# Patient Record
Sex: Female | Born: 1935 | Race: Black or African American | Hispanic: No | Smoking: Current some day smoker
Health system: Southern US, Community
[De-identification: ages and names within clinical notes are randomized; demographics above are authoritative.]

## PROBLEM LIST (undated history)

## (undated) DIAGNOSIS — J449 Chronic obstructive pulmonary disease, unspecified: Secondary | ICD-10-CM

## (undated) DIAGNOSIS — I2510 Atherosclerotic heart disease of native coronary artery without angina pectoris: Secondary | ICD-10-CM

## (undated) DIAGNOSIS — D649 Anemia, unspecified: Secondary | ICD-10-CM

## (undated) DIAGNOSIS — I10 Essential (primary) hypertension: Secondary | ICD-10-CM

## (undated) DIAGNOSIS — Z950 Presence of cardiac pacemaker: Secondary | ICD-10-CM

## (undated) DIAGNOSIS — C3490 Malignant neoplasm of unspecified part of unspecified bronchus or lung: Secondary | ICD-10-CM

## (undated) DIAGNOSIS — I5022 Chronic systolic (congestive) heart failure: Secondary | ICD-10-CM

## (undated) DIAGNOSIS — C801 Malignant (primary) neoplasm, unspecified: Secondary | ICD-10-CM

## (undated) DIAGNOSIS — I480 Paroxysmal atrial fibrillation: Secondary | ICD-10-CM

## (undated) DIAGNOSIS — E039 Hypothyroidism, unspecified: Secondary | ICD-10-CM

## (undated) DIAGNOSIS — E119 Type 2 diabetes mellitus without complications: Secondary | ICD-10-CM

## (undated) DIAGNOSIS — R9431 Abnormal electrocardiogram [ECG] [EKG]: Secondary | ICD-10-CM

## (undated) HISTORY — DX: Abnormal electrocardiogram (ECG) (EKG): R94.31

## (undated) HISTORY — PX: PACEMAKER GENERATOR CHANGE: SHX5998

## (undated) HISTORY — DX: Chronic systolic (congestive) heart failure: I50.22

## (undated) HISTORY — PX: HERNIA REPAIR: SHX51

---

## 1988-10-01 HISTORY — PX: CORONARY ANGIOPLASTY WITH STENT PLACEMENT: SHX49

## 1989-10-01 HISTORY — PX: PACEMAKER INSERTION: SHX728

## 1998-01-13 ENCOUNTER — Ambulatory Visit (HOSPITAL_COMMUNITY): Admission: RE | Admit: 1998-01-13 | Discharge: 1998-01-13 | Payer: Self-pay | Admitting: Internal Medicine

## 1998-03-07 ENCOUNTER — Inpatient Hospital Stay (HOSPITAL_COMMUNITY): Admission: EM | Admit: 1998-03-07 | Discharge: 1998-03-11 | Payer: Self-pay | Admitting: Emergency Medicine

## 1998-04-05 ENCOUNTER — Encounter: Admission: RE | Admit: 1998-04-05 | Discharge: 1998-04-05 | Payer: Self-pay | Admitting: Obstetrics & Gynecology

## 1998-04-05 ENCOUNTER — Other Ambulatory Visit: Admission: RE | Admit: 1998-04-05 | Discharge: 1998-04-05 | Payer: Self-pay | Admitting: Obstetrics

## 1998-04-19 ENCOUNTER — Other Ambulatory Visit: Admission: RE | Admit: 1998-04-19 | Discharge: 1998-04-19 | Payer: Self-pay | Admitting: Obstetrics

## 1998-04-19 ENCOUNTER — Encounter: Admission: RE | Admit: 1998-04-19 | Discharge: 1998-04-19 | Payer: Self-pay | Admitting: Obstetrics & Gynecology

## 1998-07-18 ENCOUNTER — Ambulatory Visit (HOSPITAL_COMMUNITY): Admission: RE | Admit: 1998-07-18 | Discharge: 1998-07-18 | Payer: Self-pay | Admitting: Internal Medicine

## 1998-11-14 ENCOUNTER — Other Ambulatory Visit: Admission: RE | Admit: 1998-11-14 | Discharge: 1998-11-14 | Payer: Self-pay

## 1998-11-21 ENCOUNTER — Ambulatory Visit (HOSPITAL_COMMUNITY): Admission: RE | Admit: 1998-11-21 | Discharge: 1998-11-21 | Payer: Self-pay | Admitting: *Deleted

## 1998-12-20 ENCOUNTER — Encounter: Payer: Self-pay | Admitting: Obstetrics and Gynecology

## 1998-12-22 ENCOUNTER — Ambulatory Visit (HOSPITAL_COMMUNITY): Admission: RE | Admit: 1998-12-22 | Discharge: 1998-12-22 | Payer: Self-pay | Admitting: Obstetrics and Gynecology

## 1999-08-02 ENCOUNTER — Encounter: Admission: RE | Admit: 1999-08-02 | Discharge: 1999-10-31 | Payer: Self-pay | Admitting: Internal Medicine

## 2000-04-18 ENCOUNTER — Emergency Department (HOSPITAL_COMMUNITY): Admission: EM | Admit: 2000-04-18 | Discharge: 2000-04-18 | Payer: Self-pay | Admitting: Emergency Medicine

## 2000-04-18 ENCOUNTER — Encounter: Payer: Self-pay | Admitting: Emergency Medicine

## 2001-04-14 ENCOUNTER — Other Ambulatory Visit: Admission: RE | Admit: 2001-04-14 | Discharge: 2001-04-14 | Payer: Self-pay | Admitting: Internal Medicine

## 2001-12-17 ENCOUNTER — Encounter: Payer: Self-pay | Admitting: Internal Medicine

## 2001-12-17 ENCOUNTER — Encounter: Admission: RE | Admit: 2001-12-17 | Discharge: 2001-12-17 | Payer: Self-pay | Admitting: Internal Medicine

## 2002-03-26 ENCOUNTER — Inpatient Hospital Stay (HOSPITAL_COMMUNITY): Admission: EM | Admit: 2002-03-26 | Discharge: 2002-04-01 | Payer: Self-pay | Admitting: Emergency Medicine

## 2002-03-26 ENCOUNTER — Encounter: Payer: Self-pay | Admitting: Emergency Medicine

## 2002-03-26 ENCOUNTER — Encounter (INDEPENDENT_AMBULATORY_CARE_PROVIDER_SITE_OTHER): Payer: Self-pay | Admitting: Specialist

## 2002-03-26 ENCOUNTER — Encounter: Payer: Self-pay | Admitting: Surgery

## 2002-03-28 ENCOUNTER — Encounter: Payer: Self-pay | Admitting: Surgery

## 2002-09-14 ENCOUNTER — Encounter: Payer: Self-pay | Admitting: Emergency Medicine

## 2002-09-14 ENCOUNTER — Inpatient Hospital Stay (HOSPITAL_COMMUNITY): Admission: EM | Admit: 2002-09-14 | Discharge: 2002-09-16 | Payer: Self-pay | Admitting: Emergency Medicine

## 2002-10-09 ENCOUNTER — Ambulatory Visit (HOSPITAL_COMMUNITY): Admission: RE | Admit: 2002-10-09 | Discharge: 2002-10-09 | Payer: Self-pay | Admitting: Cardiology

## 2002-11-24 ENCOUNTER — Ambulatory Visit (HOSPITAL_COMMUNITY): Admission: RE | Admit: 2002-11-24 | Discharge: 2002-11-24 | Payer: Self-pay | Admitting: General Surgery

## 2002-12-25 ENCOUNTER — Inpatient Hospital Stay (HOSPITAL_COMMUNITY): Admission: RE | Admit: 2002-12-25 | Discharge: 2002-12-27 | Payer: Self-pay | Admitting: General Surgery

## 2002-12-26 ENCOUNTER — Encounter: Payer: Self-pay | Admitting: General Surgery

## 2003-12-01 ENCOUNTER — Observation Stay (HOSPITAL_COMMUNITY): Admission: EM | Admit: 2003-12-01 | Discharge: 2003-12-02 | Payer: Self-pay | Admitting: Emergency Medicine

## 2005-07-28 ENCOUNTER — Emergency Department (HOSPITAL_COMMUNITY): Admission: EM | Admit: 2005-07-28 | Discharge: 2005-07-28 | Payer: Self-pay | Admitting: Family Medicine

## 2005-10-19 ENCOUNTER — Emergency Department (HOSPITAL_COMMUNITY): Admission: EM | Admit: 2005-10-19 | Discharge: 2005-10-19 | Payer: Self-pay | Admitting: Family Medicine

## 2006-04-30 ENCOUNTER — Encounter: Admission: RE | Admit: 2006-04-30 | Discharge: 2006-04-30 | Payer: Self-pay | Admitting: Internal Medicine

## 2006-08-30 ENCOUNTER — Emergency Department (HOSPITAL_COMMUNITY): Admission: EM | Admit: 2006-08-30 | Discharge: 2006-08-30 | Payer: Self-pay | Admitting: Family Medicine

## 2007-10-22 ENCOUNTER — Encounter: Admission: RE | Admit: 2007-10-22 | Discharge: 2007-10-22 | Payer: Self-pay | Admitting: Internal Medicine

## 2007-10-28 ENCOUNTER — Encounter: Admission: RE | Admit: 2007-10-28 | Discharge: 2007-10-28 | Payer: Self-pay | Admitting: Internal Medicine

## 2007-10-31 ENCOUNTER — Inpatient Hospital Stay (HOSPITAL_COMMUNITY): Admission: EM | Admit: 2007-10-31 | Discharge: 2007-11-04 | Payer: Self-pay | Admitting: Emergency Medicine

## 2008-12-09 ENCOUNTER — Encounter: Admission: RE | Admit: 2008-12-09 | Discharge: 2008-12-09 | Payer: Self-pay | Admitting: Internal Medicine

## 2009-01-22 ENCOUNTER — Emergency Department (HOSPITAL_COMMUNITY): Admission: EM | Admit: 2009-01-22 | Discharge: 2009-01-22 | Payer: Self-pay | Admitting: Family Medicine

## 2009-05-13 ENCOUNTER — Encounter: Admission: RE | Admit: 2009-05-13 | Discharge: 2009-05-13 | Payer: Self-pay | Admitting: Internal Medicine

## 2009-10-31 ENCOUNTER — Emergency Department (HOSPITAL_COMMUNITY): Admission: EM | Admit: 2009-10-31 | Discharge: 2009-10-31 | Payer: Self-pay | Admitting: Family Medicine

## 2010-03-11 ENCOUNTER — Emergency Department (HOSPITAL_COMMUNITY): Admission: EM | Admit: 2010-03-11 | Discharge: 2010-03-11 | Payer: Self-pay | Admitting: Emergency Medicine

## 2010-03-12 ENCOUNTER — Observation Stay (HOSPITAL_COMMUNITY): Admission: EM | Admit: 2010-03-12 | Discharge: 2010-03-14 | Payer: Self-pay | Admitting: Emergency Medicine

## 2010-03-23 ENCOUNTER — Ambulatory Visit: Payer: Self-pay | Admitting: Pulmonary Disease

## 2010-03-23 DIAGNOSIS — J984 Other disorders of lung: Secondary | ICD-10-CM | POA: Insufficient documentation

## 2010-03-23 DIAGNOSIS — E039 Hypothyroidism, unspecified: Secondary | ICD-10-CM | POA: Insufficient documentation

## 2010-03-23 DIAGNOSIS — I2510 Atherosclerotic heart disease of native coronary artery without angina pectoris: Secondary | ICD-10-CM | POA: Insufficient documentation

## 2010-03-23 DIAGNOSIS — I4821 Permanent atrial fibrillation: Secondary | ICD-10-CM

## 2010-03-23 DIAGNOSIS — I480 Paroxysmal atrial fibrillation: Secondary | ICD-10-CM | POA: Insufficient documentation

## 2010-03-23 DIAGNOSIS — K219 Gastro-esophageal reflux disease without esophagitis: Secondary | ICD-10-CM | POA: Insufficient documentation

## 2010-03-23 DIAGNOSIS — J449 Chronic obstructive pulmonary disease, unspecified: Secondary | ICD-10-CM | POA: Insufficient documentation

## 2010-03-23 HISTORY — DX: Hypothyroidism, unspecified: E03.9

## 2010-03-23 HISTORY — DX: Paroxysmal atrial fibrillation: I48.0

## 2010-03-23 HISTORY — DX: Permanent atrial fibrillation: I48.21

## 2010-06-26 ENCOUNTER — Emergency Department (HOSPITAL_COMMUNITY): Admission: EM | Admit: 2010-06-26 | Discharge: 2010-06-26 | Payer: Self-pay | Admitting: Emergency Medicine

## 2010-07-13 ENCOUNTER — Ambulatory Visit: Payer: Self-pay | Admitting: Internal Medicine

## 2010-10-01 HISTORY — PX: CHOLECYSTECTOMY: SHX55

## 2010-10-22 ENCOUNTER — Encounter: Payer: Self-pay | Admitting: Internal Medicine

## 2010-11-02 NOTE — Assessment & Plan Note (Signed)
Summary: [facility]   Visit Type:  Initial Consult Copy to:  [place], ED   History of Present Illness: 74/F, smoker for evaluation of pulmonary nodules. She presented to [facility] twice with left axillary chest pain, heavy , non radiating. She had angioplasty in '97 & reported pain ot be similar to then.On second occassion admitted ,cath neg, Ct angio showed moderate emphysema & 4mm nodule in the anterior RUL & 5 mm nodule in RML.  She denies fecvers, wt loss, loss of appetite. Per daughter, she is dyspneic on walking small distances.She reports a chest cold last year requirign nebuliser Rx & steroid shot. She ahs stsarted on chantix & cut down to 4 cigs /d ay int he last week. Meds reviewed  Preventive Screening-Counseling & Management  Alcohol-Tobacco     Smoking Status: current     Smoking Cessation Counseling: yes     Smoke Cessation Stage: contemplative     Packs/Day: 1.0  Problems Prior to Update: 1)  Hypothyroidism  (ICD-244.9) 2)  G E R D  (ICD-530.81) 3)  Diabetes, Type 2  (ICD-250.00) 4)  Coronary Heart Disease  (ICD-414.00) 5)  Hx of Atrial Fibrillation  (ICD-427.31)  Current Medications (verified): 1)  None  Allergies (verified): No Known Drug Allergies    Past History:  Past Medical History: HYPOTHYROIDISM (ICD-244.9) G E R D (ICD-530.81) DIABETES, TYPE 2 (ICD-250.00) CORONARY HEART DISEASE (ICD-414.00) Hx of ATRIAL FIBRILLATION (ICD-427.31)  Past Surgical History: Cholecystectomy Pacemaker:  hernia repair  Family History: Family History Coronary Heart Disease  Social History: Patient is a current smoker.  lives wih daughter  denies ETOH or drug useSmoking Status:  current Packs/Day:  1.0  Review of Systems       The patient complains of shortness of breath with activity, non-productive cough, and chest pain.  The patient denies shortness of breath at rest, productive cough, coughing up blood, irregular heartbeats, acid heartburn, indigestion,  loss of appetite, weight change, abdominal pain, difficulty swallowing, sore throat, tooth/dental problems, headaches, nasal congestion/difficulty breathing through nose, sneezing, itching, ear ache, anxiety, depression, hand/feet swelling, joint stiffness or pain, rash, change in color of mucus, and fever.    Vital Signs:  Patient profile:   76 year old female Height:      62 inches Weight:      147 pounds BMI:     26.98 O2 Sat:      96 % on Room air Pulse rate:   82 / minute Pulse rhythm:   regular Resp:     18 per minute BP sitting:   138 / 77  O2 Flow:  Room air  Physical Exam  Additional Exam:  Gen. Pleasant, well-nourished, in no distress, normal affect ENT - no lesions, no post nasal drip Neck: No JVD, no thyromegaly, no carotid bruits Lungs: no use of accessory muscles, no dullness to percussion, clear without rales or rhonchi  Cardiovascular: Rhythm regular, heart sounds  normal, no murmurs or gallops, no peripheral edema Abdomen: soft and non-tender, no hepatosplenomegaly, BS normal. Musculoskeletal: No deformities, no cyanosis or clubbing Neuro:  alert, non focal     Impression & Recommendations:  Problem # 1:  PULMONARY NODULE, RIGHT UPPER LOBE (ICD-518.89)  discussed need for serial FU x 2 years or until resolution. Next CT in 4 mnths  Orders: Radiology Referral (Radiology) Consultation Level IV (16109)  Problem # 2:  C O P D (ICD-496) PFTs   Problem # 3:  TOBACCO ABUSE (ICD-305.1)  clearly smoking cessation  paramount here - has started on chantix & cut down to 4 cigs/ day. encouraged her on the quit attempt  Orders: Consultation Level IV (16109)  Other Orders: Pulmonary Referral (Pulmonary)  Patient Instructions: 1)  Copy sent to:Dr Hank smith 2)  You have been asked to make an appointment for a Pulmonary Function Test (Breathing Test) prior to or at the time of your next visit. Use medications as usual unless otherwise instructed. 3)  A Chest CT  WITHOUT Contrast has been recommended. Your imaging study may require preauthorization.

## 2010-12-18 LAB — POCT I-STAT, CHEM 8
BUN: 20 mg/dL (ref 6–23)
Chloride: 107 mEq/L (ref 96–112)
Creatinine, Ser: 0.7 mg/dL (ref 0.4–1.2)
Creatinine, Ser: 0.7 mg/dL (ref 0.4–1.2)
Glucose, Bld: 157 mg/dL — ABNORMAL HIGH (ref 70–99)
Hemoglobin: 12.9 g/dL (ref 12.0–15.0)
Potassium: 3.9 mEq/L (ref 3.5–5.1)
Sodium: 141 mEq/L (ref 135–145)
Sodium: 142 mEq/L (ref 135–145)
TCO2: 26 mmol/L (ref 0–100)

## 2010-12-18 LAB — COMPREHENSIVE METABOLIC PANEL
ALT: 28 U/L (ref 0–35)
AST: 22 U/L (ref 0–37)
Alkaline Phosphatase: 89 U/L (ref 39–117)
BUN: 13 mg/dL (ref 6–23)
CO2: 27 mEq/L (ref 19–32)
Calcium: 9 mg/dL (ref 8.4–10.5)
Chloride: 108 mEq/L (ref 96–112)
GFR calc Af Amer: >60 mL/min (ref >60)
GFR calc non Af Amer: >60 mL/min (ref >60)
Glucose, Bld: 118 mg/dL — ABNORMAL HIGH (ref 70–99)
Glucose, Bld: 153 mg/dL — ABNORMAL HIGH (ref 70–99)
Potassium: 3.9 mEq/L (ref 3.5–5.1)
Sodium: 140 mEq/L (ref 135–145)
Total Bilirubin: 0.3 mg/dL (ref 0.3–1.2)
Total Protein: 6.4 g/dL (ref 6.0–8.3)

## 2010-12-18 LAB — BASIC METABOLIC PANEL
BUN: 13 mg/dL (ref 6–23)
BUN: 15 mg/dL (ref 6–23)
Calcium: 9.5 mg/dL (ref 8.4–10.5)
Calcium: 9.6 mg/dL (ref 8.4–10.5)
GFR calc non Af Amer: >60 mL/min (ref >60)
GFR calc non Af Amer: >60 mL/min (ref >60)
Glucose, Bld: 142 mg/dL — ABNORMAL HIGH (ref 70–99)
Glucose, Bld: 208 mg/dL — ABNORMAL HIGH (ref 70–99)
Potassium: 3.9 mEq/L (ref 3.5–5.1)
Potassium: 3.9 mEq/L (ref 3.5–5.1)
Sodium: 138 mEq/L (ref 135–145)
Sodium: 140 mEq/L (ref 135–145)

## 2010-12-18 LAB — POCT CARDIAC MARKERS
CKMB, poc: 3.3 ng/mL (ref 1.0–8.0)
CKMB, poc: 3.7 ng/mL (ref 1.0–8.0)
Myoglobin, poc: 210 ng/mL (ref 12–200)
Myoglobin, poc: 88.7 ng/mL (ref 12–200)
Myoglobin, poc: 98.1 ng/mL (ref 12–200)
Troponin i, poc: <0.05 ng/mL (ref 0.00–0.09)

## 2010-12-18 LAB — CBC
HCT: 36 % (ref 36.0–46.0)
Hemoglobin: 12.4 g/dL (ref 12.0–15.0)
MCHC: 34 g/dL (ref 30.0–36.0)
MCHC: 34 g/dL (ref 30.0–36.0)
MCV: 89.2 fL (ref 78.0–100.0)
Platelets: 175 10*3/uL (ref 150–400)
Platelets: 195 10*3/uL (ref 150–400)
RBC: 4.11 MIL/uL (ref 3.87–5.11)
RDW: 14.3 % (ref 11.5–15.5)
RDW: 14.4 % (ref 11.5–15.5)
WBC: 4.8 10*3/uL (ref 4.0–10.5)
WBC: 5.3 10*3/uL (ref 4.0–10.5)
WBC: 6.1 10*3/uL (ref 4.0–10.5)

## 2010-12-18 LAB — CK TOTAL AND CKMB (NOT AT ARMC)
CK, MB: 3.9 ng/mL (ref 0.3–4.0)
Total CK: 131 U/L (ref 7–177)

## 2010-12-18 LAB — LIPID PANEL
Cholesterol: 190 mg/dL (ref 0–200)
HDL: 45 mg/dL (ref >39)

## 2010-12-18 LAB — CARDIAC PANEL(CRET KIN+CKTOT+MB+TROPI)
CK, MB: 3.4 ng/mL (ref 0.3–4.0)
Relative Index: 3.1 — ABNORMAL HIGH (ref 0.0–2.5)
Relative Index: 3.2 — ABNORMAL HIGH (ref 0.0–2.5)
Total CK: 108 U/L (ref 7–177)

## 2010-12-18 LAB — GLUCOSE, CAPILLARY
Glucose-Capillary: 119 mg/dL — ABNORMAL HIGH (ref 70–99)
Glucose-Capillary: 126 mg/dL — ABNORMAL HIGH (ref 70–99)
Glucose-Capillary: 131 mg/dL — ABNORMAL HIGH (ref 70–99)
Glucose-Capillary: 131 mg/dL — ABNORMAL HIGH (ref 70–99)
Glucose-Capillary: 140 mg/dL — ABNORMAL HIGH (ref 70–99)
Glucose-Capillary: 265 mg/dL — ABNORMAL HIGH (ref 70–99)

## 2010-12-18 LAB — HEMOGLOBIN A1C
Hgb A1c MFr Bld: 8 % — ABNORMAL HIGH (ref <5.7)
Mean Plasma Glucose: 183 mg/dL — ABNORMAL HIGH (ref <117)

## 2010-12-18 LAB — DIFFERENTIAL
Basophils Absolute: 0 10*3/uL (ref 0.0–0.1)
Basophils Relative: 1 % (ref 0–1)
Eosinophils Absolute: 0.1 10*3/uL (ref 0.0–0.7)
Eosinophils Relative: 2 % (ref 0–5)
Lymphocytes Relative: 44 % (ref 12–46)
Lymphs Abs: 2.3 10*3/uL (ref 0.7–4.0)
Lymphs Abs: 2.7 10*3/uL (ref 0.7–4.0)
Neutro Abs: 2.6 10*3/uL (ref 1.7–7.7)

## 2010-12-18 LAB — PROTIME-INR: INR: 0.93 (ref 0.00–1.49)

## 2010-12-18 LAB — APTT: aPTT: 32 seconds (ref 24–37)

## 2011-02-13 NOTE — H&P (Signed)
NAMEFOYE, HAGGART NO.:  [id number]   MEDICAL RECORD NO.:  [id number]          PATIENT TYPE:  EMS   LOCATION:  MAJO                         FACILITY:  [facility]   PHYSICIAN:  Francisca December, M.D.  DATE OF BIRTH:  09-22-1935   DATE OF ADMISSION:  10/31/2007  DATE OF DISCHARGE:                              HISTORY & PHYSICAL   REASON FOR ADMISSION:  Chest heaviness.   HISTORY OF PRESENT ILLNESS:  Ms. Brott is a 76 year old African-  American woman with a history of CAD who developed spontaneous anterior  substernal chest heaviness today around 1400.  There was some associated  shortness of breath, and there has been some radiation into the left  neck but no diaphoresis or nausea.  It is reminiscent of previous  coronary ischemic pain.  She had a stent placed in the left circumflex  December 01, 2003,  associated with chest discomfort but no myocardial  infarction.  She came via EMS to the ER still experiencing the chest  discomfort at the time of my evaluation at 1645.  She was not given any  medications en route and did not take her nitroglycerin, although she  does have some at home.  A single nitroglycerin in the emergency room  did resolve her  chest discomfort.   PAST MEDICAL HISTORY:  1. Bronchitis with productive cough, treated with antibiotics x1,      probably Z-Pak, since November 2008.  2. History of CAD, status post Cypher stent placement March 2005.  3. PTCA in 1997 with NSTEMI at that time.  4. History of atrial fibrillation.  5. Status post permanent pacemaker insertion.  6. GERD.  7. Diabetes mellitus, oral agent.  8. Hypothyroidism.   CURRENT MEDICATIONS:  Avandia, glipizide, Lanoxin, metoprolol, Nexium,  Zetia, aspirin and Synthroid.  Doses not available.   DRUG ALLERGIES:  None known.   SOCIAL HISTORY:  She lives independently here in [place], a native of  [place].  Her son and grandson live with her.  She smokes about a  half pack  of cigarettes daily.  No ethanol.   FAMILY HISTORY:  Noncontributory.   REVIEW OF SYSTEMS:  All negative except for the productive cough as  mentioned above.   PHYSICAL EXAMINATION:  VITAL SIGNS:  Blood pressure is 130/70, the pulse  is 73 and regular, respiratory rate 20, temperature 97.8, O2 saturation  on 2 L 94%.  GENERAL:  This is a pleasant, alert, oriented 76 year old woman in no  distress, well-kept.  HEENT:  Unremarkable.  The head is atraumatic and normocephalic.  Pupils  are equal, round and reactive to light.  Sclerae are anicteric.  Oral  mucosa is pink and moist.  Teeth and gums are in poor repair.  NECK:  Supple without thyromegaly or masses.  The carotid upstrokes are  normal without bruit.  No JVD.  CHEST:  Clear on the left, decreased breath sounds mid to the base on  the right.  HEART:  The heart has a slightly irregular rhythm.  Normal S1 and S2 are  heard.  No  murmur, click or rub.  ABDOMEN:  Soft, nontender.  No midline pulsatile mass.  EXTREMITIES:  Lower extremities no edema.  Pedal pulses are not  palpable.  NEUROLOGIC:  Cranial nerves II-XII are intact.  Motor and sensory  grossly intact.  Gait not tested.  SKIN:  Warm, dry and clear.   Electrocardiogram on presentation shows a demand ventricular pacing and  what appears to be atrial fibrillation with 1.5 mm of ST-segment  depression, V2 to V4, and T-wave inversion, V1 to V5.  ECG subsequent  with resolution of discomfort shows atrial pacing,  ventricular sensing  and persistent T-wave inversion in V2, V3, now upright V4, 5 and 6 with  resolution of ST-segment depression.   Chest x-ray shows no infiltrates despite the physical findings above or  effusion and no significant cardiac enlargement.   IMPRESSION:  1. Unstable angina pectoris, rule out non-ST-segment elevation      myocardial infarction.  2. History of significant coronary disease dating to 1997 as described      above.  3. Diabetes  mellitus, oral agent.  4. Gastroesophageal reflux disease.  5. Hypothyroidism.  6. 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  No      records available on this.   PLAN:  1. We will admit the patient to telemetry for monitoring of heart rate      continuous.  2. Begin IV nitroglycerin at 3 mL an hour.  3. Subcutaneous Lovenox initial dose 80 mg.  4. Aspirin 325 mg now and daily.  5. Continue beta blocker.  6. CK-MB, troponin enzymes x3 q.8 h.  7. The patient will be NPO after midnight February 1 with plans for      cardiac catheterization, Dr. Lyn Records on November 03, 2007.      Francisca December, M.D.  Electronically Signed     JHE/MEDQ  D:  10/31/2007  T:  11/01/2007  Job:  [id number]   cc:   Lyn Records, M.D.

## 2011-02-13 NOTE — Cardiovascular Report (Signed)
NAMERUFINA, KIMERY NO.:  [id number]   MEDICAL RECORD NO.:  [id number]          PATIENT TYPE:  INP   LOCATION:  2007                         FACILITY:  [facility]   PHYSICIAN:  Lyn Records, M.D.   DATE OF BIRTH:  [birth year]   DATE OF PROCEDURE:  DATE OF DISCHARGE:                            CARDIAC CATHETERIZATION   REASON FOR PROCEDURE:  1. Unstable angina.  2. History of coronary artery disease.  3. History of prior left circumflex stent.   PROCEDURE PERFORMED:  1. Left heart catheterization.  2. Selective coronary angiography.  3. Left ventriculorrhaphy.   DESCRIPTION:  After informed consent, a 6-French sheath was placed in  the right femoral artery using modified Seldinger technique.  We then  used a 6-French  A2 multipurpose catheter for hemodynamic recordings,  left ventriculography by hand injection, selective coronary angiography.  We were unable to selectively engage the left coronary with this  catheter.  A 6-French #4 left Judkins catheter was used for that vessel.  Manual compression was used to achieve  hemostasis.   RESULTS:  1. Hemodynamic data:      a.     Aortic pressure 115/54.      b.     Left ventricular pressure 119/4.  2. Left ventriculography:  The left ventricle is normal in size and      exhibits normal contractility.  The ejection fraction is estimated      to be 60%.  3. Coronary angiography:      a.     Left main coronary:  Moderate calcification is noted.  No       significant obstruction is seen.      b.     Left anterior descending coronary:  LAD is a vessel that       contains moderate luminal irregularities throughout its proximal       and mid course with up to 50% narrowing in the proximal vessel       just after the origin of first septal perforator and the first       diagonal.  No high-grade obstruction is noted.  First diagonal       contains eccentric 50% stenoses and the ostium may be more       significantly  involved then by estimate, however, I was unable to       document high-grade obstruction in the diagonal.      c.     Circumflex artery:  The circumflex coronary artery gives       origin to obtuse marginal branches.  The first obtuse marginal       branch is large and bifurcated.  The second obtuse marginal is       relatively small, less than 2.25 mm in diameter.  The stent in the       proximal circumflex is wildly patent.  The second obtuse marginal       contains an eccentric 70% to 80% stenosis within very small       branch.      d.  Right coronary:  The right coronary artery contain luminal       irregularities proximal, mid and distal up to 50% narrowing.  The       PDA contains luminal irregularities up to 50% proximally.  No high-       grade obstruction is felt to be present in the right coronary.   CONCLUSION:  1. Moderate coronary disease with 70% second obtuse marginal, 50% to      70% first diagonal and possibly more severe at the ostium of first      diagonal which was never laid out very well.  There is mild to      moderate proximal mid RCA and LAD disease.  2. Normal LV function.  3. Widely patent stent in the proximal circumflex.   PLAN:  1. Medical therapy including addition of a statin and thienopyridine      therapy for at least a month to treat possible plaque rupture.  The      patient continues to have chest discomfort, perhaps myocardial      perfusion study or pressure wire will need to be done in the      diagonal territory.      Lyn Records, M.D.  Electronically Signed     HWS/MEDQ  D:  11/03/2007  T:  11/03/2007  Job:  [id number]   cc:   [facility]

## 2011-02-16 NOTE — H&P (Signed)
[name], [name] NO.:  [id number]   MEDICAL RECORD NO.:  [id number]                   PATIENT TYPE:  INP   LOCATION:  6527                                 FACILITY:  [facility]   PHYSICIAN:  Lyn Records, M.D.                DATE OF BIRTH:  Jan 24, 1935   DATE OF ADMISSION:  09/14/2002  DATE OF DISCHARGE:  09/16/2002                                HISTORY & PHYSICAL   IMPRESSION (AS DICTATED BY DR. [facility]):  1. Chest/shoulder/neck discomfort; anterior chest discomfort versus unstable     angina pectoris in a 76 year old diabetic obese female with known history     of myocardial infarction, prior percutaneous transluminal coronary     angioplasty posterior descending artery approximately 5 years earlier.     Her EKG is unhelpful as she is in an AV sequential pacemaker rhythm.     First set of cardiac enzymes are negative though her chest x-ray shows     mild congestive heart failure.  2. Diabetes mellitus.  3. Dyslipidemia on Zetia.  4. Mild hypokalemia with serum potassium of 3.6.  5. Ongoing tobacco abuse lifelong smoker.   PLAN (AS DICTATED BY DR. [facility]):  1. Admit to telemetry, rule out MI protocol, serial cardiac enzymes.  2. Topical nitroglycerin paste.  3. Supplement potassium 40 mEq.  Recheck in the morning.  4. The patient consult undergone with accepted plan for coronary angiography     with possible percutaneous intervention if indicated and able.  Risks,     potential complications, benefits and alternatives to procedure were     discussed in detail.  The patient indicates her questions and concerns     were addressed and is agreeable to proceed.   HISTORY OF PRESENT ILLNESS:  The patient is a pleasant 76 year old diabetic,  obese female with ongoing tobacco abuse, history of dyslipidemia who woke at  11 a.m. with right neck/shoulder pain, right arm numbness which was  reminiscent of her presentation for MI back 5 years  earlier.  She had no  associated diaphoresis nor nausea though she did feel mildly short of  breath.  Later on in the afternoon about 1:30 she developed anterior chest  heaviness.  She called 911.  EMS was summoned who administered an additional  two sublingual nitroglycerin and four baby aspirin with resolution of chest  discomfort.  She continues with the right neck/shoulder pain.   PREVIOUS MEDICAL HISTORY:  1. Coronary atherosclerotic heart disease:     a. History of inferior myocardial infarction (preceding pericarditis        question related to procainamide).     b. (July 1998) PTCA of PDA residual 30-40% proximal LAD before diagonal        2; 30-50% of a mid OM branch.  A 50% proximal RCA, 20-30% distal RCA.  EF of 55% without MR.     c. Adenosine-Cardiolite June 1999 which was negative for ischemia.  She        had another recent Adenosine-Cardiolite March 28, 2002 which was        negative for evidence of infarction or ischemia.  Normal wall motion        with EF of 61%.  2. History of tachy-brady syndrome with subsequent permanent transvenous     pacemaker.  The generator was changed out June 1999 by Dr. Corliss Marcus.     She is pacemaker dependent (revealed 99% AV paced).  The patient is     followed in our pacemaker clinic.  Her generator is approaching end-of-     life (3 months of battery life left).  3. Diabetes mellitus onset June 1999.  4. Hypothyroidism; supplemented.  5. Question history of hypertension.  6. Dyslipidemia.  7. History of CHF.  8. History of syncope secondary to pacemaker malformation June 1999.  9. History of GERD on Nexium.  10.      History of Graves disease treated with radioactive iodine in 1992     with subsequent hypothyroidism.  11.      Biliary pancreatitis (June 2003) with subsequent laparoscopic     cholecystectomy (June 2003) Dr. Zachery Dakins.   PREVIOUS SURGICAL HISTORY:  1. Cholecystectomy, ventral hernia repair June 2003 at  [facility]     by Dr. Zachery Dakins.  2. Pacemaker implantation with subsequent generator change-out June 1999.   ALLERGIES:  No known drug allergies.  Okay with seafood, shellfish, and  ______ products.   MEDICATIONS:  1. Avandia 4 mg p.o. q.d.  2. Lanoxin 0.25 mg p.o. q.d.  3. Glucotrol 10 mg p.o. q.d.  4. Zetia 10 mg p.o. q.d.  5. Synthroid 0.112 mg p.o. q.d.  6. Lopressor 100 mg one half tab p.o. b.i.d.  7. Nexium 40 mg p.o. q.d.  8. Nitroglycerin 0.4 sublingual p.r.n. chest pain.  9. Enteric-coated aspirin 325 mg p.o. q.d.   SOCIAL AND HABITS:  1. The patient is widowed since March 2003.  She has eight children; one     died of crib death, one died at age 29 of carcinoma.  She has always     worked as a Futures trader.  She currently lives with her daughter and son-in-     Social worker.  She is the primary care Akshaj Besancon as well for her elderly father who     also lives in their home.  2. Tobacco: Lifelong use one pack per day for 50 years.  3. ETOH: Very rare social.   FAMILY HISTORY:  Father is age 17 and has a pacemaker.  Mother died of an MI  at age 60.  Brother died at age 53 of liver cirrhosis setting of ETOH abuse.   REVIEW OF SYSTEMS:  The patient has been complaining of a productive cough,  yellow sputum; she has just completed Z-Pak approximately 5 days earlier.  She is complaining of cold symptoms since mid November.  She wears  corrective lenses for reading, has upper/lower dentures, otherwise review of  systems benign.  This includes GI/GU/arthritic review.   PHYSICAL EXAMINATION (AS PERFORMED BY DR. HENRY SMITH):  VITAL SIGNS:  Blood  pressure 140/70, heart rate 70.  She is afebrile.  HEAD/EYES/EARS/NOSE/THROAT:  Brisk bilateral carotid upstroke without bruit.  Neck is without JVD; no thyromegaly.  CHEST:  Lung sounds clear with no CPA tenderness.  CARDIAC:  Negative for  murmur, rub, or gallop.  Normal S1 and S2. ABDOMEN:  Soft, nondistended, normoactive bowel  sounds.  Negative abdominal  aortic, renal and femoral bruit.  Nontender to applied pressure; no masses  no organomegaly appreciated.  EXTREMITIES:  Distal pulse intact; negative pedal edema.  NEUROLOGIC:  Grossly nonfocal.   LABORATORY TESTS AND DATA:  Sodium 142, potassium 3.6, chloride 110, CO2 25,  BUN 11, creatinine 0.8, glucose 151.  Hemoglobin 12.5, hematocrit of 36.8,  WBC 5.8, platelets 251, differential within normal range.  Prothrombin time  of 13, INR of 1, PTT of 36.  Chest x-ray revealed vascular congestion, mild  pulmonary edema.  EKG revealed AV sequential pacemaker at 60 beats per  minute.  First CK is 168, MB fraction 3.9, troponin I 0.03.     Salomon Fick, N.P.                       Lyn Records, M.D.    MES/MEDQ  D:  09/16/2002  T:  09/16/2002  Job:  [id number]   cc:   Candyce Churn. Allyne Gee, M.D.  [phone] [place]  [place] [address]  Fax: [phone]

## 2011-02-16 NOTE — Discharge Summary (Signed)
[facility]  Patient:    [name], [name] Visit Number: [id number] MRN: [id number]          Service Type: MED Location: 3W [phone] Attending Physician:  Bonnetta Barry Dictated by:   Anselm Pancoast. Zachery Dakins, M.D. Admit Date:  03/26/2002 Discharge Date: 04/01/2002   CC:         Velna Hatchet, M.D.  Garnette Scheuermann, M.D.   Discharge Summary  DISCHARGE DIAGNOSES: 1. Chronic cholecystitis with passage of common duct stone and pancreatitis. 2. Coronary artery disease, status post angioplasty with pacemaker. 3. Non-insulin-dependent diabetes mellitus.  CONSULTATIONS:  Cardiology, Dr. Garnette Scheuermann.  PRIMARY CARE PHYSICIAN:  Dr. Velna Hatchet.  HISTORY OF PRESENT ILLNESS:  [name] is a 76 year old female admitted to the emergency room with a 24 hour history of abdominal pain primarily in the epigastric area.  She denied nausea, vomiting, fever, or chills, and on examination she was mildly tender.  On laboratory studies her amylase and lipase were elevated and mildly elevated SGOT and SGPT.  White blood cell count was normal.  An ultrasound was performed which showed gallstones. General surgery was called.  Dr. Gerrit Friends was on call and he saw the patient, and on question felt that it would be best to admit her, have her regular physician her.  The weekend was coming up, and he asked that I manage her.  On the following day, her abdominal pain was definitely less.  She was seen by cardiologist and medicine and they placed her on telemetry, and thought that she was doing satisfactorily from their standpoint.  I was able to get her on the OR schedule for the following day, and felt that it would be best to do a laparoscopic cholecystectomy and cholangiogram if possible, and she has a ventral hernia in a mid portion of a midline incision where she had some type of vascular procedure years earlier.  HOSPITAL COURSE:  She was taken to surgery on Saturday.  Her  amylase had returned to normal, and she underwent a laparoscopic cholecystectomy.  There were some adhesions, but we could work around these, and she did have a subacute inflamed gallbladder, obviously had clinically a passage of a common duct stone.  Fortunately, the cholangiogram showed no evidence of any stones in the common bile duct.  We could see the little portion of the pancreatic duct.  At the completion of this, I made a midline incision through the area of weakness and just reclosed the fascia.  No mesh were placed, and then the other incisions were closed.  I looked back into the peritoneal cavity to make sure there was no actual injury to the omentum which was predominately caught up in that, not really transverse colon.  Postoperatively, the patient had a little low-grade temperature, and did not want to cough because of the more pain than usual than typical laparoscopic cholecystectomy, and she has been followed on telemetry.  She has done nicely.  Her white blood cell count has returned persistently continually normal, and her glucose has not been a problem.  She is now on the third postoperative morning, doing nicely, and can be discharged in a satisfactory postoperative condition.  I have removed the skin staples, steri-striped the incision, and will see her back in followup in one week.  ACTIVITY:  She is to do no heavy lifting for approximately three weeks.  DISCHARGE MEDICATIONS: 1. Continue on all of her chronic medications. 2. Vicodin for pain. Dictated by:  Anselm Pancoast. Zachery Dakins, M.D. Attending Physician:  Bonnetta Barry DD:  04/01/02 TD:  04/03/02 Job: [id number]

## 2011-02-16 NOTE — H&P (Signed)
NAME:  MARQUITA, LIAS NO.:  [id number]   MEDICAL RECORD NO.:  [id number]                   PATIENT TYPE:  EMS   LOCATION:  MINO                                 FACILITY:  [facility]   PHYSICIAN:  Lyn Records, M.D.                DATE OF BIRTH:  09/20/1935   DATE OF ADMISSION:  12/01/2003  DATE OF DISCHARGE:                                HISTORY & PHYSICAL   HISTORY OF PRESENT ILLNESS:  [name] is a 76 year old black woman who is  admitted to [facility] for further evaluation of chest pain.   The patient, who has a past history of cardiac disease described below,  presented to the emergency department with a 1-week history of chest pain.  She has experienced 5 episodes of chest pain during this time, the last one  being today.  Episodes have all occurred with activity, particularly with  walking.  The chest discomfort is described as a substernal pressure.  It  does not radiate.  It is associated with dyspnea but no diaphoresis or  nausea.  The discomfort resolves in approximately 5 to 10 minutes with rest.  She has not experienced any episodes which have begun at rest.  The taking  of 1 nitroglycerin tablet may accelerate resolution of the chest discomfort.  There are no other exacerbating or ameliorating factors.  It appears not to  be related to position, meals, or respirations.  She believes that this  chest pain is the same as that which heralded her myocardial infarction.  The patient is free of chest pain at this time.   The patient reports that she has previously suffered a myocardial infarction  and subsequently underwent angioplasty.  The prior records are not yet  available.  There is also a history of pericarditis.   The patient continues to smoke 1 pack of cigarettes per day.  There is no  history of hyperlipidemia or hypertension.  She is being treated with oral  medications for diabetes mellitus.   ALLERGIES:  The patient  is not allergic to any medications.   CURRENT MEDICATIONS:  Her current medications include glipizide, metoprolol,  Avandia, Synthroid, and aspirin.   PAST MEDICAL HISTORY:  1. There is a history of hypothyroidism.  2. The patient also has a permanent pacemaker.   FAMILY HISTORY:  Family history is noncontributory.   PREVIOUS OPERATIONS:  Previous operations include:  1. A cholecystectomy.  2. A hernia repair.   SIGNIFICANT INJURIES:  None.   SOCIAL HISTORY:  The patient is employed as a Solicitor.  She does not drink.  She lives with her daughter.   REVIEW OF SYSTEMS:  Review of systems demonstrated no new problems related  to her head, eyes, ears, nose, mouth, throat, lungs, gastrointestinal  system, genitourinary system, or extremities.  There is no history of  neurologic or psychiatric disorder.  There  is no history of fever, chills,  or weight loss.   PHYSICAL EXAMINATION:  VITAL SIGNS:  Blood pressure 130/60.  Pulse 77 and  regular.  Respirations 18.  Temperature 97.0.  GENERAL:  This patient was a middle-aged black woman in no discomfort.  She  was alert, oriented, appropriate, and responsive.  HEENT:  Head, eyes, nose, and mouth were normal.  NECK:  The neck was without thyromegaly or adenopathy.  Carotid pulses were  palpable bilaterally and without bruits.  CARDIAC:  Examination revealed a normal S1 and S2.  There was no S3, S4,  murmur, rub, or click.  Cardiac rhythm was regular.  No chest wall  tenderness was noted.  LUNGS:  The lungs were clear.  ABDOMEN:  The abdomen was soft and nontender.  There was no mass,  hepatosplenomegaly, bruit, distention, rebound, guarding, or rigidity.  Bowel sounds were normal.  BREASTS, PELVIC AND RECTAL:  Examinations were not performed as they were  not pertinent to the reason for acute care hospitalization.  EXTREMITIES:  The extremities were without edema, deviation, or deformity.  Radial and dorsalis pedal pulses were palpable  bilaterally.  NEUROLOGIC:  Brief screening neurologic survey was unremarkable.   LABORATORY AND ACCESSORY CLINICAL DATA:  The electrocardiogram revealed A-V  sequential pacing; all beats were paced.   The chest radiograph was pending at the time of this dictation.   Laboratory studies were pending at the time of this dictation.   IMPRESSION:  1. Chest pain, rule out unstable angina.  The patient has experienced 5     episodes of exertional chest tightness in the last week, relieved with     nitroglycerin at rest.  The chest pain is similar to her prior cardiac     chest pain.  2. Coronary artery disease, status post percutaneous transluminal coronary     angioplasty.  3. Permanent pacemaker.  4. History of pericarditis.  5. Diabetes mellitus.  6. Hypothyroidism.   PLAN:  1. Telemetry.  2. Serial cardiac enzymes.  3. Aspirin.  4. Heparin IV.  5. Nitroglycerin IV.  6. Continue metoprolol.  7. Further measures per Dr. Lyn Records.   COMMENT:  The patient's granddaughter was present throughout the patient  encounter.      [name]. Waldon Reining, MD                   Lyn Records, M.D.    MSC/MEDQ  D:  12/01/2003  T:  12/01/2003  Job:  [id number]   cc:   Lyn Records III, M.D.  [address]  [address]  [place]  [place] [address]  Fax: [phone]

## 2011-02-16 NOTE — Op Note (Signed)
NAMEBRAYLYN, EYE NO.:  [id number]   MEDICAL RECORD NO.:  [id number]                   PATIENT TYPE:  OIB   LOCATION:  2853                                 FACILITY:  [facility]   PHYSICIAN:  Francisca December, M.D.               DATE OF BIRTH:  October 04, 1934   DATE OF PROCEDURE:  10/09/2002  DATE OF DISCHARGE:                                 OPERATIVE REPORT   PROCEDURE PERFORMED:  1. Explantation old pacing generator.  2. Insertion new dual chamber pacing generator.  3. Temporary pacing via right groin.   INDICATIONS FOR PROCEDURE:  The patient is a 76 year old woman who is 4-1/2  years/T upgrade of a single chamber VVI pacer to a dual chamber device  because of myopotential sensing.  She has done well; however, she is now at  end of life on the battery.  She is brought now to the catheterization  laboratory for replacement of her pacing generator.   DESCRIPTION OF PROCEDURE:  The patient was brought to the cardiac  catheterization laboratory where the right groin was prepped and draped in  the usual sterile fashion.  Local anesthesia was obtained and airway  infiltration of 1% Lidocaine.  A 6 French catheter sheath was inserted  percutaneously into the right femoral vein utilizing an anterior approach  over a guiding J wire.  A 5 French balloon flow directed pacing wire was  then advanced into the right ventricular apex under fluoroscopic  observation.  Adequate pacing parameters were obtained with capture at 2  milliamps and the rate was set at 50 for demand and output of 4 milliamps.  Attention was then directed to the right prepectoral region which had been  prepped and draped in the usual sterile fashion as well.  Local anesthesia  was obtained with the infiltration of 1% Lidocaine with epinephrine  throughout.  A 6 cm incision was made over the previously existing surgical  wound.  This was carried down by sharp dissection to the pacemaker  fibrous  capsule.  This was incised with the knife and the pacemaker subsequently  delivered without difficulty.  The leads were disconnected from the pacing  generator and each was tested for adequate pacing parameters as had been  noted below.  The wound was then irrigated using 1% Kanamycin solution.  The  leads were reattached to the new pacing generator, carefully identifying  each by its serial number and carefully tightening each screw into place.  The pacing generator was then placed in the pocket.  The pocket was  inspected for bleeding and none was found.  The wound was then closed using  2-0 Dexon in a running fashion for the subcutaneous layer.  The skin was  approximated using 5-0 Dexon in a running subcuticular fashion.  Steri-  Strips and a sterile dressing were applied.  The temporary pacemaker wire  was  removed.  The patient was then transported to the recovery area in  stable condition in an A pace V sense mode.  Of note, she did have any  underlying rhythm of marked sinus bradycardia about 44 beats per minute.  The AV delay after a paced beat was found to be about 280 msec and in order  to preserve better life the AV delay after a paced beat was set at 300 msec.   EQUIPMENT DATA:  The old pacing device was a Chartered loss adjuster [id number],  serial [id number].  The atrial lead is an Engineer, agricultural.  Serial  #[id number].  The ventricular is a Medtronic model 430-10.  Serial [id number].   The new pacing generator is a [employer] model [id number], serial  [id number].    PACING DATA:  The atrial lead detected at 1.4 mV P wave.  The pacing  threshold was 0.6 volts at 0.5 msec.  The resistence was 300 ohms resulting  in a current of 3.6 milliamps.  The ventricular lead detected at a 10 mV  paced R wave.  The pacing threshold was 0.6 volts at 0.5 msec pulse width.  The resistence was 388 ohms and the resultant current was 1.8 milliamps.                                                Francisca December, M.D.    JHE/MEDQ  D:  10/09/2002  T:  10/09/2002  Job:  [id number]   cc:   Lesleigh Noe, M.D.  [address]  [address]  [place]  [place] [address]  Fax: [phone]   Cardiac Catheterization Laboratory

## 2011-02-16 NOTE — Discharge Summary (Signed)
[name], CADE NO.:  [id number]   MEDICAL RECORD NO.:  [id number]                   PATIENT TYPE:  INP   LOCATION:  6527                                 FACILITY:  [facility]   PHYSICIAN:  [name], M.D.                DATE OF BIRTH:  [birth year]   DATE OF ADMISSION:  09/14/2002  DATE OF DISCHARGE:  09/16/2002                                 DISCHARGE SUMMARY   PRIMARY CARE [name]:  Dr. Candyce Churn. [name].   IMPRESSION:  1. Chest pain; uncertain etiology, not apparently cardiac.     a. Ruled out myocardial infarction, negative serial cardiac enzymes.     b. ([name] 16, 2003) Cardiac catheterization revealing inferior        hypokinesis with ejection fraction 50%.  Left main:  Normal.  Left        anterior descending:  Sixty percent mid after diagonal #1.  Diffuse        luminal irregularities.  Circumflex:  Thirty-to-50% proximal.  Luminal        irregularities, obtuse marginal #1 and obtuse marginal #2.  Right        coronary artery:  Diffuse luminal irregularities up to 50%.  Sixty        percent posterior descending artery.  Intravascular ultrasound of        circumflex was okay.  2. Dyslipidemia:  Patient was enrolled in the CEPT Trial.  She will be     provided with her Lipitor free of charge.  She also is on Zetia 10 mg     p.o. every day.  3. History of hypothyroidism, on supplements.  4. History of tachybrady syndrome; she is pacemaker dependent and her     pacemaker is approaching battery end-of-life.  We will have [name] from     [name] ([phone]) check her pacemaker before she leaves.   PLAN:  1. The patient was discharged home in stable condition.  2. Activity:  No heavy lifting or pushing the day of discharge, then     activities as before.  3. Diet:  Low-fat, low-cholesterol, no-added-salt, diabetic diet.  4. Wound care:  May shower.  5. Special instructions:  Call our office if she develops a large amount of  swelling or bruising in the groin area.  6. Have followup office visit with [name], Wednesday, February     11th, at 10 a.m., phone number [phone].  7. Will follow up with [name], anticipate battery     change-out.   DISCHARGE MEDICATIONS:  (No change from prior to admission.)  A.  Avandia 4 mg p.o. every day.  B.  Lanoxin 0.25 mg p.o. every day.  C.  Glucotrol 10 mg p.o. every day.  D.  Zetia 10 mg p.o. every day.  E.  Synthroid 0.122 mg p.o. every day.  F.  Lopressor 100 mg one-half tab p.o. b.i.d.  G.  Nexium 40 mg p.o. every day.  H.  Enteric-coated aspirin 325 mg p.o. every day.  A. Nitroglycerin tablet 0.4 mg sublingual p.r.n. chest pain.   HISTORY OF PRESENT ILLNESS:  The patient is a pleasant 76 year old obese,  diabetic female with continued lifelong tobacco abuse, who presented with  symptoms of right chest/neck/shoulder discomfort early morning of admission  which was reminiscent of discomfort felt at time of heart attack back in  1998.  Later, day of admission, she developed anterior chest heaviness.  She  took a baby aspirin and summoned EMS.  En route, she received two sublingual  nitrates and four baby aspirin with resolution of chest discomfort.  She  continued with right neck and shoulder pain.   The patient ruled out for myocardial infarction by negative CK-MB and  troponin I.  Her EKG was unhelpful as she was in an A-V sequential pacemaker  rhythm.  Her chest x-ray revealed some mild CHF.  On [name] 16, 2003, she  was taken for coronary angiography by Dr. Lyn [name], with details as  above.  In all, she had diffuse three-vessel moderate disease with no high-  grade disease.  LV dysfunction, EF approximately 50%.  Her circumflex artery  was IVUS'ed as part of her participation in the CEPT Study.   As mentioned, enrolled in the CEPT Trial, so she will receive her Lipitor  free of charge.   Her pacemaker will be checked prior  to her discharge, with followup with  [name] and probable eventual replacement generator by  [name].   PREVIOUS MEDICAL HISTORY:  1. Coronary arteriosclerotic heart disease.     a. History of inferior myocardial infarction (preceding pericarditis,        questionably related to procainamide).     b. July 1998, PTCA of PDA.  EF was 55% without regional wall motion        abnormality.     c. Adenosine Cardiolite, March 26, 2002, revealing no evidence of        infarction or ischemia, with normal wall motion, EF 61%.  2. History of tachybrady syndrome with subsequent permanent transvenous     pacemaker placed in 1992 and subsequent generator change-out by Dr. Corliss [name] in June 1999.  She is pacemaker dependent.  The latest pacemaker     was a [name].  3. Diabetes mellitus, type 2, onset June 1999.  4. History of Graves disease, treated with radioactive iodine in 1992 with     subsequent hypothyroidism, on supplements.  5. Hypertension.  6. Dyslipidemia.  7. History of CHF.  8. History of syncope, June 1999, secondary to pacemaker malformation.   PREVIOUS SURGICAL HISTORY:  1. Laparoscopic cholecystectomy, June 2003, Dr. Anselm [name]. [place]; also,     repair of ventral hernia at that time.  2. History of some type of abdominal vascular procedure years earlier,     uncertain what it was.  3. Pacemaker placement in 1992 with generator change-out in 1998.   LABORATORY TESTS AND DATA:  WBC 5.8, hemoglobin 12.5, hematocrit of 36.8,  platelets of 251,000; differential all within normal range.  Pro time of  13.3, INR of 1.0, PTT of 36.  Sodium 142, potassium 3.6; supplemented; 3.5  on [name] 16th.  Chloride of 110, CO2 26, glucose 151 to 173, BUN of 11,  creatinine 0.7.  LFTs all within normal  range, though slightly decreased  protein at 5.7 and albumin at 2.9.  Calcium of 8.9.  First CK 168 with MB fraction 3.9 and troponin I of 0.03.  Second  CK of 155, MB fraction of 3.3,  troponin I of 0.02.  Third CK of 133, MB fraction of 2.7, troponin I of  0.01.   Admission chest x-ray revealed pulmonary venous hypertension and mild  pulmonary edema.  Mild cardiac enlargement.  Dual-chamber pacemaker  overlying the right hemithorax in stable position.   COMMENT:  Total time preparing this discharge was greater than 40 minutes,  including dictating this discharge summary, filling out discharge  instructions and setting up followup office visit.   ADDENDUM:  Pacemaker generator evaluated by [name] representative;  generator end-of-life anticipated in approximately one and a half months.  Will follow closely.     Salomon Fick, N.P.                       [name], M.D.    MES/MEDQ  D:  09/16/2002  T:  09/17/2002  Job:  [id number]   cc:   Robin Churn. Allyne Gee, M.D.  [phone] [place]  [place] [address]  Fax: [phone]

## 2011-02-16 NOTE — Discharge Summary (Signed)
NAMEMOZEL, BURDETT NO.:  [id number]   MEDICAL RECORD NO.:  [id number]          PATIENT TYPE:  INP   LOCATION:  2007                         FACILITY:  [facility]   PHYSICIAN:  Lyn Records, M.D.   DATE OF BIRTH:  Apr 03, 1935   DATE OF ADMISSION:  10/31/2007  DATE OF DISCHARGE:  11/04/2007                               DISCHARGE SUMMARY   DISCHARGE DIAGNOSIS:  1. Unstable angina resolved.  2. Nonobstructive coronary artery disease.  3. Atrial fibrillation.  4. Gastroesophageal reflux disease.  5. Diabetes mellitus.  6. Hypothyroidism, on replacement therapy.  7. Known coronary artery disease.  8. Permanent pacemaker.   Ms. [name] is a 76 year old female who was admitted on October 31, 2007 for chest pain/heaviness.  Laboratory studies during her stay  showed a hemoglobin of 11.1, hematocrit 32.8, platelets 164, total CK of  284, MB fraction of 5.4, troponins were all normal.  Hemoglobin A1c was  7.1, total cholesterol 193, triglycerides 207, HDL 38, LDL 114.  Dig  level less than 0.2.  Chest x-ray showed cardiomegaly, stable chronic  bronchitic changes, and bilateral basilar scarring.  No acute  infiltrate.  Pacemaker in place.  EKG showed T-wave inversion in the  anterior leads, otherwise no acute ST abnormalities.   Ultimately, the patient underwent cardiac catheterization and was found  to have the following:  Left main normal, LAD with luminal  irregularities, circumflex with a patent stent from her previous  implantation, OM was small with a 70% stenosis.  Right coronary artery  had a 50-70% proximal and mid stenosis, and PDA stenosis.  The patient  has moderate coronary artery disease with a patent circumflex stent and  normal LV.  At this point, Dr. Katrinka Blazing felt the patient should be treated  medically including the addition of Plavix and a statin.   DISCHARGE INSTRUCTIONS:  Increase activity slowly.  Follow up with Dr.  Katrinka Blazing on November 18, 2007  and 2:45 p.m.   The patient is to stop WelChol and Zetia.   DISCHARGE MEDICATIONS:  1. Zocor 40 mg nightly.  2. Plavix 75 mg a day.  3. Glipizide 10 mg a day.  4. Nexium 40 mg daily.  5. Lanoxin 125 mcg a day  6. Metoprolol 50 mg twice a day.  7. Colace 50 mg a day.  8. Avandia 4 mg a day.  9. Multivitamin daily.  10.Synthroid 125 mcg a day.  11.Aspirin 325 mg a day.   The patient is to remain on low-sodium heart-healthy diet.  Increase  activity slowly.  Call for any further questions or concerns.      Guy Franco, P.A.      Lyn Records, M.D.  Electronically Signed    LB/MEDQ  D:  01/20/2008  T:  01/21/2008  Job:  [id number]   cc:   Candyce Churn. Allyne Gee, M.D.

## 2011-02-16 NOTE — Discharge Summary (Signed)
[name], [name] NO.:  [id number]   MEDICAL RECORD NO.:  [id number]                   PATIENT TYPE:  OBV   LOCATION:  6531                                 FACILITY:  [facility]   PHYSICIAN:  Lyn Records, M.D.                DATE OF BIRTH:  March 02, 1935   DATE OF ADMISSION:  11/30/2003  DATE OF DISCHARGE:                                 DISCHARGE SUMMARY   ADMISSION DIAGNOSES:  1. Chest pain, rule out myocardial infarction.  2. Coronary artery disease status post myocardial infarction with     percutaneous transluminal coronary angioplasty to the distal right     coronary artery 1997.  3. Noninsulin-dependent diabetes.  4. Hypothyroid.   DISCHARGE DIAGNOSES:  1. Chest pain, negative for myocardial infarction.  2. Coronary artery disease.     A. Status post percutaneous coronary intervention and Cypher stent        placement to the proximal circumflex December 01, 2003.     B. Status post non-Q-wave myocardial infarction with percutaneous        transluminal coronary angioplasty to the distal right coronary artery        in 1997.  3. Hypothyroidism, stable on Synthroid.  4. Noninsulin-dependent diabetes, stable on oral agents.   PROCEDURE:  Left heart catheterization December 01, 2003.   COMPLICATIONS:  None.   DISCHARGE STATUS:  Stable, improved.   HISTORY OF PRESENT ILLNESS:  Please see complete H&P for details, but in  short, this is a 76 year old with known CAD as described above. She  presented to the emergency room after approximately a one week history of  chest discomfort. All episodes have been exertional in nature. She described  it as a pressure sensation. No radiation but did have dyspnea. Denied nausea  or diaphoresis. All episodes would resolve within 5 to 10 minutes with rest.   PHYSICAL EXAMINATION:  Please see complete H&P, but in short, vital signs  were within normal limits. She was afebrile. Physical exam was essentially  normal  without any abnormalities.   EKG revealed an AV paced rhythm. Normal heart rate.   Admission chest x-ray showed some mild atelectasis in the left lung base and  stable cardiomegaly.   Admission labs showed a normal CBC and CMP with the exception of a very mild  hyperglycemia at 112. PT and PTT were all normal. Cardiac markers in the  emergency room were negative x2 sets. First set of cardiac enzymes revealed  elevated CK-MB but normal troponin at 0.01. She had 293 total CK with 9.2  MB. Magnesium was also normal at 1.9.   HOSPITAL COURSE:  The patient was admitted to rule out MI with serial  cardiac enzymes. She was started on aspirin, IV heparin, and IV  nitroglycerin. Her outpatient metoprolol was continued.   Later the day of admission, the patient was completely pain free.  Only  complaint was a headache secondary to IV nitroglycerin. Vital signs were  stable. Dr. Katrinka Blazing had discussion with the patient as far as to further  evaluation of her symptoms. They agreed to proceed on with cardiac  catheterization.   She was taken to the cardiac catheterization lab late on the afternoon of  December 01, 2003. Results showed normal left main. LAD had luminal  irregularities in the mid section after the first diagonal up to  approximately 60%. The first diagonal itself had an area of 50%. The  circumflex had a proximal 99% lesion. The RCA had diffuse disease with  proximal and mid lesions of approximately 60%. She had low normal LV  function with an EF of 50%.   At this point, he proceeded with a PCI to the circumflex. Cypher stent was  placed without any difficulties. The patient tolerated the procedure well.  She was kept on IV Integrilin and IV nitroglycerin for an additional 12  hours.   Smoking cessation consult was done as well on December 01, 2003. She did express  a desire to quite smoking. Will see.   Her groin sheath was pulled at 7:10 p.m. after cardiac catheterization. Her  ACT  was 152. The patient did complain of some nausea. Her blood pressure  dropped to 71/35. IV nitroglycerin was discontinued at this point. She was  given IV fluids and one dose of IV atropine with good response. There was no  hematoma or ecchymosis noted at the groin site. Dressing was applied. No  problems the following morning noted at her right groin catheterization  site. Potassium was normal at 3.5. Creatinine 0.7. Vital signs remained  stable. She continued to remain pain free. She was ready for discharge.   DISCHARGE MEDICATIONS:  1. Synthroid 12 mcg daily.  2. Glipizide 10 mg daily.  3. Avandia dosing as previously taken at home.  4. Aspirin 81 mg daily.  5. Plavix 75 mg daily for the next six months.  6. Metoprolol 25 mg daily.   DISCHARGE INSTRUCTIONS:  She has been instructed not to undertake any  strenuous activity for the next two days. She is not lift anything heavier  than 5 pounds, do a lot of bending, stooping, or straining. She is not to  drive today. She is not to return to work until Sunday, December 05, 2003.   She is instructed to maintain a low fat diet as well as maintain her  diabetic diet restrictions.   She may shower and gently wash her right groin catheterization site with  warm soap and water. I have instructed her not to soak in a bathtub for the  next several days.   If she experiences any pain, swelling, or bruising or has any other problems  or concerns, she is to contact Dr. Michaelle Copas office.   She has an appointment to see Dr. Katrinka Blazing for followup on Friday December 17, 2003 at 12 p.m.      Adrian Saran, N.P.                        Lyn Records, M.D.    HB/MEDQ  D:  12/02/2003  T:  12/03/2003  Job:  [id number]

## 2011-02-16 NOTE — Op Note (Signed)
[name], [name] NO.:  [id number]   MEDICAL RECORD NO.:  [id number]                   PATIENT TYPE:  OBV   LOCATION:  0479                                 FACILITY:  [facility]   PHYSICIAN:  Anselm Pancoast. Zachery Dakins, M.D.          DATE OF BIRTH:  01-27-1935   DATE OF PROCEDURE:  12/24/2002  DATE OF DISCHARGE:                                 OPERATIVE REPORT   PREOPERATIVE DIAGNOSES:  Incisional hernia.   POSTOPERATIVE DIAGNOSES:  Incisional hernia.   OPERATION:  Repair of incisional hernia with mesh.   ANESTHESIA:  General.   SURGEON:  Anselm Pancoast. Zachery Dakins, M.D.   ASSISTANT:  Nurse.   HISTORY:  [name] is a 76 year old Caucasian female who I first met  approximately six months ago when she was having symptomatic gallstones.  Years earlier, probably about 41, she had had some type of vascular  procedure through a big general midline incision and had a hernia around the  umbilicus.  At the time, she presented with pancreatitis and we removed the  gallbladder with a laparoscope after her pancreatitis had resolved and I  tried to put some stitches in the hernia that was epigastric kind of going  to the left of the umbilicus but I did not want to put any mesh in and we  only basically placed stitches through a small incision. She did reasonably  well from the pancreatitis but recently she started noticing the bulge was  definitely back and I saw her back in the office. On exam, she has got a  fascial defect probably about 2 inches. When she strains, the actual bulge  is bigger but she is having minimal GI type symptoms and I think it would be  best just to go ahead and repair this and use a piece of Prolene mesh within  the preperitoneal cavity space. The patient preoperatively was given a gram  of Kefzol and taken to the operative suite. The area I had marked the skin  where she had had the fascia defect. Dr. Okey Dupre wanted to place a Foley since  she does have a pacemaker and is a diabetic and this was done. Next, the  abdomen was prepped with Betadine solution and draped in a sterile manner. I  made an incision maybe 1/4 of the original big midline incision that curves  way to the left around the umbilicus and dissected down identifying the  hernia sac which was dissected free in all areas. Then using Kochers on the  actual fascia developed basically a preperitoneal space well on the left and  this did nicely. I took it down below the actual umbilical port defect that  we had done the laparoscopic gallbladder on. Superiorly there was a little  fascial defect up about an inch and went past that and then on the right  side there was really not truly a preperitoneal space  but I took the  peritoneum and kind of a falciform and it is under no tension and I sutured  it to the abdominal wall over to the right so that there will be definitely  omentum and falciform under the Prolene mesh. Then a piece of 3 x 6 Prolene  mesh was used suturing under lapping the fascia about an inch and a half in  each direction with interrupted #0 Prolene sutures through the muscle layer.  The mesh I had worked completely around first working from the left and then  switching back to the right and then I closed the actual fascia with  interrupted sutures of #0 Prolene encompassing a few fibers of the Prolene  mesh in the midline. The mesh is lined without excessive tension and the  fascia closed easily. No local anesthesia was placed and then the  subcutaneous wounds were closed with 3-0 Vicryl and then the skin closed  with staples. We will keep her n.p.o. today in case she would be a little  nauseated but hopefully can resume a diet tomorrow and then probably let her  go home either on Saturday or Sunday according to how she is doing on her  usual cardiac and diabetic medications.                                               Anselm Pancoast. Zachery Dakins,  M.D.    WJW/MEDQ  D:  12/24/2002  T:  12/24/2002  Job:  [id number]

## 2011-02-16 NOTE — H&P (Signed)
[facility]  Patient:    [name], [name] Visit Number: [id number] MRN: [id number]          Service Type: MED Location: 3W [phone] Attending Physician:  Bonnetta Barry Dictated by:   Velora Heckler, M.D. Admit Date:  03/26/2002                           History and Physical  REASON FOR ADMISSION:  Biliary pancreatitis.  REFERRING PHYSICIAN:  Dr. Mechele Collin L. Wentz.  PRIMARY PHYSICIAN:  Dr. Velna Hatchet.  CARDIOLOGIST:  Dr. Garnette Scheuermann.  HISTORY OF PRESENT ILLNESS:  The patient is a 76 year old black female who presents to the emergency department with a 24-hour history of abdominal pain primarily in the epigastrium.  She denies nausea or vomiting.  She denies fevers or chills.  She denies diarrhea or constipation.  The patient presented to the emergency department where laboratory studies showed an elevated amylase and lipase and slightly elevated hepatic transaminases.  White blood cell count was normal.  Abdominal ultrasound shows numerous gallstones. General surgery was called for evaluation and management.  The patient denies any prior history of hepatobiliary or pancreatic disease. Her only prior abdominal surgery was for a vascular procedure of unknown type due to poor circulation in the lower extremities.  This was performed in [place].  The patient is uncertain of the nature of this operation.  The patient does have a significant cardiac history with coronary artery disease, history of angioplasty, and history of pacemaker placement.  The patient is now admitted on the general surgical service for management of biliary pancreatitis and need for cholecystectomy during this hospitalization.  PAST MEDICAL HISTORY: 1. History of coronary artery disease, status post angioplasty. 2. History of AV block with pacemaker placement in 1992, and a second    pacemaker placed in 1998. 3. History of non-insulin-dependent diabetes mellitus. 4.  History of gastroesophageal reflux disease. 5. History of Graves disease, treated with radioactive iodine in 1992, with    subsequent hypothyroidism. 6. Status post vascular procedure of unknown type on the abdomen for    peripheral vascular disease.  MEDICATIONS:  Nexium, Lanoxin, Glucophage, Lopressor, Synthroid.  ALLERGIES:  None known.  SOCIAL HISTORY:  The patient lives in [place].  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  She smokes a pack of cigarettes a day.  She drinks alcohol in moderation.  She denies illicit drug use.  REVIEW OF SYSTEMS:  A 15-system review discussed at length with the patient without significant other positives except as noted above.  FAMILY HISTORY:  Noncontributory.  PHYSICAL EXAMINATION:  GENERAL:  The patient is a 76 year old moderately obese black female on a stretcher in the emergency department in mild discomfort.  VITAL SIGNS:  Temperature not recorded.  Heart rate 60, respiratory rate 18, blood pressure 140/52.  HEENT:  Normocephalic.  Sclerae are clear.  Dentition is fair.  Voice is normal.  NECK:  Supple, without mass.  Thyroid is normal, without nodularity.  LUNGS:  Clear to auscultation bilaterally.  CARDIAC:  Regular rate and rhythm.  ABDOMEN:  Mildly to moderately distended.  There is a well-healed midline incision.  There are a few scattered bowel sounds on auscultation.  There is no sign of herniation.  There is mild tenderness to deep palpation in the epigastrium.  There is no guarding.  There is no rebound.  There are no palpable masses.  There is no sign  of hepatosplenomegaly.  EXTREMITIES:  Nontender.  Without edema.  Feet are warm.  NEUROLOGIC:  The patient is alert and oriented to person, place, and time. Without focal neurologic deficit.  LABORATORY DATA:  Laboratories dated March 26, 2002, from the emergency department:  CBC shows white count 5.2, hemoglobin 12.1, hematocrit 35.2%, platelet count  206,000.  Chemistry profile shows the following abnormalities: Glucose 127, SGOT 71, SGPT 44.  Remainder of liver function tests, electrolytes, and renal function tests are all within normal limits.  Lipase is elevated at 248, and amylase is elevated at 299.  Radiographic studies:  Chest x-ray shows cardiomegaly with pacemaker.  No heart failure.  Chronic lung changes, with no active disease.  Ultrasound of the abdomen shows cholelithiasis.  There is no wall thickening.  No pericholecystic fluid.  There is probable fatty infiltration of the liver.  IMPRESSION: 1. Biliary pancreatitis. 2. Cholelithiasis. 3. Coronary artery disease. 4. Atrioventricular block. 5. Non-insulin-dependent diabetes. 6. Hypertension. 7. Gastroesophageal reflux disease. 8. Peripheral vascular occlusive disease.  PLAN:  I discussed the case by telephone with Dr. Velna Hatchet.  We will admit the patient on to the general surgical service.  Dr. Allyne Gee will see her in consultation and address her medical issues.  We will proceed with CT scan of the abdomen and pelvis tonight, both to assess her for severity of pancreatitis and to evaluate the abdomen for an unknown vascular procedure. We will repeat laboratory studies on Friday, March 27, 2002.  If the patients biliary pancreatitis is resolving, she will likely go to the operating room for cholecystectomy on Saturday, March 28, 2002.  Options for laparoscopic surgery versus open surgery were discussed with the patient.  Given her prior abdominal surgery, she may require open laparotomy.  The patient understands and wishes to proceed with this plan. Dictated by:   Velora Heckler, M.D. Attending Physician:  Bonnetta Barry DD:  03/26/02 TD:  03/28/02 Job: [id number]

## 2011-02-16 NOTE — Cardiovascular Report (Signed)
[name], [name] NO.:  [id number]   MEDICAL RECORD NO.:  [id number]                   PATIENT TYPE:  OBV   LOCATION:  2025                                 FACILITY:  [facility]   PHYSICIAN:  Lesleigh Noe, M.D.            DATE OF BIRTH:  Feb 07, 1935   DATE OF PROCEDURE:  DATE OF DISCHARGE:                              CARDIAC CATHETERIZATION   INDICATIONS FOR PROCEDURE:  Unstable angina.   PROCEDURES PERFORMED:  1. Left heart catheterization.  2. Selective coronary angiogram.  3. Left ventriculography.  4. Stent of the proximal circumflex with drug-eluting CYPHER.   DESCRIPTION OF PROCEDURE:  After informed consent, a 6-French sheath was  placed in the right femoral artery using the modified Seldinger technique.  A 6-French AT multipurpose catheter was used for hemodynamic recordings,  left ventriculography by hand injection, and selective left and right  coronary angiography.  The patient tolerated the diagnostic procedure  without complications.  She had a high-grade lesion in the circumflex.  This  was felt to be the culprit lesion, and we discussed the procedure and  treatment options with the patient and decided to perform angioplasty and  stent of the circumflex.  I spoke with her daughter, Luetta Nutting.   We used a double bolus followed by an infusion of Integrilin.  The patient's  heparin infusion had just been discontinued when she came to the  catheterization laboratory.  The initial ACT was 204 seconds.  We gave an  additional 1000 units of heparin that increased the ACT to 240+ seconds.  We  then performed angioplasty and stenting using a 6-French JL4 guide catheter,  an Asahi Prowater guidewire, a 3.5 x 15 Maverick balloon, and a 3.5 x 18  CYPHER stent.  The CYPHER stent was deployed to 15 atm.  We then postdilated  with a 4 x 12-mm Quantum Maverick in the proximal two-thirds of the stent to  a peak pressure of 14 atm x30 seconds.  The  patient had significant chest  pain with each balloon inflation.  Post stent there was a nice angiographic  result with 0% stenosis and TIMI grade-3 flow noted.  Postprocedure ACT was  208 seconds.   RESULTS:   I. HEMODYNAMIC DATA:  A.  Aortic pressure 154/65.  B. Left ventricular pressure 154/70.   II. LEFT VENTRICULOGRAPHY:  Left ventricular function is normal.  EF 65%.  No regional wall motion abnormality.   III. CORONARY ANGIOGRAPHY:  A.  Left main coronary:  Widely patent.  B.  Left anterior descending coronary:  Widely patent.  There is 50-60%  stenosis after the first diagonal.  There are multiple luminal  irregularities throughout the LAD.  The first diagonal arises just proximal  to the 60% lesion and contains 50-60% narrowing in the proximal segment.  C.  Circumflex artery:  This is a large artery that contains a very  eccentric  99% stenosis.  Circumflex then bifurcates into 2 significant  branches, the more superior branch being the most important of the two.  D.  Right coronary:  The right coronary is dominant.  It is origin to the  PDA and left ventricular branch.  There is 50-60% narrowing in the proximal  vessel, 50-60% distal narrowing in the PDA which was the site of prior  angioplasty.  It is patent, with 50% proximal narrowing.  .   IV. PERCUTANEOUS CORONARY INTERVENTION:  The calcified proximal circumflex  high-grade stenosis was reduced from 99% to 0% after stenting with the  CYPHER stent and postdilating with a 4-mm balloon.   CONCLUSIONS:  1. Unstable angina due to high-grade obstruction in the proximal circumflex.  2. Successful stenting of the circumflex with CYPHER drug-eluding stent.  3. Moderate mid left anterior descending and diagonal disease.  There is     mild to moderate right coronary disease as well noted in the proximal mid     distal right.  4. Normal left ventricular function.   PLAN:  Aspirin, Plavix at least 6 months.  Integrilin 12  hours.  Hopeful  discharge December 02, 2003.                                               Lesleigh Noe, M.D.    HWS/MEDQ  D:  12/01/2003  T:  12/02/2003  Job:  [id number]   cc:   [facility]   Internal Medicine [facility], [facility]

## 2011-02-16 NOTE — H&P (Signed)
NAMENICOL, HERBIG NO.:  [id number]   MEDICAL RECORD NO.:  [id number]                   PATIENT TYPE:  OBV   LOCATION:  0479                                 FACILITY:  [facility]   PHYSICIAN:  Anselm Pancoast. Zachery Dakins, M.D.          DATE OF BIRTH:  [birth year]   DATE OF ADMISSION:  12/24/2002  DATE OF DISCHARGE:                                HISTORY & PHYSICAL   CHIEF COMPLAINT:  Incisional hernia.   HISTORY:  [name] is a 76 year old female, who I had met last year when  she had pancreatitis secondary to gallstones, and we did a laparoscopic  cholecystectomy and cholangiogram after the pancreatitis resolved.  Years  earlier, I think it was about '86, she had an abdominal vascular procedure  through her midline incision and had a hernia above the umbilicus that we  attempted to put a few stitches in at the time of laparoscopic surgery, but  this broke down approximately 4-5 months later.  She is a mild diabetic on  Lanoxin, has a chronic pacemaker, but is stable medically and is now  readmitted for repair of this incisional hernia with mesh.  When the patient  strains, there is probably an orange-sized defect, kind of above and  slightly to the left of midline above the umbilicus that prolapses out but  when she lies flat, it reduces spontaneously.  We have not done any  evaluation of her gastrointestinal tract at this time, but she has not had  guaiac-positive stools, and her bowels are working satisfactorily.   CHRONIC MEDICATIONS:  1. Glucotrol 1 daily.  2. Lanoxin 0.25 mg 1 daily.  3. Synthroid 1 daily.  4. Nexium 40 mg daily.  5. Avandia, do not know the dosage.  6. Zetia 1 daily.  7. Baby aspirin that has been discontinued.  8. Metoprolol 1 daily.   CARDIOLOGIST:  Lyn Records, M.D.   REGULAR PHYSICIAN:  Robyn N. Allyne Gee, M.D.   PAST MEDICAL HISTORY:  Please see on chart.   PHYSICAL EXAMINATION:  GENERAL:  She is an  elderly-appearing black female.  No acute distress.  VITAL SIGNS:  Blood pressure 134/78, respirations 18, pulse 64 regular,  weight 157, 5 feet 2 inches in height.  EYES/EARS/NOSE/THROAT:  Appears adequately hydrated.  No oral lesions.  No  cervical, supraclavicular lymphadenopathy.  CHEST:  Good breath sounds bilaterally.  She does have a pacemaker; of  course she has regular rhythm from the pacing.  CARDIAC:  Not really a significantly enlarged heart.  ABDOMEN:  She has a midline incision that goes from the xiphoid to well  below the umbilicus but to the left and right above the umbilicus is an  orange-sized defect that protrudes with straining.  The superior top and  inferior aspect of this incision does not appear to be broken down.  No  tenderness in the upper abdomen.  RECTAL:  Unremarkable.  EXTREMITIES:  There is no pedal edema.  No skin breakdown or significant  varicosities.  CNS:  Physiologic.   ADMISSION IMPRESSION:  1. Incisional hernia from a vascular procedure years earlier.  2. Adult onset diabetes, controlled.  3. Cardiac disease with a functioning pacemaker.  No history of angina.   PLAN:  The patient will have repair of this incisional hernia with mesh as  an open procedure and will probably be hospitalized about two days  postoperatively.                                               Anselm Pancoast. Zachery Dakins, M.D.    WJW/MEDQ  D:  12/25/2002  T:  12/25/2002  Job:  [id number]

## 2011-02-16 NOTE — Op Note (Signed)
[facility]  Patient:    JONIA, OAKEY Visit Number: [id number] MRN: [id number]          Service Type: MED Location: 3W [phone] Attending Physician:  Bonnetta Barry Dictated by:   Anselm Pancoast. Zachery Dakins, M.D. Proc. Date: 03/28/02 Admit Date:  03/26/2002                             Operative Report  PREOPERATIVE DIAGNOSES: 1. Chronic cholecystitis, recent gallstone pancreatitis secondary to probable    passage of common duct stone. 2. Incisional hernia.  OPERATION:  Laparoscopic cholecystectomy with cholangiogram and repair of a midline incisional hernia.  ANESTHESIA:  General.  SURGEON:  Anselm Pancoast. Zachery Dakins, M.D.  ASSISTANT:  Sandria Bales. Ezzard Standing, M.D.  HISTORY:  [name] is a 76 year old female, who was admitted through the ER on March 26, 2002, by Dr. Gerrit Friends for abdominal pain.  The patient has had a previous abdominal aneurysm repair and on examination in the ER, she was definitely tender.  CT was obtained along with labs, and this showed mildly elevated liver tests; amylase initially was about 300, and her white count was not elevated.  She has a small bulge in the mid portion of her old abdominal surgery incision, and the patient clinically was improved the following day with an amylase back to normal and her liver tests mildly improved also. Dr. Gerrit Friends asked me to see her since I was on call this weekend, and the patient desired to proceed on with an elective cholecystectomy and repair of this incisional hernia.  Hopefully we can do the cholecystectomy with a laparoscope, and I plan on opening the mid portion of the incision and re-closing the fascia without using mesh.  DESCRIPTION OF PROCEDURE:  The patient was taken to the operative suite, induction of general anesthesia, positioned on the OR table, and then the abdomen prepped with Betadine surgical scrub and solution and draped in a sterile manner.  Her midline incision had  originally curved to the left of the umbilicus, and I made a small incision at the midline subumbilical area, identified the fascia.  This was a fairly thin layer.  I made a small incision, picked up this between two Kochers and then sort of finger dissected to the right lateral, and I wanted to get into the free peritoneal cavity. Hasson cannula was then placed after traction sutures were placed and fortunately, she has no adhesions in her right upper quadrant.  The gallbladder was thickened, not acutely inflamed, some old chronic adhesions around it, the upper 10 mm trocar was placed under direct vision, and the two lateral 5 mm trocars were placed also.  We grasped the gallbladder, retracted upward and outward, kind of stripped the peritoneum over the proximal portion of the gallbladder, and there was significant inflammation, and the cystic duct was identified, the cystic artery identified.  I clipped the junction of the cystic duct gallbladder and then used a Cook cholangiocatheter proximal to do the x-ray.  This showed a fairly long cystic duct overlying the common bile duct and questionably we could see possibly some little stones in the distal common bile duct, and I repositioned her and did a second injection on this. It appeared that there were not stones in the distal common bile duct but it was where the cystic duct was kind of overlying the common bile duct, and you could see the visualization over the pancreatic duct.  The radiologist reviewed the fluoroscopy films and thought that there was no evidence of any common duct stones.  The cystic duct catheter was removed.  The cystic duct was triply clipped slight proximal to where we inserted the catheter, the cystic artery doubly clipped proximally and singly distally and divided, and then the gallbladder was freed from its bed using the hook electrocautery and fashioned with a spatula.  I placed the gallbladder within an  EndoCatch bag and then brought it out through the umbilicus and reinserted the umbilical port.  Next, I made an incision, dissected down, identified the fascia and sort of freed up this little preperitoneal space and hernia sac and vaginated that and then closed the fascia with interrupted sutures of 0 Prolene.  This comes together nicely.  The defect was probably about 4 cm in length.  The hernia sac was bigger, but the fascial defect was not as large, and I then reinserted the camera.  There was no evidence of any bleeding.  The irrigating fluid that had been placed was aspirated and removed the two 5 mm lateral ports and then withdrew the umbilical port.  I then sutured the fascia at the umbilicus also with a couple of 0 Prolene sutures in addition to the Vicryl and then removed the upper 10 mm trocar.  The subcutaneous wounds were closed with 3-0 Vicryl, and then I closed all of the skin incisions with staples. The patient tolerated the procedure satisfactorily and was sent to recovery room in a stable postop condition.  Hopefully she will be able to eat later today.  Whether she will be ready for discharge tomorrow or Monday will determine the degree of her pain. Dictated by:   Anselm Pancoast. Zachery Dakins, M.D. Attending Physician:  Bonnetta Barry DD:  03/28/02 TD:  03/30/02 Job: [id number]

## 2011-02-16 NOTE — Discharge Summary (Signed)
NAMESANJA, [name] NO.:  [id number]   MEDICAL RECORD NO.:  [id number]                   PATIENT TYPE:  INP   LOCATION:  0479                                 FACILITY:  [facility]   PHYSICIAN:  Anselm Pancoast. Zachery Dakins, M.D.          DATE OF BIRTH:  24-Oct-1934   DATE OF ADMISSION:  12/24/2002  DATE OF DISCHARGE:  12/27/2002                                 DISCHARGE SUMMARY   DISCHARGE DIAGNOSES:  1. Incisional hernia epigastric area.  2. History of diabetes controlled on Lanoxin.  3. History of pacemaker.   OPERATION:  Repair of incisional hernia with mesh under general anesthesia.   HISTORY:  The patient is a 76 year old black female who I met last year when  she was recovering from a pancreatitis secondary to gallstones and we  proceeded with a laparoscopic cholecystectomy and cholangiogram.  She had an  epigastric hernia from a vascular procedure done in 1986 through a large  midline incision and at the time of the laparoscopic repair we attempted to  see if we could put some fascial stitches in this to control it and it  appeared to be working initially but then after 4 or 5 months the bulge  reoccurred.  She has a chronic pacemaker and is a mild diabetic, also on  Lanoxin, and I recommended that this be repaired with a piece of mesh within  the preperitoneal space and she is admitted at this time for that planned  procedure.   MEDICATIONS:  Her chronic medications are Glucotrol one daily, Lanoxin 0.25  one daily, Synthroid, Nexium, Avandia, baby aspirin, metoprolol.   PHYSICIANS:  Dr. Garnette Scheuermann is her cardiologist.  Dr. Allyne Gee is her regular  medical physician.   HOSPITAL COURSE:  She was taken to surgery under general anesthesia through  a small midline incision I opened up the hernia, placed a piece of 3 x 6  inch Prolene mesh within the preperitoneal space and closed the fascia over  it.  Postoperatively she was having a moderate amount of  pain but we did not  actually place an NG tube, we started her on a liquid diet the following day  which she appeared to be tolerating and using PCA morphine for pain control,  her glucose was not a problem, cardiac was stable and then on the second  postoperative day she was tolerating a liquid diet, had a little low-grade  fever probably secondary to atelectasis but this cleared and she was ready  for discharge in improved condition on 12/27/2002.  I will see her in the  office to remove her sutures.  There were no drains placed  at the time of surgery and she is discharged continuing all of her chronic  medications plus she has Tylox for incisional pain.  We will see her in the  office for removal of the stitches or staples in approximately 5 days  and  she was wearing a narrow abdominal binder which assists in the pain relief  postoperatively.                                               Anselm Pancoast. Zachery Dakins, M.D.    WJW/MEDQ  D:  01/07/2003  T:  01/08/2003  Job:  [id number]   cc:   Candyce Churn. Allyne Gee, M.D.  [address]  [address]  [place]  [place] [address]  Fax: [phone]

## 2011-02-16 NOTE — Cardiovascular Report (Signed)
NAMEJOURI, THREAT NO.:  [id number]   MEDICAL RECORD NO.:  [id number]                   PATIENT TYPE:  INP   LOCATION:  6527                                 FACILITY:  [facility]   PHYSICIAN:  Lesleigh Noe, M.D.            DATE OF BIRTH:  Jul 16, 1935   DATE OF PROCEDURE:  09/15/2002  DATE OF DISCHARGE:                              CARDIAC CATHETERIZATION   INDICATIONS FOR PROCEDURE:  Prolonged chest discomfort, continued cigarette  use. Positive history of hypercholesterolemia and prior right coronary  percutaneous intervention in 1997 with PTCA of the distal right coronary.  This study is being done to rule out progression of coronary disease.   PROCEDURE PERFORMED:  1. Left heart catheterization.  2. Selective coronary angiogram.  3. Left ventriculography.  4. Intravascular ultrasound, circumflex coronary artery, CEPT study [facility]     [facility]).   DESCRIPTION OF PROCEDURE:  After informed consent, a 6 French sheath was  placed in the right femoral artery using a modified Seldinger technique.  A  6 French A2 multipurpose catheter was used for hemodynamic recordings, left  ventriculography, selective left and right coronary angiography.  No high-  grade obstructions were noted.  There were multiple luminal irregularities  with up to 50% narrowing noted in all three coronary vascular beds.  There  was no significant re-stenosis in the distal right coronary.   We used a 6 [place] #4 left Judkins guide catheter to perform intravascular  ultrasound on the circumflex artery.  We used the Atlantis pro intravascular  ultrasound device and a BMW wire.  Then 3500 units of heparin was  administered. The ACT was 300 seconds.  Intravascular ultrasound was  performed without complications.  The case was terminated and the patient  taken to the holding area where the sheaths will be removed once the ACT is  less than 175  seconds.   RESULTS:  I:  HEMODYNAMIC DATA:  a.  Aortic pressure 152/55.  b.  Left ventricular pressure 151/11.   II:  LEFT VENTRICULOGRAPHY:  The left ventricle is somewhat dilated. There  is mild to moderate inferior hypokinesis.  There is some evidence of apical  hypokinesis.  EF, however, is 55%.   III:  CORONARY ANGIOGRAPHY:  Both the right and left coronary systems are  calcified.  a.  Left main:  Widely patent.  b.  Left anterior descending coronary:  Luminal irregularities are noted  throughout the length of the vessel.  The mid vessel beyond the second  diagonal contains an eccentric 50-60% narrowing.  The second diagonal  contains irregularities with up to 40-50% stenosis in the proximal and mid  segment.  c.  Circumflex artery:  The circumflex is large.  It gives origin to two  obtuse marginal branches.  The first obtuse marginal was large and  bifurcates.  The second obtuse marginal is relatively small.  The proximal  vessel contains eccentric stenosis of up to 50% with some lucency noted.  Irregularities are also noted in the continuation of the circumflex before  the origin of the smaller second obtuse marginal.  These narrowings are up  to 50%.  d.  Right coronary:  The right coronary is diffusely diseased with  irregularities in the proximal mid and distal segment.  The PDA also  contains irregularities.  Within each region there is up to 50% narrowing.  No high-grade obstruction is noted in the right coronary.   IV:  INTRAVASCULAR ULTRASOUND:  IVUS of the proximal one-half of the  circumflex demonstrates normal lumen diameter, a concentric proximal lesion  but adequate intravascular lumen throughout the entire imaged segment.   CONCLUSIONS:  1. Moderate diffuse plaquing in the left anterior descending, circumflex,     and right coronary.  No focal high-grade obstruction is noted.  2. Overall normal ejection fraction with inferobasal and anteroapical      hypokinesis.  3. Successful intravascular ultrasound for the CEPT study.  No complications     occurred.   PLAN:  Remove sheath when ACT less than 175.  Discharge September 16, 2002.                                                  Lesleigh Noe, M.D.    HWS/MEDQ  D:  09/15/2002  T:  09/15/2002  Job:  [id number]

## 2011-06-21 LAB — CK TOTAL AND CKMB (NOT AT ARMC)
CK, MB: 3.4
CK, MB: 5.4 — ABNORMAL HIGH
Relative Index: 1.9
Relative Index: 1.9
Total CK: 220 — ABNORMAL HIGH
Total CK: 284 — ABNORMAL HIGH

## 2011-06-21 LAB — I-STAT 8, (EC8 V) (CONVERTED LAB)
Acid-Base Excess: 1
Bicarbonate: 26.9 — ABNORMAL HIGH
HCT: 43
Operator id: 161631
TCO2: 28
pCO2, Ven: 44.7 — ABNORMAL LOW
pH, Ven: 7.387 — ABNORMAL HIGH

## 2011-06-21 LAB — CBC
HCT: 35.8 — ABNORMAL LOW
HCT: 39.5
Hemoglobin: 13.3
Platelets: 200
Platelets: 207
RBC: 4
RDW: 15.1
WBC: 6.3
WBC: 6.4

## 2011-06-21 LAB — COMPREHENSIVE METABOLIC PANEL
AST: 20
Albumin: 3.5
CO2: 28
Calcium: 9.1
Creatinine, Ser: 0.6
GFR calc Af Amer: >60
GFR calc non Af Amer: >60
Sodium: 142
Total Protein: 7

## 2011-06-21 LAB — PROTIME-INR
INR: 1
Prothrombin Time: 13.1

## 2011-06-21 LAB — POCT I-STAT CREATININE
Creatinine, Ser: 0.7
Operator id: 161631

## 2011-06-21 LAB — POCT CARDIAC MARKERS: Troponin i, poc: <0.05

## 2011-06-21 LAB — HEMOGLOBIN A1C: Mean Plasma Glucose: 175

## 2011-06-21 LAB — LIPID PANEL
Cholesterol: 193
Total CHOL/HDL Ratio: 5.1

## 2011-06-22 LAB — BASIC METABOLIC PANEL WITH GFR
BUN: 12
BUN: 15
CO2: 30
CO2: 30
Calcium: 9.3
Calcium: 9.4
Chloride: 104
Chloride: 106
Creatinine, Ser: 0.6
Creatinine, Ser: 0.62
GFR calc non Af Amer: >60
GFR calc non Af Amer: >60
Glucose, Bld: 104 — ABNORMAL HIGH
Glucose, Bld: 152 — ABNORMAL HIGH
Potassium: 3.7
Potassium: 5
Sodium: 138
Sodium: 143

## 2011-06-22 LAB — CBC
HCT: 32.8 — ABNORMAL LOW
HCT: 33.7 — ABNORMAL LOW
HCT: 34.2 — ABNORMAL LOW
MCHC: 33.9
MCV: 88.9
MCV: 89.6
Platelets: 164
Platelets: 177
RBC: 3.69 — ABNORMAL LOW
RBC: 3.77 — ABNORMAL LOW
WBC: 5.9
WBC: 6.1

## 2011-06-22 LAB — BASIC METABOLIC PANEL
BUN: 10
CO2: 29
Chloride: 107
Creatinine, Ser: 0.63

## 2015-05-02 HISTORY — PX: CARDIOVERSION: SHX1299

## 2015-12-18 ENCOUNTER — Emergency Department (INDEPENDENT_AMBULATORY_CARE_PROVIDER_SITE_OTHER)
Admission: EM | Admit: 2015-12-18 | Discharge: 2015-12-18 | Disposition: A | Discharge status: Home / Self Care | Payer: Medicare Other | Source: Home / Self Care | Attending: Emergency Medicine | Admitting: Emergency Medicine

## 2015-12-18 ENCOUNTER — Emergency Department (INDEPENDENT_AMBULATORY_CARE_PROVIDER_SITE_OTHER): Payer: Medicare Other

## 2015-12-18 ENCOUNTER — Encounter (HOSPITAL_COMMUNITY): Payer: Self-pay | Admitting: *Deleted

## 2015-12-18 ENCOUNTER — Emergency Department (HOSPITAL_COMMUNITY): Payer: Medicare Other

## 2015-12-18 DIAGNOSIS — S6992XA Unspecified injury of left wrist, hand and finger(s), initial encounter: Secondary | ICD-10-CM | POA: Diagnosis not present

## 2015-12-18 HISTORY — DX: Type 2 diabetes mellitus without complications: E11.9

## 2015-12-18 HISTORY — DX: Presence of cardiac pacemaker: Z95.0

## 2015-12-18 HISTORY — DX: Essential (primary) hypertension: I10

## 2015-12-18 HISTORY — DX: Chronic obstructive pulmonary disease, unspecified: J44.9

## 2015-12-18 NOTE — Discharge Instructions (Signed)
You have bruised your finger  There's no broken bones  Continue to treat symptoms  Wear splint  You may use tylenol and cold compresses for pain  See doctor for follow up if not steadily getting better.

## 2015-12-18 NOTE — ED Provider Notes (Addendum)
CSN: [id number]     Arrival date & time 12/18/15  48 History   First MD Initiated Contact with Patient 12/18/15 1656     Chief Complaint  Patient presents with  . Hand Injury   (Consider location/radiation/quality/duration/timing/severity/associated sxs/prior Treatment) HPI History obtained from patient:   LOCATION:left index finger  SEVERITY:3 DURATION:friday night CONTEXT:struck finger on marble counter top QUALITY: MODIFYING FACTORS: cold packs without relief ASSOCIATED SYMPTOMS:none TIMING:constant OCCUPATION:  Past Medical History  Diagnosis Date  . Diabetes mellitus without complication ([place])   . Hypertension   . COPD (chronic obstructive pulmonary disease) ([place])   . Pacemaker    Past Surgical History  Procedure Laterality Date  . Pacemaker insertion    . Gallstones    . Hernia repair     No family history on file. Social History  Substance Use Topics  . Smoking status: Current Every Day Smoker  . Smokeless tobacco: None  . Alcohol Use: None   OB History    No data available     Review of Systems Right finger injury Allergies  Review of patient's allergies indicates no known allergies.  Home Medications   Prior to Admission medications   Medication Sig Start Date End Date Taking? Authorizing Provider  aspirin 81 MG tablet Take 81 mg by mouth daily.   Yes Historical Provider, MD  atorvastatin (LIPITOR) 10 MG tablet Take 10 mg by mouth daily.   Yes Historical Provider, MD  carvedilol (COREG) 12.5 MG tablet Take 12.5 mg by mouth 2 (two) times daily with a meal.   Yes Historical Provider, MD  Choline Fenofibrate (FENOFIBRIC ACID) 45 MG CPDR Take 45 mg by mouth daily.   Yes Historical Provider, MD  dronedarone (MULTAQ) 400 MG tablet Take 400 mg by mouth 2 (two) times daily with a meal.   Yes Historical Provider, MD  esomeprazole (NEXIUM) 40 MG capsule Take 40 mg by mouth daily at 12 noon.   Yes Historical Provider, MD  glipiZIDE (GLUCOTROL) 10 MG tablet  Take 10 mg by mouth daily before breakfast.   Yes Historical Provider, MD  levothyroxine (SYNTHROID, LEVOTHROID) 125 MCG tablet Take 125 mcg by mouth daily before breakfast.   Yes Historical Provider, MD  linagliptin (TRADJENTA) 5 MG TABS tablet Take 5 mg by mouth daily.   Yes Historical Provider, MD  losartan (COZAAR) 100 MG tablet Take 100 mg by mouth daily.   Yes Historical Provider, MD  metoprolol (LOPRESSOR) 50 MG tablet Take 50 mg by mouth 2 (two) times daily.   Yes Historical Provider, MD  rivaroxaban (XARELTO) 20 MG TABS tablet Take 20 mg by mouth daily with supper.   Yes Historical Provider, MD   Meds Ordered and Administered this Visit  Medications - No data to display  BP 132/63 mmHg  Pulse 75  Temp(Src) 98 F (36.7 C) (Oral)  Resp 18  SpO2 97% No data found.   Physical Exam  Constitutional: She is oriented to person, place, and time. She appears well-developed and well-nourished. No distress.  HENT:  Head: Normocephalic and atraumatic.  Pulmonary/Chest: Effort normal.  Musculoskeletal: Normal range of motion. She exhibits tenderness.       Hands: Neurological: She is alert and oriented to person, place, and time.  Skin: Skin is warm and dry.  Psychiatric: She has a normal mood and affect. Her behavior is normal.  Nursing note and vitals reviewed.   ED Course  Procedures (including critical care time)  Labs Review Labs Reviewed - No data  to display  Imaging Review Dg Finger Index Left  12/18/2015  CLINICAL DATA:  PT HIT HER LT HAND ON A MARBLE TABLE AND HURT HER LT INDEX FINGER, PAIN IS AT THE BASE OF THE FINGER EXAM: LEFT INDEX FINGER 2+V COMPARISON:  None. FINDINGS: On the frontal and oblique views there is abnormal bone along the radial aspect of the metacarpal phalangeal joint of the index finger. This appears to arise off of the radial base of the proximal second phalanx. The lateral view is limited by overlapping osseous anatomy. IMPRESSION: Donor site  difficult to clearly visualize, with findings suggesting a fracture at the base of the second proximal phalanx. Anatomy may require CT scanning for optimal definition. Electronically Signed   By: Skipper Cliche M.D.   On: 12/18/2015 18:18     Visual Acuity Review  Right Eye Distance:   Left Eye Distance:   Bilateral Distance:    Right Eye Near:   Left Eye Near:    Bilateral Near:       Review of XR of Left index finger. Possible fracture  MDM   1. Finger injury, left, initial encounter     Patient is reassured that there are no issues that require transfer to higher level of care at this time or additional tests. Patient is advised to continue home symptomatic treatment. Patient is advised that if there are new or worsening symptoms to attend the emergency department, contact primary care provider, or return to UC. Instructions of care provided discharged home in stable condition. Return to work/school note provided.   THIS NOTE WAS GENERATED USING A VOICE RECOGNITION SOFTWARE PROGRAM. ALL REASONABLE EFFORTS  WERE MADE TO PROOFREAD THIS DOCUMENT FOR ACCURACY.  I have verbally reviewed the discharge instructions with the patient. A printed AVS was given to the patient.  All questions were answered prior to discharge.      Konrad Felix, [place] 12/18/15 [place], [place] 12/18/15 [phone]

## 2015-12-18 NOTE — ED Notes (Signed)
C/O accidentally hitting back of left hand on a marble table 2 days ago.  Pain to left posterior hand increasing, and now waking pt at night; area very tender to light palpation.  CMS intact.

## 2015-12-24 ENCOUNTER — Observation Stay (HOSPITAL_COMMUNITY)
Admission: EM | Admit: 2015-12-24 | Discharge: 2015-12-26 | Disposition: A | Discharge status: Home / Self Care | Payer: Medicare Other | Attending: Internal Medicine | Admitting: Internal Medicine

## 2015-12-24 ENCOUNTER — Encounter (HOSPITAL_COMMUNITY): Payer: Self-pay

## 2015-12-24 ENCOUNTER — Emergency Department (HOSPITAL_COMMUNITY): Payer: Medicare Other

## 2015-12-24 DIAGNOSIS — F17200 Nicotine dependence, unspecified, uncomplicated: Secondary | ICD-10-CM

## 2015-12-24 DIAGNOSIS — I2510 Atherosclerotic heart disease of native coronary artery without angina pectoris: Secondary | ICD-10-CM | POA: Insufficient documentation

## 2015-12-24 DIAGNOSIS — Z6824 Body mass index (BMI) 24.0-24.9, adult: Secondary | ICD-10-CM | POA: Insufficient documentation

## 2015-12-24 DIAGNOSIS — Z950 Presence of cardiac pacemaker: Secondary | ICD-10-CM | POA: Diagnosis not present

## 2015-12-24 DIAGNOSIS — F17210 Nicotine dependence, cigarettes, uncomplicated: Secondary | ICD-10-CM | POA: Diagnosis not present

## 2015-12-24 DIAGNOSIS — E669 Obesity, unspecified: Secondary | ICD-10-CM | POA: Insufficient documentation

## 2015-12-24 DIAGNOSIS — E039 Hypothyroidism, unspecified: Secondary | ICD-10-CM | POA: Diagnosis not present

## 2015-12-24 DIAGNOSIS — E785 Hyperlipidemia, unspecified: Secondary | ICD-10-CM | POA: Insufficient documentation

## 2015-12-24 DIAGNOSIS — J449 Chronic obstructive pulmonary disease, unspecified: Secondary | ICD-10-CM | POA: Diagnosis not present

## 2015-12-24 DIAGNOSIS — Z79899 Other long term (current) drug therapy: Secondary | ICD-10-CM | POA: Insufficient documentation

## 2015-12-24 DIAGNOSIS — N182 Chronic kidney disease, stage 2 (mild): Secondary | ICD-10-CM | POA: Diagnosis not present

## 2015-12-24 DIAGNOSIS — I495 Sick sinus syndrome: Secondary | ICD-10-CM | POA: Insufficient documentation

## 2015-12-24 DIAGNOSIS — Z7984 Long term (current) use of oral hypoglycemic drugs: Secondary | ICD-10-CM | POA: Insufficient documentation

## 2015-12-24 DIAGNOSIS — I129 Hypertensive chronic kidney disease with stage 1 through stage 4 chronic kidney disease, or unspecified chronic kidney disease: Secondary | ICD-10-CM | POA: Diagnosis not present

## 2015-12-24 DIAGNOSIS — E1122 Type 2 diabetes mellitus with diabetic chronic kidney disease: Secondary | ICD-10-CM | POA: Diagnosis not present

## 2015-12-24 DIAGNOSIS — Z955 Presence of coronary angioplasty implant and graft: Secondary | ICD-10-CM | POA: Diagnosis not present

## 2015-12-24 DIAGNOSIS — Z8639 Personal history of other endocrine, nutritional and metabolic disease: Secondary | ICD-10-CM

## 2015-12-24 DIAGNOSIS — Z7901 Long term (current) use of anticoagulants: Secondary | ICD-10-CM | POA: Diagnosis not present

## 2015-12-24 DIAGNOSIS — R079 Chest pain, unspecified: Principal | ICD-10-CM | POA: Insufficient documentation

## 2015-12-24 DIAGNOSIS — Z7982 Long term (current) use of aspirin: Secondary | ICD-10-CM | POA: Diagnosis not present

## 2015-12-24 DIAGNOSIS — I480 Paroxysmal atrial fibrillation: Secondary | ICD-10-CM | POA: Insufficient documentation

## 2015-12-24 DIAGNOSIS — I25119 Atherosclerotic heart disease of native coronary artery with unspecified angina pectoris: Secondary | ICD-10-CM | POA: Diagnosis not present

## 2015-12-24 DIAGNOSIS — E1169 Type 2 diabetes mellitus with other specified complication: Secondary | ICD-10-CM

## 2015-12-24 DIAGNOSIS — R0789 Other chest pain: Secondary | ICD-10-CM

## 2015-12-24 DIAGNOSIS — I200 Unstable angina: Secondary | ICD-10-CM | POA: Diagnosis present

## 2015-12-24 HISTORY — DX: Presence of cardiac pacemaker: Z95.0

## 2015-12-24 HISTORY — DX: Hypothyroidism, unspecified: E03.9

## 2015-12-24 HISTORY — DX: Paroxysmal atrial fibrillation: I48.0

## 2015-12-24 HISTORY — DX: Atherosclerotic heart disease of native coronary artery without angina pectoris: I25.10

## 2015-12-24 LAB — GLUCOSE, CAPILLARY
GLUCOSE-CAPILLARY: 156 mg/dL — AB (ref 65–99)
Glucose-Capillary: 107 mg/dL — ABNORMAL HIGH (ref 65–99)
Glucose-Capillary: 90 mg/dL (ref 65–99)

## 2015-12-24 LAB — HEPATIC FUNCTION PANEL
ALT: 14 U/L (ref 14–54)
AST: 15 U/L (ref 15–41)
Albumin: 3.2 g/dL — ABNORMAL LOW (ref 3.5–5.0)
Alkaline Phosphatase: 49 U/L (ref 38–126)
Bilirubin, Direct: <0.1 mg/dL — ABNORMAL LOW (ref 0.1–0.5)
TOTAL PROTEIN: 5.9 g/dL — AB (ref 6.5–8.1)
Total Bilirubin: 0.3 mg/dL (ref 0.3–1.2)

## 2015-12-24 LAB — CBC WITH DIFFERENTIAL/PLATELET
BASOS ABS: 0 10*3/uL (ref 0.0–0.1)
Basophils Relative: 0 %
EOS PCT: 2 %
Eosinophils Absolute: 0.1 10*3/uL (ref 0.0–0.7)
HCT: 33.5 % — ABNORMAL LOW (ref 36.0–46.0)
Hemoglobin: 10.5 g/dL — ABNORMAL LOW (ref 12.0–15.0)
LYMPHS PCT: 50 %
Lymphs Abs: 3.2 10*3/uL (ref 0.7–4.0)
MCH: 28.4 pg (ref 26.0–34.0)
MCHC: 31.3 g/dL (ref 30.0–36.0)
MCV: 90.5 fL (ref 78.0–100.0)
Monocytes Absolute: 0.4 10*3/uL (ref 0.1–1.0)
Monocytes Relative: 5 %
NEUTROS ABS: 2.8 10*3/uL (ref 1.7–7.7)
Neutrophils Relative %: 43 %
PLATELETS: 188 10*3/uL (ref 150–400)
RBC: 3.7 MIL/uL — AB (ref 3.87–5.11)
RDW: 13.5 % (ref 11.5–15.5)
WBC: 6.4 10*3/uL (ref 4.0–10.5)

## 2015-12-24 LAB — BASIC METABOLIC PANEL
ANION GAP: 7 (ref 5–15)
BUN: 21 mg/dL — ABNORMAL HIGH (ref 6–20)
CO2: 26 mmol/L (ref 22–32)
Calcium: 9 mg/dL (ref 8.9–10.3)
Chloride: 110 mmol/L (ref 101–111)
Creatinine, Ser: 1.13 mg/dL — ABNORMAL HIGH (ref 0.44–1.00)
GFR calc Af Amer: 53 mL/min — ABNORMAL LOW (ref >60)
GFR, EST NON AFRICAN AMERICAN: 45 mL/min — AB (ref >60)
GLUCOSE: 141 mg/dL — AB (ref 65–99)
POTASSIUM: 3.9 mmol/L (ref 3.5–5.1)
Sodium: 143 mmol/L (ref 135–145)

## 2015-12-24 LAB — I-STAT TROPONIN, ED: TROPONIN I, POC: 0.01 ng/mL (ref 0.00–0.08)

## 2015-12-24 LAB — TROPONIN I
Troponin I: <0.03 ng/mL (ref <0.031)
Troponin I: <0.03 ng/mL (ref <0.031)

## 2015-12-24 LAB — BRAIN NATRIURETIC PEPTIDE: B NATRIURETIC PEPTIDE 5: 57.2 pg/mL (ref 0.0–100.0)

## 2015-12-24 LAB — LIPASE, BLOOD: LIPASE: 24 U/L (ref 11–51)

## 2015-12-24 LAB — HEPARIN LEVEL (UNFRACTIONATED): Heparin Unfractionated: >2.2 IU/mL — ABNORMAL HIGH (ref 0.30–0.70)

## 2015-12-24 LAB — APTT: aPTT: 47 seconds — ABNORMAL HIGH (ref 24–37)

## 2015-12-24 MED ORDER — PANTOPRAZOLE SODIUM 40 MG PO TBEC
40.0000 mg | DELAYED_RELEASE_TABLET | Freq: Every day | ORAL | Status: DC
  Administered 2015-12-24 – 2015-12-26 (×3): 40 mg via ORAL
  Filled 2015-12-24 (×3): qty 1

## 2015-12-24 MED ORDER — INSULIN ASPART 100 UNIT/ML ~~LOC~~ SOLN
0.0000 [IU] | Freq: Three times a day (TID) | SUBCUTANEOUS | Status: DC
  Administered 2015-12-24 – 2015-12-25 (×2): 3 [IU] via SUBCUTANEOUS
  Administered 2015-12-26: 2 [IU] via SUBCUTANEOUS

## 2015-12-24 MED ORDER — LOSARTAN POTASSIUM 50 MG PO TABS
100.0000 mg | ORAL_TABLET | Freq: Every day | ORAL | Status: DC
  Administered 2015-12-24 – 2015-12-25 (×2): 100 mg via ORAL
  Filled 2015-12-24 (×2): qty 2

## 2015-12-24 MED ORDER — CARVEDILOL 12.5 MG PO TABS
12.5000 mg | ORAL_TABLET | Freq: Two times a day (BID) | ORAL | Status: DC
  Administered 2015-12-24 – 2015-12-26 (×5): 12.5 mg via ORAL
  Filled 2015-12-24 (×5): qty 1

## 2015-12-24 MED ORDER — RIVAROXABAN 20 MG PO TABS: 20.0000 mg | ORAL_TABLET | Freq: Every day | ORAL | Status: DC

## 2015-12-24 MED ORDER — LEVOTHYROXINE SODIUM 25 MCG PO TABS
125.0000 ug | ORAL_TABLET | Freq: Every day | ORAL | Status: DC
  Administered 2015-12-25 – 2015-12-26 (×2): 125 ug via ORAL
  Filled 2015-12-24 (×2): qty 1

## 2015-12-24 MED ORDER — ATORVASTATIN CALCIUM 10 MG PO TABS
10.0000 mg | ORAL_TABLET | Freq: Every day | ORAL | Status: DC
  Administered 2015-12-24 – 2015-12-26 (×3): 10 mg via ORAL
  Filled 2015-12-24 (×3): qty 1

## 2015-12-24 MED ORDER — ONDANSETRON HCL 4 MG/2ML IJ SOLN
4.0000 mg | Freq: Once | INTRAMUSCULAR | Status: AC
  Administered 2015-12-24: 4 mg via INTRAVENOUS
  Filled 2015-12-24: qty 2

## 2015-12-24 MED ORDER — FENTANYL CITRATE (PF) 100 MCG/2ML IJ SOLN
50.0000 ug | Freq: Once | INTRAMUSCULAR | Status: AC
  Administered 2015-12-24: 50 ug via INTRAVENOUS
  Filled 2015-12-24: qty 2

## 2015-12-24 MED ORDER — DRONEDARONE HCL 400 MG PO TABS
400.0000 mg | ORAL_TABLET | Freq: Two times a day (BID) | ORAL | Status: DC
  Administered 2015-12-24 – 2015-12-26 (×5): 400 mg via ORAL
  Filled 2015-12-24 (×6): qty 1

## 2015-12-24 MED ORDER — HEPARIN (PORCINE) IN NACL 100-0.45 UNIT/ML-% IJ SOLN
750.0000 [IU]/h | INTRAMUSCULAR | Status: DC
  Administered 2015-12-24 – 2015-12-25 (×2): 750 [IU]/h via INTRAVENOUS
  Filled 2015-12-24 (×2): qty 250

## 2015-12-24 MED ORDER — ACETAMINOPHEN 325 MG PO TABS: 650.0000 mg | ORAL_TABLET | ORAL | Status: DC | PRN

## 2015-12-24 MED ORDER — ASPIRIN EC 81 MG PO TBEC
81.0000 mg | DELAYED_RELEASE_TABLET | Freq: Every day | ORAL | Status: DC
  Administered 2015-12-25: 81 mg via ORAL
  Filled 2015-12-24 (×2): qty 1

## 2015-12-24 MED ORDER — ONDANSETRON HCL 4 MG/2ML IJ SOLN: 4.0000 mg | Freq: Four times a day (QID) | INTRAMUSCULAR | Status: DC | PRN

## 2015-12-24 NOTE — ED Notes (Signed)
Admitting MD at the bedside.  

## 2015-12-24 NOTE — ED Notes (Signed)
Pt transported upstairs by EMT Debbie at this time

## 2015-12-24 NOTE — ED Provider Notes (Signed)
TIME SEEN: 6:10 AM  CHIEF COMPLAINT: Chest pain  HPI: Pt is a 81 y.o. female with history of hypertension, diabetes, atrial fibrillation on Xarelto, COPD, status post pacemaker who presents to the emergency department with complaints of chest heaviness that woke her from sleep around 4 AM and lasted approximately 30 minutes. Pain radiated into her right neck and right shoulder. She did have associated diaphoresis. No shortness of breath, nausea vomiting, dizziness. States she has had history of cardiac disease and has stents in the diaphoresis made her very concerned. Cardiologist is Dr. Tamala Julian. Her last cardiac catheterization was several years ago. She did receive aspirin with EMS. She is not having chest pain currently. Denies fevers or cough. No lower extremity swelling or pain.  Patient did recently have an injury to the left second digit on March 19 and was seen in urgent care. Was found to have a fracture of the face of the second phalanx. Is currently an aluminum splint. She is complaining of pain to this area. Denies new injury.   PCP is [facility] is Dr. Tamala Julian  ROS: See HPI Constitutional: no fever  Eyes: no drainage  ENT: no runny nose   Cardiovascular:   chest pain  Resp: no SOB  GI: no vomiting GU: no dysuria Integumentary: no rash  Allergy: no hives  Musculoskeletal: no leg swelling  Neurological: no slurred speech ROS otherwise negative  PAST MEDICAL HISTORY/PAST SURGICAL HISTORY:  Past Medical History  Diagnosis Date  . Diabetes mellitus without complication ([place])   . Hypertension   . COPD (chronic obstructive pulmonary disease) ([place])   . Pacemaker     MEDICATIONS:  Prior to Admission medications   Medication Sig Start Date End Date Taking? Authorizing Provider  aspirin 81 MG tablet Take 81 mg by mouth daily.    Historical Provider, MD  atorvastatin (LIPITOR) 10 MG tablet Take 10 mg by mouth daily.    Historical Provider, MD  carvedilol  (COREG) 12.5 MG tablet Take 12.5 mg by mouth 2 (two) times daily with a meal.    Historical Provider, MD  Choline Fenofibrate (FENOFIBRIC ACID) 45 MG CPDR Take 45 mg by mouth daily.    Historical Provider, MD  dronedarone (MULTAQ) 400 MG tablet Take 400 mg by mouth 2 (two) times daily with a meal.    Historical Provider, MD  esomeprazole (NEXIUM) 40 MG capsule Take 40 mg by mouth daily at 12 noon.    Historical Provider, MD  glipiZIDE (GLUCOTROL) 10 MG tablet Take 10 mg by mouth daily before breakfast.    Historical Provider, MD  levothyroxine (SYNTHROID, LEVOTHROID) 125 MCG tablet Take 125 mcg by mouth daily before breakfast.    Historical Provider, MD  linagliptin (TRADJENTA) 5 MG TABS tablet Take 5 mg by mouth daily.    Historical Provider, MD  losartan (COZAAR) 100 MG tablet Take 100 mg by mouth daily.    Historical Provider, MD  metoprolol (LOPRESSOR) 50 MG tablet Take 50 mg by mouth 2 (two) times daily.    Historical Provider, MD  rivaroxaban (XARELTO) 20 MG TABS tablet Take 20 mg by mouth daily with supper.    Historical Provider, MD    ALLERGIES:  No Known Allergies  SOCIAL HISTORY:  Social History  Substance Use Topics  . Smoking status: Current Every Day Smoker -- 0.50 packs/day  . Smokeless tobacco: Not on file  . Alcohol Use: No    FAMILY HISTORY: No family history on file.  EXAM: BP 141/121  mmHg  Pulse 69  Temp(Src) 97.6 F (36.4 C)  Resp 16  SpO2 100% CONSTITUTIONAL: Alert and oriented and responds appropriately to questions. Elderly, appears uncomfortable HEAD: Normocephalic EYES: Conjunctivae clear, PERRL ENT: normal nose; no rhinorrhea; moist mucous membranes NECK: Supple, no meningismus, no LAD  CARD: RRR; S1 and S2 appreciated; no murmurs, no clicks, no rubs, no gallops RESP: Normal chest excursion without splinting or tachypnea; breath sounds clear and equal bilaterally; no wheezes, no rhonchi, no rales, no hypoxia or respiratory distress, speaking full  sentences ABD/GI: Normal bowel sounds; non-distended; soft, non-tender, no rebound, no guarding, no peritoneal signs BACK:  The back appears normal and is non-tender to palpation, there is no CVA tenderness EXT: Patient's second left digit is an aluminum splint and she is complaining to pain at the base of her finger. Normal ROM in all joints; otherwise extremities are non-tender to palpation; no edema; normal capillary refill; no cyanosis, no calf tenderness or swelling    SKIN: Normal color for age and race; warm; no rash NEURO: Moves all extremities equally, sensation to light touch intact diffusely, cranial nerves II through XII intact PSYCH: The patient's mood and manner are appropriate. Grooming and personal hygiene are appropriate.  MEDICAL DECISION MAKING: Patient here with a story of chest pain and diaphoresis that have resolved. Has a history of CAD with stents. EKG shows no new ischemic changes. She received aspirin with EMS. She is mildly hypertensive. We'll continue to monitor this closely. She is complaining of pain to her left second digit recent fracture 6 days ago. We'll give pain medication and then reassess her blood pressure. Will obtain cardiac labs, chest x-ray. I feel she will need admission for chest pain rule out given her significant history.  ED PROGRESS: 7:20 AM  Patient's labs are unremarkable. Chest x-ray clear. Still pain-free. Troponin is negative. BNP is normal. We'll discuss with hospitalist for admission.  7:30 AM  D/w Lily Kocher with hospitalist service who agrees on admission. Will admit to telemetry, observation bed. Care transferred to hospitalist service. Patient and her daughter at bedside have been updated with plan. Her blood pressure has improved with pain medication. She is now resting comfortably. Chest pain-free.    EKG Interpretation  Date/Time:  Saturday December 24 2015 05:38:24 EDT Ventricular Rate:  70 PR Interval:    QRS Duration: 174 QT  Interval:  527 QTC Calculation: 569 R Axis:   -32 Text Interpretation:  AV dual-paced rhythm No significant change since last tracing in 2011 Confirmed by Shawnette Augello,  DO, [place] [phone]) on 12/24/2015 5:41:54 AM        Delice Bison Kiron [place], DO 12/24/15 [id number]

## 2015-12-24 NOTE — Consult Note (Signed)
CARDIOLOGY CONSULT NOTE   Patient ID: [name] MRN: [id number] DOB/AGE: 79-Feb-1936 79 y.o.  Admit date: 12/24/2015  Requesting Physician: Dr. Eulas Post Primary Physician   No PCP Per Patient Primary Cardiologist   Dr. Tamala Julian Reason for Consultation  Chest pain  HPI: [name] is a 81 y.o. female with a history of CAD (PTCA to the RCA in 1997, DES to LCx 2005, moderate multivessel disease in 2009 and 2011), HTN, DM, atrial fibrillation (history of cardioversion, PPM, anticoagulated with Xarelto), and continued tobacco abuse who presents to the ED via EMS for evaluation of chest pain.   Her last heart cath was by Dr. Tamala Julian in 2011, which showed non obstructive CAD, no ISR in LCx BMS and normal LV function with inferior HK. She moved to [place] to be with her fianc who recently passed away. She now moved back to [place] to be closer to her daughter. Apparently she was followed by a cardiologist and electrophysiologist in [place]. Per patient report she had a lead revision done on her pacemaker in August 2016 as well as a cardioversion and was placed on Multaq. She was also told that her heart was weak. She has an appointment to see Dr. Tamala Julian in early April to reestablish care. Per patient, she has not had anymore caths or issues with CAD since 2011.   She was doing well until a couple days ago when she started noting right shoulder pain. She thought that this was due to sleeping funny. She also injured her right finger which has been causing her pain. Last night she was woken from sleep with right neck, shoulder pain and chest pressure as well as finger pain. The chest pressure was associated with profuse diaphoresis, light-headedness, and shortness of breath. No nausea or vomiting. No palpitations or syncope.The chest, neck and shoulder pain were reminiscent of previous cardiac angina. She called 911, and received full strength aspirin from EMS personnel. She did not require  NTG. Pain subsided after approximately 30 minutes.  Symptoms are somewhat atypical but are similar to symptoms that she had that she says prior to having her pacemaker replaced.  According to old records when she had her circumflex stent placed she had similar type symptoms.  She has had multiple catheterizations since then the last being in 2011.  Currently she is chest pain free but has some neck and shoulder discomfort. She denies recent LE edema, orthopnea or PND.   Past Medical History  Diagnosis Date  . Diabetes mellitus without complication ([place] [address])   . Hypertension   . COPD (chronic obstructive pulmonary disease) ([place])   . Pacemaker   . CAD (coronary artery disease), native coronary artery     PTCA of distal right coronary artery 1997 [place] stents to circumflex 2005 Cardiac cath in 2011 with patent stent to circumflex and moderate disease elsewhere treated medically   . Hypothyroidism 03/23/2010  . Paroxysmal atrial fibrillation (HCC) 03/23/2010    CHA2DS2VASC score 5   . Cardiac pacemaker in situ 12/24/2015    Original implant reportedly in 1991 for tachybradycardia syndrome, generator change in 1999, 2004 and evidently again in 2016 in [place]      Past Surgical History  Procedure Laterality Date  . Pacemaker insertion  1991  . Hernia repair    . Cholecystectomy    . Pacemaker generator change  [id number], 2016    No Known Allergies  Prior to Admission medications   Medication Sig Start Date  End Date Taking? Authorizing Provider  aspirin EC 81 MG tablet Take 81 mg by mouth daily.   Yes Historical Provider, MD  atorvastatin (LIPITOR) 10 MG tablet Take 10 mg by mouth daily.   Yes Historical Provider, MD  carvedilol (COREG) 12.5 MG tablet Take 12.5 mg by mouth 2 (two) times daily with a meal.   Yes Historical Provider, MD  Choline Fenofibrate (FENOFIBRIC ACID) 45 MG CPDR Take 45 mg by mouth daily.   Yes Historical Provider, MD  dronedarone (MULTAQ) 400 MG tablet Take 400 mg  by mouth 2 (two) times daily with a meal.   Yes Historical Provider, MD  esomeprazole (NEXIUM) 40 MG capsule Take 40 mg by mouth daily at 12 noon.   Yes Historical Provider, MD  glipiZIDE (GLUCOTROL) 10 MG tablet Take 10 mg by mouth daily before breakfast.   Yes Historical Provider, MD  levothyroxine (SYNTHROID, LEVOTHROID) 125 MCG tablet Take 125 mcg by mouth daily before breakfast.   Yes Historical Provider, MD  linagliptin (TRADJENTA) 5 MG TABS tablet Take 5 mg by mouth daily.   Yes Historical Provider, MD  losartan (COZAAR) 100 MG tablet Take 100 mg by mouth daily.   Yes Historical Provider, MD  metoprolol (LOPRESSOR) 50 MG tablet Take 50 mg by mouth 2 (two) times daily.   Yes Historical Provider, MD  rivaroxaban (XARELTO) 20 MG TABS tablet Take 20 mg by mouth daily with supper.   Yes Historical Provider, MD    Social History   Social History  . Marital Status: Widowed    Spouse Name: N/A  . Number of Children: N/A  . Years of Education: N/A   Occupational History  . Not on file.   Social History Main Topics  . Smoking status: Current Every Day Smoker -- 0.50 packs/day  . Smokeless tobacco: Never Used  . Alcohol Use: No  . Drug Use: No  . Sexual Activity: Not on file   Other Topics Concern  . Not on file   Social History Narrative    Family Status  Relation Status Death Age  . Father Deceased Late 75s    Old age  . Mother Deceased 22    MI  . Brother Deceased 80    Cirrhosis   Family History  Problem Relation Age of Onset  . Heart disease Mother   . Heart disease Father   . Cirrhosis Brother      ROS:  Full 14 point review of systems complete and found to be negative unless listed above.  Physical Exam: Blood pressure 110/51, pulse 70, temperature 98.5 F (36.9 C), temperature source Oral, resp. rate 15, SpO2 100 %.  General: Pleasant black female currently in no acute distress  Head: Eyes PERRLA, No xanthomas.   Normocephalic and atraumatic, oropharynx  without edema or exudate.  Lungs: CTAB Heart: HRRR S1 S2, no rub/gallop, Heart regular rate and rhythm with S1, S2  murmur. pulses are 2+ extrem.   Neck: No carotid bruits. No lymphadenopathy. No JVD. Abdomen: Bowel sounds present, abdomen soft and non-tender without masses or hernias noted. Msk:  No spine or cva tenderness. No weakness, no joint deformities or effusions. Extremities: No clubbing or cyanosis. NO LE  edema.  Neuro: Alert and oriented X 3. No focal deficits noted. Psych:  Good affect, responds appropriately Skin: No rashes or lesions noted.  Labs:   Lab Results  Component Value Date   WBC 6.4 12/24/2015   HGB 10.5* 12/24/2015   HCT 33.5* 12/24/2015  MCV 90.5 12/24/2015   PLT 188 12/24/2015    Recent Labs Lab 12/24/15 0611  NA 143  K 3.9  CL 110  CO2 26  BUN 21*  CREATININE 1.13*  CALCIUM 9.0  PROT 5.9*  BILITOT 0.3  ALKPHOS 49  ALT 14  AST 15  GLUCOSE 141*  ALBUMIN 3.2*    Recent Labs  12/24/15 0551  TROPIPOC 0.01   LIPASE  Date/Time Value Ref Range Status  12/24/2015 06:11 AM 24 11 - 51 U/L Final   Echo: pending  ECG:  AV paced.  Radiology:  Dg Chest 2 View  12/24/2015  CLINICAL DATA:  RIGHT-sided chest pain radiating to the back, now improved. History of COPD, hypertension, diabetes. EXAM: CHEST  2 VIEW COMPARISON:  CT chest July 13, 2010 FINDINGS: Cardiac silhouette is mildly enlarged, mediastinal silhouette is nonsuspicious. Increased lung volumes with flattened hemidiaphragm. Mild chronic interstitial changes and increased lung volumes compatible with known COPD. Bibasilar scarring. No pleural effusion or focal consolidation. No pneumothorax. RIGHT cardiac pacemaker in situ. Osteopenia. IMPRESSION: Mild cardiomegaly.  COPD and bibasilar scarring. Electronically Signed   By: Elon Alas M.D.   On: 12/24/2015 06:49    ASSESSMENT AND PLAN:    Principal Problem:   Atypical chest pain Active Problems:   CAD (coronary artery  disease), native coronary artery   Paroxysmal atrial fibrillation (HCC)   Cardiac pacemaker in situ   Diabetes mellitus without complication ([facility])   History of Graves' disease   Current use of long term anticoagulation  [name] is a 81 y.o. female with a history of CAD (PTCA to the RCA in 1997, DES to LCx 2005, moderate multivessel disease in 2009), HTN, DM, atrial fibrillation (history of cardioversion, PPM, anticoagulated with Xarelto), and active tobacco use who presents to the ED via EMS for evaluation of chest pain.   Chest pain consistent with unstable angina: atypical but reminiscent of previous cardiac chest pain prior to previous stenting. Troponin neg x1. ECG AV paced. Currently chest pain free. Will plan for LHC on Monday.  -- Continue ASA, statin and BB. Will start heparin for afib and stop Xarelto for heart cath.  PAF: s/p [employer] PPM. AV paced. She will need to be set up with our EP group. Continue Coreg and Multaq. CHADSVASC score at least 5 (HTN 1, Age 19, CAD 1, F sex1). Will hold Xarelto now with plans for cath on Monday. Will start IV heparin  Cardiomyopathy?: patient reports being told she had a weak heart in [place]. Will update 2D ECHO  HTN: Bp currently well controlled   HLD: continue atorvastatin '80mg'$ . If she does have progression of her CAD on cath, will plan to increase to high dose atorva '80mg'$   Recommendations:  Patient seen and examined with the PA and history updated as above.  Her Xarelto will be held and we will plan catheterization on Monday to evaluate the recurrent symptoms.   Signed: Ezzard Standing, MD 12/24/2015 11:41 AM

## 2015-12-24 NOTE — ED Notes (Signed)
Patient transported to X-ray 

## 2015-12-24 NOTE — ED Notes (Signed)
Spoke with PA. Pt to be NPO. Estill Bamberg, RN on 2 [place] called and verbalized understanding

## 2015-12-24 NOTE — H&P (Signed)
[facility] History and Physical  Nirvi Boehler MEQ:[id number] DOB: 1934/12/01 DOA: 12/24/2015  Specialists: Dr. Daneen Schick, Cardiology  Chief Complaint: Chest pain  HPI: [name] is a 81 y.o. woman with a history of CAD (PTCA to the RCA in 1997, DES to Left Circumflex 2005, moderate multivessel disease in 2009), HTN, DM, atrial fibrillation (history of cardioversion, PPM, anticoagulated with Xarelto), and active tobacco use who presents to the ED via EMS for evaluation of chest pain.  The patient was actually awake this morning because she had left finger pain related to recent injury.  She subsequently developed right sided chest pain, described as a pressure, that radiated to her neck and into her back, associated with profuse diaphoresis, light-headedness, and shortness of breath.  No nausea or vomiting.  No palpitations or syncope.  She called 911, and received full strength aspirin from EMS personnel.  She did not require NTG.  Pain subsided after approximately 30 minutes.  She admits that she has been having intermittent right shoulder pain, which she attributed to musculoskeletal origin, for several days.  EKG in the ED shows an AV paced rhythm, similar to prior tracings.  First troponin is negative.  She had transient hypotension after receiving IV narcotics for her finger.  She remains chest pain free.  Daughter [name] at bedside.  Review of Systems: 12 systems reviewed and negative except as stated in HPI.  Past Medical History  Diagnosis Date  . Diabetes mellitus without complication ([place])   . Hypertension   . COPD (chronic obstructive pulmonary disease) ([place])   . Pacemaker   . Atrial fibrillation [facility])    Past Surgical History  Procedure Laterality Date  . Pacemaker insertion    . Gallstones    . Hernia repair    . Cholecystectomy     Social History:  Social History   Social History Narrative  Active tobacco use.  No EtOH or illicit drug use.  Lived in [place]  for several years; came back to [place] after her husband passed away in October 28, 2022.  She has four adult children.  She lives with her daughter [name].  No Known Allergies  Family History  Problem Relation Age of Onset  . Heart disease Mother   . Heart disease Father   . Cirrhosis Brother    Prior to Admission medications   Medication Sig Start Date End Date Taking? Authorizing Provider  aspirin EC 81 MG tablet Take 81 mg by mouth daily.   Yes Historical Provider, MD  atorvastatin (LIPITOR) 10 MG tablet Take 10 mg by mouth daily.   Yes Historical Provider, MD  carvedilol (COREG) 12.5 MG tablet Take 12.5 mg by mouth 2 (two) times daily with a meal.   Yes Historical Provider, MD  Choline Fenofibrate (FENOFIBRIC ACID) 45 MG CPDR Take 45 mg by mouth daily.   Yes Historical Provider, MD  dronedarone (MULTAQ) 400 MG tablet Take 400 mg by mouth 2 (two) times daily with a meal.   Yes Historical Provider, MD  esomeprazole (NEXIUM) 40 MG capsule Take 40 mg by mouth daily at 12 noon.   Yes Historical Provider, MD  glipiZIDE (GLUCOTROL) 10 MG tablet Take 10 mg by mouth daily before breakfast.   Yes Historical Provider, MD  levothyroxine (SYNTHROID, LEVOTHROID) 125 MCG tablet Take 125 mcg by mouth daily before breakfast.   Yes Historical Provider, MD  linagliptin (TRADJENTA) 5 MG TABS tablet Take 5 mg by mouth daily.   Yes Historical Provider, MD  losartan (  COZAAR) 100 MG tablet Take 100 mg by mouth daily.   Yes Historical Provider, MD  metoprolol (LOPRESSOR) 50 MG tablet Take 50 mg by mouth 2 (two) times daily.   Yes Historical Provider, MD  rivaroxaban (XARELTO) 20 MG TABS tablet Take 20 mg by mouth daily with supper.   Yes Historical Provider, MD   Physical Exam: Filed Vitals:   12/24/15 0700 12/24/15 0732 12/24/15 0800 12/24/15 0837  BP: 98/51 103/51 112/83 110/51  Pulse: 76 70 70 70  Temp:    98.5 F (36.9 C)  TempSrc:    Oral  Resp: '15 16 15 15  '$ SpO2: 100% 99% 100% 100%     General:   Awake and alert.  Oriented to person, place, time and situation.  NAD.  Eyes: PERRL bilaterally, conjunctiva are pink.  EOMI.  ENT: Mucous membranes are dry.  Neck: Supple.   Cardiovascular: NR/RR.  No LE edema.  Respiratory: Bibasilar crackles/  Abdomen: Soft/NT/ND.  Bowel sounds are present.  No guarding.  Skin: Warm and dry.  Musculoskeletal: Moves all four extremities spontaneously.  Psychiatric: Normal affect.  Neurologic: No focal deficits.   Labs on Admission:  Basic Metabolic Panel:  Recent Labs Lab 12/24/15 0611  NA 143  K 3.9  CL 110  CO2 26  GLUCOSE 141*  BUN 21*  CREATININE 1.13*  CALCIUM 9.0   Liver Function Tests:  Recent Labs Lab 12/24/15 0611  AST 15  ALT 14  ALKPHOS 49  BILITOT 0.3  PROT 5.9*  ALBUMIN 3.2*    Recent Labs Lab 12/24/15 0611  LIPASE 24   CBC:  Recent Labs Lab 12/24/15 0611  WBC 6.4  NEUTROABS 2.8  HGB 10.5*  HCT 33.5*  MCV 90.5  PLT 188   BNP (last 3 results)  Recent Labs  12/24/15 0611  BNP 57.2   Radiological Exams on Admission: Dg Chest 2 View  12/24/2015  CLINICAL DATA:  RIGHT-sided chest pain radiating to the back, now improved. History of COPD, hypertension, diabetes. EXAM: CHEST  2 VIEW COMPARISON:  CT chest July 13, 2010 FINDINGS: Cardiac silhouette is mildly enlarged, mediastinal silhouette is nonsuspicious. Increased lung volumes with flattened hemidiaphragm. Mild chronic interstitial changes and increased lung volumes compatible with known COPD. Bibasilar scarring. No pleural effusion or focal consolidation. No pneumothorax. RIGHT cardiac pacemaker in situ. Osteopenia. IMPRESSION: Mild cardiomegaly.  COPD and bibasilar scarring. Electronically Signed   By: Elon Alas M.D.   On: 12/24/2015 06:49    EKG: Independently reviewed. AV paced.  Assessment/Plan Principal Problem:   Atypical chest pain Active Problems:   TOBACCO ABUSE   Coronary atherosclerosis   Atrial fibrillation  (HCC)  Admit to telemetry  Heart score of at least five for moderately suspicious story, age, risk factors  Atypical chest pain concerning for unstable angina --Continue baby aspirin daily --Continue home doses of Coreg and Multaq (I held metoprolol for now; not sure why she is on two beta blockers) --Continue Xarelto for history of atrial fibrillation --Serial troponin --Complete echo (cardiomegaly noted on chest xray) --Dr. Jenkins Rouge accepted cardiology consult on behalf of [place].  Anticipate she will need LHC.  Active tobacco use --Smoking cessation counseling prior to discharge  DM --A1c --Hold home meds --SSI coverage while in the hospital  Code Status: FULL Family Communication: Daughter [name] at bedside Disposition Plan: Observation status for now, but will likely be here until Monday if she needs LHC.  Time spent: 70 minutes  [facility]  12/24/2015, 8:43 AM

## 2015-12-24 NOTE — Progress Notes (Signed)
ANTICOAGULATION CONSULT NOTE - Initial Consult  Pharmacy Consult for heparin Indication: chest pain/ACS and atrial fibrillation  No Known Allergies  Patient Measurements: Wt= 66.7kg Ht ~ 5' 2'' IBW= 50.1kg   Vital Signs: Temp: 98.5 F (36.9 C) (03/25 0837) Temp Source: Oral (03/25 0837) BP: 110/51 mmHg (03/25 0837) Pulse Rate: 70 (03/25 0837)  Labs:  Recent Labs  12/24/15 0611  HGB 10.5*  HCT 33.5*  PLT 188  CREATININE 1.13*    CrCl cannot be calculated (Unknown ideal weight.).   Medical History: Past Medical History  Diagnosis Date  . Diabetes mellitus without complication ([place])   . Hypertension   . COPD (chronic obstructive pulmonary disease) ([place])   . Pacemaker     Medications:  Prescriptions prior to admission  Medication Sig Dispense Refill Last Dose  . aspirin EC 81 MG tablet Take 81 mg by mouth daily.   12/23/2015 at Unknown time  . atorvastatin (LIPITOR) 10 MG tablet Take 10 mg by mouth daily.   12/23/2015 at Unknown time  . carvedilol (COREG) 12.5 MG tablet Take 12.5 mg by mouth 2 (two) times daily with a meal.   12/23/2015 at 1900  . Choline Fenofibrate (FENOFIBRIC ACID) 45 MG CPDR Take 45 mg by mouth daily.   12/23/2015 at Unknown time  . dronedarone (MULTAQ) 400 MG tablet Take 400 mg by mouth 2 (two) times daily with a meal.   12/23/2015 at Unknown time  . esomeprazole (NEXIUM) 40 MG capsule Take 40 mg by mouth daily at 12 noon.   12/23/2015 at Unknown time  . glipiZIDE (GLUCOTROL) 10 MG tablet Take 10 mg by mouth daily before breakfast.   12/23/2015 at Unknown time  . levothyroxine (SYNTHROID, LEVOTHROID) 125 MCG tablet Take 125 mcg by mouth daily before breakfast.   12/23/2015 at Unknown time  . linagliptin (TRADJENTA) 5 MG TABS tablet Take 5 mg by mouth daily.   12/23/2015 at Unknown time  . losartan (COZAAR) 100 MG tablet Take 100 mg by mouth daily.   12/23/2015 at Unknown time  . metoprolol (LOPRESSOR) 50 MG tablet Take 50 mg by mouth 2 (two) times  daily.   12/23/2015 at 1900  . rivaroxaban (XARELTO) 20 MG TABS tablet Take 20 mg by mouth daily with supper.   12/23/2015 at 1900   Scheduled:  . [START ON 12/25/2015] aspirin EC  81 mg Oral Daily  . atorvastatin  10 mg Oral Daily  . carvedilol  12.5 mg Oral BID WC  . dronedarone  400 mg Oral BID WC  . insulin aspart  0-15 Units Subcutaneous TID WC  . [START ON 12/25/2015] levothyroxine  125 mcg Oral QAC breakfast  . losartan  100 mg Oral Daily  . pantoprazole  40 mg Oral Daily    Assessment: 81 yo female with CP to begin heparin for r/o ACS. She is noted on Xarelto '20mg'$ /day PTA with last dose on 3/24 at about 7pm)  Goal of Therapy:  Heparin level 0.3-0.7 units/ml aPTT 66-102 seconds Monitor platelets by anticoagulation protocol: Yes   Plan:  -No heparin bolus -Begin heparin at 750 units/hr at 7pm -baseline aPTT and HL -aPTT in 6 hours daily with CBC and heparin level daily  Hildred Laser, Pharm D 12/24/2015 11:44 AM

## 2015-12-24 NOTE — ED Notes (Signed)
Per EMS pt called due to waking up with chest pain on right side with radiation to back; Pt was clammy on EMS arrival; Pt has pacemaker/defib at paced rhythm; Pt started complaints of upper abdominal pain and chest pain had resolve prior to ED arrival; Pt started complaining of left hand pain on arrival to ED but denies chest pain; pt has cracked bone in left hand and presents with splint on left index finger; Pt is A&O x 4 on arrival.

## 2015-12-24 NOTE — ED Notes (Signed)
MD at bedside. 

## 2015-12-25 ENCOUNTER — Observation Stay (HOSPITAL_BASED_OUTPATIENT_CLINIC_OR_DEPARTMENT_OTHER): Payer: Medicare Other

## 2015-12-25 DIAGNOSIS — I200 Unstable angina: Secondary | ICD-10-CM | POA: Diagnosis not present

## 2015-12-25 DIAGNOSIS — R079 Chest pain, unspecified: Secondary | ICD-10-CM | POA: Diagnosis not present

## 2015-12-25 LAB — ECHOCARDIOGRAM COMPLETE
HEIGHTINCHES: 62 in
Weight: 2155.2 oz

## 2015-12-25 LAB — PROTIME-INR
INR: 1.14 (ref 0.00–1.49)
Prothrombin Time: 14.8 seconds (ref 11.6–15.2)

## 2015-12-25 LAB — CBC
HEMATOCRIT: 33.5 % — AB (ref 36.0–46.0)
Hemoglobin: 10.4 g/dL — ABNORMAL LOW (ref 12.0–15.0)
MCH: 28.2 pg (ref 26.0–34.0)
MCHC: 31 g/dL (ref 30.0–36.0)
MCV: 90.8 fL (ref 78.0–100.0)
Platelets: 192 10*3/uL (ref 150–400)
RBC: 3.69 MIL/uL — ABNORMAL LOW (ref 3.87–5.11)
RDW: 13.8 % (ref 11.5–15.5)
WBC: 5.8 10*3/uL (ref 4.0–10.5)

## 2015-12-25 LAB — APTT
APTT: 74 s — AB (ref 24–37)
APTT: 93 s — AB (ref 24–37)

## 2015-12-25 LAB — GLUCOSE, CAPILLARY
GLUCOSE-CAPILLARY: 97 mg/dL (ref 65–99)
GLUCOSE-CAPILLARY: 146 mg/dL — AB (ref 65–99)
Glucose-Capillary: 163 mg/dL — ABNORMAL HIGH (ref 65–99)
Glucose-Capillary: 146 mg/dL — ABNORMAL HIGH (ref 65–99)

## 2015-12-25 LAB — TROPONIN I: Troponin I: <0.03 ng/mL (ref <0.031)

## 2015-12-25 MED ORDER — SODIUM CHLORIDE 0.9% FLUSH: 3.0000 mL | INTRAVENOUS | Status: DC | PRN

## 2015-12-25 MED ORDER — ASPIRIN 81 MG PO CHEW
81.0000 mg | CHEWABLE_TABLET | ORAL | Status: AC
  Administered 2015-12-26: 81 mg via ORAL

## 2015-12-25 MED ORDER — DOCUSATE SODIUM 100 MG PO CAPS
200.0000 mg | ORAL_CAPSULE | Freq: Two times a day (BID) | ORAL | Status: DC
  Administered 2015-12-25 – 2015-12-26 (×3): 200 mg via ORAL
  Filled 2015-12-25 (×3): qty 2

## 2015-12-25 MED ORDER — SODIUM CHLORIDE 0.9 % WEIGHT BASED INFUSION: 1.0000 mL/kg/h | INTRAVENOUS | Status: DC

## 2015-12-25 MED ORDER — SODIUM CHLORIDE 0.9% FLUSH
3.0000 mL | Freq: Two times a day (BID) | INTRAVENOUS | Status: DC
Start: 2015-12-25 — End: 2015-12-26

## 2015-12-25 MED ORDER — SODIUM CHLORIDE 0.9 % WEIGHT BASED INFUSION
3.0000 mL/kg/h | INTRAVENOUS | Status: DC
  Administered 2015-12-26: 3 mL/kg/h via INTRAVENOUS

## 2015-12-25 MED ORDER — SODIUM CHLORIDE 0.9 % IV SOLN: 250.0000 mL | INTRAVENOUS | Status: DC | PRN

## 2015-12-25 MED ORDER — SODIUM CHLORIDE 0.9% FLUSH: 3.0000 mL | Freq: Two times a day (BID) | INTRAVENOUS | Status: DC

## 2015-12-25 MED ORDER — LOSARTAN POTASSIUM 50 MG PO TABS: 50.0000 mg | ORAL_TABLET | Freq: Every day | ORAL | Status: DC

## 2015-12-25 MED ORDER — POLYETHYLENE GLYCOL 3350 17 G PO PACK
17.0000 g | PACK | Freq: Two times a day (BID) | ORAL | Status: DC
  Administered 2015-12-25 – 2015-12-26 (×3): 17 g via ORAL
  Filled 2015-12-25 (×3): qty 1

## 2015-12-25 MED ORDER — SODIUM CHLORIDE 0.9 % WEIGHT BASED INFUSION: 3.0000 mL/kg/h | INTRAVENOUS | Status: DC

## 2015-12-25 MED ORDER — ASPIRIN 81 MG PO CHEW
81.0000 mg | CHEWABLE_TABLET | ORAL | Status: AC
  Filled 2015-12-25: qty 1

## 2015-12-25 NOTE — Progress Notes (Signed)
ANTICOAGULATION CONSULT NOTE - Follow Up Consult  Pharmacy Consult for heparin Indication: chest pain/ACS and atrial fibrillation  Labs:  Recent Labs  12/24/15 0611 12/24/15 1342 12/24/15 1610 12/24/15 2151 12/25/15 0339  HGB 10.5*  --   --   --  10.4*  HCT 33.5*  --   --   --  33.5*  PLT 188  --   --   --  192  APTT  --  47*  --   --  74*  LABPROT  --   --   --   --  14.8  INR  --   --   --   --  1.14  HEPARINUNFRC  --  >2.20*  --   --   --   CREATININE 1.13*  --   --   --   --   TROPONINI  --   --  <0.03 <0.03 <0.03     Assessment/Plan:  81yo female therapeutic on heparin with initial dosing while Xarelto on hold. Will continue gtt at current rate and confirm stable with additional PTT.   Wynona Neat, PharmD, BCPS  12/25/2015,5:55 AM

## 2015-12-25 NOTE — Progress Notes (Signed)
Received call from [place] regarding pt's strip looking unusual.  She is pacing and currently having her echocardiogram.  She is comfortable in room and no complaints of distress or discomfort at this time. Will continue to monitor.

## 2015-12-25 NOTE — Progress Notes (Signed)
Subjective:  Still with some vague right neck and shoulder symptoms this morning.  No tenderness noted on exam.  Objective:  Vital Signs in the last 24 hours: BP 104/50 mmHg  Pulse 78  Temp(Src) 98 F (36.7 C) (Oral)  Resp 18  Ht '5\' 2"'$  (1.575 m)  Wt 61.1 kg (134 lb 11.2 oz)  BMI 24.63 kg/m2  SpO2 99%  Physical Exam: Elderly black female mildly obese in no acute distress Lungs:  Clear Cardiac:  Regular rhythm, normal S1 and S2, no S3 Extremities:  No edema present  Intake/Output from previous day: 03/25 0701 - 03/26 0700 In: 690 [P.O.:600; I.V.:90] Out: -   Weight Filed Weights   12/25/15 0600  Weight: 61.1 kg (134 lb 11.2 oz)    Lab Results: Basic Metabolic Panel:  Recent Labs  12/24/15 0611  NA 143  K 3.9  CL 110  CO2 26  GLUCOSE 141*  BUN 21*  CREATININE 1.13*   CBC:  Recent Labs  12/24/15 0611 12/25/15 0339  WBC 6.4 5.8  NEUTROABS 2.8  --   HGB 10.5* 10.4*  HCT 33.5* 33.5*  MCV 90.5 90.8  PLT 188 192   Cardiac Enzymes: Troponin (Point of Care Test)  Recent Labs  12/24/15 0551  TROPIPOC 0.01   Cardiac Panel (last 3 results)  Recent Labs  12/24/15 1610 12/24/15 2151 12/25/15 0339  TROPONINI <0.03 <0.03 <0.03    Telemetry: Currently  paced rhythm  Assessment/Plan:  1.  Right neck and shoulder pain similar to previous ischemic symptoms but persistent today-troponins are currently negative 2.  Coronary artery disease with previous stent to the circumflex and PCI to the right coronary artery 3.  Functioning permanent pacemaker.  Recommendations:  In light of paced rhythm and ongoing symptoms plan catheterization tomorrow. Cardiac catheterization was discussed with the patient fully including risks of myocardial infarction, death, stroke, bleeding, arrhythmia, dye allergy, renal insufficiency or bleeding.  The patient understands and is willing to proceed.  She is currently on heparin and her Xarelto has been stopped.  Pacemaker will  need to be interrogated.  It is possible that the neck symptoms may be due to cervical disc disease since there are ongoing today in the setting of normal troponins.      Kerry Hough  MD [facility]  12/25/2015, 8:55 AM

## 2015-12-25 NOTE — Progress Notes (Signed)
TRH Progress Note                                            Patient Demographics:    [name], is a 81 y.o. female, DOB - 07-10-1935, OLM:[id number]  Admit date - 12/24/2015   Admitting Physician Lily Kocher, MD  Outpatient Primary MD for the patient is No PCP Per Patient  LOS -   Outpatient Specialists: Dr. Daneen Schick  Chief Complaint  Patient presents with  . Chest Pain        Subjective:    [name] today has, No headache, No chest pain, No abdominal pain - No Nausea, No new weakness tingling or numbness, No Cough - SOB. Does have some left-sided shoulder and neck pain.   Assessment  & Plan :    1.Chest pain in a patient with history of CAD and stent placement in the past. Ruled out for MI, seen by cardiology, currently chest pain-free but does have some left shoulder and neck pain which is her angina equivalent. Currently on combination of aspirin, beta blocker, statin and ARB for secondary prevention, currently on IV heparin drip per cardiology, due for left heart catheterization on 12/26/2015.  2. Paroxysmal atrial fibrillation. [place] vasc 2 score of 5 or above. Goal will be rate controlled, on beta blocker, Multaq and xaralto, xaralto currently on hold for left heart catheterization. On IV heparin for now.  3. Essential hypertension. Blood pressure on the soft side, currently on Coreg, cutdown ARB dose in half and monitor.  4. Dyslipidemia. On statin continue.  5. History of ongoing smoking. Counseled to quit.   6. CK D stage II. Monitor creatinine. On ARB dose cut in half.   7. DM type II. Check A1c. Hold oral hypoglycemic  agent, currently on sliding scale.  Lab Results  Component Value Date   HGBA1C * 03/12/2010    8.0 (NOTE)                                                                       According to the ADA Clinical Practice Recommendations for 2011, when HbA1c is used as a screening test:   >=6.5%   Diagnostic of Diabetes Mellitus           (if abnormal result  is confirmed)  5.7-6.4%   Increased risk of developing Diabetes Mellitus  References:Diagnosis and Classification of Diabetes Mellitus,Diabetes EFEO,7121,97(JOITG 1):S62-S69 and Standards of Medical Care  in         Diabetes - 2011,Diabetes Care,2011,34  (Suppl 1):S11-S61.   CBG (last 3)   Recent Labs  12/24/15 1608 12/24/15 2112 12/25/15 0638  GLUCAP 156* 90 97     Code Status : Full  Family Communication  : None  Disposition Plan  : Home in 1-2 days  Barriers For Discharge : Left heart catheterization  Consults  :  Cardiology  Procedures  :   Left heart catheterization due on 12/26/2015  DVT Prophylaxis  : Heparin    Lab Results  Component Value Date   PLT 192 12/25/2015    Antibiotics  :   Anti-infectives    None        Objective:   Filed Vitals:   12/24/15 1445 12/24/15 1945 12/25/15 0300 12/25/15 0600  BP: 102/45 108/57 104/50   Pulse: 70 70 78   Temp: 98.2 F (36.8 C) 98.3 F (36.8 C) 98 F (36.7 C)   TempSrc: Oral Oral Oral   Resp: '18 18 18   '$ Height:    '5\' 2"'$  (1.575 m)  Weight:    61.1 kg (134 lb 11.2 oz)  SpO2: 99% 97% 99%     Wt Readings from Last 3 Encounters:  12/25/15 61.1 kg (134 lb 11.2 oz)  03/23/10 66.679 kg (147 lb)     Intake/Output Summary (Last 24 hours) at 12/25/15 0931 Last data filed at 12/25/15 0800  Gross per 24 hour  Intake    690 ml  Output      0 ml  Net    690 ml     Physical Exam  Awake Alert, Oriented X 3, No new F.N deficits, Normal affect [place].AT,PERRAL Supple Neck,No JVD, No cervical lymphadenopathy appriciated.  Symmetrical Chest wall movement, Good  air movement bilaterally, CTAB RRR,No Gallops,Rubs or new Murmurs, No Parasternal Heave +ve B.Sounds, Abd Soft, No tenderness, No organomegaly appriciated, No rebound - guarding or rigidity. No Cyanosis, Clubbing or edema, No new Rash or bruise      Data Review:    CBC  Recent Labs Lab 12/24/15 0611 12/25/15 0339  WBC 6.4 5.8  HGB 10.5* 10.4*  HCT 33.5* 33.5*  PLT 188 192  MCV 90.5 90.8  MCH 28.4 28.2  MCHC 31.3 31.0  RDW 13.5 13.8  LYMPHSABS 3.2  --   MONOABS 0.4  --   EOSABS 0.1  --   BASOSABS 0.0  --     Chemistries   Recent Labs Lab 12/24/15 0611  NA 143  K 3.9  CL 110  CO2 26  GLUCOSE 141*  BUN 21*  CREATININE 1.13*  CALCIUM 9.0  AST 15  ALT 14  ALKPHOS 49  BILITOT 0.3   ------------------------------------------------------------------------------------------------------------------ No results for input(s): CHOL, HDL, LDLCALC, TRIG, CHOLHDL, LDLDIRECT in the last 72 hours.  Lab Results  Component Value Date   HGBA1C * 03/12/2010    8.0 (NOTE)                                                                       According to the ADA Clinical Practice Recommendations for 2011, when HbA1c is used as a screening test:   >=6.5%   Diagnostic of Diabetes Mellitus           (  if abnormal result  is confirmed)  5.7-6.4%   Increased risk of developing Diabetes Mellitus  References:Diagnosis and Classification of Diabetes Mellitus,Diabetes NATF,5732,20(URKYH 1):S62-S69 and Standards of Medical Care in         Diabetes - 2011,Diabetes CWCB,7628,31  (Suppl 1):S11-S61.   CBG (last 3)   Recent Labs  12/24/15 1608 12/24/15 2112 12/25/15 0638  GLUCAP 156* 90 97     ------------------------------------------------------------------------------------------------------------------ No results for input(s): TSH, T4TOTAL, T3FREE, THYROIDAB in the last 72 hours.  Invalid input(s):  FREET3 ------------------------------------------------------------------------------------------------------------------ No results for input(s): VITAMINB12, FOLATE, FERRITIN, TIBC, IRON, RETICCTPCT in the last 72 hours.  Coagulation profile  Recent Labs Lab 12/25/15 0339  INR 1.14    No results for input(s): DDIMER in the last 72 hours.  Cardiac Enzymes  Recent Labs Lab 12/24/15 1610 12/24/15 2151 12/25/15 0339  TROPONINI <0.03 <0.03 <0.03   ------------------------------------------------------------------------------------------------------------------    Component Value Date/Time   BNP 57.2 12/24/2015 0611    Inpatient Medications  Scheduled Meds: . [START ON 12/26/2015] aspirin  81 mg Oral Pre-Cath  . aspirin EC  81 mg Oral Daily  . atorvastatin  10 mg Oral Daily  . carvedilol  12.5 mg Oral BID WC  . dronedarone  400 mg Oral BID WC  . insulin aspart  0-15 Units Subcutaneous TID WC  . levothyroxine  125 mcg Oral QAC breakfast  . [START ON 12/28/2015] losartan  50 mg Oral Daily  . pantoprazole  40 mg Oral Daily  . sodium chloride flush  3 mL Intravenous Q12H   Continuous Infusions: . [START ON 12/26/2015] sodium chloride     Followed by  . [START ON 12/26/2015] sodium chloride    . heparin 750 Units/hr (12/24/15 1658)   PRN Meds:.sodium chloride, acetaminophen, ondansetron (ZOFRAN) IV, sodium chloride flush  Micro Results No results found for this or any previous visit (from the past 240 hour(s)).  Radiology Reports Dg Chest 2 View  12/24/2015  CLINICAL DATA:  RIGHT-sided chest pain radiating to the back, now improved. History of COPD, hypertension, diabetes. EXAM: CHEST  2 VIEW COMPARISON:  CT chest July 13, 2010 FINDINGS: Cardiac silhouette is mildly enlarged, mediastinal silhouette is nonsuspicious. Increased lung volumes with flattened hemidiaphragm. Mild chronic interstitial changes and increased lung volumes compatible with known COPD. Bibasilar  scarring. No pleural effusion or focal consolidation. No pneumothorax. RIGHT cardiac pacemaker in situ. Osteopenia. IMPRESSION: Mild cardiomegaly.  COPD and bibasilar scarring. Electronically Signed   By: Elon Alas M.D.   On: 12/24/2015 06:49   Dg Finger Index Left  12/18/2015  CLINICAL DATA:  PT HIT HER LT HAND ON A MARBLE TABLE AND HURT HER LT INDEX FINGER, PAIN IS AT THE BASE OF THE FINGER EXAM: LEFT INDEX FINGER 2+V COMPARISON:  None. FINDINGS: On the frontal and oblique views there is abnormal bone along the radial aspect of the metacarpal phalangeal joint of the index finger. This appears to arise off of the radial base of the proximal second phalanx. The lateral view is limited by overlapping osseous anatomy. IMPRESSION: Donor site difficult to clearly visualize, with findings suggesting a fracture at the base of the second proximal phalanx. Anatomy may require CT scanning for optimal definition. Electronically Signed   By: Skipper Cliche M.D.   On: 12/18/2015 18:18    Time Spent in minutes  35   [name] K M.D on 12/25/2015 at 9:31 AM  Between 7am to 7pm - Pager - [phone]  After 7pm go to [email or web address] - password  [facility] -  Office  [phone]

## 2015-12-25 NOTE — Progress Notes (Signed)
ANTICOAGULATION CONSULT NOTE - Initial Consult  Pharmacy Consult for heparin Indication: chest pain/ACS and atrial fibrillation  No Known Allergies  Patient Measurements: Wt= 66.7kg Ht ~ 5' 2'' IBW= 50.1kg   Vital Signs: Temp: 98 F (36.7 C) (03/26 0300) Temp Source: Oral (03/26 0300) BP: 104/50 mmHg (03/26 0300) Pulse Rate: 78 (03/26 0300)  Labs:  Recent Labs  12/24/15 0611 12/24/15 1342 12/24/15 1610 12/24/15 2151 12/25/15 0339 12/25/15 0800  HGB 10.5*  --   --   --  10.4*  --   HCT 33.5*  --   --   --  33.5*  --   PLT 188  --   --   --  192  --   APTT  --  47*  --   --  74* 93*  LABPROT  --   --   --   --  14.8  --   INR  --   --   --   --  1.14  --   HEPARINUNFRC  --  >2.20*  --   --   --   --   CREATININE 1.13*  --   --   --   --   --   TROPONINI  --   --  <0.03 <0.03 <0.03  --     Estimated Creatinine Clearance: 35.3 mL/min (by C-G formula based on Cr of 1.13).   Assessment: 81 yo female with CP to begin heparin for r/o ACS. She is noted on Xarelto '20mg'$ /day PTA with last dose on 3/24 at about 7pm). Dosing for now based on aPTTs  APTT 93 (therapeutic x2), Hgb 10.4, plt wnl  Goal of Therapy:  Heparin level 0.3-0.7 units/ml aPTT 66-102 seconds Monitor platelets by anticoagulation protocol: Yes   Plan:  -Continue heparin at 750 units/hr  -Daily aPTT/ HL and CBC   Darl Pikes, PharmD Clinical Pharmacist- Resident Pager: [phone]  12/25/2015 9:24 AM

## 2015-12-26 ENCOUNTER — Encounter (HOSPITAL_COMMUNITY)
Admission: EM | Disposition: A | Discharge status: Home / Self Care | Payer: Self-pay | Source: Home / Self Care | Attending: Emergency Medicine

## 2015-12-26 DIAGNOSIS — I25119 Atherosclerotic heart disease of native coronary artery with unspecified angina pectoris: Secondary | ICD-10-CM

## 2015-12-26 DIAGNOSIS — R0789 Other chest pain: Secondary | ICD-10-CM | POA: Diagnosis not present

## 2015-12-26 DIAGNOSIS — E119 Type 2 diabetes mellitus without complications: Secondary | ICD-10-CM | POA: Diagnosis not present

## 2015-12-26 DIAGNOSIS — Z950 Presence of cardiac pacemaker: Secondary | ICD-10-CM

## 2015-12-26 DIAGNOSIS — I200 Unstable angina: Secondary | ICD-10-CM | POA: Diagnosis not present

## 2015-12-26 DIAGNOSIS — I2510 Atherosclerotic heart disease of native coronary artery without angina pectoris: Secondary | ICD-10-CM | POA: Diagnosis not present

## 2015-12-26 HISTORY — PX: CARDIAC CATHETERIZATION: SHX172

## 2015-12-26 LAB — CBC
HCT: 33.1 % — ABNORMAL LOW (ref 36.0–46.0)
Hemoglobin: 10.7 g/dL — ABNORMAL LOW (ref 12.0–15.0)
MCH: 29.3 pg (ref 26.0–34.0)
MCHC: 32.3 g/dL (ref 30.0–36.0)
MCV: 90.7 fL (ref 78.0–100.0)
PLATELETS: 172 10*3/uL (ref 150–400)
RBC: 3.65 MIL/uL — ABNORMAL LOW (ref 3.87–5.11)
RDW: 13.7 % (ref 11.5–15.5)
WBC: 5.3 10*3/uL (ref 4.0–10.5)

## 2015-12-26 LAB — HEMOGLOBIN A1C
HEMOGLOBIN A1C: 7.6 % — AB (ref 4.8–5.6)
HEMOGLOBIN A1C: 7.3 % — AB (ref 4.8–5.6)
MEAN PLASMA GLUCOSE: 163 mg/dL
Mean Plasma Glucose: 171 mg/dL

## 2015-12-26 LAB — GLUCOSE, CAPILLARY
Glucose-Capillary: 149 mg/dL — ABNORMAL HIGH (ref 65–99)
Glucose-Capillary: 97 mg/dL (ref 65–99)
Glucose-Capillary: 158 mg/dL — ABNORMAL HIGH (ref 65–99)

## 2015-12-26 LAB — BASIC METABOLIC PANEL
ANION GAP: 8 (ref 5–15)
BUN: 21 mg/dL — AB (ref 6–20)
CALCIUM: 9.6 mg/dL (ref 8.9–10.3)
CO2: 26 mmol/L (ref 22–32)
Chloride: 108 mmol/L (ref 101–111)
Creatinine, Ser: 1.02 mg/dL — ABNORMAL HIGH (ref 0.44–1.00)
GFR calc Af Amer: 59 mL/min — ABNORMAL LOW (ref >60)
GFR, EST NON AFRICAN AMERICAN: 51 mL/min — AB (ref >60)
GLUCOSE: 174 mg/dL — AB (ref 65–99)
Potassium: 4.2 mmol/L (ref 3.5–5.1)
Sodium: 142 mmol/L (ref 135–145)

## 2015-12-26 LAB — HEPARIN LEVEL (UNFRACTIONATED): HEPARIN UNFRACTIONATED: 0.37 [IU]/mL (ref 0.30–0.70)

## 2015-12-26 LAB — APTT: aPTT: 81 seconds — ABNORMAL HIGH (ref 24–37)

## 2015-12-26 SURGERY — LEFT HEART CATH AND CORONARY ANGIOGRAPHY
Anesthesia: LOCAL

## 2015-12-26 MED ORDER — SODIUM CHLORIDE 0.9 % WEIGHT BASED INFUSION: 3.0000 mL/kg/h | INTRAVENOUS | Status: AC

## 2015-12-26 MED ORDER — MIDAZOLAM HCL 2 MG/2ML IJ SOLN
INTRAMUSCULAR | Status: AC
  Filled 2015-12-26: qty 2

## 2015-12-26 MED ORDER — HEPARIN (PORCINE) IN NACL 2-0.9 UNIT/ML-% IJ SOLN
INTRAMUSCULAR | Status: DC | PRN
  Administered 2015-12-26: 09:00:00

## 2015-12-26 MED ORDER — HEPARIN (PORCINE) IN NACL 2-0.9 UNIT/ML-% IJ SOLN
INTRAMUSCULAR | Status: AC
Start: 2015-12-26 — End: 2015-12-26
  Filled 2015-12-26: qty 500

## 2015-12-26 MED ORDER — LIDOCAINE HCL (PF) 1 % IJ SOLN
INTRAMUSCULAR | Status: AC
  Filled 2015-12-26: qty 30

## 2015-12-26 MED ORDER — HEPARIN SODIUM (PORCINE) 1000 UNIT/ML IJ SOLN
INTRAMUSCULAR | Status: DC | PRN
  Administered 2015-12-26: 3500 [IU] via INTRAVENOUS

## 2015-12-26 MED ORDER — FENTANYL CITRATE (PF) 100 MCG/2ML IJ SOLN
INTRAMUSCULAR | Status: DC | PRN
  Administered 2015-12-26: 25 ug via INTRAVENOUS

## 2015-12-26 MED ORDER — HEPARIN (PORCINE) IN NACL 2-0.9 UNIT/ML-% IJ SOLN
INTRAMUSCULAR | Status: DC | PRN
  Administered 2015-12-26: 10 mL via INTRA_ARTERIAL

## 2015-12-26 MED ORDER — SODIUM CHLORIDE 0.9% FLUSH: 3.0000 mL | INTRAVENOUS | Status: DC | PRN

## 2015-12-26 MED ORDER — IOPAMIDOL (ISOVUE-370) INJECTION 76%
INTRAVENOUS | Status: AC
  Filled 2015-12-26: qty 100

## 2015-12-26 MED ORDER — MIDAZOLAM HCL 2 MG/2ML IJ SOLN
INTRAMUSCULAR | Status: DC | PRN
  Administered 2015-12-26: 1 mg via INTRAVENOUS

## 2015-12-26 MED ORDER — SODIUM CHLORIDE 0.9% FLUSH
3.0000 mL | Freq: Two times a day (BID) | INTRAVENOUS | Status: DC
  Administered 2015-12-26: 3 mL via INTRAVENOUS

## 2015-12-26 MED ORDER — HEPARIN SODIUM (PORCINE) 1000 UNIT/ML IJ SOLN
INTRAMUSCULAR | Status: AC
  Filled 2015-12-26: qty 1

## 2015-12-26 MED ORDER — RIVAROXABAN 20 MG PO TABS: 20.0000 mg | ORAL_TABLET | Freq: Every day | ORAL | Status: DC

## 2015-12-26 MED ORDER — SODIUM CHLORIDE 0.9 % IV SOLN: 250.0000 mL | INTRAVENOUS | Status: DC | PRN

## 2015-12-26 MED ORDER — VERAPAMIL HCL 2.5 MG/ML IV SOLN
INTRAVENOUS | Status: AC
  Filled 2015-12-26: qty 2

## 2015-12-26 MED ORDER — LIDOCAINE HCL (PF) 1 % IJ SOLN
INTRAMUSCULAR | Status: DC | PRN
  Administered 2015-12-26: 3 mL

## 2015-12-26 MED ORDER — IOPAMIDOL (ISOVUE-370) INJECTION 76%
INTRAVENOUS | Status: DC | PRN
  Administered 2015-12-26: 60 mL

## 2015-12-26 MED ORDER — HEPARIN (PORCINE) IN NACL 2-0.9 UNIT/ML-% IJ SOLN
INTRAMUSCULAR | Status: AC
  Filled 2015-12-26: qty 500

## 2015-12-26 MED ORDER — FENTANYL CITRATE (PF) 100 MCG/2ML IJ SOLN
INTRAMUSCULAR | Status: AC
  Filled 2015-12-26: qty 2

## 2015-12-26 SURGICAL SUPPLY — 11 items

## 2015-12-26 NOTE — H&P (View-Only) (Signed)
Subjective:  Still with some vague right neck and shoulder symptoms this morning.  No tenderness noted on exam.  Objective:  Vital Signs in the last 24 hours: BP 104/50 mmHg  Pulse 78  Temp(Src) 98 F (36.7 C) (Oral)  Resp 18  Ht '5\' 2"'$  (1.575 m)  Wt 61.1 kg (134 lb 11.2 oz)  BMI 24.63 kg/m2  SpO2 99%  Physical Exam: Elderly black female mildly obese in no acute distress Lungs:  Clear Cardiac:  Regular rhythm, normal S1 and S2, no S3 Extremities:  No edema present  Intake/Output from previous day: 03/25 0701 - 03/26 0700 In: 690 [P.O.:600; I.V.:90] Out: -   Weight Filed Weights   12/25/15 0600  Weight: 61.1 kg (134 lb 11.2 oz)    Lab Results: Basic Metabolic Panel:  Recent Labs  12/24/15 0611  NA 143  K 3.9  CL 110  CO2 26  GLUCOSE 141*  BUN 21*  CREATININE 1.13*   CBC:  Recent Labs  12/24/15 0611 12/25/15 0339  WBC 6.4 5.8  NEUTROABS 2.8  --   HGB 10.5* 10.4*  HCT 33.5* 33.5*  MCV 90.5 90.8  PLT 188 192   Cardiac Enzymes: Troponin (Point of Care Test)  Recent Labs  12/24/15 0551  TROPIPOC 0.01   Cardiac Panel (last 3 results)  Recent Labs  12/24/15 1610 12/24/15 2151 12/25/15 0339  TROPONINI <0.03 <0.03 <0.03    Telemetry: Currently  paced rhythm  Assessment/Plan:  1.  Right neck and shoulder pain similar to previous ischemic symptoms but persistent today-troponins are currently negative 2.  Coronary artery disease with previous stent to the circumflex and PCI to the right coronary artery 3.  Functioning permanent pacemaker.  Recommendations:  In light of paced rhythm and ongoing symptoms plan catheterization tomorrow. Cardiac catheterization was discussed with the patient fully including risks of myocardial infarction, death, stroke, bleeding, arrhythmia, dye allergy, renal insufficiency or bleeding.  The patient understands and is willing to proceed.  She is currently on heparin and her Xarelto has been stopped.  Pacemaker will  need to be interrogated.  It is possible that the neck symptoms may be due to cervical disc disease since there are ongoing today in the setting of normal troponins.      Kerry Hough  MD [facility]  12/25/2015, 8:55 AM

## 2015-12-26 NOTE — Discharge Instructions (Signed)
Follow with Primary MD in 7 days   Get CBC, CMP, 2 view Chest X ray checked  by Primary MD next visit.    Activity: As tolerated with Full fall precautions use walker/cane & assistance as needed   Disposition Home     Diet:   Heart Healthy Low Carb.  Accuchecks 4 times/day, Once in AM empty stomach and then before each meal. Log in all results and show them to your Prim.MD in 3 days. If any glucose reading is under 80 or above 300 call your Prim MD immidiately. Follow Low glucose instructions for glucose under 80 as instructed.   For Heart failure patients - Check your Weight same time everyday, if you gain over 2 pounds, or you develop in leg swelling, experience more shortness of breath or chest pain, call your Primary MD immediately. Follow Cardiac Low Salt Diet and 1.5 lit/day fluid restriction.   On your next visit with your primary care physician please Get Medicines reviewed and adjusted.   Please request your Prim.MD to go over all Hospital Tests and Procedure/Radiological results at the follow up, please get all Hospital records sent to your Prim MD by signing hospital release before you go home.   If you experience worsening of your admission symptoms, develop shortness of breath, life threatening emergency, suicidal or homicidal thoughts you must seek medical attention immediately by calling 911 or calling your MD immediately  if symptoms less severe.  You Must read complete instructions/literature along with all the possible adverse reactions/side effects for all the Medicines you take and that have been prescribed to you. Take any new Medicines after you have completely understood and accpet all the possible adverse reactions/side effects.   Do not drive, operating heavy machinery, perform activities at heights, swimming or participation in water activities or provide baby sitting services if your were admitted for syncope or siezures until you have seen by Primary MD or  a Neurologist and advised to do so again.  Do not drive when taking Pain medications.    Do not take more than prescribed Pain, Sleep and Anxiety Medications  Special Instructions: If you have smoked or chewed Tobacco  in the last 2 yrs please stop smoking, stop any regular Alcohol  and or any Recreational drug use.  Wear Seat belts while driving.   Please note  You were cared for by a hospitalist during your hospital stay. If you have any questions about your discharge medications or the care you received while you were in the hospital after you are discharged, you can call the unit and asked to speak with the hospitalist on call if the hospitalist that took care of you is not available. Once you are discharged, your primary care physician will handle any further medical issues. Please note that NO REFILLS for any discharge medications will be authorized once you are discharged, as it is imperative that you return to your primary care physician (or establish a relationship with a primary care physician if you do not have one) for your aftercare needs so that they can reassess your need for medications and monitor your lab values.

## 2015-12-26 NOTE — Discharge Summary (Signed)
[name], is a 81 y.o. female  DOB [birth year]  MRN [id number].  Admission date:  12/24/2015  Admitting Physician  Lily Kocher, MD  Discharge Date:  12/26/2015   Primary MD  No PCP Per Patient  Recommendations for primary care physician for things to follow:   Monitor secondary risk factors for CAD   Admission Diagnosis  Chest pain, unspecified chest pain type [R07.9]   Discharge Diagnosis  Chest pain, unspecified chest pain type [R07.9]     Principal Problem:   Unstable angina [facility]) Active Problems:   CAD (coronary artery disease), native coronary artery   Paroxysmal atrial fibrillation ([place])   Cardiac pacemaker in situ   Diabetes mellitus without complication ([place])   History of Graves' disease   Current use of long term anticoagulation      Past Medical History  Diagnosis Date  . Diabetes mellitus without complication ([place])   . Hypertension   . COPD (chronic obstructive pulmonary disease) ([place])   . Pacemaker   . CAD (coronary artery disease), native coronary artery     PTCA of distal right coronary artery 1997 Cypher stents to circumflex 2005 Cardiac cath in 2011 with patent stent to circumflex and moderate disease elsewhere treated medically   . Hypothyroidism 03/23/2010  . Paroxysmal atrial fibrillation ([facility]) 03/23/2010    CHA2DS2VASC score 5   . Cardiac pacemaker in situ 12/24/2015    Original implant reportedly in 1991 for tachybradycardia syndrome, generator change in 1999, 2004 and evidently again in 2016 in [place]     Past Surgical History  Procedure Laterality Date  . Pacemaker insertion  1991  . Hernia repair    . Cholecystectomy    . Pacemaker generator change  [id number], 2016       HPI  from the history and physical done on the day of admission:   [name] is a 82 y.o.  woman with a history of CAD (PTCA to the RCA in 1997, DES to Left Circumflex 2005, moderate multivessel disease in 2009), HTN, DM, atrial fibrillation (history of cardioversion, PPM, anticoagulated with Xarelto), and active tobacco use who presents to the ED via EMS for evaluation of chest pain. The patient was actually awake this morning because she had left finger pain related to recent injury. She subsequently developed right sided chest pain, described as a pressure, that radiated to her neck and into her back, associated with profuse diaphoresis, light-headedness, and shortness of breath. No nausea or vomiting. No palpitations or syncope. She called 911, and received full strength aspirin from EMS personnel. She did not require NTG. Pain subsided after approximately 30 minutes. She admits that she has been having intermittent right shoulder pain, which she attributed to musculoskeletal origin, for several days.  EKG in the ED shows an AV paced rhythm, similar to prior tracings. First troponin is negative. She had transient hypotension after receiving IV narcotics for her finger. She remains chest pain free. Daughter [name] at bedside.      Hospital Course:  1.Chest pain in a patient with history of CAD and stent placement in the past. Ruled out for MI, seen by cardiology, currently chest pain-free but did have some left shoulder and neck pain which could have been her angina equivalent. She underwent echogram and left heart catheterization which were both nonacute, her EF was 60% without any wall motion abnormalities. Left heart catheterization showed nonocclusive CAD. Recommendation is to continue full medical treatment which includes her previous home medications which are combination of aspirin, beta blocker, statin and ARB for secondary prevention, resume home dose xaralto.  2. Paroxysmal atrial fibrillation. [place] vasc 2 score of 5 or above. Goal will be rate control, on beta  blocker, Multaq and xaralto.  3. Essential hypertension. On Coreg and ARB. PCP to monitor and adjust.  4. Dyslipidemia. On statin continue.  5. History of ongoing smoking. Counseled to quit.   6. CK D stage II. At baseline.  7. DM type II. A1c was 7.3, continue home regimen PCP to monitor glycemic control.     Discharge Condition: Stable  Follow UP  Follow-up Information    Follow up with PCP in 7 days.   Why:  cbc, bmp check      Follow up with [name], MD. Schedule an appointment as soon as possible for a visit in 1 week.   Specialty:  Cardiology   Contact information:   [address] [address] [phone]        [place] and Activity recommendation: See Discharge Instructions below  Discharge Instructions           Discharge Instructions    Discharge instructions    Complete by:  As directed   Follow with Primary MD in 7 days   Get CBC, CMP, 2 view Chest X ray checked  by Primary MD next visit.    Activity: As tolerated with Full fall precautions use walker/cane & assistance as needed   Disposition Home     Diet:   Heart Healthy Low Carb.  Accuchecks 4 times/day, Once in AM empty stomach and then before each meal. Log in all results and show them to your Prim.MD in 3 days. If any glucose reading is under 80 or above 300 call your Prim MD immidiately. Follow Low glucose instructions for glucose under 80 as instructed.   For Heart failure patients - Check your Weight same time everyday, if you gain over 2 pounds, or you develop in leg swelling, experience more shortness of breath or chest pain, call your Primary MD immediately. Follow Cardiac Low Salt Diet and 1.5 lit/day fluid restriction.   On your next visit with your primary care physician please Get Medicines reviewed and adjusted.   Please request your Prim.MD to go over all Hospital Tests and Procedure/Radiological results at  the follow up, please get all Hospital records sent to your Prim MD by signing hospital release before you go home.   If you experience worsening of your admission symptoms, develop shortness of breath, life threatening emergency, suicidal or homicidal thoughts you must seek medical attention immediately by calling 911 or calling your MD immediately  if symptoms less severe.  You Must read complete instructions/literature along with all the possible adverse reactions/side effects for all the Medicines you take and that have been prescribed to you. Take any new Medicines after you have completely understood and accpet all the possible adverse reactions/side effects.   Do not drive,  operating heavy machinery, perform activities at heights, swimming or participation in water activities or provide baby sitting services if your were admitted for syncope or siezures until you have seen by Primary MD or a Neurologist and advised to do so again.  Do not drive when taking Pain medications.    Do not take more than prescribed Pain, Sleep and Anxiety Medications  Special Instructions: If you have smoked or chewed Tobacco  in the last 2 yrs please stop smoking, stop any regular Alcohol  and or any Recreational drug use.  Wear Seat belts while driving.   Please note  You were cared for by a hospitalist during your hospital stay. If you have any questions about your discharge medications or the care you received while you were in the hospital after you are discharged, you can call the unit and asked to speak with the hospitalist on call if the hospitalist that took care of you is not available. Once you are discharged, your primary care physician will handle any further medical issues. Please note that NO REFILLS for any discharge medications will be authorized once you are discharged, as it is imperative that you return to your primary care physician (or establish a relationship with a primary care physician  if you do not have one) for your aftercare needs so that they can reassess your need for medications and monitor your lab values.     Increase activity slowly    Complete by:  As directed              Discharge Medications       Medication List    STOP taking these medications        metoprolol 50 MG tablet  Commonly known as:  LOPRESSOR      TAKE these medications        aspirin EC 81 MG tablet  Take 81 mg by mouth daily.     atorvastatin 10 MG tablet  Commonly known as:  LIPITOR  Take 10 mg by mouth daily.     carvedilol 12.5 MG tablet  Commonly known as:  COREG  Take 12.5 mg by mouth 2 (two) times daily with a meal.     dronedarone 400 MG tablet  Commonly known as:  MULTAQ  Take 400 mg by mouth 2 (two) times daily with a meal.     esomeprazole 40 MG capsule  Commonly known as:  NEXIUM  Take 40 mg by mouth daily at 12 noon.     Fenofibric Acid 45 MG Cpdr  Take 45 mg by mouth daily.     glipiZIDE 10 MG tablet  Commonly known as:  GLUCOTROL  Take 10 mg by mouth daily before breakfast.     levothyroxine 125 MCG tablet  Commonly known as:  SYNTHROID, LEVOTHROID  Take 125 mcg by mouth daily before breakfast.     losartan 100 MG tablet  Commonly known as:  COZAAR  Take 100 mg by mouth daily.     rivaroxaban 20 MG Tabs tablet  Commonly known as:  XARELTO  Take 20 mg by mouth daily with supper.     TRADJENTA 5 MG Tabs tablet  Generic drug:  linagliptin  Take 5 mg by mouth daily.        Major procedures and Radiology Reports - PLEASE review detailed and final reports for all details, in brief -   L.Heart Cath   Prox RCA to Dist RCA lesion, 30% stenosed.  Prox LAD  lesion, 40% stenosed.  Mid LAD to Dist LAD lesion, 45% stenosed.  Ost Cx to Prox Cx lesion, 10% stenosed. The lesion was previously treated with a drug-eluting stent greater than two years ago.  The left ventricular systolic function is normal.  1. Nonobstructive coronary artery  disease- diffuse. 2. Good LV function  Plan: continue medical management.   TTE  Left ventricle: The cavity size was normal. Systolic function was normal. The estimated ejection fraction was in the range of 55% to 60%. Wall motion was normal; there were no regional wall motion abnormalities. The study is not technically sufficient to allow evaluation of LV diastolic function. - Aortic valve: Transvalvular velocity was within the normal range.There was no stenosis. There was mild to moderate regurgitation. - Mitral valve: There was no regurgitation. - Left atrium: The atrium was severely dilated. - Right ventricle: The cavity size was normal. Wall thickness was  normal. Systolic function was normal. - Right atrium: The atrium was severely dilated. - Tricuspid valve: There was mild regurgitation. - Pulmonary arteries: Systolic pressure was within the normal  range. PA peak pressure: 23 mm Hg (S).     Dg Chest 2 View  12/24/2015  CLINICAL DATA:  RIGHT-sided chest pain radiating to the back, now improved. History of COPD, hypertension, diabetes. EXAM: CHEST  2 VIEW COMPARISON:  CT chest July 13, 2010 FINDINGS: Cardiac silhouette is mildly enlarged, mediastinal silhouette is nonsuspicious. Increased lung volumes with flattened hemidiaphragm. Mild chronic interstitial changes and increased lung volumes compatible with known COPD. Bibasilar scarring. No pleural effusion or focal consolidation. No pneumothorax. RIGHT cardiac pacemaker in situ. Osteopenia. IMPRESSION: Mild cardiomegaly.  COPD and bibasilar scarring. Electronically Signed   By: Elon Alas M.D.   On: 12/24/2015 06:49   Dg Finger Index Left  12/18/2015  CLINICAL DATA:  PT HIT HER LT HAND ON A MARBLE TABLE AND HURT HER LT INDEX FINGER, PAIN IS AT THE BASE OF THE FINGER EXAM: LEFT INDEX FINGER 2+V COMPARISON:  None. FINDINGS: On the frontal and oblique views there is abnormal bone along the radial aspect of the metacarpal  phalangeal joint of the index finger. This appears to arise off of the radial base of the proximal second phalanx. The lateral view is limited by overlapping osseous anatomy. IMPRESSION: Donor site difficult to clearly visualize, with findings suggesting a fracture at the base of the second proximal phalanx. Anatomy may require CT scanning for optimal definition. Electronically Signed   By: Skipper Cliche M.D.   On: 12/18/2015 18:18    Micro Results      No results found for this or any previous visit (from the past 240 hour(s)).     Today   Subjective    [name] today has no headache,no chest abdominal pain,no new weakness tingling or numbness, feels much better wants to go home today.     Objective   Blood pressure 133/62, pulse 66, temperature 98 F (36.7 C), temperature source Oral, resp. rate 16, height '5\' 2"'$  (1.575 m), weight 61.1 kg (134 lb 11.2 oz), SpO2 96 %.   Intake/Output Summary (Last 24 hours) at 12/26/15 0947 Last data filed at 12/26/15 0700  Gross per 24 hour  Intake    360 ml  Output      0 ml  Net    360 ml    Exam Awake Alert, Oriented x 3, No new F.N deficits, Normal affect [place].AT,PERRAL Supple Neck,No JVD, No cervical lymphadenopathy appriciated.  Symmetrical Chest wall  movement, Good air movement bilaterally, CTAB RRR,No Gallops,Rubs or new Murmurs, No Parasternal Heave +ve B.Sounds, Abd Soft, Non tender, No organomegaly appriciated, No rebound -guarding or rigidity. No Cyanosis, Clubbing or edema, No new Rash or bruise   Data Review   CBC w Diff:  Lab Results  Component Value Date   WBC 5.3 12/26/2015   HGB 10.7* 12/26/2015   HCT 33.1* 12/26/2015   PLT 172 12/26/2015   LYMPHOPCT 50 12/24/2015   MONOPCT 5 12/24/2015   EOSPCT 2 12/24/2015   BASOPCT 0 12/24/2015    CMP:  Lab Results  Component Value Date   NA 142 12/26/2015   K 4.2 12/26/2015   CL 108 12/26/2015   CO2 26 12/26/2015   BUN 21* 12/26/2015   CREATININE 1.02*  12/26/2015   PROT 5.9* 12/24/2015   ALBUMIN 3.2* 12/24/2015   BILITOT 0.3 12/24/2015   ALKPHOS 49 12/24/2015   AST 15 12/24/2015   ALT 14 12/24/2015  . Lab Results  Component Value Date   HGBA1C 7.3* 12/25/2015   CBG (last 3)   Recent Labs  12/25/15 1614 12/25/15 2029 12/26/15 0555  GLUCAP 163* 146* 149*      Total Time in preparing paper work, data evaluation and todays exam - 35 minutes  Thurnell Lose M.D on 12/26/2015 at 9:47 AM  [facility]   Office  [phone]

## 2015-12-26 NOTE — Interval H&P Note (Signed)
History and Physical Interval Note:  12/26/2015 8:36 AM  [name]  has presented today for surgery, with the diagnosis of unstable angina  The various methods of treatment have been discussed with the patient and family. After consideration of risks, benefits and other options for treatment, the patient has consented to  Procedure(s): Left Heart Cath and Coronary Angiography (N/A) as a surgical intervention .  The patient's history has been reviewed, patient examined, no change in status, stable for surgery.  I have reviewed the patient's chart and labs.  Questions were answered to the patient's satisfaction.   Cath Lab Visit (complete for each Cath Lab visit)  Clinical Evaluation Leading to the Procedure:   ACS: Yes.    Non-ACS:    Anginal Classification: CCS III  Anti-ischemic medical therapy: Minimal Therapy (1 class of medications)  Non-Invasive Test Results: No non-invasive testing performed  Prior CABG: No previous CABG        Peter [name] MD,FACC 12/26/2015 8:36 AM

## 2015-12-26 NOTE — Care Management Obs Status (Signed)
[place] NOTIFICATION   Patient Details  Name: [name] MRN: [id number] Date of Birth: [birth year]   Medicare Observation Status Notification Given:  Yes    Dawayne Patricia, RN 12/26/2015, 11:11 AM

## 2015-12-26 NOTE — Progress Notes (Signed)
       Patient Name: [name] Date of Encounter: 12/26/2015    SUBJECTIVE: Data reviewed while the patient was on cath table. Reviewed coronary angioma with Dr. [name]. Coronaries are patent without obvious evidence of lesion that would produce ischemia/pain. Certainly there is no disease that would produce pain at rest.  TELEMETRY:  Paced rhythm Filed Vitals:   12/26/15 1015 12/26/15 1030 12/26/15 1045 12/26/15 1100  BP: 119/63 131/54 129/69 133/63  Pulse: 70 70 70 70  Temp:      TempSrc:      Resp:      Height:      Weight:      SpO2:        Intake/Output Summary (Last 24 hours) at 12/26/15 1111 Last data filed at 12/26/15 0700  Gross per 24 hour  Intake    360 ml  Output      0 ml  Net    360 ml   LABS: Basic Metabolic Panel:  Recent Labs  12/24/15 0611 12/26/15 0203  NA 143 142  K 3.9 4.2  CL 110 108  CO2 26 26  GLUCOSE 141* 174*  BUN 21* 21*  CREATININE 1.13* 1.02*  CALCIUM 9.0 9.6   CBC:  Recent Labs  12/24/15 0611 12/25/15 0339 12/26/15 0203  WBC 6.4 5.8 5.3  NEUTROABS 2.8  --   --   HGB 10.5* 10.4* 10.7*  HCT 33.5* 33.5* 33.1*  MCV 90.5 90.8 90.7  PLT 188 192 172   Cardiac Enzymes:  Recent Labs  12/24/15 1610 12/24/15 2151 12/25/15 0339  TROPONINI <0.03 <0.03 <0.03   BNP: Invalid input(s): POCBNP Hemoglobin A1C:  Recent Labs  12/25/15 0950  HGBA1C 7.3*     Radiology/Studies:  No acute abnormality noted  Physical Exam: Blood pressure 133/63, pulse 70, temperature 98 F (36.7 C), temperature source Oral, resp. rate 16, height '5\' 2"'$  (1.575 m), weight 134 lb 11.2 oz (61.1 kg), SpO2 96 %. Weight change:   Wt Readings from Last 3 Encounters:  12/25/15 134 lb 11.2 oz (61.1 kg)  03/23/10 147 lb (66.679 kg)    Clear anterior and posterior No pericardial friction rub is heard Neck veins are flat  ASSESSMENT:  1. Chest pain not felt to represent myocardial ischemia, and likely musculoskeletal. 2. Coronary angio with  nonobstructive coronary disease documented 3. Tachybradycardia syndrome with normally functioning pacemaker  Plan:   We need pacemaker interrogation prior to discharge.  No further cardiac evaluation for source of pain at this time. Consider cervical disc or musculoskeletal etiology of pain.  Demetrios Isaacs 12/26/2015, 11:11 AM

## 2015-12-27 ENCOUNTER — Encounter (HOSPITAL_COMMUNITY): Payer: Self-pay | Admitting: Cardiology

## 2016-01-06 ENCOUNTER — Ambulatory Visit (INDEPENDENT_AMBULATORY_CARE_PROVIDER_SITE_OTHER): Payer: Medicare Other | Admitting: Interventional Cardiology

## 2016-01-06 ENCOUNTER — Ambulatory Visit (INDEPENDENT_AMBULATORY_CARE_PROVIDER_SITE_OTHER): Payer: Medicare Other | Admitting: *Deleted

## 2016-01-06 ENCOUNTER — Encounter: Payer: Self-pay | Admitting: Interventional Cardiology

## 2016-01-06 ENCOUNTER — Encounter: Payer: Self-pay | Admitting: Cardiology

## 2016-01-06 VITALS — BP 136/66 | HR 70 | Ht 62.0 in | Wt 139.0 lb

## 2016-01-06 DIAGNOSIS — Z950 Presence of cardiac pacemaker: Secondary | ICD-10-CM | POA: Diagnosis not present

## 2016-01-06 DIAGNOSIS — I25119 Atherosclerotic heart disease of native coronary artery with unspecified angina pectoris: Secondary | ICD-10-CM

## 2016-01-06 DIAGNOSIS — I480 Paroxysmal atrial fibrillation: Secondary | ICD-10-CM

## 2016-01-06 DIAGNOSIS — I495 Sick sinus syndrome: Secondary | ICD-10-CM

## 2016-01-06 DIAGNOSIS — R079 Chest pain, unspecified: Secondary | ICD-10-CM | POA: Diagnosis not present

## 2016-01-06 DIAGNOSIS — J42 Unspecified chronic bronchitis: Secondary | ICD-10-CM

## 2016-01-06 NOTE — Patient Instructions (Addendum)
Medication Instructions:  Stop aspirin.  Labwork: None   Testing/Procedures: None    Follow-Up:  Your physician recommends that you schedule a follow-up appointment in: 3 months with Dr Curt Bears.   Your physician wants you to follow-up in: 6 months with Dr Tamala Julian. (October 2017).  You will receive a reminder letter in the mail two months in advance. If you don't receive a letter, please call our office to schedule the follow-up appointment.        If you need a refill on your cardiac medications before your next appointment, please call your pharmacy.

## 2016-01-06 NOTE — Progress Notes (Signed)
Cardiology Office Note   Date:  01/06/2016   ID:  [name], DOB 04/03/1935, MRN [id number]  PCP:  Merrilee Seashore, MD  Cardiologist:  [name], MD   Chief Complaint  Patient presents with  . Coronary Artery Disease      History of Present Illness: [name] is a 81 y.o. female who presents for CAD, previous coronary stenting including circumflex and right coronary arteries, hypertension, diabetes, COPD, permanent pacemaker for tachybradycardia syndrome, and chronic anticoagulation therapy.  Recently hospitalized with atypical chest pain. Coronary angiography demonstrated a moderate coronary disease but no high-grade obstruction to explain atypical discomfort.  She continues to smoke cigarettes.  Recently relocated here from [place]. She has DDD pacemaker and history of tachybradycardia syndrome. She is taking Xarelto and aspirin. Her Chads Vasc is 5. No bleeding complications.  Past Medical History  Diagnosis Date  . Diabetes mellitus without complication ([place])   . Hypertension   . COPD (chronic obstructive pulmonary disease) ([place])   . Pacemaker   . CAD (coronary artery disease), native coronary artery     PTCA of distal right coronary artery 1997 Cypher stents to circumflex 2005 Cardiac cath in 2011 with patent stent to circumflex and moderate disease elsewhere treated medically   . Hypothyroidism 03/23/2010  . Paroxysmal atrial fibrillation (HCC) 03/23/2010    CHA2DS2VASC score 5   . Cardiac pacemaker in situ 12/24/2015    Original implant reportedly in 1991 for tachybradycardia syndrome, generator change in 1999, 2004 and evidently again in 2016 in [place]     Past Surgical History  Procedure Laterality Date  . Pacemaker insertion  1991  . Hernia repair    . Cholecystectomy    . Pacemaker generator change  [id number], 2016  . Cardiac catheterization N/A 12/26/2015    Procedure: Left Heart Cath and Coronary Angiography;  Surgeon: Peter M [name],  MD;  Location: [facility];  Service: Cardiovascular;  Laterality: N/A;     Current Outpatient Prescriptions  Medication Sig Dispense Refill  . aspirin EC 81 MG tablet Take 81 mg by mouth daily.    Marland Kitchen atorvastatin (LIPITOR) 10 MG tablet Take 10 mg by mouth daily.    . carvedilol (COREG) 12.5 MG tablet Take 12.5 mg by mouth 2 (two) times daily with a meal.    . Choline Fenofibrate (FENOFIBRIC ACID) 45 MG CPDR Take 45 mg by mouth daily.    Marland Kitchen dronedarone (MULTAQ) 400 MG tablet Take 400 mg by mouth 2 (two) times daily with a meal.    . esomeprazole (NEXIUM) 40 MG capsule Take 40 mg by mouth daily at 12 noon.    Marland Kitchen glipiZIDE (GLUCOTROL) 10 MG tablet Take 10 mg by mouth daily before breakfast.    . Lancets (ONETOUCH ULTRASOFT) lancets Use as directed to test blood glucose daily.  3  . levothyroxine (SYNTHROID, LEVOTHROID) 125 MCG tablet Take 125 mcg by mouth daily before breakfast.    . linagliptin (TRADJENTA) 5 MG TABS tablet Take 5 mg by mouth daily.    Marland Kitchen losartan (COZAAR) 100 MG tablet Take 100 mg by mouth daily.    . metFORMIN (GLUCOPHAGE) 500 MG tablet Take 500 mg by mouth 2 (two) times daily with a meal.    . ONETOUCH VERIO test strip Use as directed to test blood glucose daily.  3  . rivaroxaban (XARELTO) 20 MG TABS tablet Take 20 mg by mouth daily with supper.     No current facility-administered medications for  this visit.    Allergies:   Review of patient's allergies indicates no known allergies.    Social History:  The patient  reports that she has been smoking.  She has never used smokeless tobacco. She reports that she does not drink alcohol or use illicit drugs.   Family History:  The patient's family history includes Cirrhosis in her brother; Heart disease in her father and mother.    ROS:  Please see the history of present illness.   Otherwise, review of systems are positive for Cough, skin beats, facial swelling, snoring, wheezing, nausea vomiting, difficulty with  bowels, headache, dizziness, diarrhea, abdominal pain, vision disturbance, and abdominal pain..   All other systems are reviewed and negative.    PHYSICAL EXAM: VS:  BP 136/66 mmHg  Pulse 70  Ht '5\' 2"'$  (1.575 m)  Wt 139 lb (63.05 kg)  BMI 25.42 kg/m2 , BMI Body mass index is 25.42 kg/(m^2). GEN: Well nourished, well developed, in no acute distress HEENT: normal Neck: no JVD, carotid bruits, or masses Cardiac: RRR.  There is no murmur, rub, or gallop. There is no edema. Respiratory:  clear to auscultation bilaterally, normal work of breathing. GI: soft, nontender, nondistended, + BS MS: no deformity or atrophy Skin: warm and dry, no rash Neuro:  Strength and sensation are intact Psych: euthymic mood, full affect   EKG:  EKG is not ordered today.    Recent Labs: 12/24/2015: ALT 14; B Natriuretic Peptide 57.2 12/26/2015: BUN 21*; Creatinine, Ser 1.02*; Hemoglobin 10.7*; Platelets 172; Potassium 4.2; Sodium 142    Lipid Panel    Component Value Date/Time   CHOL  03/13/2010 0505    190        ATP III CLASSIFICATION:  <200     mg/dL   Desirable  200-239  mg/dL   Borderline High  >=240    mg/dL   High          TRIG 160* 03/13/2010 0505   HDL 45 03/13/2010 0505   CHOLHDL 4.2 03/13/2010 0505   VLDL 32 03/13/2010 0505   LDLCALC * 03/13/2010 0505    113        Total Cholesterol/HDL:CHD Risk Coronary Heart Disease Risk Table                     Men   Women  1/2 Average Risk   3.4   3.3  Average Risk       5.0   4.4  2 X Average Risk   9.6   7.1  3 X Average Risk  23.4   11.0        Use the calculated Patient Ratio above and the CHD Risk Table to determine the patient's CHD Risk.        ATP III CLASSIFICATION (LDL):  <100     mg/dL   Optimal  100-129  mg/dL   Near or Above                    Optimal  130-159  mg/dL   Borderline  160-189  mg/dL   High  >190     mg/dL   Very High      Wt Readings from Last 3 Encounters:  01/06/16 139 lb (63.05 kg)  12/25/15 134 lb  11.2 oz (61.1 kg)  03/23/10 147 lb (66.679 kg)      Other studies Reviewed: Additional studies/ records that were reviewed today include: Digital images from the  recent catheterization are reviewed.. The findings include no new data other than as described below..  Diagnostic Diagram         LV function was normal with EF 60% and LVEDP 11 mmHg.  ASSESSMENT AND PLAN:  1. Chest pain, unspecified chest pain type Noncardiac with widely patent stents noted by recent cath  2. Coronary artery disease involving native coronary artery of native heart with angina pectoris ([place]) As above  3. Paroxysmal atrial fibrillation (HCC) History of PAF. On Xarelto therapy.  4. Cardiac pacemaker in situ Treatment for tachybradycardia syndrome.  5. Chronic bronchitis, unspecified chronic bronchitis type ([place]) Can continues to smoke cigarettes.    Current medicines are reviewed at length with the patient today.  The patient has the following concerns regarding medicines: She is on both aspirin and Xarelto. I discussed the increased bleeding risk..  The following changes/actions have been instituted:    Discontinue aspirin especially given the relatively mild nature of coronary obstructive disease.  Direct harsh recommendation to discontinue cigarette smoking.  Establish in the device clinic and bring her telemetry unit from home on that date.  Labs/ tests ordered today include:  No orders of the defined types were placed in this encounter.     Disposition:   FU with HS in 6 months  Signed, [name], MD  01/06/2016 9:46 AM    [facility] [address], [place], [place]  [address] Phone: [phone]; Fax: [phone]

## 2016-01-09 DIAGNOSIS — M79645 Pain in left finger(s): Secondary | ICD-10-CM | POA: Diagnosis not present

## 2016-01-09 LAB — CUP PACEART INCLINIC DEVICE CHECK
Battery Remaining Longevity: 72 mo
Date Time Interrogation Session: 20170410090453
Implantable Lead Implant Date: 20160713
Implantable Lead Location: 753859
Implantable Lead Model: 4136
Implantable Lead Serial Number: 29813363
Lead Channel Pacing Threshold Amplitude: 0.8 V
Lead Channel Pacing Threshold Pulse Width: 0.4 ms
Lead Channel Pacing Threshold Pulse Width: 0.5 ms
Lead Channel Setting Pacing Amplitude: 2 V
Lead Channel Setting Pacing Amplitude: 2.5 V
Lead Channel Setting Pacing Pulse Width: 0.5 ms
Lead Channel Setting Sensing Sensitivity: 2 mV
MDC IDC LEAD IMPLANT DT: 19990610
MDC IDC LEAD LOCATION: 753860
MDC IDC MSMT LEADCHNL RA IMPEDANCE VALUE: 362 Ohm
MDC IDC MSMT LEADCHNL RA PACING THRESHOLD AMPLITUDE: 1 V
MDC IDC MSMT LEADCHNL RV IMPEDANCE VALUE: 700 Ohm
MDC IDC MSMT LEADCHNL RV SENSING INTR AMPL: 9.9 mV
MDC IDC PG SERIAL: 393477
MDC IDC STAT BRADY RA PERCENT PACED: 100 %
MDC IDC STAT BRADY RV PERCENT PACED: 12 %

## 2016-01-09 NOTE — Progress Notes (Signed)
Pacemaker check in clinic w/Dr.Smith. Normal device function. Thresholds, sensing, impedances consistent with previous measurements. Device programmed to maximize longevity. No mode switch or high ventricular rates noted. Patient does have a h/o PAF + Xarelto. Device programmed at appropriate safety margins. RA output decreased from 3V to 2V. Histogram distribution appropriate for patient activity level. Device programmed to optimize intrinsic conduction. Estimated longevity 6 years. Patient will follow up with WC in 3 months.

## 2016-01-11 DIAGNOSIS — J069 Acute upper respiratory infection, unspecified: Secondary | ICD-10-CM | POA: Diagnosis not present

## 2016-02-14 DIAGNOSIS — I480 Paroxysmal atrial fibrillation: Secondary | ICD-10-CM | POA: Diagnosis not present

## 2016-02-29 DIAGNOSIS — E782 Mixed hyperlipidemia: Secondary | ICD-10-CM | POA: Diagnosis not present

## 2016-02-29 DIAGNOSIS — I10 Essential (primary) hypertension: Secondary | ICD-10-CM | POA: Diagnosis not present

## 2016-02-29 DIAGNOSIS — E1165 Type 2 diabetes mellitus with hyperglycemia: Secondary | ICD-10-CM | POA: Diagnosis not present

## 2016-04-17 ENCOUNTER — Encounter: Payer: Self-pay | Admitting: *Deleted

## 2016-04-17 DIAGNOSIS — E782 Mixed hyperlipidemia: Secondary | ICD-10-CM | POA: Diagnosis not present

## 2016-04-17 DIAGNOSIS — E1165 Type 2 diabetes mellitus with hyperglycemia: Secondary | ICD-10-CM | POA: Diagnosis not present

## 2016-04-24 DIAGNOSIS — E1165 Type 2 diabetes mellitus with hyperglycemia: Secondary | ICD-10-CM | POA: Diagnosis not present

## 2016-04-24 DIAGNOSIS — I10 Essential (primary) hypertension: Secondary | ICD-10-CM | POA: Diagnosis not present

## 2016-04-24 DIAGNOSIS — E782 Mixed hyperlipidemia: Secondary | ICD-10-CM | POA: Diagnosis not present

## 2016-04-27 ENCOUNTER — Emergency Department (HOSPITAL_COMMUNITY)
Admission: EM | Admit: 2016-04-27 | Discharge: 2016-04-27 | Disposition: A | Discharge status: Home / Self Care | Payer: Medicare Other | Attending: Emergency Medicine | Admitting: Emergency Medicine

## 2016-04-27 ENCOUNTER — Encounter (HOSPITAL_COMMUNITY): Payer: Self-pay | Admitting: Emergency Medicine

## 2016-04-27 ENCOUNTER — Telehealth: Payer: Self-pay | Admitting: Interventional Cardiology

## 2016-04-27 DIAGNOSIS — I2510 Atherosclerotic heart disease of native coronary artery without angina pectoris: Secondary | ICD-10-CM | POA: Diagnosis not present

## 2016-04-27 DIAGNOSIS — F17200 Nicotine dependence, unspecified, uncomplicated: Secondary | ICD-10-CM | POA: Diagnosis not present

## 2016-04-27 DIAGNOSIS — Z950 Presence of cardiac pacemaker: Secondary | ICD-10-CM | POA: Diagnosis not present

## 2016-04-27 DIAGNOSIS — R002 Palpitations: Secondary | ICD-10-CM | POA: Diagnosis not present

## 2016-04-27 DIAGNOSIS — I10 Essential (primary) hypertension: Secondary | ICD-10-CM | POA: Diagnosis not present

## 2016-04-27 DIAGNOSIS — E119 Type 2 diabetes mellitus without complications: Secondary | ICD-10-CM | POA: Diagnosis not present

## 2016-04-27 DIAGNOSIS — E039 Hypothyroidism, unspecified: Secondary | ICD-10-CM | POA: Insufficient documentation

## 2016-04-27 DIAGNOSIS — J449 Chronic obstructive pulmonary disease, unspecified: Secondary | ICD-10-CM | POA: Insufficient documentation

## 2016-04-27 LAB — BASIC METABOLIC PANEL
ANION GAP: 6 (ref 5–15)
BUN: 14 mg/dL (ref 6–20)
CHLORIDE: 107 mmol/L (ref 101–111)
CO2: 27 mmol/L (ref 22–32)
Calcium: 9.8 mg/dL (ref 8.9–10.3)
Creatinine, Ser: 0.87 mg/dL (ref 0.44–1.00)
Glucose, Bld: 62 mg/dL — ABNORMAL LOW (ref 65–99)
POTASSIUM: 3.9 mmol/L (ref 3.5–5.1)
SODIUM: 140 mmol/L (ref 135–145)

## 2016-04-27 LAB — MAGNESIUM: MAGNESIUM: 2 mg/dL (ref 1.7–2.4)

## 2016-04-27 LAB — I-STAT TROPONIN, ED: TROPONIN I, POC: 0 ng/mL (ref 0.00–0.08)

## 2016-04-27 LAB — TSH: TSH: 0.467 u[IU]/mL (ref 0.350–4.500)

## 2016-04-27 NOTE — Telephone Encounter (Signed)
New Message  Patient c/o Palpitations:  High priority if patient c/o lightheadedness and shortness of breath.  1. How long have you been having palpitations? Pt stated yesterday, today is worse   2. Are you currently experiencing lightheadedness and shortness of breath? no  3. Have you checked your BP and heart rate? (document readings) no   4. Are you experiencing any other symptoms? Dizziness

## 2016-04-27 NOTE — Discharge Instructions (Signed)
You were seen in the ED today with heart palpitations. We looked at your pacemaker and checked labs which were normal. Follow up with your PCP and Cardiologist in the coming week. Return to the emergency department with any chest pain, lightheadedness, almost fainting, or worsening palpitations.

## 2016-04-27 NOTE — ED Notes (Signed)
Pt comfortable with d/c and f/u instructions. Rx x0

## 2016-04-27 NOTE — ED Triage Notes (Addendum)
Pt from home with c/o her heart skipping a beat starting yesterday.  Reports she has a pacemaker.  No other symptoms reports.  Denies CP/ SOB/ N/V.  NAD, A&O.

## 2016-04-27 NOTE — ED Notes (Signed)
Per Joey at [employer], no abnormalities on interrogation of Pacemaker. Dr. Laverta Baltimore made aware.

## 2016-04-27 NOTE — Evaluation (Signed)
     Phone consultation with Dr. Laverta Baltimore regarding Ms. [name]. She presented to the ER for evaluation of brief palpitations as well as brief feeling of lightheadedness. No syncope or chest pain reported. She has a history of PAF and tachycardiac-bradycardia syndrome with [employer] pacemaker in place, CAD with previous history of RCA PTCA and DES to the circumflex. She follows with Dr. Tamala Julian and was seen in the office back in April at which time she was clinically stable. She is on Xarelto for stroke prophylaxis. Patient called the office today reporting her sense of palpitations. ECG in the ER showed an atrial paced rhythm, patient hemodynamically stable. Troponin I 0.0. Pacemaker interrogated in the ER with no reported arrhythmias and normal function by report. Dr. Laverta Baltimore plans for patient to be discharged home and asked that she be followed up in the office. Will forward this information to get a visit scheduled.  Satira Sark, M.D., F.A.C.C.

## 2016-04-27 NOTE — ED Provider Notes (Signed)
Emergency Department Provider Note   I have reviewed the triage vital signs and the nursing notes.   HISTORY  Chief Complaint Palpitations   HPI [name] is a 81 y.o. female with PMH of CAD, tachycardia with pacemaker [employer] investment [employer]), COPD, DM, HTN, hypothyroidism, and paroxysmal a-fib resents to the emergency department for evaluation of heart palpitations with episodic lightheadedness. Palpitation symptoms occur every 5-10 minutes which is unusual for the patient. She is currently on Carvedilol and Dronedarone in addition to her pacemaker. She denies changes in his medications recently. No fever, chills, vomiting, diarrhea. She's had no chest pain. No near syncope. She does feel lightheaded during some of the events but not all. She has been compliant with her thyroid medication.   Past Medical History:  Diagnosis Date  . CAD (coronary artery disease), native coronary artery    PTCA of distal right coronary artery 1997 Cypher stents to circumflex 2005 Cardiac cath in 2011 with patent stent to circumflex and moderate disease elsewhere treated medically   . Cardiac pacemaker in situ 12/24/2015   Original implant reportedly in 1991 for tachybradycardia syndrome, generator change in 1999, 2004 and evidently again in 2016 in [place]   . COPD (chronic obstructive pulmonary disease) ([place])   . Diabetes mellitus without complication ([place])   . Hypertension   . Hypothyroidism 03/23/2010  . Pacemaker   . Paroxysmal atrial fibrillation ([place]) 03/23/2010   CHA2DS2VASC score 5     Patient Active Problem List   Diagnosis Date Noted  . Unstable angina ([place]) 12/24/2015  . Cardiac pacemaker in situ 12/24/2015  . History of Graves' disease 12/24/2015  . Current use of Saim Almanza term anticoagulation 12/24/2015  . Diabetes mellitus without complication ([place])   . Hypothyroidism 03/23/2010  . Paroxysmal atrial fibrillation ([place]) 03/23/2010  . COPD (chronic obstructive pulmonary disease) ([place])  03/23/2010  . CAD (coronary artery disease), native coronary artery   . GERD     Past Surgical History:  Procedure Laterality Date  . CARDIAC CATHETERIZATION N/A 12/26/2015   Procedure: Left Heart Cath and Coronary Angiography;  Surgeon: Peter M [name], MD;  Location: [facility];  Service: Cardiovascular;  Laterality: N/A;  . CHOLECYSTECTOMY    . HERNIA REPAIR    . PACEMAKER GENERATOR CHANGE  [id number], 2016  . PACEMAKER INSERTION  1991    Current Outpatient Rx  . Order #: [id number] Class: Historical Med  . Order #: [id number] Class: Historical Med  . Order #: [id number] Class: Historical Med  . Order #: [id number] Class: Historical Med  . Order #: [id number] Class: Historical Med  . Order #: [id number] Class: Historical Med  . Order #: [id number] Class: Historical Med  . Order #: [id number] Class: Historical Med  . Order #: [id number] Class: Historical Med  . Order #: [id number] Class: Historical Med  . Order #: [id number] Class: Historical Med  . Order #: [id number] Class: Historical Med  . Order #: [id number] Class: Historical Med    Allergies Review of patient's allergies indicates no known allergies.  Family History  Problem Relation Age of Onset  . Heart disease Father   . Heart disease Mother   . Cirrhosis Brother     Social History Social History  Substance Use Topics  . Smoking status: Current Every Day Smoker    Packs/day: 0.50  . Smokeless tobacco: Never Used  . Alcohol use No    Review of Systems  Constitutional: No fever/chills Eyes: No visual changes. ENT: No sore throat. Cardiovascular: Denies chest pain. Positive palpitations and episodic lightheadedness  Respiratory:  Denies shortness of breath. Gastrointestinal: No abdominal pain.  No nausea, no vomiting.  No diarrhea.  No constipation. Genitourinary: Negative for dysuria. Musculoskeletal: Negative for back pain. Skin: Negative for rash. Neurological: Negative for headaches, focal weakness or numbness.  10-point  ROS otherwise negative.  ____________________________________________   PHYSICAL EXAM:  VITAL SIGNS: ED Triage Vitals  Enc Vitals Group     BP 04/27/16 1329 132/60     Pulse Rate 04/27/16 1329 70     Resp 04/27/16 1329 16     Temp 04/27/16 1329 98.4 F (36.9 C)     SpO2 04/27/16 1329 100 %     Pain Score 04/27/16 1323 5    Constitutional: Alert and oriented. Well appearing and in no acute distress. Eyes: Conjunctivae are normal. PERRL. EOMI. Head: Atraumatic. Nose: No congestion/rhinnorhea. Mouth/Throat: Mucous membranes are moist.  Oropharynx non-erythematous. Neck: No stridor.  Cardiovascular: Normal rate, regular rhythm. Good peripheral circulation. Grossly normal heart sounds.   Respiratory: Normal respiratory effort.  No retractions. Lungs CTAB. Gastrointestinal: Soft and nontender. No distention.  Musculoskeletal: No lower extremity tenderness nor edema. No gross deformities of extremities. Neurologic:  Normal speech and language. No gross focal neurologic deficits are appreciated.  Skin:  Skin is warm, dry and intact. No rash noted. Psychiatric: Mood and affect are normal. Speech and behavior are normal.  ____________________________________________   LABS (all labs ordered are listed, but only abnormal results are displayed)  Labs Reviewed  BASIC METABOLIC PANEL  MAGNESIUM  TSH  I-STAT TROPOININ, ED   ____________________________________________  EKG  Reviewed in MUSE.  ____________________________________________  RADIOLOGY  No results found.  ____________________________________________   PROCEDURES  Procedure(s) performed:   Procedures  None ____________________________________________   INITIAL IMPRESSION / ASSESSMENT AND PLAN / ED COURSE  Pertinent labs & imaging results that were available during my care of the patient were reviewed by me and considered in my medical decision making (see chart for details).  Patient resents to the  emergency department for evaluation of intermittent heart palpitations and occasional lightheadedness. Palpitation episodes are very brief current every 5-10 minutes. She is on multiple medications to control her arrhythmia along with a pacemaker. She does have hypothyroidism and takes Synthroid. Plan for evaluation of the patient's electrolytes, TSH, and interrogation the patient's pacemaker. Will discuss the case with cardiology.    06:50 PM Interrogation of the patient's pacemaker shows no sudden accelerations or other arrhythmia to suggest breakthrough A. fib or other dangerous rhythm. Labs are unremarkable. TSH pending but will follow up with PCP for review of this lab result. Discussed the case with cardiology who recommends follow-up with Dr. Tamala Julian in the next week. Discussed with patient and family at bedside who understand the plan and are pleased at discharge.   At this time, I do not feel there is any life-threatening condition present. I have reviewed and discussed all results (EKG, imaging, lab, urine as appropriate), exam findings with patient. I have reviewed nursing notes and appropriate previous records.  I feel the patient is safe to be discharged home without further emergent workup. Discussed usual and customary return precautions. Patient and family (if present) verbalize understanding and are comfortable with this plan.  Patient will follow-up with their primary care provider. If they do not have a primary care provider, information for follow-up has been provided to them. All questions have been answered.   ____________________________________________  FINAL CLINICAL IMPRESSION(S) / ED DIAGNOSES  Final diagnoses:  Palpitations     MEDICATIONS GIVEN  DURING THIS VISIT:  None  NEW OUTPATIENT MEDICATIONS STARTED DURING THIS VISIT:  None   Note:  This document was prepared using Dragon voice recognition software and may include unintentional dictation errors.  Nanda Quinton, MD Emergency Medicine   Margette Fast, MD 04/27/16 [phone]

## 2016-04-27 NOTE — Telephone Encounter (Signed)
Patient is currently being seen at [facility] ED.

## 2016-05-02 DIAGNOSIS — R002 Palpitations: Secondary | ICD-10-CM | POA: Diagnosis not present

## 2016-05-02 DIAGNOSIS — E782 Mixed hyperlipidemia: Secondary | ICD-10-CM | POA: Diagnosis not present

## 2016-05-02 DIAGNOSIS — E1165 Type 2 diabetes mellitus with hyperglycemia: Secondary | ICD-10-CM | POA: Diagnosis not present

## 2016-05-02 DIAGNOSIS — R1084 Generalized abdominal pain: Secondary | ICD-10-CM | POA: Diagnosis not present

## 2016-05-02 DIAGNOSIS — R109 Unspecified abdominal pain: Secondary | ICD-10-CM | POA: Diagnosis not present

## 2016-05-06 NOTE — Progress Notes (Signed)
Electrophysiology Office Note   Date:  05/07/2016   ID:  [name], DOB Apr 20, 1935, MRN [id number]  PCP:  Merrilee Seashore, MD  Cardiologist:  Tamala Julian Primary Electrophysiologist:  Will Meredith Leeds, MD    Chief Complaint  Patient presents with  . Pacemaker Check     History of Present Illness: [name] is a 81 y.o. female who presents today for electrophysiology evaluation.   Hx CAD, previous coronary stenting including circumflex and right coronary arteries, hypertension, diabetes, COPD, permanent pacemaker for tachybradycardia syndrome, and chronic anticoagulation therapy.   Today, she denies symptoms of palpitations, chest pain, shortness of breath, orthopnea, PND, lower extremity edema, claudication, dizziness, presyncope, syncope, bleeding, or neurologic sequela. The patient is tolerating medications without difficulties and is otherwise without complaint today. She was in emergency room with palpitations, but no acute cause was found. Since that time, she is felt well without any major complaints.   Past Medical History:  Diagnosis Date  . CAD (coronary artery disease), native coronary artery    PTCA of distal right coronary artery 1997 Cypher stents to circumflex 2005 Cardiac cath in 2011 with patent stent to circumflex and moderate disease elsewhere treated medically   . Cardiac pacemaker in situ 12/24/2015   Original implant reportedly in 1991 for tachybradycardia syndrome, generator change in 1999, 2004 and evidently again in 2016 in [place]   . COPD (chronic obstructive pulmonary disease) ([place])   . Diabetes mellitus without complication ([place])   . Hypertension   . Hypothyroidism 03/23/2010  . Pacemaker   . Paroxysmal atrial fibrillation ([place]) 03/23/2010   CHA2DS2VASC score 5    Past Surgical History:  Procedure Laterality Date  . CARDIAC CATHETERIZATION N/A 12/26/2015   Procedure: Left Heart Cath and Coronary Angiography;  Surgeon: Peter M [name], MD;   Location: [facility];  Service: Cardiovascular;  Laterality: N/A;  . CHOLECYSTECTOMY    . HERNIA REPAIR    . PACEMAKER GENERATOR CHANGE  [id number], 2016  . PACEMAKER INSERTION  1991     Current Outpatient Prescriptions  Medication Sig Dispense Refill  . atorvastatin (LIPITOR) 10 MG tablet Take 1 tablet (10 mg total) by mouth daily. 90 tablet 2  . carvedilol (COREG) 12.5 MG tablet Take 12.5 mg by mouth 2 (two) times daily with a meal.    . Choline Fenofibrate (FENOFIBRIC ACID) 45 MG CPDR Take 45 mg by mouth daily.    Marland Kitchen dronedarone (MULTAQ) 400 MG tablet Take 400 mg by mouth 2 (two) times daily with a meal.    . esomeprazole (NEXIUM) 40 MG capsule Take 40 mg by mouth daily at 12 noon.    Marland Kitchen glipiZIDE (GLUCOTROL) 10 MG tablet Take 10 mg by mouth daily before breakfast.    . Lancets (ONETOUCH ULTRASOFT) lancets Use as directed to test blood glucose daily.  3  . levothyroxine (SYNTHROID, LEVOTHROID) 125 MCG tablet Take 125 mcg by mouth daily before breakfast.    . linagliptin (TRADJENTA) 5 MG TABS tablet Take 5 mg by mouth daily.    Marland Kitchen losartan (COZAAR) 100 MG tablet Take 100 mg by mouth daily.    . metFORMIN (GLUCOPHAGE) 500 MG tablet Take 500 mg by mouth 2 (two) times daily with a meal.    . ONETOUCH VERIO test strip Use as directed to test blood glucose daily.  3  . rivaroxaban (XARELTO) 20 MG TABS tablet Take 20 mg by mouth daily with supper.     No current facility-administered medications for this visit.  Allergies:   Review of patient's allergies indicates no known allergies.   Social History:  The patient  reports that she has been smoking.  She has been smoking about 0.50 packs per day. She has never used smokeless tobacco. She reports that she does not drink alcohol or use drugs.   Family History:  The patient's family history includes Cirrhosis in her brother; Heart disease in her father and mother.    ROS:  Please see the history of present illness.   Otherwise,  review of systems is positive for Palpitations, abdominal pain, diarrhea, nausea vomiting, headaches.   All other systems are reviewed and negative.    PHYSICAL EXAM: VS:  BP 130/62   Pulse 70   Ht '5\' 2"'$  (1.575 m)   Wt 138 lb 3.2 oz (62.7 kg)   BMI 25.28 kg/m  , BMI Body mass index is 25.28 kg/m. GEN: Well nourished, well developed, in no acute distress  HEENT: normal  Neck: no JVD, carotid bruits, or masses Cardiac: RRR; no murmurs, rubs, or gallops,no edema  Respiratory:  clear to auscultation bilaterally, normal work of breathing GI: soft, nontender, nondistended, + BS MS: no deformity or atrophy  Skin: warm and dry,  device pocket is well healed Neuro:  Strength and sensation are intact Psych: euthymic mood, full affect  EKG:  EKG is not ordered today. Personal review of the ekg ordered 7/29shows A paced, rate 70, T wave flattening   Device interrogation is reviewed today in detail.  See PaceArt for details.   Recent Labs: 12/24/2015: ALT 14; B Natriuretic Peptide 57.2 12/26/2015: Hemoglobin 10.7; Platelets 172 04/27/2016: BUN 14; Creatinine, Ser 0.87; Magnesium 2.0; Potassium 3.9; Sodium 140; TSH 0.467    Lipid Panel     Component Value Date/Time   CHOL  03/13/2010 0505    190        ATP III CLASSIFICATION:  <200     mg/dL   Desirable  200-239  mg/dL   Borderline High  >=240    mg/dL   High          TRIG 160 (H) 03/13/2010 0505   HDL 45 03/13/2010 0505   CHOLHDL 4.2 03/13/2010 0505   VLDL 32 03/13/2010 0505   LDLCALC (H) 03/13/2010 0505    113        Total Cholesterol/HDL:CHD Risk Coronary Heart Disease Risk Table                     Men   Women  1/2 Average Risk   3.4   3.3  Average Risk       5.0   4.4  2 X Average Risk   9.6   7.1  3 X Average Risk  23.4   11.0        Use the calculated Patient Ratio above and the CHD Risk Table to determine the patient's CHD Risk.        ATP III CLASSIFICATION (LDL):  <100     mg/dL   Optimal  100-129  mg/dL    Near or Above                    Optimal  130-159  mg/dL   Borderline  160-189  mg/dL   High  >190     mg/dL   Very High     Wt Readings from Last 3 Encounters:  05/07/16 138 lb 3.2 oz (62.7 kg)  01/06/16 139 lb (63  kg)  12/25/15 134 lb 11.2 oz (61.1 kg)      Other studies Reviewed: Additional studies/ records that were reviewed today include:  TTE 12/25/15 - Left ventricle: The cavity size was normal. Systolic function was   normal. The estimated ejection fraction was in the range of 55%   to 60%. Wall motion was normal; there were no regional wall   motion abnormalities. The study is not technically sufficient to   allow evaluation of LV diastolic function. - Aortic valve: Transvalvular velocity was within the normal range.   There was no stenosis. There was mild to moderate regurgitation. - Mitral valve: There was no regurgitation. - Left atrium: The atrium was severely dilated. - Right ventricle: The cavity size was normal. Wall thickness was   normal. Systolic function was normal. - Right atrium: The atrium was severely dilated. - Tricuspid valve: There was mild regurgitation. - Pulmonary arteries: Systolic pressure was within the normal   range. PA peak pressure: 23 mm Hg (S).   Cardiac cath 12/26/15   Prox RCA to Dist RCA lesion, 30% stenosed.  Prox LAD lesion, 40% stenosed.  Mid LAD to Dist LAD lesion, 45% stenosed.  Ost Cx to Prox Cx lesion, 10% stenosed. The lesion was previously treated with a drug-eluting stent greater than two years ago. The left ventricular systolic function is normal.   ASSESSMENT AND PLAN:  1.  Tachy-Brady syndrome: s/p pacemkaer. She was having PMT on her pacemaker in her PVARP burst increased to prevent these episodes. It is possible that this was the cause of her palpitations. She is atrial dependent.   2. Paroxysmal atrial fibrillation: on Xarelto for anticoagulation   This patients CHA2DS2-VASc Score and unadjusted Ischemic  Stroke Rate (% per year) is equal to 9.7 % stroke rate/year from a score of 6  Above score calculated as 1 point each if present [CHF, HTN, DM, Vascular=MI/PAD/Aortic Plaque, Age if 65-74, or Female] Above score calculated as 2 points each if present [Age > 75, or Stroke/TIA/TE]  3. Hypertension: Well-controlled continue current management  4. Coronary artery disease: Currently no chest pain  Current medicines are reviewed at length with the patient today.   The patient does not have concerns regarding her medicines.  The following changes were made today:  none  Labs/ tests ordered today include:  No orders of the defined types were placed in this encounter.    Disposition:   FU with Will Camnitz 1 year  Signed, Will Meredith Leeds, MD  05/07/2016 11:40 AM     [facility] [address] [place] [address] [phone] (office) [phone] (fax) That helps she in with burning

## 2016-05-07 ENCOUNTER — Encounter: Payer: Self-pay | Admitting: Cardiology

## 2016-05-07 ENCOUNTER — Encounter (INDEPENDENT_AMBULATORY_CARE_PROVIDER_SITE_OTHER): Payer: Self-pay

## 2016-05-07 ENCOUNTER — Ambulatory Visit (INDEPENDENT_AMBULATORY_CARE_PROVIDER_SITE_OTHER): Payer: Medicare Other | Admitting: Cardiology

## 2016-05-07 VITALS — BP 130/62 | HR 70 | Ht 62.0 in | Wt 138.2 lb

## 2016-05-07 DIAGNOSIS — I480 Paroxysmal atrial fibrillation: Secondary | ICD-10-CM

## 2016-05-07 LAB — CUP PACEART INCLINIC DEVICE CHECK
Date Time Interrogation Session: 20170807040000
Implantable Lead Implant Date: 20160713
Implantable Lead Location: 753859
Implantable Lead Serial Number: 29813363
Lead Channel Pacing Threshold Amplitude: 0.6 V
Lead Channel Sensing Intrinsic Amplitude: 10.5 mV
Lead Channel Setting Pacing Amplitude: 1.3 V
Lead Channel Setting Pacing Pulse Width: 0.4 ms
Lead Channel Setting Sensing Sensitivity: 2 mV
MDC IDC LEAD IMPLANT DT: 19990610
MDC IDC LEAD LOCATION: 753860
MDC IDC MSMT LEADCHNL RA IMPEDANCE VALUE: 367 Ohm
MDC IDC MSMT LEADCHNL RA PACING THRESHOLD AMPLITUDE: 1 V
MDC IDC MSMT LEADCHNL RA PACING THRESHOLD PULSEWIDTH: 0.5 ms
MDC IDC MSMT LEADCHNL RV IMPEDANCE VALUE: 685 Ohm
MDC IDC MSMT LEADCHNL RV PACING THRESHOLD PULSEWIDTH: 0.5 ms
MDC IDC PG SERIAL: 393477
MDC IDC SET LEADCHNL RA PACING AMPLITUDE: 2 V

## 2016-05-07 MED ORDER — ATORVASTATIN CALCIUM 10 MG PO TABS: 10.0000 mg | ORAL_TABLET | Freq: Every day | ORAL | 2 refills | Status: DC

## 2016-05-07 NOTE — Patient Instructions (Addendum)
Medication Instructions:  Your physician recommends that you continue on your current medications as directed. Please refer to the Current Medication list given to you today.   Labwork: None Ordered   Testing/Procedures: None Ordered   Follow-Up: Remote monitoring is used to monitor your Pacemaker of ICD from home. This monitoring reduces the number of office visits required to check your device to one time per year. It allows [place] to keep an eye on the functioning of your device to ensure it is working properly. You are scheduled for a device check from home on Nov. 6, 2017. You may send your transmission at any time that day. If you have a wireless device, the transmission will be sent automatically. After your physician reviews your transmission, you will receive a postcard with your next transmission date.    Your physician wants you to follow-up in: 1 year with Dr. Curt Bears.  You will receive a reminder letter in the mail two months in advance. If you don't receive a letter, please call our office to schedule the follow-up appointment.   If you need a refill on your cardiac medications before your next appointment, please call your pharmacy.   Thank you for choosing [facility]! Christen Bame, RN [phone]

## 2016-06-06 DIAGNOSIS — K219 Gastro-esophageal reflux disease without esophagitis: Secondary | ICD-10-CM | POA: Diagnosis not present

## 2016-06-06 DIAGNOSIS — R05 Cough: Secondary | ICD-10-CM | POA: Diagnosis not present

## 2016-08-06 ENCOUNTER — Ambulatory Visit (INDEPENDENT_AMBULATORY_CARE_PROVIDER_SITE_OTHER): Payer: Medicare Other | Admitting: *Deleted

## 2016-08-06 DIAGNOSIS — I495 Sick sinus syndrome: Secondary | ICD-10-CM

## 2016-08-06 NOTE — Progress Notes (Signed)
Remote pacemaker transmission.   

## 2016-08-08 ENCOUNTER — Encounter: Payer: Self-pay | Admitting: Cardiology

## 2016-08-20 LAB — CUP PACEART REMOTE DEVICE CHECK
Battery Remaining Longevity: 66 mo
Date Time Interrogation Session: 20171106071100
Implantable Lead Location: 753860
Implantable Pulse Generator Implant Date: 20150130
Lead Channel Pacing Threshold Amplitude: 0.8 V
Lead Channel Pacing Threshold Amplitude: 1 V
Lead Channel Pacing Threshold Pulse Width: 0.5 ms
Lead Channel Setting Pacing Amplitude: 2 V
MDC IDC LEAD IMPLANT DT: 19990610
MDC IDC LEAD IMPLANT DT: 20160713
MDC IDC LEAD LOCATION: 753859
MDC IDC LEAD SERIAL: 29813363
MDC IDC MSMT BATTERY REMAINING PERCENTAGE: 100 %
MDC IDC MSMT LEADCHNL RA IMPEDANCE VALUE: 361 Ohm
MDC IDC MSMT LEADCHNL RV IMPEDANCE VALUE: 647 Ohm
MDC IDC MSMT LEADCHNL RV PACING THRESHOLD PULSEWIDTH: 0.4 ms
MDC IDC SET LEADCHNL RV PACING AMPLITUDE: 1.2 V
MDC IDC SET LEADCHNL RV PACING PULSEWIDTH: 0.4 ms
MDC IDC SET LEADCHNL RV SENSING SENSITIVITY: 2 mV
MDC IDC STAT BRADY RA PERCENT PACED: 100 %
MDC IDC STAT BRADY RV PERCENT PACED: 3 %
Pulse Gen Serial Number: 393477

## 2016-09-04 DIAGNOSIS — Z23 Encounter for immunization: Secondary | ICD-10-CM | POA: Diagnosis not present

## 2016-09-04 DIAGNOSIS — I10 Essential (primary) hypertension: Secondary | ICD-10-CM | POA: Diagnosis not present

## 2016-09-04 DIAGNOSIS — E1165 Type 2 diabetes mellitus with hyperglycemia: Secondary | ICD-10-CM | POA: Diagnosis not present

## 2016-09-04 DIAGNOSIS — E782 Mixed hyperlipidemia: Secondary | ICD-10-CM | POA: Diagnosis not present

## 2016-10-26 DIAGNOSIS — R413 Other amnesia: Secondary | ICD-10-CM | POA: Diagnosis not present

## 2016-10-26 DIAGNOSIS — R197 Diarrhea, unspecified: Secondary | ICD-10-CM | POA: Diagnosis not present

## 2016-10-29 DIAGNOSIS — R197 Diarrhea, unspecified: Secondary | ICD-10-CM | POA: Diagnosis not present

## 2016-11-05 ENCOUNTER — Ambulatory Visit (INDEPENDENT_AMBULATORY_CARE_PROVIDER_SITE_OTHER): Payer: Medicare Other | Admitting: *Deleted

## 2016-11-05 ENCOUNTER — Encounter (INDEPENDENT_AMBULATORY_CARE_PROVIDER_SITE_OTHER): Payer: Self-pay

## 2016-11-05 DIAGNOSIS — I495 Sick sinus syndrome: Secondary | ICD-10-CM | POA: Diagnosis not present

## 2016-11-05 NOTE — Progress Notes (Signed)
Remote pacemaker transmission.   

## 2016-11-06 LAB — CUP PACEART REMOTE DEVICE CHECK
Battery Remaining Longevity: 60 mo
Battery Remaining Percentage: 96 %
Implantable Lead Location: 753859
Implantable Lead Location: 753860
Implantable Lead Serial Number: 29813363
Implantable Pulse Generator Implant Date: 20150130
Lead Channel Pacing Threshold Amplitude: 0.8 V
Lead Channel Pacing Threshold Pulse Width: 0.4 ms
Lead Channel Pacing Threshold Pulse Width: 0.5 ms
Lead Channel Setting Pacing Amplitude: 2 V
Lead Channel Setting Pacing Pulse Width: 0.4 ms
Lead Channel Setting Sensing Sensitivity: 2 mV
MDC IDC LEAD IMPLANT DT: 19990610
MDC IDC LEAD IMPLANT DT: 20160713
MDC IDC MSMT LEADCHNL RA IMPEDANCE VALUE: 342 Ohm
MDC IDC MSMT LEADCHNL RA PACING THRESHOLD AMPLITUDE: 1 V
MDC IDC MSMT LEADCHNL RV IMPEDANCE VALUE: 612 Ohm
MDC IDC SESS DTM: 20180205071200
MDC IDC SET LEADCHNL RV PACING AMPLITUDE: 1.3 V
MDC IDC STAT BRADY RA PERCENT PACED: 100 %
MDC IDC STAT BRADY RV PERCENT PACED: 4 %
Pulse Gen Serial Number: 393477

## 2016-11-07 ENCOUNTER — Encounter: Payer: Self-pay | Admitting: Cardiology

## 2016-11-09 DIAGNOSIS — L729 Follicular cyst of the skin and subcutaneous tissue, unspecified: Secondary | ICD-10-CM | POA: Diagnosis not present

## 2016-11-09 DIAGNOSIS — R197 Diarrhea, unspecified: Secondary | ICD-10-CM | POA: Diagnosis not present

## 2016-11-09 DIAGNOSIS — E1165 Type 2 diabetes mellitus with hyperglycemia: Secondary | ICD-10-CM | POA: Diagnosis not present

## 2016-11-09 DIAGNOSIS — E782 Mixed hyperlipidemia: Secondary | ICD-10-CM | POA: Diagnosis not present

## 2016-11-12 ENCOUNTER — Other Ambulatory Visit: Payer: Self-pay | Admitting: Dermatology

## 2016-11-12 DIAGNOSIS — L089 Local infection of the skin and subcutaneous tissue, unspecified: Secondary | ICD-10-CM | POA: Diagnosis not present

## 2016-11-12 DIAGNOSIS — L0291 Cutaneous abscess, unspecified: Secondary | ICD-10-CM | POA: Diagnosis not present

## 2016-11-12 DIAGNOSIS — L720 Epidermal cyst: Secondary | ICD-10-CM | POA: Diagnosis not present

## 2016-11-13 ENCOUNTER — Encounter (HOSPITAL_COMMUNITY): Payer: Self-pay | Admitting: Emergency Medicine

## 2016-11-13 ENCOUNTER — Emergency Department (HOSPITAL_COMMUNITY)
Admission: EM | Admit: 2016-11-13 | Discharge: 2016-11-13 | Disposition: A | Discharge status: Home / Self Care | Payer: Medicare Other | Attending: Emergency Medicine | Admitting: Emergency Medicine

## 2016-11-13 DIAGNOSIS — Z7901 Long term (current) use of anticoagulants: Secondary | ICD-10-CM | POA: Diagnosis not present

## 2016-11-13 DIAGNOSIS — R609 Edema, unspecified: Secondary | ICD-10-CM

## 2016-11-13 DIAGNOSIS — I2510 Atherosclerotic heart disease of native coronary artery without angina pectoris: Secondary | ICD-10-CM | POA: Insufficient documentation

## 2016-11-13 DIAGNOSIS — Z7984 Long term (current) use of oral hypoglycemic drugs: Secondary | ICD-10-CM | POA: Insufficient documentation

## 2016-11-13 DIAGNOSIS — E039 Hypothyroidism, unspecified: Secondary | ICD-10-CM | POA: Insufficient documentation

## 2016-11-13 DIAGNOSIS — I10 Essential (primary) hypertension: Secondary | ICD-10-CM | POA: Diagnosis not present

## 2016-11-13 DIAGNOSIS — Z950 Presence of cardiac pacemaker: Secondary | ICD-10-CM | POA: Diagnosis not present

## 2016-11-13 DIAGNOSIS — R220 Localized swelling, mass and lump, head: Secondary | ICD-10-CM | POA: Insufficient documentation

## 2016-11-13 DIAGNOSIS — F17200 Nicotine dependence, unspecified, uncomplicated: Secondary | ICD-10-CM | POA: Insufficient documentation

## 2016-11-13 DIAGNOSIS — J449 Chronic obstructive pulmonary disease, unspecified: Secondary | ICD-10-CM | POA: Insufficient documentation

## 2016-11-13 NOTE — ED Notes (Addendum)
Pt had a cyst removed yesterday from her head by dermatologist. Per family pt was swollen from the site down the forehead and to the eyebrows earlier. Called primary care physician who stated she needed to be evaluated.  Swelling has mainly resolved at this time. Family states it has continued to go down throughout the day.

## 2016-11-13 NOTE — Discharge Instructions (Signed)
As discussed, your evaluation today has been largely reassuring.  But, it is important that you monitor your condition carefully, and do not hesitate to return to the ED if you develop new, or concerning changes in your condition. ? ?Otherwise, please follow-up with your physician for appropriate ongoing care. ? ?

## 2016-11-13 NOTE — ED Provider Notes (Signed)
[facility] DEPT Provider Note   CSN: [id number] Arrival date & time: 11/13/16  1810     History   Chief Complaint Chief Complaint  Patient presents with  . Cyst    HPI [name] is a 82 y.o. female.  HPI  Patient presents with concern of swelling around a recent surgical site. Patient is here with her daughter who assists with the history of present illness. Yesterday the patient had elective removal of a cyst on the left lateral scalp. Initially the patient was well, but overnight she developed substantial swelling, discomfort about the area. After discussion with her physician she was sent here for evaluation. She denies other new changes including fever, nausea, vomiting, confusion, vision changes. No additional medication for relief. Patient states that she takes all of her medication as directed including antibiotic, twice daily since the procedure.   Past Medical History:  Diagnosis Date  . CAD (coronary artery disease), native coronary artery    PTCA of distal right coronary artery 1997 Cypher stents to circumflex 2005 Cardiac cath in 2011 with patent stent to circumflex and moderate disease elsewhere treated medically   . Cardiac pacemaker in situ 12/24/2015   Original implant reportedly in 1991 for tachybradycardia syndrome, generator change in 1999, 2004 and evidently again in 2016 in [place]   . COPD (chronic obstructive pulmonary disease) ([place])   . Diabetes mellitus without complication ([place])   . Hypertension   . Hypothyroidism 03/23/2010  . Pacemaker   . Paroxysmal atrial fibrillation ([place]) 03/23/2010   CHA2DS2VASC score 5     Patient Active Problem List   Diagnosis Date Noted  . Unstable angina ([place]) 12/24/2015  . Cardiac pacemaker in situ 12/24/2015  . History of Graves' disease 12/24/2015  . Current use of long term anticoagulation 12/24/2015  . Diabetes mellitus without complication ([place])   . Hypothyroidism 03/23/2010  . Paroxysmal atrial  fibrillation ([place]) 03/23/2010  . COPD (chronic obstructive pulmonary disease) ([place]) 03/23/2010  . CAD (coronary artery disease), native coronary artery   . GERD     Past Surgical History:  Procedure Laterality Date  . CARDIAC CATHETERIZATION N/A 12/26/2015   Procedure: Left Heart Cath and Coronary Angiography;  Surgeon: Peter M [name], MD;  Location: [facility];  Service: Cardiovascular;  Laterality: N/A;  . CHOLECYSTECTOMY    . HERNIA REPAIR    . PACEMAKER GENERATOR CHANGE  [id number], 2016  . PACEMAKER INSERTION  1991    OB History    No data available       Home Medications    Prior to Admission medications   Medication Sig Start Date End Date Taking? Authorizing Provider  atorvastatin (LIPITOR) 10 MG tablet Take 1 tablet (10 mg total) by mouth daily. 05/07/16   Will Meredith Leeds, MD  carvedilol (COREG) 12.5 MG tablet Take 12.5 mg by mouth 2 (two) times daily with a meal.    Historical Provider, MD  Choline Fenofibrate (FENOFIBRIC ACID) 45 MG CPDR Take 45 mg by mouth daily.    Historical Provider, MD  dronedarone (MULTAQ) 400 MG tablet Take 400 mg by mouth 2 (two) times daily with a meal.    Historical Provider, MD  esomeprazole (NEXIUM) 40 MG capsule Take 40 mg by mouth daily at 12 noon.    Historical Provider, MD  glipiZIDE (GLUCOTROL) 10 MG tablet Take 10 mg by mouth daily before breakfast.    Historical Provider, MD  Lancets (ONETOUCH ULTRASOFT) lancets Use as directed to test blood glucose  daily. 12/27/15   Historical Provider, MD  levothyroxine (SYNTHROID, LEVOTHROID) 125 MCG tablet Take 125 mcg by mouth daily before breakfast.    Historical Provider, MD  linagliptin (TRADJENTA) 5 MG TABS tablet Take 5 mg by mouth daily.    Historical Provider, MD  losartan (COZAAR) 100 MG tablet Take 100 mg by mouth daily.    Historical Provider, MD  metFORMIN (GLUCOPHAGE) 500 MG tablet Take 500 mg by mouth 2 (two) times daily with a meal.    Historical Provider, MD  [facility] VERIO  test strip Use as directed to test blood glucose daily. 12/27/15   Historical Provider, MD  rivaroxaban (XARELTO) 20 MG TABS tablet Take 20 mg by mouth daily with supper.    Historical Provider, MD    Family History Family History  Problem Relation Age of Onset  . Heart disease Father   . Heart disease Mother   . Cirrhosis Brother     Social History Social History  Substance Use Topics  . Smoking status: Current Every Day Smoker    Packs/day: 0.50  . Smokeless tobacco: Never Used  . Alcohol use No     Allergies   Patient has no known allergies.   Review of Systems Review of Systems  Constitutional:       Per HPI, otherwise negative  HENT:       Per HPI, otherwise negative  Respiratory:       Per HPI, otherwise negative  Cardiovascular:       Per HPI, otherwise negative  Gastrointestinal: Negative for vomiting.  Endocrine:       Negative aside from HPI  Genitourinary:       Neg aside from HPI   Musculoskeletal:       Per HPI, otherwise negative  Skin: Positive for wound.  Allergic/Immunologic: Negative for immunocompromised state.  Neurological: Negative for syncope.     Physical Exam Updated Vital Signs BP 151/59   Pulse 72   Temp 98.5 F (36.9 C) (Oral)   Resp 18   SpO2 99%   Physical Exam  Constitutional: She is oriented to person, place, and time. She appears well-developed and well-nourished. No distress.  HENT:  Head:    Slightly left of midline there is a 1 cm surgical area minimal surrounding swelling, no purulent discharge, no bleeding, minimal tenderness to palpation in the area  Eyes: Conjunctivae and EOM are normal.  Cardiovascular: Normal rate and regular rhythm.   Pulmonary/Chest: Effort normal and breath sounds normal. No stridor. No respiratory distress.  Abdominal: She exhibits no distension.  Musculoskeletal: She exhibits no edema.  Neurological: She is alert and oriented to person, place, and time. She displays no atrophy and no  tremor. No cranial nerve deficit. She exhibits normal muscle tone. Gait normal.  Skin: Skin is warm and dry.  Psychiatric: She has a normal mood and affect.  Nursing note and vitals reviewed.  ED Treatments / Results   Procedures Procedures (including critical care time)  Initial Impression / Assessment and Plan / ED Course  I have reviewed the triage vital signs and the nursing notes.  Pertinent labs & imaging results that were available during my care of the patient were reviewed by me and considered in my medical decision making (see chart for details).  Generally well-appearing elderly female presents one day after elective surgical procedure, now with concern for swelling. No evidence for local infection, bacteremia, sepsis given her reassuring vitals, absence of distress, reassuring physical exam. Patient encouraged  to continue taking antibiotics, use warm compresses, topical antibiotic, follow-up with primary care.  Final Clinical Impressions(s) / ED Diagnoses   Final diagnoses:  Swelling     Carmin Muskrat, MD 11/13/16 2209

## 2016-11-13 NOTE — ED Triage Notes (Signed)
Pt sts had cyst removed from head yesterday and then had some swelling today; no distress noted

## 2016-11-22 DIAGNOSIS — R1084 Generalized abdominal pain: Secondary | ICD-10-CM | POA: Diagnosis not present

## 2016-11-22 DIAGNOSIS — R197 Diarrhea, unspecified: Secondary | ICD-10-CM | POA: Diagnosis not present

## 2016-11-27 ENCOUNTER — Other Ambulatory Visit: Payer: Self-pay | Admitting: Internal Medicine

## 2016-11-27 DIAGNOSIS — R1084 Generalized abdominal pain: Secondary | ICD-10-CM

## 2016-11-27 DIAGNOSIS — R197 Diarrhea, unspecified: Secondary | ICD-10-CM

## 2016-11-28 ENCOUNTER — Encounter: Payer: Self-pay | Admitting: Nurse Practitioner

## 2016-11-28 ENCOUNTER — Ambulatory Visit
Admission: RE | Admit: 2016-11-28 | Discharge: 2016-11-28 | Disposition: A | Discharge status: Home / Self Care | Payer: Medicare Other | Source: Ambulatory Visit | Attending: Internal Medicine | Admitting: Internal Medicine

## 2016-11-28 DIAGNOSIS — R197 Diarrhea, unspecified: Secondary | ICD-10-CM

## 2016-11-28 DIAGNOSIS — R1084 Generalized abdominal pain: Secondary | ICD-10-CM

## 2016-11-28 MED ORDER — IOPAMIDOL (ISOVUE-300) INJECTION 61%
100.0000 mL | Freq: Once | INTRAVENOUS | Status: AC | PRN
  Administered 2016-11-28: 100 mL via INTRAVENOUS

## 2016-11-30 ENCOUNTER — Encounter: Payer: Self-pay | Admitting: Gastroenterology

## 2016-12-03 ENCOUNTER — Encounter: Payer: Self-pay | Admitting: Nurse Practitioner

## 2016-12-03 ENCOUNTER — Ambulatory Visit (INDEPENDENT_AMBULATORY_CARE_PROVIDER_SITE_OTHER): Payer: Medicare Other | Admitting: Nurse Practitioner

## 2016-12-03 VITALS — BP 124/50 | HR 72 | Ht 61.0 in | Wt 139.0 lb

## 2016-12-03 DIAGNOSIS — R197 Diarrhea, unspecified: Secondary | ICD-10-CM

## 2016-12-03 DIAGNOSIS — R110 Nausea: Secondary | ICD-10-CM | POA: Diagnosis not present

## 2016-12-03 DIAGNOSIS — R1084 Generalized abdominal pain: Secondary | ICD-10-CM | POA: Diagnosis not present

## 2016-12-03 NOTE — Patient Instructions (Signed)
   Glad your better.    Call us back if you need [place].     I appreciate the opportunity to care for you.

## 2016-12-03 NOTE — Progress Notes (Signed)
HPI: Patient is a 82 year old female, referred by PCP Dr. Merrilee Seashore for diarrhea and abdominal pain. She has a history of coronary artery disease, status post stenting, diabetes, COPD, permanent pacemaker. She is on chronic anticoagulation therapy. Patient is accompanied by her daughter who helps with the history. In November patient developed nausea and loose stool, mostly postprandial and up to 4 times a day. No nocturnal urges to defecate but she was frequently incontinent during the night. Sometime in January she began getting mid abdominal pain prior to defecation . She's had no fevers. No blood in stool. She took Pepto-Bismol for couple of weeks with some benefit. Patient had not made any dietary nor medication changes. She drinks lactose free milk.  She saw PCP, metformin which she been on for 3-4 years was decreased to once a day which apparently did not improve the diarrhea. Stool pathogen panel, which included C. difficile toxin A/B was negative. I do not have patient's lab work but according to the daughter everything was normal. Per daughter patient told after an abd xray that there "was a blockage".  CT scan of the abdomen and pelvis late Feb was unrevealing. Reviewed records from PCP, plain films in Aug 2017 were unrevealing as well.   For unclear reasons patient has not had any diarrhea for the last 3 days. Her bowel movements are now solid.  No further abdominal pain or nausea. Patient cannot recall anything that she has done differently over the last few days. Not taking any anti-diarrheals. Never had a colonoscopy. No [place] of colon cancer.   Past Medical History:  Diagnosis Date  . CAD (coronary artery disease), native coronary artery    PTCA of distal right coronary artery 1997 Cypher stents to circumflex 2005 Cardiac cath in 2011 with patent stent to circumflex and moderate disease elsewhere treated medically   . Cardiac pacemaker in situ 12/24/2015   Original implant  reportedly in 1991 for tachybradycardia syndrome, generator change in 1999, 2004 and evidently again in 2016 in [place]   . COPD (chronic obstructive pulmonary disease) ([place])   . Diabetes mellitus without complication ([place])   . Hypertension   . Hypothyroidism 03/23/2010  . Pacemaker   . Paroxysmal atrial fibrillation ([place]) 03/23/2010   CHA2DS2VASC score 5     Past Surgical History:  Procedure Laterality Date  . CARDIAC CATHETERIZATION N/A 12/26/2015   Procedure: Left Heart Cath and Coronary Angiography;  Surgeon: Peter M [name], MD;  Location: [facility];  Service: Cardiovascular;  Laterality: N/A;  . CARDIOVERSION  05/2015  . CHOLECYSTECTOMY  2012  . CORONARY ANGIOPLASTY WITH STENT PLACEMENT  1990  . HERNIA REPAIR    . PACEMAKER GENERATOR CHANGE  [id number], 2016  . PACEMAKER INSERTION  1991   Family History  Problem Relation Age of Onset  . Heart disease Father   . Heart disease Mother   . Cirrhosis Brother   . Colon cancer Neg Hx   . Stomach cancer Neg Hx    Social History  Substance Use Topics  . Smoking status: Current Every Day Smoker    Packs/day: 0.50  . Smokeless tobacco: Never Used  . Alcohol use No   Current Outpatient Prescriptions  Medication Sig Dispense Refill  . atorvastatin (LIPITOR) 10 MG tablet Take 1 tablet (10 mg total) by mouth daily. 90 tablet 2  . carvedilol (COREG) 12.5 MG tablet Take 12.5 mg by mouth 2 (two) times daily with a meal.    .  Choline Fenofibrate (FENOFIBRIC ACID) 45 MG CPDR Take 45 mg by mouth daily.    Marland Kitchen dronedarone (MULTAQ) 400 MG tablet Take 400 mg by mouth 2 (two) times daily with a meal.    . esomeprazole (NEXIUM) 40 MG capsule Take 40 mg by mouth daily at 12 noon.    Marland Kitchen glipiZIDE (GLUCOTROL) 10 MG tablet Take 10 mg by mouth daily before breakfast.    . Lancets (ONETOUCH ULTRASOFT) lancets Use as directed to test blood glucose daily.  3  . levothyroxine (SYNTHROID, LEVOTHROID) 125 MCG tablet Take 125 mcg by mouth daily  before breakfast.    . linagliptin (TRADJENTA) 5 MG TABS tablet Take 5 mg by mouth daily.    Marland Kitchen losartan (COZAAR) 100 MG tablet Take 100 mg by mouth daily.    . metFORMIN (GLUCOPHAGE) 500 MG tablet Take 500 mg by mouth 2 (two) times daily with a meal.    . ONETOUCH VERIO test strip Use as directed to test blood glucose daily.  3  . rivaroxaban (XARELTO) 20 MG TABS tablet Take 20 mg by mouth daily with supper.     No current facility-administered medications for this visit.    No Known Allergies   Review of Systems: Positive for allergy, sinus trouble, vision changes, cough and urine leakage. Positive for allergy, sinus problem, vision changes, cough and urine leakage. All other systems reviewed and negative except where noted in HPI.    Physical Exam: BP (!) 124/50   Pulse 72   Ht '5\' 1"'$  (1.549 m)   Wt 139 lb (63 kg)   BMI 26.26 kg/m  Constitutional:  Well-developed, black female in no acute distress. Psychiatric: pleasant, normal mood and affect. Behavior is normal. EENT: pupils round, conjunctivae are normal. No scleral icterus. Neck supple.  Cardiovascular: Normal rate, regular rhythm.  Pulmonary/chest: Effort normal and breath sounds normal. No wheezing, rales or rhonchi. Abdominal: Soft, nondistended, nontender. Bowel sounds active throughout. There are no masses palpable. No hepatomegaly. Extremities: no edema Lymphadenopathy: No cervical adenopathy noted. Neurological: Alert and oriented to person place and time. Skin: Skin is warm and dry. No rashes noted.   ASSESSMENT AND PLAN:  Very pleasant, relatively healthy appearing 34-year female with nausea, crampy diarrhea since Nov / Dec which has affected quality of life. Stool pathogen panel including C. difficile toxin A/B is negative. Abd/pelvic CT scan unrevealing. For unclear reasons stools have normalized over last few days. She looks and feels well. Abdominal exam is unremarkable. She will certainly call us back for  recurrent or new symptoms such as weight loss or blood in stool. Patient understands that colon cancer screenings aren't generally started this late in life but a diagnostic colonoscopy could become necessary depending on clinical course. Of course that would need to be weighed against risks of holding Xarelto for the procedure.    Tye Savoy, NP  12/03/2016, 2:31 PM  Cc:  Merrilee Seashore, MD

## 2016-12-04 NOTE — Progress Notes (Signed)
Reviewed and agree with initial management plan.  Azarias Chiou T. Sharee Sturdy, MD FACG 

## 2017-02-04 ENCOUNTER — Ambulatory Visit (INDEPENDENT_AMBULATORY_CARE_PROVIDER_SITE_OTHER): Payer: Medicare Other | Admitting: *Deleted

## 2017-02-04 DIAGNOSIS — I495 Sick sinus syndrome: Secondary | ICD-10-CM | POA: Diagnosis not present

## 2017-02-04 NOTE — Progress Notes (Signed)
Remote pacemaker transmission.   

## 2017-02-05 LAB — CUP PACEART REMOTE DEVICE CHECK
Battery Remaining Percentage: 92 %
Brady Statistic RV Percent Paced: 4 %
Date Time Interrogation Session: 20180507061100
Implantable Lead Implant Date: 19990610
Implantable Lead Location: 753859
Implantable Lead Location: 753860
Implantable Pulse Generator Implant Date: 20150130
Lead Channel Impedance Value: 343 Ohm
Lead Channel Pacing Threshold Amplitude: 0.8 V
Lead Channel Pacing Threshold Amplitude: 1 V
Lead Channel Pacing Threshold Pulse Width: 0.5 ms
Lead Channel Setting Pacing Pulse Width: 0.4 ms
MDC IDC LEAD IMPLANT DT: 20160713
MDC IDC LEAD SERIAL: 29813363
MDC IDC MSMT BATTERY REMAINING LONGEVITY: 60 mo
MDC IDC MSMT LEADCHNL RV IMPEDANCE VALUE: 621 Ohm
MDC IDC MSMT LEADCHNL RV PACING THRESHOLD PULSEWIDTH: 0.4 ms
MDC IDC SET LEADCHNL RA PACING AMPLITUDE: 2 V
MDC IDC SET LEADCHNL RV PACING AMPLITUDE: 1.2 V
MDC IDC SET LEADCHNL RV SENSING SENSITIVITY: 2 mV
MDC IDC STAT BRADY RA PERCENT PACED: 100 %
Pulse Gen Serial Number: 393477

## 2017-02-06 DIAGNOSIS — E1165 Type 2 diabetes mellitus with hyperglycemia: Secondary | ICD-10-CM | POA: Diagnosis not present

## 2017-02-06 DIAGNOSIS — E782 Mixed hyperlipidemia: Secondary | ICD-10-CM | POA: Diagnosis not present

## 2017-02-06 DIAGNOSIS — Z0000 Encounter for general adult medical examination without abnormal findings: Secondary | ICD-10-CM | POA: Diagnosis not present

## 2017-02-06 DIAGNOSIS — N390 Urinary tract infection, site not specified: Secondary | ICD-10-CM | POA: Diagnosis not present

## 2017-02-06 DIAGNOSIS — Z23 Encounter for immunization: Secondary | ICD-10-CM | POA: Diagnosis not present

## 2017-02-06 DIAGNOSIS — I10 Essential (primary) hypertension: Secondary | ICD-10-CM | POA: Diagnosis not present

## 2017-02-08 ENCOUNTER — Encounter: Payer: Self-pay | Admitting: Cardiology

## 2017-02-08 NOTE — Progress Notes (Signed)
Letter  

## 2017-03-09 ENCOUNTER — Observation Stay (HOSPITAL_COMMUNITY)
Admission: EM | Admit: 2017-03-09 | Discharge: 2017-03-11 | Disposition: A | Discharge status: Home / Self Care | Payer: Medicare Other | Attending: Internal Medicine | Admitting: Internal Medicine

## 2017-03-09 ENCOUNTER — Encounter (HOSPITAL_COMMUNITY): Payer: Self-pay

## 2017-03-09 ENCOUNTER — Emergency Department (HOSPITAL_COMMUNITY): Payer: Medicare Other

## 2017-03-09 DIAGNOSIS — E039 Hypothyroidism, unspecified: Secondary | ICD-10-CM | POA: Insufficient documentation

## 2017-03-09 DIAGNOSIS — R7989 Other specified abnormal findings of blood chemistry: Secondary | ICD-10-CM | POA: Diagnosis not present

## 2017-03-09 DIAGNOSIS — Z79899 Other long term (current) drug therapy: Secondary | ICD-10-CM | POA: Insufficient documentation

## 2017-03-09 DIAGNOSIS — J449 Chronic obstructive pulmonary disease, unspecified: Secondary | ICD-10-CM | POA: Diagnosis not present

## 2017-03-09 DIAGNOSIS — I088 Other rheumatic multiple valve diseases: Secondary | ICD-10-CM | POA: Diagnosis not present

## 2017-03-09 DIAGNOSIS — D649 Anemia, unspecified: Secondary | ICD-10-CM | POA: Diagnosis not present

## 2017-03-09 DIAGNOSIS — I700 Atherosclerosis of aorta: Secondary | ICD-10-CM | POA: Diagnosis not present

## 2017-03-09 DIAGNOSIS — K219 Gastro-esophageal reflux disease without esophagitis: Secondary | ICD-10-CM | POA: Insufficient documentation

## 2017-03-09 DIAGNOSIS — I2510 Atherosclerotic heart disease of native coronary artery without angina pectoris: Secondary | ICD-10-CM | POA: Insufficient documentation

## 2017-03-09 DIAGNOSIS — R61 Generalized hyperhidrosis: Secondary | ICD-10-CM | POA: Diagnosis not present

## 2017-03-09 DIAGNOSIS — R079 Chest pain, unspecified: Secondary | ICD-10-CM | POA: Diagnosis not present

## 2017-03-09 DIAGNOSIS — F17210 Nicotine dependence, cigarettes, uncomplicated: Secondary | ICD-10-CM | POA: Insufficient documentation

## 2017-03-09 DIAGNOSIS — E119 Type 2 diabetes mellitus without complications: Secondary | ICD-10-CM | POA: Diagnosis not present

## 2017-03-09 DIAGNOSIS — E785 Hyperlipidemia, unspecified: Secondary | ICD-10-CM | POA: Diagnosis not present

## 2017-03-09 DIAGNOSIS — I480 Paroxysmal atrial fibrillation: Secondary | ICD-10-CM | POA: Diagnosis present

## 2017-03-09 DIAGNOSIS — Z7984 Long term (current) use of oral hypoglycemic drugs: Secondary | ICD-10-CM | POA: Insufficient documentation

## 2017-03-09 DIAGNOSIS — Z950 Presence of cardiac pacemaker: Secondary | ICD-10-CM | POA: Diagnosis not present

## 2017-03-09 DIAGNOSIS — Z8249 Family history of ischemic heart disease and other diseases of the circulatory system: Secondary | ICD-10-CM | POA: Diagnosis not present

## 2017-03-09 DIAGNOSIS — Z7901 Long term (current) use of anticoagulants: Secondary | ICD-10-CM | POA: Insufficient documentation

## 2017-03-09 DIAGNOSIS — M546 Pain in thoracic spine: Secondary | ICD-10-CM | POA: Diagnosis not present

## 2017-03-09 DIAGNOSIS — Z955 Presence of coronary angioplasty implant and graft: Secondary | ICD-10-CM | POA: Insufficient documentation

## 2017-03-09 DIAGNOSIS — E1169 Type 2 diabetes mellitus with other specified complication: Secondary | ICD-10-CM

## 2017-03-09 DIAGNOSIS — I10 Essential (primary) hypertension: Secondary | ICD-10-CM | POA: Diagnosis not present

## 2017-03-09 LAB — CBC WITH DIFFERENTIAL/PLATELET
BASOS ABS: 0 10*3/uL (ref 0.0–0.1)
Basophils Relative: 1 %
EOS PCT: 1 %
Eosinophils Absolute: 0.1 10*3/uL (ref 0.0–0.7)
HCT: 37.1 % (ref 36.0–46.0)
Hemoglobin: 11.9 g/dL — ABNORMAL LOW (ref 12.0–15.0)
LYMPHS PCT: 31 %
Lymphs Abs: 1.8 10*3/uL (ref 0.7–4.0)
MCH: 27.7 pg (ref 26.0–34.0)
MCHC: 32.1 g/dL (ref 30.0–36.0)
MCV: 86.5 fL (ref 78.0–100.0)
MONO ABS: 0.7 10*3/uL (ref 0.1–1.0)
MONOS PCT: 12 %
Neutro Abs: 3.4 10*3/uL (ref 1.7–7.7)
Neutrophils Relative %: 55 %
Platelets: 158 10*3/uL (ref 150–400)
RBC: 4.29 MIL/uL (ref 3.87–5.11)
RDW: 14.5 % (ref 11.5–15.5)
WBC: 6 10*3/uL (ref 4.0–10.5)

## 2017-03-09 NOTE — ED Provider Notes (Signed)
[facility] DEPT Provider Note   CSN: [id number] Arrival date & time: 03/09/17  2213  By signing my name below, I, Levester Fresh, attest that this documentation has been prepared under the direction and in the presence of Sherwood Gambler, MD. Electronically Signed: Levester Fresh, Scribe. 03/10/2017. 2:36 AM.   History   Chief Complaint Chief Complaint  Patient presents with  . Back Pain   HPI Comments [name] is a 82 y.o. female with a PMHx significant for CAD, tachycardia with pacemaker Corporate investment banker), COPD, DM, HTN, hypothyroidism, and paroxysmal a-fib, who presents to the Emergency Department with complaints of an episode of sudden onset nausea, diaphoresis and back pain, described as pressure,  x2 hours ago.  This resolved after about 15 minute with no intervention.   Pt was walking while cooking dinner at onset.  Per EMS, pt was diaphoretic en route.  They administered ASA PTA.  She denies experiencing any chest pain, shortness of breath, abdominal pain or lightheadedness.  Pt with cough at baseline 2/2 COPD.  The history is provided by the patient, the EMS personnel, a relative and medical records.   Past Medical History:  Diagnosis Date  . CAD (coronary artery disease), native coronary artery    PTCA of distal right coronary artery 1997 Cypher stents to circumflex 2005 Cardiac cath in 2011 with patent stent to circumflex and moderate disease elsewhere treated medically   . Cardiac pacemaker in situ 12/24/2015   Original implant reportedly in 1991 for tachybradycardia syndrome, generator change in 1999, 2004 and evidently again in 2016 in [place]   . COPD (chronic obstructive pulmonary disease) ([place])   . Diabetes mellitus without complication ([place])   . Hypertension   . Hypothyroidism 03/23/2010  . Pacemaker   . Paroxysmal atrial fibrillation ([place]) 03/23/2010   CHA2DS2VASC score 5     Patient Active Problem List   Diagnosis Date Noted  . Chest pain 03/10/2017   . Unstable angina ([place]) 12/24/2015  . Cardiac pacemaker in situ 12/24/2015  . History of Graves' disease 12/24/2015  . Current use of long term anticoagulation 12/24/2015  . Diabetes mellitus without complication ([place])   . Hypothyroidism 03/23/2010  . Paroxysmal atrial fibrillation ([place]) 03/23/2010  . COPD (chronic obstructive pulmonary disease) ([place]) 03/23/2010  . CAD (coronary artery disease), native coronary artery   . GERD     Past Surgical History:  Procedure Laterality Date  . CARDIAC CATHETERIZATION N/A 12/26/2015   Procedure: Left Heart Cath and Coronary Angiography;  Surgeon: Peter M [name], MD;  Location: [facility];  Service: Cardiovascular;  Laterality: N/A;  . CARDIOVERSION  05/2015  . CHOLECYSTECTOMY  2012  . CORONARY ANGIOPLASTY WITH STENT PLACEMENT  1990  . HERNIA REPAIR    . PACEMAKER GENERATOR CHANGE  [id number], 2016  . PACEMAKER INSERTION  1991    OB History    No data available       Home Medications    Prior to Admission medications   Medication Sig Start Date End Date Taking? Authorizing Provider  atorvastatin (LIPITOR) 10 MG tablet Take 1 tablet (10 mg total) by mouth daily. 05/07/16  Yes Camnitz, Will Hassell Done, MD  carvedilol (COREG) 12.5 MG tablet Take 12.5 mg by mouth 2 (two) times daily with a meal.   Yes [provider]  Choline Fenofibrate (FENOFIBRIC ACID) 45 MG CPDR Take 45 mg by mouth daily.   Yes [provider]  dronedarone (MULTAQ) 400 MG tablet Take 400 mg by  mouth 2 (two) times daily with a meal.   Yes [provider]  esomeprazole (NEXIUM) 40 MG capsule Take 40 mg by mouth daily at 12 noon.   Yes [provider]  glipiZIDE (GLUCOTROL) 10 MG tablet Take 10 mg by mouth daily before breakfast.   Yes [provider]  levothyroxine (SYNTHROID, LEVOTHROID) 125 MCG tablet Take 125 mcg by mouth daily before breakfast.   Yes [provider]  linagliptin (TRADJENTA) 5 MG TABS tablet Take 5  mg by mouth daily.   Yes [provider]  losartan (COZAAR) 100 MG tablet Take 100 mg by mouth daily.   Yes [provider]  metFORMIN (GLUCOPHAGE) 500 MG tablet Take 500 mg by mouth every evening.    Yes [provider]  rivaroxaban (XARELTO) 20 MG TABS tablet Take 20 mg by mouth daily with supper.   Yes [provider]    Family History Family History  Problem Relation Age of Onset  . Heart disease Father   . Heart disease Mother   . Cirrhosis Brother   . Colon cancer Neg Hx   . Stomach cancer Neg Hx     Social History Social History  Substance Use Topics  . Smoking status: Current Every Day Smoker    Packs/day: 0.50  . Smokeless tobacco: Never Used  . Alcohol use No     Allergies   Patient has no known allergies.  Review of Systems Review of Systems  Constitutional: Positive for diaphoresis.  Respiratory: Positive for cough. Negative for shortness of breath.   Cardiovascular: Negative for chest pain.  Gastrointestinal: Negative for abdominal pain. Nausea: Resolved.  Musculoskeletal: Positive for back pain.  Skin: Negative for wound.  Neurological: Negative for light-headedness.  Hematological: Bruises/bleeds easily: Baseline.  All other systems reviewed and are negative.  Physical Exam Updated Vital Signs BP (!) 148/49 (BP Location: Left Arm)   Pulse 70   Temp 98.4 F (36.9 C) (Oral)   Resp 18   Ht 5\' 2"  (1.575 m)   Wt 62.1 kg (137 lb)   LMP  (LMP Unknown)   SpO2 97%   BMI 25.06 kg/m   Physical Exam  Constitutional: She is oriented to person, place, and time. She appears well-developed and well-nourished.  HENT:  Head: Normocephalic and atraumatic.  Right Ear: External ear normal.  Left Ear: External ear normal.  Nose: Nose normal.  Eyes: Right eye exhibits no discharge. Left eye exhibits no discharge.  Cardiovascular: Normal rate, regular rhythm and normal heart sounds.   Pulmonary/Chest: Effort normal and breath  sounds normal.  Abdominal: Soft. There is no tenderness.  Musculoskeletal:  No thoracic or lumbar tenderness.  2+ DP pulses bilaterally.  Neurological: She is alert and oriented to person, place, and time.  Skin: Skin is warm and dry.  Nursing note and vitals reviewed.  ED Treatments / Results  DIAGNOSTIC STUDIES Oxygen Saturation is 100% on room air, normal by my interpretation.    COORDINATION OF CARE: 11:06 PM Discussed treatment plan with pt and family at bedside and they agreed to plan.  12:48 AM Pt resting comfortably in bed.  Updated her on labs that have resulted thus far.  Labs (all labs ordered are listed, but only abnormal results are displayed) Labs Reviewed  BASIC METABOLIC PANEL - Abnormal; Notable for the following:       Result Value   CO2 19 (*)    GFR calc non Af Amer 54 (*)    All  other components within normal limits  TROPONIN I - Abnormal; Notable for the following:    Troponin I 0.03 (*)    All other components within normal limits  CBC WITH DIFFERENTIAL/PLATELET - Abnormal; Notable for the following:    Hemoglobin 11.9 (*)    All other components within normal limits  TSH - Abnormal; Notable for the following:    TSH 0.053 (*)    All other components within normal limits  CBC WITH DIFFERENTIAL/PLATELET - Abnormal; Notable for the following:    Hemoglobin 10.8 (*)    HCT 34.1 (*)    All other components within normal limits  BASIC METABOLIC PANEL - Abnormal; Notable for the following:    Glucose, Bld 239 (*)    GFR calc non Af Amer 55 (*)    All other components within normal limits  GLUCOSE, CAPILLARY - Abnormal; Notable for the following:    Glucose-Capillary 175 (*)    All other components within normal limits  TROPONIN I  TROPONIN I  LIPID PANEL  TROPONIN I    EKG  EKG Interpretation  Date/Time:  Saturday March 09 2017 22:18:22 EDT Ventricular Rate:  70 PR Interval:    QRS Duration: 97 QT Interval:  418 QTC Calculation: 451 R  Axis:   76 Text Interpretation:  Accelerated junctional rhythm Nonspecific T abnormalities, anterior leads no significant change since July 2017 Confirmed by Sherwood Gambler [phone]) on 03/09/2017 11:13:13 PM       Radiology Dg Chest 2 View  Result Date: 03/09/2017 CLINICAL DATA:  Transient back pain and diaphoresis. EXAM: CHEST  2 VIEW COMPARISON:  Radiograph 06/06/2016 FINDINGS: Multi lead right-sided pacemaker in place, leads unchanged in position. Probable external monitoring device projecting over the thoracic inlet centrally. Unchanged heart size and mediastinal contours with atherosclerosis of the aortic arch. Stable hyperinflation and chronic interstitial coarsening. Lingular scarring is unchanged. No consolidation, pleural fluid or pneumothorax. No acute osseous abnormality is seen. IMPRESSION: 1. No evidence of acute abnormality. 2. Stable in chronic lung disease hyperinflation and interstitial coarsening consistent with COPD. 3. Right-sided pacemaker in place.  Thoracic aortic atherosclerosis. Electronically Signed   By: Jeb Levering M.D.   On: 03/09/2017 23:47    Procedures Procedures (including critical care time)  Medications Ordered in ED Medications  pantoprazole (PROTONIX) EC tablet 40 mg (not administered)  losartan (COZAAR) tablet 100 mg (not administered)  rivaroxaban (XARELTO) tablet 20 mg (not administered)  fenofibrate tablet 54 mg (not administered)  atorvastatin (LIPITOR) tablet 10 mg (not administered)  acetaminophen (TYLENOL) tablet 650 mg (not administered)  ondansetron (ZOFRAN) injection 4 mg (not administered)  morphine 4 MG/ML injection 2 mg (not administered)  gi cocktail (Maalox,Lidocaine,Donnatal) (not administered)  carvedilol (COREG) tablet 12.5 mg (12.5 mg Oral Given 03/10/17 0417)  dronedarone (MULTAQ) tablet 400 mg (400 mg Oral Given 03/10/17 0417)  nicotine (NICODERM CQ - dosed in mg/24 hours) patch 21 mg (21 mg Transdermal Patch Applied 03/10/17 0416)   levothyroxine (SYNTHROID, LEVOTHROID) tablet 125 mcg (125 mcg Oral Given 03/10/17 0653)  insulin aspart (novoLOG) injection 0-9 Units (2 Units Subcutaneous Given 03/10/17 0754)  aspirin chewable tablet 324 mg (324 mg Oral Given 03/10/17 0056)     Initial Impression / Assessment and Plan / ED Course  I have reviewed the triage vital signs and the nursing notes.  Pertinent labs & imaging results that were available during my care of the patient were reviewed by me and considered in my medical decision making (see chart for  details).     Patient's troponin is borderline at 0.03. Previous cath with some disease, though at the time non-obstructive and with a stent. This is how her prior ACS was diagnosed with back pain. Thus, will observe for ACS r/o.  Final Clinical Impressions(s) / ED Diagnoses   Final diagnoses:  Acute thoracic back pain, unspecified back pain laterality   I personally performed the services described in this documentation, which was scribed in my presence. The recorded information has been reviewed and is accurate.   New Prescriptions Current Discharge Medication List       Sherwood Gambler, MD 03/10/17 [phone]

## 2017-03-09 NOTE — ED Triage Notes (Signed)
Pt here for mid back pain described as pressure, rating 2/10, diaphoretic on ems arrival, pain resolved 15 mins pta by ems, pt reports 1 week of nausea with no vomiting, paced rhythm, given asa by ems.

## 2017-03-10 ENCOUNTER — Encounter (HOSPITAL_COMMUNITY): Payer: Self-pay | Admitting: *Deleted

## 2017-03-10 ENCOUNTER — Observation Stay (HOSPITAL_BASED_OUTPATIENT_CLINIC_OR_DEPARTMENT_OTHER): Payer: Medicare Other

## 2017-03-10 DIAGNOSIS — Z950 Presence of cardiac pacemaker: Secondary | ICD-10-CM | POA: Diagnosis not present

## 2017-03-10 DIAGNOSIS — I480 Paroxysmal atrial fibrillation: Secondary | ICD-10-CM

## 2017-03-10 DIAGNOSIS — E1169 Type 2 diabetes mellitus with other specified complication: Secondary | ICD-10-CM | POA: Diagnosis not present

## 2017-03-10 DIAGNOSIS — E785 Hyperlipidemia, unspecified: Secondary | ICD-10-CM

## 2017-03-10 DIAGNOSIS — R079 Chest pain, unspecified: Secondary | ICD-10-CM

## 2017-03-10 DIAGNOSIS — I10 Essential (primary) hypertension: Secondary | ICD-10-CM

## 2017-03-10 DIAGNOSIS — E038 Other specified hypothyroidism: Secondary | ICD-10-CM | POA: Diagnosis not present

## 2017-03-10 DIAGNOSIS — R7989 Other specified abnormal findings of blood chemistry: Secondary | ICD-10-CM

## 2017-03-10 DIAGNOSIS — R748 Abnormal levels of other serum enzymes: Secondary | ICD-10-CM | POA: Diagnosis not present

## 2017-03-10 DIAGNOSIS — E162 Hypoglycemia, unspecified: Secondary | ICD-10-CM | POA: Diagnosis not present

## 2017-03-10 DIAGNOSIS — E11649 Type 2 diabetes mellitus with hypoglycemia without coma: Secondary | ICD-10-CM | POA: Diagnosis not present

## 2017-03-10 DIAGNOSIS — E039 Hypothyroidism, unspecified: Secondary | ICD-10-CM | POA: Diagnosis not present

## 2017-03-10 DIAGNOSIS — M546 Pain in thoracic spine: Secondary | ICD-10-CM

## 2017-03-10 DIAGNOSIS — Z7901 Long term (current) use of anticoagulants: Secondary | ICD-10-CM | POA: Diagnosis not present

## 2017-03-10 LAB — BASIC METABOLIC PANEL
Anion gap: 10 (ref 5–15)
Anion gap: 8 (ref 5–15)
BUN: 19 mg/dL (ref 6–20)
BUN: 18 mg/dL (ref 6–20)
CALCIUM: 9.3 mg/dL (ref 8.9–10.3)
CHLORIDE: 106 mmol/L (ref 101–111)
CHLORIDE: 110 mmol/L (ref 101–111)
CO2: 19 mmol/L — ABNORMAL LOW (ref 22–32)
CO2: 24 mmol/L (ref 22–32)
CREATININE: 0.95 mg/dL (ref 0.44–1.00)
CREATININE: 0.97 mg/dL (ref 0.44–1.00)
Calcium: 9.4 mg/dL (ref 8.9–10.3)
GFR calc Af Amer: >60 mL/min (ref >60)
GFR calc non Af Amer: 54 mL/min — ABNORMAL LOW (ref >60)
GFR, EST NON AFRICAN AMERICAN: 55 mL/min — AB (ref >60)
Glucose, Bld: 239 mg/dL — ABNORMAL HIGH (ref 65–99)
Glucose, Bld: 65 mg/dL (ref 65–99)
Potassium: 3.8 mmol/L (ref 3.5–5.1)
Potassium: 4.7 mmol/L (ref 3.5–5.1)
SODIUM: 138 mmol/L (ref 135–145)
SODIUM: 139 mmol/L (ref 135–145)

## 2017-03-10 LAB — ECHOCARDIOGRAM COMPLETE
CHL CUP RV SYS PRESS: 24 mmHg
CHL CUP TV REG PEAK VELOCITY: 229 cm/s
E/e' ratio: 6.79
EWDT: 243 ms
FS: 32 % (ref 28–44)
HEIGHTINCHES: 62 in
IVS/LV PW RATIO, ED: 1.04
LA diam end sys: 43 mm
LA diam index: 2.59 cm/m2
LA vol A4C: 92.7 ml
LASIZE: 43 mm
LAVOL: 88.8 mL
LAVOLIN: 53.4 mL/m2
LDCA: 2.54 cm2
LV E/e' medial: 6.79
LV E/e'average: 6.79
LV TDI E'MEDIAL: 4.88
LV e' LATERAL: 8.4 cm/s
LV sys vol: 28 mL (ref 14–42)
LVDIAVOL: 69 mL (ref 46–106)
LVDIAVOLIN: 41 mL/m2
LVOT SV: 57 mL
LVOT VTI: 22.5 cm
LVOT diameter: 18 mm
LVOTPV: 105 cm/s
LVSYSVOLIN: 17 mL/m2
Lateral S' vel: 7.28 cm/s
MV Dec: 243
MV pk A vel: 54.6 m/s
MV pk E vel: 57 m/s
P 1/2 time: 370 ms
PW: 11.7 mm — AB (ref 0.6–1.1)
RV TAPSE: 15 mm
Simpson's disk: 59
Stroke v: 41 ml
TDI e' lateral: 8.4
TR max vel: 229 cm/s
WEIGHTICAEL: 2192 [oz_av]

## 2017-03-10 LAB — CBC WITH DIFFERENTIAL/PLATELET
BASOS PCT: 0 %
Basophils Absolute: 0 10*3/uL (ref 0.0–0.1)
EOS ABS: 0.1 10*3/uL (ref 0.0–0.7)
Eosinophils Relative: 2 %
HCT: 34.1 % — ABNORMAL LOW (ref 36.0–46.0)
HEMOGLOBIN: 10.8 g/dL — AB (ref 12.0–15.0)
Lymphocytes Relative: 42 %
Lymphs Abs: 2.5 10*3/uL (ref 0.7–4.0)
MCH: 27.6 pg (ref 26.0–34.0)
MCHC: 31.7 g/dL (ref 30.0–36.0)
MCV: 87 fL (ref 78.0–100.0)
MONOS PCT: 8 %
Monocytes Absolute: 0.5 10*3/uL (ref 0.1–1.0)
NEUTROS PCT: 48 %
Neutro Abs: 2.9 10*3/uL (ref 1.7–7.7)
Platelets: 175 10*3/uL (ref 150–400)
RBC: 3.92 MIL/uL (ref 3.87–5.11)
RDW: 14.4 % (ref 11.5–15.5)
WBC: 6 10*3/uL (ref 4.0–10.5)

## 2017-03-10 LAB — TROPONIN I
TROPONIN I: 0.03 ng/mL — AB (ref <0.03)
Troponin I: <0.03 ng/mL (ref <0.03)
Troponin I: <0.03 ng/mL (ref <0.03)

## 2017-03-10 LAB — GLUCOSE, CAPILLARY
GLUCOSE-CAPILLARY: 175 mg/dL — AB (ref 65–99)
GLUCOSE-CAPILLARY: 68 mg/dL (ref 65–99)
Glucose-Capillary: 127 mg/dL — ABNORMAL HIGH (ref 65–99)
Glucose-Capillary: 126 mg/dL — ABNORMAL HIGH (ref 65–99)

## 2017-03-10 LAB — LIPID PANEL
CHOL/HDL RATIO: 2.4 ratio
CHOLESTEROL: 132 mg/dL (ref 0–200)
HDL: 56 mg/dL (ref >40)
LDL Cholesterol: 64 mg/dL (ref 0–99)
Triglycerides: 61 mg/dL (ref <150)
VLDL: 12 mg/dL (ref 0–40)

## 2017-03-10 LAB — TSH: TSH: 0.053 u[IU]/mL — ABNORMAL LOW (ref 0.350–4.500)

## 2017-03-10 MED ORDER — ASPIRIN 81 MG PO CHEW
324.0000 mg | CHEWABLE_TABLET | Freq: Once | ORAL | Status: AC
  Administered 2017-03-10: 324 mg via ORAL
  Filled 2017-03-10: qty 4

## 2017-03-10 MED ORDER — FENOFIBRATE 54 MG PO TABS
54.0000 mg | ORAL_TABLET | Freq: Every day | ORAL | Status: DC
  Administered 2017-03-10 – 2017-03-11 (×2): 54 mg via ORAL
  Filled 2017-03-10 (×2): qty 1

## 2017-03-10 MED ORDER — DRONEDARONE HCL 400 MG PO TABS
400.0000 mg | ORAL_TABLET | Freq: Two times a day (BID) | ORAL | Status: DC
  Administered 2017-03-10 – 2017-03-11 (×3): 400 mg via ORAL
  Filled 2017-03-10 (×5): qty 1

## 2017-03-10 MED ORDER — LEVOTHYROXINE SODIUM 25 MCG PO TABS
125.0000 ug | ORAL_TABLET | Freq: Every day | ORAL | Status: DC
  Administered 2017-03-10 – 2017-03-11 (×2): 125 ug via ORAL
  Filled 2017-03-10 (×2): qty 1

## 2017-03-10 MED ORDER — ONDANSETRON HCL 4 MG/2ML IJ SOLN: 4.0000 mg | Freq: Four times a day (QID) | INTRAMUSCULAR | Status: DC | PRN

## 2017-03-10 MED ORDER — GI COCKTAIL ~~LOC~~: 30.0000 mL | Freq: Four times a day (QID) | ORAL | Status: DC | PRN

## 2017-03-10 MED ORDER — LOSARTAN POTASSIUM 50 MG PO TABS
100.0000 mg | ORAL_TABLET | Freq: Every day | ORAL | Status: DC
  Administered 2017-03-10 – 2017-03-11 (×2): 100 mg via ORAL
  Filled 2017-03-10 (×2): qty 2

## 2017-03-10 MED ORDER — INSULIN ASPART 100 UNIT/ML ~~LOC~~ SOLN
0.0000 [IU] | Freq: Three times a day (TID) | SUBCUTANEOUS | Status: DC
  Administered 2017-03-10: 1 [IU] via SUBCUTANEOUS
  Administered 2017-03-10 – 2017-03-11 (×2): 2 [IU] via SUBCUTANEOUS

## 2017-03-10 MED ORDER — NICOTINE 21 MG/24HR TD PT24
21.0000 mg | MEDICATED_PATCH | Freq: Every day | TRANSDERMAL | Status: DC
Start: 2017-03-10 — End: 2017-03-11
  Administered 2017-03-10 – 2017-03-11 (×2): 21 mg via TRANSDERMAL
  Filled 2017-03-10 (×3): qty 1

## 2017-03-10 MED ORDER — LEVOTHYROXINE SODIUM 125 MCG PO TABS: 125.0000 ug | ORAL_TABLET | Freq: Every day | ORAL | Status: DC

## 2017-03-10 MED ORDER — RIVAROXABAN 20 MG PO TABS
20.0000 mg | ORAL_TABLET | Freq: Every day | ORAL | Status: DC
  Administered 2017-03-10: 20 mg via ORAL
  Filled 2017-03-10: qty 1

## 2017-03-10 MED ORDER — MORPHINE SULFATE (PF) 4 MG/ML IV SOLN: 2.0000 mg | INTRAVENOUS | Status: DC | PRN

## 2017-03-10 MED ORDER — CARVEDILOL 12.5 MG PO TABS
12.5000 mg | ORAL_TABLET | Freq: Two times a day (BID) | ORAL | Status: DC
  Administered 2017-03-10 – 2017-03-11 (×3): 12.5 mg via ORAL
  Filled 2017-03-10 (×3): qty 1

## 2017-03-10 MED ORDER — CARVEDILOL 12.5 MG PO TABS: 12.5000 mg | ORAL_TABLET | Freq: Two times a day (BID) | ORAL | Status: DC

## 2017-03-10 MED ORDER — PANTOPRAZOLE SODIUM 40 MG PO TBEC
40.0000 mg | DELAYED_RELEASE_TABLET | Freq: Every day | ORAL | Status: DC
Start: 2017-03-10 — End: 2017-03-11
  Administered 2017-03-10 – 2017-03-11 (×2): 40 mg via ORAL
  Filled 2017-03-10 (×2): qty 1

## 2017-03-10 MED ORDER — ATORVASTATIN CALCIUM 10 MG PO TABS
10.0000 mg | ORAL_TABLET | Freq: Every day | ORAL | Status: DC
  Administered 2017-03-10 – 2017-03-11 (×2): 10 mg via ORAL
  Filled 2017-03-10 (×2): qty 1

## 2017-03-10 MED ORDER — ACETAMINOPHEN 325 MG PO TABS: 650.0000 mg | ORAL_TABLET | ORAL | Status: DC | PRN

## 2017-03-10 NOTE — Care Management Obs Status (Signed)
[facility] NOTIFICATION   Patient Details  Name: Shon Mansouri MRN: [id number] Date of Birth: [birth year]   Medicare Observation Status Notification Given:  Yes    Carles Collet, RN 03/10/2017, 3:25 PM

## 2017-03-10 NOTE — Progress Notes (Signed)
No any active chest pain noted during AM shift, vitals stable, Echo has been done, blood sugar was back up after she ate which was 67 once while on NPO for possible stress test, will continue to monitor the patient

## 2017-03-10 NOTE — Consult Note (Addendum)
Primary Physician: Primary Cardiologist:  Starlyn Skeans   Asked to see by Dr Hartford Poli for CP   HPI:  Pt is a 82 yo; with history of CAD (s/p PTCA/stent), HTN, DM, COPD, PPM for tachy brady syndrom  PAF  On chronic anticoagulation   Last cath in in March 2017   Showed moderate obstruction  With 40% prox LAD, 40% mid LAD  30% RCA  10% LCx (instent) Pt says her angina prior to the stent was neck discomfort and R shoulder pain  The patient says this past week she has had on and off lower abdominal pains  SIgnificant at times  Associated with nausea Yesterday evening she had just finishing with some work in [employer] a bite of a Nurse, children's to get her meds   The got profoundly sweaty  Developed some mid back discomfort (mid/lower back) Discomfort was mild She said if flet like gas  Spell lasted 10 to 15 min  Went away on own  EMS called  Brought to hospital  No discomfort or sweating since   No change in her acitvities  No Pain in chest or neck        Past Medical History:  Diagnosis Date  . CAD (coronary artery disease), native coronary artery    PTCA of distal right coronary artery 1997 Cypher stents to circumflex 2005 Cardiac cath in 2011 with patent stent to circumflex and moderate disease elsewhere treated medically   . Cardiac pacemaker in situ 12/24/2015   Original implant reportedly in 1991 for tachybradycardia syndrome, generator change in 1999, 2004 and evidently again in 2016 in [place]   . COPD (chronic obstructive pulmonary disease) ([place])   . Diabetes mellitus without complication ([place])   . Hypertension   . Hypothyroidism 03/23/2010  . Pacemaker   . Paroxysmal atrial fibrillation ([place]) 03/23/2010   CHA2DS2VASC score 5     Medications Prior to Admission  Medication Sig Dispense Refill  . atorvastatin (LIPITOR) 10 MG tablet Take 1 tablet (10 mg total) by mouth daily. 90 tablet 2  . carvedilol (COREG) 12.5 MG tablet Take 12.5 mg by mouth 2 (two) times daily  with a meal.    . Choline Fenofibrate (FENOFIBRIC ACID) 45 MG CPDR Take 45 mg by mouth daily.    Marland Kitchen dronedarone (MULTAQ) 400 MG tablet Take 400 mg by mouth 2 (two) times daily with a meal.    . esomeprazole (NEXIUM) 40 MG capsule Take 40 mg by mouth daily at 12 noon.    Marland Kitchen glipiZIDE (GLUCOTROL) 10 MG tablet Take 10 mg by mouth daily before breakfast.    . levothyroxine (SYNTHROID, LEVOTHROID) 125 MCG tablet Take 125 mcg by mouth daily before breakfast.    . linagliptin (TRADJENTA) 5 MG TABS tablet Take 5 mg by mouth daily.    Marland Kitchen losartan (COZAAR) 100 MG tablet Take 100 mg by mouth daily.    . metFORMIN (GLUCOPHAGE) 500 MG tablet Take 500 mg by mouth every evening.     . rivaroxaban (XARELTO) 20 MG TABS tablet Take 20 mg by mouth daily with supper.       Marland Kitchen atorvastatin  10 mg Oral Daily  . carvedilol  12.5 mg Oral BID WC  . dronedarone  400 mg Oral BID WC  . fenofibrate  54 mg Oral Daily  . insulin aspart  0-9 Units Subcutaneous TID WC  . levothyroxine  125 mcg Oral QAC breakfast  . losartan  100 mg Oral Daily  . nicotine  21 mg Transdermal Daily  . pantoprazole  40 mg Oral Daily  . rivaroxaban  20 mg Oral Q supper    Infusions:   No Known Allergies  Social History   Social History  . Marital status: Widowed    Spouse name: N/A  . Number of children: N/A  . Years of education: N/A   Occupational History  . Not on file.   Social History Main Topics  . Smoking status: Current Every Day Smoker    Packs/day: 0.50  . Smokeless tobacco: Never Used  . Alcohol use No  . Drug use: No  . Sexual activity: Not on file   Other Topics Concern  . Not on file   Social History Narrative  . No narrative on file    Family History  Problem Relation Age of Onset  . Heart disease Father   . Heart disease Mother   . Cirrhosis Brother   . Colon cancer Neg Hx   . Stomach cancer Neg Hx     REVIEW OF SYSTEMS:  All systems reviewed  Negative to the above problem except as noted  above.    PHYSICAL EXAM: Vitals:   03/10/17 0215 03/10/17 0325  BP: (!) 142/53 (!) 148/49  Pulse: 70 70  Resp: 17 18  Temp:  98.4 F (36.9 C)     Intake/Output Summary (Last 24 hours) at 03/10/17 1008 Last data filed at 03/10/17 0600  Gross per 24 hour  Intake                0 ml  Output              300 ml  Net             -300 ml    General:  Well appearing. No respiratory difficulty HEENT: normal Neck: supple. no JVD. Carotids 2+ bilat; no bruits. No lymphadenopathy or thryomegaly appreciated. Cor: PMI nondisplaced. Regular rate & rhythm. No rubs, gallops or murmurs. Lungs: clear Abdomen: soft, nontender, nondistended. No hepatosplenomegaly. No bruits or masses. Good bowel sounds. Extremities: no cyanosis, clubbing, rash, edema Neuro: alert & oriented x 3, cranial nerves grossly intact. moves all 4 extremities w/o difficulty. Affect pleasant.  ECG:  Atrial paced 70 bpm  T wave inversion V1 to v3  (old)    Results for orders placed or performed during the hospital encounter of 03/09/17 (from the past 24 hour(s))  Basic metabolic panel     Status: Abnormal   Collection Time: 03/09/17 11:47 PM  Result Value Ref Range   Sodium 139 135 - 145 mmol/L   Potassium 4.7 3.5 - 5.1 mmol/L   Chloride 110 101 - 111 mmol/L   CO2 19 (L) 22 - 32 mmol/L   Glucose, Bld 65 65 - 99 mg/dL   BUN 18 6 - 20 mg/dL   Creatinine, Ser 0.97 0.44 - 1.00 mg/dL   Calcium 9.4 8.9 - 10.3 mg/dL   GFR calc non Af Amer 54 (L) >60 mL/min   GFR calc Af Amer >60 >60 mL/min   Anion gap 10 5 - 15  Troponin I     Status: Abnormal   Collection Time: 03/09/17 11:47 PM  Result Value Ref Range   Troponin I 0.03 (HH) <0.03 ng/mL  CBC with Differential     Status: Abnormal   Collection Time: 03/09/17 11:47 PM  Result Value Ref Range   WBC 6.0 4.0 - 10.5 K/uL  RBC 4.29 3.87 - 5.11 MIL/uL   Hemoglobin 11.9 (L) 12.0 - 15.0 g/dL   HCT 37.1 36.0 - 46.0 %   MCV 86.5 78.0 - 100.0 fL   MCH 27.7 26.0 - 34.0 pg    MCHC 32.1 30.0 - 36.0 g/dL   RDW 14.5 11.5 - 15.5 %   Platelets 158 150 - 400 K/uL   Neutrophils Relative % 55 %   Neutro Abs 3.4 1.7 - 7.7 K/uL   Lymphocytes Relative 31 %   Lymphs Abs 1.8 0.7 - 4.0 K/uL   Monocytes Relative 12 %   Monocytes Absolute 0.7 0.1 - 1.0 K/uL   Eosinophils Relative 1 %   Eosinophils Absolute 0.1 0.0 - 0.7 K/uL   Basophils Relative 1 %   Basophils Absolute 0.0 0.0 - 0.1 K/uL  Troponin I-serum (0, 3, 6 hours)     Status: None   Collection Time: 03/10/17  2:50 AM  Result Value Ref Range   Troponin I <0.03 <0.03 ng/mL  Troponin I-serum (0, 3, 6 hours)     Status: None   Collection Time: 03/10/17  4:57 AM  Result Value Ref Range   Troponin I <0.03 <0.03 ng/mL  TSH     Status: Abnormal   Collection Time: 03/10/17  4:57 AM  Result Value Ref Range   TSH 0.053 (L) 0.350 - 4.500 uIU/mL  Lipid panel     Status: None   Collection Time: 03/10/17  4:57 AM  Result Value Ref Range   Cholesterol 132 0 - 200 mg/dL   Triglycerides 61 <150 mg/dL   HDL 56 >40 mg/dL   Total CHOL/HDL Ratio 2.4 RATIO   VLDL 12 0 - 40 mg/dL   LDL Cholesterol 64 0 - 99 mg/dL  CBC with Differential/Platelet     Status: Abnormal   Collection Time: 03/10/17  4:57 AM  Result Value Ref Range   WBC 6.0 4.0 - 10.5 K/uL   RBC 3.92 3.87 - 5.11 MIL/uL   Hemoglobin 10.8 (L) 12.0 - 15.0 g/dL   HCT 34.1 (L) 36.0 - 46.0 %   MCV 87.0 78.0 - 100.0 fL   MCH 27.6 26.0 - 34.0 pg   MCHC 31.7 30.0 - 36.0 g/dL   RDW 14.4 11.5 - 15.5 %   Platelets 175 150 - 400 K/uL   Neutrophils Relative % 48 %   Neutro Abs 2.9 1.7 - 7.7 K/uL   Lymphocytes Relative 42 %   Lymphs Abs 2.5 0.7 - 4.0 K/uL   Monocytes Relative 8 %   Monocytes Absolute 0.5 0.1 - 1.0 K/uL   Eosinophils Relative 2 %   Eosinophils Absolute 0.1 0.0 - 0.7 K/uL   Basophils Relative 0 %   Basophils Absolute 0.0 0.0 - 0.1 K/uL  Basic metabolic panel     Status: Abnormal   Collection Time: 03/10/17  4:57 AM  Result Value Ref Range   Sodium  138 135 - 145 mmol/L   Potassium 3.8 3.5 - 5.1 mmol/L   Chloride 106 101 - 111 mmol/L   CO2 24 22 - 32 mmol/L   Glucose, Bld 239 (H) 65 - 99 mg/dL   BUN 19 6 - 20 mg/dL   Creatinine, Ser 0.95 0.44 - 1.00 mg/dL   Calcium 9.3 8.9 - 10.3 mg/dL   GFR calc non Af Amer 55 (L) >60 mL/min   GFR calc Af Amer >60 >60 mL/min   Anion gap 8 5 - 15  Glucose, capillary  Status: Abnormal   Collection Time: 03/10/17  7:36 AM  Result Value Ref Range   Glucose-Capillary 175 (H) 65 - 99 mg/dL   Comment 1 Notify RN    Comment 2 Document in Chart    Dg Chest 2 View  Result Date: 03/09/2017 CLINICAL DATA:  Transient back pain and diaphoresis. EXAM: CHEST  2 VIEW COMPARISON:  Radiograph 06/06/2016 FINDINGS: Multi lead right-sided pacemaker in place, leads unchanged in position. Probable external monitoring device projecting over the thoracic inlet centrally. Unchanged heart size and mediastinal contours with atherosclerosis of the aortic arch. Stable hyperinflation and chronic interstitial coarsening. Lingular scarring is unchanged. No consolidation, pleural fluid or pneumothorax. No acute osseous abnormality is seen. IMPRESSION: 1. No evidence of acute abnormality. 2. Stable in chronic lung disease hyperinflation and interstitial coarsening consistent with COPD. 3. Right-sided pacemaker in place.  Thoracic aortic atherosclerosis. Electronically Signed   By: Jeb Levering M.D.   On: 03/09/2017 23:47     ASSESSMENT:  Pt is a 82 yo with known CAD  Presented yesterday with diaphoresis and back pain  Mild  Discomfort  Episode was very different from her typical angina  Note that she did have some lower GI symptoms earlier in week   Exam is unremarkable  Labs Trop peak at 0.03 (upper normal)  EKG without acute changes   I am not sure what lead to spell  I do nt think cardiac in origin  ? GI   CT scan in Feb of abd showed some atherosclerosis of aorta but no aneurysm.     I would have pt ambulate  Would keep  on same meds   I would not pursue cardiac testing based on current symptoms  Continue outpt meds   She has not had any med changes to sugg a side effect    Will be available if symptoms change  2  CAD  Continue current meds  Stop ASA     3  HL  Contiue lipitor and fenofibrate    4  PAF  Continue on multaq and Xarelto    5  Anemia  H/H rel stable    6  Thyroid  Needs to have supplement decreased  Ques if this could explain symptoms    IM to cover   Will make sure pt hs f/u    Dorris Carnes

## 2017-03-10 NOTE — ED Notes (Signed)
Admitting Provider at bedside. 

## 2017-03-10 NOTE — H&P (Addendum)
History and Physical    [name] INO:[id number] DOB: 17-Nov-1934 DOA: 03/09/2017  Referring MD/NP/PA: Dr. Regenia Skeeter PCP: Merrilee Seashore, MD  Patient coming from: Home via EMS  Chief Complaint: "Broke out in a cold sweat"  HPI: [name] is a 82 y.o. female with medical history significant of HTN, HLD, CAD s/p PTCA to the RCA in 1997, DES to Left Circumflex 2005, moderate multivessel disease in 2009),  DM type II, PAF on Xarelto, SSS s/p PM, COPD, tobacco abuse, and hypothyroidism; who presents with complaints of breaking out in a cold sweat. Patient reports that she was sitting at home about get herself something to eat and take her nightly meds when symptoms started. Patient reports profusely sweating all over and reports discomfort in her back below her shoulder blades like a gas pocket. She states that she remains sitting and told family to call EMS. Symptoms lasted for approximately 10-15 minutes and then self resolved. Patient denies having any significant palpitations, lightheadedness, nausea, vomiting, recent trips, leg swelling, change in weight, calf pain, or sick contacts. She did not try anything to relieve symptoms. At baseline patient has a chronic cough and still continues to smoke on a daily basis. Upon EMS arrival they noted that the patient was diaphoretic and she was given aspirin prior to arrival.   Patient sees Dr. Daneen Schick of cardiology as an outpatient. She also recently had her pacemaker checked on 02/04/2017.  ED Course: Upon admission to the emergency department patient was noted to be afebrile, pulse 69-73, respirations 14-23, and all other vitals within normal limits. Labs revealed hemoglobin 11.9, glucose 65, troponin 0.03   Review of Systems: As per HPI otherwise 10 point review of systems negative.   Past Medical History:  Diagnosis Date  . CAD (coronary artery disease), native coronary artery    PTCA of distal right coronary artery 1997 Cypher stents to  circumflex 2005 Cardiac cath in 2011 with patent stent to circumflex and moderate disease elsewhere treated medically   . Cardiac pacemaker in situ 12/24/2015   Original implant reportedly in 1991 for tachybradycardia syndrome, generator change in 1999, 2004 and evidently again in 2016 in [place]   . COPD (chronic obstructive pulmonary disease) ([place])   . Diabetes mellitus without complication ([place])   . Hypertension   . Hypothyroidism 03/23/2010  . Pacemaker   . Paroxysmal atrial fibrillation ([place]) 03/23/2010   CHA2DS2VASC score 5     Past Surgical History:  Procedure Laterality Date  . CARDIAC CATHETERIZATION N/A 12/26/2015   Procedure: Left Heart Cath and Coronary Angiography;  Surgeon: Peter M [name], MD;  Location: [facility];  Service: Cardiovascular;  Laterality: N/A;  . CARDIOVERSION  05/2015  . CHOLECYSTECTOMY  2012  . CORONARY ANGIOPLASTY WITH STENT PLACEMENT  1990  . HERNIA REPAIR    . PACEMAKER GENERATOR CHANGE  [id number], 2016  . PACEMAKER INSERTION  1991     reports that she has been smoking.  She has been smoking about 0.50 packs per day. She has never used smokeless tobacco. She reports that she does not drink alcohol or use drugs.  No Known Allergies  Family History  Problem Relation Age of Onset  . Heart disease Father   . Heart disease Mother   . Cirrhosis Brother   . Colon cancer Neg Hx   . Stomach cancer Neg Hx     Prior to Admission medications   Medication Sig Start Date End Date Taking? Authorizing Provider  atorvastatin (  LIPITOR) 10 MG tablet Take 1 tablet (10 mg total) by mouth daily. 05/07/16  Yes Camnitz, Will Hassell Done, MD  carvedilol (COREG) 12.5 MG tablet Take 12.5 mg by mouth 2 (two) times daily with a meal.   Yes [provider]  Choline Fenofibrate (FENOFIBRIC ACID) 45 MG CPDR Take 45 mg by mouth daily.   Yes [provider]  dronedarone (MULTAQ) 400 MG tablet Take 400 mg by mouth 2 (two) times daily with a meal.   Yes  [provider]  esomeprazole (NEXIUM) 40 MG capsule Take 40 mg by mouth daily at 12 noon.   Yes [provider]  glipiZIDE (GLUCOTROL) 10 MG tablet Take 10 mg by mouth daily before breakfast.   Yes [provider]  levothyroxine (SYNTHROID, LEVOTHROID) 125 MCG tablet Take 125 mcg by mouth daily before breakfast.   Yes [provider]  linagliptin (TRADJENTA) 5 MG TABS tablet Take 5 mg by mouth daily.   Yes [provider]  losartan (COZAAR) 100 MG tablet Take 100 mg by mouth daily.   Yes [provider]  metFORMIN (GLUCOPHAGE) 500 MG tablet Take 500 mg by mouth every evening.    Yes [provider]  rivaroxaban (XARELTO) 20 MG TABS tablet Take 20 mg by mouth daily with supper.   Yes [provider]    Physical Exam:  Constitutional:Elderly female in NAD, calm, comfortable Vitals:   03/10/17 0000 03/10/17 0015 03/10/17 0100 03/10/17 0115  BP: 136/64 (!) 108/95 (!) 156/75 (!) 151/60  Pulse: 70 69 70 70  Resp: 20 (!) 21 (!) 23 16  Temp:      TempSrc:      SpO2: 98% 96% 96% 97%   Eyes: PERRL, lids and conjunctivae normal ENMT: Mucous membranes are moist. Posterior pharynx clear of any exudate or lesions. Neck: normal, supple, no masses, no thyromegaly Respiratory: Decreased overall air movement with increased AP diameter of chest wall. no wheezing, no crackles. Normal respiratory effort. No accessory muscle use.  Cardiovascular: Regular rate and rhythm, no murmurs / rubs / gallops. No extremity edema. 2+ pedal pulses. No carotid bruits.  Abdomen: no tenderness, no masses palpated. No hepatosplenomegaly. Bowel sounds positive.  Musculoskeletal: no clubbing / cyanosis. No joint deformity upper and lower extremities. Good ROM, no contractures. Normal muscle tone.  Skin: no rashes, lesions, ulcers. No induration Neurologic: CN 2-12 grossly intact. Sensation intact, DTR normal. Strength 5/5 in all 4.  Psychiatric: Normal  judgment and insight. Alert and oriented x 3. Normal mood.     Labs on Admission: I have personally reviewed following labs and imaging studies  CBC:  Recent Labs Lab 03/09/17 2347  WBC 6.0  NEUTROABS 3.4  HGB 11.9*  HCT 37.1  MCV 86.5  PLT 829   Basic Metabolic Panel:  Recent Labs Lab 03/09/17 2347  NA 139  K 4.7  CL 110  CO2 19*  GLUCOSE 65  BUN 18  CREATININE 0.97  CALCIUM 9.4   GFR: CrCl cannot be calculated (Unknown ideal weight.). Liver Function Tests: No results for input(s): AST, ALT, ALKPHOS, BILITOT, PROT, ALBUMIN in the last 168 hours. No results for input(s): LIPASE, AMYLASE in the last 168 hours. No results for input(s): AMMONIA in the last 168 hours. Coagulation Profile: No results for input(s): INR, PROTIME in the last 168 hours. Cardiac Enzymes:  Recent Labs Lab 03/09/17 2347  TROPONINI 0.03*   BNP (last 3 results) No results for input(s): PROBNP in the last 8760 hours. HbA1C:  No results for input(s): HGBA1C in the last 72 hours. CBG: No results for input(s): GLUCAP in the last 168 hours. Lipid Profile: No results for input(s): CHOL, HDL, LDLCALC, TRIG, CHOLHDL, LDLDIRECT in the last 72 hours. Thyroid Function Tests: No results for input(s): TSH, T4TOTAL, FREET4, T3FREE, THYROIDAB in the last 72 hours. Anemia Panel: No results for input(s): VITAMINB12, FOLATE, FERRITIN, TIBC, IRON, RETICCTPCT in the last 72 hours. Urine analysis: No results found for: COLORURINE, APPEARANCEUR, LABSPEC, PHURINE, GLUCOSEU, HGBUR, BILIRUBINUR, KETONESUR, PROTEINUR, UROBILINOGEN, NITRITE, LEUKOCYTESUR Sepsis Labs: No results found for this or any previous visit (from the past 240 hour(s)).   Radiological Exams on Admission: Dg Chest 2 View  Result Date: 03/09/2017 CLINICAL DATA:  Transient back pain and diaphoresis. EXAM: CHEST  2 VIEW COMPARISON:  Radiograph 06/06/2016 FINDINGS: Multi lead right-sided pacemaker in place, leads unchanged in position.  Probable external monitoring device projecting over the thoracic inlet centrally. Unchanged heart size and mediastinal contours with atherosclerosis of the aortic arch. Stable hyperinflation and chronic interstitial coarsening. Lingular scarring is unchanged. No consolidation, pleural fluid or pneumothorax. No acute osseous abnormality is seen. IMPRESSION: 1. No evidence of acute abnormality. 2. Stable in chronic lung disease hyperinflation and interstitial coarsening consistent with COPD. 3. Right-sided pacemaker in place.  Thoracic aortic atherosclerosis. Electronically Signed   By: Jeb Levering M.D.   On: 03/09/2017 23:47    EKG: Independently reviewed. Accelerated junctional rhythm, but similar to previous EKGs.  Assessment/Plan Chest pain: Patient presents with diaphoresis and discomfort to the back. Given Heart score 7 question possibility of cardiac cause of symptoms. Last echocardiogram done on 11/2015 showed EF of 55-60% with severely dilated left atrium. She also had a cardiac cath at that time and was noted to have nonobstructive coronary artery disease diffuse with good LV function. - Admit to telemetry bed - Trend cardiac troponins 3 - Check lipid panel - Check echocardiogram - Will consult to cardiology in a.m. for further testing  Elevated troponin: Patient's initial troponin just mildly elevated at 0.3 - As seen above  CAD s/p PTCA to the RCA in 1997, DES to Left Circumflex 2005, moderate multivessel disease in 2009. Last cardiac cath was in 11/2015 showing diffuse nonobstructive coronary artery disease. -  As seen above  Diabetes mellitus type 2 with hypoglycemia: Acute. Patient initial blood glucose was noted to be low at 65. - Hypoglycemic protocols  - Hold glipizide, Metformin, or Tradjenta - CBGs every before meals and at bedtime with sensitive SSI  Essential hypertension - Continue Coreg and losartan  Paroxysmal atrial fibrillation on chronic anticoagulation:  Chadsvasc = 5 - Continue Xarelto, Coreg, and Multaq  Sick sinus syndrome status post pacemaker: Pacemaker was last interrogated on 5/8 per review of records. - Continue to monitor  Hyperlipidemia - Continue atorvastatin and Fenodibric acid  Hypothyroidism - Check TSH - Continue levothyroxine  H/O COPD: stable  Tobacco abuse  - Counseled the patient on the cessation of tobacco - Nicotine patch   DVT prophylaxis: Xarelto  Code Status: Full Family Communication: Discussed plan of care with patient and family present at bedside Disposition Plan: Possible discharge home if workup negative  Consults called: None  Admission status: Observation  Norval Morton MD [facility] Pager [phone]  If 7PM-7AM, please contact night-coverage [email or web address] Password [email or web address]  03/10/2017, 1:52 AM

## 2017-03-10 NOTE — Progress Notes (Signed)
PROGRESS NOTE  [name]  WUJ:[id number] DOB: 1935-01-29 DOA: 03/09/2017 PCP: Merrilee Seashore, MD Outpatient Specialists:  Subjective: Denies any chest pain this morning  Brief Narrative:  [name] is [name] 82 y.o. female with medical history significant of HTN, HLD, CAD s/p PTCA to the RCA in 1997, DES to Left Circumflex 2005, moderate multivessel disease in 2009),  DM type II, PAF on Xarelto, SSS s/p PM, COPD, tobacco abuse, and hypothyroidism; who presents with complaints of breaking out in [name] cold sweat. Patient reports that she was sitting at home about get herself something to eat and take her nightly meds when symptoms started. Patient reports profusely sweating all over and reports discomfort in her back below her shoulder blades like [name] gas pocket. She states that she remains sitting and told family to call EMS. Symptoms lasted for approximately 10-15 minutes and then self resolved. Patient denies having any significant palpitations, lightheadedness, nausea, vomiting, recent trips, leg swelling, change in weight, calf pain, or sick contacts. She did not try anything to relieve symptoms. At baseline patient has [name] chronic cough and still continues to smoke on [name] daily basis. Upon EMS arrival they noted that the patient was diaphoretic and she was given aspirin prior to arrival.   Assessment & Plan:   Principal Problem:   Chest pain Active Problems:   Hypothyroidism   Paroxysmal atrial fibrillation (HCC)   Cardiac pacemaker in situ   Diabetes mellitus without complication ([place])   Current use of long term anticoagulation   This is [name] no charge note, patient seen earlier today by my colleague Dr. Tamala Julian. Records and data base reviewed, came in with pressure-like back pain, treated as chest pain. Cardiology consulted for further evaluation.  Chest pain: Patient presents with diaphoresis and discomfort to the back. Given Heart score 7 question possibility of cardiac cause of symptoms.  Last echocardiogram done on 11/2015 showed EF of 55-60% with severely dilated left atrium. She also had [name] cardiac cath at that time and was noted to have nonobstructive coronary artery disease diffuse with good LV function. - Admit to telemetry bed - Trend cardiac troponins 3 - Check lipid panel - Check echocardiogram - Will consult to cardiology in [name].m. for further testing  Elevated troponin: Patient's initial troponin just mildly elevated at 0.3 - As seen above  CAD s/p PTCA to the RCA in 1997, DES to Left Circumflex 2005, moderate multivessel disease in 2009. Last cardiac cath was in 11/2015 showing diffuse nonobstructive coronary artery disease. -  As seen above  Diabetes mellitus type 2 with hypoglycemia: Acute. Patient initial blood glucose was noted to be low at 65. - Hypoglycemic protocols  - Hold glipizide, Metformin, or Tradjenta - CBGs every before meals and at bedtime with sensitive SSI  Essential hypertension - Continue Coreg and losartan  Paroxysmal atrial fibrillation on chronic anticoagulation: Chadsvasc = 5 - Continue Xarelto, Coreg, and Multaq  Sick sinus syndrome status post pacemaker: Pacemaker was last interrogated on 5/8 per review of records. - Continue to monitor  Hyperlipidemia - Continue atorvastatin and Fenodibric acid  Hypothyroidism - Check TSH - Continue levothyroxine  H/O COPD: stable  Tobacco abuse  - Counseled the patient on the cessation of tobacco - Nicotine patch    DVT prophylaxis:  Code Status: Full Code Family Communication:  Disposition Plan:  Diet: Diet NPO time specified  Consultants:   Cards  Procedures:   None  Antimicrobials:   None   Objective: Vitals:   03/10/17 0145  03/10/17 0200 03/10/17 0215 03/10/17 0325  BP: (!) 149/57 (!) 145/76 (!) 142/53 (!) 148/49  Pulse: 73 69 70 70  Resp: 20 18 17 18   Temp:    98.4 F (36.9 C)  TempSrc:    Oral  SpO2: 98% 97% 99% 97%  Weight:    62.1 kg (137 lb)    Height:    5\' 2"  (1.575 m)    Intake/Output Summary (Last 24 hours) at 03/10/17 1001 Last data filed at 03/10/17 0600  Gross per 24 hour  Intake                0 ml  Output              300 ml  Net             -300 ml   Filed Weights   03/10/17 0325  Weight: 62.1 kg (137 lb)    Examination: General exam: Appears calm and comfortable  Respiratory system: Clear to auscultation. Respiratory effort normal. Cardiovascular system: S1 & S2 heard, RRR. No JVD, murmurs, rubs, gallops or clicks. No pedal edema. Gastrointestinal system: Abdomen is nondistended, soft and nontender. No organomegaly or masses felt. Normal bowel sounds heard. Central nervous system: Alert and oriented. No focal neurological deficits. Extremities: Symmetric 5 x 5 power. Skin: No rashes, lesions or ulcers Psychiatry: Judgement and insight appear normal. Mood & affect appropriate.   Data Reviewed: I have personally reviewed following labs and imaging studies  CBC:  Recent Labs Lab 03/09/17 2347 03/10/17 0457  WBC 6.0 6.0  NEUTROABS 3.4 2.9  HGB 11.9* 10.8*  HCT 37.1 34.1*  MCV 86.5 87.0  PLT 158 382   Basic Metabolic Panel:  Recent Labs Lab 03/09/17 2347 03/10/17 0457  NA 139 138  K 4.7 3.8  CL 110 106  CO2 19* 24  GLUCOSE 65 239*  BUN 18 19  CREATININE 0.97 0.95  CALCIUM 9.4 9.3   GFR: Estimated Creatinine Clearance: 41.6 mL/min (by C-G formula based on SCr of 0.95 mg/dL). Liver Function Tests: No results for input(s): AST, ALT, ALKPHOS, BILITOT, PROT, ALBUMIN in the last 168 hours. No results for input(s): LIPASE, AMYLASE in the last 168 hours. No results for input(s): AMMONIA in the last 168 hours. Coagulation Profile: No results for input(s): INR, PROTIME in the last 168 hours. Cardiac Enzymes:  Recent Labs Lab 03/09/17 2347 03/10/17 0250 03/10/17 0457  TROPONINI 0.03* <0.03 <0.03   BNP (last 3 results) No results for input(s): PROBNP in the last 8760 hours. HbA1C: No  results for input(s): HGBA1C in the last 72 hours. CBG:  Recent Labs Lab 03/10/17 0736  GLUCAP 175*   Lipid Profile:  Recent Labs  03/10/17 0457  CHOL 132  HDL 56  LDLCALC 64  TRIG 61  CHOLHDL 2.4   Thyroid Function Tests:  Recent Labs  03/10/17 0457  TSH 0.053*   Anemia Panel: No results for input(s): VITAMINB12, FOLATE, FERRITIN, TIBC, IRON, RETICCTPCT in the last 72 hours. Urine analysis: No results found for: COLORURINE, APPEARANCEUR, LABSPEC, PHURINE, GLUCOSEU, HGBUR, BILIRUBINUR, KETONESUR, PROTEINUR, UROBILINOGEN, NITRITE, LEUKOCYTESUR Sepsis Labs: @LABRCNTIP (procalcitonin:4,lacticidven:4)  )No results found for this or any previous visit (from the past 240 hour(s)).   Invalid input(s): PROCALCITONIN, LACTICACIDVEN   Radiology Studies: Dg Chest 2 View  Result Date: 03/09/2017 CLINICAL DATA:  Transient back pain and diaphoresis. EXAM: CHEST  2 VIEW COMPARISON:  Radiograph 06/06/2016 FINDINGS: Multi lead right-sided pacemaker in place, leads unchanged in position. Probable external  monitoring device projecting over the thoracic inlet centrally. Unchanged heart size and mediastinal contours with atherosclerosis of the aortic arch. Stable hyperinflation and chronic interstitial coarsening. Lingular scarring is unchanged. No consolidation, pleural fluid or pneumothorax. No acute osseous abnormality is seen. IMPRESSION: 1. No evidence of acute abnormality. 2. Stable in chronic lung disease hyperinflation and interstitial coarsening consistent with COPD. 3. Right-sided pacemaker in place.  Thoracic aortic atherosclerosis. Electronically Signed   By: Jeb Levering M.D.   On: 03/09/2017 23:47        Scheduled Meds: . atorvastatin  10 mg Oral Daily  . carvedilol  12.5 mg Oral BID WC  . dronedarone  400 mg Oral BID WC  . fenofibrate  54 mg Oral Daily  . insulin aspart  0-9 Units Subcutaneous TID WC  . levothyroxine  125 mcg Oral QAC breakfast  . losartan  100 mg  Oral Daily  . nicotine  21 mg Transdermal Daily  . pantoprazole  40 mg Oral Daily  . rivaroxaban  20 mg Oral Q supper   Continuous Infusions:   LOS: 0 days    Time spent: 35 minutes    Robin Lewellyn A, MD [facility] Pager [phone]  If 7PM-7AM, please contact night-coverage [email or web address] Password [email or web address] 03/10/2017, 10:01 AM

## 2017-03-10 NOTE — Progress Notes (Signed)
*  PRELIMINARY RESULTS* Echocardiogram 2D Echocardiogram has been performed.  [name] 03/10/2017, 4:52 PM

## 2017-03-10 NOTE — ED Notes (Signed)
Date and time results received: 03/10/17 0051  Test: Troponin Critical Value: 0.03  Name of Provider Notified: Dr. Regenia Skeeter  Orders Received? Or Actions Taken?:

## 2017-03-11 DIAGNOSIS — M546 Pain in thoracic spine: Secondary | ICD-10-CM | POA: Diagnosis not present

## 2017-03-11 DIAGNOSIS — E119 Type 2 diabetes mellitus without complications: Secondary | ICD-10-CM

## 2017-03-11 DIAGNOSIS — Z7901 Long term (current) use of anticoagulants: Secondary | ICD-10-CM | POA: Diagnosis not present

## 2017-03-11 DIAGNOSIS — K219 Gastro-esophageal reflux disease without esophagitis: Secondary | ICD-10-CM | POA: Diagnosis not present

## 2017-03-11 DIAGNOSIS — R079 Chest pain, unspecified: Secondary | ICD-10-CM | POA: Diagnosis not present

## 2017-03-11 DIAGNOSIS — Z950 Presence of cardiac pacemaker: Secondary | ICD-10-CM | POA: Diagnosis not present

## 2017-03-11 DIAGNOSIS — I088 Other rheumatic multiple valve diseases: Secondary | ICD-10-CM | POA: Diagnosis not present

## 2017-03-11 LAB — GLUCOSE, CAPILLARY
GLUCOSE-CAPILLARY: 175 mg/dL — AB (ref 65–99)
Glucose-Capillary: 120 mg/dL — ABNORMAL HIGH (ref 65–99)
Glucose-Capillary: 106 mg/dL — ABNORMAL HIGH (ref 65–99)

## 2017-03-11 MED ORDER — LEVOTHYROXINE SODIUM 112 MCG PO TABS: 112.0000 ug | ORAL_TABLET | Freq: Every day | ORAL | 1 refills | Status: DC

## 2017-03-11 MED ORDER — POTASSIUM CHLORIDE CRYS ER 20 MEQ PO TBCR
40.0000 meq | EXTENDED_RELEASE_TABLET | Freq: Once | ORAL | Status: AC
  Administered 2017-03-11: 40 meq via ORAL
  Filled 2017-03-11: qty 2

## 2017-03-11 NOTE — Discharge Summary (Signed)
Physician Discharge Summary  [name] [id number]:[id number] DOB: [birth year] DOA: 03/09/2017  PCP: Merrilee Seashore, MD  Admit date: 03/09/2017 Discharge date: 03/11/2017  Admitted From: Home Disposition: Home  Recommendations for Outpatient Follow-up:  1. Follow up with PCP in 1-2 weeks 2. Please obtain BMP/CBC in one week, Follow-up TSH in 4-6 weeks.  Home Health: NA Equipment/Devices:NA  Discharge Condition: Stable CODE STATUS: Full Code Diet recommendation: Diet heart healthy/carb modified Room service appropriate? Yes; Fluid consistency: Thin Diet - low sodium heart healthy  Brief/Interim Summary: [name] a 82 y.o.femalewith medical history significant of HTN, HLD, CAD s/p PTCA to the RCA in 1997, DES to Left Circumflex 2005, moderate multivessel disease in 2009), DM type II, PAFon Xarelto, SSS s/p PM, COPD, tobacco abuse, andhypothyroidism; who presents with complaints of breaking out in a cold sweat. Patient reports that she was sitting at home about get herself something to eat and take her nightly meds when symptoms started. Patient reports profusely sweating all over and reports discomfort in her back below her shoulder blades like a gas pocket. She states that she remains sitting and told family to call EMS. Symptoms lasted for approximately 10-15 minutes and then self resolved. Patient denies having any significant palpitations, lightheadedness, nausea, vomiting, recent trips, leg swelling, change in weight, calf pain, or sick contacts. She did not try anything to relieve symptoms. At baseline patient has a chronic cough and still continues to smoke on a daily basis. Upon EMS arrival they noted that the patient was diaphoretic and she was given aspirin prior to arrival.    Discharge Diagnoses:  Principal Problem:   Chest pain Active Problems:   Hypothyroidism   Paroxysmal atrial fibrillation (HCC)   Cardiac pacemaker in situ   Diabetes mellitus without complication  ([place])   Current use of long term anticoagulation   Acute thoracic back pain   Chest pain: Patient presents with diaphoresis and discomfort tothe back. Given Heart score 7question possibility of cardiac cause of symptoms. Last echocardiogram done on 11/2015 showed EF of 55-60% with severely dilated left atrium. She also had a cardiac cath at that time and was noted to have nonobstructive coronary artery disease diffuse with good LV function. -Admitted to telemetry, cardiology consulted. -Patient pain characters not consistent with ACS. -3 sets of cardiac enzymes negative for ischemia, 12-lead EKG showed  Elevated troponin:Patient's initial troponin just mildly elevated at 0.3 -The following 3 sets all were negative.  CAD s/p PTCA to the RCA in 1997, DES to Left Circumflex 2005, moderate multivessel disease in 2009.Last cardiac cath was in 11/2015 showing diffuse nonobstructive coronary artery disease. - As seen above  Diabetes mellitus type 2 with hypoglycemia:Acute. Patient initial blood glucose was noted to be low at 65. -On  Metformin and Tradjenta both held during the hospital stay and restarted on discharge. -Treated with carb modified diet and SSI during hospital stay.  Essential hypertension - Continue Coreg and losartan  Paroxysmal atrial fibrillationon chronic anticoagulation: Chadsvasc = 5 - Continue Xarelto, Coreg, and Multaq  Sick sinus syndrome status post pacemaker: Pacemaker was last interrogated on 5/8per review of records. - Continue to monitor  Hyperlipidemia - Continue atorvastatin and Fenodibric acid  Hypothyroidism -TSH is suppressed at 0.053, she was on 125 g of Synthroid. -Synthroid switched to 112 g, prescription given. Follow TSH in 4-6 weeks..  H/O COPD:stable  Tobacco abuse  -Counseled extensively about tobacco cessation, she used nicotine patch in the hospital. -Reported she smokes about a third of pack  a day.   Discharge  Instructions  Discharge Instructions    Diet - low sodium heart healthy    Complete by:  As directed    Increase activity slowly    Complete by:  As directed      Allergies as of 03/11/2017   No Known Allergies     Medication List    TAKE these medications   atorvastatin 10 MG tablet Commonly known as:  LIPITOR Take 1 tablet (10 mg total) by mouth daily.   carvedilol 12.5 MG tablet Commonly known as:  COREG Take 12.5 mg by mouth 2 (two) times daily with a meal.   dronedarone 400 MG tablet Commonly known as:  MULTAQ Take 400 mg by mouth 2 (two) times daily with a meal.   esomeprazole 40 MG capsule Commonly known as:  NEXIUM Take 40 mg by mouth daily at 12 noon.   Fenofibric Acid 45 MG Cpdr Take 45 mg by mouth daily.   glipiZIDE 10 MG tablet Commonly known as:  GLUCOTROL Take 10 mg by mouth daily before breakfast.   levothyroxine 112 MCG tablet Commonly known as:  SYNTHROID, LEVOTHROID Take 1 tablet (112 mcg total) by mouth daily before breakfast. What changed:  medication strength  how much to take   losartan 100 MG tablet Commonly known as:  COZAAR Take 100 mg by mouth daily.   metFORMIN 500 MG tablet Commonly known as:  GLUCOPHAGE Take 500 mg by mouth every evening.   rivaroxaban 20 MG Tabs tablet Commonly known as:  XARELTO Take 20 mg by mouth daily with supper.   TRADJENTA 5 MG Tabs tablet Generic drug:  linagliptin Take 5 mg by mouth daily.      Follow-up Information    Merrilee Seashore, MD Follow up in 1 week(s).   Specialty:  Internal Medicine Contact information: [address] [place] [address] [phone]          No Known Allergies  Consultations:  Cardiology   Procedures (Echo, Carotid, EGD, Colonoscopy, ERCP)   Radiological studies: Dg Chest 2 View  Result Date: 03/09/2017 CLINICAL DATA:  Transient back pain and diaphoresis. EXAM: CHEST  2 VIEW COMPARISON:  Radiograph 06/06/2016 FINDINGS:  Multi lead right-sided pacemaker in place, leads unchanged in position. Probable external monitoring device projecting over the thoracic inlet centrally. Unchanged heart size and mediastinal contours with atherosclerosis of the aortic arch. Stable hyperinflation and chronic interstitial coarsening. Lingular scarring is unchanged. No consolidation, pleural fluid or pneumothorax. No acute osseous abnormality is seen. IMPRESSION: 1. No evidence of acute abnormality. 2. Stable in chronic lung disease hyperinflation and interstitial coarsening consistent with COPD. 3. Right-sided pacemaker in place.  Thoracic aortic atherosclerosis. Electronically Signed   By: Jeb Levering M.D.   On: 03/09/2017 23:47     Subjective:  Discharge Exam: Vitals:   03/10/17 1209 03/10/17 2101 03/11/17 0134 03/11/17 0605  BP: (!) 119/57 (!) 144/50 (!) 141/57 131/71  Pulse: 70 71 76 79  Resp: 20 18 18 18   Temp: 98.1 F (36.7 C) 98.2 F (36.8 C) 98 F (36.7 C) 97.8 F (36.6 C)  TempSrc: Oral Oral Oral Oral  SpO2: 97% 98% 99% 98%  Weight:    61.8 kg (136 lb 3.2 oz)  Height:       General: Pt is alert, awake, not in acute distress Cardiovascular: RRR, S1/S2 +, no rubs, no gallops Respiratory: CTA bilaterally, no wheezing, no rhonchi Abdominal: Soft, NT, ND, bowel sounds + Extremities: no edema,  no cyanosis   The results of significant diagnostics from this hospitalization (including imaging, microbiology, ancillary and laboratory) are listed below for reference.    Microbiology: No results found for this or any previous visit (from the past 240 hour(s)).   Labs: BNP (last 3 results) No results for input(s): BNP in the last 8760 hours. Basic Metabolic Panel:  Recent Labs Lab 03/09/17 2347 03/10/17 0457  NA 139 138  K 4.7 3.8  CL 110 106  CO2 19* 24  GLUCOSE 65 239*  BUN 18 19  CREATININE 0.97 0.95  CALCIUM 9.4 9.3   Liver Function Tests: No results for input(s): AST, ALT, ALKPHOS, BILITOT,  PROT, ALBUMIN in the last 168 hours. No results for input(s): LIPASE, AMYLASE in the last 168 hours. No results for input(s): AMMONIA in the last 168 hours. CBC:  Recent Labs Lab 03/09/17 2347 03/10/17 0457  WBC 6.0 6.0  NEUTROABS 3.4 2.9  HGB 11.9* 10.8*  HCT 37.1 34.1*  MCV 86.5 87.0  PLT 158 175   Cardiac Enzymes:  Recent Labs Lab 03/09/17 2347 03/10/17 0250 03/10/17 0457 03/10/17 1147  TROPONINI 0.03* <0.03 <0.03 <0.03   BNP: Invalid input(s): POCBNP CBG:  Recent Labs Lab 03/10/17 1144 03/10/17 1322 03/10/17 1606 03/10/17 2125 03/11/17 0803  GLUCAP 68 127* 126* 120* 106*   D-Dimer No results for input(s): DDIMER in the last 72 hours. Hgb A1c No results for input(s): HGBA1C in the last 72 hours. Lipid Profile  Recent Labs  03/10/17 0457  CHOL 132  HDL 56  LDLCALC 64  TRIG 61  CHOLHDL 2.4   Thyroid function studies  Recent Labs  03/10/17 0457  TSH 0.053*   Anemia work up No results for input(s): VITAMINB12, FOLATE, FERRITIN, TIBC, IRON, RETICCTPCT in the last 72 hours. Urinalysis No results found for: COLORURINE, APPEARANCEUR, LABSPEC, [place], GLUCOSEU, HGBUR, BILIRUBINUR, KETONESUR, PROTEINUR, UROBILINOGEN, NITRITE, LEUKOCYTESUR Sepsis Labs Invalid input(s): PROCALCITONIN,  WBC,  LACTICIDVEN Microbiology No results found for this or any previous visit (from the past 240 hour(s)).   Time coordinating discharge: Over 30 minutes  SIGNED:   Birdie Hopes, MD  [facility] 03/11/2017, 9:19 AM Pager   If 7PM-7AM, please contact night-coverage [email or web address] Password [email or web address]

## 2017-03-11 NOTE — Discharge Instructions (Signed)

## 2017-03-18 NOTE — Progress Notes (Signed)
Cardiology Office Note    Date:  03/19/2017   ID:  [name], DOB 1935-05-01, MRN [id number]  PCP:  Merrilee Seashore, MD  Cardiologist:  Dr. Tamala Julian  Electrophysiologist: Dr. Curt Bears  Chief Complaint: Hospital follow up for chest pain   History of Present Illness:   [name] is a 82 y.o. female CAD s/p previous coronary stenting including circumflex and right coronary arteries, hypertension, diabetes, COPD, permanent pacemaker for tachybradycardia syndrome, ongoing tobacco abuse and PAF on Xarelto and multaq presents for follow up.   Last cath in in March 2017 showed moderate obstruction with 40% prox LAD, 40% mid LAD,  30% RCA,  10% LCx (instent).   Admitted 03/10/17 for atypical chest pain. She ruled out and discharged. ? GI related. Stopped ASA. TSH was low, adjusted medications.   The patient is here for follow-up. She has not required any nitroglycerin in many years. He denies recurrent symptoms. She denies exertional chest pain or shortness of breath. No orthopnea, PND, syncope, dizziness, lower extremity edema, melena, palpitation or blood in her stool or urine. Compliant with medication.  Past Medical History:  Diagnosis Date  . CAD (coronary artery disease), native coronary artery    PTCA of distal right coronary artery 1997 Cypher stents to circumflex 2005 Cardiac cath in 2011 with patent stent to circumflex and moderate disease elsewhere treated medically   . Cardiac pacemaker in situ 12/24/2015   Original implant reportedly in 1991 for tachybradycardia syndrome, generator change in 1999, 2004 and evidently again in 2016 in [place]   . COPD (chronic obstructive pulmonary disease) ([place])   . Diabetes mellitus without complication ([place])   . Hypertension   . Hypothyroidism 03/23/2010  . Pacemaker   . Paroxysmal atrial fibrillation ([place]) 03/23/2010   CHA2DS2VASC score 5     Past Surgical History:  Procedure Laterality Date  . CARDIAC CATHETERIZATION N/A 12/26/2015     Procedure: Left Heart Cath and Coronary Angiography;  Surgeon: Peter M [name], MD;  Location: [facility];  Service: Cardiovascular;  Laterality: N/A;  . CARDIOVERSION  05/2015  . CHOLECYSTECTOMY  2012  . CORONARY ANGIOPLASTY WITH STENT PLACEMENT  1990  . HERNIA REPAIR    . PACEMAKER GENERATOR CHANGE  [id number], 2016  . PACEMAKER INSERTION  1991    Current Medications: Prior to Admission medications   Medication Sig Start Date End Date Taking? Authorizing Provider  atorvastatin (LIPITOR) 10 MG tablet Take 1 tablet (10 mg total) by mouth daily. 05/07/16   Camnitz, Ocie Doyne, MD  carvedilol (COREG) 12.5 MG tablet Take 12.5 mg by mouth 2 (two) times daily with a meal.    [provider]  Choline Fenofibrate (FENOFIBRIC ACID) 45 MG CPDR Take 45 mg by mouth daily.    [provider]  dronedarone (MULTAQ) 400 MG tablet Take 400 mg by mouth 2 (two) times daily with a meal.    [provider]  esomeprazole (NEXIUM) 40 MG capsule Take 40 mg by mouth daily at 12 noon.    [provider]  glipiZIDE (GLUCOTROL) 10 MG tablet Take 10 mg by mouth daily before breakfast.    [provider]  levothyroxine (SYNTHROID, LEVOTHROID) 112 MCG tablet Take 1 tablet (112 mcg total) by mouth daily before breakfast. 03/11/17   Verlee Monte, MD  linagliptin (TRADJENTA) 5 MG TABS tablet Take 5 mg by mouth daily.    [provider]  losartan (COZAAR) 100 MG tablet Take 100 mg by mouth daily.  [provider]  metFORMIN (GLUCOPHAGE) 500 MG tablet Take 500 mg by mouth every evening.     [provider]  rivaroxaban (XARELTO) 20 MG TABS tablet Take 20 mg by mouth daily with supper.    [provider]    Allergies:   Patient has no known allergies.   Social History   Social History  . Marital status: Widowed    Spouse name: N/A  . Number of children: N/A  . Years of education: N/A   Social History Main Topics  . Smoking  status: Current Every Day Smoker    Packs/day: 0.50  . Smokeless tobacco: Never Used  . Alcohol use No  . Drug use: No  . Sexual activity: Not Asked   Other Topics Concern  . None   Social History Narrative  . None     Family History:  The patient's family history includes Cirrhosis in her brother; Heart disease in her father and mother.   ROS:   Please see the history of present illness.    ROS All other systems reviewed and are negative.   PHYSICAL EXAM:   VS:  BP (!) 108/50   Pulse 70   Ht 5\' 2"  (1.575 m)   Wt 136 lb 12.8 oz (62.1 kg)   LMP  (LMP Unknown)   BMI 25.02 kg/m    GEN: Well nourished, well developed, in no acute distress  HEENT: normal  Neck: no JVD, carotid bruits, or masses Cardiac: RRR; no murmurs, rubs, or gallops,no edema  Respiratory:  clear to auscultation bilaterally, normal work of breathing GI: soft, nontender, nondistended, + BS MS: no deformity or atrophy  Skin: warm and dry, no rash Neuro:  Alert and Oriented x 3, Strength and sensation are intact Psych: euthymic mood, full affect  Wt Readings from Last 3 Encounters:  03/19/17 136 lb 12.8 oz (62.1 kg)  03/11/17 136 lb 3.2 oz (61.8 kg)  12/03/16 139 lb (63 kg)      Studies/Labs Reviewed:   EKG:  EKG is not  ordered today.    Recent Labs: 04/27/2016: Magnesium 2.0 03/10/2017: BUN 19; Creatinine, Ser 0.95; Hemoglobin 10.8; Platelets 175; Potassium 3.8; Sodium 138; TSH 0.053   Lipid Panel    Component Value Date/Time   CHOL 132 03/10/2017 0457   TRIG 61 03/10/2017 0457   HDL 56 03/10/2017 0457   CHOLHDL 2.4 03/10/2017 0457   VLDL 12 03/10/2017 0457   LDLCALC 64 03/10/2017 0457    Additional studies/ records that were reviewed today include:   As above   ASSESSMENT & PLAN:    1. CAD s/p previous coronary stenting including circumflex and right coronary arteries - No exertional dyspnea or chest pain. She did not require any nitroglycerin in many years. On aspirin due to  need of anticoagulation. Continue statin, Coreg and losartan.  2. HTN - Stable and well controlled on current regimen.  3. HLD - 03/10/2017: Cholesterol 132; HDL 56; LDL Cholesterol 64; Triglycerides 61; VLDL 12  - Continue Lipitor 10 mg daily.  4. Tachy Brady syndrome s/p PPM - follwed by EP  5. PAF - maintaining sinus rhythm on exam. Continue BB, maltaq and Xarelto for anticoagulation.   6. Tobacco abuse - Advised cessation.  7. Hypothyrodism - meds adjusted during admission. Per PCP  8. DM - Per PCP.    Medication Adjustments/Labs and Tests Ordered: Current medicines are reviewed at length with the patient today.  Concerns regarding medicines are outlined above.  Medication changes, Labs and Tests ordered today are listed in the Patient Instructions below. Patient Instructions  Medication Instructions:  Your physician recommends that you continue on your current medications as directed. Please refer to the Current Medication list given to you today.  Labwork: NONE  Testing/Procedures: NONE  Follow-Up: Your physician wants you to follow-up in: 3 to 4 months with Dr. Tamala Julian.  If you need a refill on your cardiac medications before your next appointment, please call your pharmacy.       Jarrett Soho, PA  03/19/2017 10:00 AM    [facility] [address], [place], [place]  [address] Phone: [phone]; Fax: [phone]

## 2017-03-19 ENCOUNTER — Encounter (INDEPENDENT_AMBULATORY_CARE_PROVIDER_SITE_OTHER): Payer: Self-pay

## 2017-03-19 ENCOUNTER — Ambulatory Visit (INDEPENDENT_AMBULATORY_CARE_PROVIDER_SITE_OTHER): Payer: Medicare Other | Admitting: Physician Assistant

## 2017-03-19 ENCOUNTER — Encounter: Payer: Self-pay | Admitting: Physician Assistant

## 2017-03-19 VITALS — BP 108/50 | HR 70 | Ht 62.0 in | Wt 136.8 lb

## 2017-03-19 DIAGNOSIS — E119 Type 2 diabetes mellitus without complications: Secondary | ICD-10-CM

## 2017-03-19 DIAGNOSIS — I25119 Atherosclerotic heart disease of native coronary artery with unspecified angina pectoris: Secondary | ICD-10-CM | POA: Diagnosis not present

## 2017-03-19 DIAGNOSIS — I480 Paroxysmal atrial fibrillation: Secondary | ICD-10-CM

## 2017-03-19 DIAGNOSIS — Z950 Presence of cardiac pacemaker: Secondary | ICD-10-CM | POA: Diagnosis not present

## 2017-03-19 DIAGNOSIS — E785 Hyperlipidemia, unspecified: Secondary | ICD-10-CM | POA: Diagnosis not present

## 2017-03-19 DIAGNOSIS — Z720 Tobacco use: Secondary | ICD-10-CM

## 2017-03-19 DIAGNOSIS — I10 Essential (primary) hypertension: Secondary | ICD-10-CM | POA: Diagnosis not present

## 2017-03-19 DIAGNOSIS — I495 Sick sinus syndrome: Secondary | ICD-10-CM

## 2017-03-19 MED ORDER — NITROGLYCERIN 0.4 MG SL SUBL: 0.4000 mg | SUBLINGUAL_TABLET | SUBLINGUAL | 3 refills | Status: DC | PRN

## 2017-03-19 NOTE — Patient Instructions (Addendum)
Medication Instructions:  Your physician has recommended you make the following change in your medication:   Take 1 Nitroglycerin (NTG), under your tongue, while sitting. If no relief of pain may repeat NTG, one tab every 5 minutes up to 3 tablets total over 15 minutes. If no relief CALL 911. If you have dizziness/lightheadness while taking NTG, stop taking and call 911.  Labwork: NONE  Testing/Procedures: NONE  Follow-Up: Your physician wants you to follow-up in: 3 to 4 months with Dr. Tamala Julian.  If you need a refill on your cardiac medications before your next appointment, please call your pharmacy.   Nitroglycerin sublingual tablets What is this medicine? NITROGLYCERIN (nye troe GLI ser in) is a type of vasodilator. It relaxes blood vessels, increasing the blood and oxygen supply to your heart. This medicine is used to relieve chest pain caused by angina. It is also used to prevent chest pain before activities like climbing stairs, going outdoors in cold weather, or sexual activity. This medicine may be used for other purposes; ask your health care provider or pharmacist if you have questions. COMMON BRAND NAME(S): Nitroquick, Nitrostat, Nitrotab What should I tell my health care provider before I take this medicine? They need to know if you have any of these conditions: -anemia -head injury, recent stroke, or bleeding in the brain -liver disease -previous heart attack -an unusual or allergic reaction to nitroglycerin, other medicines, foods, dyes, or preservatives -pregnant or trying to get pregnant -breast-feeding How should I use this medicine? Take this medicine by mouth as needed. At the first sign of an angina attack (chest pain or tightness) place one tablet under your tongue. You can also take this medicine 5 to 10 minutes before an event likely to produce chest pain. Follow the directions on the prescription label. Let the tablet dissolve under the tongue. Do not  swallow whole. Replace the dose if you accidentally swallow it. It will help if your mouth is not dry. Saliva around the tablet will help it to dissolve more quickly. Do not eat or drink, smoke or chew tobacco while a tablet is dissolving. If you are not better within 5 minutes after taking ONE dose of nitroglycerin, call 9-1-1 immediately to seek emergency medical care. Do not take more than 3 nitroglycerin tablets over 15 minutes. If you take this medicine often to relieve symptoms of angina, your doctor or health care professional may provide you with different instructions to manage your symptoms. If symptoms do not go away after following these instructions, it is important to call 9-1-1 immediately. Do not take more than 3 nitroglycerin tablets over 15 minutes. Talk to your pediatrician regarding the use of this medicine in children. Special care may be needed. Overdosage: If you think you have taken too much of this medicine contact a poison control center or emergency room at once. NOTE: This medicine is only for you. Do not share this medicine with others. What if I miss a dose? This does not apply. This medicine is only used as needed. What may interact with this medicine? Do not take this medicine with any of the following medications: -certain migraine medicines like ergotamine and dihydroergotamine (DHE) -medicines used to treat erectile dysfunction like sildenafil, tadalafil, and vardenafil -riociguat This medicine may also interact with the following medications: -alteplase -aspirin -heparin -medicines for high blood pressure -medicines for mental depression -other medicines used to treat angina -phenothiazines like chlorpromazine, mesoridazine, prochlorperazine, thioridazine This list may not describe all possible interactions. Give  your health care provider a list of all the medicines, herbs, non-prescription drugs, or dietary supplements you use. Also tell them if you smoke,  drink alcohol, or use illegal drugs. Some items may interact with your medicine. What should I watch for while using this medicine? Tell your doctor or health care professional if you feel your medicine is no longer working. Keep this medicine with you at all times. Sit or lie down when you take your medicine to prevent falling if you feel dizzy or faint after using it. Try to remain calm. This will help you to feel better faster. If you feel dizzy, take several deep breaths and lie down with your feet propped up, or bend forward with your head resting between your knees. You may get drowsy or dizzy. Do not drive, use machinery, or do anything that needs mental alertness until you know how this drug affects you. Do not stand or sit up quickly, especially if you are an older patient. This reduces the risk of dizzy or fainting spells. Alcohol can make you more drowsy and dizzy. Avoid alcoholic drinks. Do not treat yourself for coughs, colds, or pain while you are taking this medicine without asking your doctor or health care professional for advice. Some ingredients may increase your blood pressure. What side effects may I notice from receiving this medicine? Side effects that you should report to your doctor or health care professional as soon as possible: -blurred vision -dry mouth -skin rash -sweating -the feeling of extreme pressure in the head -unusually weak or tired Side effects that usually do not require medical attention (report to your doctor or health care professional if they continue or are bothersome): -flushing of the face or neck -headache -irregular heartbeat, palpitations -nausea, vomiting This list may not describe all possible side effects. Call your doctor for medical advice about side effects. You may report side effects to FDA at 1-800-FDA-1088. Where should I keep my medicine? Keep out of the reach of children. Store at room temperature between 20 and 25 degrees C (68 and  77 degrees F). Store in [facility] [employer] [facility]. Protect from light and moisture. Keep tightly closed. Throw away any unused medicine after the expiration date. NOTE: This sheet is a summary. It may not cover all possible information. If you have questions about this medicine, talk to your doctor, pharmacist, or health care provider.  2018 Elsevier/Gold Standard (2013-07-16 17:57:36)

## 2017-04-05 ENCOUNTER — Ambulatory Visit: Payer: Medicare Other | Admitting: Family Medicine

## 2017-04-05 DIAGNOSIS — Z0289 Encounter for other administrative examinations: Secondary | ICD-10-CM

## 2017-04-24 ENCOUNTER — Other Ambulatory Visit: Payer: Self-pay | Admitting: Cardiology

## 2017-05-01 ENCOUNTER — Encounter (HOSPITAL_COMMUNITY): Payer: Self-pay | Admitting: Emergency Medicine

## 2017-05-01 ENCOUNTER — Ambulatory Visit (HOSPITAL_COMMUNITY)
Admission: EM | Admit: 2017-05-01 | Discharge: 2017-05-01 | Disposition: A | Discharge status: Home / Self Care | Payer: Medicare Other | Attending: Family Medicine | Admitting: Family Medicine

## 2017-05-01 DIAGNOSIS — M79645 Pain in left finger(s): Secondary | ICD-10-CM | POA: Diagnosis not present

## 2017-05-01 DIAGNOSIS — L989 Disorder of the skin and subcutaneous tissue, unspecified: Secondary | ICD-10-CM

## 2017-05-01 DIAGNOSIS — E119 Type 2 diabetes mellitus without complications: Secondary | ICD-10-CM

## 2017-05-01 DIAGNOSIS — Z760 Encounter for issue of repeat prescription: Secondary | ICD-10-CM

## 2017-05-01 MED ORDER — METFORMIN HCL 500 MG PO TABS: 500.0000 mg | ORAL_TABLET | Freq: Every evening | ORAL | 0 refills | Status: DC

## 2017-05-01 MED ORDER — RIVAROXABAN 20 MG PO TABS: 20.0000 mg | ORAL_TABLET | Freq: Every day | ORAL | 0 refills | Status: DC

## 2017-05-01 NOTE — ED Triage Notes (Signed)
PT needs refill of Metformin ER 500mg  (last took Saturday.) She also needs a refill of Xarelto 20 mg tablet (last took this AM).   PT also has a knot on left fifth finger.

## 2017-05-01 NOTE — ED Provider Notes (Signed)
CSN: [id number]     Arrival date & time 05/01/17  1203 History   None    Chief Complaint  Patient presents with  . Medication Refill  . Hand Pain   (Consider location/radiation/quality/duration/timing/severity/associated sxs/prior Treatment) 82 year old female with history of CAD, COPD, diabetes, hypertension, hypothyroidism, paroxysmal A. fib, pacemaker, comes in for 1 day history of left fifth finger knot, as well as medication refill. States she noticed a knot on medial side of her fifth DIP joint yesterday, which is slightly tender to palpation. Denies increase in size today, denies injury, surrounding erythema, increased warmth. Denies fever, chills, night sweats. Denies decrease in range of motion due to the knot. Patient also requesting medication refill of metformin and Xarelto. She states she is recently changing PCP, and medication ran out before she could see her new primary care doctor. She has an appointment on August 10 to follow-up. She took her last dose of Xarelto today, and took her last dose of metformin 2 days ago. Her blood sugar this morning was 84. Denies chest pain, abdominal pain, nausea, vomiting, weakness, dizziness. She admits to shortness of breath and wheezing, which is baseline for her with COPD. Denies blood in stool, blood in urine.      Past Medical History:  Diagnosis Date  . CAD (coronary artery disease), native coronary artery    PTCA of distal right coronary artery 1997 Cypher stents to circumflex 2005 Cardiac cath in 2011 with patent stent to circumflex and moderate disease elsewhere treated medically   . Cardiac pacemaker in situ 12/24/2015   Original implant reportedly in 1991 for tachybradycardia syndrome, generator change in 1999, 2004 and evidently again in 2016 in [place]   . COPD (chronic obstructive pulmonary disease) ([place])   . Diabetes mellitus without complication ([place])   . Hypertension   . Hypothyroidism 03/23/2010  . Pacemaker   .  Paroxysmal atrial fibrillation ([place]) 03/23/2010   CHA2DS2VASC score 5    Past Surgical History:  Procedure Laterality Date  . CARDIAC CATHETERIZATION N/A 12/26/2015   Procedure: Left Heart Cath and Coronary Angiography;  Surgeon: Peter M [name], MD;  Location: [facility];  Service: Cardiovascular;  Laterality: N/A;  . CARDIOVERSION  05/2015  . CHOLECYSTECTOMY  2012  . CORONARY ANGIOPLASTY WITH STENT PLACEMENT  1990  . HERNIA REPAIR    . PACEMAKER GENERATOR CHANGE  [id number], 2016  . PACEMAKER INSERTION  1991   Family History  Problem Relation Age of Onset  . Heart disease Father   . Heart disease Mother   . Cirrhosis Brother   . Colon cancer Neg Hx   . Stomach cancer Neg Hx    Social History  Substance Use Topics  . Smoking status: Current Every Day Smoker    Packs/day: 0.50  . Smokeless tobacco: Never Used  . Alcohol use No   OB History    No data available     Review of Systems  Reason unable to perform ROS: See HPI as above.    Allergies  Patient has no known allergies.  Home Medications   Prior to Admission medications   Medication Sig Start Date End Date Taking? Authorizing Provider  atorvastatin (LIPITOR) 10 MG tablet TAKE 1 TABLET BY MOUTH DAILY 04/24/17   Camnitz, Ocie Doyne, MD  carvedilol (COREG) 12.5 MG tablet Take 12.5 mg by mouth 2 (two) times daily with a meal.    [provider]  Choline Fenofibrate (FENOFIBRIC ACID) 45 MG CPDR Take 45 mg  by mouth daily.    [provider]  dronedarone (MULTAQ) 400 MG tablet Take 400 mg by mouth 2 (two) times daily with a meal.    [provider]  esomeprazole (NEXIUM) 40 MG capsule Take 40 mg by mouth daily at 12 noon.    [provider]  glipiZIDE (GLUCOTROL) 10 MG tablet Take 10 mg by mouth daily before breakfast.    [provider]  levothyroxine (SYNTHROID, LEVOTHROID) 112 MCG tablet Take 1 tablet (112 mcg total) by mouth daily before breakfast. 03/11/17   Verlee Monte, MD  linagliptin (TRADJENTA) 5 MG TABS tablet Take 5 mg by mouth daily.    [provider]  losartan (COZAAR) 100 MG tablet Take 100 mg by mouth daily.    [provider]  metFORMIN (GLUCOPHAGE) 500 MG tablet Take 1 tablet (500 mg total) by mouth every evening. 05/01/17 05/31/17  Tasia Catchings, Amy V, PA-C  nitroGLYCERIN (NITROSTAT) 0.4 MG SL tablet Place 1 tablet (0.4 mg total) under the tongue every 5 (five) minutes as needed for chest pain. 03/19/17 06/17/17  Leanor Kail, PA  rivaroxaban (XARELTO) 20 MG TABS tablet Take 1 tablet (20 mg total) by mouth daily with supper. 05/01/17   Ok Edwards, PA-C   Meds Ordered and Administered this Visit  Medications - No data to display  BP 126/66   Pulse 70   Temp 98 F (36.7 C) (Oral)   Resp 16   Ht 5\' 1"  (1.549 m)   Wt 140 lb (63.5 kg)   LMP  (LMP Unknown)   SpO2 94%   BMI 26.45 kg/m  No data found.   Physical Exam  Constitutional: She is oriented to person, place, and time. She appears well-developed and well-nourished. No distress.  HENT:  Head: Normocephalic and atraumatic.  Eyes: Pupils are equal, round, and reactive to light. Conjunctivae are normal.  Cardiovascular: Normal rate, regular rhythm and normal heart sounds.  Exam reveals no gallop and no friction rub.   No murmur heard. Pulmonary/Chest: Effort normal. No respiratory distress. She has no decreased breath sounds. She has no wheezes. She has no rhonchi. She has no rales.  Musculoskeletal:  Small knot on left medial 5th DIP. Mild tenderness on palpation, no surrounding erythema, increased warmth. No palpable induration felt. Full ROM of fingers. Strength normal and equal bilaterally. Sensation intact and equal. Cap refill < 2s.   Neurological: She is alert and oriented to person, place, and time.  Skin: Skin is warm and dry.  Psychiatric: She has a normal mood and affect. Her behavior is normal. Judgment normal.    Urgent Care Course     Procedures  (including critical care time)  Labs Review Labs Reviewed - No data to display  Imaging Review No results found.      MDM   1. Medication refill   2. Finger lesion    1. Discussed with patient possible causes of finger lesion, given without signs of infection or abscess, patient to monitor for now. Reevaluate at PCP visit scheduled on 8/10. If experiencing worsening of symptoms, increase in size of lesion, erythema/increased warmth, fever, chills, to follow up for reevaluation. 2. Given patient with cohesive story, documentation of medication with correct dosage, will refill Metformin and Xarelto for 30 days. Patient to follow up as scheduled for PCP visit for further refills needed.     Ok Edwards, PA-C 05/01/17 1314

## 2017-05-01 NOTE — Discharge Instructions (Signed)
Monitor finger knot for worsening of symptoms, no signs of infection today. Follow up as scheduled with PCP in 1 week for reevaluation. Medication refill of metformin and xarelto, follow up with PCP for further refills.

## 2017-05-06 ENCOUNTER — Ambulatory Visit (INDEPENDENT_AMBULATORY_CARE_PROVIDER_SITE_OTHER): Payer: Medicare Other | Admitting: *Deleted

## 2017-05-06 DIAGNOSIS — I495 Sick sinus syndrome: Secondary | ICD-10-CM

## 2017-05-07 NOTE — Progress Notes (Signed)
Remote pacemaker transmission.   

## 2017-05-08 ENCOUNTER — Encounter: Payer: Self-pay | Admitting: Cardiology

## 2017-05-10 ENCOUNTER — Ambulatory Visit (INDEPENDENT_AMBULATORY_CARE_PROVIDER_SITE_OTHER): Payer: Medicare Other | Admitting: Family Medicine

## 2017-05-10 ENCOUNTER — Encounter: Payer: Self-pay | Admitting: Family Medicine

## 2017-05-10 VITALS — BP 130/68 | HR 78 | Resp 12 | Ht 61.0 in | Wt 138.2 lb

## 2017-05-10 DIAGNOSIS — E890 Postprocedural hypothyroidism: Secondary | ICD-10-CM

## 2017-05-10 DIAGNOSIS — F17200 Nicotine dependence, unspecified, uncomplicated: Secondary | ICD-10-CM | POA: Diagnosis not present

## 2017-05-10 DIAGNOSIS — I119 Hypertensive heart disease without heart failure: Secondary | ICD-10-CM | POA: Diagnosis not present

## 2017-05-10 DIAGNOSIS — J42 Unspecified chronic bronchitis: Secondary | ICD-10-CM | POA: Diagnosis not present

## 2017-05-10 DIAGNOSIS — I480 Paroxysmal atrial fibrillation: Secondary | ICD-10-CM | POA: Diagnosis not present

## 2017-05-10 DIAGNOSIS — M25542 Pain in joints of left hand: Secondary | ICD-10-CM | POA: Diagnosis not present

## 2017-05-10 DIAGNOSIS — E119 Type 2 diabetes mellitus without complications: Secondary | ICD-10-CM

## 2017-05-10 LAB — CBC
HEMATOCRIT: 36.3 % (ref 36.0–46.0)
Hemoglobin: 11.4 g/dL — ABNORMAL LOW (ref 12.0–15.0)
MCHC: 31.3 g/dL (ref 30.0–36.0)
MCV: 89.3 fl (ref 78.0–100.0)
Platelets: 196 10*3/uL (ref 150.0–400.0)
RBC: 4.07 Mil/uL (ref 3.87–5.11)
RDW: 15.9 % — AB (ref 11.5–15.5)
WBC: 5.6 10*3/uL (ref 4.0–10.5)

## 2017-05-10 LAB — BASIC METABOLIC PANEL
BUN: 13 mg/dL (ref 6–23)
CALCIUM: 9.4 mg/dL (ref 8.4–10.5)
CO2: 28 meq/L (ref 19–32)
Chloride: 108 mEq/L (ref 96–112)
Creatinine, Ser: 0.93 mg/dL (ref 0.40–1.20)
GFR: 74.62 mL/min (ref >60.00)
GLUCOSE: 85 mg/dL (ref 70–99)
POTASSIUM: 3.8 meq/L (ref 3.5–5.1)
SODIUM: 142 meq/L (ref 135–145)

## 2017-05-10 LAB — T4, FREE: FREE T4: 1.19 ng/dL (ref 0.60–1.60)

## 2017-05-10 LAB — TSH: TSH: 0.35 u[IU]/mL (ref 0.35–4.50)

## 2017-05-10 LAB — HEMOGLOBIN A1C: Hgb A1c MFr Bld: 6.2 % (ref 4.6–6.5)

## 2017-05-10 MED ORDER — ALBUTEROL SULFATE HFA 108 (90 BASE) MCG/ACT IN AERS: 2.0000 | INHALATION_SPRAY | Freq: Four times a day (QID) | RESPIRATORY_TRACT | 1 refills | Status: DC | PRN

## 2017-05-10 MED ORDER — NICOTINE 14 MG/24HR TD PT24: 14.0000 mg | MEDICATED_PATCH | Freq: Every day | TRANSDERMAL | 0 refills | Status: DC

## 2017-05-10 NOTE — Patient Instructions (Addendum)
A few things to remember from today's visit:   Paroxysmal atrial fibrillation ([name]) - Plan: CBC, Basic metabolic panel  Chronic bronchitis, unspecified chronic bronchitis type ([name]) - Plan: albuterol (PROVENTIL HFA;VENTOLIN HFA) 108 (90 Base) MCG/ACT inhaler  Postablative hypothyroidism - Plan: TSH, T4, free  Diabetes mellitus without complication ([name]) - Plan: Basic metabolic panel, Hemoglobin A1c, Fructosamine   Please be sure medication list is accurate. If a new problem present, please set up appointment sooner than planned today.

## 2017-05-10 NOTE — Progress Notes (Signed)
HPI:   [name] is a 82 y.o. female, who is here today to establish care.  Former PCP: Dr Aldean Baker Last preventive routine visit: She does not remember.  She moved from [place] in 11/2015.  Chronic medical problems: DM II,HTN,GERD,tobacco use disorder,hypothyroidism,HLD, and atrial fib s/p pacemaker placement.  HTN and DM II for "a while", 20+ years.  Diabetes Mellitus II:   Currently on Metformin 500 mg at night, Tradjenta 5 mg, and Glipizide 10 mg daily. Problem has been well controlled. Checking BS's : 80's-90's. Hypoglycemia: 60's a couple times.  She is tolerating medications well. She denies abdominal pain, nausea, vomiting, polydipsia, polyuria, or polyphagia. Denies numbness, tingling, or burning.   Lab Results  Component Value Date   CREATININE 0.95 03/10/2017   BUN 19 03/10/2017   NA 138 03/10/2017   K 3.8 03/10/2017   CL 106 03/10/2017   CO2 24 03/10/2017    Lab Results  Component Value Date   HGBA1C 5.9  02/06/2017   COPD: She is not on any inhaler. "Little" exertional dyspnea with some activities like prolonged walking, stable.  Today starting coughing and sneezing.She denies hemoptysis or increase sputum production.No sick contact or recent travel. She thinks symptoms may be related to allergies.   HTN:  She is on Cozaar 100 mg daily, Carvedilol 12.5 mg bid.  Follows with cardiology for atrial fib and CAD.  Denies headache, visual changes, chest pain, palpitation, claudication, focal weakness, or edema.  She lives with her daughter for now , she is trying to get her own place.  She was hospitalized recently, she had to cancel F/U appt.Hospitalized from 03/09/17 to 03/11/17. She also was in the ER on 05/01/17 c/o IP pain, 5th finger "knot" left DIP. Mild tenderness, no Hx of trauma and no changes in size since she noted it (04/30/17). + Stiffness.  No HH or PT needed after hospitalization and feels like she is at her baseline  now.  Hypothyroidism: S/P iodine radiation therapy for hyperthyroidism many years ago. Currently she is on Levothyroxine 112 mcg daily.  TSH 01/2017 0.6.  She has not noted tachycardia,tremor,abnormal wt loss,heat or cold intolerance.  Review of Systems  Constitutional: Negative for activity change, appetite change, fatigue and fever.  HENT: Positive for postnasal drip and sneezing. Negative for mouth sores, nosebleeds, sore throat and trouble swallowing.   Eyes: Negative for redness and visual disturbance.  Respiratory: Positive for cough and shortness of breath. Negative for wheezing.   Cardiovascular: Negative for chest pain, palpitations and leg swelling.  Gastrointestinal: Negative for abdominal pain, nausea and vomiting.       Negative for changes in bowel habits.  Endocrine: Negative for cold intolerance, heat intolerance, polydipsia, polyphagia and polyuria.  Genitourinary: Negative for decreased urine volume, dysuria and hematuria.  Musculoskeletal: Positive for arthralgias and back pain (Chronic, lower back,no radiated,exacerbated by prolonged walking.). Negative for gait problem and joint swelling.  Skin: Negative for rash.  Allergic/Immunologic: Positive for environmental allergies.  Neurological: Negative for syncope, weakness and headaches.  Hematological: Negative for adenopathy. Does not bruise/bleed easily.  Psychiatric/Behavioral: Negative for confusion. The patient is not nervous/anxious.       Current Outpatient Prescriptions on File Prior to Visit  Medication Sig Dispense Refill  . atorvastatin (LIPITOR) 10 MG tablet TAKE 1 TABLET BY MOUTH DAILY 90 tablet 2  . carvedilol (COREG) 12.5 MG tablet Take 12.5 mg by mouth 2 (two) times daily with a meal.    .  Choline Fenofibrate (FENOFIBRIC ACID) 45 MG CPDR Take 45 mg by mouth daily.    Marland Kitchen dronedarone (MULTAQ) 400 MG tablet Take 400 mg by mouth 2 (two) times daily with a meal.    . esomeprazole (NEXIUM) 40 MG capsule  Take 40 mg by mouth daily at 12 noon.    Marland Kitchen glipiZIDE (GLUCOTROL) 10 MG tablet Take 10 mg by mouth daily before breakfast.    . levothyroxine (SYNTHROID, LEVOTHROID) 112 MCG tablet Take 1 tablet (112 mcg total) by mouth daily before breakfast. 30 tablet 1  . linagliptin (TRADJENTA) 5 MG TABS tablet Take 5 mg by mouth daily.    Marland Kitchen losartan (COZAAR) 100 MG tablet Take 100 mg by mouth daily.    . metFORMIN (GLUCOPHAGE) 500 MG tablet Take 1 tablet (500 mg total) by mouth every evening. 30 tablet 0  . nitroGLYCERIN (NITROSTAT) 0.4 MG SL tablet Place 1 tablet (0.4 mg total) under the tongue every 5 (five) minutes as needed for chest pain. 25 tablet 3  . rivaroxaban (XARELTO) 20 MG TABS tablet Take 1 tablet (20 mg total) by mouth daily with supper. 30 tablet 0   No current facility-administered medications on file prior to visit.      Past Medical History:  Diagnosis Date  . CAD (coronary artery disease), native coronary artery    PTCA of distal right coronary artery 1997 Cypher stents to circumflex 2005 Cardiac cath in 2011 with patent stent to circumflex and moderate disease elsewhere treated medically   . Cardiac pacemaker in situ 12/24/2015   Original implant reportedly in 1991 for tachybradycardia syndrome, generator change in 1999, 2004 and evidently again in 2016 in [place]   . COPD (chronic obstructive pulmonary disease) ([place])   . Diabetes mellitus without complication ([place])   . Hypertension   . Hypothyroidism 03/23/2010  . Pacemaker   . Paroxysmal atrial fibrillation (HCC) 03/23/2010   CHA2DS2VASC score 5    No Known Allergies  Family History  Problem Relation Age of Onset  . Heart disease Father   . Heart disease Mother   . Cirrhosis Brother   . Colon cancer Neg Hx   . Stomach cancer Neg Hx     Social History   Social History  . Marital status: Widowed    Spouse name: N/A  . Number of children: N/A  . Years of education: N/A   Social History Main Topics  . Smoking  status: Current Every Day Smoker    Packs/day: 0.50  . Smokeless tobacco: Never Used  . Alcohol use No  . Drug use: No  . Sexual activity: Not Asked   Other Topics Concern  . None   Social History Narrative  . None    Vitals:   05/10/17 0940  BP: 130/68  Pulse: 78  Resp: 12  SpO2: 94%    Body mass index is 26.12 kg/m.  Physical Exam  Nursing note and vitals reviewed. Constitutional: She is oriented to person, place, and time. She appears well-developed and well-nourished. No distress.  HENT:  Head: Atraumatic.  Mouth/Throat: Oropharynx is clear and moist and mucous membranes are normal. She has dentures.  Eyes: Pupils are equal, round, and reactive to light. Conjunctivae and EOM are normal.  Neck: No tracheal deviation present. No thyroid mass and no thyromegaly present.  Cardiovascular: Normal rate and regular rhythm.   No murmur heard. DP pulses present bilateral.  Respiratory: Effort normal and breath sounds normal. No respiratory distress.  Pacemaker palpated right upper  chest.  GI: Soft. She exhibits no mass. There is no hepatomegaly. There is no tenderness.  Musculoskeletal: She exhibits no edema or tenderness.  Left DIP joint with not tender nodule,soft,non erythematous, mild limitation of full extension.  Lymphadenopathy:    She has no cervical adenopathy.  Neurological: She is alert and oriented to person, place, and time. She has normal strength. Coordination normal.  Skin: Skin is warm. No rash noted. No erythema.  Psychiatric: She has a normal mood and affect.  Well groomed, good eye contact.    ASSESSMENT AND PLAN:   [name] was seen today for establish care.  Diagnoses and all orders for this visit:  Lab Results  Component Value Date   HGBA1C 6.2 05/10/2017   Lab Results  Component Value Date   CREATININE 0.93 05/10/2017   BUN 13 05/10/2017   NA 142 05/10/2017   K 3.8 05/10/2017   CL 108 05/10/2017   CO2 28 05/10/2017   Lab Results    Component Value Date   WBC 5.6 05/10/2017   HGB 11.4 (L) 05/10/2017   HCT 36.3 05/10/2017   MCV 89.3 05/10/2017   PLT 196.0 05/10/2017    Lab Results  Component Value Date   TSH 0.35 05/10/2017    Diabetes mellitus without complication ([place])  Has been well controlled. No changes in current management, will consider stopping Glipizide of HgA1C <6.5. Regular exercise and healthy diet with avoidance of added sugar food intake is an important part of treatment and recommended. Annual eye exam, periodic dental and foot care recommended. F/U in 4 months  -     Basic metabolic panel -     Hemoglobin A1c -     Fructosamine  Paroxysmal atrial fibrillation (HCC)  Rhythm and rate controlled. Asymptomatic. Continue following with cardiologists.  -     CBC -     Basic metabolic panel  Chronic bronchitis, unspecified chronic bronchitis type (HCC)  Smoking cessation encouraged. Albuterol inh as needed,some side effects discussed. F/U in 4 months,before if needed.  -     albuterol (PROVENTIL HFA;VENTOLIN HFA) 108 (90 Base) MCG/ACT inhaler; Inhale 2 puffs into the lungs every 6 (six) hours as needed for wheezing or shortness of breath.  Postablative hypothyroidism  No changes in current management, will follow labs done today and will give further recommendations accordingly.  -     TSH -     T4, free  Arthralgia of left hand  Most likely OA, ? Heberden's node. We will continue monitoring.  Tobacco use disorder  Adverse effects of tobacco use discussed. She would like to quit smoking. Nicotin helped after hospitalization ,smoking  < 10 cig. Some side effects discussed.  -     nicotine (NICODERM CQ - DOSED IN MG/24 HOURS) 14 mg/24hr patch; Place 1 patch (14 mg total) onto the skin daily.  Hypertension with heart disease  Adequately controlled. No changes in current management. DASH-low salt diet recommended. Eye exam recommended annually. F/U in 4 months, before  if needed.  -     Basic metabolic panel      Devin Ganaway G. [name], MD  [facility]. [place] office.

## 2017-05-13 LAB — FRUCTOSAMINE: FRUCTOSAMINE: 213 umol/L (ref 190–270)

## 2017-05-17 ENCOUNTER — Telehealth: Payer: Self-pay | Admitting: Family Medicine

## 2017-05-17 NOTE — Telephone Encounter (Signed)
Pt would like to see if she should be stopping Rx glipizide Dr. [name] had made mention about her stopping the Rx just wanted to know before she gets it refilled.  If pt is not home you can leave the msg with pts daughter Nelson Chimes).

## 2017-05-17 NOTE — Telephone Encounter (Signed)
Patient can discontinue Glipizide per Dr. Doug Sou result note.

## 2017-05-24 LAB — CUP PACEART REMOTE DEVICE CHECK
Battery Remaining Longevity: 54 mo
Date Time Interrogation Session: 20180806061000
Implantable Lead Implant Date: 20160713
Implantable Lead Location: 753859
Implantable Lead Location: 753860
Implantable Lead Serial Number: 29813363
Implantable Pulse Generator Implant Date: 20150130
Lead Channel Pacing Threshold Amplitude: 0.7 V
Lead Channel Pacing Threshold Amplitude: 1 V
Lead Channel Pacing Threshold Pulse Width: 0.4 ms
Lead Channel Setting Pacing Amplitude: 2 V
Lead Channel Setting Sensing Sensitivity: 2 mV
MDC IDC LEAD IMPLANT DT: 19990610
MDC IDC MSMT BATTERY REMAINING PERCENTAGE: 87 %
MDC IDC MSMT LEADCHNL RA IMPEDANCE VALUE: 350 Ohm
MDC IDC MSMT LEADCHNL RA PACING THRESHOLD PULSEWIDTH: 0.5 ms
MDC IDC MSMT LEADCHNL RV IMPEDANCE VALUE: 620 Ohm
MDC IDC SET LEADCHNL RV PACING AMPLITUDE: 1.3 V
MDC IDC SET LEADCHNL RV PACING PULSEWIDTH: 0.4 ms
MDC IDC STAT BRADY RA PERCENT PACED: 100 %
MDC IDC STAT BRADY RV PERCENT PACED: 5 %
Pulse Gen Serial Number: 393477

## 2017-05-30 ENCOUNTER — Other Ambulatory Visit: Payer: Self-pay | Admitting: Family Medicine

## 2017-06-01 ENCOUNTER — Other Ambulatory Visit: Payer: Self-pay | Admitting: Family Medicine

## 2017-06-05 NOTE — Telephone Encounter (Signed)
This medication is filled by her cardiologists, so please ask pharmacists to send refill request to her cardiologists' office. If she needs it now, we can send a 30 days supply while pharmacists send request.  Thanks, BJ

## 2017-06-08 ENCOUNTER — Other Ambulatory Visit: Payer: Self-pay | Admitting: Family Medicine

## 2017-06-18 ENCOUNTER — Other Ambulatory Visit: Payer: Self-pay | Admitting: Family Medicine

## 2017-06-18 NOTE — Progress Notes (Signed)
Cardiology Office Note    Date:  06/19/2017   ID:  [name], DOB [birth year], MRN [id number]  PCP:  [name], Robin G, MD  Cardiologist: Robin Grooms, MD   Chief Complaint  Patient presents with  . Coronary Artery Disease    History of Present Illness:  [name] is a 82 y.o. female with tachycardia-bradycardia syndrome, permanent pacemaker, coronary artery disease with prior right coronary drug-eluting stent as well as circumflex cipher stent 2005. History of COPD, continue smoking, diabetes, hypertension, and paroxysmal atrial fibrillation. Currently on anticoagulation and Multaq related to atrial fib  Overall, [name] complains of abdominal discomfort after eating. She has frequent diarrhea after eating. There is also associated bloating. She denies chest discomfort. She continues to smoke cigarettes. No orthopnea, PND, lower extremity swelling. No recurrent episodes of atrial fibrillation.  Because of recurrent atrial fibrillation she has been seen by electrophysiology, Dr. Curt [name], who is treating with Multaq. She remains on Xarelto. No bleeding on the medication. States that she did not have abdominal discomfort until her antiarrhythmic was started although her response is somewhat vague.  Past Medical History:  Diagnosis Date  . CAD (coronary artery disease), native coronary artery    PTCA of distal right coronary artery 1997 Cypher stents to circumflex 2005 Cardiac cath in 2011 with patent stent to circumflex and moderate disease elsewhere treated medically   . Cardiac pacemaker in situ 12/24/2015   Original implant reportedly in 1991 for tachybradycardia syndrome, generator change in 1999, 2004 and evidently again in 2016 in [place]   . COPD (chronic obstructive pulmonary disease) ([place])   . Diabetes mellitus without complication ([place])   . Hypertension   . Hypothyroidism 03/23/2010  . Pacemaker   . Paroxysmal atrial fibrillation ([place]) 03/23/2010   CHA2DS2VASC score 5       Past Surgical History:  Procedure Laterality Date  . CARDIAC CATHETERIZATION N/A 12/26/2015   Procedure: Left Heart Cath and Coronary Angiography;  Surgeon: Robin M [name], MD;  Location: [facility];  Service: Cardiovascular;  Laterality: N/A;  . CARDIOVERSION  05/2015  . CHOLECYSTECTOMY  2012  . CORONARY ANGIOPLASTY WITH STENT PLACEMENT  1990  . HERNIA REPAIR    . PACEMAKER GENERATOR CHANGE  [id number], 2016  . PACEMAKER INSERTION  1991    Current Medications: Outpatient Medications Prior to Visit  Medication Sig Dispense Refill  . albuterol (PROVENTIL HFA;VENTOLIN HFA) 108 (90 Base) MCG/ACT inhaler Inhale 2 puffs into the lungs every 6 (six) hours as needed for wheezing or shortness of breath. 1 Inhaler 1  . atorvastatin (LIPITOR) 10 MG tablet TAKE 1 TABLET BY MOUTH DAILY 90 tablet 2  . carvedilol (COREG) 12.5 MG tablet Take 12.5 mg by mouth 2 (two) times daily with a meal.    . Choline Fenofibrate (FENOFIBRIC ACID) 45 MG CPDR Take 45 mg by mouth daily.    Marland Kitchen dronedarone (MULTAQ) 400 MG tablet Take 400 mg by mouth 2 (two) times daily with a meal.    . esomeprazole (NEXIUM) 40 MG capsule TAKE ONE CAPSULE EVERY DAY 90 capsule 1  . levothyroxine (SYNTHROID, LEVOTHROID) 112 MCG tablet TAKE 1 TABLET (112 MCG TOTAL) BY MOUTH DAILY BEFORE BREAKFAST. 30 tablet 3  . linagliptin (TRADJENTA) 5 MG TABS tablet Take 5 mg by mouth daily.    Marland Kitchen losartan (COZAAR) 100 MG tablet Take 100 mg by mouth daily.    . metFORMIN (GLUCOPHAGE) 500 MG tablet TAKE 1 TABLET (500 MG TOTAL) BY MOUTH  EVERY EVENING. 90 tablet 1  . nicotine (NICODERM CQ - DOSED IN MG/24 HOURS) 14 mg/24hr patch Place 1 patch (14 mg total) onto the skin daily. 60 patch 0  . XARELTO 20 MG TABS tablet TAKE 1 TABLET (20 MG TOTAL) BY MOUTH DAILY WITH SUPPER. 30 tablet 0  . glipiZIDE (GLUCOTROL) 10 MG tablet Take 10 mg by mouth daily before breakfast.    . nitroGLYCERIN (NITROSTAT) 0.4 MG SL tablet Place 1 tablet (0.4 mg total) under  the tongue every 5 (five) minutes as needed for chest pain. 25 tablet 3   No facility-administered medications prior to visit.      Allergies:   Patient has no known allergies.   Social History   Social History  . Marital status: Widowed    Spouse name: N/A  . Number of children: N/A  . Years of education: N/A   Social History Main Topics  . Smoking status: Current Every Day Smoker    Packs/day: 0.50  . Smokeless tobacco: Never Used  . Alcohol use No  . Drug use: No  . Sexual activity: Not Asked   Other Topics Concern  . None   Social History Narrative  . None     Family History:  The patient's family history includes Cirrhosis in her brother; Heart disease in her father and mother.   ROS:   Please see the history of present illness.    Abdominal pain, diarrhea, rash, wheezing, constipation, nausea and vomiting, shortness of breath, decreased appetite but no weight loss.  All other systems reviewed and are negative.   PHYSICAL EXAM:   VS:  BP 140/66 (BP Location: Left Arm)   Pulse 71   Ht 5\' 2"  (1.575 m)   Wt 137 lb (62.1 kg)   LMP  (LMP Unknown)   BMI 25.06 kg/m    GEN: Appears older than stated age, well developed, in no acute distress. HEENT: normal  Neck: no JVD, carotid bruits, or masses Cardiac: RRR; no murmurs, rubs, or gallops,no edema  Respiratory:  clear to auscultation bilaterally, normal work of breathing GI: soft, nontender, nondistended, + BS MS: no deformity or atrophy  Skin: warm and dry, no rash Neuro:  Alert and Oriented x 3, Strength and sensation are intact Psych: euthymic mood, full affect  Wt Readings from Last 3 Encounters:  06/19/17 137 lb (62.1 kg)  05/10/17 138 lb 4 oz (62.7 kg)  05/01/17 140 lb (63.5 kg)      Studies/Labs Reviewed:   EKG:  EKG  Not performed. Last tracing demonstrated atrial pacing in June 2018.  Recent Labs: 05/10/2017: BUN 13; Creatinine, Ser 0.93; Hemoglobin 11.4; Platelets 196.0; Potassium 3.8; Sodium  142; TSH 0.35   Lipid Panel    Component Value Date/Time   CHOL 132 03/10/2017 0457   TRIG 61 03/10/2017 0457   HDL 56 03/10/2017 0457   CHOLHDL 2.4 03/10/2017 0457   VLDL 12 03/10/2017 0457   LDLCALC 64 03/10/2017 0457    Additional studies/ records that were reviewed today include:  2017 cardiac catheterization: Coronary Diagrams   Diagnostic Diagram        Abdominal CT scan with contrast February 2018: IMPRESSION: No acute finding. No cause of the presenting symptoms is identified. No sign of bowel pathology.   Aortic atherosclerosis without aneurysm. Though this examination was not done in the form of a CT angiogram, I do not suspect mesenteric vascular insufficiency.     ASSESSMENT:    1. Coronary artery disease  involving native coronary artery of native heart with angina pectoris (HCC)   2. Paroxysmal atrial fibrillation ([place])   3. Hypertension with heart disease   4. Chronic bronchitis, unspecified chronic bronchitis type ([place])   5. Cardiac pacemaker in situ   6. Diabetes mellitus without complication ([place])      PLAN:  In order of problems listed above:  1. Stable without angina. 2. Rhythm control with current medical therapy. 3. Well controlled. 4. Encouraged to stop smoking. 5. We'll be seen in device clinic and by Dr. Curt [name] today. Question whether her antiarrhythmic could be contributing to GI complaints. 6. Not addressed  Clinical follow-up in 9-12 months. No change in the current medical regimen. To see EP today. I reviewed the CT scan results in February and did not see evidence that intestinal ischemia could be causing her GI complaints.    Medication Adjustments/Labs and Tests Ordered: Current medicines are reviewed at length with the patient today.  Concerns regarding medicines are outlined above.  Medication changes, Labs and Tests ordered today are listed in the Patient Instructions below. Patient Instructions  Medication Instructions:   None  Labwork: None  Testing/Procedures: None  Follow-Up: Your physician wants you to follow-up in: 6 months with Dr. Tamala Julian.  You will receive a reminder letter in the mail two months in advance. If you don't receive a letter, please call our office to schedule the follow-up appointment.   Any Other Special Instructions Will Be Listed Below (If Applicable).     If you need a refill on your cardiac medications before your next appointment, please call your pharmacy.     Signed, Robin Grooms, MD  06/19/2017 10:34 AM    [facility] [place], [address], [place]  [address] Phone: [phone]; Fax: [phone]

## 2017-06-19 ENCOUNTER — Ambulatory Visit (INDEPENDENT_AMBULATORY_CARE_PROVIDER_SITE_OTHER): Payer: Medicare Other | Admitting: Cardiology

## 2017-06-19 ENCOUNTER — Encounter: Payer: Self-pay | Admitting: Cardiology

## 2017-06-19 ENCOUNTER — Ambulatory Visit (INDEPENDENT_AMBULATORY_CARE_PROVIDER_SITE_OTHER): Payer: Medicare Other | Admitting: Interventional Cardiology

## 2017-06-19 ENCOUNTER — Encounter: Payer: Self-pay | Admitting: Interventional Cardiology

## 2017-06-19 VITALS — BP 138/62 | HR 71 | Ht 62.0 in | Wt 137.0 lb

## 2017-06-19 VITALS — BP 140/66 | HR 71 | Ht 62.0 in | Wt 137.0 lb

## 2017-06-19 DIAGNOSIS — J42 Unspecified chronic bronchitis: Secondary | ICD-10-CM

## 2017-06-19 DIAGNOSIS — I480 Paroxysmal atrial fibrillation: Secondary | ICD-10-CM

## 2017-06-19 DIAGNOSIS — Z950 Presence of cardiac pacemaker: Secondary | ICD-10-CM | POA: Diagnosis not present

## 2017-06-19 DIAGNOSIS — E119 Type 2 diabetes mellitus without complications: Secondary | ICD-10-CM

## 2017-06-19 DIAGNOSIS — I10 Essential (primary) hypertension: Secondary | ICD-10-CM | POA: Diagnosis not present

## 2017-06-19 DIAGNOSIS — I2510 Atherosclerotic heart disease of native coronary artery without angina pectoris: Secondary | ICD-10-CM | POA: Diagnosis not present

## 2017-06-19 DIAGNOSIS — Z45018 Encounter for adjustment and management of other part of cardiac pacemaker: Secondary | ICD-10-CM

## 2017-06-19 DIAGNOSIS — I25119 Atherosclerotic heart disease of native coronary artery with unspecified angina pectoris: Secondary | ICD-10-CM | POA: Diagnosis not present

## 2017-06-19 DIAGNOSIS — I495 Sick sinus syndrome: Secondary | ICD-10-CM

## 2017-06-19 DIAGNOSIS — I119 Hypertensive heart disease without heart failure: Secondary | ICD-10-CM | POA: Diagnosis not present

## 2017-06-19 LAB — CUP PACEART INCLINIC DEVICE CHECK
Implantable Lead Implant Date: 19990610
Implantable Lead Implant Date: 20160713
Implantable Lead Model: 4136
Implantable Pulse Generator Implant Date: 20150130
Lead Channel Pacing Threshold Amplitude: 0.6 V
Lead Channel Pacing Threshold Amplitude: 1.3 V
Lead Channel Pacing Threshold Pulse Width: 0.5 ms
Lead Channel Sensing Intrinsic Amplitude: 11.7 mV
Lead Channel Setting Pacing Pulse Width: 0.4 ms
Lead Channel Setting Sensing Sensitivity: 2 mV
MDC IDC LEAD LOCATION: 753859
MDC IDC LEAD LOCATION: 753860
MDC IDC LEAD SERIAL: 29813363
MDC IDC MSMT LEADCHNL RA IMPEDANCE VALUE: 385 Ohm
MDC IDC MSMT LEADCHNL RV IMPEDANCE VALUE: 681 Ohm
MDC IDC MSMT LEADCHNL RV PACING THRESHOLD PULSEWIDTH: 0.5 ms
MDC IDC PG SERIAL: 393477
MDC IDC SESS DTM: 20180919040000
MDC IDC SET LEADCHNL RA PACING AMPLITUDE: 2 V
MDC IDC SET LEADCHNL RV PACING AMPLITUDE: 1.2 V

## 2017-06-19 NOTE — Patient Instructions (Addendum)
Medication Instructions:  Your physician has recommended you make the following change in your medication: 1. HOLD your Multaq  for one week. Please call the office to let us know if GI symptoms improved after stopping this medication.    --- If you need a refill on your cardiac medications before your next appointment, please call your pharmacy. ---  Labwork: None ordered  Testing/Procedures: None ordered  Follow-Up: Remote monitoring is used to monitor your Pacemaker of ICD from home. This monitoring reduces the number of office visits required to check your device to one time per year. It allows [place] to keep an eye on the functioning of your device to ensure it is working properly. You are scheduled for a device check from home on 08/05/2017. You may send your transmission at any time that day. If you have a wireless device, the transmission will be sent automatically. After your physician reviews your transmission, you will receive a postcard with your next transmission date.  Your physician wants you to follow-up in: 1 year with Dr. Curt Bears.  You will receive a reminder letter in the mail two months in advance. If you don't receive a letter, please call our office to schedule the follow-up appointment.  Thank you for choosing [facility]!!   Trinidad Curet, RN [phone]

## 2017-06-19 NOTE — Patient Instructions (Signed)
Medication Instructions:  None  Labwork: None  Testing/Procedures: None  Follow-Up: Your physician wants you to follow-up in: 6 months with Dr. Smith.  You will receive a reminder letter in the mail two months in advance. If you don't receive a letter, please call our office to schedule the follow-up appointment.   Any Other Special Instructions Will Be Listed Below (If Applicable).     If you need a refill on your cardiac medications before your next appointment, please call your pharmacy.   

## 2017-06-19 NOTE — Progress Notes (Signed)
Electrophysiology Office Note   Date:  06/19/2017   ID:  Johana Hopkinson, DOB Jan 12, 1935, MRN [id number]  PCP:  [name], [name], MD  Cardiologist:  Tamala Julian Primary Electrophysiologist:  Juliocesar Blasius Meredith Leeds, MD    Chief Complaint  Patient presents with  . Pacemaker Check    SIck sinus syndrome     History of Present Illness: [name] is a 82 y.o. female who presents today for electrophysiology evaluation.   Hx CAD, previous coronary stenting including circumflex and right coronary arteries, hypertension, diabetes, COPD, permanent [employer] pacemaker for tachybradycardia syndrome, and chronic anticoagulation therapy. She presents today for follow-up of her tachybradycardia syndrome, pacemaker, and atrial fibrillation.   Today, denies symptoms of palpitations, chest pain, shortness of breath, orthopnea, PND, lower extremity edema, claudication, dizziness, presyncope, syncope, bleeding, or neurologic sequela. The patient is tolerating medications without difficulties. She has been having GI complaints. She says that her bowels have been off and she has abdominal pain. She feels that this may be due to her Multaq. Otherwise she feels well. She is tired quite a bit of the time, though she does say that she is not sleeping at night, as she is distracted by her tablet.    Past Medical History:  Diagnosis Date  . CAD (coronary artery disease), native coronary artery    PTCA of distal right coronary artery 1997 Cypher stents to circumflex 2005 Cardiac cath in 2011 with patent stent to circumflex and moderate disease elsewhere treated medically   . Cardiac pacemaker in situ 12/24/2015   Original implant reportedly in 1991 for tachybradycardia syndrome, generator change in 1999, 2004 and evidently again in 2016 in [place]   . COPD (chronic obstructive pulmonary disease) ([place])   . Diabetes mellitus without complication ([place])   . Hypertension   . Hypothyroidism 03/23/2010  . Pacemaker     . Paroxysmal atrial fibrillation ([place]) 03/23/2010   CHA2DS2VASC score 5    Past Surgical History:  Procedure Laterality Date  . CARDIAC CATHETERIZATION N/A 12/26/2015   Procedure: Left Heart Cath and Coronary Angiography;  Surgeon: Peter M [name], MD;  Location: [facility];  Service: Cardiovascular;  Laterality: N/A;  . CARDIOVERSION  05/2015  . CHOLECYSTECTOMY  2012  . CORONARY ANGIOPLASTY WITH STENT PLACEMENT  1990  . HERNIA REPAIR    . PACEMAKER GENERATOR CHANGE  [id number], 2016  . PACEMAKER INSERTION  1991     Current Outpatient Prescriptions  Medication Sig Dispense Refill  . albuterol (PROVENTIL HFA;VENTOLIN HFA) 108 (90 Base) MCG/ACT inhaler Inhale 2 puffs into the lungs every 6 (six) hours as needed for wheezing or shortness of breath. 1 Inhaler 1  . atorvastatin (LIPITOR) 10 MG tablet TAKE 1 TABLET BY MOUTH DAILY 90 tablet 2  . carvedilol (COREG) 12.5 MG tablet Take 12.5 mg by mouth 2 (two) times daily with a meal.    . Choline Fenofibrate (FENOFIBRIC ACID) 45 MG CPDR Take 45 mg by mouth daily.    Marland Kitchen dronedarone (MULTAQ) 400 MG tablet Take 400 mg by mouth 2 (two) times daily with a meal.    . esomeprazole (NEXIUM) 40 MG capsule TAKE ONE CAPSULE EVERY DAY 90 capsule 1  . levothyroxine (SYNTHROID, LEVOTHROID) 112 MCG tablet TAKE 1 TABLET (112 MCG TOTAL) BY MOUTH DAILY BEFORE BREAKFAST. 30 tablet 3  . linagliptin (TRADJENTA) 5 MG TABS tablet Take 5 mg by mouth daily.    Marland Kitchen losartan (COZAAR) 100 MG tablet Take 100 mg by mouth daily.    Marland Kitchen  metFORMIN (GLUCOPHAGE) 500 MG tablet TAKE 1 TABLET (500 MG TOTAL) BY MOUTH EVERY EVENING. 90 tablet 1  . nicotine (NICODERM CQ - DOSED IN MG/24 HOURS) 14 mg/24hr patch Place 1 patch (14 mg total) onto the skin daily. 60 patch 0  . nitroGLYCERIN (NITROSTAT) 0.4 MG SL tablet Place 0.4 mg under the tongue every 5 (five) minutes x 3 doses as needed for chest pain (Max 3 doses within 15 min. Call 911).    Alveda Reasons 20 MG TABS tablet TAKE 1 TABLET  (20 MG TOTAL) BY MOUTH DAILY WITH SUPPER. 30 tablet 0   No current facility-administered medications for this visit.     Allergies:   Patient has no known allergies.   Social History:  The patient  reports that she has been smoking.  She has been smoking about 0.50 packs per day. She has never used smokeless tobacco. She reports that she does not drink alcohol or use drugs.   Family History:  The patient's family history includes Cirrhosis in her brother; Heart disease in her father and mother.    ROS:  Please see the history of present illness.   Otherwise, review of systems is positive for none.   All other systems are reviewed and negative.   PHYSICAL EXAM: VS:  BP 138/62   Pulse 71   Ht 5\' 2"  (1.575 m)   Wt 137 lb (62.1 kg)   LMP  (LMP Unknown)   SpO2 96%   BMI 25.06 kg/m  , BMI Body mass index is 25.06 kg/m. GEN: Well nourished, well developed, in no acute distress  HEENT: normal  Neck: no JVD, carotid bruits, or masses Cardiac: RRR; no murmurs, rubs, or gallops,no edema  Respiratory:  clear to auscultation bilaterally, normal work of breathing GI: soft, nontender, nondistended, + BS MS: no deformity or atrophy  Skin: warm and dry, device site well healed Neuro:  Strength and sensation are intact Psych: euthymic mood, full affect  EKG:  EKG is not ordered today. Personal review of the ekg ordered 03/11/17 shows A pacing, anterior T wave changes  Personal review of the device interrogation today. Results in [place]: 05/10/2017: BUN 13; Creatinine, Ser 0.93; Hemoglobin 11.4; Platelets 196.0; Potassium 3.8; Sodium 142; TSH 0.35    Lipid Panel     Component Value Date/Time   CHOL 132 03/10/2017 0457   TRIG 61 03/10/2017 0457   HDL 56 03/10/2017 0457   CHOLHDL 2.4 03/10/2017 0457   VLDL 12 03/10/2017 0457   LDLCALC 64 03/10/2017 0457     Wt Readings from Last 3 Encounters:  06/19/17 137 lb (62.1 kg)  06/19/17 137 lb (62.1 kg)  05/10/17 138 lb 4  oz (62.7 kg)      Other studies Reviewed: Additional studies/ records that were reviewed today include:  TTE 12/25/15 - Left ventricle: The cavity size was normal. Systolic function was   normal. The estimated ejection fraction was in the range of 55%   to 60%. Wall motion was normal; there were no regional wall   motion abnormalities. The study is not technically sufficient to   allow evaluation of LV diastolic function. - Aortic valve: Transvalvular velocity was within the normal range.   There was no stenosis. There was mild to moderate regurgitation. - Mitral valve: There was no regurgitation. - Left atrium: The atrium was severely dilated. - Right ventricle: The cavity size was normal. Wall thickness was   normal. Systolic function was normal. -  Right atrium: The atrium was severely dilated. - Tricuspid valve: There was mild regurgitation. - Pulmonary arteries: Systolic pressure was within the normal   range. PA peak pressure: 23 mm Hg (S).   Cardiac cath 12/26/15   Prox RCA to Dist RCA lesion, 30% stenosed.  Prox LAD lesion, 40% stenosed.  Mid LAD to Dist LAD lesion, 45% stenosed.  Ost Cx to Prox Cx lesion, 10% stenosed. The lesion was previously treated with a drug-eluting stent greater than two years ago. The left ventricular systolic function is normal.   ASSESSMENT AND PLAN:  1.  Tachy-Brady syndrome: Status post Catering manager. Pacemaker functioning appropriately. No changes at this time.  2. Paroxysmal atrial fibrillation: On Xarelto for anticoagulation. She also takes Multaq. She has been having GI issues. She feels like this could be due to the Multaq. We'll plan to stop for one week and reassess at that time.  This patients CHA2DS2-VASc Score and unadjusted Ischemic Stroke Rate (% per year) is equal to 9.7 % stroke rate/year from a score of 6  Above score calculated as 1 point each if present [CHF, HTN, DM, Vascular=MI/PAD/Aortic  Plaque, Age if 65-74, or Female] Above score calculated as 2 points each if present [Age > 75, or Stroke/TIA/TE]  3. Hypertension: Well-controlled. Continue current management.  4. Coronary artery disease: No current chest pain.  Current medicines are reviewed at length with the patient today.   The patient does not have concerns regarding her medicines.  The following changes were made today:  Hold Multaq  Labs/ tests ordered today include:  No orders of the defined types were placed in this encounter.    Disposition:   FU with Breahna Boylen 1 year  Signed, Allee Busk Meredith Leeds, MD  06/19/2017 11:14 AM     [facility] [address] [place] [address] [phone] (office) [phone] (fax) That helps she in with burning

## 2017-06-24 ENCOUNTER — Telehealth: Payer: Self-pay | Admitting: Family Medicine

## 2017-06-24 MED ORDER — LEVOTHYROXINE SODIUM 112 MCG PO TABS: 112.0000 ug | ORAL_TABLET | Freq: Every day | ORAL | 3 refills | Status: DC

## 2017-06-24 NOTE — Telephone Encounter (Signed)
Rx sent 

## 2017-06-24 NOTE — Telephone Encounter (Signed)
° ° ° °  Pt said the below is on back order at [facility]. She contacted [facility] on [address] and they have it. She ask if a RX could be sent there for the below med   levothyroxine (SYNTHROID, LEVOTHROID) 112 MCG tablet

## 2017-06-26 ENCOUNTER — Encounter: Payer: Self-pay | Admitting: *Deleted

## 2017-06-26 ENCOUNTER — Telehealth: Payer: Self-pay | Admitting: Cardiology

## 2017-06-26 NOTE — Telephone Encounter (Signed)
New message    Pt c/o medication issue:  1. Name of Medication: multaq  2. How are you currently taking this medication (dosage and times per day)? 400 mg  3. Are you having a reaction (difficulty breathing--STAT)? no  4. What is your medication issue? Pt wants to know if she needs to start this again or continue not taking it.

## 2017-06-26 NOTE — Telephone Encounter (Signed)
Stopped Multaq on Wed 9/19 Saturday had "afib" and took one multaq Went back to old symptoms after taking it -diarrhea/stomach pain. Pt understands I will send to Dr. Curt Bears to review and advise on and call her by the end of the week. She is agreeable to plan.

## 2017-06-27 ENCOUNTER — Encounter: Payer: Self-pay | Admitting: *Deleted

## 2017-06-28 NOTE — Telephone Encounter (Signed)
Advised pt she has 2 options for medications (per Dr. Curt Bears):  Amiodarone or Tikosyn  Pt is going to investigate these medications over the weekend and we talk next week on her decision.

## 2017-07-01 ENCOUNTER — Other Ambulatory Visit: Payer: Self-pay | Admitting: Family Medicine

## 2017-07-03 ENCOUNTER — Other Ambulatory Visit: Payer: Self-pay | Admitting: Family Medicine

## 2017-07-05 ENCOUNTER — Other Ambulatory Visit: Payer: Self-pay | Admitting: Family Medicine

## 2017-07-07 ENCOUNTER — Other Ambulatory Visit: Payer: Self-pay | Admitting: Family Medicine

## 2017-07-08 NOTE — Telephone Encounter (Signed)
Pt not comfortable taking Amiodarone/Tikosyn. She would like to take Multaq QOD.  Explained that we would scheduled OV to discuss options in more detail with physician. Appt made for Wednesday, 10/10. Patient verbalized understanding and agreeable to plan.

## 2017-07-09 ENCOUNTER — Other Ambulatory Visit: Payer: Self-pay | Admitting: *Deleted

## 2017-07-09 NOTE — Telephone Encounter (Addendum)
Request for Xarelto 20mg  received, pt is 82 yrs old, wt-62.1kg, Crea-0.93 on 05/10/17, last seen by Dr. Tamala Julian & Camnitz on 06/19/17 and CrCl-47.33ml/min. Per dosing criteria pt is incorrect dose of Xarelto & needs to be on 15mg . Message sent to Dr. Tamala Julian regarding dose change.

## 2017-07-10 ENCOUNTER — Encounter: Payer: Self-pay | Admitting: Cardiology

## 2017-07-10 ENCOUNTER — Ambulatory Visit (INDEPENDENT_AMBULATORY_CARE_PROVIDER_SITE_OTHER): Payer: Medicare Other | Admitting: Cardiology

## 2017-07-10 ENCOUNTER — Telehealth: Payer: Self-pay | Admitting: *Deleted

## 2017-07-10 VITALS — BP 132/62 | HR 70 | Ht 62.0 in | Wt 137.0 lb

## 2017-07-10 DIAGNOSIS — I10 Essential (primary) hypertension: Secondary | ICD-10-CM | POA: Diagnosis not present

## 2017-07-10 DIAGNOSIS — I480 Paroxysmal atrial fibrillation: Secondary | ICD-10-CM | POA: Diagnosis not present

## 2017-07-10 DIAGNOSIS — I2510 Atherosclerotic heart disease of native coronary artery without angina pectoris: Secondary | ICD-10-CM

## 2017-07-10 DIAGNOSIS — I495 Sick sinus syndrome: Secondary | ICD-10-CM | POA: Diagnosis not present

## 2017-07-10 MED ORDER — DRONEDARONE HCL 400 MG PO TABS: 400.0000 mg | ORAL_TABLET | Freq: Two times a day (BID) | ORAL | 6 refills | Status: DC

## 2017-07-10 MED ORDER — RIVAROXABAN 15 MG PO TABS: 15.0000 mg | ORAL_TABLET | Freq: Every day | ORAL | 5 refills | Status: DC

## 2017-07-10 NOTE — Patient Instructions (Signed)
Medication Instructions:  Your physician has recommended you make the following change in your medication:  1. DECREASE XARELTO to 15 mg once daily 2. START MULTAQ 400 mg once daily -- if this does not work well with your stomach, you may take it every other day.  Labwork: None ordered  Testing/Procedures: None ordered  Follow-Up: Your physician wants you to follow-up in: 6 months with Dr. Curt Bears.  You will receive a reminder letter in the mail two months in advance. If you don't receive a letter, please call our office to schedule the follow-up appointment.  -- If you need a refill on your cardiac medications before your next appointment, please call your pharmacy. --  Thank you for choosing [facility]!!   Trinidad Curet, RN [phone]  Any Other Special Instructions Will Be Listed Below (If Applicable).

## 2017-07-10 NOTE — Telephone Encounter (Signed)
-----   Message from [name], MD sent at 07/09/2017  8:58 PM EDT ----- Florentina Jenny change ----- Message ----- From: Marcos Eke, RN Sent: 07/09/2017   4:34 PM To: [name], MD  Good afternoon,  Dr. Tamala Julian the pt requested a refill on her Xarelto 20mg . Per dosing criteria pt should be on Xarelto 15mg  as pt is 82 yrs old, wt-62.1kg, Crea-0.93 on 05/10/17, and CrCl-47.74ml/min. Please advise if you approve changing pt's Xarelto dose to 15mg  from 20mg  & I will send in the refill.  Thank You Derrel Nip, RN

## 2017-07-10 NOTE — Progress Notes (Signed)
Electrophysiology Office Note   Date:  07/10/2017   ID:  [name], DOB 24-Jul-1935, MRN [id number]  PCP:  [name], [name], MD  Cardiologist:  Tamala Julian Primary Electrophysiologist:  Lear Carstens Meredith Leeds, MD    Chief Complaint  Patient presents with  . Follow-up    PAF/Sick sinus syndrome     History of Present Illness: [name] is a 82 y.o. female who presents today for electrophysiology evaluation.   Hx CAD, previous coronary stenting including circumflex and right coronary arteries, hypertension, diabetes, COPD, permanent [employer] pacemaker for tachybradycardia syndrome, and chronic anticoagulation therapy. She presents today for follow-up of her tachybradycardia syndrome, pacemaker, and atrial fibrillation. She has tried Multaq in the past but has not tolerated due to GI issues.  Today, denies symptoms of palpitations, chest pain, shortness of breath, orthopnea, PND, lower extremity edema, claudication, dizziness, presyncope, syncope, bleeding, or neurologic sequela. The patient is tolerating medications without difficulties. She has been taking Multaq once every other day. She is tolerating this well without her GI symptoms of nausea and abdominal pain. She has not noted any episodes of atrial fibrillation.   Past Medical History:  Diagnosis Date  . CAD (coronary artery disease), native coronary artery    PTCA of distal right coronary artery 1997 Cypher stents to circumflex 2005 Cardiac cath in 2011 with patent stent to circumflex and moderate disease elsewhere treated medically   . Cardiac pacemaker in situ 12/24/2015   Original implant reportedly in 1991 for tachybradycardia syndrome, generator change in 1999, 2004 and evidently again in 2016 in [place]   . COPD (chronic obstructive pulmonary disease) ([place])   . Diabetes mellitus without complication ([place])   . Hypertension   . Hypothyroidism 03/23/2010  . Pacemaker   . Paroxysmal atrial fibrillation ([place]) 03/23/2010     CHA2DS2VASC score 5    Past Surgical History:  Procedure Laterality Date  . CARDIAC CATHETERIZATION N/A 12/26/2015   Procedure: Left Heart Cath and Coronary Angiography;  Surgeon: Peter M [name], MD;  Location: [facility];  Service: Cardiovascular;  Laterality: N/A;  . CARDIOVERSION  05/2015  . CHOLECYSTECTOMY  2012  . CORONARY ANGIOPLASTY WITH STENT PLACEMENT  1990  . HERNIA REPAIR    . PACEMAKER GENERATOR CHANGE  [id number], 2016  . PACEMAKER INSERTION  1991     Current Outpatient Prescriptions  Medication Sig Dispense Refill  . albuterol (PROVENTIL HFA;VENTOLIN HFA) 108 (90 Base) MCG/ACT inhaler Inhale 2 puffs into the lungs every 6 (six) hours as needed for wheezing or shortness of breath. 1 Inhaler 1  . atorvastatin (LIPITOR) 10 MG tablet TAKE 1 TABLET BY MOUTH DAILY 90 tablet 2  . carvedilol (COREG) 12.5 MG tablet Take 12.5 mg by mouth 2 (two) times daily with a meal.    . Choline Fenofibrate (FENOFIBRIC ACID) 45 MG CPDR Take 45 mg by mouth daily.    Marland Kitchen esomeprazole (NEXIUM) 40 MG capsule TAKE ONE CAPSULE EVERY DAY 90 capsule 1  . levothyroxine (SYNTHROID, LEVOTHROID) 112 MCG tablet Take 1 tablet (112 mcg total) by mouth daily before breakfast. 30 tablet 3  . linagliptin (TRADJENTA) 5 MG TABS tablet Take 5 mg by mouth daily.    Marland Kitchen losartan (COZAAR) 100 MG tablet Take 100 mg by mouth daily.    . metFORMIN (GLUCOPHAGE) 500 MG tablet TAKE 1 TABLET (500 MG TOTAL) BY MOUTH EVERY EVENING. 90 tablet 1  . nicotine (NICODERM CQ - DOSED IN MG/24 HOURS) 14 mg/24hr patch Place 1 patch (  14 mg total) onto the skin daily. 60 patch 0  . nitroGLYCERIN (NITROSTAT) 0.4 MG SL tablet Place 0.4 mg under the tongue every 5 (five) minutes x 3 doses as needed for chest pain (Max 3 doses within 15 min. Call 911).    . Rivaroxaban (XARELTO) 15 MG TABS tablet Take 1 tablet (15 mg total) by mouth daily with supper. 30 tablet 5  . dronedarone (MULTAQ) 400 MG tablet Take 1 tablet (400 mg total) by mouth 2  (two) times daily with a meal. 30 tablet 6   No current facility-administered medications for this visit.     Allergies:   Patient has no known allergies.   Social History:  The patient  reports that she has been smoking.  She has been smoking about 0.50 packs per day. She has never used smokeless tobacco. She reports that she does not drink alcohol or use drugs.   Family History:  The patient's family history includes Cirrhosis in her brother; Heart disease in her father and mother.    ROS:  Please see the history of present illness.   Otherwise, review of systems is positive for palpitations, abdominal pain, nausea, headaches.   All other systems are reviewed and negative.   PHYSICAL EXAM: VS:  BP 132/62   Pulse 70   Ht 5\' 2"  (1.575 m)   Wt 137 lb (62.1 kg)   LMP  (LMP Unknown)   BMI 25.06 kg/m  , BMI Body mass index is 25.06 kg/m. GEN: Well nourished, well developed, in no acute distress  HEENT: normal  Neck: no JVD, carotid bruits, or masses Cardiac: RRR; no murmurs, rubs, or gallops,no edema  Respiratory:  clear to auscultation bilaterally, normal work of breathing GI: soft, nontender, nondistended, + BS MS: no deformity or atrophy  Skin: warm and dry Neuro:  Strength and sensation are intact Psych: euthymic mood, full affect  EKG:  EKG is ordered today. Personal review of the ekg ordered shows A paced, rate 70   Recent Labs: 05/10/2017: BUN 13; Creatinine, Ser 0.93; Hemoglobin 11.4; Platelets 196.0; Potassium 3.8; Sodium 142; TSH 0.35    Lipid Panel     Component Value Date/Time   CHOL 132 03/10/2017 0457   TRIG 61 03/10/2017 0457   HDL 56 03/10/2017 0457   CHOLHDL 2.4 03/10/2017 0457   VLDL 12 03/10/2017 0457   LDLCALC 64 03/10/2017 0457     Wt Readings from Last 3 Encounters:  07/10/17 137 lb (62.1 kg)  06/19/17 137 lb (62.1 kg)  06/19/17 137 lb (62.1 kg)      Other studies Reviewed: Additional studies/ records that were reviewed today include:    TTE 12/25/15 - Left ventricle: The cavity size was normal. Systolic function was   normal. The estimated ejection fraction was in the range of 55%   to 60%. Wall motion was normal; there were no regional wall   motion abnormalities. The study is not technically sufficient to   allow evaluation of LV diastolic function. - Aortic valve: Transvalvular velocity was within the normal range.   There was no stenosis. There was mild to moderate regurgitation. - Mitral valve: There was no regurgitation. - Left atrium: The atrium was severely dilated. - Right ventricle: The cavity size was normal. Wall thickness was   normal. Systolic function was normal. - Right atrium: The atrium was severely dilated. - Tricuspid valve: There was mild regurgitation. - Pulmonary arteries: Systolic pressure was within the normal   range. PA peak  pressure: 23 mm Hg (S).   Cardiac cath 12/26/15   Prox RCA to Dist RCA lesion, 30% stenosed.  Prox LAD lesion, 40% stenosed.  Mid LAD to Dist LAD lesion, 45% stenosed.  Ost Cx to Prox Cx lesion, 10% stenosed. The lesion was previously treated with a drug-eluting stent greater than two years ago. The left ventricular systolic function is normal.   ASSESSMENT AND PLAN:  1.  Tachy-Brady syndrome: Status post [employer] pacemaker. No changes at this time.  2. Paroxysmal atrial fibrillation: Currently on Xarelto. Has been taking Multaq every other day and not having issues with atrial fibrillation or nausea and abdominal pain. I have asked her to try Multaq every day, but if this causes her symptoms she Avielle Imbert back off to every other day. If she has further episodes of atrial fibrillation, she would likely benefit from admission for Tikosyn load.  This patients CHA2DS2-VASc Score and unadjusted Ischemic Stroke Rate (% per year) is equal to 7.2 % stroke rate/year from a score of 5  Above score calculated as 1 point each if present [CHF, HTN, DM,  Vascular=MI/PAD/Aortic Plaque, Age if 65-74, or Female] Above score calculated as 2 points each if present [Age > 75, or Stroke/TIA/TE]  3. Hypertension: Well-controlled today. Continue current management.  4. Coronary artery disease: No current chest pain  Current medicines are reviewed at length with the patient today.   The patient does not have concerns regarding her medicines.  The following changes were made today:  Hold Multaq  Labs/ tests ordered today include:  Orders Placed This Encounter  Procedures  . EKG 12-Lead     Disposition:   FU with Rhylee Pucillo 6 months  Signed, Jessy Calixte Meredith Leeds, MD  07/10/2017 10:43 AM     [facility] [address] [place] [address] [phone] (office) [phone] (fax) That helps she in with burning

## 2017-07-10 NOTE — Telephone Encounter (Signed)
07/10/17: Called pt & person answering phone stated she was on her way here for an appt. Looked on scheduled & saw she was due to come to Dr. Curt Bears at [phone] today. Called Dr. Vinson Moselle & spoke with Sherri RN & explained that Xarelto dose must be changed to 15mg  & asked if she could notify pt on the visit today & she stated she would & I sent in refill for Xarelto 15mg  daily.

## 2017-08-05 ENCOUNTER — Ambulatory Visit (INDEPENDENT_AMBULATORY_CARE_PROVIDER_SITE_OTHER): Payer: Medicare Other | Admitting: *Deleted

## 2017-08-05 ENCOUNTER — Telehealth: Payer: Self-pay | Admitting: Family Medicine

## 2017-08-05 DIAGNOSIS — I480 Paroxysmal atrial fibrillation: Secondary | ICD-10-CM | POA: Diagnosis not present

## 2017-08-05 MED ORDER — LINAGLIPTIN 5 MG PO TABS: 5.0000 mg | ORAL_TABLET | Freq: Every day | ORAL | 0 refills | Status: DC

## 2017-08-05 NOTE — Telephone Encounter (Signed)
Pt needs new rx tradjenta 5 mg sent to [address]. This med was prescribe by old md. Pt has an appt on 09/09/17

## 2017-08-05 NOTE — Progress Notes (Signed)
Remote pacemaker transmission.   

## 2017-08-05 NOTE — Telephone Encounter (Signed)
Rx for tradjenta 5 mg can be sent to her pharmacy to continue 1 tab daily. #90/0.  Thanks, BJ

## 2017-08-05 NOTE — Telephone Encounter (Signed)
Rx sent to pharmacy   

## 2017-08-07 ENCOUNTER — Encounter: Payer: Self-pay | Admitting: Cardiology

## 2017-08-16 LAB — CUP PACEART REMOTE DEVICE CHECK
Battery Remaining Percentage: 78 %
Date Time Interrogation Session: 20181105071100
Implantable Lead Implant Date: 19990610
Implantable Lead Location: 753860
Implantable Lead Serial Number: 29813363
Implantable Pulse Generator Implant Date: 20150130
Lead Channel Impedance Value: 635 Ohm
Lead Channel Pacing Threshold Amplitude: 1.3 V
Lead Channel Pacing Threshold Pulse Width: 0.4 ms
Lead Channel Pacing Threshold Pulse Width: 0.5 ms
Lead Channel Setting Pacing Pulse Width: 0.4 ms
Lead Channel Setting Sensing Sensitivity: 2 mV
MDC IDC LEAD IMPLANT DT: 20160713
MDC IDC LEAD LOCATION: 753859
MDC IDC MSMT BATTERY REMAINING LONGEVITY: 48 mo
MDC IDC MSMT LEADCHNL RA IMPEDANCE VALUE: 364 Ohm
MDC IDC MSMT LEADCHNL RV PACING THRESHOLD AMPLITUDE: 0.8 V
MDC IDC SET LEADCHNL RA PACING AMPLITUDE: 2 V
MDC IDC SET LEADCHNL RV PACING AMPLITUDE: 1.3 V
MDC IDC STAT BRADY RA PERCENT PACED: 100 %
MDC IDC STAT BRADY RV PERCENT PACED: 5 %
Pulse Gen Serial Number: 393477

## 2017-08-30 ENCOUNTER — Other Ambulatory Visit: Payer: Self-pay | Admitting: Family Medicine

## 2017-09-09 ENCOUNTER — Ambulatory Visit: Payer: Medicare Other | Admitting: Family Medicine

## 2017-10-08 NOTE — Progress Notes (Signed)
HPI:   [name] is a 83 y.o. female, who is here today with her caregiver for 5 months follow up.   She was last seen on 05/10/2017.  Since her last OV she has Dr Camnitz,cardiologist.   DM II: On Metformin 500 mg at night,Tradgenta 5 mg.  Glipizide was discontinued last visit, she is still taking it. Denies abdominal pain, nausea,vomiting, polydipsia,polyuria, or polyphagia.  BS's: FG Low 100's Hypoglycemia: Denies. Last eye exam: 2018.  Lab Results  Component Value Date   HGBA1C 6.2 05/10/2017     HTN: She is on Cozaar 100 mg and Carvedilol 12.5 mg bid.  Denies headache, visual changes, chest pain, dyspnea,claudication, focal weakness, or edema.  Lab Results  Component Value Date   CREATININE 0.93 05/10/2017   BUN 13 05/10/2017   NA 142 05/10/2017   K 3.8 05/10/2017   CL 108 05/10/2017   CO2 28 05/10/2017   HLD and atrial fib, following with cardiologist.   Hypothyroidism:  Currently she is on Levothyroxine 112 mcg daily.  Tolerating medication well, no side effects reported. She has not noted dysphagia, palpitations, abdominal pain, changes in bowel habits, tremor, cold/heat intolerance, or abnormal weight loss.  Lab Results  Component Value Date   TSH 0.35 05/10/2017   Lab Results  Component Value Date   CHOL 132 03/10/2017   HDL 56 03/10/2017   LDLCALC 64 03/10/2017   TRIG 61 03/10/2017   CHOLHDL 2.4 03/10/2017     Since she had her flu shot she has been sneezing and having clear rhinorrhea. She has not used OTC medication. She had Flonase in the past.  History of COPD, she is having intermittent wheezing.  She denies worsening cough or exertional dyspnea. She has not had fever, chills, or body aches. She has not used her Albuterol inhaler.   Review of Systems  Constitutional: Negative for activity change, appetite change, fatigue, fever and unexpected weight change.  HENT: Positive for congestion, postnasal drip, rhinorrhea  and sneezing. Negative for mouth sores, nosebleeds, sinus pressure, sore throat and trouble swallowing.   Eyes: Negative for redness and visual disturbance.  Respiratory: Positive for wheezing. Negative for cough and shortness of breath.   Cardiovascular: Negative for chest pain, palpitations and leg swelling.  Gastrointestinal: Negative for abdominal pain, nausea and vomiting.       Negative for changes in bowel habits.  Endocrine: Negative for cold intolerance, heat intolerance, polydipsia, polyphagia and polyuria.  Genitourinary: Negative for decreased urine volume, dysuria and hematuria.  Skin: Negative for rash.  Allergic/Immunologic: Positive for environmental allergies.  Neurological: Negative for syncope, weakness, numbness and headaches.     Current Outpatient Medications on File Prior to Visit  Medication Sig Dispense Refill  . albuterol (PROVENTIL HFA;VENTOLIN HFA) 108 (90 Base) MCG/ACT inhaler Inhale 2 puffs into the lungs every 6 (six) hours as needed for wheezing or shortness of breath. 1 Inhaler 1  . atorvastatin (LIPITOR) 10 MG tablet TAKE 1 TABLET BY MOUTH DAILY 90 tablet 2  . carvedilol (COREG) 12.5 MG tablet Take 12.5 mg by mouth 2 (two) times daily with a meal.    . Choline Fenofibrate (FENOFIBRIC ACID) 45 MG CPDR Take 45 mg by mouth daily.    Marland Kitchen dronedarone (MULTAQ) 400 MG tablet Take 1 tablet (400 mg total) by mouth 2 (two) times daily with a meal. 30 tablet 6  . esomeprazole (NEXIUM) 40 MG capsule TAKE ONE CAPSULE EVERY DAY 90 capsule 1  . losartan (  COZAAR) 100 MG tablet Take 100 mg by mouth daily.    . nicotine (NICODERM CQ - DOSED IN MG/24 HOURS) 14 mg/24hr patch Place 1 patch (14 mg total) onto the skin daily. 60 patch 0  . nitroGLYCERIN (NITROSTAT) 0.4 MG SL tablet Place 0.4 mg under the tongue every 5 (five) minutes x 3 doses as needed for chest pain (Max 3 doses within 15 min. Call 911).    . Rivaroxaban (XARELTO) 15 MG TABS tablet Take 1 tablet (15 mg total) by  mouth daily with supper. 30 tablet 5   No current facility-administered medications on file prior to visit.      Past Medical History:  Diagnosis Date  . CAD (coronary artery disease), native coronary artery    PTCA of distal right coronary artery 1997 Cypher stents to circumflex 2005 Cardiac cath in 2011 with patent stent to circumflex and moderate disease elsewhere treated medically   . Cardiac pacemaker in situ 12/24/2015   Original implant reportedly in 1991 for tachybradycardia syndrome, generator change in 1999, 2004 and evidently again in 2016 in [place]   . COPD (chronic obstructive pulmonary disease) ([place])   . Diabetes mellitus without complication ([place])   . Hypertension   . Hypothyroidism 03/23/2010  . Pacemaker   . Paroxysmal atrial fibrillation ([place]) 03/23/2010   CHA2DS2VASC score 5    No Known Allergies  Social History   Socioeconomic History  . Marital status: Widowed    Spouse name: None  . Number of children: None  . Years of education: None  . Highest education level: None  Social Needs  . Financial resource strain: None  . Food insecurity - worry: None  . Food insecurity - inability: None  . Transportation needs - medical: None  . Transportation needs - non-medical: None  Occupational History  . None  Tobacco Use  . Smoking status: Current Every Day Smoker    Packs/day: 0.50  . Smokeless tobacco: Never Used  Substance and Sexual Activity  . Alcohol use: No    Alcohol/week: 0.0 oz  . Drug use: No  . Sexual activity: None  Other Topics Concern  . None  Social History Narrative  . None    Vitals:   10/09/17 0911  BP: 136/70  Pulse: 74  Resp: 16  Temp: 98.5 F (36.9 C)  SpO2: 96%   Body mass index is 24.9 kg/m.   Physical Exam  Nursing note and vitals reviewed. Constitutional: She is oriented to person, place, and time. She appears well-developed and well-nourished. No distress.  HENT:  Head: Normocephalic and atraumatic.    Mouth/Throat: Oropharynx is clear and moist and mucous membranes are normal.  Eyes: Conjunctivae are normal. Pupils are equal, round, and reactive to light.  Neck: No tracheal deviation present. No thyroid mass present.  Cardiovascular: Normal rate and regular rhythm.  No murmur heard. Pulses:      Dorsalis pedis pulses are 2+ on the right side, and 2+ on the left side.  Respiratory: Effort normal and breath sounds normal. No respiratory distress.  GI: Soft. She exhibits no mass. There is no hepatomegaly. There is no tenderness.  Musculoskeletal: She exhibits no edema or tenderness.  Lymphadenopathy:    She has no cervical adenopathy.  Neurological: She is alert and oriented to person, place, and time. She has normal strength. Coordination normal.  Skin: Skin is warm. No erythema.  Psychiatric: She has a normal mood and affect.  Well groomed, good eye contact.  Diabetic Foot Exam - Simple   Simple Foot Form Diabetic Foot exam was performed with the following findings:  Yes 10/09/2017  9:38 AM  Visual Inspection See comments:  Yes Sensation Testing Intact to touch and monofilament testing bilaterally:  Yes Pulse Check Posterior Tibialis and Dorsalis pulse intact bilaterally:  Yes Comments Mild bunion bilateral and toes deformities.      ASSESSMENT AND PLAN:   Ms. Shivali Quackenbush was seen today for 5 months follow-up.  Diagnoses and all orders for this visit:  Lab Results  Component Value Date   HGBA1C 6.9 (H) 10/09/2017   Lab Results  Component Value Date   CREATININE 0.87 10/09/2017   BUN 21 10/09/2017   NA 141 10/09/2017   K 4.1 10/09/2017   CL 105 10/09/2017   CO2 29 10/09/2017   Lab Results  Component Value Date   MICROALBUR 1.3 10/09/2017   Lab Results  Component Value Date   TSH 0.42 10/09/2017     Diabetes mellitus without complication ([place])  AJG8T was at goal,today pending results. No changes in current management. Regular exercise and healthy  diet with avoidance of added sugar food intake is an important part of treatment and recommended. Annual eye exam, periodic dental and foot care recommended. F/U in 5 months  -     Basic metabolic panel -     Microalbumin / creatinine urine ratio -     Hemoglobin A1c -     Fructosamine -     glipiZIDE (GLUCOTROL) 5 MG tablet; Take 1 tablet (5 mg total) by mouth daily before breakfast. 20-30 minutes before meal. -     linagliptin (TRADJENTA) 5 MG TABS tablet; TAKE 1 TABLET (5 MG TOTAL) DAILY BY MOUTH. -     metFORMIN (GLUCOPHAGE) 500 MG tablet; Take 1 tablet (500 mg total) by mouth every evening.  Hypertension with heart disease  Adequately controlled. No changes in current management. DASH-low salt diet to continue. Eye exam recommended annually. F/U in 6 months, before if needed.  -     Basic metabolic panel  Postablative hypothyroidism  No changes in current management, will follow labs done today and will give further recommendations accordingly.  -     TSH -     levothyroxine (SYNTHROID, LEVOTHROID) 112 MCG tablet; Take 1 tablet (112 mcg total) by mouth daily before breakfast.  Allergic rhinitis, unspecified seasonality, unspecified trigger  Nasal saline irrigations as needed. Flonase nasal spray daily as needed. Follow-up as needed.  -     fluticasone (FLONASE) 50 MCG/ACT nasal spray; Place 1 spray into both nostrils 2 (two) times daily.  Chronic bronchitis, unspecified chronic bronchitis type ([place])  I do not think antibiotic treatment is needed at this time nor oral Prednisone. Albuterol inh 2 puff every 6 hours for a week then as needed for wheezing or shortness of breath.   Monitor for signs of complications. Instructed about warning signs. Follow-up as needed.     -Ms. [name] was advised to return sooner than planned today if new concerns arise.       [name]. [name], MD  [facility]. [place] office.

## 2017-10-09 ENCOUNTER — Ambulatory Visit (INDEPENDENT_AMBULATORY_CARE_PROVIDER_SITE_OTHER): Payer: Medicare Other | Admitting: Family Medicine

## 2017-10-09 ENCOUNTER — Encounter: Payer: Self-pay | Admitting: Family Medicine

## 2017-10-09 VITALS — BP 136/70 | HR 74 | Temp 98.5°F | Resp 16 | Ht 62.0 in | Wt 136.1 lb

## 2017-10-09 DIAGNOSIS — I119 Hypertensive heart disease without heart failure: Secondary | ICD-10-CM

## 2017-10-09 DIAGNOSIS — J42 Unspecified chronic bronchitis: Secondary | ICD-10-CM

## 2017-10-09 DIAGNOSIS — E890 Postprocedural hypothyroidism: Secondary | ICD-10-CM

## 2017-10-09 DIAGNOSIS — J309 Allergic rhinitis, unspecified: Secondary | ICD-10-CM

## 2017-10-09 DIAGNOSIS — E119 Type 2 diabetes mellitus without complications: Secondary | ICD-10-CM

## 2017-10-09 LAB — BASIC METABOLIC PANEL
BUN: 21 mg/dL (ref 6–23)
CHLORIDE: 105 meq/L (ref 96–112)
CO2: 29 meq/L (ref 19–32)
CREATININE: 0.87 mg/dL (ref 0.40–1.20)
Calcium: 9.6 mg/dL (ref 8.4–10.5)
GFR: 80.51 mL/min (ref >60.00)
Glucose, Bld: 96 mg/dL (ref 70–99)
POTASSIUM: 4.1 meq/L (ref 3.5–5.1)
Sodium: 141 mEq/L (ref 135–145)

## 2017-10-09 LAB — HEMOGLOBIN A1C: HEMOGLOBIN A1C: 6.9 % — AB (ref 4.6–6.5)

## 2017-10-09 LAB — MICROALBUMIN / CREATININE URINE RATIO
Creatinine,U: 37.2 mg/dL
MICROALB UR: 1.3 mg/dL (ref 0.0–1.9)
Microalb Creat Ratio: 3.5 mg/g (ref 0.0–30.0)

## 2017-10-09 LAB — TSH: TSH: 0.42 u[IU]/mL (ref 0.35–4.50)

## 2017-10-09 MED ORDER — FLUTICASONE PROPIONATE 50 MCG/ACT NA SUSP: 1.0000 | Freq: Two times a day (BID) | NASAL | 3 refills | Status: DC

## 2017-10-09 NOTE — Patient Instructions (Addendum)
A few things to remember from today's visit:   Diabetes mellitus without complication ([place]) - Plan: Basic metabolic panel, Microalbumin / creatinine urine ratio, Hemoglobin A1c, Fructosamine  Hypertension with heart disease - Plan: Basic metabolic panel  Postablative hypothyroidism - Plan: TSH  Allergic rhinitis, unspecified seasonality, unspecified trigger - Plan: fluticasone (FLONASE) 50 MCG/ACT nasal spray  Chronic bronchitis, unspecified chronic bronchitis type ([place])  No changes today. I will make recommendations after lab results.  Albuterol inh 2 puff every 6 hours for a week then as needed for wheezing or shortness of breath.   Please be sure medication list is accurate. If a new problem present, please set up appointment sooner than planned today.

## 2017-10-11 ENCOUNTER — Telehealth: Payer: Self-pay | Admitting: Family Medicine

## 2017-10-11 ENCOUNTER — Other Ambulatory Visit: Payer: Self-pay | Admitting: *Deleted

## 2017-10-11 LAB — FRUCTOSAMINE: Fructosamine: 248 umol/L (ref 190–270)

## 2017-10-11 MED ORDER — LEVOTHYROXINE SODIUM 112 MCG PO TABS: 112.0000 ug | ORAL_TABLET | Freq: Every day | ORAL | 2 refills | Status: DC

## 2017-10-11 MED ORDER — GLIPIZIDE 5 MG PO TABS: 5.0000 mg | ORAL_TABLET | Freq: Every day | ORAL | 1 refills | Status: DC

## 2017-10-11 MED ORDER — LINAGLIPTIN 5 MG PO TABS: ORAL_TABLET | ORAL | 2 refills | Status: DC

## 2017-10-11 MED ORDER — METFORMIN HCL 500 MG PO TABS: 500.0000 mg | ORAL_TABLET | Freq: Every evening | ORAL | 1 refills | Status: DC

## 2017-10-11 MED ORDER — GLUCOSE BLOOD VI STRP: ORAL_STRIP | 12 refills | Status: DC

## 2017-10-11 NOTE — Telephone Encounter (Signed)
Reviewed lab results and physician's note with patient. She stated she would reduce glipizide to 5mg .

## 2017-10-11 NOTE — Telephone Encounter (Signed)
Message routed to Dr. [name] for review

## 2017-10-11 NOTE — Telephone Encounter (Signed)
Copied from [place] #[id number]. Topic: Quick Communication - See Telephone Encounter >> Oct 11, 2017  1:13 PM Boyd Kerbs wrote: CRM for notification. See Telephone encounter for:   Patient switched doctors to Dr. [name] and is needing new prescription for One Touch Verio Test Strips.    Patient only has 2 strips left  [id number] [address], [place] [address] Phone: [phone] Fax: [phone]    10/11/17.

## 2017-10-11 NOTE — Telephone Encounter (Signed)
It is Ok to send diabetes supplies. Medications were sent. Thanks, BJ

## 2017-10-21 ENCOUNTER — Telehealth: Payer: Self-pay | Admitting: Family Medicine

## 2017-10-21 NOTE — Telephone Encounter (Signed)
Copied from [place] [phone] [phone]. Topic: Quick Communication - Rx Refill/Question >> Oct 21, 2017  1:34 PM Synthia Innocent wrote: Medication:  carvedilol (COREG) 12.5 MG tablet    Has the patient contacted their pharmacy? Yes.     (Agent: If no, request that the patient contact the pharmacy for the refill.)   Preferred Pharmacy (with phone number or street name): [facility]: Please be advised that RX refills may take up to 3 business days. We ask that you follow-up with your pharmacy.

## 2017-10-21 NOTE — Telephone Encounter (Signed)
Medication refill- don't see where Dr. [name] has refilled this. Thanks.

## 2017-10-22 ENCOUNTER — Other Ambulatory Visit: Payer: Self-pay | Admitting: Family Medicine

## 2017-10-22 MED ORDER — CARVEDILOL 12.5 MG PO TABS: 12.5000 mg | ORAL_TABLET | Freq: Two times a day (BID) | ORAL | 0 refills | Status: DC

## 2017-10-22 NOTE — Telephone Encounter (Signed)
Rx was sent to her pharmacy.  Thanks, BJ 

## 2017-11-04 ENCOUNTER — Ambulatory Visit (INDEPENDENT_AMBULATORY_CARE_PROVIDER_SITE_OTHER): Payer: Medicare Other | Admitting: *Deleted

## 2017-11-04 DIAGNOSIS — I495 Sick sinus syndrome: Secondary | ICD-10-CM | POA: Diagnosis not present

## 2017-11-04 NOTE — Progress Notes (Signed)
Remote pacemaker transmission.   

## 2017-11-05 ENCOUNTER — Encounter: Payer: Self-pay | Admitting: Cardiology

## 2017-11-07 ENCOUNTER — Telehealth: Payer: Self-pay | Admitting: Family Medicine

## 2017-11-08 ENCOUNTER — Other Ambulatory Visit: Payer: Self-pay | Admitting: *Deleted

## 2017-11-08 MED ORDER — LOSARTAN POTASSIUM 100 MG PO TABS: 100.0000 mg | ORAL_TABLET | Freq: Every day | ORAL | 1 refills | Status: DC

## 2017-11-08 NOTE — Telephone Encounter (Signed)
Pt calling to request a 90 day of the losartan (COZAAR) 100 MG tablet  Due to insurance.  Pt needs asap, pt states it has been 3 days without.   [id number] [address], [place] [phone] (Phone) [phone] (Fax)   A 30 day sent in this morning, can you change this please?

## 2017-11-08 NOTE — Telephone Encounter (Signed)
90 day supply sent to pharmacy

## 2017-11-29 LAB — CUP PACEART REMOTE DEVICE CHECK
Battery Remaining Longevity: 48 mo
Battery Remaining Percentage: 74 %
Brady Statistic RA Percent Paced: 100 %
Brady Statistic RV Percent Paced: 5 %
Date Time Interrogation Session: 20190204071100
Implantable Lead Implant Date: 20160713
Implantable Lead Location: 753859
Implantable Lead Model: 4136
Lead Channel Impedance Value: 339 Ohm
Lead Channel Pacing Threshold Pulse Width: 0.4 ms
Lead Channel Setting Pacing Amplitude: 1.3 V
Lead Channel Setting Pacing Amplitude: 2 V
Lead Channel Setting Pacing Pulse Width: 0.4 ms
MDC IDC LEAD IMPLANT DT: 19990610
MDC IDC LEAD LOCATION: 753860
MDC IDC LEAD SERIAL: 29813363
MDC IDC MSMT LEADCHNL RA PACING THRESHOLD AMPLITUDE: 1.3 V
MDC IDC MSMT LEADCHNL RA PACING THRESHOLD PULSEWIDTH: 0.5 ms
MDC IDC MSMT LEADCHNL RV IMPEDANCE VALUE: 637 Ohm
MDC IDC MSMT LEADCHNL RV PACING THRESHOLD AMPLITUDE: 0.7 V
MDC IDC PG IMPLANT DT: 20150130
MDC IDC PG SERIAL: 393477
MDC IDC SET LEADCHNL RV SENSING SENSITIVITY: 2 mV

## 2018-01-05 NOTE — Progress Notes (Signed)
Electrophysiology Office Note   Date:  01/06/2018   ID:  [name], DOB 1934-10-15, MRN [id number]  PCP:  [name], [name], [name]  Cardiologist:  [name] Primary Electrophysiologist:  [name] [name], [name]    Chief Complaint  Patient presents with  . Pacemaker Check    Tachycardia-bradycardia syndrome/PAF     History of Present Illness: [name] is a 83 y.o. female who presents today for electrophysiology evaluation.   Hx CAD, previous coronary stenting including circumflex and right coronary arteries, hypertension, diabetes, COPD, permanent [employer] pacemaker for tachybradycardia syndrome, and chronic anticoagulation therapy. She presents today for follow-up of her tachybradycardia syndrome, pacemaker, and atrial fibrillation. She has tried Multaq in the past but has not tolerated due to GI issues.  Today, denies symptoms of palpitations, chest pain, shortness of breath, orthopnea, PND, lower extremity edema, claudication, dizziness, presyncope, syncope, bleeding, or neurologic sequela. The patient is tolerating medications without difficulties.  Overall she is feeling well.  She does have occasional palpitations.  She is started taking her Multitak every day which is greatly improved her symptoms.  Past Medical History:  Diagnosis Date  . CAD (coronary artery disease), native coronary artery    PTCA of distal right coronary artery 1997 Cypher stents to circumflex 2005 Cardiac cath in 2011 with patent stent to circumflex and moderate disease elsewhere treated medically   . Cardiac pacemaker in situ 12/24/2015   Original implant reportedly in 1991 for tachybradycardia syndrome, generator change in 1999, 2004 and evidently again in 2016 in [place]   . COPD (chronic obstructive pulmonary disease) ([place])   . Diabetes mellitus without complication ([place])   . Hypertension   . Hypothyroidism 03/23/2010  . Pacemaker   . Paroxysmal atrial fibrillation ([place]) 03/23/2010   CHA2DS2VASC  score 5    Past Surgical History:  Procedure Laterality Date  . CARDIAC CATHETERIZATION N/A 12/26/2015   Procedure: Left Heart Cath and Coronary Angiography;  Surgeon: [name], [name];  Location: [facility];  Service: Cardiovascular;  Laterality: N/A;  . CARDIOVERSION  05/2015  . CHOLECYSTECTOMY  2012  . CORONARY ANGIOPLASTY WITH STENT PLACEMENT  1990  . HERNIA REPAIR    . PACEMAKER GENERATOR CHANGE  [id number], 2016  . PACEMAKER INSERTION  1991     Current Outpatient Medications  Medication Sig Dispense Refill  . albuterol (PROVENTIL HFA;VENTOLIN HFA) 108 (90 Base) MCG/ACT inhaler Inhale 2 puffs into the lungs every 6 (six) hours as needed for wheezing or shortness of breath. 1 Inhaler 1  . atorvastatin (LIPITOR) 10 MG tablet TAKE 1 TABLET BY MOUTH DAILY 90 tablet 2  . carvedilol (COREG) 12.5 MG tablet Take 1 tablet (12.5 mg total) by mouth 2 (two) times daily with a meal. 180 tablet 0  . dronedarone (MULTAQ) 400 MG tablet Take 1 tablet (400 mg total) by mouth 2 (two) times daily with a meal. 30 tablet 6  . esomeprazole (NEXIUM) 40 MG capsule TAKE ONE CAPSULE EVERY DAY 90 capsule 1  . fenofibrate (TRICOR) 48 MG tablet TAKE 1 TABLET EVERY DAY 90 tablet 3  . fluticasone (FLONASE) 50 MCG/ACT nasal spray Place 1 spray into both nostrils 2 (two) times daily. 16 [name] 3  . glipiZIDE (GLUCOTROL) 5 MG tablet Take 1 tablet (5 mg total) by mouth daily before breakfast. 20-30 minutes before meal. 90 tablet 1  . glucose blood test strip Use as instructed 100 each 12  . levothyroxine (SYNTHROID, LEVOTHROID) 112 MCG tablet Take 1 tablet (  112 mcg total) by mouth daily before breakfast. 90 tablet 2  . linagliptin (TRADJENTA) 5 MG TABS tablet TAKE 1 TABLET (5 MG TOTAL) DAILY BY MOUTH. 90 tablet 2  . losartan (COZAAR) 100 MG tablet Take 1 tablet (100 mg total) by mouth daily. 90 tablet 1  . metFORMIN (GLUCOPHAGE) 500 MG tablet Take 1 tablet (500 mg total) by mouth every evening. 90 tablet 1  .  nicotine (NICODERM CQ - DOSED IN MG/24 HOURS) 14 mg/24hr patch Place 1 patch (14 mg total) onto the skin daily. 60 patch 0  . nitroGLYCERIN (NITROSTAT) 0.4 MG SL tablet Place 0.4 mg under the tongue every 5 (five) minutes x 3 doses as needed for chest pain (Max 3 doses within 15 min. Call 911).    . Rivaroxaban (XARELTO) 15 MG TABS tablet Take 1 tablet (15 mg total) by mouth daily with supper. 30 tablet 5   No current facility-administered medications for this visit.     Allergies:   Patient has no known allergies.   Social History:  The patient  reports that she has been smoking.  She has been smoking about 0.50 packs per day. She has never used smokeless tobacco. She reports that she does not drink alcohol or use drugs.   Family History:  The patient's family history includes Cirrhosis in her brother; Heart disease in her father and mother.   ROS:  Please see the history of present illness.   Otherwise, review of systems is positive for oppositions, wheezing, nausea, balance problems.   All other systems are reviewed and negative.   PHYSICAL EXAM: VS:  BP 140/64   Pulse 73   Ht 5\' 2"  (1.575 m)   Wt 136 lb (61.7 kg)   LMP  (LMP Unknown)   SpO2 97%   BMI 24.87 kg/m  , BMI Body mass index is 24.87 kg/m. GEN: Well nourished, well developed, in no acute distress  HEENT: normal  Neck: no JVD, carotid bruits, or masses Cardiac: RRR; no murmurs, rubs, or gallops,no edema  Respiratory:  clear to auscultation bilaterally, normal work of breathing GI: soft, nontender, nondistended, + BS MS: no deformity or atrophy  Skin: warm and dry, device site well healed Neuro:  Strength and sensation are intact Psych: euthymic mood, full affect  EKG:  EKG is ordered today. Personal review of the ekg ordered shows A paced  Personal review of the device interrogation today. Results in [place]: 05/10/2017: Hemoglobin 11.4; Platelets 196.0 10/09/2017: BUN 21; Creatinine, Ser 0.87;  Potassium 4.1; Sodium 141; TSH 0.42    Lipid Panel     Component Value Date/Time   CHOL 132 03/10/2017 0457   TRIG 61 03/10/2017 0457   HDL 56 03/10/2017 0457   CHOLHDL 2.4 03/10/2017 0457   VLDL 12 03/10/2017 0457   LDLCALC 64 03/10/2017 0457     Wt Readings from Last 3 Encounters:  01/06/18 136 lb (61.7 kg)  10/09/17 136 lb 2 oz (61.7 kg)  07/10/17 137 lb (62.1 kg)      Other studies Reviewed: Additional studies/ records that were reviewed today include:  TTE 12/25/15 - Left ventricle: The cavity size was normal. Systolic function was   normal. The estimated ejection fraction was in the range of 55%   to 60%. Wall motion was normal; there were no regional wall   motion abnormalities. The study is not technically sufficient to   allow evaluation of LV diastolic function. - Aortic valve: Transvalvular velocity was  within the normal range.   There was no stenosis. There was mild to moderate regurgitation. - Mitral valve: There was no regurgitation. - Left atrium: The atrium was severely dilated. - Right ventricle: The cavity size was normal. Wall thickness was   normal. Systolic function was normal. - Right atrium: The atrium was severely dilated. - Tricuspid valve: There was mild regurgitation. - Pulmonary arteries: Systolic pressure was within the normal   range. PA peak pressure: 23 mm Hg (S).   Cardiac cath 12/26/15   Prox RCA to Dist RCA lesion, 30% stenosed.  Prox LAD lesion, 40% stenosed.  Mid LAD to Dist LAD lesion, 45% stenosed.  Ost Cx to Prox Cx lesion, 10% stenosed. The lesion was previously treated with a drug-eluting stent greater than two years ago. The left ventricular systolic function is normal.   ASSESSMENT AND PLAN:  1.  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  She did have blunted histograms and thus rate response was made more aggressive.  No other changes.  2. Paroxysmal atrial fibrillation: Currently on  Xarelto and Multitak.  She has been taking her Multitak every day and has no longer had issues with diarrhea.  She is feeling well and has had minimal palpitations.  No changes.  This patients CHA2DS2-VASc Score and unadjusted Ischemic Stroke Rate (% per year) is equal to 7.2 % stroke rate/year from a score of 5  Above score calculated as 1 point each if present [CHF, HTN, DM, Vascular=MI/PAD/Aortic Plaque, Age if 65-74, or Female] Above score calculated as 2 points each if present [Age > 75, or Stroke/TIA/TE]  3. Hypertension: Well-controlled today.  No changes.  4. Coronary artery disease: No current chest pain.  Current medicines are reviewed at length with the patient today.   The patient does not have concerns regarding her medicines.  The following changes were made today: None  Labs/ tests ordered today include:  Orders Placed This Encounter  Procedures  . EKG 12-Lead     Disposition:   FU with Alva Kuenzel 6 months  Signed, Evoleht Hovatter [name], [name]  01/06/2018 10:35 AM     [facility] [address] [place] [address] [phone] (office) [phone] (fax) That helps she in with burning

## 2018-01-06 ENCOUNTER — Encounter (INDEPENDENT_AMBULATORY_CARE_PROVIDER_SITE_OTHER): Payer: Self-pay

## 2018-01-06 ENCOUNTER — Encounter: Payer: Self-pay | Admitting: Cardiology

## 2018-01-06 ENCOUNTER — Ambulatory Visit (INDEPENDENT_AMBULATORY_CARE_PROVIDER_SITE_OTHER): Payer: Medicare Other | Admitting: Cardiology

## 2018-01-06 VITALS — BP 140/64 | HR 73 | Ht 62.0 in | Wt 136.0 lb

## 2018-01-06 DIAGNOSIS — I2510 Atherosclerotic heart disease of native coronary artery without angina pectoris: Secondary | ICD-10-CM

## 2018-01-06 DIAGNOSIS — I480 Paroxysmal atrial fibrillation: Secondary | ICD-10-CM | POA: Diagnosis not present

## 2018-01-06 DIAGNOSIS — I495 Sick sinus syndrome: Secondary | ICD-10-CM

## 2018-01-06 DIAGNOSIS — I10 Essential (primary) hypertension: Secondary | ICD-10-CM

## 2018-01-06 NOTE — Patient Instructions (Addendum)
Medication Instructions:  Your physician recommends that you continue on your current medications as directed. Please refer to the Current Medication list given to you today.  Labwork: None ordered  Testing/Procedures: None ordered  Follow-Up: Remote monitoring is used to monitor your Pacemaker of ICD from home. This monitoring reduces the number of office visits required to check your device to one time per year. It allows [place] to keep an eye on the functioning of your device to ensure it is working properly. You are scheduled for a device check from home on 02/03/2018. You may send your transmission at any time that day. If you have a wireless device, the transmission will be sent automatically. After your physician reviews your transmission, you will receive a postcard with your next transmission date.  Your physician wants you to follow-up in: 6 months with Dr. Curt Bears.  You will receive a reminder letter in the mail two months in advance. If you don't receive a letter, please call our office to schedule the follow-up appointment.  * If you need a refill on your cardiac medications before your next appointment, please call your pharmacy.   *Please note that any paperwork needing to be filled out by the provider will need to be addressed at the front desk prior to seeing the provider. Please note that any FMLA, disability or other documents regarding health condition is subject to a $25.00 charge that must be received prior to completion of paperwork in the form of a money order or check.  Thank you for choosing [facility]!!   Trinidad Curet, RN [phone]

## 2018-01-07 LAB — CUP PACEART INCLINIC DEVICE CHECK
Date Time Interrogation Session: 20190409133257
Implantable Lead Implant Date: 19990610
Implantable Lead Implant Date: 20160713
Implantable Lead Location: 753860
Implantable Lead Serial Number: 29813363
Implantable Pulse Generator Implant Date: 20150130
MDC IDC LEAD LOCATION: 753859
Pulse Gen Serial Number: 393477

## 2018-01-11 ENCOUNTER — Other Ambulatory Visit: Payer: Self-pay | Admitting: Interventional Cardiology

## 2018-01-13 NOTE — Telephone Encounter (Signed)
Xarelto 15mg  refill request received; pt is 83 yrs old, wt-61.7kg, Crea-0.87 on 10/09/17, last seen by Dr. Curt Bears on 01/06/18, CrCl-50.25ml/min; will send in refill to requested pharmacy at this time.

## 2018-01-17 ENCOUNTER — Other Ambulatory Visit: Payer: Self-pay | Admitting: Family Medicine

## 2018-01-22 ENCOUNTER — Other Ambulatory Visit: Payer: Self-pay | Admitting: Family Medicine

## 2018-02-02 ENCOUNTER — Other Ambulatory Visit: Payer: Self-pay | Admitting: Cardiology

## 2018-02-03 ENCOUNTER — Ambulatory Visit (INDEPENDENT_AMBULATORY_CARE_PROVIDER_SITE_OTHER): Payer: Medicare Other | Admitting: *Deleted

## 2018-02-03 DIAGNOSIS — I495 Sick sinus syndrome: Secondary | ICD-10-CM

## 2018-02-03 NOTE — Progress Notes (Signed)
Remote pacemaker transmission.   

## 2018-02-05 ENCOUNTER — Encounter: Payer: Self-pay | Admitting: Cardiology

## 2018-02-05 NOTE — Progress Notes (Signed)
Letter  

## 2018-02-17 LAB — CUP PACEART REMOTE DEVICE CHECK
Implantable Lead Implant Date: 20160713
Implantable Lead Model: 4136
Implantable Pulse Generator Implant Date: 20150130
Lead Channel Impedance Value: 679 Ohm
Lead Channel Pacing Threshold Amplitude: 0.7 V
Lead Channel Pacing Threshold Pulse Width: 0.4 ms
Lead Channel Setting Pacing Amplitude: 1.2 V
Lead Channel Setting Sensing Sensitivity: 2 mV
MDC IDC LEAD IMPLANT DT: 19990610
MDC IDC LEAD LOCATION: 753859
MDC IDC LEAD LOCATION: 753860
MDC IDC LEAD SERIAL: 29813363
MDC IDC MSMT BATTERY REMAINING LONGEVITY: 42 mo
MDC IDC MSMT BATTERY REMAINING PERCENTAGE: 70 %
MDC IDC MSMT LEADCHNL RA IMPEDANCE VALUE: 327 Ohm
MDC IDC MSMT LEADCHNL RA PACING THRESHOLD AMPLITUDE: 1.3 V
MDC IDC MSMT LEADCHNL RA PACING THRESHOLD PULSEWIDTH: 0.5 ms
MDC IDC PG SERIAL: 393477
MDC IDC SESS DTM: 20190506061100
MDC IDC SET LEADCHNL RA PACING AMPLITUDE: 2 V
MDC IDC SET LEADCHNL RV PACING PULSEWIDTH: 0.4 ms
MDC IDC STAT BRADY RA PERCENT PACED: 97 %
MDC IDC STAT BRADY RV PERCENT PACED: 10 %

## 2018-02-27 ENCOUNTER — Ambulatory Visit: Payer: Medicare Other | Admitting: Family Medicine

## 2018-02-28 ENCOUNTER — Encounter: Payer: Self-pay | Admitting: Family Medicine

## 2018-02-28 ENCOUNTER — Ambulatory Visit (INDEPENDENT_AMBULATORY_CARE_PROVIDER_SITE_OTHER): Payer: Medicare Other | Admitting: Family Medicine

## 2018-02-28 VITALS — BP 140/83 | HR 74 | Temp 98.6°F | Resp 16 | Ht 62.0 in | Wt 134.1 lb

## 2018-02-28 DIAGNOSIS — I480 Paroxysmal atrial fibrillation: Secondary | ICD-10-CM

## 2018-02-28 DIAGNOSIS — Z7901 Long term (current) use of anticoagulants: Secondary | ICD-10-CM

## 2018-02-28 DIAGNOSIS — J310 Chronic rhinitis: Secondary | ICD-10-CM

## 2018-02-28 DIAGNOSIS — J42 Unspecified chronic bronchitis: Secondary | ICD-10-CM

## 2018-02-28 DIAGNOSIS — R040 Epistaxis: Secondary | ICD-10-CM | POA: Diagnosis not present

## 2018-02-28 LAB — CBC
HEMATOCRIT: 35.2 % — AB (ref 36.0–46.0)
Hemoglobin: 11.7 g/dL — ABNORMAL LOW (ref 12.0–15.0)
MCHC: 33.1 g/dL (ref 30.0–36.0)
MCV: 86.7 fl (ref 78.0–100.0)
PLATELETS: 190 10*3/uL (ref 150.0–400.0)
RBC: 4.06 Mil/uL (ref 3.87–5.11)
RDW: 14.8 % (ref 11.5–15.5)
WBC: 6.5 10*3/uL (ref 4.0–10.5)

## 2018-02-28 MED ORDER — IPRATROPIUM BROMIDE 0.03 % NA SOLN: 2.0000 | Freq: Two times a day (BID) | NASAL | 2 refills | Status: DC

## 2018-02-28 NOTE — Patient Instructions (Addendum)
A few things to remember from today's visit:   Epistaxis, recurrent - Plan: CBC  Current use of long term anticoagulation  Paroxysmal atrial fibrillation (HCC)  Chronic bronchitis, unspecified chronic bronchitis type ([place])  Chronic rhinitis  For now recommend continue Xarelto. Avoid blowing her nose. Do not use Flonase. Nasal irrigation with saline may help. If problem continues we can consider sending her to ENT.  Nosebleed, Adult A nosebleed is when blood comes out of the nose. Nosebleeds are common. Usually, they are not a sign of a serious condition. Nosebleeds can happen if a small blood vessel in your nose starts to bleed or if the lining of your nose (mucous membrane) cracks. They are commonly caused by:  Allergies.  Colds.  Picking your nose.  Blowing your nose too hard.  An injury from sticking an object into your nose or getting hit in the nose.  Dry or cold air.  Less common causes of nosebleeds include:  Toxic fumes.  Something abnormal in the nose or in the air-filled spaces in the bones of the face (sinuses).  Growths in the nose, such as polyps.  Medicines or conditions that cause blood to clot slowly.  Certain illnesses or procedures that irritate or dry out the nasal passages.  Follow these instructions at home: When you have a nosebleed:  Sit down and tilt your head slightly forward.  Use a clean towel or tissue to pinch your nostrils under the bony part of your nose. After 10 minutes, let go of your nose and see if bleeding starts again. Do not release pressure before that time. If there is still bleeding, repeat the pinching and holding for 10 minutes until the bleeding stops.  Do not place tissues or gauze in the nose to stop bleeding.  Avoid lying down and avoid tilting your head backward. That may make blood collect in the throat and cause gagging or coughing.  Use a nasal spray decongestant to help with a nosebleed as told by your  health care provider.  Do not use petroleum jelly or mineral oil in your nose. It can drip into your lungs. After a nosebleed:  Avoid blowing your nose or sniffing for a number of hours.  Avoid straining, lifting, or bending at the waist for several days. You may resume other normal activities as you are able.  Use saline spray or a humidifier as told by your health care provider.  Aspirinand blood thinners make bleeding more likely. If you are prescribed these medicines and you suffer from nosebleeds: ? Ask your health care provider if you should stop taking the medicines or if you should adjust the dose. ? Do not stop taking medicines that your health care provider has recommended unless told by your health care provider.  If your nosebleed was caused by dry mucous membranes, use over-the-counter saline nasal spray or gel. This will keep the mucous membranes moist and allow them to heal. If you must use a lubricant: ? Choose one that is water-soluble. ? Use only as much as you need and use it only as often as needed. ? Do not lie down until several hours after you use it. Contact a health care provider if:  You have a fever.  You get nosebleeds often or more often than usual.  You bruise very easily.  You have a nosebleed from having something stuck in your nose.  You have bleeding in your mouth.  You vomit or cough up brown material.  You  have a nosebleed after you start a new medicine. Get help right away if:  You have a nosebleed after a fall or a head injury.  Your nosebleed does not go away after 20 minutes.  You feel dizzy or weak.  You have unusual bleeding from other parts of your body.  You have unusual bruising on other parts of your body.  You become sweaty.  You vomit blood. This information is not intended to replace advice given to you by your health care provider. Make sure you discuss any questions you have with your health care provider. Document  Released: 06/27/2005 Document Revised: 05/17/2016 Document Reviewed: 04/03/2016 Elsevier Interactive Patient Education  [employer].  Please be sure medication list is accurate. If a new problem present, please set up appointment sooner than planned today.

## 2018-02-28 NOTE — Progress Notes (Signed)
ACUTE VISIT   HPI:  Chief Complaint  Patient presents with  . Epistaxis    started one 1 week ago, had one 3 times since last week    [name] is a 83 y.o. female, who is here today with her daughter complaining of nose bleed for the past week. She has had 3 episodes of spontaneous nose bleed,firts time she woke up and noted blood on pillow.  Bleeding last a bout 5-15 min,she pinches nose for a few minutes.  She has not noted gum bleed,easy bruising,gross hematuria,or blood in stool.  Denies severe headache,reports "slight" parietal headache a couple times. No associated nausea,vomitong,or visual changes.   History of paroxysmal atrial fibrillation and CAD, she follows with cardiologist. She is currently on Xarelto 15 mg daily,she did not take it yesterday and asking if she can discontinue it.  She had nose bleed frequently when she was on Coumadin.   Rhinitis. She has not used Flonase nasal spray lately. + Rhinorrhea and post nasal drainage. + Sneezing.   Lab Results  Component Value Date   WBC 5.6 05/10/2017   HGB 11.4 (L) 05/10/2017   HCT 36.3 05/10/2017   MCV 89.3 05/10/2017   PLT 196.0 05/10/2017   + Smoker. Noted wheezing on examination.  Hx of COPD. She has not used her Albuterol inh. Negative for cough or dyspnea.    Review of Systems  Constitutional: Negative for chills and fever.  HENT: Positive for nosebleeds, postnasal drip and rhinorrhea. Negative for ear pain, facial swelling and sore throat.   Respiratory: Positive for wheezing. Negative for cough and shortness of breath.   Cardiovascular: Negative for leg swelling.  Gastrointestinal: Negative for abdominal pain, blood in stool, nausea and vomiting.  Genitourinary: Negative for decreased urine volume and hematuria.  Musculoskeletal: Negative for joint swelling and myalgias.  Skin: Negative for rash and wound.  Allergic/Immunologic: Positive for environmental allergies.    Neurological: Positive for headaches. Negative for syncope and weakness.  Hematological: Negative for adenopathy. Does not bruise/bleed easily.      Current Outpatient Medications on File Prior to Visit  Medication Sig Dispense Refill  . albuterol (PROVENTIL HFA;VENTOLIN HFA) 108 (90 Base) MCG/ACT inhaler Inhale 2 puffs into the lungs every 6 (six) hours as needed for wheezing or shortness of breath. 1 Inhaler 1  . atorvastatin (LIPITOR) 10 MG tablet TAKE 1 TABLET BY MOUTH DAILY 90 tablet 3  . carvedilol (COREG) 12.5 MG tablet TAKE 1 TABLET (12.5 MG TOTAL) BY MOUTH 2 (TWO) TIMES DAILY WITH A MEAL. 180 tablet 2  . dronedarone (MULTAQ) 400 MG tablet Take 1 tablet (400 mg total) by mouth 2 (two) times daily with a meal. 30 tablet 6  . esomeprazole (NEXIUM) 40 MG capsule TAKE ONE CAPSULE EVERY DAY 90 capsule 1  . fenofibrate (TRICOR) 48 MG tablet TAKE 1 TABLET EVERY DAY 90 tablet 3  . fluticasone (FLONASE) 50 MCG/ACT nasal spray Place 1 spray into both nostrils 2 (two) times daily. 16 g 3  . glipiZIDE (GLUCOTROL) 5 MG tablet Take 1 tablet (5 mg total) by mouth daily before breakfast. 20-30 minutes before meal. 90 tablet 1  . glucose blood test strip Use as instructed 100 each 12  . levothyroxine (SYNTHROID, LEVOTHROID) 112 MCG tablet Take 1 tablet (112 mcg total) by mouth daily before breakfast. 90 tablet 2  . linagliptin (TRADJENTA) 5 MG TABS tablet TAKE 1 TABLET (5 MG TOTAL) DAILY BY MOUTH. 90 tablet 2  .  losartan (COZAAR) 100 MG tablet Take 1 tablet (100 mg total) by mouth daily. 90 tablet 1  . metFORMIN (GLUCOPHAGE) 500 MG tablet Take 1 tablet (500 mg total) by mouth every evening. 90 tablet 1  . nicotine (NICODERM CQ - DOSED IN MG/24 HOURS) 14 mg/24hr patch Place 1 patch (14 mg total) onto the skin daily. 60 patch 0  . nitroGLYCERIN (NITROSTAT) 0.4 MG SL tablet Place 0.4 mg under the tongue every 5 (five) minutes x 3 doses as needed for chest pain (Max 3 doses within 15 min. Call 911).    Alveda Reasons 15 MG TABS tablet TAKE 1 TABLET (15 MG TOTAL) BY MOUTH DAILY WITH SUPPER. 30 tablet 5   No current facility-administered medications on file prior to visit.      Past Medical History:  Diagnosis Date  . CAD (coronary artery disease), native coronary artery    PTCA of distal right coronary artery 1997 Cypher stents to circumflex 2005 Cardiac cath in 2011 with patent stent to circumflex and moderate disease elsewhere treated medically   . Cardiac pacemaker in situ 12/24/2015   Original implant reportedly in 1991 for tachybradycardia syndrome, generator change in 1999, 2004 and evidently again in 2016 in [place]   . COPD (chronic obstructive pulmonary disease) ([place])   . Diabetes mellitus without complication ([place])   . Hypertension   . Hypothyroidism 03/23/2010  . Pacemaker   . Paroxysmal atrial fibrillation ([place]) 03/23/2010   CHA2DS2VASC score 5    No Known Allergies  Social History   Socioeconomic History  . Marital status: Widowed    Spouse name: Not on file  . Number of children: Not on file  . Years of education: Not on file  . Highest education level: Not on file  Occupational History  . Not on file  Social Needs  . Financial resource strain: Not on file  . Food insecurity:    Worry: Not on file    Inability: Not on file  . Transportation needs:    Medical: Not on file    Non-medical: Not on file  Tobacco Use  . Smoking status: Current Every Day Smoker    Packs/day: 0.50  . Smokeless tobacco: Never Used  Substance and Sexual Activity  . Alcohol use: No    Alcohol/week: 0.0 oz  . Drug use: No  . Sexual activity: Not on file  Lifestyle  . Physical activity:    Days per week: Not on file    Minutes per session: Not on file  . Stress: Not on file  Relationships  . Social connections:    Talks on phone: Not on file    Gets together: Not on file    Attends religious service: Not on file    Active member of club or organization: Not on file    Attends  meetings of clubs or organizations: Not on file    Relationship status: Not on file  Other Topics Concern  . Not on file  Social History Narrative  . Not on file    Vitals:   02/28/18 0709  BP: 140/83  Pulse: 74  Resp: 16  Temp: 98.6 F (37 C)  SpO2: 96%   Body mass index is 24.53 kg/m.   Physical Exam  Nursing note and vitals reviewed. Constitutional: She is oriented to person, place, and time. She appears well-developed and well-nourished. No distress.  HENT:  Head: Normocephalic and atraumatic.  Nose: Rhinorrhea present. No mucosal edema or sinus tenderness.  No epistaxis.  No foreign bodies. Right sinus exhibits no maxillary sinus tenderness and no frontal sinus tenderness. Left sinus exhibits no maxillary sinus tenderness and no frontal sinus tenderness.  Mouth/Throat: Oropharynx is clear and moist and mucous membranes are normal.  Eyes: Pupils are equal, round, and reactive to light. Conjunctivae are normal.  Cardiovascular: Normal rate and regular rhythm.  No murmur heard. Pulses:      Dorsalis pedis pulses are 2+ on the right side, and 2+ on the left side.  Respiratory: Effort normal. No respiratory distress. She has wheezes.  GI: Soft.  Musculoskeletal: She exhibits no edema.  Lymphadenopathy:    She has no cervical adenopathy.  Neurological: She is alert and oriented to person, place, and time. She has normal strength.  Skin: Skin is warm. No erythema.  Psychiatric: She has a normal mood and affect.  Well groomed, good eye contact.    ASSESSMENT AND PLAN:  [name] was seen today for epistaxis.  Diagnoses and all orders for this visit:  Lab Results  Component Value Date   WBC 6.5 02/28/2018   HGB 11.7 (L) 02/28/2018   HCT 35.2 (L) 02/28/2018   MCV 86.7 02/28/2018   PLT 190.0 02/28/2018    Epistaxis, recurrent  No signs of active or recent bleeding appreciated. Possible causes discussed,including rhinitis and medications. Avoid blowing nose. If  persistent ENT evaluation could be considered.  -     CBC  Current use of long term anticoagulation  Side effects of Xarelto discussed. No changes for now.  Paroxysmal atrial fibrillation (HCC)  SR today. She would like to discontinue Xarelto. Educated about benefits of anticoagulation as well as risk,at this time I recommend continuing Xarelto. Continue following with cardiologist.  Chronic bronchitis, unspecified chronic bronchitis type ([place])  Albuterol inh 2 puff every 6 hours for a week then as needed for wheezing or shortness of breath.  Encouraged smoking cessation. Instructed about warning signs.  Chronic rhinitis -     ipratropium (ATROVENT) 0.03 % nasal spray; Place 2 sprays into both nostrils every 12 (twelve) hours.    Return in about 2 months (around 04/30/2018) for COPD,nose bleed.      Shanikka Wonders G. [name], MD  [facility]. [place] office.

## 2018-03-04 ENCOUNTER — Other Ambulatory Visit: Payer: Self-pay | Admitting: Family Medicine

## 2018-03-04 DIAGNOSIS — R040 Epistaxis: Secondary | ICD-10-CM

## 2018-03-10 ENCOUNTER — Other Ambulatory Visit: Payer: Self-pay | Admitting: Cardiology

## 2018-03-20 DIAGNOSIS — R040 Epistaxis: Secondary | ICD-10-CM | POA: Diagnosis not present

## 2018-04-01 ENCOUNTER — Other Ambulatory Visit: Payer: Self-pay | Admitting: Family Medicine

## 2018-04-01 DIAGNOSIS — E119 Type 2 diabetes mellitus without complications: Secondary | ICD-10-CM

## 2018-04-07 ENCOUNTER — Encounter: Payer: Self-pay | Admitting: Interventional Cardiology

## 2018-04-08 ENCOUNTER — Ambulatory Visit: Payer: Medicare Other | Admitting: Family Medicine

## 2018-04-08 DIAGNOSIS — Z0289 Encounter for other administrative examinations: Secondary | ICD-10-CM

## 2018-04-08 NOTE — Progress Notes (Deleted)
HPI:   [name] is a 83 y.o. female, who is here today for *** months follow up.   *** was last seen on ***  Since *** last OV she has ***  Diabetes Mellitus II:    Currently on Tradjenta 5 mg daily, metformin 500 mg daily, And glipizide 5 mg daily..  Checking BS's : *** Hypoglycemia:*** Last eye exam 2018 ***  *** is tolerating medications well. *** denies abdominal pain, nausea, vomiting, polydipsia, polyuria, or polyphagia. No numbness, tingling, or burning.    Lab Results  Component Value Date   HGBA1C 6.9 (H) 10/09/2017   Lab Results  Component Value Date   MICROALBUR 1.3 10/09/2017    Hyperlipidemia:  Currently on atorvastatin 10 mg daily. Following a low fat diet: ***.  *** has not noted side effects with medication.  Lab Results  Component Value Date   CHOL 132 03/10/2017   HDL 56 03/10/2017   LDLCALC 64 03/10/2017   TRIG 61 03/10/2017   CHOLHDL 2.4 03/10/2017     Hypertension:   Currently on carvedilol 12.5 mg twice daily and Cozaar 100 mg daily. Atrial fibrillation, she follows with cardiologist.  Currently she is on Xarelto 50 mg daily and Multaq 400 mg daily.   ***taking medications as instructed, no side effects reported.  ***has not noted unusual headache, visual changes, exertional chest pain, dyspnea,  focal weakness, or edema.   Lab Results  Component Value Date   CREATININE 0.87 10/09/2017   BUN 21 10/09/2017   NA 141 10/09/2017   K 4.1 10/09/2017   CL 105 10/09/2017   CO2 29 10/09/2017     COPD:  {4+ HPI elements (or status of 3 or more chronic diseases)} ***      Review of Systems  [review of 2 to 9 systems] ***  Current Outpatient Medications on File Prior to Visit  Medication Sig Dispense Refill  . albuterol (PROVENTIL HFA;VENTOLIN HFA) 108 (90 Base) MCG/ACT inhaler Inhale 2 puffs into the lungs every 6 (six) hours as needed for wheezing or shortness of breath. 1 Inhaler 1  . atorvastatin  (LIPITOR) 10 MG tablet TAKE 1 TABLET BY MOUTH DAILY 90 tablet 3  . carvedilol (COREG) 12.5 MG tablet TAKE 1 TABLET (12.5 MG TOTAL) BY MOUTH 2 (TWO) TIMES DAILY WITH A MEAL. 180 tablet 2  . esomeprazole (NEXIUM) 40 MG capsule TAKE ONE CAPSULE EVERY DAY 90 capsule 1  . fenofibrate (TRICOR) 48 MG tablet TAKE 1 TABLET EVERY DAY 90 tablet 3  . fluticasone (FLONASE) 50 MCG/ACT nasal spray Place 1 spray into both nostrils 2 (two) times daily. 16 g 3  . glipiZIDE (GLUCOTROL) 5 MG tablet TAKE 1 TABLET (5 MG TOTAL) BY MOUTH DAILY BEFORE BREAKFAST. 20-30 MINUTES BEFORE MEAL. 90 tablet 3  . glucose blood test strip Use as instructed 100 each 12  . ipratropium (ATROVENT) 0.03 % nasal spray Place 2 sprays into both nostrils every 12 (twelve) hours. 30 mL 2  . levothyroxine (SYNTHROID, LEVOTHROID) 112 MCG tablet Take 1 tablet (112 mcg total) by mouth daily before breakfast. 90 tablet 2  . linagliptin (TRADJENTA) 5 MG TABS tablet TAKE 1 TABLET (5 MG TOTAL) DAILY BY MOUTH. 90 tablet 2  . losartan (COZAAR) 100 MG tablet Take 1 tablet (100 mg total) by mouth daily. 90 tablet 1  . metFORMIN (GLUCOPHAGE) 500 MG tablet Take 1 tablet (500 mg total) by mouth every evening. 90 tablet 1  . MULTAQ 400 MG  tablet TAKE 1 TABLET (400 MG TOTAL) BY MOUTH 2 (TWO) TIMES DAILY WITH A MEAL. 180 tablet 3  . nicotine (NICODERM CQ - DOSED IN MG/24 HOURS) 14 mg/24hr patch Place 1 patch (14 mg total) onto the skin daily. 60 patch 0  . nitroGLYCERIN (NITROSTAT) 0.4 MG SL tablet Place 0.4 mg under the tongue every 5 (five) minutes x 3 doses as needed for chest pain (Max 3 doses within 15 min. Call 911).    Alveda Reasons 15 MG TABS tablet TAKE 1 TABLET (15 MG TOTAL) BY MOUTH DAILY WITH SUPPER. 30 tablet 5   No current facility-administered medications on file prior to visit.      Past Medical History:  Diagnosis Date  . CAD (coronary artery disease), native coronary artery    PTCA of distal right coronary artery 1997 Cypher stents to  circumflex 2005 Cardiac cath in 2011 with patent stent to circumflex and moderate disease elsewhere treated medically   . Cardiac pacemaker in situ 12/24/2015   Original implant reportedly in 1991 for tachybradycardia syndrome, generator change in 1999, 2004 and evidently again in 2016 in [place]   . COPD (chronic obstructive pulmonary disease) ([place])   . Diabetes mellitus without complication ([place])   . Hypertension   . Hypothyroidism 03/23/2010  . Pacemaker   . Paroxysmal atrial fibrillation ([place]) 03/23/2010   CHA2DS2VASC score 5    No Known Allergies  Social History   Socioeconomic History  . Marital status: Widowed    Spouse name: Not on file  . Number of children: Not on file  . Years of education: Not on file  . Highest education level: Not on file  Occupational History  . Not on file  Social Needs  . Financial resource strain: Not on file  . Food insecurity:    Worry: Not on file    Inability: Not on file  . Transportation needs:    Medical: Not on file    Non-medical: Not on file  Tobacco Use  . Smoking status: Current Every Day Smoker    Packs/day: 0.50  . Smokeless tobacco: Never Used  Substance and Sexual Activity  . Alcohol use: No    Alcohol/week: 0.0 oz  . Drug use: No  . Sexual activity: Not on file  Lifestyle  . Physical activity:    Days per week: Not on file    Minutes per session: Not on file  . Stress: Not on file  Relationships  . Social connections:    Talks on phone: Not on file    Gets together: Not on file    Attends religious service: Not on file    Active member of club or organization: Not on file    Attends meetings of clubs or organizations: Not on file    Relationship status: Not on file  Other Topics Concern  . Not on file  Social History Narrative  . Not on file    There were no vitals filed for this visit. There is no height or weight on file to calculate BMI.      Physical Exam  {[12+ exam elements]}  ***  ASSESSMENT AND PLAN:   Ms. [name] was seen today for *** months follow-up.  No orders of the defined types were placed in this encounter.   No problem-specific Assessment & Plan notes found for this encounter.           -Ms. [name] was advised to return sooner than planned today  if new concerns arise.       [name]. [name], MD  [facility]. [place] office.

## 2018-04-20 NOTE — Progress Notes (Signed)
Cardiology Office Note:    Date:  04/22/2018   ID:  [name], DOB 1934-10-14, MRN [id number]  PCP:  [name], [name], MD  Cardiologist:  No primary care provider on file.   Referring MD: [name], [name], MD   Chief Complaint  Patient presents with  . Atrial Fibrillation  . Coronary Artery Disease  . COPD    History of Present Illness:    [name] is a 83 y.o. female with a hx of tachycardia-bradycardia syndrome, permanent pacemaker, coronary artery disease with prior right coronary drug-eluting stent as well as circumflex cipher stent 2005. History of COPD, continue smoking, diabetes, hypertension, and paroxysmal atrial fibrillation. Currently on anticoagulation and Multaq related to atrial fib.  She is doing well.  She has not had prolonged palpitations.  She denies angina or nitroglycerin use.  She continues to smoke cigarettes on a daily basis up to between 6 to 9/day.  She denies hemoptysis.  She has no orthopnea or lower extremity swelling.   Past Medical History:  Diagnosis Date  . CAD (coronary artery disease), native coronary artery    PTCA of distal right coronary artery 1997 Cypher stents to circumflex 2005 Cardiac cath in 2011 with patent stent to circumflex and moderate disease elsewhere treated medically   . Cardiac pacemaker in situ 12/24/2015   Original implant reportedly in 1991 for tachybradycardia syndrome, generator change in 1999, 2004 and evidently again in 2016 in [place]   . COPD (chronic obstructive pulmonary disease) ([place])   . Diabetes mellitus without complication ([place])   . Hypertension   . Hypothyroidism 03/23/2010  . Pacemaker   . Paroxysmal atrial fibrillation ([place]) 03/23/2010   CHA2DS2VASC score 5     Past Surgical History:  Procedure Laterality Date  . CARDIAC CATHETERIZATION N/A 12/26/2015   Procedure: Left Heart Cath and Coronary Angiography;  Surgeon: Peter M [name], MD;  Location: [facility];  Service: Cardiovascular;   Laterality: N/A;  . CARDIOVERSION  05/2015  . CHOLECYSTECTOMY  2012  . CORONARY ANGIOPLASTY WITH STENT PLACEMENT  1990  . HERNIA REPAIR    . PACEMAKER GENERATOR CHANGE  [id number], 2016  . PACEMAKER INSERTION  1991    Current Medications: Current Meds  Medication Sig  . albuterol (PROVENTIL HFA;VENTOLIN HFA) 108 (90 Base) MCG/ACT inhaler Inhale 2 puffs into the lungs every 6 (six) hours as needed for wheezing or shortness of breath.  Marland Kitchen atorvastatin (LIPITOR) 10 MG tablet TAKE 1 TABLET BY MOUTH DAILY  . carvedilol (COREG) 12.5 MG tablet TAKE 1 TABLET (12.5 MG TOTAL) BY MOUTH 2 (TWO) TIMES DAILY WITH A MEAL.  Marland Kitchen dronedarone (MULTAQ) 400 MG tablet Take 400 mg by mouth daily.  Marland Kitchen esomeprazole (NEXIUM) 40 MG capsule Take 40 mg by mouth daily at 12 noon.  . fenofibrate (TRICOR) 48 MG tablet Take 48 mg by mouth daily.  Marland Kitchen glipiZIDE (GLUCOTROL) 5 MG tablet TAKE 1 TABLET (5 MG TOTAL) BY MOUTH DAILY BEFORE BREAKFAST. 20-30 MINUTES BEFORE MEAL.  Marland Kitchen glucose blood test strip Use as instructed  . levothyroxine (SYNTHROID, LEVOTHROID) 112 MCG tablet Take 1 tablet (112 mcg total) by mouth daily before breakfast.  . linagliptin (TRADJENTA) 5 MG TABS tablet TAKE 1 TABLET (5 MG TOTAL) DAILY BY MOUTH.  Marland Kitchen losartan (COZAAR) 100 MG tablet Take 1 tablet (100 mg total) by mouth daily.  . metFORMIN (GLUCOPHAGE) 500 MG tablet Take 1 tablet (500 mg total) by mouth every evening.  . nitroGLYCERIN (NITROSTAT) 0.4 MG SL tablet  Place 0.4 mg under the tongue every 5 (five) minutes x 3 doses as needed for chest pain (Max 3 doses within 15 min. Call 911).  Alveda Reasons 15 MG TABS tablet TAKE 1 TABLET (15 MG TOTAL) BY MOUTH DAILY WITH SUPPER.     Allergies:   Patient has no known allergies.   Social History   Socioeconomic History  . Marital status: Widowed    Spouse name: Not on file  . Number of children: Not on file  . Years of education: Not on file  . Highest education level: Not on file  Occupational History  . Not  on file  Social Needs  . Financial resource strain: Not on file  . Food insecurity:    Worry: Not on file    Inability: Not on file  . Transportation needs:    Medical: Not on file    Non-medical: Not on file  Tobacco Use  . Smoking status: Current Every Day Smoker    Packs/day: 0.50  . Smokeless tobacco: Never Used  Substance and Sexual Activity  . Alcohol use: No    Alcohol/week: 0.0 oz  . Drug use: No  . Sexual activity: Not on file  Lifestyle  . Physical activity:    Days per week: Not on file    Minutes per session: Not on file  . Stress: Not on file  Relationships  . Social connections:    Talks on phone: Not on file    Gets together: Not on file    Attends religious service: Not on file    Active member of club or organization: Not on file    Attends meetings of clubs or organizations: Not on file    Relationship status: Not on file  Other Topics Concern  . Not on file  Social History Narrative  . Not on file     Family History: The patient's family history includes Cirrhosis in her brother; Heart disease in her father and mother. There is no history of Colon cancer or Stomach cancer.  ROS:   Please see the history of present illness.    None other than cough. All other systems reviewed and are negative.  EKGs/Labs/Other Studies Reviewed:    The following studies were reviewed today: No cardiac studies.  EKG:  EKG is not ordered today.    Recent Labs: 10/09/2017: BUN 21; Creatinine, Ser 0.87; Potassium 4.1; Sodium 141; TSH 0.42 02/28/2018: Hemoglobin 11.7; Platelets 190.0  Recent Lipid Panel    Component Value Date/Time   CHOL 132 03/10/2017 0457   TRIG 61 03/10/2017 0457   HDL 56 03/10/2017 0457   CHOLHDL 2.4 03/10/2017 0457   VLDL 12 03/10/2017 0457   LDLCALC 64 03/10/2017 0457    Physical Exam:    VS:  BP (!) 136/56   Pulse 70   Ht 5' 1.5" (1.562 m)   Wt 131 lb 12.8 oz (59.8 kg)   LMP  (LMP Unknown)   BMI 24.50 kg/m     Wt Readings  from Last 3 Encounters:  04/22/18 131 lb 12.8 oz (59.8 kg)  02/28/18 134 lb 2 oz (60.8 kg)  01/06/18 136 lb (61.7 kg)     GEN: Appears older than stated but well nourished, well developed in no acute distress HEENT: Normal NECK: No JVD. LYMPHATICS: No lymphadenopathy CARDIAC: RRR, no murmur, no gallop, no edema. VASCULAR: Diminished pedal pulses.  No carotid or femoral bruits. RESPIRATORY:  Clear to auscultation without rales, wheezing or rhonchi  ABDOMEN: Soft, non-tender, non-distended, No pulsatile mass, MUSCULOSKELETAL: No deformity  SKIN: Warm and dry NEUROLOGIC:  Alert and oriented x 3 PSYCHIATRIC:  Normal affect   ASSESSMENT:    1. Paroxysmal atrial fibrillation (HCC)   2. Hypertension with heart disease   3. Tobacco use disorder   4. Chronic bronchitis, unspecified chronic bronchitis type ([place])   5. Current use of long term anticoagulation   6. Coronary artery disease involving native coronary artery of native heart with angina pectoris ([place])   7. Cardiac pacemaker in situ   8. Hyperlipidemia LDL goal <70    PLAN:    In order of problems listed above:  1. Currently in sinus rhythm based upon exam.  She is on a suboptimal dose of Multitak taking only 400 mg once per day.  Twice daily dosing caused unacceptable diarrhea. 2. Excellent blood pressure control.  Target 140/90 mmHg or less. 3. Encouraged smoking cessation.  We have discussed this for years.  She is not likely to stop.  She does not bother to discuss a stop date. 4. Not discussed other than to acknowledge worsen as she continues to smoke. 5. Continue lower dose Xarelto 15 mg/day. 6. Stable without angina.  Again we discussed risk factor modification with smoking being a major point of discussion. 45. Being seen in device clinic with normal function. 8. LDL target is less than 70.  Most recently in June 2018 LDL was 64.  This is followed by Dr. [name] her primary physician.  General re-discussion of  risk factor modification and the impact it could have on her other underlying conditions.  Encouraged aerobic activity.  Encouraged smoking cessation.   Medication Adjustments/Labs and Tests Ordered: Current medicines are reviewed at length with the patient today.  Concerns regarding medicines are outlined above.  No orders of the defined types were placed in this encounter.  No orders of the defined types were placed in this encounter.   Patient Instructions  Medication Instructions:  Your physician recommends that you continue on your current medications as directed. Please refer to the Current Medication list given to you today.  Labwork: None  Testing/Procedures: None  Follow-Up: Your physician wants you to follow-up in: 6-8 Months with a NP or PA. You will receive a reminder letter in the mail two months in advance. If you don't receive a letter, please call our office to schedule the follow-up appointment.  If you need a refill on your cardiac medications before your next appointment, please call your pharmacy.      Signed, [name], MD  04/22/2018 10:48 AM    [place]

## 2018-04-22 ENCOUNTER — Ambulatory Visit (INDEPENDENT_AMBULATORY_CARE_PROVIDER_SITE_OTHER): Payer: Medicare Other | Admitting: Interventional Cardiology

## 2018-04-22 ENCOUNTER — Encounter: Payer: Self-pay | Admitting: Interventional Cardiology

## 2018-04-22 VITALS — BP 136/56 | HR 70 | Ht 61.5 in | Wt 131.8 lb

## 2018-04-22 DIAGNOSIS — Z7901 Long term (current) use of anticoagulants: Secondary | ICD-10-CM

## 2018-04-22 DIAGNOSIS — J42 Unspecified chronic bronchitis: Secondary | ICD-10-CM | POA: Diagnosis not present

## 2018-04-22 DIAGNOSIS — E785 Hyperlipidemia, unspecified: Secondary | ICD-10-CM

## 2018-04-22 DIAGNOSIS — F17200 Nicotine dependence, unspecified, uncomplicated: Secondary | ICD-10-CM | POA: Diagnosis not present

## 2018-04-22 DIAGNOSIS — I119 Hypertensive heart disease without heart failure: Secondary | ICD-10-CM

## 2018-04-22 DIAGNOSIS — I480 Paroxysmal atrial fibrillation: Secondary | ICD-10-CM

## 2018-04-22 DIAGNOSIS — I25119 Atherosclerotic heart disease of native coronary artery with unspecified angina pectoris: Secondary | ICD-10-CM

## 2018-04-22 DIAGNOSIS — Z950 Presence of cardiac pacemaker: Secondary | ICD-10-CM

## 2018-04-22 NOTE — Patient Instructions (Signed)
Medication Instructions:  Your physician recommends that you continue on your current medications as directed. Please refer to the Current Medication list given to you today.  Labwork: None  Testing/Procedures: None  Follow-Up: Your physician wants you to follow-up in: 6-8 Months with a NP or PA. You will receive a reminder letter in the mail two months in advance. If you don't receive a letter, please call our office to schedule the follow-up appointment.  If you need a refill on your cardiac medications before your next appointment, please call your pharmacy.

## 2018-04-30 ENCOUNTER — Other Ambulatory Visit: Payer: Self-pay | Admitting: Family Medicine

## 2018-05-05 ENCOUNTER — Ambulatory Visit (INDEPENDENT_AMBULATORY_CARE_PROVIDER_SITE_OTHER): Payer: Medicare Other | Admitting: *Deleted

## 2018-05-05 DIAGNOSIS — I495 Sick sinus syndrome: Secondary | ICD-10-CM

## 2018-05-05 NOTE — Progress Notes (Signed)
Remote pacemaker transmission.   

## 2018-05-28 LAB — CUP PACEART REMOTE DEVICE CHECK
Battery Remaining Longevity: 42 mo
Brady Statistic RV Percent Paced: 10 %
Date Time Interrogation Session: 20190805062400
Implantable Lead Implant Date: 19990610
Implantable Lead Location: 753860
Implantable Pulse Generator Implant Date: 20150130
Lead Channel Impedance Value: 318 Ohm
Lead Channel Pacing Threshold Amplitude: 0.7 V
Lead Channel Pacing Threshold Amplitude: 1.3 V
Lead Channel Pacing Threshold Pulse Width: 0.5 ms
Lead Channel Setting Pacing Amplitude: 2 V
MDC IDC LEAD IMPLANT DT: 20160713
MDC IDC LEAD LOCATION: 753859
MDC IDC LEAD SERIAL: 29813363
MDC IDC MSMT BATTERY REMAINING PERCENTAGE: 65 %
MDC IDC MSMT LEADCHNL RV IMPEDANCE VALUE: 626 Ohm
MDC IDC MSMT LEADCHNL RV PACING THRESHOLD PULSEWIDTH: 0.4 ms
MDC IDC SET LEADCHNL RV PACING AMPLITUDE: 1.2 V
MDC IDC SET LEADCHNL RV PACING PULSEWIDTH: 0.4 ms
MDC IDC SET LEADCHNL RV SENSING SENSITIVITY: 2 mV
MDC IDC STAT BRADY RA PERCENT PACED: 98 %
Pulse Gen Serial Number: 393477

## 2018-06-05 ENCOUNTER — Other Ambulatory Visit: Payer: Self-pay | Admitting: Family Medicine

## 2018-06-06 ENCOUNTER — Other Ambulatory Visit: Payer: Self-pay | Admitting: Family Medicine

## 2018-06-06 DIAGNOSIS — E119 Type 2 diabetes mellitus without complications: Secondary | ICD-10-CM

## 2018-07-20 ENCOUNTER — Other Ambulatory Visit: Payer: Self-pay | Admitting: Cardiology

## 2018-07-21 NOTE — Telephone Encounter (Signed)
Xarelto 15mg  refill request received; pt is 83 yrs old, wt-59.8kg, Crea-0.87 on 10/09/17, last seen by Dr. Tamala Julian on 04/22/18, [place].41ml/min; will send in refill to requested pharmacy.

## 2018-08-04 ENCOUNTER — Ambulatory Visit (INDEPENDENT_AMBULATORY_CARE_PROVIDER_SITE_OTHER): Payer: Medicare Other | Admitting: *Deleted

## 2018-08-04 DIAGNOSIS — I495 Sick sinus syndrome: Secondary | ICD-10-CM

## 2018-08-04 NOTE — Progress Notes (Signed)
Remote pacemaker transmission.   

## 2018-08-05 ENCOUNTER — Other Ambulatory Visit: Payer: Self-pay | Admitting: Family Medicine

## 2018-08-05 DIAGNOSIS — J42 Unspecified chronic bronchitis: Secondary | ICD-10-CM

## 2018-08-07 ENCOUNTER — Encounter: Payer: Self-pay | Admitting: Cardiology

## 2018-08-25 ENCOUNTER — Encounter: Payer: Self-pay | Admitting: *Deleted

## 2018-08-26 ENCOUNTER — Encounter

## 2018-08-26 ENCOUNTER — Ambulatory Visit (INDEPENDENT_AMBULATORY_CARE_PROVIDER_SITE_OTHER): Payer: Medicare Other | Admitting: Cardiology

## 2018-08-26 ENCOUNTER — Encounter: Payer: Self-pay | Admitting: Cardiology

## 2018-08-26 VITALS — BP 144/64 | HR 87 | Ht 61.5 in | Wt 127.0 lb

## 2018-08-26 DIAGNOSIS — I10 Essential (primary) hypertension: Secondary | ICD-10-CM

## 2018-08-26 DIAGNOSIS — I495 Sick sinus syndrome: Secondary | ICD-10-CM

## 2018-08-26 DIAGNOSIS — I2510 Atherosclerotic heart disease of native coronary artery without angina pectoris: Secondary | ICD-10-CM | POA: Diagnosis not present

## 2018-08-26 DIAGNOSIS — I480 Paroxysmal atrial fibrillation: Secondary | ICD-10-CM

## 2018-08-26 NOTE — Progress Notes (Signed)
Electrophysiology Office Note   Date:  08/26/2018   ID:  Verley Pariseau, DOB 1935/03/28, MRN [id number]  PCP:  [name], [name], MD  Cardiologist:  Tamala Julian Primary Electrophysiologist:  Constance Haw, MD    CC: Follow up for atrial fibrillation, tachybradycardia syndrome, and pacemaker.   History of Present Illness: [name] is a 83 y.o. female who presents today for electrophysiology follow up.   Hx CAD, previous coronary stenting including circumflex and right coronary arteries, hypertension, diabetes, COPD, permanent [employer] pacemaker for tachybradycardia syndrome, and chronic anticoagulation therapy. She reports that she has done well since her last visit. She denies any heart racing or palpitations. No Afib on device interrogation. She is tolerating Multaq once daily dose. She denies any bleeding issues with anticoagulation.  Today, denies symptoms of palpitations, chest pain, orthopnea, PND, lower extremity edema, claudication, dizziness, presyncope, syncope, bleeding, or neurologic sequela. The patient is tolerating medications without difficulties.    Past Medical History:  Diagnosis Date  . CAD (coronary artery disease), native coronary artery    PTCA of distal right coronary artery 1997 Cypher stents to circumflex 2005 Cardiac cath in 2011 with patent stent to circumflex and moderate disease elsewhere treated medically   . Cardiac pacemaker in situ 12/24/2015   Original implant reportedly in 1991 for tachybradycardia syndrome, generator change in 1999, 2004 and evidently again in 2016 in [place]   . COPD (chronic obstructive pulmonary disease) ([place])   . Diabetes mellitus without complication ([place])   . Hypertension   . Hypothyroidism 03/23/2010  . Pacemaker   . Paroxysmal atrial fibrillation ([place]) 03/23/2010   CHA2DS2VASC score 5    Past Surgical History:  Procedure Laterality Date  . CARDIAC CATHETERIZATION N/A 12/26/2015   Procedure: Left Heart Cath and  Coronary Angiography;  Surgeon: Peter M [name], MD;  Location: [facility];  Service: Cardiovascular;  Laterality: N/A;  . CARDIOVERSION  05/2015  . CHOLECYSTECTOMY  2012  . CORONARY ANGIOPLASTY WITH STENT PLACEMENT  1990  . HERNIA REPAIR    . PACEMAKER GENERATOR CHANGE  [id number], 2016  . PACEMAKER INSERTION  1991     Current Outpatient Medications  Medication Sig Dispense Refill  . atorvastatin (LIPITOR) 10 MG tablet TAKE 1 TABLET BY MOUTH DAILY 90 tablet 3  . carvedilol (COREG) 12.5 MG tablet TAKE 1 TABLET (12.5 MG TOTAL) BY MOUTH 2 (TWO) TIMES DAILY WITH A MEAL. 180 tablet 2  . dronedarone (MULTAQ) 400 MG tablet Take 400 mg by mouth daily.    Marland Kitchen esomeprazole (NEXIUM) 40 MG capsule Take 40 mg by mouth daily at 12 noon.    . fenofibrate (TRICOR) 48 MG tablet Take 48 mg by mouth daily.    Marland Kitchen glipiZIDE (GLUCOTROL) 5 MG tablet TAKE 1 TABLET (5 MG TOTAL) BY MOUTH DAILY BEFORE BREAKFAST. 20-30 MINUTES BEFORE MEAL. 90 tablet 3  . glucose blood test strip Use as instructed 100 each 12  . levothyroxine (SYNTHROID, LEVOTHROID) 112 MCG tablet Take 1 tablet (112 mcg total) by mouth daily before breakfast. 90 tablet 2  . linagliptin (TRADJENTA) 5 MG TABS tablet TAKE 1 TABLET (5 MG TOTAL) DAILY BY MOUTH. 90 tablet 2  . losartan (COZAAR) 100 MG tablet TAKE 1 TABLET BY MOUTH EVERY DAY 90 tablet 1  . metFORMIN (GLUCOPHAGE) 500 MG tablet TAKE 1 TABLET (500 MG TOTAL) BY MOUTH EVERY EVENING. 90 tablet 1  . nitroGLYCERIN (NITROSTAT) 0.4 MG SL tablet Place 0.4 mg under the tongue every 5 (five)  minutes x 3 doses as needed for chest pain (Max 3 doses within 15 min. Call 911).    Marland Kitchen PROAIR HFA 108 (90 Base) MCG/ACT inhaler TAKE 2 PUFFS BY MOUTH EVERY 6 HOURS AS NEEDED FOR WHEEZE OR SHORTNESS OF BREATH*NOT COVERED* 8.5 Inhaler 1  . XARELTO 15 MG TABS tablet TAKE 1 TABLET (15 MG TOTAL) BY MOUTH DAILY WITH SUPPER. 30 tablet 5   No current facility-administered medications for this visit.     Allergies:    Patient has no known allergies.   Social History:  The patient  reports that she has been smoking. She has been smoking about 0.50 packs per day. She has never used smokeless tobacco. She reports that she does not drink alcohol or use drugs.   Family History:  The patient's family history includes Cirrhosis in her brother; Heart attack in her mother; Heart disease in her father and mother.   ROS:  Please see the history of present illness.   Otherwise, review of systems is positive for SOB.   All other systems are reviewed and negative.   PHYSICAL EXAM: VS:  BP (!) 144/64   Pulse 87   Ht 5' 1.5" (1.562 m)   Wt 127 lb (57.6 kg)   LMP  (LMP Unknown)   SpO2 97%   BMI 23.61 kg/m  , BMI Body mass index is 23.61 kg/m. GEN: Well nourished, well developed, in no acute distress  HEENT: normal  Neck: no JVD, carotid bruits, or masses Cardiac: RRR; no murmurs, rubs, or gallops,no edema  Respiratory:  clear to auscultation bilaterally, decreased breath sounds. MS: no deformity or atrophy  Skin: warm and dry, device site well healed Neuro:  Strength and sensation are intact Psych: euthymic mood, full affect  EKG:  EKG is ordered today. Personal review of the ekg ordered shows atrail paced HR 70, PR 228  Personal review of the device interrogation today. Results in [facility]: 10/09/2017: BUN 21; Creatinine, Ser 0.87; Potassium 4.1; Sodium 141; TSH 0.42 02/28/2018: Hemoglobin 11.7; Platelets 190.0    Lipid Panel     Component Value Date/Time   CHOL 132 03/10/2017 0457   TRIG 61 03/10/2017 0457   HDL 56 03/10/2017 0457   CHOLHDL 2.4 03/10/2017 0457   VLDL 12 03/10/2017 0457   LDLCALC 64 03/10/2017 0457     Wt Readings from Last 3 Encounters:  08/26/18 127 lb (57.6 kg)  04/22/18 131 lb 12.8 oz (59.8 kg)  02/28/18 134 lb 2 oz (60.8 kg)      Other studies Reviewed: Additional studies/ records that were reviewed today include:  TTE 12/25/15 - Left ventricle: The cavity  size was normal. Systolic function was   normal. The estimated ejection fraction was in the range of 55%   to 60%. Wall motion was normal; there were no regional wall   motion abnormalities. The study is not technically sufficient to   allow evaluation of LV diastolic function. - Aortic valve: Transvalvular velocity was within the normal range.   There was no stenosis. There was mild to moderate regurgitation. - Mitral valve: There was no regurgitation. - Left atrium: The atrium was severely dilated. - Right ventricle: The cavity size was normal. Wall thickness was   normal. Systolic function was normal. - Right atrium: The atrium was severely dilated. - Tricuspid valve: There was mild regurgitation. - Pulmonary arteries: Systolic pressure was within the normal   range. PA peak pressure: 23 mm Hg (S).  Cardiac cath 12/26/15   Prox RCA to Dist RCA lesion, 30% stenosed.  Prox LAD lesion, 40% stenosed.  Mid LAD to Dist LAD lesion, 45% stenosed.  Ost Cx to Prox Cx lesion, 10% stenosed. The lesion was previously treated with a drug-eluting stent greater than two years ago. The left ventricular systolic function is normal.   ASSESSMENT AND PLAN:  1.  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  Device functioning appropriately.  No changes at this time.  2. Paroxysmal atrial fibrillation: Currently on Xarelto and Multaq.  She has been taking her Multaq daily and has no longer had issues with diarrhea.  She has had no palpitations. No changes today.  This patients CHA2DS2-VASc Score and unadjusted Ischemic Stroke Rate (% per year) is equal to 7.2 % stroke rate/year from a score of 5  Above score calculated as 1 point each if present [CHF, HTN, DM, Vascular=MI/PAD/Aortic Plaque, Age if 65-74, or Female] Above score calculated as 2 points each if present [Age > 75, or Stroke/TIA/TE]  3. Hypertension: Borderline on present therapy. Fredrick Geoghegan reassess at next  visit.  No changes today.  4. Coronary artery disease: Stable, no anginal symptoms.  Current medicines are reviewed at length with the patient today.   The patient does not have concerns regarding her medicines.  The following changes were made today: None  Labs/ tests ordered today include:  Orders Placed This Encounter  Procedures  . EKG 12-Lead     Disposition:   FU with Afib clinic in 6 months. Follow up with Dr. Curt Bears in 1 year.  Signed, Daman Steffenhagen Meredith Leeds, MD  08/26/2018 12:25 PM     [facility] [address] [place] [address] [phone] (office) [phone] (fax)  I have seen and examined this patient with Adline Peals.  Agree with above, note added to reflect my findings.  On exam, RRR, no murmurs, lungs clear.  Currently feeling well without major complaint.  She is able to do all of her daily activities.  She has noted no further episodes of atrial fibrillation.  We Chyan Carnero make no changes to her medications at this time.  Lyam Provencio M. Early Steel MD 08/26/2018 12:25 PM

## 2018-08-26 NOTE — Patient Instructions (Addendum)
Medication Instructions:  Your physician recommends that you continue on your current medications as directed. Please refer to the Current Medication list given to you today.  *If you need a refill on your cardiac medications before your next appointment, please call your pharmacy*  Labwork: None ordered  Testing/Procedures: None ordered  Follow-Up: Remote monitoring is used to monitor your Pacemaker or ICD from home. This monitoring reduces the number of office visits required to check your device to one time per year. It allows [place] to keep an eye on the functioning of your device to ensure it is working properly. You are scheduled for a device check from home on 11/03/2018. You may send your transmission at any time that day. If you have a wireless device, the transmission will be sent automatically. After your physician reviews your transmission, you will receive a postcard with your next transmission date.   Your physician wants you to follow-up in: 6 months with Clint Fenton Audry Pili), PA in the AFib clinic.  You will receive a reminder letter in the mail two months in advance. If you don't receive a letter, please call our office to schedule the follow-up appointment.  Thank you for choosing [facility]!!   Trinidad Curet, RN [phone]

## 2018-09-01 ENCOUNTER — Other Ambulatory Visit: Payer: Self-pay | Admitting: Family Medicine

## 2018-09-01 DIAGNOSIS — E890 Postprocedural hypothyroidism: Secondary | ICD-10-CM

## 2018-09-01 DIAGNOSIS — J310 Chronic rhinitis: Secondary | ICD-10-CM

## 2018-09-04 ENCOUNTER — Other Ambulatory Visit: Payer: Self-pay | Admitting: Family Medicine

## 2018-09-04 DIAGNOSIS — E119 Type 2 diabetes mellitus without complications: Secondary | ICD-10-CM

## 2018-09-04 MED ORDER — LINAGLIPTIN 5 MG PO TABS: ORAL_TABLET | ORAL | 0 refills | Status: DC

## 2018-09-04 NOTE — Telephone Encounter (Signed)
Courtesy refill  Pt has appt 09/05/18

## 2018-09-04 NOTE — Telephone Encounter (Signed)
Copied from [place] [id number]. Topic: Quick Communication - Rx Refill/Question >> Sep 04, 2018 12:10 PM Leward Quan A wrote: Medication: linagliptin (TRADJENTA) 5 MG TABS tablet [  Has the patient contacted their pharmacy? Yes.   (Agent: If no, request that the patient contact the pharmacy for the refill.) (Agent: If yes, when and what did the pharmacy advise?)  Preferred Pharmacy (with phone number or street name): [id number] [address], [place] [phone] (Phone) [phone] (Fax)     Agent: Please be advised that RX refills may take up to 3 business days. We ask that you follow-up with your pharmacy.

## 2018-09-05 ENCOUNTER — Ambulatory Visit: Payer: Medicare Other | Admitting: Family Medicine

## 2018-09-08 ENCOUNTER — Ambulatory Visit (INDEPENDENT_AMBULATORY_CARE_PROVIDER_SITE_OTHER): Payer: Medicare Other | Admitting: Family Medicine

## 2018-09-08 ENCOUNTER — Encounter: Payer: Self-pay | Admitting: Family Medicine

## 2018-09-08 VITALS — BP 112/58 | HR 70 | Temp 97.8°F | Wt 137.0 lb

## 2018-09-08 DIAGNOSIS — F17210 Nicotine dependence, cigarettes, uncomplicated: Secondary | ICD-10-CM

## 2018-09-08 DIAGNOSIS — J441 Chronic obstructive pulmonary disease with (acute) exacerbation: Secondary | ICD-10-CM | POA: Diagnosis not present

## 2018-09-08 MED ORDER — AMOXICILLIN-POT CLAVULANATE 875-125 MG PO TABS: 1.0000 | ORAL_TABLET | Freq: Two times a day (BID) | ORAL | 0 refills | Status: AC

## 2018-09-08 NOTE — Progress Notes (Signed)
Subjective:    Patient ID: [name], female    DOB: [birth year], 83 y.o.   MRN: [id number]  No chief complaint on file. Pt accompanied by her daughter.  HPI Patient was seen today for acute concern.  Pt endorses cough, increased sputum production, chest congestion, HAs, chills, and rhinorrhea since Thanksgiving.  Pt took some left over cough syrup for her symptoms and Albuterol inhaler.  Pt just started using the inhaler a few days ago.  Pt smoking 1 pk q 3 days. Has no desire to quit.   Past Medical History:  Diagnosis Date  . CAD (coronary artery disease), native coronary artery    PTCA of distal right coronary artery 1997 Cypher stents to circumflex 2005 Cardiac cath in 2011 with patent stent to circumflex and moderate disease elsewhere treated medically   . Cardiac pacemaker in situ 12/24/2015   Original implant reportedly in 1991 for tachybradycardia syndrome, generator change in 1999, 2004 and evidently again in 2016 in [place]   . COPD (chronic obstructive pulmonary disease) ([place])   . Diabetes mellitus without complication ([place])   . Hypertension   . Hypothyroidism 03/23/2010  . Pacemaker   . Paroxysmal atrial fibrillation (HCC) 03/23/2010   CHA2DS2VASC score 5     No Known Allergies  ROS General: Denies fever, chills, night sweats, changes in weight, changes in appetite  +chills HEENT: Denies ear pain, changes in vision, sore throat  +HAs, rhinorrhea CV: Denies CP, palpitations, orthopnea  +SOB Pulm: + SOB, cough, wheezing GI: Denies abdominal pain, nausea, vomiting, diarrhea, constipation GU: Denies dysuria, hematuria, frequency, vaginal discharge Msk: Denies muscle cramps, joint pains Neuro: Denies weakness, numbness, tingling Skin: Denies rashes, bruising Psych: Denies depression, anxiety, hallucinations    Objective:    Blood pressure (!) 112/58, pulse 70, temperature 97.8 F (36.6 C), temperature source Oral, weight 137 lb (62.1 kg), SpO2 96 %.   Gen.  Pleasant, well-nourished, in no distress, normal affect   HEENT: [facility]/AT, face symmetric, no scleral icterus, PERRLA, nares patent without drainage, pharynx without erythema or exudate.  TMs full b/l Lungs: productive cough, no accessory muscle use, CTAB, faint wheezes in bases Cardiovascular: pacer in place, RRR, no m/r/g, no peripheral edema Neuro:  A&Ox3, CN II-XII intact, normal gait  Wt Readings from Last 3 Encounters:  09/08/18 137 lb (62.1 kg)  08/26/18 127 lb (57.6 kg)  04/22/18 131 lb 12.8 oz (59.8 kg)    Lab Results  Component Value Date   WBC 6.5 02/28/2018   HGB 11.7 (L) 02/28/2018   HCT 35.2 (L) 02/28/2018   PLT 190.0 02/28/2018   GLUCOSE 96 10/09/2017   CHOL 132 03/10/2017   TRIG 61 03/10/2017   HDL 56 03/10/2017   LDLCALC 64 03/10/2017   ALT 14 12/24/2015   AST 15 12/24/2015   NA 141 10/09/2017   K 4.1 10/09/2017   CL 105 10/09/2017   CREATININE 0.87 10/09/2017   BUN 21 10/09/2017   CO2 29 10/09/2017   TSH 0.42 10/09/2017   INR 1.14 12/25/2015   HGBA1C 6.9 (H) 10/09/2017   MICROALBUR 1.3 10/09/2017    Assessment/Plan:  COPD exacerbation ([place]) -continue albuterol inhaler -offered prednisone, pt declined -advised to quit smoking  - Plan: amoxicillin-clavulanate (AUGMENTIN) 875-125 MG tablet  Cigarette nicotine dependence without complication  -Smoking cessation counseling greater than 3 minutes, less than 10 minutes -Patient advised to quit smoking however declines at this time -Smoking 1 pack every 3 days. -We will readdress at each visit  F/u prn  Grier Mitts, MD

## 2018-09-08 NOTE — Patient Instructions (Signed)
Chronic Obstructive Pulmonary Disease Exacerbation Chronic obstructive pulmonary disease (COPD) is a long-term (chronic) condition that affects the lungs. COPD is a general term that can be used to describe many different lung problems that cause lung swelling (inflammation) and limit airflow, including chronic bronchitis and emphysema. COPD exacerbations are episodes when breathing symptoms become much worse and require extra treatment. COPD exacerbations are usually caused by infections. Without treatment, COPD exacerbations can be severe and even life threatening. Frequent COPD exacerbations can cause further damage to the lungs. What are the causes? This condition may be caused by:  Respiratory infections, including viral and bacterial infections.  Exposure to smoke.  Exposure to air pollution, chemical fumes, or dust.  Things that give you an allergic reaction (allergens).  Not taking your usual COPD medicines as directed.  Underlying medical problems, such as congestive heart failure or infections not involving the lungs.  In many cases, the cause (trigger) of this condition is not known. What increases the risk? The following factors may make you more likely to develop this condition:  Smoking cigarettes.  Old age.  Frequent prior COPD exacerbations.  What are the signs or symptoms? Symptoms of this condition include:  Increased coughing.  Increased production of mucus from your lungs (sputum).  Increased wheezing.  Increased shortness of breath.  Rapid or labored breathing.  Chest tightness.  Less energy than usual.  Sleep disruption from symptoms.  Confusion or increased sleepiness.  Often these symptoms happen or get worse even with the use of medicines. How is this diagnosed? This condition is diagnosed based on:  Your medical history.  A physical exam.  You may also have tests, including:  A chest X-ray.  Blood tests.  Lung (pulmonary)  function tests.  How is this treated? Treatment for this condition depends on the severity and cause of the symptoms. You may need to be admitted to a hospital for treatment. Some of the treatments commonly used to treat COPD exacerbations are:  Antibiotic medicines. These may be used for severe exacerbations caused by a lung infection, such as pneumonia.  Bronchodilators. These are inhaled medicines that expand the air passages and allow increased airflow.  Steroid medicines. These act to reduce inflammation in the airways. They may be given with an inhaler, taken by mouth, or given through an IV tube inserted into one of your veins.  Supplemental oxygen therapy.  Airway clearing techniques, such as noninvasive ventilation (NIV) and positive expiratory pressure (PEP). These provide respiratory support through a mask or other noninvasive device. An example of this would be using a continuous positive airway pressure (CPAP) machine to improve delivery of oxygen into your lungs.  Follow these instructions at home: Medicines  Take over-the-counter and prescription medicines only as told by your health care provider. It is important to use correct technique with inhaled medicines.  If you were prescribed an antibiotic medicine or oral steroid, take it as told by your health care provider. Do not stop taking the medicine even if you start to feel better. Lifestyle  Eat a healthy diet.  Exercise regularly.  Get plenty of sleep.  Avoid exposure to all substances that irritate the airway, especially to tobacco smoke.  Wash your hands often with soap and water to reduce the risk of infection. If soap and water are not available, use hand sanitizer.  During flu season, avoid enclosed spaces that are crowded with people. General instructions  Drink enough fluid to keep your urine clear or pale yellow (  unless you have a medical condition that requires fluid restriction).  Use a cool mist  vaporizer. This humidifies the air and makes it easier for you to clear your chest when you cough.  If you have a home nebulizer and oxygen, continue to use them as told by your health care provider.  Keep all follow-up visits as told by your health care provider. This is important. How is this prevented?  Stay up-to-date on pneumococcal and influenza (flu) vaccines. A flu shot is recommended every year to help prevent exacerbations.  Do not use any products that contain nicotine or tobacco, such as cigarettes and e-cigarettes. Quitting smoking is very important in preventing COPD from getting worse and in preventing exacerbations from happening as often. If you need help quitting, ask your health care provider.  Follow all instructions for pulmonary rehabilitation after a recent exacerbation. This can help prevent future exacerbations.  Work with your health care provider to develop and follow an action plan. This tells you what steps to take when you experience certain symptoms. Contact a health care provider if:  You have a worsening of your regular COPD symptoms. Get help right away if:  You have worsening shortness of breath, even when resting.  You have trouble talking.  You have severe chest pain.  You cough up blood.  You have a fever.  You have weakness, vomit repeatedly, or faint.  You feel confused.  You are not able to sleep because of your symptoms.  You have trouble doing daily activities. Summary  COPD exacerbations are episodes when breathing symptoms become much worse and require extra treatment above your normal treatment.  Exacerbations can be severe and even life threatening. Frequent COPD exacerbations can cause further damage to your lungs.  COPD exacerbations are usually triggered by infections such as the flu, colds, and even pneumonia.  Treatment for this condition depends on the severity and cause of the symptoms. You may need to be admitted to a  hospital for treatment.  Quitting smoking is very important to prevent COPD from getting worse and to prevent exacerbations from happening as often. This information is not intended to replace advice given to you by your health care provider. Make sure you discuss any questions you have with your health care provider. Document Released: 07/15/2007 Document Revised: 10/22/2016 Document Reviewed: 10/22/2016 Elsevier Interactive Patient Education  [employer].

## 2018-09-20 LAB — CUP PACEART INCLINIC DEVICE CHECK
Date Time Interrogation Session: 20191221190705
Implantable Lead Implant Date: 19990610
Implantable Lead Implant Date: 20160713
Implantable Lead Location: 753859
Implantable Lead Location: 753860
Implantable Lead Model: 4136
Implantable Lead Serial Number: 29813363
Implantable Pulse Generator Implant Date: 20150130
Lead Channel Setting Pacing Amplitude: 1.2 V
Lead Channel Setting Pacing Amplitude: 2 V
Lead Channel Setting Pacing Pulse Width: 0.4 ms
Lead Channel Setting Sensing Sensitivity: 2 mV
Pulse Gen Serial Number: 393477

## 2018-09-21 ENCOUNTER — Other Ambulatory Visit: Payer: Self-pay | Admitting: Family Medicine

## 2018-09-21 DIAGNOSIS — J42 Unspecified chronic bronchitis: Secondary | ICD-10-CM

## 2018-10-03 LAB — CUP PACEART REMOTE DEVICE CHECK
Date Time Interrogation Session: 20200103185405
Implantable Lead Implant Date: 19990610
Implantable Lead Implant Date: 20160713
Implantable Lead Location: 753859
Implantable Lead Location: 753860
Implantable Lead Model: 4136
Implantable Lead Serial Number: 29813363
Implantable Pulse Generator Implant Date: 20150130
Pulse Gen Serial Number: 393477

## 2018-10-31 ENCOUNTER — Other Ambulatory Visit: Payer: Self-pay | Admitting: Family Medicine

## 2018-11-03 ENCOUNTER — Ambulatory Visit (INDEPENDENT_AMBULATORY_CARE_PROVIDER_SITE_OTHER): Payer: Medicare Other

## 2018-11-03 DIAGNOSIS — I495 Sick sinus syndrome: Secondary | ICD-10-CM | POA: Diagnosis not present

## 2018-11-05 LAB — CUP PACEART REMOTE DEVICE CHECK
Battery Remaining Longevity: 36 mo
Battery Remaining Percentage: 57 %
Brady Statistic RA Percent Paced: 99 %
Brady Statistic RV Percent Paced: 8 %
Date Time Interrogation Session: 20200203071100
Implantable Lead Implant Date: 19990610
Implantable Lead Location: 753859
Implantable Lead Location: 753860
Implantable Lead Model: 4136
Implantable Lead Serial Number: 29813363
Implantable Pulse Generator Implant Date: 20150130
Lead Channel Impedance Value: 309 Ohm
Lead Channel Impedance Value: 593 Ohm
Lead Channel Pacing Threshold Amplitude: 0.8 V
Lead Channel Pacing Threshold Pulse Width: 0.4 ms
Lead Channel Pacing Threshold Pulse Width: 0.5 ms
Lead Channel Setting Pacing Amplitude: 2 V
Lead Channel Setting Pacing Amplitude: 3.5 V
Lead Channel Setting Pacing Pulse Width: 0.4 ms
Lead Channel Setting Sensing Sensitivity: 2 mV
MDC IDC LEAD IMPLANT DT: 20160713
MDC IDC MSMT LEADCHNL RA PACING THRESHOLD AMPLITUDE: 1.1 V
MDC IDC PG SERIAL: 393477

## 2018-11-07 ENCOUNTER — Other Ambulatory Visit: Payer: Self-pay

## 2018-11-07 ENCOUNTER — Emergency Department (HOSPITAL_COMMUNITY)
Admission: EM | Admit: 2018-11-07 | Discharge: 2018-11-07 | Disposition: A | Discharge status: Home / Self Care | Payer: Medicare Other | Attending: Emergency Medicine | Admitting: Emergency Medicine

## 2018-11-07 ENCOUNTER — Encounter (HOSPITAL_COMMUNITY): Payer: Self-pay

## 2018-11-07 ENCOUNTER — Emergency Department (HOSPITAL_COMMUNITY): Payer: Medicare Other

## 2018-11-07 DIAGNOSIS — M25512 Pain in left shoulder: Secondary | ICD-10-CM | POA: Diagnosis not present

## 2018-11-07 DIAGNOSIS — F17210 Nicotine dependence, cigarettes, uncomplicated: Secondary | ICD-10-CM | POA: Diagnosis not present

## 2018-11-07 DIAGNOSIS — I10 Essential (primary) hypertension: Secondary | ICD-10-CM | POA: Diagnosis not present

## 2018-11-07 DIAGNOSIS — Z955 Presence of coronary angioplasty implant and graft: Secondary | ICD-10-CM | POA: Diagnosis not present

## 2018-11-07 DIAGNOSIS — M25511 Pain in right shoulder: Secondary | ICD-10-CM | POA: Diagnosis not present

## 2018-11-07 DIAGNOSIS — E119 Type 2 diabetes mellitus without complications: Secondary | ICD-10-CM | POA: Diagnosis not present

## 2018-11-07 DIAGNOSIS — Z79899 Other long term (current) drug therapy: Secondary | ICD-10-CM | POA: Insufficient documentation

## 2018-11-07 DIAGNOSIS — J449 Chronic obstructive pulmonary disease, unspecified: Secondary | ICD-10-CM | POA: Insufficient documentation

## 2018-11-07 DIAGNOSIS — E039 Hypothyroidism, unspecified: Secondary | ICD-10-CM | POA: Insufficient documentation

## 2018-11-07 DIAGNOSIS — M542 Cervicalgia: Secondary | ICD-10-CM | POA: Diagnosis not present

## 2018-11-07 DIAGNOSIS — R0689 Other abnormalities of breathing: Secondary | ICD-10-CM | POA: Diagnosis not present

## 2018-11-07 DIAGNOSIS — R079 Chest pain, unspecified: Secondary | ICD-10-CM | POA: Diagnosis not present

## 2018-11-07 DIAGNOSIS — Z950 Presence of cardiac pacemaker: Secondary | ICD-10-CM | POA: Diagnosis not present

## 2018-11-07 DIAGNOSIS — Z7984 Long term (current) use of oral hypoglycemic drugs: Secondary | ICD-10-CM | POA: Insufficient documentation

## 2018-11-07 LAB — BASIC METABOLIC PANEL
ANION GAP: 9 (ref 5–15)
BUN: 15 mg/dL (ref 8–23)
CO2: 25 mmol/L (ref 22–32)
Calcium: 9.3 mg/dL (ref 8.9–10.3)
Chloride: 107 mmol/L (ref 98–111)
Creatinine, Ser: 0.87 mg/dL (ref 0.44–1.00)
GFR calc Af Amer: >60 mL/min (ref >60)
GFR calc non Af Amer: >60 mL/min (ref >60)
Glucose, Bld: 106 mg/dL — ABNORMAL HIGH (ref 70–99)
POTASSIUM: 3.8 mmol/L (ref 3.5–5.1)
Sodium: 141 mmol/L (ref 135–145)

## 2018-11-07 LAB — CBC WITH DIFFERENTIAL/PLATELET
Abs Immature Granulocytes: 0.02 10*3/uL (ref 0.00–0.07)
BASOS PCT: 1 %
Basophils Absolute: 0 10*3/uL (ref 0.0–0.1)
Eosinophils Absolute: 0.1 10*3/uL (ref 0.0–0.5)
Eosinophils Relative: 2 %
HCT: 35.5 % — ABNORMAL LOW (ref 36.0–46.0)
Hemoglobin: 10.7 g/dL — ABNORMAL LOW (ref 12.0–15.0)
Immature Granulocytes: 0 %
Lymphocytes Relative: 47 %
Lymphs Abs: 2.6 10*3/uL (ref 0.7–4.0)
MCH: 27.4 pg (ref 26.0–34.0)
MCHC: 30.1 g/dL (ref 30.0–36.0)
MCV: 91 fL (ref 80.0–100.0)
Monocytes Absolute: 0.6 10*3/uL (ref 0.1–1.0)
Monocytes Relative: 10 %
NEUTROS ABS: 2.2 10*3/uL (ref 1.7–7.7)
Neutrophils Relative %: 40 %
Platelets: 181 10*3/uL (ref 150–400)
RBC: 3.9 MIL/uL (ref 3.87–5.11)
RDW: 14.4 % (ref 11.5–15.5)
WBC: 5.4 10*3/uL (ref 4.0–10.5)
nRBC: 0 % (ref 0.0–0.2)

## 2018-11-07 LAB — TROPONIN I: Troponin I: <0.03 ng/mL (ref <0.03)

## 2018-11-07 MED ORDER — DICLOFENAC EPOLAMINE 1.3 % TD PTCH: 1.0000 | MEDICATED_PATCH | Freq: Two times a day (BID) | TRANSDERMAL | 1 refills | Status: DC

## 2018-11-07 MED ORDER — ALBUTEROL SULFATE (2.5 MG/3ML) 0.083% IN NEBU
5.0000 mg | INHALATION_SOLUTION | Freq: Once | RESPIRATORY_TRACT | Status: AC
  Administered 2018-11-07: 5 mg via RESPIRATORY_TRACT
  Filled 2018-11-07: qty 6

## 2018-11-07 MED ORDER — DICLOFENAC EPOLAMINE 1.3 % TD PTCH
1.0000 | MEDICATED_PATCH | Freq: Two times a day (BID) | TRANSDERMAL | Status: DC
  Administered 2018-11-07: 1 via TRANSDERMAL
  Filled 2018-11-07: qty 1

## 2018-11-07 NOTE — ED Triage Notes (Signed)
Pt from home; c/o R sided neck pain that began this am, took tylenol w/ no relief, radiating to L shoulder; denies cp, sob, dizziness; denies injury

## 2018-11-07 NOTE — ED Provider Notes (Signed)
[facility]PART[facility] Provider Note   CSN: [id number] Arrival date & time: 11/07/18  1703     History   Chief Complaint No chief complaint on file.   HPI [name] is a 84 y.o. female.  HPI Patient presents with her daughter who assists with the HPI. Patient presents due to neck pain. Pain is focally in the right lateral posterior neck, began earlier today without clear precipitant. Over the course the day she has had pain radiating from this area throughout the neck, and into both posterior shoulders. Pain is sore, not improved with Tylenol. Patient has some generalized discomfort, and upper thoracic discomfort, but no other sustained pain, has baseline dyspnea, has mild cough, is a smoker, has COPD Patient also has a history of CAD, with pacemaker placement, initially performed decades ago, after an episode of pain similar to the one that brings her here today. She notes that since that time she has had multiple revisions of the pacemaker, including a few years ago. She denies any current lightheadedness, any syncope, any nausea, fever.  Past Medical History:  Diagnosis Date  . CAD (coronary artery disease), native coronary artery    PTCA of distal right coronary artery 1997 Cypher stents to circumflex 2005 Cardiac cath in 2011 with patent stent to circumflex and moderate disease elsewhere treated medically   . Cardiac pacemaker in situ 12/24/2015   Original implant reportedly in 1991 for tachybradycardia syndrome, generator change in 1999, 2004 and evidently again in 2016 in [place]   . COPD (chronic obstructive pulmonary disease) ([place])   . Diabetes mellitus without complication ([place])   . Hypertension   . Hypothyroidism 03/23/2010  . Pacemaker   . Paroxysmal atrial fibrillation ([place]) 03/23/2010   CHA2DS2VASC score 5     Patient Active Problem List   Diagnosis Date Noted  . Chronic rhinitis 02/28/2018  . Tobacco use disorder 05/10/2017  .  Hypertension with heart disease 05/10/2017  . Chest pain 03/10/2017  . Acute thoracic back pain   . Cardiac pacemaker in situ 12/24/2015  . History of Graves' disease 12/24/2015  . Current use of long term anticoagulation 12/24/2015  . Diabetes mellitus without complication ([place])   . Hypothyroidism 03/23/2010  . Paroxysmal atrial fibrillation ([place]) 03/23/2010  . COPD (chronic obstructive pulmonary disease) ([place]) 03/23/2010  . CAD (coronary artery disease), native coronary artery   . GERD     Past Surgical History:  Procedure Laterality Date  . CARDIAC CATHETERIZATION N/A 12/26/2015   Procedure: Left Heart Cath and Coronary Angiography;  Surgeon: Peter M [name], MD;  Location: [facility];  Service: Cardiovascular;  Laterality: N/A;  . CARDIOVERSION  05/2015  . CHOLECYSTECTOMY  2012  . CORONARY ANGIOPLASTY WITH STENT PLACEMENT  1990  . HERNIA REPAIR    . PACEMAKER GENERATOR CHANGE  [id number], 2016  . PACEMAKER INSERTION  1991     OB History   No obstetric history on file.      Home Medications    Prior to Admission medications   Medication Sig Start Date End Date Taking? Authorizing Provider  atorvastatin (LIPITOR) 10 MG tablet TAKE 1 TABLET BY MOUTH DAILY Patient taking differently: Take 10 mg by mouth daily.  02/03/18  Yes Camnitz, Will Hassell Done, MD  carvedilol (COREG) 12.5 MG tablet TAKE 1 TABLET (12.5 MG TOTAL) BY MOUTH 2 (TWO) TIMES DAILY WITH A MEAL. 01/22/18  Yes [name], [name], MD  dronedarone (MULTAQ) 400 MG tablet Take 400 mg  by mouth daily.   Yes [provider]  esomeprazole (NEXIUM) 40 MG capsule TAKE ONE CAPSULE EVERY DAY Patient taking differently: Take 40 mg by mouth daily.  11/04/18  Yes [name], [name], MD  fenofibrate (TRICOR) 48 MG tablet TAKE 1 TABLET EVERY DAY Patient taking differently: Take 48 mg by mouth daily.  11/04/18  Yes [name], [name], MD  glipiZIDE (GLUCOTROL) 5 MG tablet TAKE 1 TABLET (5 MG TOTAL) BY MOUTH DAILY BEFORE BREAKFAST.  20-30 [place]. 04/01/18  Yes [name], [name], MD  levothyroxine (SYNTHROID, LEVOTHROID) 112 MCG tablet TAKE 1 TABLET (112 MCG TOTAL) BY MOUTH DAILY BEFORE BREAKFAST. 09/01/18  Yes [name], [name], MD  losartan (COZAAR) 100 MG tablet TAKE 1 TABLET BY MOUTH EVERY DAY Patient taking differently: Take 100 mg by mouth daily.  06/05/18  Yes [name], [name], MD  metFORMIN (GLUCOPHAGE) 500 MG tablet TAKE 1 TABLET (500 MG TOTAL) BY MOUTH EVERY EVENING. 06/06/18  Yes [name], [name], MD  nitroGLYCERIN (NITROSTAT) 0.4 MG SL tablet Place 0.4 mg under the tongue every 5 (five) minutes x 3 doses as needed for chest pain (Max 3 doses within 15 min. Call 911).   Yes [provider]  PROAIR HFA 108 (90 Base) MCG/ACT inhaler TAKE 2 PUFFS BY MOUTH EVERY 6 HOURS AS NEEDED FOR WHEEZE OR SHORTNESS OF BREATH*NOT COVERED* Patient taking differently: Inhale 2 puffs into the lungs every 6 (six) hours as needed for shortness of breath.  08/05/18  Yes [name], [name], MD  TRADJENTA 5 MG TABS tablet TAKE 1 TABLET (5 MG TOTAL) DAILY BY MOUTH. Patient taking differently: Take 5 mg by mouth daily.  09/04/18  Yes [name], [name], MD  XARELTO 15 MG TABS tablet TAKE 1 TABLET (15 MG TOTAL) BY MOUTH DAILY WITH SUPPER. Patient taking differently: Take 15 mg by mouth daily with supper.  07/21/18  Yes Camnitz, Will Hassell Done, MD  glucose blood test strip Use as instructed 10/11/17   [name], [name], MD  ipratropium (ATROVENT) 0.03 % nasal spray PLACE 2 SPRAYS INTO BOTH NOSTRILS EVERY 12 (TWELVE) HOURS. Patient not taking: Reported on 09/08/2018 09/01/18   [name], [name], MD  linagliptin (TRADJENTA) 5 MG TABS tablet TAKE 1 TABLET (5 MG TOTAL) DAILY BY MOUTH. Patient not taking: Reported on 11/07/2018 09/04/18   [name], [name], MD    Family History Family History  Problem Relation Age of Onset  . Heart disease Father   . Heart disease Mother   . Heart attack Mother   . Cirrhosis Brother   . Colon cancer Neg Hx   . Stomach  cancer Neg Hx     Social History Social History   Tobacco Use  . Smoking status: Current Every Day Smoker    Packs/day: 0.30  . Smokeless tobacco: Never Used  Substance Use Topics  . Alcohol use: No    Alcohol/week: 0.0 standard drinks  . Drug use: No     Allergies   Patient has no known allergies.   Review of Systems Review of Systems  Constitutional:       Per HPI, otherwise negative  HENT:       Per HPI, otherwise negative  Respiratory:       Per HPI, otherwise negative  Cardiovascular:       Per HPI, otherwise negative  Gastrointestinal: Negative for vomiting.  Endocrine:       Negative aside from HPI  Genitourinary:  Neg aside from HPI   Musculoskeletal:       Per HPI, otherwise negative  Skin: Negative.   Neurological: Negative for syncope.     Physical Exam Updated Vital Signs BP (!) 165/87   Pulse 70   Resp 15   Ht 5\' 2"  (1.575 m)   Wt 62.6 kg   LMP  (LMP Unknown)   SpO2 97%   BMI 25.24 kg/m   Physical Exam Vitals signs and nursing note reviewed.  Constitutional:      General: She is not in acute distress.    Appearance: She is well-developed.  HENT:     Head: Normocephalic and atraumatic.  Eyes:     Conjunctiva/sclera: Conjunctivae normal.  Cardiovascular:     Rate and Rhythm: Normal rate and regular rhythm.  Pulmonary:     Effort: Pulmonary effort is normal.     Breath sounds: Decreased air movement present. No stridor.  Abdominal:     General: There is no distension.  Skin:    General: Skin is warm and dry.  Neurological:     Mental Status: She is alert and oriented to person, place, and time.     Cranial Nerves: No cranial nerve deficit.      ED Treatments / Results  Labs (all labs ordered are listed, but only abnormal results are displayed) Labs Reviewed  BASIC METABOLIC PANEL - Abnormal; Notable for the following components:      Result Value   Glucose, Bld 106 (*)    All other components within normal limits    CBC WITH DIFFERENTIAL/PLATELET - Abnormal; Notable for the following components:   Hemoglobin 10.7 (*)    HCT 35.5 (*)    All other components within normal limits  TROPONIN I    EKG EKG Interpretation  Date/Time:  Friday November 07 2018 17:17:55 EST Ventricular Rate:  70 PR Interval:    QRS Duration: 97 QT Interval:  418 QTC Calculation: 451 R Axis:   78 Text Interpretation:  Sinus rhythm Prolonged PR interval Borderline T abnormalities, anterior leads Abnormal ekg Confirmed by Carmin Muskrat [phone]) on 11/07/2018 5:24:15 PM   Radiology Dg Chest 2 View  Result Date: 11/07/2018 CLINICAL DATA:  Upper chest pain since this morning. EXAM: CHEST - 2 VIEW COMPARISON:  Chest radiograph March 09, 2018 FINDINGS: Cardiac silhouette is mildly enlarged and unchanged. Mildly calcified aortic arch. Mild chronic interstitial changes and increased lung volumes with LEFT lung base scarring. No pleural effusion or focal consolidation. No pneumothorax. Three lead RIGHT cardiac pacemaker in situ. Surgical clips in the included right abdomen compatible with cholecystectomy. Mild chronic midthoracic compression fracture. Osteopenia. IMPRESSION: 1. Stable cardiomegaly.  No acute pulmonary process. 2. Chronic interstitial changes/COPD. 3.  Aortic Atherosclerosis (ICD10-I70.0). Electronically Signed   By: Elon Alas M.D.   On: 11/07/2018 19:35   Ct Cervical Spine Wo Contrast  Result Date: 11/07/2018 CLINICAL DATA:  84 y/o  F; midline neck pain, initial exam. EXAM: CT CERVICAL SPINE WITHOUT CONTRAST TECHNIQUE: Multidetector CT imaging of the cervical spine was performed without intravenous contrast. Multiplanar CT image reconstructions were also generated. COMPARISON:  03/11/2010 CT chest FINDINGS: Alignment: Normal. Skull base and vertebrae: No acute fracture. No primary bone lesion or focal pathologic process. Soft tissues and spinal canal: No prevertebral fluid or swelling. No visible canal hematoma. Disc  levels: Mild discogenic degenerative changes with loss of intervertebral disc space height at the C6-7 level and multilevel small endplate marginal osteophytes. No significant bony foraminal  or spinal canal stenosis. Upper chest: Extensive emphysema within the lung apices. Stable 3 mm nodule in right lung apex (5 image 83). Other: None. IMPRESSION: 1. No acute fracture or static subluxation of the cervical spine. 2. Mild discogenic degenerative changes at C6-7 level. 3. Extensive emphysema within the lung apices. Electronically Signed   By: Kristine Garbe M.D.   On: 11/07/2018 19:50    Procedures Procedures (including critical care time)  Medications Ordered in ED Medications  diclofenac (FLECTOR) 1.3 % 1 patch (has no administration in time range)  albuterol (PROVENTIL) (2.5 MG/3ML) 0.083% nebulizer solution 5 mg (5 mg Nebulization Given 11/07/18 2007)     Initial Impression / Assessment and Plan / ED Course  I have reviewed the triage vital signs and the nursing notes.  Pertinent labs & imaging results that were available during my care of the patient were reviewed by me and considered in my medical decision making (see chart for details).     9:13 PM Patient sleeping, awakens easily. She has complaints of similar discomfort across her neck, upper shoulders. Patient's evaluation here reassuring, no evidence for ACS, notes for pneumonia. Some suspicion for musculoskeletal etiology given description of pain across the neck, upper shoulders, absence of other alarming findings, absence of distress. Patient will receive topical anti-inflammatory regimen here, will be discharged with similar, to follow-up with primary care.  Final Clinical Impressions(s) / ED Diagnoses  Neck pain   Carmin Muskrat, MD 11/07/18 2113

## 2018-11-07 NOTE — Discharge Instructions (Signed)
As discussed, your evaluation today has been largely reassuring.  But, it is important that you monitor your condition carefully, and do not hesitate to return to the ED if you develop new, or concerning changes in your condition.  Please use the remainder of the provided pain medication patches every 12 hours for pain relief. If you are unable to obtain the new prescription, please consider using over-the-counter patches including the ingredients methyl salicylate and lidocaine as an alternative.  Otherwise, please follow-up with your physician for appropriate ongoing care.

## 2018-11-11 NOTE — Progress Notes (Signed)
Remote pacemaker transmission.   

## 2018-11-12 ENCOUNTER — Telehealth: Payer: Self-pay | Admitting: Family Medicine

## 2018-11-12 ENCOUNTER — Encounter: Payer: Self-pay | Admitting: Family Medicine

## 2018-11-12 ENCOUNTER — Ambulatory Visit (INDEPENDENT_AMBULATORY_CARE_PROVIDER_SITE_OTHER): Payer: Medicare Other | Admitting: Family Medicine

## 2018-11-12 VITALS — BP 120/68 | HR 74 | Temp 98.4°F | Resp 16 | Ht 62.0 in | Wt 134.5 lb

## 2018-11-12 DIAGNOSIS — J42 Unspecified chronic bronchitis: Secondary | ICD-10-CM | POA: Diagnosis not present

## 2018-11-12 DIAGNOSIS — E1169 Type 2 diabetes mellitus with other specified complication: Secondary | ICD-10-CM

## 2018-11-12 DIAGNOSIS — F17200 Nicotine dependence, unspecified, uncomplicated: Secondary | ICD-10-CM

## 2018-11-12 DIAGNOSIS — M542 Cervicalgia: Secondary | ICD-10-CM

## 2018-11-12 LAB — MICROALBUMIN / CREATININE URINE RATIO
Creatinine,U: 113.6 mg/dL
MICROALB/CREAT RATIO: 3.7 mg/g (ref 0.0–30.0)
Microalb, Ur: 4.2 mg/dL — ABNORMAL HIGH (ref 0.0–1.9)

## 2018-11-12 LAB — POCT GLYCOSYLATED HEMOGLOBIN (HGB A1C): Hemoglobin A1C: 5.3 % (ref 4.0–5.6)

## 2018-11-12 MED ORDER — BUDESONIDE-FORMOTEROL FUMARATE 80-4.5 MCG/ACT IN AERO: 2.0000 | INHALATION_SPRAY | Freq: Two times a day (BID) | RESPIRATORY_TRACT | 3 refills | Status: DC

## 2018-11-12 MED ORDER — TIZANIDINE HCL 2 MG PO CAPS: 2.0000 mg | ORAL_CAPSULE | Freq: Every day | ORAL | 0 refills | Status: AC

## 2018-11-12 MED ORDER — DICLOFENAC SODIUM 1 % TD GEL: 4.0000 g | Freq: Four times a day (QID) | TRANSDERMAL | 3 refills | Status: DC

## 2018-11-12 MED ORDER — NICOTINE 14 MG/24HR TD PT24: 14.0000 mg | MEDICATED_PATCH | Freq: Every day | TRANSDERMAL | 0 refills | Status: DC

## 2018-11-12 NOTE — Telephone Encounter (Signed)
Copied from [place] [id number]. Topic: Quick Communication - Rx Refill/Question >> Nov 12, 2018 10:30 AM Windy Kalata wrote: Medication: tizanidine (ZANAFLEX) 2 MG capsule   Has the patient contacted their pharmacy? Yes.   (Agent: If no, request that the patient contact the pharmacy for the refill.) (Agent: If yes, when and what did the pharmacy advise?) Pharmacy calling stating that there is a drug interaction she is on multaq 400mg , please advise  Preferred Pharmacy (with phone number or street name): [id number] [address], [place] [phone] (Phone) [phone] (Fax)    Agent: Please be advised that RX refills may take up to 3 business days. We ask that you follow-up with your pharmacy.

## 2018-11-12 NOTE — Progress Notes (Signed)
HPI:  Chief Complaint  Patient presents with  . Right shoulder pain    radiates to the neck, went to ER on 11/07/2018    RobinRobin [name] is a 84 y.o. female, who is here today with her daughter to follow on recent ER visit.  She presented to the ER on 11/07/2018 complaining of neck pain radiated to left shoulder and proximal aspect of left arm. She denies associated dyspnea, palpitations, or diaphoresis.  No history of trauma. She woke up with right-sided neck pain, thought she slept "wrong."  Pain got worse and moved to the left side of neck and left shoulder, 8/10. Pain is exacerbated by neck movement and alleviated by rest and local heat. + Limitation of neck ROM.  Pain has improved but has not resolved. It is intermittent but has pain "most of the time." Denies tingling,numbness,or burning sensation.  She took Tylenol,whihc helps some.  Cervical CT on 11/07/18: 1. No acute fracture or static subluxation of the cervical spine. 2. Mild discogenic degenerative changes at C6-7 level. 3. Extensive emphysema within the lung apices.  COPD:  Her daughter is reporting daily wheezing, apparently [name] does not use albuterol inhaler every time she wheezes. She received a breathing treatment during her recent ER visit. She states that albuterol inhaler causes mild headache. Still smoking, a pack of cigarettes last 3 days.  She denies worsening cough or exertional dyspnea but her daughter has noted some difficulty breathing with exertion.  Diabetes Mellitus II:  Last follow-up appointment in 10/2017. Currently on glipizide 5 mg and Tradjenta 5 mg.  Checking BS's : 80's most of the time. Hypoglycemia:Denies  She is tolerating medications well. She denies abdominal pain, nausea, vomiting, polydipsia, polyuria, or polyphagia. No numbness, tingling, or burning.   Lab Results  Component Value Date   CREATININE 0.87 11/07/2018   BUN 15 11/07/2018   NA 141 11/07/2018   K  3.8 11/07/2018   CL 107 11/07/2018   CO2 25 11/07/2018    Lab Results  Component Value Date   HGBA1C 6.9 (H) 10/09/2017   Lab Results  Component Value Date   MICROALBUR 1.3 10/09/2017     Review of Systems  Constitutional: Negative for activity change, appetite change, fatigue and fever.  HENT: Negative for mouth sores, nosebleeds and trouble swallowing.   Eyes: Negative for redness and visual disturbance.  Respiratory: Positive for cough, shortness of breath and wheezing.   Cardiovascular: Negative for chest pain, palpitations and leg swelling.  Gastrointestinal: Negative for abdominal pain, nausea and vomiting.       Negative for changes in bowel habits.  Genitourinary: Negative for decreased urine volume and hematuria.  Musculoskeletal: Positive for neck pain.  Skin: Negative for rash and wound.  Neurological: Negative for weakness, numbness and headaches.    Current Outpatient Medications on File Prior to Visit  Medication Sig Dispense Refill  . atorvastatin (LIPITOR) 10 MG tablet TAKE 1 TABLET BY MOUTH DAILY (Patient taking differently: Take 10 mg by mouth daily. ) 90 tablet 3  . carvedilol (COREG) 12.5 MG tablet TAKE 1 TABLET (12.5 MG TOTAL) BY MOUTH 2 (TWO) TIMES DAILY WITH A MEAL. 180 tablet 2  . diclofenac (FLECTOR) 1.3 % PTCH Place 1 patch onto the skin 2 (two) times daily. 5 patch 1  . dronedarone (MULTAQ) 400 MG tablet Take 400 mg by mouth daily.    Marland Kitchen esomeprazole (NEXIUM) 40 MG capsule TAKE ONE CAPSULE EVERY DAY (Patient taking differently: Take  40 mg by mouth daily. ) 90 capsule 1  . fenofibrate (TRICOR) 48 MG tablet TAKE 1 TABLET EVERY DAY (Patient taking differently: Take 48 mg by mouth daily. ) 90 tablet 3  . glucose blood test strip Use as instructed 100 each 12  . ipratropium (ATROVENT) 0.03 % nasal spray PLACE 2 SPRAYS INTO BOTH NOSTRILS EVERY 12 (TWELVE) HOURS. 30 mL 2  . levothyroxine (SYNTHROID, LEVOTHROID) 112 MCG tablet TAKE 1 TABLET (112 MCG TOTAL) BY  MOUTH DAILY BEFORE BREAKFAST. 90 tablet 2  . linagliptin (TRADJENTA) 5 MG TABS tablet TAKE 1 TABLET (5 MG TOTAL) DAILY BY MOUTH. 90 tablet 0  . losartan (COZAAR) 100 MG tablet TAKE 1 TABLET BY MOUTH EVERY DAY (Patient taking differently: Take 100 mg by mouth daily. ) 90 tablet 1  . metFORMIN (GLUCOPHAGE) 500 MG tablet TAKE 1 TABLET (500 MG TOTAL) BY MOUTH EVERY EVENING. 90 tablet 1  . nitroGLYCERIN (NITROSTAT) 0.4 MG SL tablet Place 0.4 mg under the tongue every 5 (five) minutes x 3 doses as needed for chest pain (Max 3 doses within 15 min. Call 911).    Marland Kitchen PROAIR HFA 108 (90 Base) MCG/ACT inhaler TAKE 2 PUFFS BY MOUTH EVERY 6 HOURS AS NEEDED FOR WHEEZE OR SHORTNESS OF BREATH*NOT COVERED* (Patient taking differently: Inhale 2 puffs into the lungs every 6 (six) hours as needed for shortness of breath. ) 8.5 Inhaler 1  . TRADJENTA 5 MG TABS tablet TAKE 1 TABLET (5 MG TOTAL) DAILY BY MOUTH. (Patient taking differently: Take 5 mg by mouth daily. ) 30 tablet 0  . XARELTO 15 MG TABS tablet TAKE 1 TABLET (15 MG TOTAL) BY MOUTH DAILY WITH SUPPER. (Patient taking differently: Take 15 mg by mouth daily with supper. ) 30 tablet 5   No current facility-administered medications on file prior to visit.      Past Medical History:  Diagnosis Date  . CAD (coronary artery disease), native coronary artery    PTCA of distal right coronary artery 1997 Cypher stents to circumflex 2005 Cardiac cath in 2011 with patent stent to circumflex and moderate disease elsewhere treated medically   . Cardiac pacemaker in situ 12/24/2015   Original implant reportedly in 1991 for tachybradycardia syndrome, generator change in 1999, 2004 and evidently again in 2016 in [place]   . COPD (chronic obstructive pulmonary disease) ([place])   . Diabetes mellitus without complication ([place])   . Hypertension   . Hypothyroidism 03/23/2010  . Pacemaker   . Paroxysmal atrial fibrillation ([place]) 03/23/2010   CHA2DS2VASC score 5    No Known  Allergies  Social History   Socioeconomic History  . Marital status: Widowed    Spouse name: Not on file  . Number of children: Not on file  . Years of education: Not on file  . Highest education level: Not on file  Occupational History  . Not on file  Social Needs  . Financial resource strain: Not on file  . Food insecurity:    Worry: Not on file    Inability: Not on file  . Transportation needs:    Medical: Not on file    Non-medical: Not on file  Tobacco Use  . Smoking status: Current Every Day Smoker    Packs/day: 0.30  . Smokeless tobacco: Never Used  Substance and Sexual Activity  . Alcohol use: No    Alcohol/week: 0.0 standard drinks  . Drug use: No  . Sexual activity: Not on file  Lifestyle  . Physical  activity:    Days per week: Not on file    Minutes per session: Not on file  . Stress: Not on file  Relationships  . Social connections:    Talks on phone: Not on file    Gets together: Not on file    Attends religious service: Not on file    Active member of club or organization: Not on file    Attends meetings of clubs or organizations: Not on file    Relationship status: Not on file  Other Topics Concern  . Not on file  Social History Narrative  . Not on file    Vitals:   11/12/18 0859  BP: 120/68  Pulse: 74  Resp: 16  Temp: 98.4 F (36.9 C)  SpO2: 98%   Body mass index is 24.6 kg/m.   Physical Exam  Nursing note and vitals reviewed. Constitutional: She is oriented to person, place, and time. She appears well-developed and well-nourished. No distress.  HENT:  Head: Normocephalic and atraumatic.  Mouth/Throat: Oropharynx is clear and moist and mucous membranes are normal. She has dentures.  Eyes: Pupils are equal, round, and reactive to light. Conjunctivae are normal.  Cardiovascular: Normal rate and regular rhythm.  No murmur heard. Pulses:      Dorsalis pedis pulses are 2+ on the right side and 2+ on the left side.  Respiratory: Effort  normal and breath sounds normal. No respiratory distress.  GI: Soft. She exhibits no mass. There is no hepatomegaly. There is no abdominal tenderness.  Musculoskeletal:        General: No edema.     Cervical back: She exhibits decreased range of motion and tenderness.       Back:     Comments: Cervical movement elicits pain.  Lymphadenopathy:    She has no cervical adenopathy.  Neurological: She is alert and oriented to person, place, and time. She has normal strength.  Otherwise stable gait with no assistance.  Skin: Skin is warm. No rash noted. No erythema.  Psychiatric: She has a normal mood and affect.  Well groomed, good eye contact.   Diabetic Foot Exam - Simple   Simple Foot Form Diabetic Foot exam was performed with the following findings:  Yes 11/12/2018  9:43 AM  Visual Inspection No deformities, no ulcerations, no other skin breakdown bilaterally:  Yes Sensation Testing Intact to touch and monofilament testing bilaterally:  Yes Pulse Check Posterior Tibialis and Dorsalis pulse intact bilaterally:  Yes Comments     ASSESSMENT AND PLAN:   Ms. Triston was seen today for right shoulder pain.  Orders Placed This Encounter  Procedures  . Microalbumin / creatinine urine ratio  . Ambulatory referral to Physical Therapy  . POCT glycosylated hemoglobin (Hb A1C)    Lab Results  Component Value Date   HGBA1C 5.3 11/12/2018   Lab Results  Component Value Date   MICROALBUR 4.2 (H) 11/12/2018    Neck pain We discussed possible etiologies. Cervical CT showed degenerative changes. Continue Local heat prn. Topical Voltaren qid prn. Side effects of muscle relaxants discussed. PT will be arranged.  -     tizanidine (ZANAFLEX) 2 MG capsule; Take 1 capsule (2 mg total) by mouth at bedtime for 15 days. -     Ambulatory referral to Physical Therapy  COPD (chronic obstructive pulmonary disease) ([place]) Problem is not well controlled. We discussed some side effects of  albuterol as well as some options for acute treatment. I recommend starting Symbicort 80-4.5 twice  daily. She does not want to continue albuterol and she does not want to try another inhaler prn. Smoking cessation strongly recommended. Follow-up in 3 to 4 months.  Type 2 diabetes mellitus with other specified complication (HCC) BTD9R at goal,5.3 Recommend stopping glipizide. She will continue Tradjenta 5 mg daily. Regular exercise as tolerated and healthy diet with avoidance of added sugar food intake is an important part of treatment and recommended. Annual eye exam, periodic dental and foot care recommended. F/U in 4 months   Tobacco use disorder Educated about adverse effects of tobacco use and benefits of smoking cessation. She agrees with trying Nicotine patches,side effects discussed. Instructed not to use it if she is smoking.  -     nicotine (NICODERM CQ - DOSED IN MG/24 HOURS) 14 mg/24hr patch; Place 1 patch (14 mg total) onto the skin daily.   Other orders -     diclofenac sodium (VOLTAREN) 1 % GEL; Apply 4 g topically 4 (four) times daily.    Return in about 4 months (around 03/13/2019) for COPD,DM.    Jong Rickman G. [name], MD  [facility]. [place] office.

## 2018-11-12 NOTE — Patient Instructions (Addendum)
A few things to remember from today's visit:   Chronic bronchitis, unspecified chronic bronchitis type ([place]) - Plan: budesonide-formoterol (SYMBICORT) 80-4.5 MCG/ACT inhaler  Tobacco use disorder - Plan: nicotine (NICODERM CQ - DOSED IN MG/24 HOURS) 14 mg/24hr patch  Neck pain - Plan: tizanidine (ZANAFLEX) 2 MG capsule, Ambulatory referral to Physical Therapy  Type 2 diabetes mellitus with other specified complication, without long-term current use of insulin ([name]) - Plan: POCT glycosylated hemoglobin (Hb A1C), Microalbumin / creatinine urine ratio  Encouraged smoking cessation. PT will be arranged. Zanaflex can cause sleepiness.  Do not use nicotine if smoking. Stop Glipizide.   Please be sure medication list is accurate. If a new problem present, please set up appointment sooner than planned today.

## 2018-11-12 NOTE — Assessment & Plan Note (Signed)
HgA1C at goal,5.3 Recommend stopping glipizide. She will continue Tradjenta 5 mg daily. Regular exercise as tolerated and healthy diet with avoidance of added sugar food intake is an important part of treatment and recommended. Annual eye exam, periodic dental and foot care recommended. F/U in 4 months

## 2018-11-12 NOTE — Assessment & Plan Note (Signed)
Problem is not well controlled. We discussed some side effects of albuterol as well as some options for acute treatment. I recommend starting Symbicort 80-4.5 twice daily. She does not want to continue albuterol and she does not want to try another inhaler prn. Smoking cessation strongly recommended. Follow-up in 3 to 4 months.

## 2018-11-12 NOTE — Telephone Encounter (Signed)
Noted pt. Was in the office today and Zanaflex was ordered.  Attempted to contact pt.  Left message with son-in-law, that the medication she requested has been ordered by the PCP.

## 2018-11-20 ENCOUNTER — Other Ambulatory Visit: Payer: Self-pay

## 2018-11-20 ENCOUNTER — Encounter: Payer: Self-pay | Admitting: Physical Therapy

## 2018-11-20 ENCOUNTER — Ambulatory Visit: Payer: Medicare Other | Attending: Family Medicine | Admitting: Physical Therapy

## 2018-11-20 DIAGNOSIS — M25611 Stiffness of right shoulder, not elsewhere classified: Secondary | ICD-10-CM | POA: Diagnosis not present

## 2018-11-20 DIAGNOSIS — M542 Cervicalgia: Secondary | ICD-10-CM | POA: Insufficient documentation

## 2018-11-20 DIAGNOSIS — M6281 Muscle weakness (generalized): Secondary | ICD-10-CM | POA: Diagnosis not present

## 2018-11-20 NOTE — Therapy (Signed)
[facility] [address] [place], [place], [address] Phone: [phone]   Fax:  [phone]  Physical Therapy Evaluation  Patient Details  Name: [name] MRN: [id number] Date of Birth: Mar 10, 1935 Referring Provider (PT): Dr. [name]    Encounter Date: 11/20/2018  PT End of Session - 11/20/18 2051    Visit Number  1    Date for PT Re-Evaluation  01/15/19    Authorization Type  [employer] Medicare/Medicaid    PT Start Time  1033   pt late   PT Stop Time  1115    PT Time Calculation (min)  42 min    Activity Tolerance  Patient tolerated treatment well       Past Medical History:  Diagnosis Date  . CAD (coronary artery disease), native coronary artery    PTCA of distal right coronary artery 1997 Cypher stents to circumflex 2005 Cardiac cath in 2011 with patent stent to circumflex and moderate disease elsewhere treated medically   . Cardiac pacemaker in situ 12/24/2015   Original implant reportedly in 1991 for tachybradycardia syndrome, generator change in 1999, 2004 and evidently again in 2016 in [place]   . COPD (chronic obstructive pulmonary disease) ([place])   . Diabetes mellitus without complication ([place])   . Hypertension   . Hypothyroidism 03/23/2010  . Pacemaker   . Paroxysmal atrial fibrillation ([place]) 03/23/2010   CHA2DS2VASC score 5     Past Surgical History:  Procedure Laterality Date  . CARDIAC CATHETERIZATION N/A 12/26/2015   Procedure: Left Heart Cath and Coronary Angiography;  Surgeon: Peter M [name], MD;  Location: [facility];  Service: Cardiovascular;  Laterality: N/A;  . CARDIOVERSION  05/2015  . CHOLECYSTECTOMY  2012  . CORONARY ANGIOPLASTY WITH STENT PLACEMENT  1990  . HERNIA REPAIR    . PACEMAKER GENERATOR CHANGE  [id number], 2016  . PACEMAKER INSERTION  1991    There were no vitals filed for this visit.   Subjective Assessment - 11/20/18 1037    Subjective  Having right and left neck  and upper trap region on 2/7 for no apparent reason.  Felt like I slept wrong.  I wanted to make sure it wasn't chest pain.   The doctor ruled out cardiac.  It's some better with Voltaren and heat.      Pertinent History  Pacemaker, COPD, DM, HTN;  fiance passed away 2 years ago in [place] and I moved back and I still don't have all my things back ; just started Nicotine patch today to stop smoking; osteoporosis with right femur fx from falling out of bed 3-4 years ago     Limitations  Sitting    How long can you sit comfortably?  several hours playing on tablet    Diagnostic tests  at the hospital had x-ray "arthritis"     Patient Stated Goals  get feeling better    Currently in Pain?  Yes    Pain Score  0-No pain    Pain Location  Neck    Pain Orientation  Left;Right   starts on right but can radiate to the left   Pain Type  Acute pain    Aggravating Factors   lying on bed to play with tablet; turning to the right;  sleep on right side bothers the left ; lift arm overhead     Pain Relieving Factors  heat, pain gel          [facility] PT Assessment -  11/20/18 0001      Assessment   Medical Diagnosis  neck pain     Referring Provider (PT)  Dr. [name]     Onset Date/Surgical Date  11/07/18    Hand Dominance  Right    Next MD Visit  4 weeks    Prior Therapy  shoulder PT before (it as painful)       Precautions   Precautions  ICD/Pacemaker      Restrictions   Weight Bearing Restrictions  No      Balance Screen   Has the patient fallen in the past 6 months  No      [place] residence    Living Arrangements  Children    Available Help at Discharge  Family    Type of [place] Access  Level entry    [place]  One level      Prior Function   Level of Independence  Independent with basic ADLs    Leisure  play on tablet, crochet, crosswords       Observation/Other Assessments   Focus on Therapeutic Outcomes (FOTO)   48% limitation        Posture/Postural Control   Postural Limitations  Rounded Shoulders;Forward head;Increased thoracic kyphosis    Posture Comments  barrel chest typical for COPD;  elevated shoulders      AROM   Right Shoulder Flexion  125 Degrees    Right Shoulder ABduction  160 Degrees   with compenstation   Right Shoulder Internal Rotation  --   T10 painful   Right Shoulder External Rotation  54 Degrees    Left Shoulder Flexion  155 Degrees    Left Shoulder ABduction  180 Degrees    Left Shoulder Internal Rotation  --   T 6   Left Shoulder External Rotation  40 Degrees    Cervical Flexion  35    Cervical Extension  50    Cervical - Right Side Bend  24    Cervical - Left Side Bend  25    Cervical - Right Rotation  48    Cervical - Left Rotation  40      Strength   Overall Strength Comments  right shoulder pain limited with resisted movements     Right Shoulder Flexion  3+/5    Right Shoulder ABduction  3+/5    Right Shoulder Internal Rotation  3+/5    Right Shoulder External Rotation  3+/5    Left Shoulder Flexion  4/5    Left Shoulder ABduction  4/5    Left Shoulder Internal Rotation  4/5    Left Shoulder External Rotation  4/5      Flexibility   Soft Tissue Assessment /Muscle Length  yes      Palpation   Palpation comment  decrease muscle lengths bil upper traps, suboccipitals       Distraction Test   Findngs  Negative    Comment  no change                Objective measurements completed on examination: See above findings.              PT Education - 11/20/18 1124    Education Details   Access Code: [email or web address]  supine shoulder clasped hand elevation, shoulder blade squeezes, seated upper trap stretch     Person(s) Educated  Patient    Methods  Explanation;Demonstration;Handout  Comprehension  Verbalized understanding;Returned demonstration       PT Short Term Goals - 11/20/18 2103      PT SHORT TERM GOAL #1   Title  The patient will demonstrate  understanding of basic ROM and self care strategies    Time  4    Period  Weeks    Status  New    Target Date  12/18/18      PT SHORT TERM GOAL #2   Title  The patient will report a 30% improvement in neck/right shoulder pain with sleeping, using her tablet and using right arm with ADLs    Time  4    Period  Weeks    Status  New      PT SHORT TERM GOAL #3   Title  The patient will have improved shoulder flexion to 140 degrees and internal rotation to T8 needed for usual ADLs    Time  4    Period  Weeks    Status  New        PT Long Term Goals - 11/20/18 2105      PT LONG TERM GOAL #1   Title  The patient will be independent in safe self progression of HEP     Time  8    Period  Weeks    Status  New    Target Date  01/15/19      PT LONG TERM GOAL #2   Title  The patient will report a 60% improvement in neck /right shoulder pain with usual ADLs    Time  8    Period  Weeks    Status  New      PT LONG TERM GOAL #3   Title  The patient will have improved cervical flexion to 40 degrees, sidebending to 30 degrees and rotation to 50 degrees for ADLs    Time  8    Period  Weeks    Status  New      PT LONG TERM GOAL #4   Title  The patient will have improved shoulder elevation to 150 degrees needed for reaching overhead    Time  8    Status  New      PT LONG TERM GOAL #5   Title  FOTO functional outcome score improved from 48% limitation to 38% indicating improved function with less pain     Time  8    Period  Weeks    Status  New             Plan - 11/20/18 1125    Clinical Impression Statement  The patient reports the onset of right neck pain radiating to the left side of her neck for no apparent reason on 11/07/18.  She reports she was concerned it was heart related but states this was ruled out and determined to be related to the arthritis in her neck.  She reports her pain is aggravated with sitting to long using her computer tablet, turning to the right,  sleeping on the right side and reaching overhead.  She has very limited right shoulder ROM:  flexion 125, abduction, 160, external rotation 54, internal rotation to T10.  Her cervical ROM is limted as well in all planes.  Glenohumeral strength on the right is grossly 3+/5 and pain limited and 3+/5 scapular and cervical muscle strength.  Moderate postural changes include increased thoracic kyphosis, elevated shoulders and barrel chest typical of COPD.  Tender points and  decreased muscle length bil upper traps, cervical paraspinals and suboccipitals.  She would benefit from PT to address these deficits.      History and Personal Factors relevant to plan of care:  numerous co-morbidities including COPD, osteoporosis, pacemaker, diabetes and hypertension;   lack of transportation;  psychosocial issues=death of fiance,personal  belongings still in [place]; sedentary lifestyle     Clinical Presentation  Evolving    Clinical Decision Making  Moderate    Rehab Potential  Good    Clinical Impairments Affecting Rehab Potential  PACEMAKER NO ES; COPD; osteoporosis    PT Frequency  1x / week   pt preference; transportation issues   PT Duration  8 weeks    PT Treatment/Interventions  Taping;Dry needling;ADLs/Self Care Home Management    PT Next Visit Plan  manual therapy; shoulder and cervical ROM; postural ex's; heat;  NO ES    PT Home Exercise Plan   Access Code: [email or web address]     Consulted and Agree with Plan of Care  Patient       Patient will benefit from skilled therapeutic intervention in order to improve the following deficits and impairments:  Pain, Increased muscle spasms, Postural dysfunction, Decreased range of motion, Decreased strength, Impaired UE functional use  Visit Diagnosis: Cervicalgia - Plan: PT plan of care cert/re-cert  Stiffness of right shoulder, not elsewhere classified - Plan: PT plan of care cert/re-cert  Muscle weakness (generalized) - Plan: PT plan of care cert/re-cert     Problem  List Patient Active Problem List   Diagnosis Date Noted  . Chronic rhinitis 02/28/2018  . Tobacco use disorder 05/10/2017  . Hypertension with heart disease 05/10/2017  . Chest pain 03/10/2017  . Acute thoracic back pain   . Cardiac pacemaker in situ 12/24/2015  . History of Graves' disease 12/24/2015  . Current use of long term anticoagulation 12/24/2015  . Type 2 diabetes mellitus with other specified complication ([place])   . Hypothyroidism 03/23/2010  . Paroxysmal atrial fibrillation ([place]) 03/23/2010  . COPD (chronic obstructive pulmonary disease) ([place]) 03/23/2010  . CAD (coronary artery disease), native coronary artery   . GERD    Ruben Im, PT 11/20/18 9:12 PM Phone: [phone] Fax: [phone] Alvera Singh 11/20/2018, 9:12 PM  [facility] [address] [place], [place], [address] Phone: [phone]   Fax:  [phone]  Name: [name] MRN: [id number] Date of Birth: 1935-06-06

## 2018-11-20 NOTE — Patient Instructions (Signed)
Access Code: [email or web address]  URL: [email or web address]  Date: 11/20/2018  Prepared by: Ruben Im   Exercises  Supine Shoulder Flexion AAROM - 10 reps - 1 sets - 1x daily - 7x weekly  Seated Scapular Retraction - 10 reps - 1 sets - 1x daily - 7x weekly  Seated Cervical Sidebending Stretch - 3 reps - 1 sets - 30 hold - 1x daily - 7x weekly

## 2018-11-25 ENCOUNTER — Ambulatory Visit: Payer: Medicare Other | Admitting: Physical Therapy

## 2018-11-25 ENCOUNTER — Telehealth: Payer: Self-pay | Admitting: Physical Therapy

## 2018-11-25 NOTE — Telephone Encounter (Signed)
No show. PT called and spoke with pt. She forgot her appointment was today but confirmed her upcoming appointment.   12:00 PM,11/25/18 [place], [place] at [place]

## 2018-11-28 ENCOUNTER — Other Ambulatory Visit: Payer: Self-pay | Admitting: Family Medicine

## 2018-11-28 DIAGNOSIS — E119 Type 2 diabetes mellitus without complications: Secondary | ICD-10-CM

## 2018-12-02 ENCOUNTER — Other Ambulatory Visit: Payer: Self-pay | Admitting: *Deleted

## 2018-12-02 ENCOUNTER — Encounter: Payer: Medicare Other | Admitting: Physical Therapy

## 2018-12-02 MED ORDER — TIZANIDINE HCL 2 MG PO TABS: 2.0000 mg | ORAL_TABLET | Freq: Every day | ORAL | 0 refills | Status: AC

## 2018-12-09 ENCOUNTER — Ambulatory Visit: Payer: Medicare Other | Attending: Family Medicine | Admitting: Physical Therapy

## 2018-12-09 ENCOUNTER — Encounter: Payer: Self-pay | Admitting: Physical Therapy

## 2018-12-09 DIAGNOSIS — M6281 Muscle weakness (generalized): Secondary | ICD-10-CM | POA: Diagnosis not present

## 2018-12-09 DIAGNOSIS — M542 Cervicalgia: Secondary | ICD-10-CM | POA: Insufficient documentation

## 2018-12-09 DIAGNOSIS — M25611 Stiffness of right shoulder, not elsewhere classified: Secondary | ICD-10-CM

## 2018-12-09 NOTE — Therapy (Addendum)
[facility] [address], [place], [place], [address] Phone: [phone]   Fax:  [phone]  Physical Therapy Treatment/Discharge  Patient Details  Name: [name] MRN: [id number] Date of Birth: 08/16/1935 Referring Provider (PT): Dr. [name]    Encounter Date: 12/09/2018  PT End of Session - 12/09/18 1141    Visit Number  2    Date for PT Re-Evaluation  01/15/19    Authorization Type  [employer]HC Medicare/Medicaid    PT Start Time  1100    PT Stop Time  1140    PT Time Calculation (min)  40 min    Activity Tolerance  Patient tolerated treatment well;No increased pain       Past Medical History:  Diagnosis Date  . CAD (coronary artery disease), native coronary artery    PTCA of distal right coronary artery 1997 Cypher stents to circumflex 2005 Cardiac cath in 2011 with patent stent to circumflex and moderate disease elsewhere treated medically   . Cardiac pacemaker in situ 12/24/2015   Original implant reportedly in 1991 for tachybradycardia syndrome, generator change in 1999, 2004 and evidently again in 2016 in [place]   . COPD (chronic obstructive pulmonary disease) ([place])   . Diabetes mellitus without complication ([place])   . Hypertension   . Hypothyroidism 03/23/2010  . Pacemaker   . Paroxysmal atrial fibrillation ([place]) 03/23/2010   CHA2DS2VASC score 5     Past Surgical History:  Procedure Laterality Date  . CARDIAC CATHETERIZATION N/A 12/26/2015   Procedure: Left Heart Cath and Coronary Angiography;  Surgeon: Peter M [name], MD;  Location: [facility];  Service: Cardiovascular;  Laterality: N/A;  . CARDIOVERSION  05/2015  . CHOLECYSTECTOMY  2012  . CORONARY ANGIOPLASTY WITH STENT PLACEMENT  1990  . HERNIA REPAIR    . PACEMAKER GENERATOR CHANGE  [id number], 2016  . PACEMAKER INSERTION  1991    There were no vitals filed for this visit.  Subjective Assessment - 12/09/18 1102    Subjective  Pt states  that things are going well. She was doing some of her exercises. She has no pain currently.     Pertinent History  Pacemaker, COPD, DM, HTN;  fiance passed away 2 years ago in [place] and I moved back and I still don't have all my things back ; just started Nicotine patch today to stop smoking; osteoporosis with right femur fx from falling out of bed 3-4 years ago     Limitations  Sitting    How long can you sit comfortably?  several hours playing on tablet    Diagnostic tests  at the hospital had x-ray "arthritis"     Patient Stated Goals  get feeling better    Currently in Pain?  No/denies         [facility] PT Assessment - 12/09/18 0001      AROM   Cervical - Right Rotation  55   pain Rt upper trap   Cervical - Left Rotation  60                   [facility] Adult PT Treatment/Exercise - 12/09/18 0001      Exercises   Exercises  Neck      Neck Exercises: Seated   Cervical Isometrics  Left lateral flexion;Right lateral flexion;5 reps;5 secs    Cervical Rotation  Both;10 reps    Cervical Rotation Limitations  into pain free ranges    Shoulder Rolls  20 reps;Backwards    Other Seated Exercise  shoulder external rotation active assist x10 reps     Other Seated Exercise  shoulder pulleys x2 min flexion, abduction      Neck Exercises: Supine   Other Supine Exercise  scapular retraction x15 reps (heavy cuing needed to decrease shoulder shrug)     Other Supine Exercise  horizontal abduction with pec stretch end range x15 reps       Manual Therapy   Manual Therapy  Myofascial release    Myofascial Release  trigger point release Rt upper trap, Rt levator scap              PT Education - 12/09/18 1143    Education Details  technique with therex; posture correction at home     Person(s) Educated  Patient    Methods  Explanation    Comprehension  Verbalized understanding       PT Short Term Goals - 12/09/18 1142      PT SHORT TERM GOAL #1   Title  The patient will  demonstrate understanding of basic ROM and self care strategies    Time  4    Period  Weeks    Status  On-going    Target Date  12/18/18      PT SHORT TERM GOAL #2   Title  The patient will report a 30% improvement in neck/right shoulder pain with sleeping, using her tablet and using right arm with ADLs    Time  4    Period  Weeks    Status  New      PT SHORT TERM GOAL #3   Title  The patient will have improved shoulder flexion to 140 degrees and internal rotation to T8 needed for usual ADLs    Time  4    Period  Weeks    Status  New        PT Long Term Goals - 12/09/18 1142      PT LONG TERM GOAL #1   Title  The patient will be independent in safe self progression of HEP     Time  8    Period  Weeks    Status  New      PT LONG TERM GOAL #2   Title  The patient will report a 60% improvement in neck /right shoulder pain with usual ADLs    Time  8    Period  Weeks    Status  New      PT LONG TERM GOAL #3   Title  The patient will have improved cervical flexion to 40 degrees, sidebending to 30 degrees and rotation to 50 degrees for ADLs    Time  8    Period  Weeks    Status  New      PT LONG TERM GOAL #4   Title  The patient will have improved shoulder elevation to 150 degrees needed for reaching overhead    Time  8    Status  New      PT LONG TERM GOAL #5   Title  FOTO functional outcome score improved from 48% limitation to 38% indicating improved function with less pain     Time  8    Period  Weeks    Status  New            Plan - 12/09/18 1141    Clinical Impression Statement  Pt arrived today without reports of cervical  pain at rest. She demonstrates forward head and rounded shoulders, requiring intermittent cues from the therapist to improve her posture awareness. Pt's cervical rotation active ROM has improved since her evaluation, increased to greater than 50 deg bilaterally. She does have pain rotating to the Rt despite improvements in her range.  Session focused on therex to improve scapular strength and cervical ROM. Ended with manual treatment and trigger point release to the Rt upper trap in order to improve tissue mobility.     Rehab Potential  Good    Clinical Impairments Affecting Rehab Potential  PACEMAKER NO ES; COPD; osteoporosis    PT Frequency  1x / week   pt preference; transportation issues   PT Duration  8 weeks    PT Treatment/Interventions  Taping;Dry needling;ADLs/Self Care Home Management    PT Next Visit Plan  possible d/n if pt agreeable; shoulder and cervical ROM; postural ex's; heat;  NO ES    PT Home Exercise Plan   Access Code: [email or web address]     Consulted and Agree with Plan of Care  Patient       Patient will benefit from skilled therapeutic intervention in order to improve the following deficits and impairments:  Pain, Increased muscle spasms, Postural dysfunction, Decreased range of motion, Decreased strength, Impaired UE functional use  Visit Diagnosis: Cervicalgia  Stiffness of right shoulder, not elsewhere classified  Muscle weakness (generalized)     Problem List Patient Active Problem List   Diagnosis Date Noted  . Chronic rhinitis 02/28/2018  . Tobacco use disorder 05/10/2017  . Hypertension with heart disease 05/10/2017  . Chest pain 03/10/2017  . Acute thoracic back pain   . Cardiac pacemaker in situ 12/24/2015  . History of Graves' disease 12/24/2015  . Current use of long term anticoagulation 12/24/2015  . Type 2 diabetes mellitus with other specified complication ([place])   . Hypothyroidism 03/23/2010  . Paroxysmal atrial fibrillation ([place]) 03/23/2010  . COPD (chronic obstructive pulmonary disease) ([place]) 03/23/2010  . CAD (coronary artery disease), native coronary artery   . GERD     11:44 AM,12/09/18 Sherol Dade PT, DPT [facility] [address] [place], [place], [address] Phone: [phone]   Fax:  [phone]  Name: [name] MRN: [id number] Date of Birth: September 07, 1935  *addendum to resolve episode of care and d/c pt from [facility]  Visits from Start of Care: 2  Current functional level related to goals / functional outcomes: See above for more details    Remaining deficits: See above for more details    Education / Equipment: See above for more details   Plan: Patient agrees to discharge.  Patient goals were not met. Patient is being discharged due to the patient's request.  ?????           9:01 AM,02/10/19 Sherol Dade PT, [facility]

## 2018-12-09 NOTE — Patient Instructions (Signed)

## 2018-12-13 ENCOUNTER — Other Ambulatory Visit: Payer: Self-pay | Admitting: Family Medicine

## 2018-12-14 ENCOUNTER — Other Ambulatory Visit: Payer: Self-pay | Admitting: Family Medicine

## 2018-12-14 DIAGNOSIS — E119 Type 2 diabetes mellitus without complications: Secondary | ICD-10-CM

## 2018-12-14 DIAGNOSIS — J42 Unspecified chronic bronchitis: Secondary | ICD-10-CM

## 2018-12-17 ENCOUNTER — Other Ambulatory Visit: Payer: Self-pay | Admitting: *Deleted

## 2018-12-17 MED ORDER — LOSARTAN POTASSIUM 50 MG PO TABS: ORAL_TABLET | ORAL | 2 refills | Status: DC

## 2018-12-22 ENCOUNTER — Other Ambulatory Visit: Payer: Self-pay | Admitting: Family Medicine

## 2018-12-22 DIAGNOSIS — F17200 Nicotine dependence, unspecified, uncomplicated: Secondary | ICD-10-CM

## 2018-12-23 ENCOUNTER — Encounter: Payer: Medicare Other | Admitting: Physical Therapy

## 2018-12-23 ENCOUNTER — Ambulatory Visit: Payer: Medicare Other | Admitting: Physical Therapy

## 2018-12-26 ENCOUNTER — Encounter

## 2018-12-26 NOTE — Telephone Encounter (Signed)
Spoke with patient and she stated that she does not need refill on Zanaflex.

## 2018-12-26 NOTE — Telephone Encounter (Signed)
Can you please arrange a webex visit. Thanks, BJ

## 2018-12-30 ENCOUNTER — Telehealth: Payer: Self-pay

## 2018-12-30 NOTE — Telephone Encounter (Signed)
Author phoned pt. to assess interest in scheduling virtual awv. Author explained nature of awv, and pt. stated she could not do virtually, and would wait to schedule an OV with Dr. [name] in June for June. Pt. Not interested in awv at this time.

## 2019-01-02 ENCOUNTER — Encounter: Payer: Medicare Other | Admitting: Physical Therapy

## 2019-01-08 ENCOUNTER — Encounter: Payer: Medicare Other | Admitting: Physical Therapy

## 2019-01-11 ENCOUNTER — Other Ambulatory Visit: Payer: Self-pay | Admitting: Family Medicine

## 2019-01-15 ENCOUNTER — Ambulatory Visit: Payer: Medicare Other | Admitting: Physical Therapy

## 2019-01-16 ENCOUNTER — Other Ambulatory Visit: Payer: Self-pay | Admitting: Pharmacist

## 2019-01-16 MED ORDER — RIVAROXABAN 15 MG PO TABS: 15.0000 mg | ORAL_TABLET | Freq: Every day | ORAL | 4 refills | Status: DC

## 2019-01-16 NOTE — Telephone Encounter (Signed)
Last OV 08/26/2018 61kg Female 83yo Scr=0.87 (11/07/2018) estCrCl = 27ml/min

## 2019-01-21 ENCOUNTER — Telehealth: Payer: Self-pay | Admitting: Cardiology

## 2019-01-21 NOTE — Telephone Encounter (Signed)
New message      Phone visit scheduled for 01-28-19.  Pt stated that she did not have a cell phone or email address.  Pt gave her consent for a phone visit.  YOUR CARDIOLOGY TEAM HAS ARRANGED FOR AN E-VISIT FOR YOUR APPOINTMENT - PLEASE REVIEW IMPORTANT INFORMATION BELOW SEVERAL DAYS PRIOR TO YOUR APPOINTMENT  Due to the recent COVID-19 pandemic, we are transitioning in-person office visits to tele-medicine visits in an effort to decrease unnecessary exposure to our patients, their families, and staff. These visits are billed to your insurance just like a normal visit is. We also encourage you to sign up for MyChart if you have not already done so. You will need a smartphone if possible. For patients that do not have this, we can still complete the visit using a regular telephone but do prefer a smartphone to enable video when possible. You may have a family member that lives with you that can help. If possible, we also ask that you have a blood pressure cuff and scale at home to measure your blood pressure, heart rate and weight prior to your scheduled appointment. Patients with clinical needs that need an in-person evaluation and testing will still be able to come to the office if absolutely necessary. If you have any questions, feel free to call our office.     YOUR PROVIDER WILL BE USING THE FOLLOWING PLATFORM TO COMPLETE YOUR VISIT: Phone   IF USING MYCHART - How to Download the MyChart App to Your SmartPhone   - If Apple, go to [employer] and type in MyChart in the search bar and download the app. If Android, ask patient to go to [employer] and type in [place] in the search bar and download the app. The app is free but as with any other app downloads, your phone may require you to verify saved payment information or Apple/Android password.  - You will need to then log into the app with your MyChart username and password, and select [facility] as your healthcare provider to link the  account.  - When it is time for your visit, go to the MyChart app, find appointments, and click Begin Video Visit. Be sure to Select Allow for your device to access the Microphone and Camera for your visit. You will then be connected, and your provider will be with you shortly.  **If you have any issues connecting or need assistance, please contact MyChart service desk [phone])**  **If using a computer, in order to ensure the best quality for your visit, you will need to use either of the following Internet Browsers: Insurance underwriter or Microsoft Edge**   IF USING DOXIMITY or [email or web address].ME - The staff will give you instructions on receiving your link to join the meeting the day of your visit.      2-3 DAYS BEFORE YOUR APPOINTMENT  You will receive a telephone call from one of our [facility] team members - your caller ID may say "Unknown caller." If this is a video visit, we will walk you through how to get the video launched on your phone. We will remind you check your blood pressure, heart rate and weight prior to your scheduled appointment. If you have an Apple Watch or Kardia, please upload any pertinent ECG strips the day before or morning of your appointment to [facility]. Our staff will also make sure you have reviewed the consent and agree to move forward with your scheduled tele-health visit.  THE DAY OF YOUR APPOINTMENT  Approximately 15 minutes prior to your scheduled appointment, you will receive a telephone call from one of [facility] team - your caller ID may say "Unknown caller."  Our staff will confirm medications, vital signs for the day and any symptoms you may be experiencing. Please have this information available prior to the time of visit start. It may also be helpful for you to have a pad of paper and pen handy for any instructions given during your visit. They will also walk you through joining the smartphone meeting if this is a video visit.    CONSENT FOR  TELE-HEALTH VISIT - PLEASE REVIEW  I hereby voluntarily request, consent and authorize [facility] and its employed or contracted physicians, physician assistants, nurse practitioners or other licensed health care professionals (the Practitioner), to provide me with telemedicine health care services (the Services") as deemed necessary by the treating Practitioner. I acknowledge and consent to receive the Services by the Practitioner via telemedicine. I understand that the telemedicine visit will involve communicating with the Practitioner through live audiovisual communication technology and the disclosure of certain medical information by electronic transmission. I acknowledge that I have been given the opportunity to request an in-person assessment or other available alternative prior to the telemedicine visit and am voluntarily participating in the telemedicine visit.  I understand that I have the right to withhold or withdraw my consent to the use of telemedicine in the course of my care at any time, without affecting my right to future care or treatment, and that the Practitioner or I may terminate the telemedicine visit at any time. I understand that I have the right to inspect all information obtained and/or recorded in the course of the telemedicine visit and may receive copies of available information for a reasonable fee.  I understand that some of the potential risks of receiving the Services via telemedicine include:   Delay or interruption in medical evaluation due to technological equipment failure or disruption;  Information transmitted may not be sufficient (e.g. poor resolution of images) to allow for appropriate medical decision making by the Practitioner; and/or   In rare instances, security protocols could fail, causing a breach of personal health information.  Furthermore, I acknowledge that it is my responsibility to provide information about my medical history, conditions and  care that is complete and accurate to the best of my ability. I acknowledge that Practitioner's advice, recommendations, and/or decision may be based on factors not within their control, such as incomplete or inaccurate data provided by me or distortions of diagnostic images or specimens that may result from electronic transmissions. I understand that the practice of medicine is not an exact science and that Practitioner makes no warranties or guarantees regarding treatment outcomes. I acknowledge that I will receive a copy of this consent concurrently upon execution via email to the email address I last provided but may also request a printed copy by calling the office of [facility].    I understand that my insurance will be billed for this visit.   I have read or had this consent read to me.  I understand the contents of this consent, which adequately explains the benefits and risks of the Services being provided via telemedicine.   I have been provided ample opportunity to ask questions regarding this consent and the Services and have had my questions answered to my satisfaction.  I give my informed consent for the services to be provided through the  use of telemedicine in my medical care  By participating in this telemedicine visit I agree to the above.

## 2019-01-28 ENCOUNTER — Telehealth (INDEPENDENT_AMBULATORY_CARE_PROVIDER_SITE_OTHER): Payer: Medicare Other | Admitting: Cardiology

## 2019-01-28 ENCOUNTER — Other Ambulatory Visit: Payer: Self-pay

## 2019-01-28 ENCOUNTER — Encounter: Payer: Self-pay | Admitting: Cardiology

## 2019-01-28 VITALS — Ht 62.0 in | Wt 134.0 lb

## 2019-01-28 DIAGNOSIS — I2510 Atherosclerotic heart disease of native coronary artery without angina pectoris: Secondary | ICD-10-CM | POA: Diagnosis not present

## 2019-01-28 DIAGNOSIS — Z720 Tobacco use: Secondary | ICD-10-CM

## 2019-01-28 DIAGNOSIS — I480 Paroxysmal atrial fibrillation: Secondary | ICD-10-CM

## 2019-01-28 DIAGNOSIS — E119 Type 2 diabetes mellitus without complications: Secondary | ICD-10-CM

## 2019-01-28 DIAGNOSIS — I119 Hypertensive heart disease without heart failure: Secondary | ICD-10-CM

## 2019-01-28 DIAGNOSIS — Z950 Presence of cardiac pacemaker: Secondary | ICD-10-CM

## 2019-01-28 DIAGNOSIS — E785 Hyperlipidemia, unspecified: Secondary | ICD-10-CM

## 2019-01-28 NOTE — Patient Instructions (Addendum)
Medication Instructions:  Your physician recommends that you continue on your current medications as directed. Please refer to the Current Medication list given to you today.  If you need a refill on your cardiac medications before your next appointment, please call your pharmacy.   Lab work: None ordered  If you have labs (blood work) drawn today and your tests are completely normal, you will receive your results only by: Marland Kitchen MyChart Message (if you have MyChart) OR . A paper copy in the mail If you have any lab test that is abnormal or we need to change your treatment, we will call you to review the results.  Testing/Procedures: None ordered  Follow-Up: At [facility], you and your health needs are our priority.  As part of our continuing mission to provide you with exceptional heart care, we have created designated Provider Care Teams.  These Care Teams include your primary Cardiologist (physician) and Advanced Practice Providers (APPs -  Physician Assistants and Nurse Practitioners) who all work together to provide you with the care you need, when you need it. You will need a follow up appointment in 6 months.  Please call our office 2 months in advance to schedule this appointment.  You may see [name], MD or one of the following Advanced Practice Providers on your designated Care Team:   Truitt Merle, NP Cecilie Kicks, NP . Kathyrn Drown, NP  Any Other Special Instructions Will Be Listed Below (If Applicable). Please go to a drug store and have your blood pressure checked on Friday and call us with the results at [phone] so we can make a decision on your medications.

## 2019-01-28 NOTE — Progress Notes (Signed)
Virtual Visit via Telephone Note   This visit type was conducted due to national recommendations for restrictions regarding the COVID-19 Pandemic (e.g. social distancing) in an effort to limit this patient's exposure and mitigate transmission in our community.  Due to her co-morbid illnesses, this patient is at least at moderate risk for complications without adequate follow up.  This format is felt to be most appropriate for this patient at this time.  The patient did not have access to video technology/had technical difficulties with video requiring transitioning to audio format only (telephone).  All issues noted in this document were discussed and addressed.  No physical exam could be performed with this format.  Please refer to the patient's chart for her  consent to telehealth for [facility].   Pt could not do virtual visit.  Evaluation Performed:  Follow-up visit  Date:  01/28/2019   ID:  Faythe Heitzenrater, DOB 1934-11-10, MRN [id number]  Patient Location: Home Provider Location: Office  PCP:  [name], [name], MD  Cardiologist:  [name], MD  Electrophysiologist:  Constance Haw, MD   Chief Complaint:  PAF, CAD, pacer.   History of Present Illness:    Jowana Thumma is a 84 y.o. female with a hx of tachycardia-bradycardia syndrome, permanent pacemaker, coronary artery disease with prior right coronary drug-eluting stent as well as circumflex cypher stent 2005. History of COPD, continue smoking, diabetes, hypertension, and paroxysmal atrial fibrillation. Currently on anticoagulation and Multaqrelated to atrial fib.  Does not tolerate BID dosing of Multaq due to diarrhea at higher dose.  target BP is 140/90  + tobacco use.  Anticoagulation on Xarelto LDL followed by her PCP.   Seen in ER 11/20/18 for Rt neck pain troponin neg  hgb was 10.7 and Cr 0.87 CXR with stable cardiomegaly.  Chronic interstitial changes. And CT of neck with degenerative changes at C6-7.    Today her  neck pain is doing well, has resolved.  She has no chest pain and unaware of any rapid HR.  Stable SOB with her COPD.  She has been having frontal headaches.  Also runny nose, cough and no fever.   We discussed she could try Claritin plan to see if it would help. Also not to sleep under the window.  She is taking her xarelto and multag without problems.  She is also on nicotine patch though she still smokes.  She is aware not to wear patch and smoke in a 24 hour period.  Her family makes her stick to this.  Her glucose is stying in low 100s.    She in not on losartan and with last BP at Dr. Doug Sou office she was on the med.  She could not take her BP today but I expressed concern that her BP could be up and causing headaches.  [facility]   She does wear masks when going around others.  She is mostly staying at home.  The patient does not have symptoms concerning for COVID-19 infection (fever, chills, cough, or new shortness of breath).    Past Medical History:  Diagnosis Date  . CAD (coronary artery disease), native coronary artery    PTCA of distal right coronary artery 1997 Cypher stents to circumflex 2005 Cardiac cath in 2011 with patent stent to circumflex and moderate disease elsewhere treated medically   . Cardiac pacemaker in situ 12/24/2015   Original implant reportedly in 1991 for tachybradycardia syndrome, generator  change in 1999, 2004 and evidently again in 2016 in [place]   . COPD (chronic obstructive pulmonary disease) ([place])   . Diabetes mellitus without complication ([place])   . Hypertension   . Hypothyroidism 03/23/2010  . Pacemaker   . Paroxysmal atrial fibrillation ([place]) 03/23/2010   CHA2DS2VASC score 5    Past Surgical History:  Procedure Laterality Date  . CARDIAC CATHETERIZATION N/A 12/26/2015   Procedure: Left Heart Cath and Coronary Angiography;  Surgeon: Peter M [name], MD;  Location: [place] [facility];  Service: Cardiovascular;   Laterality: N/A;  . CARDIOVERSION  05/2015  . CHOLECYSTECTOMY  2012  . CORONARY ANGIOPLASTY WITH STENT PLACEMENT  1990  . HERNIA REPAIR    . PACEMAKER GENERATOR CHANGE  [id number], 2016  . PACEMAKER INSERTION  1991     Current Meds  Medication Sig  . atorvastatin (LIPITOR) 10 MG tablet TAKE 1 TABLET BY MOUTH DAILY  . carvedilol (COREG) 12.5 MG tablet TAKE 1 TABLET (12.5 MG TOTAL) BY MOUTH 2 (TWO) TIMES DAILY WITH A MEAL.  . [facility] NICOTINE TRANSDERMAL SYS 14 MG/24HR patch PLACE 1 PATCH (14 MG TOTAL) ONTO THE SKIN DAILY.  Marland Kitchen diclofenac sodium (VOLTAREN) 1 % GEL Apply 4 g topically 4 (four) times daily.  Marland Kitchen dronedarone (MULTAQ) 400 MG tablet Take 400 mg by mouth daily.  Marland Kitchen esomeprazole (NEXIUM) 40 MG capsule TAKE ONE CAPSULE EVERY DAY  . fenofibrate (TRICOR) 48 MG tablet TAKE 1 TABLET EVERY DAY  . glucose blood test strip Use as instructed  . ipratropium (ATROVENT) 0.03 % nasal spray PLACE 2 SPRAYS INTO BOTH NOSTRILS EVERY 12 (TWELVE) HOURS.  Marland Kitchen levothyroxine (SYNTHROID, LEVOTHROID) 112 MCG tablet TAKE 1 TABLET (112 MCG TOTAL) BY MOUTH DAILY BEFORE BREAKFAST.  Marland Kitchen losartan (COZAAR) 100 MG tablet Take 1 tablet (100 mg total) by mouth daily.  Marland Kitchen losartan (COZAAR) 50 MG tablet Take 2 tablets ( 100 mg) by mouth daily  . metFORMIN (GLUCOPHAGE) 500 MG tablet TAKE 1 TABLET (500 MG TOTAL) BY MOUTH EVERY EVENING.  . nitroGLYCERIN (NITROSTAT) 0.4 MG SL tablet Place 0.4 mg under the tongue every 5 (five) minutes x 3 doses as needed for chest pain (Max 3 doses within 15 min. Call 911).  Marland Kitchen PROAIR HFA 108 (90 Base) MCG/ACT inhaler TAKE 2 PUFFS BY MOUTH EVERY 6 HOURS AS NEEDED FOR WHEEZE OR SHORTNESS OF BREATH*NOT COVERED*  . Rivaroxaban (XARELTO) 15 MG TABS tablet Take 1 tablet (15 mg total) by mouth daily with supper.  . TRADJENTA 5 MG TABS tablet TAKE 1 TABLET (5 MG TOTAL) DAILY BY MOUTH.     Allergies:   Patient has no known allergies.   Social History   Tobacco Use  . Smoking status: Current Every Day  Smoker    Packs/day: 0.30  . Smokeless tobacco: Never Used  Substance Use Topics  . Alcohol use: No    Alcohol/week: 0.0 standard drinks  . Drug use: No     Family Hx: The patient's family history includes Cirrhosis in her brother; Heart attack in her mother; Heart disease in her father and mother. There is no history of Colon cancer or Stomach cancer.  ROS:   Please see the history of present illness.    General:no colds, + allergy symptoms no fevers, no weight changes Skin:no rashes or ulcers HEENT:no blurred vision, no congestion CV:see HPI PUL:see HPI GI:no diarrhea constipation or melena, no indigestion GU:no hematuria, no dysuria MS:no joint pain, no claudication Neuro:no syncope, no lightheadedness Endo:+ diabetes, +  thyroid disease  All other systems reviewed and are negative.   Prior CV studies:   The following studies were reviewed today:  Echo 03/10/17 Study Conclusions  - Left ventricle: The cavity size was mildly dilated. Wall   thickness was increased in a pattern of mild LVH. The estimated   ejection fraction was 55%. Left ventricular diastolic function   parameters were normal. - Aortic valve: There was mild regurgitation. - Left atrium: The atrium was moderately dilated. - Atrial septum: No defect or patent foramen ovale was identified.  Cardiac cath 12/26/15  Prox RCA to Dist RCA lesion, 30% stenosed.  Prox LAD lesion, 40% stenosed.  Mid LAD to Dist LAD lesion, 45% stenosed.  Ost Cx to Prox Cx lesion, 10% stenosed. The lesion was previously treated with a drug-eluting stent greater than two years ago.  The left ventricular systolic function is normal.   1. Nonobstructive coronary artery disease- diffuse. 2. Good LV function  Plan: continue medical management.  Labs/Other Tests and Data Reviewed:    EKG:  An ECG dated 11/07/18 was personally reviewed today and demonstrated:  SR at 70 and PR 0.24 a pacing  Recent Labs: 11/07/2018: BUN 15;  Creatinine, Ser 0.87; Hemoglobin 10.7; Platelets 181; Potassium 3.8; Sodium 141   Recent Lipid Panel Lab Results  Component Value Date/Time   CHOL 132 03/10/2017 04:57 AM   TRIG 61 03/10/2017 04:57 AM   HDL 56 03/10/2017 04:57 AM   CHOLHDL 2.4 03/10/2017 04:57 AM   LDLCALC 64 03/10/2017 04:57 AM    Wt Readings from Last 3 Encounters:  01/28/19 134 lb (60.8 kg)  11/12/18 134 lb 8 oz (61 kg)  11/07/18 138 lb (62.6 kg)     Objective:    Vital Signs:  Ht 5\' 2"  (1.575 m)   Wt 134 lb (60.8 kg)   LMP  (LMP Unknown)   BMI 24.51 kg/m    VITAL SIGNS:  reviewed  Neuro A&O X 3 answers questions approp. Lungs: has to pause at times before completing sentence to take a breath, + productive sounding cough Psych pleasant affect.  ASSESSMENT & PLAN:    1. CAD with prior stent, no angina stable 2. PAF, no awareness of irregular HR.  Continue on Xarelto for anticoagulation and Multaq.  No changes 3. HTN, now off losartan.  She could not take BP at home so will take at drug store if elevated would resume losartan she will call Friday with results 4. Tobacco use, continues to smoke but is trying to stop wears nicotine patch at times.   5. Tachy/brady syndrome with PPM followed by Dr. Curt Bears and is stable. 6. hld followed by Dr. [name].  Continue lipitor and Tricor 7. DM-2 per Dr. [name] and controlled 8. Headache may be allergies but may be HTN  She will check , if she cannot would bring her in to see MD.    COVID-19 Education: The signs and symptoms of COVID-19 were discussed with the patient and how to seek care for testing (follow up with PCP or arrange E-visit).  The importance of social distancing was discussed today.  Time:   Today, I have spent 11 minutes with the patient with telehealth technology discussing the above problems.     Medication Adjustments/Labs and Tests Ordered: Current medicines are reviewed at length with the patient today.  Concerns regarding medicines are  outlined above.   Tests Ordered: No orders of the defined types were placed in this encounter.   Medication  Changes: No orders of the defined types were placed in this encounter.   Disposition:  Follow up in 6 month(s)  Signed, Cecilie Kicks, NP  01/28/2019 2:47 PM    [place]

## 2019-01-29 ENCOUNTER — Other Ambulatory Visit: Payer: Self-pay | Admitting: Cardiology

## 2019-01-29 MED ORDER — ATORVASTATIN CALCIUM 10 MG PO TABS: 10.0000 mg | ORAL_TABLET | Freq: Every day | ORAL | 3 refills | Status: DC

## 2019-01-30 ENCOUNTER — Telehealth: Payer: Self-pay | Admitting: Cardiology

## 2019-01-30 NOTE — Telephone Encounter (Signed)
Pt has been made aware that her bp is controlled and that if she continues to have headaches, to contact her pcp, that we didn't think her bp was contributing.

## 2019-01-30 NOTE — Telephone Encounter (Signed)
New Message         Patient was calling to speak with Cecille Rubin to discuss her b/p reading

## 2019-01-30 NOTE — Telephone Encounter (Signed)
OK great, BP is under control and not cause of her headaches.  If headaches continue she should see her PCP or call them.  Thanks.

## 2019-01-30 NOTE — Telephone Encounter (Signed)
Pt called with her bp readings: 8:00 this morning, before meds, 153/76 12:15 today: 138/53 2:00 today:  115/57

## 2019-01-31 ENCOUNTER — Other Ambulatory Visit: Payer: Self-pay | Admitting: Family Medicine

## 2019-01-31 DIAGNOSIS — J42 Unspecified chronic bronchitis: Secondary | ICD-10-CM

## 2019-02-01 ENCOUNTER — Other Ambulatory Visit: Payer: Self-pay | Admitting: Family Medicine

## 2019-02-09 ENCOUNTER — Other Ambulatory Visit: Payer: Self-pay

## 2019-02-09 ENCOUNTER — Ambulatory Visit (INDEPENDENT_AMBULATORY_CARE_PROVIDER_SITE_OTHER): Payer: Medicare Other | Admitting: *Deleted

## 2019-02-09 DIAGNOSIS — I495 Sick sinus syndrome: Secondary | ICD-10-CM

## 2019-02-09 DIAGNOSIS — I480 Paroxysmal atrial fibrillation: Secondary | ICD-10-CM

## 2019-02-10 LAB — CUP PACEART REMOTE DEVICE CHECK
Battery Remaining Longevity: 36 mo
Battery Remaining Percentage: 54 %
Brady Statistic RA Percent Paced: 98 %
Brady Statistic RV Percent Paced: 8 %
Date Time Interrogation Session: 20200511061000
Implantable Lead Implant Date: 19990610
Implantable Lead Implant Date: 20160713
Implantable Lead Location: 753859
Implantable Lead Location: 753860
Implantable Lead Model: 4136
Implantable Lead Serial Number: 29813363
Implantable Pulse Generator Implant Date: 20150130
Lead Channel Impedance Value: 307 Ohm
Lead Channel Impedance Value: 635 Ohm
Lead Channel Pacing Threshold Amplitude: 0.6 V
Lead Channel Pacing Threshold Amplitude: 1.1 V
Lead Channel Pacing Threshold Pulse Width: 0.4 ms
Lead Channel Pacing Threshold Pulse Width: 0.5 ms
Lead Channel Setting Pacing Amplitude: 1.1 V
Lead Channel Setting Pacing Amplitude: 2 V
Lead Channel Setting Pacing Pulse Width: 0.4 ms
Lead Channel Setting Sensing Sensitivity: 2 mV
Pulse Gen Serial Number: 393477

## 2019-02-16 NOTE — Progress Notes (Signed)
Remote pacemaker transmission.   

## 2019-03-09 ENCOUNTER — Other Ambulatory Visit: Payer: Self-pay | Admitting: Family Medicine

## 2019-03-09 DIAGNOSIS — E119 Type 2 diabetes mellitus without complications: Secondary | ICD-10-CM

## 2019-03-13 ENCOUNTER — Other Ambulatory Visit: Payer: Self-pay | Admitting: Interventional Cardiology

## 2019-03-13 MED ORDER — DRONEDARONE HCL 400 MG PO TABS: 400.0000 mg | ORAL_TABLET | Freq: Every day | ORAL | 3 refills | Status: DC

## 2019-03-28 ENCOUNTER — Other Ambulatory Visit: Payer: Self-pay | Admitting: Family Medicine

## 2019-03-28 DIAGNOSIS — J42 Unspecified chronic bronchitis: Secondary | ICD-10-CM

## 2019-05-12 ENCOUNTER — Ambulatory Visit (INDEPENDENT_AMBULATORY_CARE_PROVIDER_SITE_OTHER): Payer: Medicare Other | Admitting: *Deleted

## 2019-05-12 DIAGNOSIS — I480 Paroxysmal atrial fibrillation: Secondary | ICD-10-CM | POA: Diagnosis not present

## 2019-05-12 LAB — CUP PACEART REMOTE DEVICE CHECK
Battery Remaining Longevity: 36 mo
Battery Remaining Percentage: 54 %
Brady Statistic RA Percent Paced: 99 %
Brady Statistic RV Percent Paced: 8 %
Date Time Interrogation Session: 20200810061100
Implantable Lead Implant Date: 19990610
Implantable Lead Implant Date: 20160713
Implantable Lead Location: 753859
Implantable Lead Location: 753860
Implantable Lead Model: 4136
Implantable Lead Serial Number: 29813363
Implantable Pulse Generator Implant Date: 20150130
Lead Channel Impedance Value: 331 Ohm
Lead Channel Impedance Value: 611 Ohm
Lead Channel Pacing Threshold Amplitude: 0.7 V
Lead Channel Pacing Threshold Amplitude: 1.1 V
Lead Channel Pacing Threshold Pulse Width: 0.4 ms
Lead Channel Pacing Threshold Pulse Width: 0.5 ms
Lead Channel Setting Pacing Amplitude: 1.2 V
Lead Channel Setting Pacing Amplitude: 2 V
Lead Channel Setting Pacing Pulse Width: 0.4 ms
Lead Channel Setting Sensing Sensitivity: 2 mV
Pulse Gen Serial Number: 393477

## 2019-05-21 NOTE — Progress Notes (Signed)
Remote pacemaker transmission.   

## 2019-05-31 ENCOUNTER — Telehealth: Payer: Self-pay | Admitting: Family Medicine

## 2019-05-31 DIAGNOSIS — E119 Type 2 diabetes mellitus without complications: Secondary | ICD-10-CM

## 2019-05-31 DIAGNOSIS — E890 Postprocedural hypothyroidism: Secondary | ICD-10-CM

## 2019-06-04 ENCOUNTER — Telehealth: Payer: Self-pay | Admitting: Family Medicine

## 2019-06-04 NOTE — Telephone Encounter (Signed)
ERROR

## 2019-06-04 NOTE — Telephone Encounter (Signed)
Patient has made an appt for a Med refill on Weds 06/10/2019 but she will be out of her metFORMIN (GLUCOPHAGE) 500 MG tablet medication on Saturday 06/06/2019.  She would like medication to carry her until her appt with the provider and have [facility] sent to [facility] [address].

## 2019-06-09 ENCOUNTER — Telehealth: Payer: Medicare Other | Admitting: Family Medicine

## 2019-06-09 NOTE — Telephone Encounter (Signed)
Pts appt is tomorrow

## 2019-06-10 ENCOUNTER — Ambulatory Visit (INDEPENDENT_AMBULATORY_CARE_PROVIDER_SITE_OTHER): Payer: Medicare Other | Admitting: Family Medicine

## 2019-06-10 ENCOUNTER — Other Ambulatory Visit: Payer: Self-pay

## 2019-06-10 ENCOUNTER — Encounter: Payer: Self-pay | Admitting: Family Medicine

## 2019-06-10 VITALS — BP 108/50 | HR 73 | Temp 98.2°F | Resp 16 | Ht 62.0 in | Wt 125.8 lb

## 2019-06-10 DIAGNOSIS — E890 Postprocedural hypothyroidism: Secondary | ICD-10-CM | POA: Diagnosis not present

## 2019-06-10 DIAGNOSIS — J42 Unspecified chronic bronchitis: Secondary | ICD-10-CM

## 2019-06-10 DIAGNOSIS — E1169 Type 2 diabetes mellitus with other specified complication: Secondary | ICD-10-CM

## 2019-06-10 DIAGNOSIS — I119 Hypertensive heart disease without heart failure: Secondary | ICD-10-CM

## 2019-06-10 DIAGNOSIS — E785 Hyperlipidemia, unspecified: Secondary | ICD-10-CM

## 2019-06-10 LAB — LIPID PANEL
Cholesterol: 156 mg/dL (ref 0–200)
HDL: 64.3 mg/dL (ref >39.00)
LDL Cholesterol: 76 mg/dL (ref 0–99)
NonHDL: 92.13
Total CHOL/HDL Ratio: 2
Triglycerides: 79 mg/dL (ref 0.0–149.0)
VLDL: 15.8 mg/dL (ref 0.0–40.0)

## 2019-06-10 LAB — COMPREHENSIVE METABOLIC PANEL
ALT: 8 U/L (ref 0–35)
AST: 10 U/L (ref 0–37)
Albumin: 4 g/dL (ref 3.5–5.2)
Alkaline Phosphatase: 48 U/L (ref 39–117)
BUN: 13 mg/dL (ref 6–23)
CO2: 28 mEq/L (ref 19–32)
Calcium: 9.6 mg/dL (ref 8.4–10.5)
Chloride: 107 mEq/L (ref 96–112)
Creatinine, Ser: 0.85 mg/dL (ref 0.40–1.20)
GFR: 77.48 mL/min (ref >60.00)
Glucose, Bld: 99 mg/dL (ref 70–99)
Potassium: 4 mEq/L (ref 3.5–5.1)
Sodium: 141 mEq/L (ref 135–145)
Total Bilirubin: 0.4 mg/dL (ref 0.2–1.2)
Total Protein: 7.1 g/dL (ref 6.0–8.3)

## 2019-06-10 LAB — MICROALBUMIN / CREATININE URINE RATIO
Creatinine,U: 209.6 mg/dL
Microalb Creat Ratio: 2.9 mg/g (ref 0.0–30.0)
Microalb, Ur: 6 mg/dL — ABNORMAL HIGH (ref 0.0–1.9)

## 2019-06-10 LAB — TSH: TSH: 0.64 u[IU]/mL (ref 0.35–4.50)

## 2019-06-10 LAB — HEMOGLOBIN A1C: Hgb A1c MFr Bld: 6.2 % (ref 4.6–6.5)

## 2019-06-10 MED ORDER — FLUTICASONE-SALMETEROL 250-50 MCG/DOSE IN AEPB: 1.0000 | INHALATION_SPRAY | Freq: Two times a day (BID) | RESPIRATORY_TRACT | 3 refills | Status: DC

## 2019-06-10 NOTE — Patient Instructions (Addendum)
Health Maintenance Due  Topic Date Due  . OPHTHALMOLOGY EXAM  06/22/1945  . DEXA SCAN  06/21/2002  . INFLUENZA VACCINE  05/02/2019  . HEMOGLOBIN A1C  05/13/2019    Depression screen PHQ 2/9 10/09/2017  Decreased Interest 0  Down, Depressed, Hopeless 0  PHQ - 2 Score 0   A few things to remember from today's visit:   Hypertension with heart disease - Plan: Comprehensive metabolic panel  Type 2 diabetes mellitus with other specified complication, without long-term current use of insulin (HCC) - Plan: Hemoglobin A1c, Fructosamine, Microalbumin / creatinine urine ratio, Comprehensive metabolic panel  Postablative hypothyroidism  Hyperlipidemia associated with type 2 diabetes mellitus ([place]) - Plan: Lipid panel, Comprehensive metabolic panel  Chronic bronchitis, unspecified chronic bronchitis type ([place]) - Plan: Fluticasone-Salmeterol (ADVAIR DISKUS) 250-50 MCG/DOSE AEPB  You need to arrange your eye exam. Today we added another inhaler to use daily in the morning and night to prevent wheezing.  You can continue albuterol 2 puffs every 4-6 hours as needed. No changes in your blood pressure medications or diabetic medications. Your blood pressure was a little low, please check blood pressure at home. If your hemoglobin A1c is good, we might be able to stop 1 of the diabetes medications.  Please be sure medication list is accurate. If a new problem present, please set up appointment sooner than planned today.

## 2019-06-10 NOTE — Progress Notes (Signed)
HPI:   [name] is a 84 y.o. female, who is here today for chronic disease management. Last follow-up on 11/12/2018. Since her last visit she has followed with cardiologist, CAD and sick sinus syndrome.  DM II: Currently she is on Trajenta 5 mg daily and Metformin 500 mg pm. Glipizide discontinued. Denies abdominal pain,vomiting, polydipsia,polyuria, or polyphagia.  Lab Results  Component Value Date   HGBA1C 5.3 11/12/2018   Lab Results  Component Value Date   MICROALBUR 4.2 (H) 11/12/2018   HTN: She is on Cozaar 50 mg daily and Coreg 12.5 mg bid. Atrial fib and CAD s/p stent placement in 2005. She is on Multaq and Xarelto 15 mg daily.  BP mildly low today. She does not check BP regularly. Denies severe/frequent headache, visual changes, chest pain, worsening dyspnea, focal weakness, or edema.  Lab Results  Component Value Date   CREATININE 0.87 11/07/2018   BUN 15 11/07/2018   NA 141 11/07/2018   K 3.8 11/07/2018   CL 107 11/07/2018   CO2 25 11/07/2018   HLD: She is on Lipitor 10 mg daily.  Lab Results  Component Value Date   CHOL 132 03/10/2017   HDL 56 03/10/2017   LDLCALC 64 03/10/2017   TRIG 61 03/10/2017   CHOLHDL 2.4 03/10/2017   Hypothyroidism: Currently she is on levothyroxine 112 mcg daily. She has not noted abnormal weight loss, palpitation, cold/heat intolerance, or tremor.  Lab Results  Component Value Date   TSH 0.42 10/09/2017    COPD,she is using Albuterol inh as needed, > 2 times per week. Productive cough with clear sputum, worse in the morning. Negative for fever, chills, changes in appetite, or fatigue. Albuterol inhaler helps.   Review of Systems  Constitutional: Negative for activity change and chills.  HENT: Negative for mouth sores, nosebleeds and trouble swallowing.   Eyes: Negative for pain and redness.  Respiratory: Negative for cough, shortness of breath and wheezing.   Cardiovascular: Negative for chest  pain, palpitations and leg swelling.  Gastrointestinal: Positive for nausea (Occasionally with certain foods. ).       Episodes of diarrhea with fecal incontinence. Imodium helps.   Genitourinary: Negative for decreased urine volume, dysuria and hematuria.  Musculoskeletal: Positive for arthralgias. Negative for gait problem.  Skin: Negative for rash and wound.  Neurological: Negative for syncope and facial asymmetry.  Rest see pertinent positives and negatives per HPI.   Current Outpatient Medications on File Prior to Visit  Medication Sig Dispense Refill  . albuterol (VENTOLIN HFA) 108 (90 Base) MCG/ACT inhaler TAKE 2 PUFFS BY MOUTH EVERY 6 HOURS AS NEEDED FOR WHEEZE OR SHORTNESS OF BREATH*NOT COVERED* 8.5 g 1  . atorvastatin (LIPITOR) 10 MG tablet Take 1 tablet (10 mg total) by mouth daily. 90 tablet 3  . carvedilol (COREG) 12.5 MG tablet TAKE 1 TABLET (12.5 MG TOTAL) BY MOUTH 2 (TWO) TIMES DAILY WITH A MEAL. 180 tablet 2  . [facility] NICOTINE TRANSDERMAL SYS 14 MG/24HR patch PLACE 1 PATCH (14 MG TOTAL) ONTO THE SKIN DAILY. 28 patch 0  . diclofenac sodium (VOLTAREN) 1 % GEL Apply 4 g topically 4 (four) times daily. 4 Tube 3  . dronedarone (MULTAQ) 400 MG tablet Take 1 tablet (400 mg total) by mouth daily. 90 tablet 3  . esomeprazole (NEXIUM) 40 MG capsule TAKE 1 CAPSULE BY MOUTH EVERY DAY 90 capsule 1  . fenofibrate (TRICOR) 48 MG tablet TAKE 1 TABLET EVERY DAY 90 tablet 3  .  glucose blood test strip Use as instructed 100 each 12  . ipratropium (ATROVENT) 0.03 % nasal spray PLACE 2 SPRAYS INTO BOTH NOSTRILS EVERY 12 (TWELVE) HOURS. 30 mL 2  . levothyroxine (SYNTHROID, LEVOTHROID) 112 MCG tablet TAKE 1 TABLET (112 MCG TOTAL) BY MOUTH DAILY BEFORE BREAKFAST. 90 tablet 2  . losartan (COZAAR) 50 MG tablet Take 2 tablets ( 100 mg) by mouth daily 90 tablet 2  . metFORMIN (GLUCOPHAGE) 500 MG tablet TAKE 1 TABLET (500 MG TOTAL) BY MOUTH EVERY EVENING. 90 tablet 1  . nitroGLYCERIN (NITROSTAT) 0.4 MG  SL tablet Place 0.4 mg under the tongue every 5 (five) minutes x 3 doses as needed for chest pain (Max 3 doses within 15 min. Call 911).    . Rivaroxaban (XARELTO) 15 MG TABS tablet Take 1 tablet (15 mg total) by mouth daily with supper. 30 tablet 4  . TRADJENTA 5 MG TABS tablet TAKE 1 TABLET (5 MG TOTAL) DAILY BY MOUTH.**NEED APPOINTMENT FOR REFILLS** 90 tablet 0   No current facility-administered medications on file prior to visit.      Past Medical History:  Diagnosis Date  . CAD (coronary artery disease), native coronary artery    PTCA of distal right coronary artery 1997 Cypher stents to circumflex 2005 Cardiac cath in 2011 with patent stent to circumflex and moderate disease elsewhere treated medically   . Cardiac pacemaker in situ 12/24/2015   Original implant reportedly in 1991 for tachybradycardia syndrome, generator change in 1999, 2004 and evidently again in 2016 in [place]   . COPD (chronic obstructive pulmonary disease) ([place])   . Diabetes mellitus without complication ([place])   . Hypertension   . Hypothyroidism 03/23/2010  . Pacemaker   . Paroxysmal atrial fibrillation ([place]) 03/23/2010   CHA2DS2VASC score 5    No Known Allergies  Social History   Socioeconomic History  . Marital status: Widowed    Spouse name: Not on file  . Number of children: Not on file  . Years of education: Not on file  . Highest education level: Not on file  Occupational History  . Not on file  Social Needs  . Financial resource strain: Not on file  . Food insecurity    Worry: Not on file    Inability: Not on file  . Transportation needs    Medical: Not on file    Non-medical: Not on file  Tobacco Use  . Smoking status: Current Every Day Smoker    Packs/day: 0.30  . Smokeless tobacco: Never Used  Substance and Sexual Activity  . Alcohol use: No    Alcohol/week: 0.0 standard drinks  . Drug use: No  . Sexual activity: Not on file  Lifestyle  . Physical activity    Days per week: Not  on file    Minutes per session: Not on file  . Stress: Not on file  Relationships  . Social Herbalist on phone: Not on file    Gets together: Not on file    Attends religious service: Not on file    Active member of club or organization: Not on file    Attends meetings of clubs or organizations: Not on file    Relationship status: Not on file  Other Topics Concern  . Not on file  Social History Narrative  . Not on file    Vitals:   06/10/19 0854  BP: (!) 108/50  Pulse: 73  Resp: 16  Temp: 98.2 F (36.8 C)  SpO2: 98%   Body mass index is 23.01 kg/m.  Physical Exam  Nursing note and vitals reviewed. Constitutional: She is oriented to person, place, and time. She appears well-developed and well-nourished. No distress.  HENT:  Head: Normocephalic and atraumatic.  Mouth/Throat: Oropharynx is clear and moist and mucous membranes are normal. She has dentures.  Eyes: Conjunctivae are normal.  Cardiovascular: Normal rate and regular rhythm.  No murmur heard. DP pulses present.  Respiratory: Effort normal. No respiratory distress. She has wheezes (mild upon force expiration.).  GI: Soft. She exhibits no mass. There is no hepatomegaly. There is no abdominal tenderness.  Musculoskeletal:        General: No edema.  Lymphadenopathy:    She has no cervical adenopathy.  Neurological: She is alert and oriented to person, place, and time. She has normal strength. No cranial nerve deficit. Gait normal.  Skin: Skin is warm. No rash noted. No erythema.  Psychiatric: She has a normal mood and affect.  Well groomed, good eye contact.    ASSESSMENT AND PLAN:  Ms Hemminger was seen today for chronic disease management.  Diagnoses and all orders for this visit:  Lab Results  Component Value Date   HGBA1C 6.2 06/10/2019   Lab Results  Component Value Date   CREATININE 0.85 06/10/2019   BUN 13 06/10/2019   NA 141 06/10/2019   K 4.0 06/10/2019   CL 107 06/10/2019   CO2  28 06/10/2019   Lab Results  Component Value Date   MICROALBUR 6.0 (H) 06/10/2019   Lab Results  Component Value Date   ALT 8 06/10/2019   AST 10 06/10/2019   ALKPHOS 48 06/10/2019   BILITOT 0.4 06/10/2019    Type 2 diabetes mellitus with other specified complication, without long-term current use of insulin ([place]) HgA1C has been at goal. No changes in current management but will consider stopping Metformin if HgA1C is < 6.5. Regular exercise and healthy diet with avoidance of added sugar food intake is an important part of treatment and recommended. Annual eye exam (overdue) and foot care recommended. F/U in 5-6 months  -     Hemoglobin A1c -     Fructosamine -     Microalbumin / creatinine urine ratio -     Comprehensive metabolic panel  Hypertension with heart disease BP mildly low today. No changes in current management. Recommend monitoring BP's and if frequently 100/60 or less,meds need to be adjusted, Continue low salt diet.  -     Comprehensive metabolic panel  Postablative hypothyroidism No changes in current management, will follow labs done today and will give further recommendations accordingly.  -     TSH  Hyperlipidemia associated with type 2 diabetes mellitus (HCC) Continue tricor 48 mg and Atorvastatin 10 mg daily. Further recommendations will be given according to lab results.  -     Lipid panel -     Comprehensive metabolic panel  Chronic bronchitis, unspecified chronic bronchitis type (HCC) Mild wheezing today. Recommend Advair 250-50 mcg bid, swish mouth after use. Continue Albuterol inh q 4-6 hours as needed.  -     Fluticasone-Salmeterol (ADVAIR DISKUS) 250-50 MCG/DOSE AEPB; Inhale 1 puff into the lungs 2 (two) times daily.  In regard to fecal incontinence,recommend Metamucil daily,explained that it could be a sign of constipation. Adequate hydration. Imodium seems to help temporarily.  If persistent GI evaluation may be needed. Side  effects of Imodium discussed.   Return in about 4 months (around 10/10/2019) for  DM II,COPD,HTN.  -Ms. Revonda Menter was advised to return sooner than planned today if new concerns arise.        G. [name], MD  [facility]. [place] office.

## 2019-06-11 MED ORDER — LOSARTAN POTASSIUM 50 MG PO TABS: ORAL_TABLET | ORAL | 2 refills | Status: DC

## 2019-06-13 LAB — FRUCTOSAMINE: Fructosamine: 229 umol/L (ref 205–285)

## 2019-06-16 ENCOUNTER — Other Ambulatory Visit: Payer: Self-pay | Admitting: Family Medicine

## 2019-06-16 DIAGNOSIS — E119 Type 2 diabetes mellitus without complications: Secondary | ICD-10-CM

## 2019-06-16 MED ORDER — LINAGLIPTIN 5 MG PO TABS: ORAL_TABLET | ORAL | 0 refills | Status: DC

## 2019-06-18 ENCOUNTER — Other Ambulatory Visit: Payer: Self-pay | Admitting: Cardiology

## 2019-06-18 NOTE — Telephone Encounter (Signed)
Prescription refill request for Xarelto received.   Last office visit: [place] (08-26-2018) Weight: 57.1 kg  Age: 84 y.o. Scr: 0.85 (06-10-2019) CrCl: 47 ml/min  Prescription refill sent.

## 2019-06-30 ENCOUNTER — Other Ambulatory Visit: Payer: Self-pay | Admitting: Family Medicine

## 2019-06-30 DIAGNOSIS — E890 Postprocedural hypothyroidism: Secondary | ICD-10-CM

## 2019-06-30 NOTE — Telephone Encounter (Signed)
levothyroxine (SYNTHROID, LEVOTHROID) 112 MCG tablet  [id number] [address], [place] [phone] (Phone) [phone] (Fax)   Pt just had an appt with Dr Lenna Sciara on 06/10/2019, pls refill

## 2019-06-30 NOTE — Telephone Encounter (Signed)
Requested medication (s) are due for refill today: yes Requested medication (s) are on the active medication list: yes  Last refill:  09/01/2018  Future visit scheduled: no  Notes to clinic:  Hypothyroid agents failed  Requested Prescriptions  Pending Prescriptions Disp Refills   levothyroxine (SYNTHROID) 112 MCG tablet 90 tablet 2    Sig: Take 1 tablet (112 mcg total) by mouth daily before breakfast.     Endocrinology:  Hypothyroid Agents Failed - 06/30/2019  6:25 PM      Failed - TSH needs to be rechecked within 3 months after an abnormal result. Refill until TSH is due.      Passed - TSH in normal range and within 360 days    TSH  Date Value Ref Range Status  06/10/2019 0.64 0.35 - 4.50 uIU/mL Final         Passed - Valid encounter within last 12 months    Recent Outpatient Visits          2 weeks ago Type 2 diabetes mellitus with other specified complication, without long-term current use of insulin ([place])   [place] [name], Malka So, MD   7 months ago Neck pain   [name], Malka So, MD   9 months ago COPD exacerbation [facility])   [place] [facility], Langley Adie, MD   1 year ago Epistaxis, recurrent   [place] [name], Malka So, MD   1 year ago Diabetes mellitus without complication [facility])   [place] [name], Malka So, MD

## 2019-08-03 ENCOUNTER — Other Ambulatory Visit: Payer: Self-pay | Admitting: Family Medicine

## 2019-08-11 NOTE — Progress Notes (Signed)
Cardiology Office Note   Date:  08/12/2019   ID:  [name], DOB 1935/03/31, MRN [id number]  PCP:  [name], [name], MD  Cardiologist:  Dr. Tamala Julian    Chief Complaint  Patient presents with  . Coronary Artery Disease  . Hypertension      History of Present Illness: [name] is a 84 y.o. female who presents for CAD and HTN  Hhx of tachycardia-bradycardia syndrome, permanent pacemaker, coronary artery disease with prior right coronary drug-eluting stent as well as circumflex cypher stent 2005. History of COPD, continue smoking, diabetes, hypertension, and paroxysmal atrial fibrillation. Currently on anticoagulation and Multaqrelated to atrial fib.  Does not tolerate BID dosing of Multaq due to diarrhea at higher dose.  target BP is 140/90  + tobacco use.  Anticoagulation on Xarelto LDL followed by her PCP.   Seen in ER 11/20/18 for Rt neck pain troponin neg  hgb was 10.7 and Cr 0.87 CXR with stable cardiomegaly.  Chronic interstitial changes. And CT of neck with degenerative changes at C6-7.    Today she is having neck pain, turned her head and pain began, it has improved since earlier in the day.    She is taking her xarelto and multag without problems.  She is not using the nicotine patch, she still smokes and has not stopped due to stress.  Her glucose is stable.  Her BP is up some today but she is having neck pain.   She is staying with her daughter which is stressful.      Past Medical History:  Diagnosis Date  . CAD (coronary artery disease), native coronary artery    PTCA of distal right coronary artery 1997 Cypher stents to circumflex 2005 Cardiac cath in 2011 with patent stent to circumflex and moderate disease elsewhere treated medically   . Cardiac pacemaker in situ 12/24/2015   Original implant reportedly in 1991 for tachybradycardia syndrome, generator change in 1999, 2004 and evidently again in 2016 in [place]   . COPD (chronic obstructive pulmonary disease)  ([place])   . Diabetes mellitus without complication ([place])   . Hypertension   . Hypothyroidism 03/23/2010  . Pacemaker   . Paroxysmal atrial fibrillation ([place]) 03/23/2010   CHA2DS2VASC score 5     Past Surgical History:  Procedure Laterality Date  . CARDIAC CATHETERIZATION N/A 12/26/2015   Procedure: Left Heart Cath and Coronary Angiography;  Surgeon: Peter M [name], MD;  Location: [facility];  Service: Cardiovascular;  Laterality: N/A;  . CARDIOVERSION  05/2015  . CHOLECYSTECTOMY  2012  . CORONARY ANGIOPLASTY WITH STENT PLACEMENT  1990  . HERNIA REPAIR    . PACEMAKER GENERATOR CHANGE  [id number], 2016  . PACEMAKER INSERTION  1991     Current Outpatient Medications  Medication Sig Dispense Refill  . albuterol (VENTOLIN HFA) 108 (90 Base) MCG/ACT inhaler TAKE 2 PUFFS BY MOUTH EVERY 6 HOURS AS NEEDED FOR WHEEZE OR SHORTNESS OF BREATH*NOT COVERED* 8.5 g 1  . atorvastatin (LIPITOR) 10 MG tablet Take 1 tablet (10 mg total) by mouth daily. 90 tablet 3  . carvedilol (COREG) 12.5 MG tablet TAKE 1 TABLET (12.5 MG TOTAL) BY MOUTH 2 (TWO) TIMES DAILY WITH A MEAL. 180 tablet 2  . [facility] NICOTINE TRANSDERMAL SYS 14 MG/24HR patch PLACE 1 PATCH (14 MG TOTAL) ONTO THE SKIN DAILY. 28 patch 0  . diclofenac sodium (VOLTAREN) 1 % GEL Apply 4 g topically 4 (four) times daily. 4 Tube 3  . dronedarone (MULTAQ)  400 MG tablet Take 1 tablet (400 mg total) by mouth daily. 90 tablet 3  . esomeprazole (NEXIUM) 40 MG capsule TAKE 1 CAPSULE BY MOUTH EVERY DAY 90 capsule 1  . fenofibrate (TRICOR) 48 MG tablet TAKE 1 TABLET EVERY DAY 90 tablet 3  . Fluticasone-Salmeterol (ADVAIR DISKUS) 250-50 MCG/DOSE AEPB Inhale 1 puff into the lungs 2 (two) times daily. 1 each 3  . glucose blood test strip Use as instructed 100 each 12  . levothyroxine (SYNTHROID) 112 MCG tablet TAKE 1 TABLET (112 MCG TOTAL) BY MOUTH DAILY BEFORE BREAKFAST. 90 tablet 2  . linagliptin (TRADJENTA) 5 MG TABS tablet TAKE 1 TABLET (5 MG TOTAL) DAILY BY  MOUTH.**NEED APPOINTMENT FOR REFILLS** 90 tablet 0  . losartan (COZAAR) 50 MG tablet Take 2 tablets ( 100 mg) by mouth daily 90 tablet 2  . nitroGLYCERIN (NITROSTAT) 0.4 MG SL tablet Place 0.4 mg under the tongue every 5 (five) minutes x 3 doses as needed for chest pain (Max 3 doses within 15 min. Call 911).    Alveda Reasons 15 MG TABS tablet TAKE 1 TABLET BY MOUTH DAILY WITH SUPPER. 30 tablet 5   No current facility-administered medications for this visit.     Allergies:   Patient has no known allergies.    Social History:  The patient  reports that she has been smoking. She has been smoking about 0.30 packs per day. She has never used smokeless tobacco. She reports that she does not drink alcohol or use drugs.   Family History:  The patient's family history includes Cirrhosis in her brother; Heart attack in her mother; Heart disease in her father and mother.    ROS:  General:no colds or fevers, no weight changes Skin:no rashes or ulcers HEENT:no blurred vision, no congestion CV:see HPI PUL:see HPI GI:no diarrhea constipation or melena, no indigestion GU:no hematuria, no dysuria MS:no joint pain, no claudication, + neck pain Neuro:no syncope, no lightheadedness Endo:+ diabetes, no thyroid disease  Wt Readings from Last 3 Encounters:  08/12/19 134 lb (60.8 kg)  06/10/19 125 lb 12.8 oz (57.1 kg)  01/28/19 134 lb (60.8 kg)     PHYSICAL EXAM: VS:  BP (!) 146/76   Pulse 77   Ht 5\' 2"  (1.575 m)   Wt 134 lb (60.8 kg)   LMP  (LMP Unknown)   SpO2 98%   BMI 24.51 kg/m  , BMI Body mass index is 24.51 kg/m. General:Pleasant affect, NAD Skin:Warm and dry, brisk capillary refill HEENT:normocephalic, sclera clear, mucus membranes moist Neck:supple, no JVD, no bruits  Heart:S1S2 RRR without murmur, gallup, rub or click Lungs:clear without rales, rhonchi, or wheezes QMV:HQIO, non tender, + BS, do not palpate liver spleen or masses Ext:no lower ext edema, 2+ pedal pulses, 2+ radial  pulses Neuro:alert and oriented X 3, MAE, follows commands, + facial symmetry    EKG:  EKG is NOT ordered today.    Recent Labs: 11/07/2018: Hemoglobin 10.7; Platelets 181 06/10/2019: ALT 8; BUN 13; Creatinine, Ser 0.85; Potassium 4.0; Sodium 141; TSH 0.64    Lipid Panel    Component Value Date/Time   CHOL 156 06/10/2019 0929   TRIG 79.0 06/10/2019 0929   HDL 64.30 06/10/2019 0929   CHOLHDL 2 06/10/2019 0929   VLDL 15.8 06/10/2019 0929   LDLCALC 76 06/10/2019 0929       Other studies Reviewed: Additional studies/ records that were reviewed today include: .  Echo 03/10/17 Study Conclusions  - Left ventricle: The cavity size was  mildly dilated. Wall   thickness was increased in a pattern of mild LVH. The estimated   ejection fraction was 55%. Left ventricular diastolic function   parameters were normal. - Aortic valve: There was mild regurgitation. - Left atrium: The atrium was moderately dilated. - Atrial septum: No defect or patent foramen ovale was identified.  ASSESSMENT AND PLAN:  1.  CAD with prior stent and no angina continue coreg, no ASA due to Xarelto  2.  PAF on xarelto and on Multaq but only once per day due to diarrhea, EP aware. 3.  Tachy brady with PPM recently checked by remote with no problems - has been a year since she has seen Dr. Curt Bears will make appt in next few months 4.  HTN elevated today but with neck pain.   5.  Tobacco use discussed stopping, smoking outside the house.  6.  HLD per dr. [name] and stable. continue lipitor.    Current medicines are reviewed with the patient today.  The patient Has no concerns regarding medicines.  The following changes have been made:  See above Labs/ tests ordered today include:see above  Disposition:   FU:  see above  Signed, Cecilie Kicks, NP  08/12/2019 10:02 AM    [address] [place], [place], [place] [address] [place], [place] Phone:  [phone]; Fax: [phone]

## 2019-08-12 ENCOUNTER — Other Ambulatory Visit: Payer: Self-pay

## 2019-08-12 ENCOUNTER — Ambulatory Visit (INDEPENDENT_AMBULATORY_CARE_PROVIDER_SITE_OTHER): Payer: Medicare Other | Admitting: *Deleted

## 2019-08-12 ENCOUNTER — Ambulatory Visit (INDEPENDENT_AMBULATORY_CARE_PROVIDER_SITE_OTHER): Payer: Medicare Other | Admitting: Cardiology

## 2019-08-12 ENCOUNTER — Encounter: Payer: Self-pay | Admitting: Cardiology

## 2019-08-12 VITALS — BP 146/76 | HR 77 | Ht 62.0 in | Wt 134.0 lb

## 2019-08-12 DIAGNOSIS — I495 Sick sinus syndrome: Secondary | ICD-10-CM

## 2019-08-12 DIAGNOSIS — E785 Hyperlipidemia, unspecified: Secondary | ICD-10-CM | POA: Diagnosis not present

## 2019-08-12 DIAGNOSIS — I480 Paroxysmal atrial fibrillation: Secondary | ICD-10-CM

## 2019-08-12 DIAGNOSIS — I10 Essential (primary) hypertension: Secondary | ICD-10-CM | POA: Diagnosis not present

## 2019-08-12 DIAGNOSIS — I2510 Atherosclerotic heart disease of native coronary artery without angina pectoris: Secondary | ICD-10-CM

## 2019-08-12 DIAGNOSIS — Z950 Presence of cardiac pacemaker: Secondary | ICD-10-CM

## 2019-08-12 LAB — CUP PACEART REMOTE DEVICE CHECK
Battery Remaining Longevity: 30 mo
Battery Remaining Percentage: 51 %
Brady Statistic RA Percent Paced: 99 %
Brady Statistic RV Percent Paced: 11 %
Date Time Interrogation Session: 20201111043452
Implantable Lead Implant Date: 19990610
Implantable Lead Implant Date: 20160713
Implantable Lead Location: 753859
Implantable Lead Location: 753860
Implantable Lead Model: 4136
Implantable Lead Serial Number: 29813363
Implantable Pulse Generator Implant Date: 20150130
Lead Channel Impedance Value: 320 Ohm
Lead Channel Impedance Value: 575 Ohm
Lead Channel Pacing Threshold Amplitude: 0.7 V
Lead Channel Pacing Threshold Amplitude: 1.1 V
Lead Channel Pacing Threshold Pulse Width: 0.4 ms
Lead Channel Pacing Threshold Pulse Width: 0.5 ms
Lead Channel Setting Pacing Amplitude: 1.2 V
Lead Channel Setting Pacing Amplitude: 2 V
Lead Channel Setting Pacing Pulse Width: 0.4 ms
Lead Channel Setting Sensing Sensitivity: 2 mV
Pulse Gen Serial Number: 393477

## 2019-08-12 NOTE — Patient Instructions (Signed)
Medication Instructions:  Your physician recommends that you continue on your current medications as directed. Please refer to the Current Medication list given to you today.  *If you need a refill on your cardiac medications before your next appointment, please call your pharmacy*  Lab Work: None   If you have labs (blood work) drawn today and your tests are completely normal, you will receive your results only by: Marland Kitchen MyChart Message (if you have MyChart) OR . A paper copy in the mail If you have any lab test that is abnormal or we need to change your treatment, we will call you to review the results.  Testing/Procedures: None   Follow-Up: You are scheduled to see Dr. Curt Bears on 11/10/2019 @ 3:30 PM   Your physician wants you to follow-up in: 1 year with Dr. Gaspar Bidding will receive a reminder letter in the mail two months in advance. If you don't receive a letter, please call our office to schedule the follow-up appointment.   Other Instructions

## 2019-08-13 ENCOUNTER — Other Ambulatory Visit: Payer: Self-pay | Admitting: Family Medicine

## 2019-08-13 DIAGNOSIS — E119 Type 2 diabetes mellitus without complications: Secondary | ICD-10-CM

## 2019-08-22 ENCOUNTER — Other Ambulatory Visit: Payer: Self-pay | Admitting: Family Medicine

## 2019-09-02 NOTE — Progress Notes (Signed)
Remote pacemaker transmission.   

## 2019-09-08 ENCOUNTER — Telehealth: Payer: Self-pay | Admitting: Family Medicine

## 2019-09-08 MED ORDER — ONETOUCH VERIO VI STRP: ORAL_STRIP | 4 refills | Status: AC

## 2019-09-08 NOTE — Telephone Encounter (Signed)
Rx re-sent with all info needed.

## 2019-09-08 NOTE — Telephone Encounter (Signed)
Pt called stating the pharmacy needing clarification on pts test strips. Pt states she has not been able to test since last month due to having no test strips. Please advise.    [id number] [address], [place] [address]  Phone: [phone] Fax: [phone]  Not a 24 hour pharmacy; exact hours not known.

## 2019-09-08 NOTE — Telephone Encounter (Signed)
Please advise 

## 2019-09-15 ENCOUNTER — Other Ambulatory Visit: Payer: Self-pay | Admitting: Family Medicine

## 2019-09-15 DIAGNOSIS — J42 Unspecified chronic bronchitis: Secondary | ICD-10-CM

## 2019-09-16 ENCOUNTER — Telehealth: Payer: Self-pay | Admitting: Cardiology

## 2019-09-16 NOTE — Telephone Encounter (Signed)
Patient states she is having some gas pains, she did drink some warm sprite and that made her burp. But the pain is still there today.  She wants to know if she should take a nitroglycerin.  She states the pain is in between her shoulder blades, she is not having chest pain.

## 2019-09-16 NOTE — Telephone Encounter (Signed)
Last night, after dinner, pt had some pain between her shoulder blades that she felt was related to gas.  Drank warm Sprite, belched 3x and felt much better.  Occurred again this morning after eating breakfast but subsided again with soda.  Can still feel slit discomfort between shoulder blades.  Denies CP, SOB, HA, lightheadedness, dizziness, blurred vision or other issues.  Has no way of checking BP and HR.  Advised pt to reach out to PCP as this sounds GI related since there is relief with belching. Also advised to try taking Tylenol to see if pain eases. Pt verbalized understanding and was in agreement with plan.

## 2019-09-17 NOTE — Telephone Encounter (Signed)
I agree with that

## 2019-09-23 ENCOUNTER — Telehealth: Payer: Self-pay

## 2019-09-23 NOTE — Telephone Encounter (Signed)
Spoke to [place], pt daughter and she stated that pt was c/o abdominal pain. According to [facility] pt then tried to walk to her room and fainted. Pt then became Sweating and clammy with heavy breathing. [name] checked pt blood sugar and it was 204 pt normally in 80s-90s range.  [name] called EMS and they rechecked pt sugar and it was at 197, pulse was 70 and O2 was at 90. [name] has no way of checking pt O2 and denies that pt has a fever. [name] stated that the EMT advised that pt was dehydrated and it could be due to pt having severe diarrhea. EMS stated pt abdominal pain could have came from pt being a diabetic.  [name] stated that Pt has hx of a blockage in abdomen but no surgery was put in place due to pt age. [name] was told if pt abdominal issue got worse to call.   Message will be routed to [place] and [place] for deposition. [name] advised that if pt sx worsen or persist to go tot he local UC or ED.

## 2019-09-23 NOTE — Telephone Encounter (Signed)
I agree with recommendations given. She needs to seek immediate medical attention if she cannot pass gas, abdominal pain gets suddenly worse, if she develops fever, nausea, vomiting, or MS changes. Please arrange follow up appt. Thanks, BJ

## 2019-09-23 NOTE — Telephone Encounter (Signed)
Called pt emergency contact but stated that number was disconnected. PT number is the same as the emergency contact. CRM will be created in case they call back from another number.

## 2019-09-30 ENCOUNTER — Ambulatory Visit (INDEPENDENT_AMBULATORY_CARE_PROVIDER_SITE_OTHER): Payer: Medicare Other | Admitting: Family Medicine

## 2019-09-30 ENCOUNTER — Encounter: Payer: Self-pay | Admitting: Family Medicine

## 2019-09-30 ENCOUNTER — Other Ambulatory Visit: Payer: Self-pay

## 2019-09-30 VITALS — BP 128/70 | HR 81 | Temp 95.8°F | Resp 16 | Ht 62.0 in

## 2019-09-30 DIAGNOSIS — J42 Unspecified chronic bronchitis: Secondary | ICD-10-CM | POA: Diagnosis not present

## 2019-09-30 DIAGNOSIS — E785 Hyperlipidemia, unspecified: Secondary | ICD-10-CM

## 2019-09-30 DIAGNOSIS — E1169 Type 2 diabetes mellitus with other specified complication: Secondary | ICD-10-CM | POA: Diagnosis not present

## 2019-09-30 DIAGNOSIS — I25119 Atherosclerotic heart disease of native coronary artery with unspecified angina pectoris: Secondary | ICD-10-CM

## 2019-09-30 DIAGNOSIS — E119 Type 2 diabetes mellitus without complications: Secondary | ICD-10-CM | POA: Diagnosis not present

## 2019-09-30 DIAGNOSIS — R55 Syncope and collapse: Secondary | ICD-10-CM | POA: Diagnosis not present

## 2019-09-30 DIAGNOSIS — K5900 Constipation, unspecified: Secondary | ICD-10-CM

## 2019-09-30 DIAGNOSIS — R413 Other amnesia: Secondary | ICD-10-CM

## 2019-09-30 DIAGNOSIS — I495 Sick sinus syndrome: Secondary | ICD-10-CM

## 2019-09-30 LAB — CBC
HCT: 32.5 % — ABNORMAL LOW (ref 36.0–46.0)
Hemoglobin: 10.6 g/dL — ABNORMAL LOW (ref 12.0–15.0)
MCHC: 32.6 g/dL (ref 30.0–36.0)
MCV: 88.3 fl (ref 78.0–100.0)
Platelets: 222 10*3/uL (ref 150.0–400.0)
RBC: 3.69 Mil/uL — ABNORMAL LOW (ref 3.87–5.11)
RDW: 14.3 % (ref 11.5–15.5)
WBC: 6 10*3/uL (ref 4.0–10.5)

## 2019-09-30 LAB — BASIC METABOLIC PANEL
BUN: 13 mg/dL (ref 6–23)
CO2: 26 mEq/L (ref 19–32)
Calcium: 9.3 mg/dL (ref 8.4–10.5)
Chloride: 106 mEq/L (ref 96–112)
Creatinine, Ser: 0.78 mg/dL (ref 0.40–1.20)
GFR: 85.5 mL/min (ref >60.00)
Glucose, Bld: 106 mg/dL — ABNORMAL HIGH (ref 70–99)
Potassium: 3.9 mEq/L (ref 3.5–5.1)
Sodium: 140 mEq/L (ref 135–145)

## 2019-09-30 LAB — HEMOGLOBIN A1C: Hgb A1c MFr Bld: 6.5 % (ref 4.6–6.5)

## 2019-09-30 NOTE — Progress Notes (Signed)
ACUTE VISIT   HPI:  Chief Complaint  Patient presents with  . Dehydration    [name] is a 84 y.o. female, who is here today with her daughter to follow on recent episode of syncope,09/21/19. According to daughter, she did not look well,she gripped her, so she did not fall on the floor.  She states that "things got dark."  Family was calling her name, she seemed to be "rolling" her eyes.  Daughter did not noted seizure like movement. No urine or bowel incontinence and no post ictal symptoms.  Negative for severe/unusual headache,visual changes,CP,SOB,or palpitations before event. No prior Hx of syncope. EMS evaluated pt at home. BS 204 and 198. BP was high,not sure about readings.  According to daughter, she was told she needed IVF but they did not do it. Recommend oral hydration.  For the next 2-3 days she was "weak", fatigue.  DM II: She is not checking BS's. Denies polydipsia,polyuria, or polyphagia. She is on Tradjenta 5 mg daily.  Lab Results  Component Value Date   HGBA1C 6.2 06/10/2019   -HTN: She is on Carvedilol 12.5 mg bid and Losartan 50 mg daily.  + CAD, atrial fib, and sick sinus synd, s/p pacemaker placement. She is on Xorelto 15 mg daily and Multaq 400 mg daily.  HLD: She is on Atorvastatin 10 mg daily and fenofibrate 48 mg daily.  Lab Results  Component Value Date   CHOL 156 06/10/2019   HDL 64.30 06/10/2019   LDLCALC 76 06/10/2019   TRIG 79.0 06/10/2019   CHOLHDL 2 06/10/2019    Not checking BP at home. Negative for orthopnea,PND,or edema. She recently followed with her cardiologist.  Lab Results  Component Value Date   CREATININE 0.85 06/10/2019   BUN 13 06/10/2019   NA 141 06/10/2019   K 4.0 06/10/2019   CL 107 06/10/2019   CO2 28 06/10/2019   COPD: She is on Wixela inh 250-50 mcg  1 puff bid and Albuterol inh. She does not use Albuterol inh frequenctly. Morning cough at her baseline.  Still smoker and not  interested in smoking cessation.  -She has not had a bowel movement in a week or so. She is not drinking enough fluids,her daughter thinks.  She has not tried OTC treatments. Denies abdominal pain,nausea,or vomiting. No urinary symptoms.  Her daughter wants to address some concerns when [name] is not present and she agrees with leaving the room. Concerned about memory issues and irritability for a couple weeks. No falls or changes in gait stability. According to daughter, [name] does not want to feel sick or like she cannot take care of herself. Forgets things he says during arguments with daughter and gets upset if it brought up later. Also she keeps says that she wants to leave alone,she cannot afford to do so. His son is "homeless" and this causes stress also.  She is living with her daughter.   Review of Systems  Constitutional: Negative for activity change, appetite change, chills and fever.  HENT: Negative for mouth sores, nosebleeds, sore throat and trouble swallowing.   Respiratory: Positive for cough. Negative for wheezing.   Cardiovascular: Negative for leg swelling.  Gastrointestinal: Negative for abdominal distention and blood in stool.  Endocrine: Negative for cold intolerance and heat intolerance.  Genitourinary: Negative for decreased urine volume, dysuria and hematuria.  Musculoskeletal: Negative for myalgias.  Skin: Negative for rash and wound.  Neurological: Negative for seizures, weakness and numbness.  Psychiatric/Behavioral: Negative for confusion and hallucinations. The patient is nervous/anxious.   Rest see pertinent positives and negatives per HPI.   Current Outpatient Medications on File Prior to Visit  Medication Sig Dispense Refill  . albuterol (VENTOLIN HFA) 108 (90 Base) MCG/ACT inhaler TAKE 2 PUFFS BY MOUTH EVERY 6 HOURS AS NEEDED FOR WHEEZE OR SHORTNESS OF BREATH*NOT COVERED* 8.5 g 1  . atorvastatin (LIPITOR) 10 MG tablet Take 1 tablet (10 mg  total) by mouth daily. 90 tablet 3  . carvedilol (COREG) 12.5 MG tablet TAKE 1 TABLET (12.5 MG TOTAL) BY MOUTH 2 (TWO) TIMES DAILY WITH A MEAL. 180 tablet 2  . [employer] NICOTINE TRANSDERMAL SYS 14 MG/24HR patch PLACE 1 PATCH (14 MG TOTAL) ONTO THE SKIN DAILY. 28 patch 0  . diclofenac sodium (VOLTAREN) 1 % GEL Apply 4 g topically 4 (four) times daily. 4 Tube 3  . dronedarone (MULTAQ) 400 MG tablet Take 1 tablet (400 mg total) by mouth daily. 90 tablet 3  . esomeprazole (NEXIUM) 40 MG capsule TAKE 1 CAPSULE BY MOUTH EVERY DAY 90 capsule 1  . fenofibrate (TRICOR) 48 MG tablet TAKE 1 TABLET EVERY DAY 90 tablet 3  . glucose blood (ONETOUCH VERIO) test strip Use to test 3-4 times daily. 300 strip 4  . levothyroxine (SYNTHROID) 112 MCG tablet TAKE 1 TABLET (112 MCG TOTAL) BY MOUTH DAILY BEFORE BREAKFAST. 90 tablet 2  . losartan (COZAAR) 50 MG tablet TAKE 2 TABLETS ( 100 MG) BY MOUTH DAILY 180 tablet 1  . nitroGLYCERIN (NITROSTAT) 0.4 MG SL tablet Place 0.4 mg under the tongue every 5 (five) minutes x 3 doses as needed for chest pain (Max 3 doses within 15 min. Call 911).    . TRADJENTA 5 MG TABS tablet TAKE 1 TABLET (5 MG TOTAL) DAILY BY MOUTH.**NEED APPOINTMENT FOR REFILLS** 90 tablet 0  . WIXELA INHUB 250-50 MCG/DOSE AEPB TAKE 1 PUFF BY MOUTH TWICE A DAY 180 each 1  . XARELTO 15 MG TABS tablet TAKE 1 TABLET BY MOUTH DAILY WITH SUPPER. 30 tablet 5   No current facility-administered medications on file prior to visit.     Past Medical History:  Diagnosis Date  . CAD (coronary artery disease), native coronary artery    PTCA of distal right coronary artery 1997 Cypher stents to circumflex 2005 Cardiac cath in 2011 with patent stent to circumflex and moderate disease elsewhere treated medically   . Cardiac pacemaker in situ 12/24/2015   Original implant reportedly in 1991 for tachybradycardia syndrome, generator change in 1999, 2004 and evidently again in 2016 in [place]   . COPD (chronic obstructive  pulmonary disease) ([place])   . Diabetes mellitus without complication ([place])   . Hypertension   . Hypothyroidism 03/23/2010  . Pacemaker   . Paroxysmal atrial fibrillation ([place]) 03/23/2010   CHA2DS2VASC score 5    No Known Allergies  Social History   Socioeconomic History  . Marital status: Widowed    Spouse name: Not on file  . Number of children: Not on file  . Years of education: Not on file  . Highest education level: Not on file  Occupational History  . Not on file  Tobacco Use  . Smoking status: Current Every Day Smoker    Packs/day: 0.30  . Smokeless tobacco: Never Used  Substance and Sexual Activity  . Alcohol use: No    Alcohol/week: 0.0 standard drinks  . Drug use: No  . Sexual activity: Not on file  Other Topics Concern  .  Not on file  Social History Narrative  . Not on file   Social Determinants of Health   Financial Resource Strain:   . Difficulty of Paying Living Expenses: Not on file  Food Insecurity:   . Worried About Charity fundraiser in the Last Year: Not on file  . Ran Out of Food in the Last Year: Not on file  Transportation Needs:   . Lack of Transportation (Medical): Not on file  . Lack of Transportation (Non-Medical): Not on file  Physical Activity:   . Days of Exercise per Week: Not on file  . Minutes of Exercise per Session: Not on file  Stress:   . Feeling of Stress : Not on file  Social Connections:   . Frequency of Communication with Friends and Family: Not on file  . Frequency of Social Gatherings with Friends and Family: Not on file  . Attends Religious Services: Not on file  . Active Member of Clubs or Organizations: Not on file  . Attends Archivist Meetings: Not on file  . Marital Status: Not on file    Vitals:   09/30/19 0735  BP: 128/70  Pulse: 81  Resp: 16  Temp: (!) 95.8 F (35.4 C)  SpO2: 95%   Body mass index is 24.51 kg/m.   Physical Exam  Nursing note and vitals reviewed. Constitutional: She is  oriented to person, place, and time. She appears well-developed and well-nourished. No distress.  HENT:  Head: Normocephalic and atraumatic.  Mouth/Throat: Oropharynx is clear and moist and mucous membranes are normal.  Eyes: Pupils are equal, round, and reactive to light. Conjunctivae are normal.  Cardiovascular: Normal rate and regular rhythm.  No murmur heard. DP pulses present bilateral.  Respiratory: Effort normal and breath sounds normal. No respiratory distress.  GI: Soft. She exhibits no mass. There is no hepatomegaly. There is no abdominal tenderness.  Musculoskeletal:        General: No edema.  Lymphadenopathy:    She has no cervical adenopathy.  Neurological: She is alert and oriented to person, place, and time. She has normal strength. No cranial nerve deficit.  Otherwise stable gait with no assistance.  Skin: Skin is warm. No rash noted. No erythema.  Psychiatric: She has a normal mood and affect.  Well groomed, good eye contact.   ASSESSMENT AND PLAN:  Ms. Chennel was seen today for dehydration.  Diagnoses and all orders for this visit: Orders Placed This Encounter  Procedures  . Basic Metabolic Panel  . CBC  . Hemoglobin A1c  . EKG 12-Lead   Lab Results  Component Value Date   HGBA1C 6.5 09/30/2019   Lab Results  Component Value Date   CREATININE 0.78 09/30/2019   BUN 13 09/30/2019   NA 140 09/30/2019   K 3.9 09/30/2019   CL 106 09/30/2019   CO2 26 09/30/2019   Lab Results  Component Value Date   WBC 6.0 09/30/2019   HGB 10.6 (L) 09/30/2019   HCT 32.5 (L) 09/30/2019   MCV 88.3 09/30/2019   PLT 222.0 09/30/2019    Constipation, unspecified constipation type Miralax daily until a good bowel movement. Benefiber 1 tsp bid. Adequate hydration and fiber intake. Instructed about warning signs.  Type 2 diabetes mellitus without complication, without long-term current use of insulin (HCC) HgA1C at goal. No changes in current management. Regular  exercise and healthy diet with avoidance of added sugar food intake is an important part of treatment and recommended. Annual eye  exam, periodic dental and foot care recommended. F/U in 5-6 months  -     Hemoglobin A1c  Syncope, unspecified syncope type Possible etiologies discussed. Adequate hydration, EKG done today: Artefact, hard to read. It does not seem to be significant changes when compared with prior EKG.  Chronic bronchitis, unspecified chronic bronchitis type ([place]) Well controlled. No changes in current management. She is not interested in smoking cessation.  Hyperlipidemia associated with type 2 diabetes mellitus (HCC) LDL 78. Continue Atorvastatin 10 mg daily and Fenofibrate 48 mg daily.  Sick sinus syndrome (HCC) + Atrial fib. On Xarelto and Coreg. Follows with cardiologist.  Coronary artery disease involving native coronary artery of native heart with angina pectoris ([place]) On Coreg and Atorvastatin. Taking Marks with cardiologist.  Memory problem I discussed with daughter possible etiologies,including initial state of dementia. Today she is oriented x 3. Recommend cognitive challenging games. We will re-evaluate next visit.  7:38 am to 8:14 am visit and 8:15 Am -8:29 Am with daughter. > 50% was dedicated to discussion of Dx, prognosis, treatment options, and some side effects of medications.   Return in about 4 weeks (around 10/28/2019).     Zadin Lange G. [name], MD  [facility] [place] office.

## 2019-09-30 NOTE — Patient Instructions (Signed)
A few things to remember from today's visit:   Type 2 diabetes mellitus without complication, without long-term current use of insulin ([place]) - Plan: Hemoglobin A1c  Syncope, unspecified syncope type - Plan: Basic Metabolic Panel, CBC, EKG 01-UUVO   Syncope Syncope is when you pass out (faint) for a short time. It is caused by a sudden decrease in blood flow to the brain. Signs that you may be about to pass out include:  Feeling dizzy or light-headed.  Feeling sick to your stomach (nauseous).  Seeing all white or all black.  Having cold, clammy skin. If you pass out, get help right away. Call your local emergency services (911 in the [place] Do not drive yourself to the hospital. Follow these instructions at home: Watch for any changes in your symptoms. Take these actions to stay safe and help with your symptoms: Lifestyle  Do not drive, use machinery, or play sports until your doctor says it is okay.  Do not drink alcohol.  Do not use any products that contain nicotine or tobacco, such as cigarettes and e-cigarettes. If you need help quitting, ask your doctor.  Drink enough fluid to keep your pee (urine) pale yellow. General instructions  Take over-the-counter and prescription medicines only as told by your doctor.  If you are taking blood pressure or heart medicine, sit up and stand up slowly. Spend a few minutes getting ready to sit and then stand. This can help you feel less dizzy.  Have someone stay with you until you feel stable.  If you start to feel like you might pass out, lie down right away and raise (elevate) your feet above the level of your heart. Breathe deeply and steadily. Wait until all of the symptoms are gone.  Keep all follow-up visits as told by your doctor. This is important. Get help right away if:  You have a very bad headache.  You pass out once or more than once.  You have pain in your chest, belly, or back.  You have a very fast or uneven  heartbeat (palpitations).  It hurts to breathe.  You are bleeding from your mouth or your bottom (rectum).  You have black or tarry poop (stool).  You have jerky movements that you cannot control (seizure).  You are confused.  You have trouble walking.  You are very weak.  You have vision problems. These symptoms may be an emergency. Do not wait to see if the symptoms will go away. Get medical help right away. Call your local emergency services (911 in the [place] Do not drive yourself to the hospital. Summary  Syncope is when you pass out (faint) for a short time. It is caused by a sudden decrease in blood flow to the brain.  Signs that you may be about to faint include feeling dizzy, light-headed, or sick to your stomach, seeing all white or all black, or having cold, clammy skin.  If you start to feel like you might pass out, lie down right away and raise (elevate) your feet above the level of your heart. Breathe deeply and steadily. Wait until all of the symptoms are gone. This information is not intended to replace advice given to you by your health care provider. Make sure you discuss any questions you have with your health care provider. Document Released: 03/05/2008 Document Revised: 10/30/2017 Document Reviewed: 10/30/2017 Elsevier Patient Education  2020 [employer].  Please be sure medication list is accurate. If a new problem present, please set  up appointment sooner than planned today.

## 2019-10-02 ENCOUNTER — Encounter: Payer: Self-pay | Admitting: Family Medicine

## 2019-10-06 ENCOUNTER — Ambulatory Visit: Payer: Self-pay | Admitting: Family Medicine

## 2019-10-06 NOTE — Telephone Encounter (Signed)
fyi

## 2019-10-06 NOTE — Telephone Encounter (Signed)
Pt's daughter calling, states pt's grandson who lives with pt.,tested positive for covid this AM. States grandson's symptoms began 1 week ago. Pt had direct contact during that period. Yolanda Bonine has been self isolating since symptoms began. As of today, pt has been in own room, no direct contact. Pt has no symptoms. Advised testing due to prior contact last week, when grandson's symptoms presented. Information provided on testing process. Verbalizes understanding.   Reason for Disposition . Health Information question, no triage required and triager able to answer question  Answer Assessment - Initial Assessment Questions 1. REASON FOR CALL or QUESTION: "What is your reason for calling today?" or "How can I best help you?" or "What question do you have that I can help answer?"     Covid exposure questions  Protocols used: [place]

## 2019-10-12 ENCOUNTER — Other Ambulatory Visit: Payer: Self-pay | Admitting: Family Medicine

## 2019-10-16 ENCOUNTER — Ambulatory Visit: Payer: Medicare Other | Attending: Internal Medicine

## 2019-10-16 DIAGNOSIS — Z20822 Contact with and (suspected) exposure to covid-19: Secondary | ICD-10-CM

## 2019-10-17 LAB — NOVEL CORONAVIRUS, NAA: SARS-CoV-2, NAA: DETECTED — AB

## 2019-10-18 ENCOUNTER — Other Ambulatory Visit: Payer: Self-pay | Admitting: Adult Health

## 2019-10-18 DIAGNOSIS — U071 COVID-19: Secondary | ICD-10-CM

## 2019-10-18 NOTE — Progress Notes (Signed)
  I connected by phone with [name] on 10/18/2019 at 9:14 AM to discuss the potential use of an new treatment for mild to moderate COVID-19 viral infection in non-hospitalized patients.  This patient is a 85 y.o. female that meets the FDA criteria for Emergency Use Authorization of bamlanivimab or casirivimab\imdevimab.  Has a (+) direct SARS-CoV-2 viral test result  Has mild or moderate COVID-19   Is ? 85 years of age and weighs ? 40 kg  Is NOT hospitalized due to COVID-19  Is NOT requiring oxygen therapy or requiring an increase in baseline oxygen flow rate due to COVID-19  Is within 10 days of symptom onset  Has at least one of the high risk factor(s) for progression to severe COVID-19 and/or hospitalization as defined in EUA.  Specific high risk criteria : >/= 83 yo   I have spoken and communicated the following to the patient or parent/caregiver:  1. FDA has authorized the emergency use of bamlanivimab and casirivimab\imdevimab for the treatment of mild to moderate COVID-19 in adults and pediatric patients with positive results of direct SARS-CoV-2 viral testing who are 58 years of age and older weighing at least 40 kg, and who are at high risk for progressing to severe COVID-19 and/or hospitalization.  2. The significant known and potential risks and benefits of bamlanivimab and casirivimab\imdevimab, and the extent to which such potential risks and benefits are unknown.  3. Information on available alternative treatments and the risks and benefits of those alternatives, including clinical trials.  4. Patients treated with bamlanivimab and casirivimab\imdevimab should continue to self-isolate and use infection control measures (e.g., wear mask, isolate, social distance, avoid sharing personal items, clean and disinfect "high touch" surfaces, and frequent handwashing) according to CDC guidelines.   5. The patient or parent/caregiver has the option to accept or refuse  bamlanivimab or casirivimab\imdevimab .  After reviewing this information with the patient, The patient agreed to proceed with receiving the bamlanimivab infusion and will be provided a copy of the Fact sheet prior to receiving the infusion.[name] 10/18/2019 9:14 AM

## 2019-10-20 ENCOUNTER — Ambulatory Visit (HOSPITAL_COMMUNITY)
Admission: RE | Admit: 2019-10-20 | Discharge: 2019-10-20 | Disposition: A | Discharge status: Home / Self Care | Payer: Medicare Other | Source: Ambulatory Visit | Attending: Pulmonary Disease | Admitting: Pulmonary Disease

## 2019-10-20 DIAGNOSIS — U071 COVID-19: Secondary | ICD-10-CM | POA: Insufficient documentation

## 2019-10-20 DIAGNOSIS — Z23 Encounter for immunization: Secondary | ICD-10-CM | POA: Insufficient documentation

## 2019-10-20 MED ORDER — ALBUTEROL SULFATE HFA 108 (90 BASE) MCG/ACT IN AERS: 2.0000 | INHALATION_SPRAY | Freq: Once | RESPIRATORY_TRACT | Status: DC | PRN

## 2019-10-20 MED ORDER — EPINEPHRINE 0.3 MG/0.3ML IJ SOAJ: 0.3000 mg | Freq: Once | INTRAMUSCULAR | Status: DC | PRN

## 2019-10-20 MED ORDER — SODIUM CHLORIDE 0.9 % IV SOLN
INTRAVENOUS | Status: DC | PRN
  Administered 2019-10-20: 250 mL via INTRAVENOUS

## 2019-10-20 MED ORDER — METHYLPREDNISOLONE SODIUM SUCC 125 MG IJ SOLR: 125.0000 mg | Freq: Once | INTRAMUSCULAR | Status: DC | PRN

## 2019-10-20 MED ORDER — SODIUM CHLORIDE 0.9 % IV SOLN
700.0000 mg | Freq: Once | INTRAVENOUS | Status: AC
  Administered 2019-10-20: 700 mg via INTRAVENOUS
  Filled 2019-10-20: qty 20

## 2019-10-20 MED ORDER — FAMOTIDINE IN NACL 20-0.9 MG/50ML-% IV SOLN: 20.0000 mg | Freq: Once | INTRAVENOUS | Status: DC | PRN

## 2019-10-20 MED ORDER — DIPHENHYDRAMINE HCL 50 MG/ML IJ SOLN: 50.0000 mg | Freq: Once | INTRAMUSCULAR | Status: DC | PRN

## 2019-10-20 NOTE — Discharge Instructions (Signed)

## 2019-10-20 NOTE — Progress Notes (Signed)
  Diagnosis: COVID-19  Physician: Dr. Joya Gaskins  Procedure: [facility] Infusion [facility] Med: bamlanivimab infusion - Provided patient with bamlanimivab fact sheet for patients, parents and caregivers prior to infusion.  Complications: No immediate complications noted.  Discharge: Discharged home   Tia Masker 10/20/2019

## 2019-11-02 ENCOUNTER — Telehealth (INDEPENDENT_AMBULATORY_CARE_PROVIDER_SITE_OTHER): Payer: Medicare Other | Admitting: Family Medicine

## 2019-11-02 ENCOUNTER — Other Ambulatory Visit: Payer: Self-pay

## 2019-11-02 ENCOUNTER — Encounter: Payer: Self-pay | Admitting: Family Medicine

## 2019-11-02 VITALS — Ht 62.0 in

## 2019-11-02 DIAGNOSIS — R1084 Generalized abdominal pain: Secondary | ICD-10-CM | POA: Diagnosis not present

## 2019-11-02 DIAGNOSIS — U071 COVID-19: Secondary | ICD-10-CM

## 2019-11-02 DIAGNOSIS — I119 Hypertensive heart disease without heart failure: Secondary | ICD-10-CM

## 2019-11-02 DIAGNOSIS — J42 Unspecified chronic bronchitis: Secondary | ICD-10-CM

## 2019-11-02 DIAGNOSIS — E1169 Type 2 diabetes mellitus with other specified complication: Secondary | ICD-10-CM

## 2019-11-02 MED ORDER — DICYCLOMINE HCL 10 MG PO CAPS: 10.0000 mg | ORAL_CAPSULE | Freq: Three times a day (TID) | ORAL | 0 refills | Status: DC

## 2019-11-02 NOTE — Progress Notes (Signed)
Virtual Visit via Telephone Note  I connected with [name] on 11/02/19 at  2:00 PM EST by telephone and verified that I am speaking with the correct person using two identifiers.   I discussed the limitations, risks, security and privacy concerns of performing an evaluation and management service by telephone and the availability of in person appointments. I also discussed with the patient that there may be a patient responsible charge related to this service. The patient expressed understanding and agreed to proceed.  Location patient: home Location provider: work or home office Participants present for the call: patient, provider Patient did not have a visit in the prior 7 days to address this/these issue(s).   History of Present Illness: [name] is a 85 yo female with Hx of atrial fibrillation, DM 2, and COPD following on last visit. Since I saw her last she was diagnosed with COVID-19 pneumonia, treated with Bamlanivimab infusion. She is feeling close to her baseline, except for fatigue.  She gets tired walking around the house, has to sit to rest a few times during the day, not sure if it is SOB but slowly getting better. Cough has also greatly improved. + Smoker. COPD on Wixela 250-50 mcg 1 puff bid and Albuterol inh prn (1-2 times per week).  Negative for fever, chills, sore throat, CP, wheezing, or skin rash. She has a long history of abdominal pain and diarrhea, symptoms were worse with COVID-19 infection but back to her baseline.  Still "bad" generalized abdominal pain, cramps-like, intermittent. Exacerbated by food intake, any type, milder with fluid intake. Alleviated by defecation. Imodium also helps if taken before meals.  According to patient, she was evaluated by GI and work-up was otherwise negative.  One stool per day. Negative for blood or melena.  Abdominal CT in 11/2016:No acute finding. No cause of the presenting symptoms is identified. No sign of bowel  pathology. Aortic atherosclerosis without aneurysm. Though this examination was not done in the form of a CT angiogram, I do not suspect mesenteric vascular insufficiency.  DM2: BS 80-100's. Negative for polydipsia,polyuria, or polyphagia. She is on Trajenda 5 mg daily.  HTN: She has not checked BP, does not have a BP monitor. She is on Carvedilol 12.5 mg bid and Losartan 50 mg daily. Negative for unusual/frequent headaches,visual changes,orthopnea,PND,or edema.  Lab Results  Component Value Date   CREATININE 0.78 09/30/2019   BUN 13 09/30/2019   NA 140 09/30/2019   K 3.9 09/30/2019   CL 106 09/30/2019   CO2 26 09/30/2019    Observations/Objective: Patient sounds cheerful and well on the phone. I do not appreciate any SOB. Speech and thought processing are grossly intact. Patient reported vitals:Ht 5\' 2"  (1.575 m)   LMP  (LMP Unknown)   BMI 24.51 kg/m    Assessment and Plan:  1. Generalized abdominal pain +Diarrhea, which has been intermittent for years. She has also had occasional constipation. ? IBS. Reporting GI evaluation and negative work up. Recommend trying Bentyl 10 mg tid before meals. Some side effects discussed. Adequate hydration. Instructed about warning signs.  2. COVID-19 virus infection Recovered with no complications.  3. Type 2 diabetes mellitus with other specified complication, without long-term current use of insulin (HCC) Based on reported BS's, problem seems to be well controlled. No changes in current management.  4. Hypertension with heart disease No changes in current management. Continue low salt diet. Not checking BP's.  5. Chronic bronchitis, unspecified chronic bronchitis type (HCC) Stable. No changes  in current management. Not interested in smoking cessation.   Follow Up Instructions:  Return in about 6 weeks (around 12/14/2019) for anbdominal pain/diarrhea.  I did not refer this patient for an OV in the next 24 hours for  this/these issue(s).  I discussed the assessment and treatment plan with the patient. Robin Oliva was provided an opportunity to ask questions and all were answered. She agreed with the plan and demonstrated an understanding of the instructions.    I provided 14 minutes of non-face-to-face time during this encounter.   Laresha Bacorn [name], MD

## 2019-11-10 ENCOUNTER — Ambulatory Visit (INDEPENDENT_AMBULATORY_CARE_PROVIDER_SITE_OTHER): Payer: Medicare Other | Admitting: Cardiology

## 2019-11-10 ENCOUNTER — Encounter: Payer: Self-pay | Admitting: Cardiology

## 2019-11-10 ENCOUNTER — Other Ambulatory Visit: Payer: Self-pay

## 2019-11-10 VITALS — BP 150/52 | HR 69 | Ht 62.0 in | Wt 124.6 lb

## 2019-11-10 DIAGNOSIS — I495 Sick sinus syndrome: Secondary | ICD-10-CM

## 2019-11-10 NOTE — Progress Notes (Signed)
Electrophysiology Office Note   Date:  11/10/2019   ID:  Cailah Reach, DOB 17-Dec-1934, MRN [id number]  PCP:  [name], [name], MD  Cardiologist:  Tamala Julian Primary Electrophysiologist:  Constance Haw, MD    CC: Follow up for atrial fibrillation, tachybradycardia syndrome, and pacemaker.   History of Present Illness: [name] is a 85 y.o. female who presents today for electrophysiology follow up.   Hx CAD, previous coronary stenting including circumflex and right coronary arteries, hypertension, diabetes, COPD, permanent [employer] pacemaker for tachybradycardia syndrome, and chronic anticoagulation therapy.   Today, denies symptoms of palpitations, chest pain, shortness of breath, orthopnea, PND, lower extremity edema, claudication, dizziness, presyncope, syncope, bleeding, or neurologic sequela. The patient is tolerating medications without difficulties. Overall she is doing well. She has no chest pain or shortness of breath. She is able do all of her daily activities without restriction. Unfortunately she was diagnosed with Covid in the middle of January. She was minimally ill with symptoms above the neck. She is now recovered.  Past Medical History:  Diagnosis Date  . CAD (coronary artery disease), native coronary artery    PTCA of distal right coronary artery 1997 Cypher stents to circumflex 2005 Cardiac cath in 2011 with patent stent to circumflex and moderate disease elsewhere treated medically   . Cardiac pacemaker in situ 12/24/2015   Original implant reportedly in 1991 for tachybradycardia syndrome, generator change in 1999, 2004 and evidently again in 2016 in [place]   . COPD (chronic obstructive pulmonary disease) ([place])   . Diabetes mellitus without complication ([place])   . Hypertension   . Hypothyroidism 03/23/2010  . Pacemaker   . Paroxysmal atrial fibrillation ([place]) 03/23/2010   CHA2DS2VASC score 5    Past Surgical History:  Procedure Laterality Date  .  CARDIAC CATHETERIZATION N/A 12/26/2015   Procedure: Left Heart Cath and Coronary Angiography;  Surgeon: Peter M [name], MD;  Location: [facility];  Service: Cardiovascular;  Laterality: N/A;  . CARDIOVERSION  05/2015  . CHOLECYSTECTOMY  2012  . CORONARY ANGIOPLASTY WITH STENT PLACEMENT  1990  . HERNIA REPAIR    . PACEMAKER GENERATOR CHANGE  [id number], 2016  . PACEMAKER INSERTION  1991     Current Outpatient Medications  Medication Sig Dispense Refill  . albuterol (VENTOLIN HFA) 108 (90 Base) MCG/ACT inhaler TAKE 2 PUFFS BY MOUTH EVERY 6 HOURS AS NEEDED FOR WHEEZE OR SHORTNESS OF BREATH*NOT COVERED* 8.5 g 1  . atorvastatin (LIPITOR) 10 MG tablet Take 1 tablet (10 mg total) by mouth daily. 90 tablet 3  . carvedilol (COREG) 12.5 MG tablet TAKE 1 TABLET (12.5 MG TOTAL) BY MOUTH 2 (TWO) TIMES DAILY WITH A MEAL. 180 tablet 2  . [facility] NICOTINE TRANSDERMAL SYS 14 MG/24HR patch PLACE 1 PATCH (14 MG TOTAL) ONTO THE SKIN DAILY. 28 patch 0  . diclofenac sodium (VOLTAREN) 1 % GEL Apply 4 g topically 4 (four) times daily. 4 Tube 3  . dicyclomine (BENTYL) 10 MG capsule Take 1 capsule (10 mg total) by mouth 4 (four) times daily -  before meals and at bedtime. 90 capsule 0  . dronedarone (MULTAQ) 400 MG tablet Take 1 tablet (400 mg total) by mouth daily. 90 tablet 3  . esomeprazole (NEXIUM) 40 MG capsule TAKE 1 CAPSULE BY MOUTH EVERY DAY 90 capsule 1  . fenofibrate (TRICOR) 48 MG tablet TAKE 1 TABLET EVERY DAY 90 tablet 3  . glucose blood (ONETOUCH VERIO) test strip Use to test 3-4 times  daily. 300 strip 4  . levothyroxine (SYNTHROID) 112 MCG tablet TAKE 1 TABLET (112 MCG TOTAL) BY MOUTH DAILY BEFORE BREAKFAST. 90 tablet 2  . losartan (COZAAR) 50 MG tablet TAKE 2 TABLETS ( 100 MG) BY MOUTH DAILY 180 tablet 1  . nitroGLYCERIN (NITROSTAT) 0.4 MG SL tablet Place 0.4 mg under the tongue every 5 (five) minutes x 3 doses as needed for chest pain (Max 3 doses within 15 min. Call 911).    . TRADJENTA 5 MG TABS  tablet TAKE 1 TABLET (5 MG TOTAL) DAILY BY MOUTH.**NEED APPOINTMENT FOR REFILLS** 90 tablet 0  . XARELTO 15 MG TABS tablet TAKE 1 TABLET BY MOUTH DAILY WITH SUPPER. 30 tablet 5   No current facility-administered medications for this visit.    Allergies:   Patient has no known allergies.   Social History:  The patient  reports that she has been smoking. She has been smoking about 0.30 packs per day. She has never used smokeless tobacco. She reports that she does not drink alcohol or use drugs.   Family History:  The patient's family history includes Cirrhosis in her brother; Heart attack in her mother; Heart disease in her father and mother.   ROS:  Please see the history of present illness.   Otherwise, review of systems is positive for none.   All other systems are reviewed and negative.   PHYSICAL EXAM: VS:  BP (!) 150/52   Pulse 69   Ht 5\' 2"  (1.575 m)   Wt 124 lb 9.6 oz (56.5 kg)   LMP  (LMP Unknown)   SpO2 98%   BMI 22.79 kg/m  , BMI Body mass index is 22.79 kg/m. GEN: Well nourished, well developed, in no acute distress  HEENT: normal  Neck: no JVD, carotid bruits, or masses Cardiac: RRR; no murmurs, rubs, or gallops,no edema  Respiratory:  clear to auscultation bilaterally, normal work of breathing GI: soft, nontender, nondistended, + BS MS: no deformity or atrophy  Skin: warm and dry, device site well healed Neuro:  Strength and sensation are intact Psych: euthymic mood, full affect  EKG:  EKG is not ordered today. Personal review of the ekg ordered 09/30/19 shows sinus rhythm, rate 71  Personal review of the device interrogation today. Results in [facility]: 06/10/2019: ALT 8; TSH 0.64 09/30/2019: BUN 13; Creatinine, Ser 0.78; Hemoglobin 10.6; Platelets 222.0; Potassium 3.9; Sodium 140    Lipid Panel     Component Value Date/Time   CHOL 156 06/10/2019 0929   TRIG 79.0 06/10/2019 0929   HDL 64.30 06/10/2019 0929   CHOLHDL 2 06/10/2019 0929   VLDL  15.8 06/10/2019 0929   LDLCALC 76 06/10/2019 0929     Wt Readings from Last 3 Encounters:  11/10/19 124 lb 9.6 oz (56.5 kg)  08/12/19 134 lb (60.8 kg)  06/10/19 125 lb 12.8 oz (57.1 kg)      Other studies Reviewed: Additional studies/ records that were reviewed today include:  TTE 12/25/15 - Left ventricle: The cavity size was normal. Systolic function was   normal. The estimated ejection fraction was in the range of 55%   to 60%. Wall motion was normal; there were no regional wall   motion abnormalities. The study is not technically sufficient to   allow evaluation of LV diastolic function. - Aortic valve: Transvalvular velocity was within the normal range.   There was no stenosis. There was mild to moderate regurgitation. - Mitral valve: There was no regurgitation. -  Left atrium: The atrium was severely dilated. - Right ventricle: The cavity size was normal. Wall thickness was   normal. Systolic function was normal. - Right atrium: The atrium was severely dilated. - Tricuspid valve: There was mild regurgitation. - Pulmonary arteries: Systolic pressure was within the normal   range. PA peak pressure: 23 mm Hg (S).   Cardiac cath 12/26/15   Prox RCA to Dist RCA lesion, 30% stenosed.  Prox LAD lesion, 40% stenosed.  Mid LAD to Dist LAD lesion, 45% stenosed.  Ost Cx to Prox Cx lesion, 10% stenosed. The lesion was previously treated with a drug-eluting stent greater than two years ago. The left ventricular systolic function is normal.   ASSESSMENT AND PLAN:  1.  Tachy-Brady syndrome: Status post Catering manager. Device functioning appropriately. No changes at this time.   2. Paroxysmal atrial fibrillation: Currently on Xarelto and Multaq. CHA2DS2-VASc of 5. Remains in sinus rhythm.  3. Hypertension: Blood pressure is elevated in clinic today. On her last check in December it was normal, and her blood pressure check last week at her primary  physician's office was normal. I Karmelo Bass make no changes.  4. Coronary artery disease: Stable. No current angina.  Current medicines are reviewed at length with the patient today.   The patient does not have concerns regarding her medicines.  The following changes were made today: None  Labs/ tests ordered today include:  No orders of the defined types were placed in this encounter.    Disposition:   FU 12 months.   Signed, Miqueas Whilden Meredith Leeds, MD  11/10/2019 3:43 PM     [facility] [address] [place] [address] [phone] (office) [phone] (fax)

## 2019-11-11 ENCOUNTER — Ambulatory Visit (INDEPENDENT_AMBULATORY_CARE_PROVIDER_SITE_OTHER): Payer: Medicare Other | Admitting: *Deleted

## 2019-11-11 DIAGNOSIS — I480 Paroxysmal atrial fibrillation: Secondary | ICD-10-CM

## 2019-11-11 LAB — CUP PACEART REMOTE DEVICE CHECK
Battery Remaining Longevity: 30 mo
Battery Remaining Percentage: 50 %
Brady Statistic RA Percent Paced: 95 %
Brady Statistic RV Percent Paced: 20 %
Date Time Interrogation Session: 20210210021100
Implantable Lead Implant Date: 19990610
Implantable Lead Implant Date: 20160713
Implantable Lead Location: 753859
Implantable Lead Location: 753860
Implantable Lead Model: 4136
Implantable Lead Serial Number: 29813363
Implantable Pulse Generator Implant Date: 20150130
Lead Channel Impedance Value: 342 Ohm
Lead Channel Impedance Value: 588 Ohm
Lead Channel Pacing Threshold Amplitude: 0.7 V
Lead Channel Pacing Threshold Amplitude: 1.1 V
Lead Channel Pacing Threshold Pulse Width: 0.4 ms
Lead Channel Pacing Threshold Pulse Width: 0.5 ms
Lead Channel Setting Pacing Amplitude: 1.2 V
Lead Channel Setting Pacing Amplitude: 2 V
Lead Channel Setting Pacing Pulse Width: 0.4 ms
Lead Channel Setting Sensing Sensitivity: 2 mV
Pulse Gen Serial Number: 393477

## 2019-11-11 NOTE — Progress Notes (Signed)
PPM Remote  

## 2019-11-13 ENCOUNTER — Other Ambulatory Visit: Payer: Self-pay | Admitting: Family Medicine

## 2019-11-22 ENCOUNTER — Other Ambulatory Visit: Payer: Self-pay | Admitting: Family Medicine

## 2019-11-23 LAB — CUP PACEART INCLINIC DEVICE CHECK
Date Time Interrogation Session: 20210209102219
Implantable Lead Implant Date: 19990610
Implantable Lead Implant Date: 20160713
Implantable Lead Location: 753859
Implantable Lead Location: 753860
Implantable Lead Model: 4136
Implantable Lead Serial Number: 29813363
Implantable Pulse Generator Implant Date: 20150130
Lead Channel Pacing Threshold Amplitude: 0.8 V
Lead Channel Pacing Threshold Amplitude: 1.1 V
Lead Channel Pacing Threshold Pulse Width: 0.4 ms
Lead Channel Pacing Threshold Pulse Width: 0.5 ms
Lead Channel Sensing Intrinsic Amplitude: 9.6 mV
Pulse Gen Serial Number: 393477

## 2019-12-08 ENCOUNTER — Telehealth: Payer: Self-pay | Admitting: Family Medicine

## 2019-12-08 NOTE — Telephone Encounter (Signed)
Pt daughter [name] would like to let Dr.Jordan knom prior to pt appt that her mother is staring into space, not remembering things, becoming meaner, going back to baby stages and she wanted Dr. [name] to be aware because pt becomes upset when mentioned in front of her.

## 2019-12-08 NOTE — Telephone Encounter (Signed)
Fyi, patient has appt 3/16.

## 2019-12-14 ENCOUNTER — Other Ambulatory Visit: Payer: Self-pay

## 2019-12-15 ENCOUNTER — Ambulatory Visit (INDEPENDENT_AMBULATORY_CARE_PROVIDER_SITE_OTHER): Payer: Medicare Other | Admitting: Family Medicine

## 2019-12-15 ENCOUNTER — Encounter: Payer: Self-pay | Admitting: Family Medicine

## 2019-12-15 VITALS — BP 142/60 | HR 97 | Resp 16 | Ht 62.0 in | Wt 118.0 lb

## 2019-12-15 DIAGNOSIS — R1310 Dysphagia, unspecified: Secondary | ICD-10-CM | POA: Diagnosis not present

## 2019-12-15 DIAGNOSIS — K219 Gastro-esophageal reflux disease without esophagitis: Secondary | ICD-10-CM

## 2019-12-15 DIAGNOSIS — R634 Abnormal weight loss: Secondary | ICD-10-CM

## 2019-12-15 DIAGNOSIS — E876 Hypokalemia: Secondary | ICD-10-CM | POA: Diagnosis not present

## 2019-12-15 DIAGNOSIS — K529 Noninfective gastroenteritis and colitis, unspecified: Secondary | ICD-10-CM | POA: Diagnosis not present

## 2019-12-15 LAB — BASIC METABOLIC PANEL
BUN: 10 mg/dL (ref 6–23)
CO2: 30 mEq/L (ref 19–32)
Calcium: 8.8 mg/dL (ref 8.4–10.5)
Chloride: 103 mEq/L (ref 96–112)
Creatinine, Ser: 0.59 mg/dL (ref 0.40–1.20)
GFR: 117.94 mL/min (ref >60.00)
Glucose, Bld: 103 mg/dL — ABNORMAL HIGH (ref 70–99)
Potassium: 3 mEq/L — ABNORMAL LOW (ref 3.5–5.1)
Sodium: 141 mEq/L (ref 135–145)

## 2019-12-15 LAB — CBC WITH DIFFERENTIAL/PLATELET
Basophils Absolute: 0 10*3/uL (ref 0.0–0.1)
Basophils Relative: 0.8 % (ref 0.0–3.0)
Eosinophils Absolute: 0.1 10*3/uL (ref 0.0–0.7)
Eosinophils Relative: 1.2 % (ref 0.0–5.0)
HCT: 35.3 % — ABNORMAL LOW (ref 36.0–46.0)
Hemoglobin: 11.6 g/dL — ABNORMAL LOW (ref 12.0–15.0)
Lymphocytes Relative: 27.9 % (ref 12.0–46.0)
Lymphs Abs: 1.4 10*3/uL (ref 0.7–4.0)
MCHC: 33 g/dL (ref 30.0–36.0)
MCV: 86.7 fl (ref 78.0–100.0)
Monocytes Absolute: 0.5 10*3/uL (ref 0.1–1.0)
Monocytes Relative: 10.2 % (ref 3.0–12.0)
Neutro Abs: 3 10*3/uL (ref 1.4–7.7)
Neutrophils Relative %: 59.9 % (ref 43.0–77.0)
Platelets: 214 10*3/uL (ref 150.0–400.0)
RBC: 4.07 Mil/uL (ref 3.87–5.11)
RDW: 15.7 % — ABNORMAL HIGH (ref 11.5–15.5)
WBC: 5.1 10*3/uL (ref 4.0–10.5)

## 2019-12-15 LAB — C-REACTIVE PROTEIN: CRP: <1 mg/dL (ref 0.5–20.0)

## 2019-12-15 MED ORDER — POTASSIUM CHLORIDE CRYS ER 20 MEQ PO TBCR: EXTENDED_RELEASE_TABLET | ORAL | 3 refills | Status: DC

## 2019-12-15 MED ORDER — PANTOPRAZOLE SODIUM 40 MG PO TBEC: 40.0000 mg | DELAYED_RELEASE_TABLET | Freq: Every day | ORAL | 3 refills | Status: DC

## 2019-12-15 NOTE — Patient Instructions (Addendum)
A few things to remember from today's visit:   Stop Nexium. Start Protonix with empty stomach. You could take an Imodium every 2-3 days.  Try Ensure or boost , we need to prevent more wt loss.  GI appt will be arranged.  Please be sure medication list is accurate. If a new problem present, please set up appointment sooner than planned today.

## 2019-12-15 NOTE — Assessment & Plan Note (Signed)
Problem is not well controlled. Discontinue Nexium. She will start Protonix 40 mg daily. Continue GERD precautions.

## 2019-12-15 NOTE — Assessment & Plan Note (Signed)
We discussed possible etiologies. ?  IBS-D. Because problem is getting worse, GI evaluation will be arranged. Adequate hydration and low residual diet.

## 2019-12-15 NOTE — Progress Notes (Signed)
HPI:  Chief Complaint  Patient presents with  . digestive issues   [name] is a 85 y.o. female, who is here today with her daughter complaining of "digestive issues." She has had problem for about 2 years but has been worse for the past 7 to 8 months. Diarrhea, watery stools, exacerbated by oral intake, even liquids. Generalized abdominal cramps that are alleviated by defecation. Sometimes associated with nausea. Last week she had 2 episodes of vomiting in 1 day.  She can have at least 6 watery stools per day. Daughter concerned about wt loss. She does not eat to avoid GI symptoms.  S/P cholecystectomy.  She states that she has not taken Bentyl. Refill request early this month and Rx sent.  Negative for associated fever, chills, or changes in appetite. "Little fatigue." Imodium helps, when she takes it she has no bowel movement for 2 days and 3rd day she has a form stool then diarrhea on day 4th.  She has not noted blood in the stool, steatorrhea, or melena.  Lab Results  Component Value Date   TSH 0.64 06/10/2019   HTN: She is not checking BP at home. BP today mildly elevated. She is on Carvedilol 12.5 mg bid and Losartan 50 mg daily.  Lab Results  Component Value Date   CREATININE 0.78 09/30/2019   BUN 13 09/30/2019   NA 140 09/30/2019   K 3.9 09/30/2019   CL 106 09/30/2019   CO2 26 09/30/2019   Lab Results  Component Value Date   ALT 8 06/10/2019   AST 10 06/10/2019   ALKPHOS 48 06/10/2019   BILITOT 0.4 06/10/2019   Abdominal CT was done in 11/28/16 because of similar symptoms she is reporting today.  No acute findings.  No cause of the presenting symptoms is identified, no signs of bowel pathology. Aortic atherosclerosis without aneurysm.   GERD: Currently she is on Nexium 40 mg daily. For the past few months she feels like solids get stuck in the middle of her chest, she has to take fluids , so food can go down. + Heartburn, exacerbated by  any type of food intake.  Review of Systems  Constitutional: Negative for activity change and diaphoresis.  HENT: Negative for mouth sores and sore throat.   Respiratory: Negative for cough, shortness of breath and wheezing.   Cardiovascular: Negative for leg swelling.  Gastrointestinal:       Negative for changes in bowel habits.  Endocrine: Negative for cold intolerance and heat intolerance.  Genitourinary: Negative for decreased urine volume, dysuria and hematuria.  Skin: Negative for rash.  Neurological: Negative for syncope and weakness.  Psychiatric/Behavioral: Negative for confusion. The patient is nervous/anxious.   Rest see pertinent positives and negatives per HPI.  Current Outpatient Medications on File Prior to Visit  Medication Sig Dispense Refill  . albuterol (VENTOLIN HFA) 108 (90 Base) MCG/ACT inhaler TAKE 2 PUFFS BY MOUTH EVERY 6 HOURS AS NEEDED FOR WHEEZE OR SHORTNESS OF BREATH*NOT COVERED* 8.5 g 1  . atorvastatin (LIPITOR) 10 MG tablet Take 1 tablet (10 mg total) by mouth daily. 90 tablet 3  . carvedilol (COREG) 12.5 MG tablet TAKE 1 TABLET (12.5 MG TOTAL) BY MOUTH 2 (TWO) TIMES DAILY WITH A MEAL. 180 tablet 2  . [facility]S NICOTINE TRANSDERMAL SYS 14 MG/24HR patch PLACE 1 PATCH (14 MG TOTAL) ONTO THE SKIN DAILY. 28 patch 0  . diclofenac sodium (VOLTAREN) 1 % GEL Apply 4 g topically 4 (four) times  daily. 4 Tube 3  . dicyclomine (BENTYL) 10 MG capsule TAKE 1 CAPSULE (10 MG TOTAL) BY MOUTH 4 (FOUR) TIMES DAILY - BEFORE MEALS AND AT BEDTIME. 90 capsule 0  . dronedarone (MULTAQ) 400 MG tablet Take 1 tablet (400 mg total) by mouth daily. 90 tablet 3  . fenofibrate (TRICOR) 48 MG tablet TAKE 1 TABLET BY MOUTH EVERY DAY 90 tablet 3  . glucose blood (ONETOUCH VERIO) test strip Use to test 3-4 times daily. 300 strip 4  . levothyroxine (SYNTHROID) 112 MCG tablet TAKE 1 TABLET (112 MCG TOTAL) BY MOUTH DAILY BEFORE BREAKFAST. 90 tablet 2  . losartan (COZAAR) 50 MG tablet TAKE 2 TABLETS (  100 MG) BY MOUTH DAILY 180 tablet 1  . nitroGLYCERIN (NITROSTAT) 0.4 MG SL tablet Place 0.4 mg under the tongue every 5 (five) minutes x 3 doses as needed for chest pain (Max 3 doses within 15 min. Call 911).    . TRADJENTA 5 MG TABS tablet TAKE 1 TABLET (5 MG TOTAL) DAILY BY MOUTH.**NEED APPOINTMENT FOR REFILLS** 90 tablet 0  . XARELTO 15 MG TABS tablet TAKE 1 TABLET BY MOUTH DAILY WITH SUPPER. 30 tablet 5   No current facility-administered medications on file prior to visit.   Past Medical History:  Diagnosis Date  . CAD (coronary artery disease), native coronary artery    PTCA of distal right coronary artery 1997 Cypher stents to circumflex 2005 Cardiac cath in 2011 with patent stent to circumflex and moderate disease elsewhere treated medically   . Cardiac pacemaker in situ 12/24/2015   Original implant reportedly in 1991 for tachybradycardia syndrome, generator change in 1999, 2004 and evidently again in 2016 in [place]   . COPD (chronic obstructive pulmonary disease) ([place])   . Diabetes mellitus without complication ([place])   . Hypertension   . Hypothyroidism 03/23/2010  . Pacemaker   . Paroxysmal atrial fibrillation ([place]) 03/23/2010   CHA2DS2VASC score 5    No Known Allergies  Social History   Socioeconomic History  . Marital status: Widowed    Spouse name: Not on file  . Number of children: Not on file  . Years of education: Not on file  . Highest education level: Not on file  Occupational History  . Not on file  Tobacco Use  . Smoking status: Current Every Day Smoker    Packs/day: 0.30  . Smokeless tobacco: Never Used  Substance and Sexual Activity  . Alcohol use: No    Alcohol/week: 0.0 standard drinks  . Drug use: No  . Sexual activity: Not on file  Other Topics Concern  . Not on file  Social History Narrative  . Not on file   Social Determinants of Health   Financial Resource Strain:   . Difficulty of Paying Living Expenses:   Food Insecurity:   . Worried  About Charity fundraiser in the Last Year:   . Arboriculturist in the Last Year:   Transportation Needs:   . Film/video editor (Medical):   Marland Kitchen Lack of Transportation (Non-Medical):   Physical Activity:   . Days of Exercise per Week:   . Minutes of Exercise per Session:   Stress:   . Feeling of Stress :   Social Connections:   . Frequency of Communication with Friends and Family:   . Frequency of Social Gatherings with Friends and Family:   . Attends Religious Services:   . Active Member of Clubs or Organizations:   . Attends  Club or Organization Meetings:   Marland Kitchen Marital Status:     Vitals:   12/15/19 1039  BP: (!) 142/60  Pulse: 97  Resp: 16  SpO2: 97%   Body mass index is 21.58 kg/m.  Physical Exam  Nursing note and vitals reviewed. Constitutional: She is oriented to person, place, and time. She appears well-developed and well-nourished. No distress.  HENT:  Head: Normocephalic and atraumatic.  Mouth/Throat: Oropharynx is clear and moist and mucous membranes are normal.  Eyes: Conjunctivae are normal.  Cardiovascular: Normal rate and regular rhythm.  No murmur heard. Respiratory: Effort normal and breath sounds normal. No respiratory distress.  GI: Soft. She exhibits no mass. There is no hepatomegaly. There is no abdominal tenderness.  Musculoskeletal:        General: Edema (Trace pitting LE edema,bilateral.) present.  Lymphadenopathy:    She has no cervical adenopathy.  Neurological: She is alert and oriented to person, place, and time. She has normal strength. No cranial nerve deficit.  She can remember 3 words with second attempt. Mildly unstable gait,not assisted.    Skin: Skin is warm. No rash noted. No erythema.  Psychiatric: She has a normal mood and affect.  Well groomed, good eye contact.   ASSESSMENT AND PLAN:  [name] was seen today for digestive issues.  Diagnoses and all orders for this visit:  Orders Placed This Encounter  Procedures  .  Basic metabolic panel  . CBC with Differential/Platelet  . C-reactive protein  . Potassium  . Ambulatory referral to Gastroenterology   Lab Results  Component Value Date   CREATININE 0.59 12/15/2019   BUN 10 12/15/2019   NA 141 12/15/2019   K 3.0 (L) 12/15/2019   CL 103 12/15/2019   CO2 30 12/15/2019   Lab Results  Component Value Date   CRP <1.0 12/15/2019   Lab Results  Component Value Date   WBC 5.1 12/15/2019   HGB 11.6 (L) 12/15/2019   HCT 35.3 (L) 12/15/2019   MCV 86.7 12/15/2019   PLT 214.0 12/15/2019    Dysphagia, unspecified type ? Esophagitis/strectures. Instructed about warning signs. Recommend chewing slowly and small pieces at the time. Protonix started today.  Weight loss, abnormal Most likely related to decrease d oral intake. Recommend replacing meals with Boost/ensure.  GERD Problem is not well controlled. Discontinue Nexium. She will start Protonix 40 mg daily. Continue GERD precautions.   Chronic diarrhea We discussed possible etiologies. ?  IBS-D. Because problem is getting worse, GI evaluation will be arranged. Adequate hydration and low residual diet.   Return in about 3 months (around 03/16/2020).   Nizhoni Parlow G. [name], MD  [facility]. [place] office.   A few things to remember from today's visit:  Stop Nexium. Start Protonix with empty stomach. You could take an Imodium every 2-3 days.  Try Ensure or boost , we need to prevent more wt loss.  GI appt will be arranged.  Please be sure medication list is accurate. If a new problem present, please set up appointment sooner than planned today.

## 2019-12-16 ENCOUNTER — Other Ambulatory Visit: Payer: Self-pay | Admitting: Cardiology

## 2019-12-16 DIAGNOSIS — I480 Paroxysmal atrial fibrillation: Secondary | ICD-10-CM

## 2019-12-16 NOTE — Telephone Encounter (Signed)
Prescription refill request for Xarelto received.   Last office visit: [place], 11/10/2019 Weight: 53.5 kg Age: 85 y.o. Scr: 0.59, 12/15/2019 CrCl: 62 ml/min

## 2019-12-16 NOTE — Telephone Encounter (Signed)
Pt qualifies for 20mg  of Xarelto. Spoke with [facility]. Will send in prescription refill for Xarelto for 15mg . Will call pt to see if she can come in for repeat blood in 3 months to follow Scr trend.

## 2019-12-16 NOTE — Telephone Encounter (Signed)
Called and spoke to pt's daughter, [name], Informed her that we were going to refill the prescription for Xarelto but would like for pt come in for repeat blood work in 3 months to make sure she is on the correct dose of Xarelto. Scheduled pt for a lab appointment on 03/17/2020.   Prescription refill sent.

## 2019-12-22 ENCOUNTER — Other Ambulatory Visit: Payer: Self-pay | Admitting: Family Medicine

## 2019-12-22 DIAGNOSIS — E119 Type 2 diabetes mellitus without complications: Secondary | ICD-10-CM

## 2019-12-23 ENCOUNTER — Telehealth: Payer: Self-pay | Admitting: Family Medicine

## 2019-12-23 DIAGNOSIS — E119 Type 2 diabetes mellitus without complications: Secondary | ICD-10-CM

## 2019-12-23 MED ORDER — LOSARTAN POTASSIUM 50 MG PO TABS: ORAL_TABLET | ORAL | 1 refills | Status: DC

## 2019-12-23 MED ORDER — LINAGLIPTIN 5 MG PO TABS: ORAL_TABLET | ORAL | 1 refills | Status: DC

## 2019-12-23 NOTE — Telephone Encounter (Signed)
Rx's refilled & sent electronically.

## 2019-12-23 NOTE — Telephone Encounter (Signed)
Pt is requesting a refill on Losartan 50 mg tablet and Tradjenta 5 mg tabs tablet sent to [facility] on [address]. Thanks

## 2019-12-24 ENCOUNTER — Encounter: Payer: Self-pay | Admitting: Nurse Practitioner

## 2019-12-24 ENCOUNTER — Ambulatory Visit (INDEPENDENT_AMBULATORY_CARE_PROVIDER_SITE_OTHER): Payer: Medicare Other | Admitting: Nurse Practitioner

## 2019-12-24 VITALS — BP 160/62 | HR 58 | Temp 98.2°F | Ht 62.0 in | Wt 117.0 lb

## 2019-12-24 DIAGNOSIS — R197 Diarrhea, unspecified: Secondary | ICD-10-CM

## 2019-12-24 DIAGNOSIS — R634 Abnormal weight loss: Secondary | ICD-10-CM

## 2019-12-24 DIAGNOSIS — R1084 Generalized abdominal pain: Secondary | ICD-10-CM | POA: Diagnosis not present

## 2019-12-24 NOTE — Patient Instructions (Addendum)
If you are age 83 or older, your body mass index should be between 23-30. Your Body mass index is 21.4 kg/m. If this is out of the aforementioned range listed, please consider follow up with your Primary Care Provider.  If you are age 30 or younger, your body mass index should be between 19-25. Your Body mass index is 21.4 kg/m. If this is out of the aformentioned range listed, please consider follow up with your Primary Care Provider.   Follow up with me on 4/19/21at 10 am.  Thank you for choosing me and [facility].   Tye Savoy, NP

## 2019-12-24 NOTE — Progress Notes (Signed)
ASSESSMENT / PLAN:   85 yo female with PMH significant for hypothyroidism, CAD / stenting , PAF on Xarelto, PPM, COPD, DM, hyperlipidemia, GERD, chronic diarrhea  # Chronic diarrhea --Continue Bentyl 4 times daily for now ( recently started).  Will reassess need for it at our follow-up appointment. --Since she is having some normal bowel movements now after starting dicyclomine I agree with PCP about being cautious with use of Imodium.  She will assess bowel movements on 4 times daily dicyclomine and use Imodium once every 2 to 3 days if needed. --She says just one imodium can stop all BMs for a few days. Rather than "constipation"  I think it is more likely that her bowels are void of significant stools after after diarrhea.   # Weight loss. Other than being thin she looks and seems to fell well.  --She doesn't want to eat for fear of having diarrhea.  --I explained that she needs to be able to eat. We can manage the diarrhea. If resumes normal diet but weight loss persists then we need to explore other causes of the weight loss other than just her fear of eating.  --Will recheck weight at follow up visit in 3 weeks. Continue Nutritional drinks.    HPI:    Referring Provider: Lucio Edward, MD  Chief Complaint: Diarrhea   ** History comes from chart, patient and patient's daughter  [name] is an 85 year old female who I saw in 2018 for evaluation of diarrhea.  She says the diarrhea never really improved but has become more prominent now and associated with urgency. It is often postprandial but also occurs in the mornings before breakfast.   No blood in stool, no black stools.  She drinks Lactaid but does eat cheese and other dairy products.  Just before having a bowel movement patient has generalized abdominal pain which is relieved with defecation.  Patient does not want to eat due to the diarrhea.  Over the last few years she has been taking Imodium 1 tablet a day.   She is scared that taking more of it could lead to constipation.  After just 1 Imodium she may not have a bowel movement for 2 or 3 days.  She has lost down from 134 pounds in mid November to 117 pounds today.  No significant nausea.  She does feel like her food does not digest well, she gets full easily.  PCP Dr. [name] started her on dicyclomine 4 times daily earlier this month for after mentioned abdominal pain.  She has recently had 3 formed stools, unusual for her.  Patient was taking an Imodium, 1 about every day but tells me PCP asked her to decrease the dose to once every few days.  For weight loss she recently started supplemental drinks.   Past Medical History:  Diagnosis Date  . CAD (coronary artery disease), native coronary artery    PTCA of distal right coronary artery 1997 [place] stents to circumflex 2005 Cardiac cath in 2011 with patent stent to circumflex and moderate disease elsewhere treated medically   . Cardiac pacemaker in situ 12/24/2015   Original implant reportedly in 1991 for tachybradycardia syndrome, generator change in 1999, 2004 and evidently again in 2016 in [place]   . COPD (chronic obstructive pulmonary disease) ([place])   . Diabetes mellitus without complication ([place])   . Hypertension   . Hypothyroidism 03/23/2010  . Pacemaker   .  Paroxysmal atrial fibrillation ([place]) 03/23/2010   CHA2DS2VASC score 5      Past Surgical History:  Procedure Laterality Date  . CARDIAC CATHETERIZATION N/A 12/26/2015   Procedure: Left Heart Cath and Coronary Angiography;  Surgeon: Peter M [name], MD;  Location: [facility];  Service: Cardiovascular;  Laterality: N/A;  . CARDIOVERSION  05/2015  . CHOLECYSTECTOMY  2012  . CORONARY ANGIOPLASTY WITH STENT PLACEMENT  1990  . HERNIA REPAIR    . PACEMAKER GENERATOR CHANGE  [id number], 2016  . PACEMAKER INSERTION  1991   Family History  Problem Relation Age of Onset  . Heart disease Father   . Heart disease Mother   . Heart  attack Mother   . Cirrhosis Brother   . Colon cancer Neg Hx   . Stomach cancer Neg Hx    Social History   Tobacco Use  . Smoking status: Current Every Day Smoker    Packs/day: 0.30  . Smokeless tobacco: Never Used  Substance Use Topics  . Alcohol use: No    Alcohol/week: 0.0 standard drinks  . Drug use: No   Current Outpatient Medications  Medication Sig Dispense Refill  . albuterol (VENTOLIN HFA) 108 (90 Base) MCG/ACT inhaler TAKE 2 PUFFS BY MOUTH EVERY 6 HOURS AS NEEDED FOR WHEEZE OR SHORTNESS OF BREATH*NOT COVERED* 8.5 g 1  . atorvastatin (LIPITOR) 10 MG tablet Take 1 tablet (10 mg total) by mouth daily. 90 tablet 3  . carvedilol (COREG) 12.5 MG tablet TAKE 1 TABLET (12.5 MG TOTAL) BY MOUTH 2 (TWO) TIMES DAILY WITH A MEAL. 180 tablet 2  . [employer] NICOTINE TRANSDERMAL SYS 14 MG/24HR patch PLACE 1 PATCH (14 MG TOTAL) ONTO THE SKIN DAILY. 28 patch 0  . diclofenac sodium (VOLTAREN) 1 % GEL Apply 4 g topically 4 (four) times daily. 4 Tube 3  . dicyclomine (BENTYL) 10 MG capsule TAKE 1 CAPSULE (10 MG TOTAL) BY MOUTH 4 (FOUR) TIMES DAILY - BEFORE MEALS AND AT BEDTIME. 90 capsule 0  . dronedarone (MULTAQ) 400 MG tablet Take 1 tablet (400 mg total) by mouth daily. 90 tablet 3  . fenofibrate (TRICOR) 48 MG tablet TAKE 1 TABLET BY MOUTH EVERY DAY 90 tablet 3  . glucose blood (ONETOUCH VERIO) test strip Use to test 3-4 times daily. 300 strip 4  . levothyroxine (SYNTHROID) 112 MCG tablet TAKE 1 TABLET (112 MCG TOTAL) BY MOUTH DAILY BEFORE BREAKFAST. 90 tablet 2  . linagliptin (TRADJENTA) 5 MG TABS tablet TAKE 1 TABLET (5 MG TOTAL) DAILY BY MOUTH. 90 tablet 1  . losartan (COZAAR) 50 MG tablet Take 2 tablets by mouth daily. 180 tablet 1  . nitroGLYCERIN (NITROSTAT) 0.4 MG SL tablet Place 0.4 mg under the tongue every 5 (five) minutes x 3 doses as needed for chest pain (Max 3 doses within 15 min. Call 911).    . pantoprazole (PROTONIX) 40 MG tablet Take 1 tablet (40 mg total) by mouth daily. 30 tablet  3  . potassium chloride SA (KLOR-CON) 20 MEQ tablet 1 tab bid x 2 then 1 tab daily 30 tablet 3  . XARELTO 15 MG TABS tablet TAKE 1 TABLET BY MOUTH DAILY WITH SUPPER. 30 tablet 3   No current facility-administered medications for this visit.   No Known Allergies   Review of Systems: All systems reviewed and negative except where noted in HPI.   Serum creatinine: 0.59 mg/dL 12/15/19 1123 Estimated creatinine clearance: 42.9 mL/min   Physical Exam:    Wt Readings from  Last 3 Encounters:  12/24/19 117 lb (53.1 kg)  12/15/19 118 lb (53.5 kg)  11/10/19 124 lb 9.6 oz (56.5 kg)    BP (!) 160/62   Pulse (!) 58   Temp 98.2 F (36.8 C)   Ht 5\' 2"  (1.575 m)   Wt 117 lb (53.1 kg)   LMP  (LMP Unknown)   BMI 21.40 kg/m  Constitutional:  Pleasant female in no acute distress. Psychiatric: Normal mood and affect. Behavior is normal. EENT: Pupils normal.  Conjunctivae are normal. No scleral icterus. Neck supple.  Cardiovascular: Normal rate, regular rhythm. No edema Pulmonary/chest: Effort normal and breath sounds normal. No wheezing, rales or rhonchi. Abdominal: Soft, nondistended, nontender. Bowel sounds active throughout. There are no masses palpable. No hepatomegaly. Neurological: Alert and oriented to person place and time. Skin: Skin is warm and dry. No rashes noted.  Tye Savoy, NP  12/24/2019, 10:02 AM  Cc:  Referring Provider [name], [name], MD

## 2019-12-27 ENCOUNTER — Encounter (HOSPITAL_COMMUNITY): Payer: Self-pay | Admitting: Emergency Medicine

## 2019-12-27 ENCOUNTER — Emergency Department (HOSPITAL_COMMUNITY): Payer: Medicare Other

## 2019-12-27 ENCOUNTER — Emergency Department (HOSPITAL_COMMUNITY)
Admission: EM | Admit: 2019-12-27 | Discharge: 2019-12-28 | Disposition: A | Discharge status: Home / Self Care | Payer: Medicare Other | Attending: Emergency Medicine | Admitting: Emergency Medicine

## 2019-12-27 ENCOUNTER — Other Ambulatory Visit: Payer: Self-pay

## 2019-12-27 DIAGNOSIS — Z7901 Long term (current) use of anticoagulants: Secondary | ICD-10-CM | POA: Insufficient documentation

## 2019-12-27 DIAGNOSIS — E119 Type 2 diabetes mellitus without complications: Secondary | ICD-10-CM | POA: Diagnosis not present

## 2019-12-27 DIAGNOSIS — F17210 Nicotine dependence, cigarettes, uncomplicated: Secondary | ICD-10-CM | POA: Insufficient documentation

## 2019-12-27 DIAGNOSIS — R1013 Epigastric pain: Secondary | ICD-10-CM | POA: Diagnosis not present

## 2019-12-27 DIAGNOSIS — J449 Chronic obstructive pulmonary disease, unspecified: Secondary | ICD-10-CM | POA: Diagnosis not present

## 2019-12-27 DIAGNOSIS — Z79899 Other long term (current) drug therapy: Secondary | ICD-10-CM | POA: Insufficient documentation

## 2019-12-27 DIAGNOSIS — R079 Chest pain, unspecified: Secondary | ICD-10-CM | POA: Diagnosis not present

## 2019-12-27 DIAGNOSIS — Z950 Presence of cardiac pacemaker: Secondary | ICD-10-CM | POA: Insufficient documentation

## 2019-12-27 DIAGNOSIS — E039 Hypothyroidism, unspecified: Secondary | ICD-10-CM | POA: Insufficient documentation

## 2019-12-27 DIAGNOSIS — I10 Essential (primary) hypertension: Secondary | ICD-10-CM | POA: Diagnosis not present

## 2019-12-27 DIAGNOSIS — R197 Diarrhea, unspecified: Secondary | ICD-10-CM | POA: Diagnosis not present

## 2019-12-27 DIAGNOSIS — I4891 Unspecified atrial fibrillation: Secondary | ICD-10-CM | POA: Insufficient documentation

## 2019-12-27 DIAGNOSIS — R1084 Generalized abdominal pain: Secondary | ICD-10-CM | POA: Diagnosis not present

## 2019-12-27 DIAGNOSIS — N281 Cyst of kidney, acquired: Secondary | ICD-10-CM | POA: Diagnosis not present

## 2019-12-27 DIAGNOSIS — R109 Unspecified abdominal pain: Secondary | ICD-10-CM | POA: Diagnosis present

## 2019-12-27 DIAGNOSIS — I2510 Atherosclerotic heart disease of native coronary artery without angina pectoris: Secondary | ICD-10-CM | POA: Insufficient documentation

## 2019-12-27 DIAGNOSIS — Z743 Need for continuous supervision: Secondary | ICD-10-CM | POA: Diagnosis not present

## 2019-12-27 LAB — CBC
HCT: 34.8 % — ABNORMAL LOW (ref 36.0–46.0)
Hemoglobin: 10.7 g/dL — ABNORMAL LOW (ref 12.0–15.0)
MCH: 27.8 pg (ref 26.0–34.0)
MCHC: 30.7 g/dL (ref 30.0–36.0)
MCV: 90.4 fL (ref 80.0–100.0)
Platelets: 213 10*3/uL (ref 150–400)
RBC: 3.85 MIL/uL — ABNORMAL LOW (ref 3.87–5.11)
RDW: 15 % (ref 11.5–15.5)
WBC: 4.8 10*3/uL (ref 4.0–10.5)
nRBC: 0 % (ref 0.0–0.2)

## 2019-12-27 LAB — URINALYSIS, ROUTINE W REFLEX MICROSCOPIC
Bacteria, UA: NONE SEEN
Bilirubin Urine: NEGATIVE
Glucose, UA: NEGATIVE mg/dL
Ketones, ur: NEGATIVE mg/dL
Nitrite: NEGATIVE
Protein, ur: NEGATIVE mg/dL
Specific Gravity, Urine: 1.011 (ref 1.005–1.030)
pH: 7 (ref 5.0–8.0)

## 2019-12-27 LAB — COMPREHENSIVE METABOLIC PANEL
ALT: 12 U/L (ref 0–44)
AST: 15 U/L (ref 15–41)
Albumin: 3 g/dL — ABNORMAL LOW (ref 3.5–5.0)
Alkaline Phosphatase: 52 U/L (ref 38–126)
Anion gap: 7 (ref 5–15)
BUN: 5 mg/dL — ABNORMAL LOW (ref 8–23)
CO2: 27 mmol/L (ref 22–32)
Calcium: 9 mg/dL (ref 8.9–10.3)
Chloride: 106 mmol/L (ref 98–111)
Creatinine, Ser: 0.54 mg/dL (ref 0.44–1.00)
GFR calc Af Amer: >60 mL/min (ref >60)
GFR calc non Af Amer: >60 mL/min (ref >60)
Glucose, Bld: 99 mg/dL (ref 70–99)
Potassium: 3.4 mmol/L — ABNORMAL LOW (ref 3.5–5.1)
Sodium: 140 mmol/L (ref 135–145)
Total Bilirubin: 0.5 mg/dL (ref 0.3–1.2)
Total Protein: 6.7 g/dL (ref 6.5–8.1)

## 2019-12-27 LAB — LIPASE, BLOOD: Lipase: 24 U/L (ref 11–51)

## 2019-12-27 LAB — TROPONIN I (HIGH SENSITIVITY)
Troponin I (High Sensitivity): 8 ng/L (ref <18)
Troponin I (High Sensitivity): 8 ng/L (ref <18)

## 2019-12-27 MED ORDER — IOHEXOL 350 MG/ML SOLN
100.0000 mL | Freq: Once | INTRAVENOUS | Status: AC | PRN
  Administered 2019-12-27: 100 mL via INTRAVENOUS

## 2019-12-27 MED ORDER — SODIUM CHLORIDE 0.9% FLUSH: 3.0000 mL | Freq: Once | INTRAVENOUS | Status: DC

## 2019-12-27 NOTE — ED Provider Notes (Signed)
[facility] EMERGENCY DEPARTMENT Provider Note   CSN: [id number] Arrival date & time: 12/27/19  1537     History Chief Complaint  Patient presents with  . Abdominal Pain    [name] is a 85 y.o. female.  HPI Patient is an 85 year old female with a PMH of hypothyroidism, CAD, paroxysmal A. fib on Xarelto, GERD, COPD, type 2 diabetes and hyperlipidemia presenting to the ED today with abdominal pain.  Patient reports that she has had similar pain for some time; however, today it's severity worsened.  She describes it as sharp pain that begins in her epigastric region and radiates between her shoulder blades and down towards her waist.  She says that the pain was so bad this morning that it woke her from sleep.  Her daughter called 911 due to the severity of the pain and by the time EMS arrived, her pain had resolved.  Food usually helps the pain; however, she has been feeling increasingly more full and has been able to tolerate less oral intake and has subsequently lost weight.  She says that the pain also usually improves with belching.  She does endorse feeling a fullness in her left chest after the pain resolves.  Patient's last bowel movement was last night and says that it was watery to solid.  This morning, she experienced some shortness of breath that she attributes to the severity of the pain.  She denies fever, chills, vomiting, cough or congestion.  She sees a GI specialist for this pain and she was taken off of Nexium 1 month ago and was recently placed on dicyclomine for diarrhea.    Past Medical History:  Diagnosis Date  . CAD (coronary artery disease), native coronary artery    PTCA of distal right coronary artery 1997 Cypher stents to circumflex 2005 Cardiac cath in 2011 with patent stent to circumflex and moderate disease elsewhere treated medically   . Cardiac pacemaker in situ 12/24/2015   Original implant reportedly in 1991 for tachybradycardia syndrome,  generator change in 1999, 2004 and evidently again in 2016 in [place]   . COPD (chronic obstructive pulmonary disease) ([place])   . Diabetes mellitus without complication ([place])   . Hypertension   . Hypothyroidism 03/23/2010  . Pacemaker   . Paroxysmal atrial fibrillation ([place]) 03/23/2010   CHA2DS2VASC score 5     Patient Active Problem List   Diagnosis Date Noted  . Chronic diarrhea 12/15/2019  . Hyperlipidemia associated with type 2 diabetes mellitus ([place]) 06/10/2019  . Chronic rhinitis 02/28/2018  . Tobacco use disorder 05/10/2017  . Hypertension with heart disease 05/10/2017  . Chest pain 03/10/2017  . Acute thoracic back pain   . Cardiac pacemaker in situ 12/24/2015  . History of Graves' disease 12/24/2015  . Current use of long term anticoagulation 12/24/2015  . Type 2 diabetes mellitus with other specified complication ([place])   . Hypothyroidism 03/23/2010  . Paroxysmal atrial fibrillation ([place]) 03/23/2010  . COPD (chronic obstructive pulmonary disease) ([place]) 03/23/2010  . CAD (coronary artery disease), native coronary artery   . GERD     Past Surgical History:  Procedure Laterality Date  . CARDIAC CATHETERIZATION N/A 12/26/2015   Procedure: Left Heart Cath and Coronary Angiography;  Surgeon: Peter M [name], MD;  Location: [facility];  Service: Cardiovascular;  Laterality: N/A;  . CARDIOVERSION  05/2015  . CHOLECYSTECTOMY  2012  . CORONARY ANGIOPLASTY WITH STENT PLACEMENT  1990  . HERNIA REPAIR    .  PACEMAKER GENERATOR CHANGE  [id number], 2016  . PACEMAKER INSERTION  1991     OB History   No obstetric history on file.     Family History  Problem Relation Age of Onset  . Heart disease Father   . Heart disease Mother   . Heart attack Mother   . Cirrhosis Brother   . Colon cancer Neg Hx   . Stomach cancer Neg Hx     Social History   Tobacco Use  . Smoking status: Current Every Day Smoker    Packs/day: 0.30  . Smokeless tobacco: Never Used  Substance  Use Topics  . Alcohol use: No    Alcohol/week: 0.0 standard drinks  . Drug use: No    Home Medications Prior to Admission medications   Medication Sig Start Date End Date Taking? Authorizing Provider  albuterol (VENTOLIN HFA) 108 (90 Base) MCG/ACT inhaler TAKE 2 PUFFS BY MOUTH EVERY 6 HOURS AS NEEDED FOR WHEEZE OR SHORTNESS OF BREATH*NOT COVERED* 03/30/19   [name], [name], MD  atorvastatin (LIPITOR) 10 MG tablet Take 1 tablet (10 mg total) by mouth daily. 01/29/19   Isaiah Serge, NP  carvedilol (COREG) 12.5 MG tablet TAKE 1 TABLET (12.5 MG TOTAL) BY MOUTH 2 (TWO) TIMES DAILY WITH A MEAL. 10/12/19   Laurey Morale, MD  [facility] NICOTINE TRANSDERMAL SYS 14 MG/24HR patch PLACE 1 PATCH (14 MG TOTAL) ONTO THE SKIN DAILY. 12/26/18   [name], [name], MD  diclofenac sodium (VOLTAREN) 1 % GEL Apply 4 g topically 4 (four) times daily. 11/12/18   [name], [name], MD  dicyclomine (BENTYL) 10 MG capsule TAKE 1 CAPSULE (10 MG TOTAL) BY MOUTH 4 (FOUR) TIMES DAILY - BEFORE MEALS AND AT BEDTIME. 12/01/19   [name], [name], MD  dronedarone (MULTAQ) 400 MG tablet Take 1 tablet (400 mg total) by mouth daily. 03/13/19   [name], MD  fenofibrate (TRICOR) 48 MG tablet TAKE 1 TABLET BY MOUTH EVERY DAY 11/13/19   [name], [name], MD  glucose blood [facility] VERIO) test strip Use to test 3-4 times daily. 09/08/19   [name], [name], MD  levothyroxine (SYNTHROID) 112 MCG tablet TAKE 1 TABLET (112 MCG TOTAL) BY MOUTH DAILY BEFORE BREAKFAST. 07/01/19   [name], [name], MD  linagliptin (TRADJENTA) 5 MG TABS tablet TAKE 1 TABLET (5 MG TOTAL) DAILY BY MOUTH. 12/23/19   [name], [name], MD  losartan (COZAAR) 50 MG tablet Take 2 tablets by mouth daily. 12/23/19   [name], [name], MD  nitroGLYCERIN (NITROSTAT) 0.4 MG SL tablet Place 0.4 mg under the tongue every 5 (five) minutes x 3 doses as needed for chest pain (Max 3 doses within 15 min. Call 911).    [provider]  pantoprazole (PROTONIX) 40 MG tablet Take 1 tablet (40 mg  total) by mouth daily. 12/15/19   [name], [name], MD  potassium chloride SA (KLOR-CON) 20 MEQ tablet 1 tab bid x 2 then 1 tab daily 12/15/19 01/15/20  [name], [name], MD  XARELTO 15 MG TABS tablet TAKE 1 TABLET BY MOUTH DAILY WITH SUPPER. 12/16/19   Camnitz, Ocie Doyne, MD    Allergies    Patient has no known allergies.  Review of Systems   Review of Systems  Constitutional: Positive for appetite change. Negative for chills and fever.  HENT: Negative for rhinorrhea and sore throat.   Eyes: Negative for photophobia and visual disturbance.  Respiratory: Negative for cough, shortness of breath and wheezing.  Cardiovascular: Negative for chest pain and palpitations.  Gastrointestinal: Positive for abdominal pain. Negative for abdominal distention, constipation, diarrhea, nausea and vomiting.  Genitourinary: Negative for dysuria and hematuria.  Musculoskeletal: Positive for back pain. Negative for arthralgias.  Skin: Negative for color change and rash.  Neurological: Negative for weakness and light-headedness.  Psychiatric/Behavioral: Negative for agitation.  All other systems reviewed and are negative.   Physical Exam Updated Vital Signs BP (!) 144/41 (BP Location: Right Arm)   Pulse 71   Temp 98.5 F (36.9 C) (Oral)   Resp (!) 22   Ht 5\' 2"  (1.575 m)   Wt 53.1 kg   LMP  (LMP Unknown)   SpO2 97%   BMI 21.40 kg/m   Physical Exam Vitals and nursing note reviewed.  Constitutional:      General: She is not in acute distress.    Appearance: Normal appearance. She is well-developed. She is not ill-appearing.  HENT:     Head: Normocephalic and atraumatic.     Right Ear: External ear normal.     Left Ear: External ear normal.     Nose: Nose normal. No congestion or rhinorrhea.     Mouth/Throat:     Mouth: Mucous membranes are moist.     Pharynx: Oropharynx is clear.  Eyes:     Extraocular Movements: Extraocular movements intact.     Pupils: Pupils are equal, round, and  reactive to light.  Cardiovascular:     Rate and Rhythm: Normal rate and regular rhythm.     Pulses: Normal pulses.     Heart sounds: Normal heart sounds.  Pulmonary:     Effort: Pulmonary effort is normal. No respiratory distress.     Comments: Lung sounds diminished throughout. Abdominal:     General: There is no distension.     Palpations: Abdomen is soft. There is no mass.     Tenderness: There is no abdominal tenderness. There is no right CVA tenderness, left CVA tenderness, guarding or rebound.  Musculoskeletal:        General: Normal range of motion.     Cervical back: Normal range of motion and neck supple.     Right lower leg: No edema.     Left lower leg: No edema.  Skin:    General: Skin is warm and dry.     Capillary Refill: Capillary refill takes less than 2 seconds.  Neurological:     General: No focal deficit present.     Mental Status: She is alert and oriented to person, place, and time. Mental status is at baseline.  Psychiatric:        Mood and Affect: Mood normal.     ED Results / Procedures / Treatments   Labs (all labs ordered are listed, but only abnormal results are displayed) Labs Reviewed  COMPREHENSIVE METABOLIC PANEL - Abnormal; Notable for the following components:      Result Value   Potassium 3.4 (*)    BUN 5 (*)    Albumin 3.0 (*)    All other components within normal limits  CBC - Abnormal; Notable for the following components:   RBC 3.85 (*)    Hemoglobin 10.7 (*)    HCT 34.8 (*)    All other components within normal limits  URINALYSIS, ROUTINE W REFLEX MICROSCOPIC - Abnormal; Notable for the following components:   Hgb urine dipstick SMALL (*)    Leukocytes,Ua TRACE (*)    All other components within normal limits  LIPASE,  BLOOD  TROPONIN I (HIGH SENSITIVITY)  TROPONIN I (HIGH SENSITIVITY)    EKG EKG Interpretation  Date/Time:  Sunday December 27 2019 15:41:29 EDT Ventricular Rate:  80 PR Interval:  228 QRS Duration: 86 QT  Interval:  396 QTC Calculation: 456 R Axis:   76 Text Interpretation: Atrial-paced rhythm with prolonged AV conduction Nonspecific T wave abnormality Abnormal ECG No acute changes No significant change since last tracing Confirmed by Varney Biles [phone]) on 12/27/2019 6:35:11 PM   Radiology DG Abdomen Acute W/Chest  Result Date: 12/27/2019 CLINICAL DATA:  Epigastric abdominal pain radiating to back, diarrhea EXAM: DG ABDOMEN ACUTE W/ 1V CHEST COMPARISON:  11/28/2016 FINDINGS: Supine and upright frontal views of the abdomen as well as an upright frontal view of the chest are obtained. The cardiac silhouette is unremarkable. There is mild central vascular congestion and background emphysema. No airspace disease, effusion, or pneumothorax. Bowel gas pattern is unremarkable without obstruction or ileus. Scattered gas fluid levels extending through the transverse colon are nonspecific, and could relate to the patient's complaint of diarrhea. No masses or abnormal calcifications. No free gas in the greater peritoneal sac. Postsurgical changes right hip. IMPRESSION: 1. Colonic gas fluid levels are nonspecific, but could relate to the patient's diarrhea. 2. No bowel obstruction or ileus. 3. Background emphysema, no acute intrathoracic process. Electronically Signed   By: Randa Ngo M.D.   On: 12/27/2019 20:02   CT Angio Chest/Abd/Pel for Dissection W and/or Wo Contrast  Result Date: 12/28/2019 CLINICAL DATA:  Chest and abdominal pain EXAM: CT ANGIOGRAPHY CHEST, ABDOMEN AND PELVIS TECHNIQUE: Multidetector CT imaging through the chest, abdomen and pelvis was performed using the standard protocol during bolus administration of intravenous contrast. Multiplanar reconstructed images and MIPs were obtained and reviewed to evaluate the vascular anatomy. CONTRAST:  181mL OMNIPAQUE IOHEXOL 350 MG/ML SOLN COMPARISON:  11/28/2016 CT abdomen and pelvis and plain film exam from earlier in the same day. FINDINGS: CTA  CHEST FINDINGS Cardiovascular: Thoracic aorta and its branches demonstrate atherosclerotic calcifications with a normal branching pattern. No aneurysmal dilatation or dissection is seen. Pulmonary artery shows a normal branching pattern. No filling defects are identified to suggest pulmonary embolism. Coronary calcifications are seen. Mild cardiac enlargement is noted. Pacing device is noted as well. Considerable venous collateralization is noted along the right chest wall likely related to the prior pacing device. Mediastinum/Nodes: Thoracic inlet is within normal limits. The esophagus as visualized is unremarkable. Diffuse lymphadenopathy is identified within the mediastinum. A 3.0 cm subcarinal node is noted. A precarinal node which measures at least 2.3 cm is seen and a pre-vascular lymph node mass which measures 1.8 cm is noted. Smaller pretracheal nodes are seen. Additionally there is a 18 mm right hilar lymph node and 11 mm left hilar lymph node identified. Some smaller scattered hilar nodes are noted as well. Lungs/Pleura: Emphysematous changes are seen. Additionally there is a mass lesion in the left lower lobe which measures approximately 2.1 by 1.7 cm consistent with a primary pulmonary neoplasm till proven otherwise. In the right upper lobe there is a somewhat spiculated lesion identified best seen on image number 49 of series 8 along the lateral aspect of the mediastinum projecting in the right upper lobe. This measures 3.1 x 2.4 cm and is also consistent with pulmonary neoplasm till proven otherwise. Moderate right-sided pleural effusion is seen as well as some right lower lobe atelectasis. Musculoskeletal: Degenerative changes of the thoracic spine are seen. No acute rib abnormality is noted. Review  of the MIP images confirms the above findings. CTA ABDOMEN AND PELVIS FINDINGS VASCULAR Aorta: Abdominal aorta demonstrates atherosclerotic calcifications. No evidence of aneurysmal dilatation or  dissection is seen. Celiac: Atherosclerotic calcifications are noted although no focal stenosis of the celiac axis is seen. SMA: Atherosclerotic calcifications are noted without focal stenosis. Renals: Heavy atherosclerotic calcifications are seen within single renal arteries bilaterally. IMA: Patent without evidence of aneurysm, dissection, vasculitis or significant stenosis. Iliacs: Diffuse vascular calcifications are seen without aneurysmal dilatation. Veins: No specific venous abnormality is noted. Review of the MIP images confirms the above findings. NON-VASCULAR Hepatobiliary: Gallbladder has been surgically removed. Liver appears within normal limits. Pancreas: Unremarkable. No pancreatic ductal dilatation or surrounding inflammatory changes. Spleen: Normal in size without focal abnormality. Adrenals/Urinary Tract: Adrenal glands are within normal limits. Normal enhancement of the kidneys is seen bilaterally. No renal calculi is are seen. Stable left renal cyst is noted. The bladder is well distended. Stomach/Bowel: No obstructive or inflammatory changes of the colon are seen. The appendix is within normal limits. Small bowel and stomach appear unremarkable. Lymphatic: No significant lymphadenopathy is seen. Reproductive: Uterus and bilateral adnexa are unremarkable. Other: No abdominal wall hernia or abnormality. No abdominopelvic ascites. Musculoskeletal: Postsurgical changes in the proximal right femur are seen. Degenerative changes of the lumbar spine are noted. Degenerative changes about the sacroiliac joints are seen as well. No lytic or sclerotic lesions are seen. Review of the MIP images confirms the above findings. IMPRESSION: No evidence of dissection or aneurysmal dilatation is identified. No evidence of pulmonary emboli. Bilateral lung masses in the right upper and left lower lobe with associated hilar and mediastinal adenopathy consistent with neoplasm. Right-sided pleural effusion is noted as  well. Further workup by means of tissue sampling and PET-CT would be helpful. No acute abnormality in the abdomen is seen. Aortic Atherosclerosis (ICD10-I70.0) and Emphysema (ICD10-J43.9). Electronically Signed   By: Inez Catalina M.D.   On: 12/28/2019 00:09    Procedures Procedures (including critical care time)  Medications Ordered in ED Medications  sodium chloride flush (NS) 0.9 % injection 3 mL (3 mLs Intravenous Not Given 12/27/19 2122)  iohexol (OMNIPAQUE) 350 MG/ML injection 100 mL (100 mLs Intravenous Contrast Given 12/27/19 2349)    ED Course  I have reviewed the triage vital signs and the nursing notes.  Pertinent labs & imaging results that were available during my care of the patient were reviewed by me and considered in my medical decision making (see chart for details).    MDM Rules/Calculators/A&P                     Patient is an 85 year old female with a PMH of hypothyroidism, CAD, paroxysmal A. fib on Xarelto, GERD, COPD, type 2 diabetes and hyperlipidemia presenting to the ED today with abdominal pain that radiates to her back.  Physical exam unremarkable.  Vital signs stable.  Afebrile.  On arrival, patient appears generally well and is displaying no signs of acute distress.  Patient reports many episodes of similar pain in the past; however, the severity has increased today.  She says the pain has completely resolved prior to arrival to the ED.  We will collect CBC, CMP, lipase, urinalysis for further evaluation of her abdominal pain.  We will also collect serial troponins to evaluate for atypical angina.  EKG shows atrially paced rhythm with no signs of acute ischemia.  CBC with hemoglobin 10.7, which is slightly lower than previous.  CMP with albumin  3.0.  Lipase unremarkable.  Urinalysis with small amount of hemoglobin and trace leukocytes.  Serial troponins 8 and 8, consecutively.  Obtained acute abdominal series which showed no signs of free air under the abdomen.   Based off of this initial work-up and presentation, low suspicion for appendicitis, cholecystitis, colitis, cystitis, pyelonephritis, ovarian pathology, perforated ulcer, ACS.  Patient's pain may be related to reflux.  She is currently being worked up for this pain with GI and has a follow-up appointment scheduled on 4/19.  On reassessment, patient currently having return of abdominal pain and is diffusely tender on palpation.  Will obtain CTA dissection study to evaluate for dissection given radiating nature of her abdominal pain.  CTA study shows no evidence of dissection, aneurysm or PE.  However, does show bilateral lung masses in the right upper and left lower lobes with mediastinal adenopathy concerning for neoplasm.  Discussed these results with patient and her daughter who is at bedside.  Unsure if this has any relation to patient's persistent pain.  However, strongly encouraged them to follow-up with both her PCP as well as to establish care with an oncologist.  Placed contact information for oncology clinic in her discharge paperwork.  Also recommended that she keep her follow-up GI appointment next month.  No further work-up or intervention required while in the ED.  Patient expresses understanding and is in agreement with plan.  Provided strict return precautions.  Patient stable at time of discharge.  Patient assessed and evaluated with Dr. Kathrynn Humble.  Nadeen Landau, MD     Final Clinical Impression(s) / ED Diagnoses Final diagnoses:  Epigastric pain    Rx / DC Orders ED Discharge Orders    None       Nadeen Landau, MD 12/28/19 [id number]    Varney Biles, MD 12/29/19 0383

## 2019-12-27 NOTE — ED Triage Notes (Signed)
Pt to triage via [facility].  Reports upper abd/epigastric pain since yesterday that radiates to back.  Reports diarrhea x 1 month.  States she feels better after eating and after drinking carbonated beverages and belching.  States she is only able to eat small meals.  PCP took her off Nexium 1 month ago.

## 2019-12-28 ENCOUNTER — Encounter: Payer: Self-pay | Admitting: Nurse Practitioner

## 2019-12-28 DIAGNOSIS — I700 Atherosclerosis of aorta: Secondary | ICD-10-CM | POA: Insufficient documentation

## 2019-12-28 NOTE — Progress Notes (Signed)
Reviewed and agree with management plan.  Arvie Bartholomew T. Rafik Koppel, MD FACG [facility]  

## 2019-12-28 NOTE — Discharge Instructions (Signed)
  CT Chest Abdomen Pelvis: Bilateral lung masses in the right upper and left lower lobe with associated hilar and mediastinal adenopathy consistent with neoplasm. Right-sided pleural effusion is noted as well. Further workup by means of tissue sampling and PET-CT would be helpful.

## 2019-12-29 ENCOUNTER — Telehealth: Payer: Self-pay | Admitting: Internal Medicine

## 2019-12-29 ENCOUNTER — Other Ambulatory Visit: Payer: Self-pay

## 2019-12-29 NOTE — Telephone Encounter (Signed)
Received a new pt referral for multiple lung masses. [name] has been cld and scheduled to see Dr. Julien Nordmann 4/6 at 2:15pm w/labs at 1:45pm. Pt aware to arrive 15 minutes early.

## 2019-12-30 ENCOUNTER — Other Ambulatory Visit: Payer: Medicare Other

## 2019-12-30 ENCOUNTER — Ambulatory Visit (INDEPENDENT_AMBULATORY_CARE_PROVIDER_SITE_OTHER): Payer: Medicare Other | Admitting: Family Medicine

## 2019-12-30 ENCOUNTER — Encounter: Payer: Self-pay | Admitting: Family Medicine

## 2019-12-30 ENCOUNTER — Other Ambulatory Visit: Payer: Self-pay | Admitting: Family Medicine

## 2019-12-30 VITALS — BP 128/64 | HR 64 | Temp 95.8°F | Resp 16 | Ht 62.0 in | Wt 114.4 lb

## 2019-12-30 DIAGNOSIS — I25119 Atherosclerotic heart disease of native coronary artery with unspecified angina pectoris: Secondary | ICD-10-CM

## 2019-12-30 DIAGNOSIS — I119 Hypertensive heart disease without heart failure: Secondary | ICD-10-CM

## 2019-12-30 DIAGNOSIS — R634 Abnormal weight loss: Secondary | ICD-10-CM

## 2019-12-30 DIAGNOSIS — E876 Hypokalemia: Secondary | ICD-10-CM | POA: Diagnosis not present

## 2019-12-30 DIAGNOSIS — R109 Unspecified abdominal pain: Secondary | ICD-10-CM

## 2019-12-30 DIAGNOSIS — J42 Unspecified chronic bronchitis: Secondary | ICD-10-CM | POA: Diagnosis not present

## 2019-12-30 DIAGNOSIS — C3490 Malignant neoplasm of unspecified part of unspecified bronchus or lung: Secondary | ICD-10-CM

## 2019-12-30 MED ORDER — NITROGLYCERIN 0.4 MG SL SUBL: 0.4000 mg | SUBLINGUAL_TABLET | SUBLINGUAL | 0 refills | Status: DC | PRN

## 2019-12-30 NOTE — Progress Notes (Signed)
HPI:  [name] is a 85 y.o. female, who is here today with her daughter to follow on recent ER visit.  She presented to the ER on 12/27/19 c/o epigastric abdominal pain, which she has had intermittently for a while.  Pain was severe radiated to her back, so chest and abdominal CT were done concerned about aneurysm may be causing pain.  Incidentally lung masses were seen, given her hx of smoking and age most likely malignancy. Smoker, she has not used nicotine patches.  COPD, she is not using Wixela 250-50 mcg because afraid of getting thrush. She is using Albuterol inh a few times per week.She thinks Albuterol inh is all she needs. She denies SOB or wheezing. Productive cough in the morning,denies hemoptysis.  -She has seen GI for abdominal pain and chronic diarrhea. According to daughter,she was instructed to monitor wt at home. Bentyl 10 mg has helped with symptoms. No nausea,vomiting,or blood in stool.  -HTN and CAD, requesting refills for SL nitroglycerine. She has not needed med in years. She is on Carvedilol 12.5 mg bid and Losartan 50 mg daily. Atrial fib on Xarelto. HypoK+, she is on KCL 20 meq daily. Negative for CP,SOB,palpitations,or diaphoresis.  Lab Results  Component Value Date   CREATININE 0.54 12/27/2019   BUN 5 (L) 12/27/2019   NA 140 12/27/2019   K 3.4 (L) 12/27/2019   CL 106 12/27/2019   CO2 27 12/27/2019   Pain is exacerbated by food intake. Her daughter is concerned about wt loss. She has npt noted fever,chills,or night sweats.  Review of Systems  Constitutional: Negative for activity change and fatigue.  HENT: Negative for mouth sores, nosebleeds, sore throat and trouble swallowing.   Eyes: Negative for redness and visual disturbance.  Respiratory: Negative for chest tightness.   Cardiovascular: Negative for leg swelling.  Gastrointestinal: Negative for abdominal pain, nausea and vomiting.       Negative for changes in bowel habits.    Genitourinary: Negative for decreased urine volume, dysuria and hematuria.  Musculoskeletal: Negative for gait problem and myalgias.  Skin: Negative for rash.  Neurological: Negative for syncope, weakness and headaches.  Psychiatric/Behavioral: Negative for confusion.  Rest see pertinent positives and negatives per HPI.  Current Outpatient Medications on File Prior to Visit  Medication Sig Dispense Refill  . acetaminophen (TYLENOL) 500 MG tablet Take 500 mg by mouth every 6 (six) hours as needed for headache.    . albuterol (VENTOLIN HFA) 108 (90 Base) MCG/ACT inhaler TAKE 2 PUFFS BY MOUTH EVERY 6 HOURS AS NEEDED FOR WHEEZE OR SHORTNESS OF BREATH*NOT COVERED* (Patient taking differently: Inhale 2 puffs into the lungs every 6 (six) hours as needed for wheezing or shortness of breath. ) 8.5 g 1  . atorvastatin (LIPITOR) 10 MG tablet Take 1 tablet (10 mg total) by mouth daily. (Patient taking differently: Take 10 mg by mouth daily with supper. ) 90 tablet 3  . carvedilol (COREG) 12.5 MG tablet TAKE 1 TABLET (12.5 MG TOTAL) BY MOUTH 2 (TWO) TIMES DAILY WITH A MEAL. 180 tablet 2  . [employer] NICOTINE TRANSDERMAL SYS 14 MG/24HR patch PLACE 1 PATCH (14 MG TOTAL) ONTO THE SKIN DAILY. 28 patch 0  . diclofenac sodium (VOLTAREN) 1 % GEL Apply 4 g topically 4 (four) times daily. (Patient taking differently: Apply 4 g topically 4 (four) times daily as needed (pain). ) 4 Tube 3  . dicyclomine (BENTYL) 10 MG capsule TAKE 1 CAPSULE (10 MG TOTAL) BY MOUTH  4 (FOUR) TIMES DAILY - BEFORE MEALS AND AT BEDTIME. 90 capsule 0  . dronedarone (MULTAQ) 400 MG tablet Take 1 tablet (400 mg total) by mouth daily. (Patient taking differently: Take 400 mg by mouth daily after breakfast. ) 90 tablet 3  . fenofibrate (TRICOR) 48 MG tablet TAKE 1 TABLET BY MOUTH EVERY DAY (Patient taking differently: Take 48 mg by mouth daily after lunch. ) 90 tablet 3  . glucose blood (ONETOUCH VERIO) test strip Use to test 3-4 times daily. 300 strip 4   . levothyroxine (SYNTHROID) 112 MCG tablet TAKE 1 TABLET (112 MCG TOTAL) BY MOUTH DAILY BEFORE BREAKFAST. 90 tablet 2  . linagliptin (TRADJENTA) 5 MG TABS tablet TAKE 1 TABLET (5 MG TOTAL) DAILY BY MOUTH. (Patient taking differently: Take 5 mg by mouth daily after breakfast. ) 90 tablet 1  . losartan (COZAAR) 50 MG tablet Take 2 tablets by mouth daily. (Patient taking differently: Take 100 mg by mouth daily. ) 180 tablet 1  . pantoprazole (PROTONIX) 40 MG tablet Take 1 tablet (40 mg total) by mouth daily. (Patient taking differently: Take 40 mg by mouth at bedtime. ) 30 tablet 3  . potassium chloride SA (KLOR-CON) 20 MEQ tablet 1 tab bid x 2 then 1 tab daily (Patient taking differently: Take 20 mEq by mouth at bedtime. ) 30 tablet 3  . XARELTO 15 MG TABS tablet TAKE 1 TABLET BY MOUTH DAILY WITH SUPPER. (Patient taking differently: Take 15 mg by mouth daily with supper. ) 30 tablet 3   No current facility-administered medications on file prior to visit.     Past Medical History:  Diagnosis Date  . CAD (coronary artery disease), native coronary artery    PTCA of distal right coronary artery 1997 Cypher stents to circumflex 2005 Cardiac cath in 2011 with patent stent to circumflex and moderate disease elsewhere treated medically   . Cardiac pacemaker in situ 12/24/2015   Original implant reportedly in 1991 for tachybradycardia syndrome, generator change in 1999, 2004 and evidently again in 2016 in [place]   . COPD (chronic obstructive pulmonary disease) ([place])   . Diabetes mellitus without complication ([place])   . Hypertension   . Hypothyroidism 03/23/2010  . Pacemaker   . Paroxysmal atrial fibrillation ([place]) 03/23/2010   CHA2DS2VASC score 5    No Known Allergies  Social History   Socioeconomic History  . Marital status: Widowed    Spouse name: Not on file  . Number of children: Not on file  . Years of education: Not on file  . Highest education level: Not on file  Occupational History   . Not on file  Tobacco Use  . Smoking status: Current Every Day Smoker    Packs/day: 0.30  . Smokeless tobacco: Never Used  Substance and Sexual Activity  . Alcohol use: No    Alcohol/week: 0.0 standard drinks  . Drug use: No  . Sexual activity: Not on file  Other Topics Concern  . Not on file  Social History Narrative  . Not on file   Social Determinants of Health   Financial Resource Strain:   . Difficulty of Paying Living Expenses:   Food Insecurity:   . Worried About Charity fundraiser in the Last Year:   . Arboriculturist in the Last Year:   Transportation Needs:   . Film/video editor (Medical):   Marland Kitchen Lack of Transportation (Non-Medical):   Physical Activity:   . Days of Exercise per Week:   .  Minutes of Exercise per Session:   Stress:   . Feeling of Stress :   Social Connections:   . Frequency of Communication with Friends and Family:   . Frequency of Social Gatherings with Friends and Family:   . Attends Religious Services:   . Active Member of Clubs or Organizations:   . Attends Archivist Meetings:   Marland Kitchen Marital Status:     Vitals:   12/30/19 0914  BP: 128/64  Pulse: 64  Resp: 16  Temp: (!) 95.8 F (35.4 C)  SpO2: 96%   Wt Readings from Last 3 Encounters:     12/30/19 114 lb 6.4 oz (51.9 kg)  12/27/19 117 lb (53.1 kg)   Body mass index is 20.92 kg/m.  Physical Exam  Nursing note and vitals reviewed. Constitutional: She is oriented to person, place, and time. She appears well-developed. No distress.  HENT:  Head: Normocephalic and atraumatic.  Mouth/Throat: Mucous membranes are normal. She has dentures.  Eyes: Conjunctivae are normal.  Cardiovascular: Normal rate and regular rhythm.  No murmur heard. Pulses:      Dorsalis pedis pulses are 2+ on the right side and 2+ on the left side.  Respiratory: Effort normal. No respiratory distress. She has decreased breath sounds. She has no wheezes. She has no rhonchi. She has no rales.   GI: Soft. She exhibits no mass. There is no hepatomegaly. There is no abdominal tenderness.  Musculoskeletal:        General: No edema.  Lymphadenopathy:    She has no cervical adenopathy.  Neurological: She is alert and oriented to person, place, and time. She has normal strength. No cranial nerve deficit. Gait normal.  Skin: Skin is warm. No rash noted. No erythema.  Psychiatric: She has a normal mood and affect.  Well groomed, good eye contact.    ASSESSMENT AND PLAN:  Ms.Alica was seen today for hospitalization follow-up and abdominal pain.  Diagnoses and all orders for this visit:  Chronic bronchitis, unspecified chronic bronchitis type ([place]) Strongly recommend starting Wixela 250-50 mcg bid , swish mouth after use. Continue Albuterol inh 2 puff q 6 hours as needed. We discussed the benefits of smoking cessation.  Her daughter is going to start Nicotine patches. We discussed some side effects.  Weight loss, abnormal 4 Lb wt wt loss since her last visit. Recommend increase calory intake, boots or ensure for snack x 2.  Hypokalemia Most likely having labs done during oncologist's office. Continue KCL 20 meq daily.  Hypertension with heart disease BP adequately controlled. No changes in current management. Continue low salt diet.  Coronary artery disease involving native coronary artery of native heart with angina pectoris (HCC) Asymptomatic. Smoking cessation recommended. Following with cardiologist.  -     nitroGLYCERIN (NITROSTAT) 0.4 MG SL tablet; Place 1 tablet (0.4 mg total) under the tongue every 5 (five) minutes x 3 doses as needed for chest pain (Max 3 doses within 15 min. Call 911). (Patient not taking: Reported on 01/01/2020)  Malignant neoplasm of lung, unspecified laterality, unspecified part of lung [facility]) She already has an appt with oncologist later this week.  Abdominal pain, unspecified abdominal location + Diarrhea. Bentyl has helped,so  continue. Low residue diet may also help.   Return in about 3 months (around 03/30/2020).   Liora Myles G. [name], MD  [facility]. [place] office.

## 2019-12-30 NOTE — Patient Instructions (Signed)
A few things to remember from today's visit:   Please try to use the steroid inhaler. Keep appt with oncologist. Try to quit smoking. Continue Bentyl. Ensure or boost 2 times daily and try to eat 3 meals daily.   Please be sure medication list is accurate. If a new problem present, please set up appointment sooner than planned today.

## 2019-12-31 ENCOUNTER — Telehealth: Payer: Self-pay | Admitting: *Deleted

## 2019-12-31 ENCOUNTER — Other Ambulatory Visit: Payer: Medicare Other

## 2019-12-31 ENCOUNTER — Ambulatory Visit: Payer: Medicare Other | Admitting: Hematology & Oncology

## 2019-12-31 DIAGNOSIS — R918 Other nonspecific abnormal finding of lung field: Secondary | ICD-10-CM

## 2019-12-31 NOTE — Telephone Encounter (Signed)
Per Dr. Julien Nordmann I called patient and schedule her to be seen tomorrow.  I spoke with patient and daughter.  Patient agreed to be seen tomorrow.  I updated on time and place of appt.

## 2020-01-01 ENCOUNTER — Other Ambulatory Visit: Payer: Self-pay

## 2020-01-01 ENCOUNTER — Other Ambulatory Visit: Payer: Self-pay | Admitting: *Deleted

## 2020-01-01 ENCOUNTER — Inpatient Hospital Stay: Payer: Medicare Other | Attending: Internal Medicine

## 2020-01-01 ENCOUNTER — Inpatient Hospital Stay (HOSPITAL_BASED_OUTPATIENT_CLINIC_OR_DEPARTMENT_OTHER): Payer: Medicare Other | Admitting: Internal Medicine

## 2020-01-01 ENCOUNTER — Encounter: Payer: Self-pay | Admitting: Internal Medicine

## 2020-01-01 VITALS — BP 162/62 | HR 71 | Temp 98.2°F | Resp 20 | Ht 62.0 in | Wt 116.1 lb

## 2020-01-01 DIAGNOSIS — I2510 Atherosclerotic heart disease of native coronary artery without angina pectoris: Secondary | ICD-10-CM

## 2020-01-01 DIAGNOSIS — C3411 Malignant neoplasm of upper lobe, right bronchus or lung: Secondary | ICD-10-CM | POA: Diagnosis not present

## 2020-01-01 DIAGNOSIS — J449 Chronic obstructive pulmonary disease, unspecified: Secondary | ICD-10-CM

## 2020-01-01 DIAGNOSIS — F17200 Nicotine dependence, unspecified, uncomplicated: Secondary | ICD-10-CM

## 2020-01-01 DIAGNOSIS — E039 Hypothyroidism, unspecified: Secondary | ICD-10-CM | POA: Diagnosis not present

## 2020-01-01 DIAGNOSIS — R590 Localized enlarged lymph nodes: Secondary | ICD-10-CM | POA: Insufficient documentation

## 2020-01-01 DIAGNOSIS — C7951 Secondary malignant neoplasm of bone: Secondary | ICD-10-CM | POA: Diagnosis not present

## 2020-01-01 DIAGNOSIS — R918 Other nonspecific abnormal finding of lung field: Secondary | ICD-10-CM | POA: Insufficient documentation

## 2020-01-01 DIAGNOSIS — I10 Essential (primary) hypertension: Secondary | ICD-10-CM | POA: Diagnosis not present

## 2020-01-01 DIAGNOSIS — I480 Paroxysmal atrial fibrillation: Secondary | ICD-10-CM

## 2020-01-01 DIAGNOSIS — C3490 Malignant neoplasm of unspecified part of unspecified bronchus or lung: Secondary | ICD-10-CM

## 2020-01-01 DIAGNOSIS — E119 Type 2 diabetes mellitus without complications: Secondary | ICD-10-CM | POA: Diagnosis not present

## 2020-01-01 LAB — CMP (CANCER CENTER ONLY)
ALT: 10 U/L (ref 0–44)
AST: 13 U/L — ABNORMAL LOW (ref 15–41)
Albumin: 3 g/dL — ABNORMAL LOW (ref 3.5–5.0)
Alkaline Phosphatase: 61 U/L (ref 38–126)
Anion gap: 10 (ref 5–15)
BUN: 12 mg/dL (ref 8–23)
CO2: 25 mmol/L (ref 22–32)
Calcium: 9.1 mg/dL (ref 8.9–10.3)
Chloride: 107 mmol/L (ref 98–111)
Creatinine: 0.75 mg/dL (ref 0.44–1.00)
GFR, Est AFR Am: >60 mL/min (ref >60)
GFR, Estimated: >60 mL/min (ref >60)
Glucose, Bld: 94 mg/dL (ref 70–99)
Potassium: 4.2 mmol/L (ref 3.5–5.1)
Sodium: 142 mmol/L (ref 135–145)
Total Bilirubin: 0.3 mg/dL (ref 0.3–1.2)
Total Protein: 7 g/dL (ref 6.5–8.1)

## 2020-01-01 LAB — CBC WITH DIFFERENTIAL (CANCER CENTER ONLY)
Abs Immature Granulocytes: 0.01 10*3/uL (ref 0.00–0.07)
Basophils Absolute: 0 10*3/uL (ref 0.0–0.1)
Basophils Relative: 1 %
Eosinophils Absolute: 0.1 10*3/uL (ref 0.0–0.5)
Eosinophils Relative: 2 %
HCT: 34.1 % — ABNORMAL LOW (ref 36.0–46.0)
Hemoglobin: 10.6 g/dL — ABNORMAL LOW (ref 12.0–15.0)
Immature Granulocytes: 0 %
Lymphocytes Relative: 33 %
Lymphs Abs: 1.7 10*3/uL (ref 0.7–4.0)
MCH: 27.9 pg (ref 26.0–34.0)
MCHC: 31.1 g/dL (ref 30.0–36.0)
MCV: 89.7 fL (ref 80.0–100.0)
Monocytes Absolute: 0.6 10*3/uL (ref 0.1–1.0)
Monocytes Relative: 12 %
Neutro Abs: 2.8 10*3/uL (ref 1.7–7.7)
Neutrophils Relative %: 52 %
Platelet Count: 203 10*3/uL (ref 150–400)
RBC: 3.8 MIL/uL — ABNORMAL LOW (ref 3.87–5.11)
RDW: 15.2 % (ref 11.5–15.5)
WBC Count: 5.2 10*3/uL (ref 4.0–10.5)
nRBC: 0 % (ref 0.0–0.2)

## 2020-01-01 NOTE — Progress Notes (Signed)
The proposed treatment discussed in cancer conference 12/31/19 is for discussion purpose only and is not a binding recommendation.  The patient was not physically examined nor present for their treatment options.  Therefore, final treatment plans cannot be decided.

## 2020-01-01 NOTE — Progress Notes (Signed)
[name] [phone]   [phone]  CONSULT NOTE  REFERRING PHYSICIAN: Dr. [name]  REASON FOR CONSULTATION:  85 years old African-American female with highly suspicious lung cancer.  HPI [name] is a 85 y.o. female with past medical history significant for multiple medical problems including history of hypertension, diabetes mellitus, COPD, coronary artery disease, dyslipidemia, paroxysmal atrial fibrillation, hypothyroidism as well as status post cardiac pacemaker placement and long history of smoking.  The patient presented to the emergency department complaining of epigastric pain as well as chest pain with radiation to the back and indigestion.  During her evaluation she had CT angiogram of the chest, abdomen and pelvis performed on December 27, 2019 and that showed left lower lobe lung mass measuring 2.1 x 1.7 cm.  There was also right upper lobe mass measuring 3.1 x 2.4 cm.  The scan also showed diffuse mediastinal lymphadenopathy including 3.0 cm subcarinal lymph node, 2.3 cm precarinal node, 1.  8 cm prevascular lymph node, smaller pretracheal nodes were also seen.  There was also a 1.8 cm right hilar lymph node and 1.1 cm left hilar lymph node. The patient was referred to me today for evaluation and recommendation regarding this abnormality. When seen today she continues to complain of pain between the shoulder blades with radiation to the front in addition to indigestion.  She also has shortness of breath secondary to COPD and cough productive of whitish sputum.  She lost around 20 pounds in the last few weeks.  She denied having any nausea, vomiting, abdominal pain or constipation.  She has some visual changes secondary to cataract and no headaches. Family history significant for mother and father with heart disease.  She has a brother who had liver cirrhosis. The patient is a widow and had 7 children, 4 living.  She was accompanied today by  her daughter [name].  The patient worked in several jobs.  She had a history of smoking around 1 pack/day for 66 years and unfortunately she continues to smoke few cigarettes every day.  She has no history of alcohol or drug abuse.  HPI  Past Medical History:  Diagnosis Date  . CAD (coronary artery disease), native coronary artery    PTCA of distal right coronary artery 1997 Cypher stents to circumflex 2005 Cardiac cath in 2011 with patent stent to circumflex and moderate disease elsewhere treated medically   . Cardiac pacemaker in situ 12/24/2015   Original implant reportedly in 1991 for tachybradycardia syndrome, generator change in 1999, 2004 and evidently again in 2016 in [place]   . COPD (chronic obstructive pulmonary disease) ([place])   . Diabetes mellitus without complication ([place])   . Hypertension   . Hypothyroidism 03/23/2010  . Pacemaker   . Paroxysmal atrial fibrillation ([place]) 03/23/2010   CHA2DS2VASC score 5     Past Surgical History:  Procedure Laterality Date  . CARDIAC CATHETERIZATION N/A 12/26/2015   Procedure: Left Heart Cath and Coronary Angiography;  Surgeon: Peter M [name], MD;  Location: [facility];  Service: Cardiovascular;  Laterality: N/A;  . CARDIOVERSION  05/2015  . CHOLECYSTECTOMY  2012  . CORONARY ANGIOPLASTY WITH STENT PLACEMENT  1990  . HERNIA REPAIR    . PACEMAKER GENERATOR CHANGE  [id number], 2016  . PACEMAKER INSERTION  1991    Family History  Problem Relation Age of Onset  . Heart disease Father   . Heart disease Mother   . Heart attack Mother   .  Cirrhosis Brother   . Colon cancer Neg Hx   . Stomach cancer Neg Hx     Social History Social History   Tobacco Use  . Smoking status: Current Every Day Smoker    Packs/day: 0.30  . Smokeless tobacco: Never Used  Substance Use Topics  . Alcohol use: No    Alcohol/week: 0.0 standard drinks  . Drug use: No    No Known Allergies  Current Outpatient Medications  Medication Sig  Dispense Refill  . acetaminophen (TYLENOL) 500 MG tablet Take 500 mg by mouth every 6 (six) hours as needed for headache.    . albuterol (VENTOLIN HFA) 108 (90 Base) MCG/ACT inhaler TAKE 2 PUFFS BY MOUTH EVERY 6 HOURS AS NEEDED FOR WHEEZE OR SHORTNESS OF BREATH*NOT COVERED* (Patient taking differently: Inhale 2 puffs into the lungs every 6 (six) hours as needed for wheezing or shortness of breath. ) 8.5 g 1  . atorvastatin (LIPITOR) 10 MG tablet Take 1 tablet (10 mg total) by mouth daily. (Patient taking differently: Take 10 mg by mouth daily with supper. ) 90 tablet 3  . carvedilol (COREG) 12.5 MG tablet TAKE 1 TABLET (12.5 MG TOTAL) BY MOUTH 2 (TWO) TIMES DAILY WITH A MEAL. 180 tablet 2  . [employer] NICOTINE TRANSDERMAL SYS 14 MG/24HR patch PLACE 1 PATCH (14 MG TOTAL) ONTO THE SKIN DAILY. 28 patch 0  . diclofenac sodium (VOLTAREN) 1 % GEL Apply 4 g topically 4 (four) times daily. (Patient taking differently: Apply 4 g topically 4 (four) times daily as needed (pain). ) 4 Tube 3  . dicyclomine (BENTYL) 10 MG capsule TAKE 1 CAPSULE (10 MG TOTAL) BY MOUTH 4 (FOUR) TIMES DAILY - BEFORE MEALS AND AT BEDTIME. 90 capsule 0  . dronedarone (MULTAQ) 400 MG tablet Take 1 tablet (400 mg total) by mouth daily. (Patient taking differently: Take 400 mg by mouth daily after breakfast. ) 90 tablet 3  . fenofibrate (TRICOR) 48 MG tablet TAKE 1 TABLET BY MOUTH EVERY DAY (Patient taking differently: Take 48 mg by mouth daily after lunch. ) 90 tablet 3  . glucose blood (ONETOUCH VERIO) test strip Use to test 3-4 times daily. 300 strip 4  . levothyroxine (SYNTHROID) 112 MCG tablet TAKE 1 TABLET (112 MCG TOTAL) BY MOUTH DAILY BEFORE BREAKFAST. 90 tablet 2  . linagliptin (TRADJENTA) 5 MG TABS tablet TAKE 1 TABLET (5 MG TOTAL) DAILY BY MOUTH. (Patient taking differently: Take 5 mg by mouth daily after breakfast. ) 90 tablet 1  . losartan (COZAAR) 50 MG tablet Take 2 tablets by mouth daily. (Patient taking differently: Take 100 mg by  mouth daily. ) 180 tablet 1  . nitroGLYCERIN (NITROSTAT) 0.4 MG SL tablet Place 1 tablet (0.4 mg total) under the tongue every 5 (five) minutes x 3 doses as needed for chest pain (Max 3 doses within 15 min. Call 911). 10 tablet 0  . pantoprazole (PROTONIX) 40 MG tablet Take 1 tablet (40 mg total) by mouth daily. (Patient taking differently: Take 40 mg by mouth at bedtime. ) 30 tablet 3  . potassium chloride SA (KLOR-CON) 20 MEQ tablet 1 tab bid x 2 then 1 tab daily (Patient taking differently: Take 20 mEq by mouth at bedtime. ) 30 tablet 3  . XARELTO 15 MG TABS tablet TAKE 1 TABLET BY MOUTH DAILY WITH SUPPER. (Patient taking differently: Take 15 mg by mouth daily with supper. ) 30 tablet 3   No current facility-administered medications for this visit.    Review  of Systems  Constitutional: positive for anorexia, fatigue and weight loss Eyes: negative Ears, nose, mouth, throat, and face: negative Respiratory: positive for cough, dyspnea on exertion and pleurisy/chest pain Cardiovascular: negative Gastrointestinal: positive for dyspepsia Genitourinary:negative Integument/breast: negative Hematologic/lymphatic: negative Musculoskeletal:negative Neurological: negative Behavioral/Psych: negative Endocrine: negative Allergic/Immunologic: negative  Physical Exam  RJJ:OACZY, healthy, no distress, well nourished and well developed SKIN: skin color, texture, turgor are normal, no rashes or significant lesions HEAD: Normocephalic, No masses, lesions, tenderness or abnormalities EYES: normal, PERRLA, Conjunctiva are pink and non-injected EARS: External ears normal, Canals clear OROPHARYNX:no exudate, no erythema and lips, buccal mucosa, and tongue normal  NECK: supple, no adenopathy, no JVD LYMPH:  no palpable lymphadenopathy, no hepatosplenomegaly BREAST:not examined LUNGS: clear to auscultation , and palpation HEART: regular rate & rhythm, no murmurs and no gallops ABDOMEN:abdomen soft,  non-tender, normal bowel sounds and no masses or organomegaly BACK: No CVA tenderness, Range of motion is normal EXTREMITIES:no joint deformities, effusion, or inflammation, no edema  NEURO: alert & oriented x 3 with fluent speech, no focal motor/sensory deficits  PERFORMANCE STATUS: ECOG 1  LABORATORY DATA: Lab Results  Component Value Date   WBC 5.2 01/01/2020   HGB 10.6 (L) 01/01/2020   HCT 34.1 (L) 01/01/2020   MCV 89.7 01/01/2020   PLT 203 01/01/2020      Chemistry      Component Value Date/Time   NA 140 12/27/2019 1551   K 3.4 (L) 12/27/2019 1551   CL 106 12/27/2019 1551   CO2 27 12/27/2019 1551   BUN 5 (L) 12/27/2019 1551   CREATININE 0.54 12/27/2019 1551      Component Value Date/Time   CALCIUM 9.0 12/27/2019 1551   ALKPHOS 52 12/27/2019 1551   AST 15 12/27/2019 1551   ALT 12 12/27/2019 1551   BILITOT 0.5 12/27/2019 1551       RADIOGRAPHIC STUDIES: DG Abdomen Acute W/Chest  Result Date: 12/27/2019 CLINICAL DATA:  Epigastric abdominal pain radiating to back, diarrhea EXAM: DG ABDOMEN ACUTE W/ 1V CHEST COMPARISON:  11/28/2016 FINDINGS: Supine and upright frontal views of the abdomen as well as an upright frontal view of the chest are obtained. The cardiac silhouette is unremarkable. There is mild central vascular congestion and background emphysema. No airspace disease, effusion, or pneumothorax. Bowel gas pattern is unremarkable without obstruction or ileus. Scattered gas fluid levels extending through the transverse colon are nonspecific, and could relate to the patient's complaint of diarrhea. No masses or abnormal calcifications. No free gas in the greater peritoneal sac. Postsurgical changes right hip. IMPRESSION: 1. Colonic gas fluid levels are nonspecific, but could relate to the patient's diarrhea. 2. No bowel obstruction or ileus. 3. Background emphysema, no acute intrathoracic process. Electronically Signed   By: Randa Ngo M.D.   On: 12/27/2019 20:02    CT Angio Chest/Abd/Pel for Dissection W and/or Wo Contrast  Result Date: 12/28/2019 CLINICAL DATA:  Chest and abdominal pain EXAM: CT ANGIOGRAPHY CHEST, ABDOMEN AND PELVIS TECHNIQUE: Multidetector CT imaging through the chest, abdomen and pelvis was performed using the standard protocol during bolus administration of intravenous contrast. Multiplanar reconstructed images and MIPs were obtained and reviewed to evaluate the vascular anatomy. CONTRAST:  152mL OMNIPAQUE IOHEXOL 350 MG/ML SOLN COMPARISON:  11/28/2016 CT abdomen and pelvis and plain film exam from earlier in the same day. FINDINGS: CTA CHEST FINDINGS Cardiovascular: Thoracic aorta and its branches demonstrate atherosclerotic calcifications with a normal branching pattern. No aneurysmal dilatation or dissection is seen. Pulmonary artery shows a  normal branching pattern. No filling defects are identified to suggest pulmonary embolism. Coronary calcifications are seen. Mild cardiac enlargement is noted. Pacing device is noted as well. Considerable venous collateralization is noted along the right chest wall likely related to the prior pacing device. Mediastinum/Nodes: Thoracic inlet is within normal limits. The esophagus as visualized is unremarkable. Diffuse lymphadenopathy is identified within the mediastinum. A 3.0 cm subcarinal node is noted. A precarinal node which measures at least 2.3 cm is seen and a pre-vascular lymph node mass which measures 1.8 cm is noted. Smaller pretracheal nodes are seen. Additionally there is a 18 mm right hilar lymph node and 11 mm left hilar lymph node identified. Some smaller scattered hilar nodes are noted as well. Lungs/Pleura: Emphysematous changes are seen. Additionally there is a mass lesion in the left lower lobe which measures approximately 2.1 by 1.7 cm consistent with a primary pulmonary neoplasm till proven otherwise. In the right upper lobe there is a somewhat spiculated lesion identified best seen on  image number 49 of series 8 along the lateral aspect of the mediastinum projecting in the right upper lobe. This measures 3.1 x 2.4 cm and is also consistent with pulmonary neoplasm till proven otherwise. Moderate right-sided pleural effusion is seen as well as some right lower lobe atelectasis. Musculoskeletal: Degenerative changes of the thoracic spine are seen. No acute rib abnormality is noted. Review of the MIP images confirms the above findings. CTA ABDOMEN AND PELVIS FINDINGS VASCULAR Aorta: Abdominal aorta demonstrates atherosclerotic calcifications. No evidence of aneurysmal dilatation or dissection is seen. Celiac: Atherosclerotic calcifications are noted although no focal stenosis of the celiac axis is seen. SMA: Atherosclerotic calcifications are noted without focal stenosis. Renals: Heavy atherosclerotic calcifications are seen within single renal arteries bilaterally. IMA: Patent without evidence of aneurysm, dissection, vasculitis or significant stenosis. Iliacs: Diffuse vascular calcifications are seen without aneurysmal dilatation. Veins: No specific venous abnormality is noted. Review of the MIP images confirms the above findings. NON-VASCULAR Hepatobiliary: Gallbladder has been surgically removed. Liver appears within normal limits. Pancreas: Unremarkable. No pancreatic ductal dilatation or surrounding inflammatory changes. Spleen: Normal in size without focal abnormality. Adrenals/Urinary Tract: Adrenal glands are within normal limits. Normal enhancement of the kidneys is seen bilaterally. No renal calculi is are seen. Stable left renal cyst is noted. The bladder is well distended. Stomach/Bowel: No obstructive or inflammatory changes of the colon are seen. The appendix is within normal limits. Small bowel and stomach appear unremarkable. Lymphatic: No significant lymphadenopathy is seen. Reproductive: Uterus and bilateral adnexa are unremarkable. Other: No abdominal wall hernia or abnormality.  No abdominopelvic ascites. Musculoskeletal: Postsurgical changes in the proximal right femur are seen. Degenerative changes of the lumbar spine are noted. Degenerative changes about the sacroiliac joints are seen as well. No lytic or sclerotic lesions are seen. Review of the MIP images confirms the above findings. IMPRESSION: No evidence of dissection or aneurysmal dilatation is identified. No evidence of pulmonary emboli. Bilateral lung masses in the right upper and left lower lobe with associated hilar and mediastinal adenopathy consistent with neoplasm. Right-sided pleural effusion is noted as well. Further workup by means of tissue sampling and PET-CT would be helpful. No acute abnormality in the abdomen is seen. Aortic Atherosclerosis (ICD10-I70.0) and Emphysema (ICD10-J43.9). Electronically Signed   By: Inez Catalina M.D.   On: 12/28/2019 00:09    ASSESSMENT: This is a very pleasant 85 years old African-American female with highly suspicious metastatic lung cancer probably small cell carcinoma especially with the  significant mediastinal involvement and bilateral lung nodules.   PLAN: I had a lengthy discussion with the patient and her daughter today about her current condition and further investigation to confirm her diagnosis and potential treatment options. I recommended for the patient to complete the staging work-up by ordering a PET scan as well as CT scan of the head with and without contrast.  We will not be able to do MRI of the brain because of the pacemaker. I also discussed with the patient referral to either cardiothoracic surgery or pulmonary medicine for consideration of bronchoscopy with endobronchial ultrasound and biopsy. I will arrange for the patient to come back for follow-up visit in less than 2 weeks for evaluation and more detailed discussion of her treatment options based on the final pathology and staging work-up. I may also consider referring the patient to radiation oncology  for consideration of palliative radiotherapy to the obstructive lung mass. For smoking cessation, we strongly encouraged the patient to quit smoking and she is working on smoking cessation plan. The patient was advised to call immediately if she has any other concerning symptoms in the interval. The patient voices understanding of current disease status and treatment options and is in agreement with the current care plan.  All questions were answered. The patient knows to call the clinic with any problems, questions or concerns. We can certainly see the patient much sooner if necessary.  Thank you so much for allowing me to participate in the care of [name]. I will continue to follow up the patient with you and assist in her care.  The total time spent in the appointment was 70 minutes.  Disclaimer: This note was dictated with voice recognition software. Similar sounding words can inadvertently be transcribed and may not be corrected upon review.   Eilleen Kempf January 01, 2020, 9:33 AM

## 2020-01-04 ENCOUNTER — Other Ambulatory Visit: Payer: Self-pay

## 2020-01-04 ENCOUNTER — Other Ambulatory Visit: Payer: Self-pay | Admitting: Internal Medicine

## 2020-01-04 ENCOUNTER — Encounter (HOSPITAL_COMMUNITY): Payer: Self-pay

## 2020-01-04 ENCOUNTER — Inpatient Hospital Stay (HOSPITAL_COMMUNITY)
Admission: EM | Admit: 2020-01-04 | Discharge: 2020-01-08 | DRG: 181 | Disposition: A | Discharge status: Home / Self Care | Payer: Medicare Other | Attending: Internal Medicine | Admitting: Internal Medicine

## 2020-01-04 DIAGNOSIS — Z8249 Family history of ischemic heart disease and other diseases of the circulatory system: Secondary | ICD-10-CM

## 2020-01-04 DIAGNOSIS — E1169 Type 2 diabetes mellitus with other specified complication: Secondary | ICD-10-CM | POA: Diagnosis not present

## 2020-01-04 DIAGNOSIS — I959 Hypotension, unspecified: Secondary | ICD-10-CM | POA: Diagnosis not present

## 2020-01-04 DIAGNOSIS — I482 Chronic atrial fibrillation, unspecified: Secondary | ICD-10-CM | POA: Diagnosis not present

## 2020-01-04 DIAGNOSIS — F17210 Nicotine dependence, cigarettes, uncomplicated: Secondary | ICD-10-CM | POA: Diagnosis present

## 2020-01-04 DIAGNOSIS — I480 Paroxysmal atrial fibrillation: Secondary | ICD-10-CM | POA: Diagnosis not present

## 2020-01-04 DIAGNOSIS — Z8616 Personal history of COVID-19: Secondary | ICD-10-CM | POA: Diagnosis not present

## 2020-01-04 DIAGNOSIS — I10 Essential (primary) hypertension: Secondary | ICD-10-CM | POA: Diagnosis present

## 2020-01-04 DIAGNOSIS — C3490 Malignant neoplasm of unspecified part of unspecified bronchus or lung: Secondary | ICD-10-CM | POA: Diagnosis present

## 2020-01-04 DIAGNOSIS — Z955 Presence of coronary angioplasty implant and graft: Secondary | ICD-10-CM | POA: Diagnosis not present

## 2020-01-04 DIAGNOSIS — F17200 Nicotine dependence, unspecified, uncomplicated: Secondary | ICD-10-CM | POA: Diagnosis present

## 2020-01-04 DIAGNOSIS — E860 Dehydration: Secondary | ICD-10-CM | POA: Diagnosis not present

## 2020-01-04 DIAGNOSIS — E11649 Type 2 diabetes mellitus with hypoglycemia without coma: Secondary | ICD-10-CM | POA: Diagnosis not present

## 2020-01-04 DIAGNOSIS — Z79899 Other long term (current) drug therapy: Secondary | ICD-10-CM

## 2020-01-04 DIAGNOSIS — K529 Noninfective gastroenteritis and colitis, unspecified: Secondary | ICD-10-CM | POA: Diagnosis present

## 2020-01-04 DIAGNOSIS — E039 Hypothyroidism, unspecified: Secondary | ICD-10-CM | POA: Diagnosis not present

## 2020-01-04 DIAGNOSIS — C3401 Malignant neoplasm of right main bronchus: Secondary | ICD-10-CM | POA: Diagnosis not present

## 2020-01-04 DIAGNOSIS — K30 Functional dyspepsia: Secondary | ICD-10-CM | POA: Diagnosis present

## 2020-01-04 DIAGNOSIS — I351 Nonrheumatic aortic (valve) insufficiency: Secondary | ICD-10-CM | POA: Diagnosis not present

## 2020-01-04 DIAGNOSIS — I361 Nonrheumatic tricuspid (valve) insufficiency: Secondary | ICD-10-CM | POA: Diagnosis not present

## 2020-01-04 DIAGNOSIS — W19XXXA Unspecified fall, initial encounter: Secondary | ICD-10-CM | POA: Diagnosis present

## 2020-01-04 DIAGNOSIS — R634 Abnormal weight loss: Secondary | ICD-10-CM | POA: Diagnosis present

## 2020-01-04 DIAGNOSIS — Z9049 Acquired absence of other specified parts of digestive tract: Secondary | ICD-10-CM

## 2020-01-04 DIAGNOSIS — J439 Emphysema, unspecified: Secondary | ICD-10-CM | POA: Diagnosis present

## 2020-01-04 DIAGNOSIS — R079 Chest pain, unspecified: Secondary | ICD-10-CM

## 2020-01-04 DIAGNOSIS — I495 Sick sinus syndrome: Secondary | ICD-10-CM | POA: Diagnosis not present

## 2020-01-04 DIAGNOSIS — E785 Hyperlipidemia, unspecified: Secondary | ICD-10-CM | POA: Diagnosis not present

## 2020-01-04 DIAGNOSIS — R630 Anorexia: Secondary | ICD-10-CM | POA: Diagnosis present

## 2020-01-04 DIAGNOSIS — J90 Pleural effusion, not elsewhere classified: Secondary | ICD-10-CM | POA: Diagnosis present

## 2020-01-04 DIAGNOSIS — R918 Other nonspecific abnormal finding of lung field: Secondary | ICD-10-CM

## 2020-01-04 DIAGNOSIS — Z7901 Long term (current) use of anticoagulants: Secondary | ICD-10-CM

## 2020-01-04 DIAGNOSIS — Z20822 Contact with and (suspected) exposure to covid-19: Secondary | ICD-10-CM | POA: Diagnosis not present

## 2020-01-04 DIAGNOSIS — R55 Syncope and collapse: Secondary | ICD-10-CM | POA: Diagnosis not present

## 2020-01-04 DIAGNOSIS — R159 Full incontinence of feces: Secondary | ICD-10-CM | POA: Diagnosis present

## 2020-01-04 DIAGNOSIS — R1310 Dysphagia, unspecified: Secondary | ICD-10-CM | POA: Diagnosis present

## 2020-01-04 DIAGNOSIS — R110 Nausea: Secondary | ICD-10-CM | POA: Diagnosis not present

## 2020-01-04 DIAGNOSIS — I119 Hypertensive heart disease without heart failure: Secondary | ICD-10-CM | POA: Diagnosis present

## 2020-01-04 DIAGNOSIS — I700 Atherosclerosis of aorta: Secondary | ICD-10-CM | POA: Diagnosis present

## 2020-01-04 DIAGNOSIS — I491 Atrial premature depolarization: Secondary | ICD-10-CM | POA: Diagnosis not present

## 2020-01-04 DIAGNOSIS — I2510 Atherosclerotic heart disease of native coronary artery without angina pectoris: Secondary | ICD-10-CM | POA: Diagnosis present

## 2020-01-04 DIAGNOSIS — Z7984 Long term (current) use of oral hypoglycemic drugs: Secondary | ICD-10-CM

## 2020-01-04 DIAGNOSIS — R590 Localized enlarged lymph nodes: Secondary | ICD-10-CM | POA: Diagnosis not present

## 2020-01-04 DIAGNOSIS — Z950 Presence of cardiac pacemaker: Secondary | ICD-10-CM | POA: Diagnosis present

## 2020-01-04 DIAGNOSIS — R197 Diarrhea, unspecified: Secondary | ICD-10-CM | POA: Diagnosis not present

## 2020-01-04 DIAGNOSIS — Z79890 Hormone replacement therapy (postmenopausal): Secondary | ICD-10-CM

## 2020-01-04 DIAGNOSIS — J449 Chronic obstructive pulmonary disease, unspecified: Secondary | ICD-10-CM | POA: Diagnosis present

## 2020-01-04 DIAGNOSIS — Z743 Need for continuous supervision: Secondary | ICD-10-CM | POA: Diagnosis not present

## 2020-01-04 HISTORY — DX: Malignant (primary) neoplasm, unspecified: C80.1

## 2020-01-04 LAB — BASIC METABOLIC PANEL
Anion gap: 6 (ref 5–15)
BUN: 16 mg/dL (ref 8–23)
CO2: 25 mmol/L (ref 22–32)
Calcium: 9 mg/dL (ref 8.9–10.3)
Chloride: 105 mmol/L (ref 98–111)
Creatinine, Ser: 0.89 mg/dL (ref 0.44–1.00)
GFR calc Af Amer: >60 mL/min (ref >60)
GFR calc non Af Amer: >60 mL/min (ref >60)
Glucose, Bld: 120 mg/dL — ABNORMAL HIGH (ref 70–99)
Potassium: 4.4 mmol/L (ref 3.5–5.1)
Sodium: 136 mmol/L (ref 135–145)

## 2020-01-04 LAB — CBC
HCT: 34.2 % — ABNORMAL LOW (ref 36.0–46.0)
Hemoglobin: 10.4 g/dL — ABNORMAL LOW (ref 12.0–15.0)
MCH: 27.7 pg (ref 26.0–34.0)
MCHC: 30.4 g/dL (ref 30.0–36.0)
MCV: 91.2 fL (ref 80.0–100.0)
Platelets: 213 10*3/uL (ref 150–400)
RBC: 3.75 MIL/uL — ABNORMAL LOW (ref 3.87–5.11)
RDW: 15 % (ref 11.5–15.5)
WBC: 4.5 10*3/uL (ref 4.0–10.5)
nRBC: 0 % (ref 0.0–0.2)

## 2020-01-04 MED ORDER — SODIUM CHLORIDE 0.9% FLUSH: 3.0000 mL | Freq: Once | INTRAVENOUS | Status: DC

## 2020-01-04 NOTE — ED Triage Notes (Signed)
Pt bib [facility] from home w/ c/o near syncope. No LOC or fall. Pt endorses R sided chest pain at time of event. Pt had similar event 2 weeks ago. Pt recently diagnoses w/ lung cancer.

## 2020-01-04 NOTE — ED Notes (Signed)
Patient asked if she could give urine sample now, states not at this time.

## 2020-01-05 ENCOUNTER — Ambulatory Visit: Payer: Medicare Other | Admitting: Internal Medicine

## 2020-01-05 ENCOUNTER — Observation Stay (HOSPITAL_COMMUNITY): Payer: Medicare Other

## 2020-01-05 ENCOUNTER — Emergency Department (HOSPITAL_COMMUNITY): Payer: Medicare Other

## 2020-01-05 ENCOUNTER — Encounter (HOSPITAL_COMMUNITY): Payer: Self-pay | Admitting: Internal Medicine

## 2020-01-05 ENCOUNTER — Other Ambulatory Visit: Payer: Medicare Other

## 2020-01-05 DIAGNOSIS — R590 Localized enlarged lymph nodes: Secondary | ICD-10-CM

## 2020-01-05 DIAGNOSIS — I480 Paroxysmal atrial fibrillation: Secondary | ICD-10-CM | POA: Diagnosis not present

## 2020-01-05 DIAGNOSIS — R55 Syncope and collapse: Secondary | ICD-10-CM | POA: Diagnosis present

## 2020-01-05 DIAGNOSIS — R918 Other nonspecific abnormal finding of lung field: Secondary | ICD-10-CM | POA: Diagnosis not present

## 2020-01-05 LAB — TROPONIN I (HIGH SENSITIVITY)
Troponin I (High Sensitivity): 6 ng/L (ref <18)
Troponin I (High Sensitivity): 7 ng/L (ref <18)

## 2020-01-05 LAB — URINALYSIS, ROUTINE W REFLEX MICROSCOPIC
Bilirubin Urine: NEGATIVE
Glucose, UA: NEGATIVE mg/dL
Hgb urine dipstick: NEGATIVE
Ketones, ur: NEGATIVE mg/dL
Leukocytes,Ua: NEGATIVE
Nitrite: NEGATIVE
Protein, ur: 30 mg/dL — AB
Specific Gravity, Urine: 1.023 (ref 1.005–1.030)
pH: 5 (ref 5.0–8.0)

## 2020-01-05 LAB — CBG MONITORING, ED
Glucose-Capillary: 75 mg/dL (ref 70–99)
Glucose-Capillary: 62 mg/dL — ABNORMAL LOW (ref 70–99)
Glucose-Capillary: 90 mg/dL (ref 70–99)

## 2020-01-05 LAB — GLUCOSE, CAPILLARY
Glucose-Capillary: 77 mg/dL (ref 70–99)
Glucose-Capillary: 81 mg/dL (ref 70–99)

## 2020-01-05 LAB — TSH: TSH: 0.951 u[IU]/mL (ref 0.350–4.500)

## 2020-01-05 MED ORDER — ALBUTEROL SULFATE (2.5 MG/3ML) 0.083% IN NEBU: 3.0000 mL | INHALATION_SOLUTION | Freq: Four times a day (QID) | RESPIRATORY_TRACT | Status: DC | PRN

## 2020-01-05 MED ORDER — DICYCLOMINE HCL 10 MG PO CAPS
10.0000 mg | ORAL_CAPSULE | Freq: Three times a day (TID) | ORAL | Status: DC
  Administered 2020-01-05 – 2020-01-08 (×12): 10 mg via ORAL
  Filled 2020-01-05 (×12): qty 1

## 2020-01-05 MED ORDER — RIVAROXABAN 15 MG PO TABS
15.0000 mg | ORAL_TABLET | Freq: Every day | ORAL | Status: DC
  Filled 2020-01-05: qty 1

## 2020-01-05 MED ORDER — PANTOPRAZOLE SODIUM 40 MG PO TBEC
40.0000 mg | DELAYED_RELEASE_TABLET | Freq: Every day | ORAL | Status: DC
  Administered 2020-01-05 – 2020-01-07 (×3): 40 mg via ORAL
  Filled 2020-01-05 (×3): qty 1

## 2020-01-05 MED ORDER — NICOTINE 14 MG/24HR TD PT24
14.0000 mg | MEDICATED_PATCH | Freq: Every day | TRANSDERMAL | Status: DC
  Administered 2020-01-05 – 2020-01-08 (×4): 14 mg via TRANSDERMAL
  Filled 2020-01-05 (×4): qty 1

## 2020-01-05 MED ORDER — DRONEDARONE HCL 400 MG PO TABS
400.0000 mg | ORAL_TABLET | Freq: Every day | ORAL | Status: DC
  Administered 2020-01-05 – 2020-01-07 (×3): 400 mg via ORAL
  Filled 2020-01-05 (×4): qty 1

## 2020-01-05 MED ORDER — ACETAMINOPHEN 650 MG RE SUPP: 650.0000 mg | Freq: Four times a day (QID) | RECTAL | Status: DC | PRN

## 2020-01-05 MED ORDER — HYDRALAZINE HCL 20 MG/ML IJ SOLN: 5.0000 mg | INTRAMUSCULAR | Status: DC | PRN

## 2020-01-05 MED ORDER — LABETALOL HCL 5 MG/ML IV SOLN: 20.0000 mg | Freq: Once | INTRAVENOUS | Status: DC

## 2020-01-05 MED ORDER — ONDANSETRON HCL 4 MG/2ML IJ SOLN: 4.0000 mg | Freq: Four times a day (QID) | INTRAMUSCULAR | Status: DC | PRN

## 2020-01-05 MED ORDER — ONDANSETRON HCL 4 MG PO TABS: 4.0000 mg | ORAL_TABLET | Freq: Four times a day (QID) | ORAL | Status: DC | PRN

## 2020-01-05 MED ORDER — INSULIN ASPART 100 UNIT/ML ~~LOC~~ SOLN: 0.0000 [IU] | Freq: Three times a day (TID) | SUBCUTANEOUS | Status: DC

## 2020-01-05 MED ORDER — LOSARTAN POTASSIUM 50 MG PO TABS
100.0000 mg | ORAL_TABLET | Freq: Every day | ORAL | Status: DC
  Administered 2020-01-05 – 2020-01-08 (×4): 100 mg via ORAL
  Filled 2020-01-05 (×5): qty 2

## 2020-01-05 MED ORDER — POTASSIUM CHLORIDE CRYS ER 20 MEQ PO TBCR
20.0000 meq | EXTENDED_RELEASE_TABLET | Freq: Every day | ORAL | Status: DC
  Administered 2020-01-05 – 2020-01-07 (×3): 20 meq via ORAL
  Filled 2020-01-05 (×3): qty 1

## 2020-01-05 MED ORDER — ACETAMINOPHEN 325 MG PO TABS
650.0000 mg | ORAL_TABLET | Freq: Four times a day (QID) | ORAL | Status: DC | PRN
  Administered 2020-01-08: 650 mg via ORAL
  Filled 2020-01-05: qty 2

## 2020-01-05 MED ORDER — SODIUM CHLORIDE 0.9% FLUSH
3.0000 mL | Freq: Two times a day (BID) | INTRAVENOUS | Status: DC
  Administered 2020-01-06 – 2020-01-08 (×4): 3 mL via INTRAVENOUS

## 2020-01-05 MED ORDER — ZOLPIDEM TARTRATE 5 MG PO TABS: 5.0000 mg | ORAL_TABLET | Freq: Every evening | ORAL | Status: DC | PRN

## 2020-01-05 MED ORDER — CARVEDILOL 12.5 MG PO TABS
12.5000 mg | ORAL_TABLET | Freq: Two times a day (BID) | ORAL | Status: DC
  Administered 2020-01-05 – 2020-01-07 (×5): 12.5 mg via ORAL
  Filled 2020-01-05 (×6): qty 1

## 2020-01-05 MED ORDER — HYDROCODONE-ACETAMINOPHEN 5-325 MG PO TABS: 1.0000 | ORAL_TABLET | ORAL | Status: DC | PRN

## 2020-01-05 MED ORDER — IPRATROPIUM-ALBUTEROL 0.5-2.5 (3) MG/3ML IN SOLN
3.0000 mL | Freq: Three times a day (TID) | RESPIRATORY_TRACT | Status: DC
  Administered 2020-01-06: 3 mL via RESPIRATORY_TRACT
  Filled 2020-01-05: qty 3

## 2020-01-05 MED ORDER — LACTATED RINGERS IV SOLN: INTRAVENOUS | Status: DC

## 2020-01-05 MED ORDER — IPRATROPIUM-ALBUTEROL 0.5-2.5 (3) MG/3ML IN SOLN
3.0000 mL | Freq: Four times a day (QID) | RESPIRATORY_TRACT | Status: DC
  Administered 2020-01-05: 3 mL via RESPIRATORY_TRACT
  Filled 2020-01-05 (×2): qty 3

## 2020-01-05 MED ORDER — LEVOTHYROXINE SODIUM 112 MCG PO TABS
112.0000 ug | ORAL_TABLET | Freq: Every day | ORAL | Status: DC
  Administered 2020-01-06 – 2020-01-08 (×3): 112 ug via ORAL
  Filled 2020-01-05 (×3): qty 1

## 2020-01-05 MED ORDER — ATORVASTATIN CALCIUM 10 MG PO TABS
10.0000 mg | ORAL_TABLET | Freq: Every day | ORAL | Status: DC
  Administered 2020-01-05 – 2020-01-07 (×3): 10 mg via ORAL
  Filled 2020-01-05 (×3): qty 1

## 2020-01-05 MED ORDER — FENOFIBRATE 54 MG PO TABS
54.0000 mg | ORAL_TABLET | Freq: Every day | ORAL | Status: DC
  Administered 2020-01-05 – 2020-01-08 (×4): 54 mg via ORAL
  Filled 2020-01-05 (×4): qty 1

## 2020-01-05 MED ORDER — MORPHINE SULFATE (PF) 2 MG/ML IV SOLN: 2.0000 mg | INTRAVENOUS | Status: DC | PRN

## 2020-01-05 MED ORDER — IOHEXOL 300 MG/ML  SOLN
100.0000 mL | Freq: Once | INTRAMUSCULAR | Status: AC | PRN
  Administered 2020-01-05: 100 mL via INTRAVENOUS

## 2020-01-05 NOTE — ED Notes (Signed)
Family at bedside. 

## 2020-01-05 NOTE — H&P (Signed)
History and Physical    [name] GHW:[id number] DOB: [birth year] DOA: 01/04/2020  PCP: [name], [name], MD Consultants:  [facility] - oncology; [facility] - cardiology; [facility] - GI Patient coming from:  Home - lives with daughter; NOK: Daughter, Nelson Chimes, [phone]  Chief Complaint:  Near syncope  HPI: [name] is a 85 y.o. female with medical history significant of afib; pacemaker placement; hypothyroidism; HTN; DM; COPD; and CAD presenting with near syncope.  She had a similar ER presentation on 3/28 and CTA for dissection showed RLL mass 2.1 x 1.7 cm and RUL mass 3.1 x 2.4 cm with mediastinal LAD.  She was seen by Dr. Julien Nordmann on 4/2 for "highly suspicious metastatic lung cancer", likely SCC.  She will need a PET and CT head (no MRI due to pacemaker), and referral to CT surgery or pulmonology for biopsy.  She reports that she washed some dishes.  She was done and leaving to make dinner.  She noticed the room get dark and she didn't feel well.  Her husband helped her to her room and the EMT was there.  By the time she got back into the living room, she was sweating.  She did not lose consciousness, "just a little dark."  Denies pain but she did have abdominal pain yesterday in the ER (not before symptoms).  No SOB.  She had a similar episode prior and did pass out at that time - "I could hear them calling me but it felt like they were miles away."  She didn't go to the hospital then.  She was having diarrhea 3-4 times a day at that time.  It hasn't been as bad or watery since going on medication from Dr. [name].  She currently feels a little heaviness in her abdomen.   ED Course:  Chest pressure, tightness with radiation into back.  Similar to prior, new B lung masses seen by Dr. Julien Nordmann.  Last night with pain and felt like she would pass out.  Symptoms resolved.  Glucose 180s.  No h/o echo, has pacer.  Work up reassuring.  Likely needs tele obs for CP, near syncope.    Review of  Systems: As per HPI; otherwise review of systems reviewed and negative.   Ambulatory Status:  Ambulates without assistance  COVID Vaccine Status:  None  Past Medical History:  Diagnosis Date  . CAD (coronary artery disease), native coronary artery    PTCA of distal right coronary artery 1997 Cypher stents to circumflex 2005 Cardiac cath in 2011 with patent stent to circumflex and moderate disease elsewhere treated medically   . Cancer ([place])   . Cardiac pacemaker in situ 12/24/2015   Original implant reportedly in 1991 for tachybradycardia syndrome, generator change in 1999, 2004 and evidently again in 2016 in [place]   . COPD (chronic obstructive pulmonary disease) ([place])   . Diabetes mellitus without complication ([place])   . Hypertension   . Hypothyroidism 03/23/2010  . Pacemaker   . Paroxysmal atrial fibrillation ([place]) 03/23/2010   CHA2DS2VASC score 5     Past Surgical History:  Procedure Laterality Date  . CARDIAC CATHETERIZATION N/A 12/26/2015   Procedure: Left Heart Cath and Coronary Angiography;  Surgeon: Peter M [name], MD;  Location: [place] [facility];  Service: Cardiovascular;  Laterality: N/A;  . CARDIOVERSION  05/2015  . CHOLECYSTECTOMY  2012  . CORONARY ANGIOPLASTY WITH STENT PLACEMENT  1990  . HERNIA REPAIR    . PACEMAKER GENERATOR CHANGE  [id number], 2016  .  PACEMAKER INSERTION  1991    Social History   Socioeconomic History  . Marital status: Widowed    Spouse name: Not on file  . Number of children: Not on file  . Years of education: Not on file  . Highest education level: Not on file  Occupational History  . Occupation: retired  Tobacco Use  . Smoking status: Former Smoker    Packs/day: 0.75    Years: 66.00    Pack years: 49.50    Quit date: 12/27/2019    Years since quitting: 0.0  . Smokeless tobacco: Never Used  Substance and Sexual Activity  . Alcohol use: No    Alcohol/week: 0.0 standard drinks  . Drug use: No  . Sexual activity: Not on file    Other Topics Concern  . Not on file  Social History Narrative  . Not on file   Social Determinants of Health   Financial Resource Strain:   . Difficulty of Paying Living Expenses:   Food Insecurity:   . Worried About Charity fundraiser in the Last Year:   . Arboriculturist in the Last Year:   Transportation Needs:   . Film/video editor (Medical):   Marland Kitchen Lack of Transportation (Non-Medical):   Physical Activity:   . Days of Exercise per Week:   . Minutes of Exercise per Session:   Stress:   . Feeling of Stress :   Social Connections:   . Frequency of Communication with Friends and Family:   . Frequency of Social Gatherings with Friends and Family:   . Attends Religious Services:   . Active Member of Clubs or Organizations:   . Attends Archivist Meetings:   Marland Kitchen Marital Status:   Intimate Partner Violence:   . Fear of Current or Ex-Partner:   . Emotionally Abused:   Marland Kitchen Physically Abused:   . Sexually Abused:     No Known Allergies  Family History  Problem Relation Age of Onset  . Heart disease Father   . Heart disease Mother   . Heart attack Mother   . Cirrhosis Brother   . Colon cancer Neg Hx   . Stomach cancer Neg Hx     Prior to Admission medications   Medication Sig Start Date End Date Taking? Authorizing Provider  acetaminophen (TYLENOL) 500 MG tablet Take 500 mg by mouth every 6 (six) hours as needed for headache.    [provider]  albuterol (VENTOLIN HFA) 108 (90 Base) MCG/ACT inhaler TAKE 2 PUFFS BY MOUTH EVERY 6 HOURS AS NEEDED FOR WHEEZE OR SHORTNESS OF BREATH*NOT COVERED* Patient taking differently: Inhale 2 puffs into the lungs every 6 (six) hours as needed for wheezing or shortness of breath.  03/30/19   [name], [name], MD  atorvastatin (LIPITOR) 10 MG tablet Take 1 tablet (10 mg total) by mouth daily. Patient taking differently: Take 10 mg by mouth daily with supper.  01/29/19   Isaiah Serge, NP  carvedilol (COREG) 12.5 MG  tablet TAKE 1 TABLET (12.5 MG TOTAL) BY MOUTH 2 (TWO) TIMES DAILY WITH A MEAL. 10/12/19   Laurey Morale, MD  [facility] NICOTINE TRANSDERMAL SYS 14 MG/24HR patch PLACE 1 PATCH (14 MG TOTAL) ONTO THE SKIN DAILY. 12/26/18   [name], [name], MD  diclofenac sodium (VOLTAREN) 1 % GEL Apply 4 g topically 4 (four) times daily. Patient taking differently: Apply 4 g topically 4 (four) times daily as needed (pain).  11/12/18   [name], Betty  G, MD  dicyclomine (BENTYL) 10 MG capsule TAKE 1 CAPSULE (10 MG TOTAL) BY MOUTH 4 (FOUR) TIMES DAILY - BEFORE MEALS AND AT BEDTIME. 01/04/20   [name], [name], MD  dronedarone (MULTAQ) 400 MG tablet Take 1 tablet (400 mg total) by mouth daily. Patient taking differently: Take 400 mg by mouth daily after breakfast.  03/13/19   [name], MD  fenofibrate (TRICOR) 48 MG tablet TAKE 1 TABLET BY MOUTH EVERY DAY Patient taking differently: Take 48 mg by mouth daily after lunch.  11/13/19   [name], [name], MD  glucose blood [facility] VERIO) test strip Use to test 3-4 times daily. 09/08/19   [name], [name], MD  levothyroxine (SYNTHROID) 112 MCG tablet TAKE 1 TABLET (112 MCG TOTAL) BY MOUTH DAILY BEFORE BREAKFAST. 07/01/19   [name], [name], MD  linagliptin (TRADJENTA) 5 MG TABS tablet TAKE 1 TABLET (5 MG TOTAL) DAILY BY MOUTH. Patient taking differently: Take 5 mg by mouth daily after breakfast.  12/23/19   [name], [name], MD  losartan (COZAAR) 50 MG tablet Take 2 tablets by mouth daily. Patient taking differently: Take 100 mg by mouth daily.  12/23/19   [name], [name], MD  nitroGLYCERIN (NITROSTAT) 0.4 MG SL tablet Place 1 tablet (0.4 mg total) under the tongue every 5 (five) minutes x 3 doses as needed for chest pain (Max 3 doses within 15 min. Call 911). Patient not taking: Reported on 01/01/2020 12/30/19   [name], [name], MD  pantoprazole (PROTONIX) 40 MG tablet Take 1 tablet (40 mg total) by mouth daily. Patient taking differently: Take 40 mg by mouth at bedtime.  12/15/19   [name],  [name], MD  potassium chloride SA (KLOR-CON) 20 MEQ tablet 1 tab bid x 2 then 1 tab daily Patient taking differently: Take 20 mEq by mouth at bedtime.  12/15/19 01/15/20  [name], [name], MD  XARELTO 15 MG TABS tablet TAKE 1 TABLET BY MOUTH DAILY WITH SUPPER. Patient taking differently: Take 15 mg by mouth daily with supper.  12/16/19   Constance Haw, MD    Physical Exam: Vitals:   01/05/20 1000 01/05/20 1030 01/05/20 1100 01/05/20 1130  BP: 137/60 (!) 143/55 (!) 145/63 (!) 149/53  Pulse: 69 70 70 70  Resp:  17 19 (!) 21  Temp:      TempSrc:      SpO2: 95% 95% 95% 96%  Weight:      Height:         . General:  Appears calm and comfortable and is NAD . Eyes:  PERRL, EOMI, normal lids, iris . ENT:  grossly normal hearing, lips & tongue, mmm; edentulous . Neck:  no LAD, masses or thyromegaly . Cardiovascular:  RRR, no m/r/g. No LE edema.  Marland Kitchen Respiratory:   Scattered wheezes with good air movement, barrel chested.  Normal respiratory effort. . Abdomen:  soft, NT, ND, NABS . Back:   normal alignment, no CVAT . Skin:  no rash or induration seen on limited exam . Musculoskeletal:  grossly normal tone BUE/BLE, good ROM, no bony abnormality . Psychiatric:  grossly normal mood and affect, speech fluent and appropriate, AOx3 . Neurologic:  CN 2-12 grossly intact, moves all extremities in coordinated fashion    Radiological Exams on Admission: DG Chest Portable 1 View  Result Date: 01/05/2020 CLINICAL DATA:  Near syncope. EXAM: PORTABLE CHEST 1 VIEW COMPARISON:  11/07/2018 FINDINGS: There is a multi lead right-sided pacemaker in place. The heart size is  stable from prior study. There are aortic calcifications. The previously demonstrated bilateral lung masses are better visualized on prior CT of the chest. Emphysematous changes are noted. There is no pneumothorax. Small bilateral pleural effusions are noted. IMPRESSION: 1. Cardiomegaly with small bilateral pleural effusions. 2.  Previously demonstrated lung masses are better visualized on prior CT. 3. Emphysema. Electronically Signed   By: Constance Holster M.D.   On: 01/05/2020 03:16    EKG: Independently reviewed.  Atrial paced rhythm with rate 70; nonspecific ST changes with no evidence of acute ischemia; NSCSLT   Labs on Admission: I have personally reviewed the available labs and imaging studies at the time of the admission.  Pertinent labs:   Glucose 120 HS troponin 6 WBC 4.5 Hgb 10.4 UA: 30 protein, rare bacteria   Assessment/Plan Principal Problem:   Near syncope Active Problems:   Hypothyroidism   CAD (coronary artery disease), native coronary artery   Paroxysmal atrial fibrillation (HCC)   COPD (chronic obstructive pulmonary disease) (HCC)   Cardiac pacemaker in situ   Type 2 diabetes mellitus with other specified complication (HCC)   Tobacco use disorder   Hypertension with heart disease   Hyperlipidemia associated with type 2 diabetes mellitus (HCC)   Chronic diarrhea   Near syncope  -Patient with h/o chronic diarrhea and prior syncopal episode was thought to be related to that; however, her diarrhea has improved on Bentyl and yet she had another episode yesterday -She did have some chest discomfort during the episode but the history is really vague  -Etiology is not clear. The differential diagnosis is broad, including vasovagal syncope, seizure, TIA/stroke given right facial droop, arrhythmia, ACS (less likely, given no chest pain), alcohol intoxication, drug abuse, orthostatic status, carotid artery stenosis -Will monitor on telemetry -Orthostatic vital signs in AM -Trending troponin  -2d echo -Neuro checks  -check TSH -PT/OT eval and treat  Presumed metastatic lung cancer -Initial appointment with Dr. Julien Nordmann was on 4/2 -She is scheduled for PET scan and CT head on 4/16 -Given her near syncope today, will proceed with CT head as ordered at this time instead (no MRI due to  pacemaker) -She is to be referred to either CT surgery or pulmonology for biopsy; it does not appear that this is confirmed at this time -Will add Dr. Julien Nordmann to the treatment team to make him aware of the admission -It is possible that transient hypoxia vs other physiologic changes in association with her lung cancer is creating near syncope  Chronic diarrhea -Patient was recently started on Bentyl by Dr. [name] with significant improvement -Continue Bentyl -Based on slowed output, it appears unlikely that this was the cause of her episode  HTN -Continue Coreg, Cozaar  DM -Recent A1c was 6.5 -hold Tradjenta -Cover with sensitive-scale SSI  COPD -Continue Albuterol prn -There is no current evidence of exacerbation  Hypothyroidism -Check TSH -Continue Synthroid at current dose for now  Afib -Pacemaker is present -Continue Multaq -Continue Xarelto  HLD -Continue Tricor and Lipitor  CAD -She is not on ASA due to Xarelto -Normal troponin, but will repeat -Low suspicion for ACS based on current description of symptoms  Tobacco dependence -She stopped smoking with her last hospitalization; praise provided -Continue nicotine transdermal  H/o COVID-19 infection -Patient with no current symptoms associated with recent infection -Her test was positive on 1/15 -Since COVID patients can have a positive test for up to 90 days post-infection without being infectious, there does not appear to be an indication  for repeat testing at this time     DVT prophylaxis: Xarelto Code Status:  Full - confirmed with patient Family Communication: None present; I spoke with the patient's daughter by telephone at the time of admission. Disposition Plan: She is anticipated to d/c to home without [facility] services once her syncope issues have been resolved. Consults called: Oncology - added to treatment team Admission status: It is my clinical opinion that referral for OBSERVATION is reasonable and  necessary in this patient based on the above information provided. The aforementioned taken together are felt to place the patient at high risk for further clinical deterioration. However it is anticipated that the patient may be medically stable for discharge from the hospital within 24 to 48 hours.    Karmen Bongo MD [facility]   How to contact the [facility] Attending or Consulting provider [place] or covering provider during after hours [place], for this patient?  1. Check the care team in [facility] and look for a) attending/consulting TRH provider listed and b) the [facility] team listed 2. Log into [email or web address] and use [place]'s universal password to access. If you do not have the password, please contact the hospital operator. 3. Locate the [facility] provider you are looking for under [facility] and page to a number that you can be directly reached. 4. If you still have difficulty reaching the provider, please page the [facility] (Director on Call) for the Hospitalists listed on amion for assistance.   01/05/2020, 12:29 PM

## 2020-01-05 NOTE — ED Provider Notes (Addendum)
TIME SEEN: 2:25 AM  CHIEF COMPLAINT: Near syncope, chest pain, diaphoresis  HPI: Patient is an 85 year old female with history of CAD status post stents, hypertension, A. 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 States that she had eaten toast and coffee this morning and was in her kitchen and walked to the bedroom when she fell like everything was getting dark. She had central chest pressure with radiation to the back and became very diaphoretic. She did not lose consciousness or fall to the ground. No headache, numbness, weakness. No shortness of breath. Has had intermittent chest pain over the past month that she describes as feeling like indigestion. She describes it as a tightness, pressure in the center of her chest. She is on Nexium without relief. States this is the first time that she has had an episode of becoming diaphoretic and feeling like she was going to pass out. States when the fire department got there her blood sugar was in the 180s. She has had diarrhea for several years that is unchanged. She did have some vomiting last week that has resolved. No bloody stools or melena. No chest pain currently.  On review of records, patient was seen in the emergency department on 12/27/2019 for upper abdominal pain/lower chest pain. Had a CTA of the chest, abdomen pelvis that showed no aortic dissection or aneurysm, no PE. She did have bilateral lung masses in the right upper and left lower lobe with associated hilar and mediastinal adenopathy consistent with neoplasm. She had a right-sided pleural effusion as well. No other acute abnormality seen in the abdomen pelvis. Is currently being followed by Dr. Earlie Server with oncology and is scheduled for CT of the head and PET scan on 01/15/2020. Has not had a biopsy and is not yet on any treatment.  PCP -  [name]  ROS: See HPI Constitutional: no fever  Eyes: no drainage  ENT: no runny nose   Cardiovascular:   + chest pain  Resp: no SOB  GI: no vomiting GU: no dysuria Integumentary: no rash  Allergy: no hives  Musculoskeletal: no leg swelling  Neurological: no slurred speech ROS otherwise negative  PAST MEDICAL HISTORY/PAST SURGICAL HISTORY:  Past Medical History:  Diagnosis Date  . CAD (coronary artery disease), native coronary artery    PTCA of distal right coronary artery 1997 Cypher stents to circumflex 2005 Cardiac cath in 2011 with patent stent to circumflex and moderate disease elsewhere treated medically   . Cancer ([place])   . Cardiac pacemaker in situ 12/24/2015   Original implant reportedly in 1991 for tachybradycardia syndrome, generator change in 1999, 2004 and evidently again in 2016 in [place]   . COPD (chronic obstructive pulmonary disease) ([place])   . Diabetes mellitus without complication ([place])   . Hypertension   . Hypothyroidism 03/23/2010  . Pacemaker   . Paroxysmal atrial fibrillation ([place]) 03/23/2010   CHA2DS2VASC score 5     MEDICATIONS:  Prior to Admission medications   Medication Sig Start Date End Date Taking? Authorizing Provider  acetaminophen (TYLENOL) 500 MG tablet Take 500 mg by mouth every 6 (six) hours as needed for headache.    [provider]  albuterol (VENTOLIN HFA) 108 (90 Base) MCG/ACT inhaler TAKE 2 PUFFS BY MOUTH EVERY 6 HOURS AS NEEDED FOR WHEEZE OR SHORTNESS OF BREATH*NOT COVERED* Patient taking differently: Inhale  2 puffs into the lungs every 6 (six) hours as needed for wheezing or shortness of breath.  03/30/19   [name], [name], MD  atorvastatin (LIPITOR) 10 MG tablet Take 1 tablet (10 mg total) by mouth daily. Patient taking differently: Take 10 mg by mouth daily with supper.  01/29/19   Isaiah Serge, NP  carvedilol (COREG) 12.5 MG tablet TAKE 1 TABLET (12.5 MG TOTAL) BY MOUTH 2 (TWO) TIMES DAILY WITH A MEAL. 10/12/19    Laurey Morale, MD  [facility] NICOTINE TRANSDERMAL SYS 14 MG/24HR patch PLACE 1 PATCH (14 MG TOTAL) ONTO THE SKIN DAILY. 12/26/18   [name], [name], MD  diclofenac sodium (VOLTAREN) 1 % GEL Apply 4 g topically 4 (four) times daily. Patient taking differently: Apply 4 g topically 4 (four) times daily as needed (pain).  11/12/18   [name], [name], MD  dicyclomine (BENTYL) 10 MG capsule TAKE 1 CAPSULE (10 MG TOTAL) BY MOUTH 4 (FOUR) TIMES DAILY - BEFORE MEALS AND AT BEDTIME. 01/04/20   [name], [name], MD  dronedarone (MULTAQ) 400 MG tablet Take 1 tablet (400 mg total) by mouth daily. Patient taking differently: Take 400 mg by mouth daily after breakfast.  03/13/19   [name], MD  fenofibrate (TRICOR) 48 MG tablet TAKE 1 TABLET BY MOUTH EVERY DAY Patient taking differently: Take 48 mg by mouth daily after lunch.  11/13/19   [name], [name], MD  glucose blood [facility] VERIO) test strip Use to test 3-4 times daily. 09/08/19   [name], [name], MD  levothyroxine (SYNTHROID) 112 MCG tablet TAKE 1 TABLET (112 MCG TOTAL) BY MOUTH DAILY BEFORE BREAKFAST. 07/01/19   [name], [name], MD  linagliptin (TRADJENTA) 5 MG TABS tablet TAKE 1 TABLET (5 MG TOTAL) DAILY BY MOUTH. Patient taking differently: Take 5 mg by mouth daily after breakfast.  12/23/19   [name], [name], MD  losartan (COZAAR) 50 MG tablet Take 2 tablets by mouth daily. Patient taking differently: Take 100 mg by mouth daily.  12/23/19   [name], [name], MD  nitroGLYCERIN (NITROSTAT) 0.4 MG SL tablet Place 1 tablet (0.4 mg total) under the tongue every 5 (five) minutes x 3 doses as needed for chest pain (Max 3 doses within 15 min. Call 911). Patient not taking: Reported on 01/01/2020 12/30/19   [name], [name], MD  pantoprazole (PROTONIX) 40 MG tablet Take 1 tablet (40 mg total) by mouth daily. Patient taking differently: Take 40 mg by mouth at bedtime.  12/15/19   [name], [name], MD  potassium chloride SA (KLOR-CON) 20 MEQ tablet 1 tab bid x 2 then 1 tab  daily Patient taking differently: Take 20 mEq by mouth at bedtime.  12/15/19 01/15/20  [name], [name], MD  XARELTO 15 MG TABS tablet TAKE 1 TABLET BY MOUTH DAILY WITH SUPPER. Patient taking differently: Take 15 mg by mouth daily with supper.  12/16/19   Camnitz, Ocie Doyne, MD    ALLERGIES:  No Known Allergies  SOCIAL HISTORY:  Social History   Tobacco Use  . Smoking status: Current Every Day Smoker    Packs/day: 0.30  . Smokeless tobacco: Never Used  Substance Use Topics  . Alcohol use: No    Alcohol/week: 0.0 standard drinks    FAMILY HISTORY: Family History  Problem Relation Age of Onset  . Heart disease Father   . Heart disease Mother   . Heart attack Mother   . Cirrhosis Brother   . Colon cancer Neg  Hx   . Stomach cancer Neg Hx     EXAM: BP (!) 115/53 (BP Location: Left Arm)   Pulse 81   Temp 98.3 F (36.8 C) (Oral)   Resp 14   Ht 5\' 2"  (1.575 m)   Wt 51.7 kg   LMP  (LMP Unknown)   SpO2 99%   BMI 20.85 kg/m  CONSTITUTIONAL: Alert and oriented and responds appropriately to questions. Elderly, no distress HEAD: Normocephalic EYES: Conjunctivae clear, pupils appear equal, EOM appear intact, no conjunctival pallor ENT: normal nose; moist mucous membranes NECK: Supple, normal ROM CARD: RRR; S1 and S2 appreciated; no murmurs, no clicks, no rubs, no gallops RESP: Normal chest excursion without splinting or tachypnea; breath sounds clear and equal bilaterally; no wheezes, no rhonchi, no rales, no hypoxia or respiratory distress, speaking full sentences ABD/GI: Normal bowel sounds; non-distended; soft, non-tender, no rebound, no guarding, no peritoneal signs, no hepatosplenomegaly BACK:  The back appears normal EXT: Normal ROM in all joints; no deformity noted, no edema; no cyanosis, no calf tenderness or calf swelling SKIN: Normal color for age and race; warm; no rash on exposed skin NEURO: Moves all extremities equally, sensation to light touch intact diffusely,  cranial nerves II through XII intact, no drift, normal speech PSYCH: The patient's mood and manner are appropriate.   MEDICAL DECISION MAKING: Patient here with episode of chest pain, diaphoresis and near syncope. Blood sugar was 180s after this episode. I have reviewed and interpreted her EKG shows a paced rhythm without ischemic changes, arrhythmia. We will interrogate her pacemaker. I have also reviewed and interpreted her labs. She has hemoglobin of 10.4 which is stable. Normal electrolytes, renal function. We will add on a troponin and obtain a chest x-ray today. She recently had a CT scan that showed no PE, dissection, aortic aneurysm and I do not feel this needs to be repeated today. I have recommended observation admission to the hospital given she has significant cardiac history. Patient's daughter would prefer this plan. Patient is comfortable with this plan.  ED PROGRESS: Patient's troponin is negative.  Her orthostats are unremarkable.  She has been able to ambulate to the bathroom.  She continues to be asymptomatic.  Her pacemaker has been interrogated and there have not been any arrhythmias in 72 hours.  Her chest x-ray has been independently reviewed and interpreted by myself and radiology.  She has cardiomegaly, small bilateral pleural effusions and bilateral lung masses.  Will discuss with hospitalist for observation admission given chest pain, diaphoresis with near syncopal event today.  No recent provocative testing.  No recent echo.   7:28 AM Discussed patient's case with hospitalist, Dr. Lorin Mercy.  I have recommended admission and patient (and family if present) agree with this plan. Admitting physician will place admission orders.   I reviewed all nursing notes, vitals, pertinent previous records and interpreted all EKGs, lab and urine results, imaging (as available).     EKG Interpretation  Date/Time:  Monday January 04 2020 17:53:16 EDT Ventricular Rate:  70 PR Interval:     QRS Duration: 86 QT Interval:  422 QTC Calculation: 455 R Axis:   76 Text Interpretation: Atrial-paced rhythm with prolonged AV conduction Nonspecific T wave abnormality Abnormal ECG No significant change since last tracing Confirmed by Pryor Curia [phone]) on 01/05/2020 2:25:19 AM          Elyse Hsu was evaluated in Emergency Department on 01/05/2020 for the symptoms described in the history of present illness. She was  evaluated in the context of the global COVID-19 pandemic, which necessitated consideration that the patient might be at risk for infection with the SARS-CoV-2 virus that causes COVID-19. Institutional protocols and algorithms that pertain to the evaluation of patients at risk for COVID-19 are in a state of rapid change based on information released by regulatory bodies including the CDC and federal and state organizations. These policies and algorithms were followed during the patient's care in the ED.      Ward, Delice Bison, DO 01/05/20 [place], Delice Bison, DO 01/05/20 [phone]

## 2020-01-05 NOTE — Evaluation (Signed)
Occupational Therapy Evaluation Patient Details Name: [name] MRN: [id number] DOB: 11-20-1934 Today's Date: 01/05/2020    History of Present Illness Pt is an 85 y/o female admitted secondary to near syncope. Workup pending. Pt with recent admission that found lung masses. PMH includes CAD s/p stent, s/p pacemaker, a fib, COPD, DM, HTN, and tobacco use.   Clinical Impression   PTA, pt was living with her daughter and was independent with BADLs. Currently, pt requires Supervision for ADLs and functional mobility. Pt reporting fatigue with standing in shower at home. Provided handout and education on tub transfer with 3N1; pt verbalized understanding. Answered all pt questions. Recommend dc home once medically stable per physician. All acute OT needs met and will sign off. Thank you.     Follow Up Recommendations  No OT follow up;Supervision - Intermittent    Equipment Recommendations  None recommended by OT    Recommendations for Other Services       Precautions / Restrictions Precautions Precautions: Fall Restrictions Weight Bearing Restrictions: No      Mobility Bed Mobility Overal bed mobility: Needs Assistance Bed Mobility: Supine to Sit     Supine to sit: Supervision     General bed mobility comments: supervision for safety and line management  Transfers Overall transfer level: Needs assistance Equipment used: None Transfers: Sit to/from Stand Sit to Stand: Supervision(close supervision)         General transfer comment: Supervision for safety. standing close by    Balance Overall balance assessment: Needs assistance Sitting-balance support: Feet supported;No upper extremity supported Sitting balance-Leahy Scale: Good     Standing balance support: No upper extremity supported;During functional activity Standing balance-Leahy Scale: Fair                             ADL either performed or assessed with clinical judgement   ADL Overall  ADL's : Needs assistance/impaired Eating/Feeding: Set up;Sitting   Grooming: Supervision/safety;Wash/dry face;Standing   Upper Body Bathing: Set up;Sitting   Lower Body Bathing: Supervison/ safety;Sit to/from stand   Upper Body Dressing : Set up;Sitting   Lower Body Dressing: Supervision/safety;Sit to/from stand Lower Body Dressing Details (indicate cue type and reason): Pt able to bend forward and pull up pants  Toilet Transfer: Supervision/safety;Ambulation;Regular Glass blower/designer Details (indicate cue type and reason): Supervision for safety. Pt reporting she sues 3N1 in her room at night to prevent falls Toileting- Water quality scientist and Hygiene: Supervision/safety;Sitting/lateral lean     Tub/Shower Transfer Details (indicate cue type and reason): Educating pt on use of 3N1 as shower seat and performing safe tub transfer. Providing handout for increased carry over to home. Pt very appreciative Functional mobility during ADLs: Supervision/safety General ADL Comments: Pt performing ADLs and functional mobility at Supervision level.     Vision         Perception     Praxis      Pertinent Vitals/Pain Pain Assessment: Faces Faces Pain Scale: Hurts little more Pain Location: back  Pain Descriptors / Indicators: Aching Pain Intervention(s): Monitored during session;Repositioned     Hand Dominance Right   Extremity/Trunk Assessment Upper Extremity Assessment Upper Extremity Assessment: Overall WFL for tasks assessed(WFL for age)   Lower Extremity Assessment Lower Extremity Assessment: Defer to PT evaluation   Cervical / Trunk Assessment Cervical / Trunk Assessment: Normal;Kyphotic   Communication Communication Communication: No difficulties   Cognition Arousal/Alertness: Awake/alert Behavior During Therapy: WFL for tasks assessed/performed Overall Cognitive  Status: Within Functional Limits for tasks assessed                                  General Comments: Very motivated to participate and perform ADLs   General Comments  BP at end of session 138/54. Denies dizziness    Exercises     Shoulder Instructions      Home Living Family/patient expects to be discharged to:: Private residence Living Arrangements: Children Available Help at Discharge: Family;Available 24 hours/day Type of Home: House Home Access: Stairs to enter Technical brewer of Steps: 1(threshold) Entrance Stairs-Rails: None Home Layout: One level     Bathroom Shower/Tub: Teacher, early years/pre: Standard     Home Equipment: Bedside commode          Prior Functioning/Environment Level of Independence: Independent                 OT Problem List: Decreased activity tolerance;Decreased knowledge of precautions;Decreased knowledge of use of DME or AE      OT Treatment/Interventions:      OT Goals(Current goals can be found in the care plan section) Acute Rehab OT Goals Patient Stated Goal: to go home OT Goal Formulation: All assessment and education complete, DC therapy  OT Frequency:     Barriers to D/C:            Co-evaluation              AM-PAC OT "6 Clicks" Daily Activity     Outcome Measure Help from another person eating meals?: A Little Help from another person taking care of personal grooming?: A Little Help from another person toileting, which includes using toliet, bedpan, or urinal?: A Little Help from another person bathing (including washing, rinsing, drying)?: A Little Help from another person to put on and taking off regular upper body clothing?: A Little Help from another person to put on and taking off regular lower body clothing?: A Little 6 Click Score: 18   End of Session Nurse Communication: Mobility status  Activity Tolerance: Patient tolerated treatment well Patient left: in chair;with call bell/phone within reach;with chair alarm set;with nursing/sitter in room  OT Visit  Diagnosis: Unsteadiness on feet (R26.81);Other abnormalities of gait and mobility (R26.89);Muscle weakness (generalized) (M62.81)                Time: 4696-2952 OT Time Calculation (min): 21 min Charges:  OT General Charges $OT Visit: 1 Visit OT Evaluation $OT Eval Low Complexity: [place], OTR/L Acute Rehab Pager: [phone] Office: [place] 01/05/2020, 4:59 PM

## 2020-01-05 NOTE — Evaluation (Signed)
Physical Therapy Evaluation Patient Details Name: [name] MRN: [id number] DOB: 02-27-1935 Today's Date: 01/05/2020   History of Present Illness  Pt is an 85 y/o female admitted secondary to near syncope. Workup pending. Pt with recent admission that found lung masses. PMH includes CAD s/p stent, s/p pacemaker, a fib, COPD, DM, HTN, and tobacco use.  Clinical Impression  Pt admitted secondary to problem above with deficits below. Pt tolerated mobility well. Mild unsteadiness, however, no LOB noted. Orthostatics negative and pt asymptomatic throughout. Pt reports daughter and son in law able to assist at home. Will continue to follow acutely to maximize functional mobility independence and safety.     Follow Up Recommendations No PT follow up;Supervision for mobility/OOB    Equipment Recommendations  None recommended by PT    Recommendations for Other Services       Precautions / Restrictions Precautions Precautions: Fall Restrictions Weight Bearing Restrictions: No      Mobility  Bed Mobility Overal bed mobility: Needs Assistance Bed Mobility: Supine to Sit     Supine to sit: Supervision     General bed mobility comments: supervision for safety and line management  Transfers Overall transfer level: Needs assistance Equipment used: None Transfers: Sit to/from Stand Sit to Stand: Min guard         General transfer comment: Min guard for safety.   Ambulation/Gait Ambulation/Gait assistance: Min guard Gait Distance (Feet): 15 Feet Assistive device: None Gait Pattern/deviations: Step-through pattern;Decreased stride length Gait velocity: Decreased   General Gait Details: Mild unsteadiness, however, no LOB noted. Ambulation within the room as pt still in ED and connected to monitors. Pt requesting to sit in chair at end of session; RN notified and agreed to let pt sit in chair.   Stairs            Wheelchair Mobility    Modified Rankin (Stroke Patients  Only)       Balance Overall balance assessment: Needs assistance Sitting-balance support: Feet supported;No upper extremity supported Sitting balance-Leahy Scale: Good     Standing balance support: No upper extremity supported;During functional activity Standing balance-Leahy Scale: Fair                               Pertinent Vitals/Pain Pain Assessment: 0-10 Pain Score: 5  Pain Location: back  Pain Descriptors / Indicators: Aching Pain Intervention(s): Limited activity within patient's tolerance;Monitored during session;Repositioned    Home Living Family/patient expects to be discharged to:: Private residence Living Arrangements: Children Available Help at Discharge: Family;Available 24 hours/day Type of Home: House Home Access: Stairs to enter Entrance Stairs-Rails: None Entrance Stairs-Number of Steps: 1(threshold) Home Layout: One level Home Equipment: Bedside commode      Prior Function Level of Independence: Independent               Hand Dominance        Extremity/Trunk Assessment   Upper Extremity Assessment Upper Extremity Assessment: Defer to OT evaluation    Lower Extremity Assessment Lower Extremity Assessment: Generalized weakness    Cervical / Trunk Assessment Cervical / Trunk Assessment: Normal  Communication   Communication: No difficulties  Cognition Arousal/Alertness: Awake/alert Behavior During Therapy: WFL for tasks assessed/performed Overall Cognitive Status: Within Functional Limits for tasks assessed  General Comments General comments (skin integrity, edema, etc.): Orthostatics negative    Exercises     Assessment/Plan    PT Assessment Patient needs continued PT services  PT Problem List Decreased strength;Decreased mobility;Decreased activity tolerance       PT Treatment Interventions DME instruction;Stair training;Gait training;Functional mobility  training;Therapeutic exercise;Therapeutic activities;Balance training;Patient/family education    PT Goals (Current goals can be found in the Care Plan section)  Acute Rehab PT Goals Patient Stated Goal: to go home PT Goal Formulation: With patient Time For Goal Achievement: 01/19/20 Potential to Achieve Goals: Good    Frequency Min 3X/week   Barriers to discharge        Co-evaluation               AM-PAC PT "6 Clicks" Mobility  Outcome Measure Help needed turning from your back to your side while in a flat bed without using bedrails?: None Help needed moving from lying on your back to sitting on the side of a flat bed without using bedrails?: None Help needed moving to and from a bed to a chair (including a wheelchair)?: A Little Help needed standing up from a chair using your arms (e.g., wheelchair or bedside chair)?: A Little Help needed to walk in hospital room?: A Little Help needed climbing 3-5 steps with a railing? : A Little 6 Click Score: 20    End of Session Equipment Utilized During Treatment: Gait belt Activity Tolerance: Patient tolerated treatment well Patient left: in chair;with call bell/phone within reach Nurse Communication: Mobility status PT Visit Diagnosis: Other abnormalities of gait and mobility (R26.89);Muscle weakness (generalized) (M62.81)    Time: 4801-6553 PT Time Calculation (min) (ACUTE ONLY): 16 min   Charges:   PT Evaluation $PT Eval Low Complexity: 1 Low          Lou Miner, DPT  Acute Rehabilitation Services  Pager: [phone] Office: [phone]   Rudean Hitt 01/05/2020, 1:48 PM

## 2020-01-05 NOTE — H&P (View-Only) (Signed)
NAME:  [name], MRN:  [id number], DOB:  1935/09/01, LOS: 0 ADMISSION DATE:  01/04/2020, CONSULTATION DATE:  01/05/2020 REFERRING MD:  Dr. Lorin Mercy, CHIEF COMPLAINT:  Lung masses  Brief History   85 year old female admitted for workup of near syncope.  Recent finding of 12/27/2019 of bilateral lung masses with mediastinal lymphadenopathy.  Seen by Dr. Julien Nordmann in office 4/2 with plans for PET scan and CT head.  PCCM consulted for further evaluation of lung masses.    History of present illness    85 year old female with prior history of tobacco abuse, COPD not on home oxygen, CAD, Afib on Xarelto, tachy/ brady syndrome s/p pacemaker, hypothyroidism, HTN, DM and recent COVID in 10/2019, presenting with near syncope episode and central chest pressure/ tightness with radiation into her back.   Patient reports she has not been well for some time.  Reports chronic diarrhea for 2 years and since improved after taking imodium.  However, over the last month, due to worsening watery diarrhea and incontinence at times, she has not been eating like she should.  She has had over 21 lb unintentional weight loss over 1 month and now her dentures do not fit well which further inhibits her eating.  Also reports chest tightness and indigestion with a lot of belching in which she has been taking nexium for for the last two months.  Her chest tightness is affected by deep breathing.  Reports exertional dyspnea and increasing hair loss.  Denies any lower leg swelling, change in chronic cough, hemoptysis, or bloody stools.  She currently lives with one of daughters, [name] and is retired. Works in [employer] and [employer] prior to.  She currently smokes around 6 cigarettes a day, has smoked 0.5- 1 ppd since she was 85 years old.   Recently evaluated in the ER 12/27/2019 for similar chest and abdominal pain.  Underwent CTA of chest / abd/ pelvis which was negative for PE or dissection but showed bilateral lung nodules- LLL  mass 2.1 x 1.7 cm and RUL spiculated mass 3.1 x 2.4 cm, moderate right pleural effusion with atelecasis, mediastinal adenopathy including a 3.0 cm subcarinal lymph node, 2.3 cm precarinal node, and 1.8 prevascular lymph node, and smaller pretracheal nodes; also noted for 1.8 cm right hilar lymph node and 1.1 cm left hilar lymph node.  She followed up with Dr. Earlie Server with oncology on 4/2 with plans for PET scan and CT head which were scheduled for 4/16 (MRI brain deferred given pacemaker) and feels lesions highly suspicious for metastatic small cell lung cancer.   Yesterday, she did not feel well after doing dishes and had near syncope episode.  CBG 180 with EMS.  In Er, she has been afebrile and hemodynamically stable.  Plans to admit to [facility] for observation for near syncope workup and telemetry monitoring.  Troponin trend flat.  CT head pending.  PCCM consulted for further evaluation/ workup of patient's lung nodules.   Past Medical History  Ongoing tobacco abuse, COPD, CAD, Afib on Xarelto, tachy/ brady syndrome s/p pacemaker, hypothyroidism, HTN, DM  COVID 10/2019  Significant Hospital Events   4/6 admit observation [facility]  Consults:  PCCM  Procedures:   Significant Diagnostic Tests:  12/27/2019 CTA chest/ abd/ pelvis >> No evidence of dissection or aneurysmal dilatation is identified. No evidence of pulmonary emboli. Bilateral lung masses in the right upper and left lower lobe with associated hilar and mediastinal adenopathy consistent with neoplasm. Right-sided pleural effusion is noted  as well. Further workup by means of tissue sampling and PET-CT would be helpful. No acute abnormality in the abdomen is seen. Aortic Atherosclerosis and Emphysema    4/6 CTH >>  Micro Data:  4/6 SARS 2 >>  Antimicrobials:  n/a  Interim history/subjective:   Objective   Blood pressure (!) 137/52, pulse 70, temperature 98.3 F (36.8 C), temperature source Oral, resp. rate 14, height _0  (1.575  m), weight 51.7 kg, SpO2 97 %.       No intake or output data in the 24 hours ending 01/05/20 1412 Filed Weights   01/04/20 1801  Weight: 51.7 kg   Examination: General:  Elderly female lying in bed in NAD HEENT: pupils 3/reactive, edentulous  Neuro: alert, oriented x 3, MAE CV: rr ir, no murmur PULM:  Non labored, speaking full sentences, clear throughout, no wheezes GI: soft, bs+, mild diffuse upper tenderness, no distention Extremities: warm/dry, no edema  Skin: no rashes   Resolved Hospital Problem list     Assessment & Plan:  Bilateral lung masses in current every day smoker ( 49.5 Pack year smoker)   right upper ( 3.1 x 2.4 cm) and left lower lobe ( 2.1 x 1.7 cm) with associated hilar and mediastinal adenopathy consistent with neoplasm. ( 3.0 cm subcarinal lymph node, 2.3 cm precarinal node, 1. 8 cm prevascular lymph node, smaller pretracheal nodes were also seen. Additionally  1.8 cm right hilar lymph node and 1.1 cm left hilar lymph node.) + Right-sided moderate pleural effusion  Plan  - Stop Xarelto now for 48 hour wash out for EBUS 4/9, SCDs for now - If pt is agreeable, Schedule for EBUS Friday 01/08/2020 at 07:30 am per Dr. Valeta Harms for tissue sampling and staging. - Follow up with Dr. Earlie Server within the next 2 weeks for more detailed discussion of her treatment options based on the final pathology and staging work-up. - Referral to Radiation oncology to eval for palliative radiotherapy to obstructive mass.  - CT Head with and without contrast as scheduled to evaluate for brain  metastasis - PET  Scan as OP to evaluate for metastasis/ complete staging - Smoking cessation counseling/ Education - CXR prn to monitor moderate right sided  effusion >> Consider Lasix as needed  COPD Current every day smoker ( Quit 12/27/2019) Emphysema per imaging 49.5 pack year smoking history No maintenance medications per home medications review Plan Add Scheduled Duonebs Continue prn  albuterol for breakthrough bronchospasm Maintain oxygen sats > 88%   2 Year History of diarrhea/ bowel incontinence Epigastric pain x 2 months  Weight loss>> 21 pounds in the last month per patient Plan Was referred to [facility] as OP. Consider inpatient [facility] Consult  SUMMARY OF TODAY'S PLAN:  Admit for observation per Dr. Lorin Mercy Hold Eliquis for EBUS and tissue sampling 4/9 Plan for EBUS 01/08/2020 >> Dr. Valeta Harms for tissue sampling/ staging PET scan for staging Follow up with Dr. Argentina Donovan practice:  DVT PROPHYLAXIS:PAS hose once Eliquis is held SUP: Protonix NUTRITION: Heart smart  MOBILITY: BR GOALS OF CARE:Full code FAMILY DISCUSSIONS: No family at bedside, patient updated on plans DISPOSITION Observation  Labs   CBC: Recent Labs  Lab 01/01/20 0910 01/04/20 1809  WBC 5.2 4.5  NEUTROABS 2.8  --   HGB 10.6* 10.4*  HCT 34.1* 34.2*  MCV 89.7 91.2  PLT 203 009    Basic Metabolic Panel: Recent Labs  Lab 01/01/20 0910 01/04/20 1809  NA 142 136  K 4.2 4.4  CL 107 105  CO2 25 25  GLUCOSE 94 120*  BUN 12 16  CREATININE 0.75 0.89  CALCIUM 9.1 9.0   GFR: Estimated Creatinine Clearance: 38.5 mL/min (by C-G formula based on SCr of 0.89 mg/dL). Recent Labs  Lab 01/01/20 0910 01/04/20 1809  WBC 5.2 4.5    Liver Function Tests: Recent Labs  Lab 01/01/20 0910  AST 13*  ALT 10  ALKPHOS 61  BILITOT 0.3  PROT 7.0  ALBUMIN 3.0*   No results for input(s): LIPASE, AMYLASE in the last 168 hours. No results for input(s): AMMONIA in the last 168 hours.  ABG    Component Value Date/Time   HCO3 26.9 (H) 10/31/2007 1700   TCO2 26 03/12/2010 0833     Coagulation Profile: No results for input(s): INR, PROTIME in the last 168 hours.  Cardiac Enzymes: No results for input(s): CKTOTAL, CKMB, CKMBINDEX, TROPONINI in the last 168 hours.  HbA1C: Hgb A1c MFr Bld  Date/Time Value Ref Range Status  09/30/2019 08:26 AM 6.5 4.6 - 6.5 % Final    Comment:     Glycemic Control Guidelines for People with Diabetes:Non Diabetic:  <6%Goal of Therapy: <7%Additional Action Suggested:  >8%   06/10/2019 09:29 AM 6.2 4.6 - 6.5 % Final    Comment:    Glycemic Control Guidelines for People with Diabetes:Non Diabetic:  <6%Goal of Therapy: <7%Additional Action Suggested:  >8%     CBG: Recent Labs  Lab 01/05/20 0125 01/05/20 1155 01/05/20 1251  GLUCAP 75 62* 90    Review of Systems:   Review of Systems  Constitutional: Positive for weight loss. Negative for chills, fever and malaise/fatigue.  Respiratory: Positive for shortness of breath. Negative for cough, hemoptysis, sputum production and wheezing.        No change in her chronic cough or sputum production Exertional dyspnea   Cardiovascular: Positive for chest pain and leg swelling. Negative for palpitations, orthopnea and claudication.  Gastrointestinal: Positive for abdominal pain and diarrhea. Negative for blood in stool, constipation, melena, nausea and vomiting.  Genitourinary: Negative for hematuria.  Musculoskeletal: Negative for falls.  Skin: Negative for rash.  Neurological: Negative for speech change, focal weakness, loss of consciousness and weakness.  Endo/Heme/Allergies: Positive for environmental allergies.   Past Medical History  She,  has a past medical history of CAD (coronary artery disease), native coronary artery, Cancer ([place]), Cardiac pacemaker in situ (12/24/2015), COPD (chronic obstructive pulmonary disease) ([place]), Diabetes mellitus without complication ([place]), Hypertension, Hypothyroidism (03/23/2010), Pacemaker, and Paroxysmal atrial fibrillation ([place]) (03/23/2010).   Surgical History    Past Surgical History:  Procedure Laterality Date  . CARDIAC CATHETERIZATION N/A 12/26/2015   Procedure: Left Heart Cath and Coronary Angiography;  Surgeon: Peter M [name], MD;  Location: [facility];  Service: Cardiovascular;  Laterality: N/A;  . CARDIOVERSION  05/2015  .  CHOLECYSTECTOMY  2012  . CORONARY ANGIOPLASTY WITH STENT PLACEMENT  1990  . HERNIA REPAIR    . PACEMAKER GENERATOR CHANGE  [id number], 2016  . PACEMAKER INSERTION  1991     Social History   reports that she quit smoking 9 days ago. She has a 49.50 pack-year smoking history. She has never used smokeless tobacco. She reports that she does not drink alcohol or use drugs.   Family History   Her family history includes Cirrhosis in her brother; Heart attack in her mother; Heart disease in her father and mother. There is no history of Colon cancer or Stomach  cancer.   Allergies No Known Allergies   Home Medications  Prior to Admission medications   Medication Sig Start Date End Date Taking? Authorizing Provider  acetaminophen (TYLENOL) 500 MG tablet Take 500 mg by mouth every 6 (six) hours as needed for headache.   Yes [provider]  albuterol (VENTOLIN HFA) 108 (90 Base) MCG/ACT inhaler TAKE 2 PUFFS BY MOUTH EVERY 6 HOURS AS NEEDED FOR WHEEZE OR SHORTNESS OF BREATH*NOT COVERED* Patient taking differently: Inhale 2 puffs into the lungs every 6 (six) hours as needed for wheezing or shortness of breath.  03/30/19  Yes [name], [name], MD  carvedilol (COREG) 12.5 MG tablet TAKE 1 TABLET (12.5 MG TOTAL) BY MOUTH 2 (TWO) TIMES DAILY WITH A MEAL. 10/12/19  Yes Laurey Morale, MD  [employer] NICOTINE TRANSDERMAL SYS 14 MG/24HR patch PLACE 1 PATCH (14 MG TOTAL) ONTO THE SKIN DAILY. 12/26/18  Yes [name], [name], MD  diclofenac sodium (VOLTAREN) 1 % GEL Apply 4 g topically 4 (four) times daily. Patient taking differently: Apply 4 g topically 4 (four) times daily as needed (pain).  11/12/18  Yes [name], [name], MD  dicyclomine (BENTYL) 10 MG capsule TAKE 1 CAPSULE (10 MG TOTAL) BY MOUTH 4 (FOUR) TIMES DAILY - BEFORE MEALS AND AT BEDTIME. 01/04/20  Yes [name], [name], MD  dronedarone (MULTAQ) 400 MG tablet Take 1 tablet (400 mg total) by mouth daily. Patient taking differently: Take 400 mg by mouth daily  after breakfast.  03/13/19  Yes [name], MD  fenofibrate (TRICOR) 48 MG tablet TAKE 1 TABLET BY MOUTH EVERY DAY Patient taking differently: Take 48 mg by mouth daily after lunch.  11/13/19  Yes [name], [name], MD  glucose blood [facility] VERIO) test strip Use to test 3-4 times daily. 09/08/19  Yes [name], [name], MD  levothyroxine (SYNTHROID) 112 MCG tablet TAKE 1 TABLET (112 MCG TOTAL) BY MOUTH DAILY BEFORE BREAKFAST. 07/01/19  Yes [name], [name], MD  linagliptin (TRADJENTA) 5 MG TABS tablet TAKE 1 TABLET (5 MG TOTAL) DAILY BY MOUTH. Patient taking differently: Take 5 mg by mouth daily after breakfast.  12/23/19  Yes [name], [name], MD  losartan (COZAAR) 50 MG tablet Take 2 tablets by mouth daily. Patient taking differently: Take 100 mg by mouth daily.  12/23/19  Yes [name], [name], MD  pantoprazole (PROTONIX) 40 MG tablet Take 1 tablet (40 mg total) by mouth daily. Patient taking differently: Take 40 mg by mouth at bedtime.  12/15/19  Yes [name], [name], MD  potassium chloride SA (KLOR-CON) 20 MEQ tablet 1 tab bid x 2 then 1 tab daily Patient taking differently: Take 20 mEq by mouth at bedtime.  12/15/19 01/15/20 Yes [name], [name], MD  XARELTO 15 MG TABS tablet TAKE 1 TABLET BY MOUTH DAILY WITH SUPPER. Patient taking differently: Take 15 mg by mouth daily with supper.  12/16/19  Yes Camnitz, Will Hassell Done, MD  atorvastatin (LIPITOR) 10 MG tablet Take 1 tablet (10 mg total) by mouth daily. Patient taking differently: Take 10 mg by mouth daily with supper.  01/29/19   Isaiah Serge, NP  nitroGLYCERIN (NITROSTAT) 0.4 MG SL tablet Place 1 tablet (0.4 mg total) under the tongue every 5 (five) minutes x 3 doses as needed for chest pain (Max 3 doses within 15 min. Call 911). Patient not taking: Reported on 01/01/2020 12/30/19   [name], [name], MD       Kennieth Rad, MSN, AGACNP-BC [facility] 01/05/2020, 3:22 PM       PCCM:  85 yo fm, long standing history of  tobacco use, copd, cad, afib on xarelto. Recent ct imaging with concern for lung mass. Has seen Dr. Julien Nordmann in the outpatient setting with planning outpatient PET scheduled. No tissue diagnosis yet.   She was admitted to obs for syncopal episode. CT head negative.   Pulmonary consulted for recommendations.   BP (!) 130/48 (BP Location: Left Arm)   Pulse 70   Temp 98.6 F (37 C) (Oral)   Resp 18   Ht _0  (1.575 m)   Wt 51.7 kg   LMP  (LMP Unknown)   SpO2 99%   BMI 20.85 kg/m   Gen: elderly fm, resting in bed ENT: tracking appropriately Heart: irregular, s1 s2  Lungs: right sided rhonchi, no wheeze  Labs reviewed  CT chest: right sided hilar mass, associated adenopathy, also contralateral mass concerning for met The patient's images have been independently reviewed by me.    A:  Bilateral lung masses  Mediastinal adenopathy  - concerning for primary bronchogenic carcinoma  Long tobacco abuse history  afib on xarelto   P: Hold xarelto  Bronchoscopy, EBUS with tissue sampling for Friday morning 730 Patient is agreeable to proceed. Discussed risks, benefits and alternatives of proceeding  Case discussed with Dr. Julien Nordmann from oncology   We appreciate the consultation   Garner Nash, DO [facility] 01/05/2020 7:21 PM

## 2020-01-05 NOTE — Progress Notes (Signed)
Patient's daughter came to nurse station  stating "my  mum needs to be changed and she has been laying in poop."  Prior to this patient was ambulated to the bathroom at 1925. She was informed by the unit secretary that the tech was busy helping another patient and will help her as soon as she is done with what she was doing. She then requested for the charge nurse. Charge nurse informed patient's daughter that we are 2 nurses and a tech on the unit and her mum will be helped next. Patient's daughter started yelling stating " I want a diaper for my mum". She was informed we don't keep diapers on the unit but still insisted her mum has a diaper on so she wants to find out how the diaper got on her mum. I walked with patient's daughter to patient's room. We both found nurse tech in patient's room doing vitals signs on patient. I inquired from patient how she got the diaper on. She informed me the diaper was put on by OT during therapy.  Patient was cleaned per daughter's request and diaper was taken off. Patient has a smear in her diaper. I informed patient and daughter that if she needs a diaper they are welcome to bring one from home. I also educated the patient's daughter that since patient is ambulatory it will be appropriate for her to call for assistance to use the bathroom rather than using a diaper which poses for skin breakdown. Patient's daughter started yelling requesting to speak to someone above the charge nurse. I told her she can speak to the [place]  or speak to the AD in the morning. But she declined to speak to both stating " I know what to do". She threatened to call news 2 to the hospital. Patient daughter used the F word whiles talking to me. She stated " I am about to punch someone in the face and I don't care if I go to jail for this". Patient's daughter kept living the room multiple times without a mask on , meanwhile I had informed  her its against the hospital policy to be walking in the  hallways without a mask.

## 2020-01-05 NOTE — Consult Note (Addendum)
NAME:  [name], MRN:  [id number], DOB:  1935/09/01, LOS: 0 ADMISSION DATE:  01/04/2020, CONSULTATION DATE:  01/05/2020 REFERRING MD:  Dr. Lorin [name], CHIEF COMPLAINT:  Lung masses  Brief History   85 year old female admitted for workup of near syncope.  Recent finding of 12/27/2019 of bilateral lung masses with mediastinal lymphadenopathy.  Seen by Dr. Julien [name] in office 4/2 with plans for PET scan and CT head.  PCCM consulted for further evaluation of lung masses.    History of present illness    85 year old female with prior history of tobacco abuse, COPD not on home oxygen, CAD, Afib on Xarelto, tachy/ brady syndrome s/p pacemaker, hypothyroidism, HTN, DM and recent COVID in 10/2019, presenting with near syncope episode and central chest pressure/ tightness with radiation into her back.   Patient reports she has not been well for some time.  Reports chronic diarrhea for 2 years and since improved after taking imodium.  However, over the last month, due to worsening watery diarrhea and incontinence at times, she has not been eating like she should.  She has had over 21 lb unintentional weight loss over 1 month and now her dentures do not fit well which further inhibits her eating.  Also reports chest tightness and indigestion with a lot of belching in which she has been taking nexium for for the last two months.  Her chest tightness is affected by deep breathing.  Reports exertional dyspnea and increasing hair loss.  Denies any lower leg swelling, change in chronic cough, hemoptysis, or bloody stools.  She currently lives with one of daughters, [name] and is retired. Works in [employer] and [employer] prior to.  She currently smokes around 6 cigarettes a day, has smoked 0.5- 1 ppd since she was 85 years old.   Recently evaluated in the ER 12/27/2019 for similar chest and abdominal pain.  Underwent CTA of chest / abd/ pelvis which was negative for PE or dissection but showed bilateral lung nodules- LLL  mass 2.1 x 1.7 cm and RUL spiculated mass 3.1 x 2.4 cm, moderate right pleural effusion with atelecasis, mediastinal adenopathy including a 3.0 cm subcarinal lymph node, 2.3 cm precarinal node, and 1.8 prevascular lymph node, and smaller pretracheal nodes; also noted for 1.8 cm right hilar lymph node and 1.1 cm left hilar lymph node.  She followed up with Dr. Earlie [name] with oncology on 4/2 with plans for PET scan and CT head which were scheduled for 4/16 (MRI brain deferred given pacemaker) and feels lesions highly suspicious for metastatic small cell lung cancer.   Yesterday, she did not feel well after doing dishes and had near syncope episode.  CBG 180 with EMS.  In Er, she has been afebrile and hemodynamically stable.  Plans to admit to [facility] for observation for near syncope workup and telemetry monitoring.  Troponin trend flat.  CT head pending.  PCCM consulted for further evaluation/ workup of patient's lung nodules.   Past Medical History  Ongoing tobacco abuse, COPD, CAD, Afib on Xarelto, tachy/ brady syndrome s/p pacemaker, hypothyroidism, HTN, DM  COVID 10/2019  Significant Hospital Events   4/6 admit observation [facility]  Consults:  PCCM  Procedures:   Significant Diagnostic Tests:  12/27/2019 CTA chest/ abd/ pelvis >> No evidence of dissection or aneurysmal dilatation is identified. No evidence of pulmonary emboli. Bilateral lung masses in the right upper and left lower lobe with associated hilar and mediastinal adenopathy consistent with neoplasm. Right-sided pleural effusion is noted  as well. Further workup by means of tissue sampling and PET-CT would be helpful. No acute abnormality in the abdomen is seen. Aortic Atherosclerosis and Emphysema    4/6 CTH >>  Micro Data:  4/6 SARS 2 >>  Antimicrobials:  n/a  Interim history/subjective:   Objective   Blood pressure (!) 137/52, pulse 70, temperature 98.3 F (36.8 C), temperature source Oral, resp. rate 14, height _0  (1.575  m), weight 51.7 kg, SpO2 97 %.       No intake or output data in the 24 hours ending 01/05/20 1412 Filed Weights   01/04/20 1801  Weight: 51.7 kg   Examination: General:  Elderly female lying in bed in NAD HEENT: pupils 3/reactive, edentulous  Neuro: alert, oriented x 3, MAE CV: rr ir, no murmur PULM:  Non labored, speaking full sentences, clear throughout, no wheezes GI: soft, bs+, mild diffuse upper tenderness, no distention Extremities: warm/dry, no edema  Skin: no rashes   Resolved Hospital Problem list     Assessment & Plan:  Bilateral lung masses in current every day smoker ( 49.5 Pack year smoker)   right upper ( 3.1 x 2.4 cm) and left lower lobe ( 2.1 x 1.7 cm) with associated hilar and mediastinal adenopathy consistent with neoplasm. ( 3.0 cm subcarinal lymph node, 2.3 cm precarinal node, 1. 8 cm prevascular lymph node, smaller pretracheal nodes were also seen. Additionally  1.8 cm right hilar lymph node and 1.1 cm left hilar lymph node.) + Right-sided moderate pleural effusion  Plan  - Stop Xarelto now for 48 hour wash out for EBUS 4/9, SCDs for now - If pt is agreeable, Schedule for EBUS Friday 01/08/2020 at 07:30 am per Dr. Valeta [name] for tissue sampling and staging. - Follow up with Dr. Earlie [name] within the next 2 weeks for more detailed discussion of her treatment options based on the final pathology and staging work-up. - Referral to Radiation oncology to eval for palliative radiotherapy to obstructive mass.  - CT Head with and without contrast as scheduled to evaluate for brain  metastasis - PET  Scan as OP to evaluate for metastasis/ complete staging - Smoking cessation counseling/ Education - CXR prn to monitor moderate right sided  effusion >> Consider Lasix as needed  COPD Current every day smoker ( Quit 12/27/2019) Emphysema per imaging 49.5 pack year smoking history No maintenance medications per home medications review Plan Add Scheduled Duonebs Continue prn  albuterol for breakthrough bronchospasm Maintain oxygen sats > 88%   2 Year History of diarrhea/ bowel incontinence Epigastric pain x 2 months  Weight loss>> 21 pounds in the last month per patient Plan Was referred to [facility] as OP. Consider inpatient [facility] Consult  SUMMARY OF TODAY'S PLAN:  Admit for observation per Dr. Lorin [name] Hold Eliquis for EBUS and tissue sampling 4/9 Plan for EBUS 01/08/2020 >> Dr. Valeta [name] for tissue sampling/ staging PET scan for staging Follow up with Dr. Argentina Donovan practice:  DVT PROPHYLAXIS:PAS hose once Eliquis is held SUP: Protonix NUTRITION: Heart smart  MOBILITY: BR GOALS OF CARE:Full code FAMILY DISCUSSIONS: No family at bedside, patient updated on plans DISPOSITION Observation  Labs   CBC: Recent Labs  Lab 01/01/20 0910 01/04/20 1809  WBC 5.2 4.5  NEUTROABS 2.8  --   HGB 10.6* 10.4*  HCT 34.1* 34.2*  MCV 89.7 91.2  PLT 203 009    Basic Metabolic Panel: Recent Labs  Lab 01/01/20 0910 01/04/20 1809  NA 142 136  K 4.2 4.4  CL 107 105  CO2 25 25  GLUCOSE 94 120*  BUN 12 16  CREATININE 0.75 0.89  CALCIUM 9.1 9.0   GFR: Estimated Creatinine Clearance: 38.5 mL/min (by C-G formula based on SCr of 0.89 mg/dL). Recent Labs  Lab 01/01/20 0910 01/04/20 1809  WBC 5.2 4.5    Liver Function Tests: Recent Labs  Lab 01/01/20 0910  AST 13*  ALT 10  ALKPHOS 61  BILITOT 0.3  PROT 7.0  ALBUMIN 3.0*   No results for input(s): LIPASE, AMYLASE in the last 168 hours. No results for input(s): AMMONIA in the last 168 hours.  ABG    Component Value Date/Time   HCO3 26.9 (H) 10/31/2007 1700   TCO2 26 03/12/2010 0833     Coagulation Profile: No results for input(s): INR, PROTIME in the last 168 hours.  Cardiac Enzymes: No results for input(s): CKTOTAL, CKMB, CKMBINDEX, TROPONINI in the last 168 hours.  HbA1C: Hgb A1c MFr Bld  Date/Time Value Ref Range Status  09/30/2019 08:26 AM 6.5 4.6 - 6.5 % Final    Comment:     Glycemic Control Guidelines for People with Diabetes:Non Diabetic:  <6%Goal of Therapy: <7%Additional Action Suggested:  >8%   06/10/2019 09:29 AM 6.2 4.6 - 6.5 % Final    Comment:    Glycemic Control Guidelines for People with Diabetes:Non Diabetic:  <6%Goal of Therapy: <7%Additional Action Suggested:  >8%     CBG: Recent Labs  Lab 01/05/20 0125 01/05/20 1155 01/05/20 1251  GLUCAP 75 62* 90    Review of Systems:   Review of Systems  Constitutional: Positive for weight loss. Negative for chills, fever and malaise/fatigue.  Respiratory: Positive for shortness of breath. Negative for cough, hemoptysis, sputum production and wheezing.        No change in her chronic cough or sputum production Exertional dyspnea   Cardiovascular: Positive for chest pain and leg swelling. Negative for palpitations, orthopnea and claudication.  Gastrointestinal: Positive for abdominal pain and diarrhea. Negative for blood in stool, constipation, melena, nausea and vomiting.  Genitourinary: Negative for hematuria.  Musculoskeletal: Negative for falls.  Skin: Negative for rash.  Neurological: Negative for speech change, focal weakness, loss of consciousness and weakness.  Endo/Heme/Allergies: Positive for environmental allergies.   Past Medical History  She,  has a past medical history of CAD (coronary artery disease), native coronary artery, Cancer ([place]), Cardiac pacemaker in situ (12/24/2015), COPD (chronic obstructive pulmonary disease) ([place]), Diabetes mellitus without complication ([place]), Hypertension, Hypothyroidism (03/23/2010), Pacemaker, and Paroxysmal atrial fibrillation ([place]) (03/23/2010).   Surgical History    Past Surgical History:  Procedure Laterality Date  . CARDIAC CATHETERIZATION N/A 12/26/2015   Procedure: Left Heart Cath and Coronary Angiography;  Surgeon: Peter M [name], MD;  Location: [facility];  Service: Cardiovascular;  Laterality: N/A;  . CARDIOVERSION  05/2015  .  CHOLECYSTECTOMY  2012  . CORONARY ANGIOPLASTY WITH STENT PLACEMENT  1990  . HERNIA REPAIR    . PACEMAKER GENERATOR CHANGE  [id number], 2016  . PACEMAKER INSERTION  1991     Social History   reports that she quit smoking 9 days ago. She has a 49.50 pack-year smoking history. She has never used smokeless tobacco. She reports that she does not drink alcohol or use drugs.   Family History   Her family history includes Cirrhosis in her brother; Heart attack in her mother; Heart disease in her father and mother. There is no history of Colon cancer or Stomach  cancer.   Allergies No Known Allergies   Home Medications  Prior to Admission medications   Medication Sig Start Date End Date Taking? Authorizing Provider  acetaminophen (TYLENOL) 500 MG tablet Take 500 mg by mouth every 6 (six) hours as needed for headache.   Yes [provider]  albuterol (VENTOLIN HFA) 108 (90 Base) MCG/ACT inhaler TAKE 2 PUFFS BY MOUTH EVERY 6 HOURS AS NEEDED FOR WHEEZE OR SHORTNESS OF BREATH*NOT COVERED* Patient taking differently: Inhale 2 puffs into the lungs every 6 (six) hours as needed for wheezing or shortness of breath.  03/30/19  Yes [name], [name], MD  carvedilol (COREG) 12.5 MG tablet TAKE 1 TABLET (12.5 MG TOTAL) BY MOUTH 2 (TWO) TIMES DAILY WITH A MEAL. 10/12/19  Yes Laurey Morale, MD  [employer] NICOTINE TRANSDERMAL SYS 14 MG/24HR patch PLACE 1 PATCH (14 MG TOTAL) ONTO THE SKIN DAILY. 12/26/18  Yes [name], [name], MD  diclofenac sodium (VOLTAREN) 1 % GEL Apply 4 g topically 4 (four) times daily. Patient taking differently: Apply 4 g topically 4 (four) times daily as needed (pain).  11/12/18  Yes [name], [name], MD  dicyclomine (BENTYL) 10 MG capsule TAKE 1 CAPSULE (10 MG TOTAL) BY MOUTH 4 (FOUR) TIMES DAILY - BEFORE MEALS AND AT BEDTIME. 01/04/20  Yes [name], [name], MD  dronedarone (MULTAQ) 400 MG tablet Take 1 tablet (400 mg total) by mouth daily. Patient taking differently: Take 400 mg by mouth daily  after breakfast.  03/13/19  Yes [name], MD  fenofibrate (TRICOR) 48 MG tablet TAKE 1 TABLET BY MOUTH EVERY DAY Patient taking differently: Take 48 mg by mouth daily after lunch.  11/13/19  Yes [name], [name], MD  glucose blood [facility] VERIO) test strip Use to test 3-4 times daily. 09/08/19  Yes [name], [name], MD  levothyroxine (SYNTHROID) 112 MCG tablet TAKE 1 TABLET (112 MCG TOTAL) BY MOUTH DAILY BEFORE BREAKFAST. 07/01/19  Yes [name], [name], MD  linagliptin (TRADJENTA) 5 MG TABS tablet TAKE 1 TABLET (5 MG TOTAL) DAILY BY MOUTH. Patient taking differently: Take 5 mg by mouth daily after breakfast.  12/23/19  Yes [name], [name], MD  losartan (COZAAR) 50 MG tablet Take 2 tablets by mouth daily. Patient taking differently: Take 100 mg by mouth daily.  12/23/19  Yes [name], [name], MD  pantoprazole (PROTONIX) 40 MG tablet Take 1 tablet (40 mg total) by mouth daily. Patient taking differently: Take 40 mg by mouth at bedtime.  12/15/19  Yes [name], [name], MD  potassium chloride SA (KLOR-CON) 20 MEQ tablet 1 tab bid x 2 then 1 tab daily Patient taking differently: Take 20 mEq by mouth at bedtime.  12/15/19 01/15/20 Yes [name], [name], MD  XARELTO 15 MG TABS tablet TAKE 1 TABLET BY MOUTH DAILY WITH SUPPER. Patient taking differently: Take 15 mg by mouth daily with supper.  12/16/19  Yes Camnitz, Will Hassell Done, MD  atorvastatin (LIPITOR) 10 MG tablet Take 1 tablet (10 mg total) by mouth daily. Patient taking differently: Take 10 mg by mouth daily with supper.  01/29/19   Isaiah Serge, NP  nitroGLYCERIN (NITROSTAT) 0.4 MG SL tablet Place 1 tablet (0.4 mg total) under the tongue every 5 (five) minutes x 3 doses as needed for chest pain (Max 3 doses within 15 min. Call 911). Patient not taking: Reported on 01/01/2020 12/30/19   [name], [name], MD       Kennieth Rad, MSN, AGACNP-BC [facility] 01/05/2020, 3:22 PM       PCCM:  85 yo fm, long standing history of  tobacco use, copd, cad, afib on xarelto. Recent ct imaging with concern for lung mass. Has seen Dr. Julien [name] in the outpatient setting with planning outpatient PET scheduled. No tissue diagnosis yet.   She was admitted to obs for syncopal episode. CT head negative.   Pulmonary consulted for recommendations.   BP (!) 130/48 (BP Location: Left Arm)   Pulse 70   Temp 98.6 F (37 C) (Oral)   Resp 18   Ht _0  (1.575 m)   Wt 51.7 kg   LMP  (LMP Unknown)   SpO2 99%   BMI 20.85 kg/m   Gen: elderly fm, resting in bed ENT: tracking appropriately Heart: irregular, s1 s2  Lungs: right sided rhonchi, no wheeze  Labs reviewed  CT chest: right sided hilar mass, associated adenopathy, also contralateral mass concerning for met The patient's images have been independently reviewed by me.    A:  Bilateral lung masses  Mediastinal adenopathy  - concerning for primary bronchogenic carcinoma  Long tobacco abuse history  afib on xarelto   P: Hold xarelto  Bronchoscopy, EBUS with tissue sampling for Friday morning 730 Patient is agreeable to proceed. Discussed risks, benefits and alternatives of proceeding  Case discussed with Dr. Julien [name] from oncology   We appreciate the consultation   Garner Nash, DO [facility] 01/05/2020 7:21 PM

## 2020-01-05 NOTE — ED Notes (Signed)
Pt refusing Covid swab, states she takes blood thinners and her nose bleeds w/any sort of trauma. Informed it is required for admission, is refusing still

## 2020-01-05 NOTE — ED Notes (Signed)
CBG 62. Orange juice given will repeat CBG in 15 min

## 2020-01-05 NOTE — ED Notes (Signed)
Lunch Tray Ordered @ 1052.  

## 2020-01-05 NOTE — ED Notes (Signed)
Pt ambulated to bathroom unassisted.

## 2020-01-06 ENCOUNTER — Observation Stay (HOSPITAL_COMMUNITY): Payer: Medicare Other

## 2020-01-06 ENCOUNTER — Telehealth: Payer: Self-pay | Admitting: Family Medicine

## 2020-01-06 DIAGNOSIS — I351 Nonrheumatic aortic (valve) insufficiency: Secondary | ICD-10-CM

## 2020-01-06 DIAGNOSIS — J90 Pleural effusion, not elsewhere classified: Secondary | ICD-10-CM | POA: Diagnosis not present

## 2020-01-06 DIAGNOSIS — E785 Hyperlipidemia, unspecified: Secondary | ICD-10-CM | POA: Diagnosis present

## 2020-01-06 DIAGNOSIS — E119 Type 2 diabetes mellitus without complications: Secondary | ICD-10-CM | POA: Diagnosis not present

## 2020-01-06 DIAGNOSIS — R55 Syncope and collapse: Secondary | ICD-10-CM | POA: Diagnosis not present

## 2020-01-06 DIAGNOSIS — I495 Sick sinus syndrome: Secondary | ICD-10-CM | POA: Diagnosis present

## 2020-01-06 DIAGNOSIS — R918 Other nonspecific abnormal finding of lung field: Secondary | ICD-10-CM | POA: Diagnosis not present

## 2020-01-06 DIAGNOSIS — R590 Localized enlarged lymph nodes: Secondary | ICD-10-CM | POA: Diagnosis present

## 2020-01-06 DIAGNOSIS — Z8616 Personal history of COVID-19: Secondary | ICD-10-CM | POA: Diagnosis not present

## 2020-01-06 DIAGNOSIS — E11649 Type 2 diabetes mellitus with hypoglycemia without coma: Secondary | ICD-10-CM | POA: Diagnosis not present

## 2020-01-06 DIAGNOSIS — C3490 Malignant neoplasm of unspecified part of unspecified bronchus or lung: Secondary | ICD-10-CM | POA: Diagnosis present

## 2020-01-06 DIAGNOSIS — C3402 Malignant neoplasm of left main bronchus: Secondary | ICD-10-CM | POA: Diagnosis not present

## 2020-01-06 DIAGNOSIS — I480 Paroxysmal atrial fibrillation: Secondary | ICD-10-CM | POA: Diagnosis present

## 2020-01-06 DIAGNOSIS — Z955 Presence of coronary angioplasty implant and graft: Secondary | ICD-10-CM | POA: Diagnosis not present

## 2020-01-06 DIAGNOSIS — Z79890 Hormone replacement therapy (postmenopausal): Secondary | ICD-10-CM | POA: Diagnosis not present

## 2020-01-06 DIAGNOSIS — K529 Noninfective gastroenteritis and colitis, unspecified: Secondary | ICD-10-CM | POA: Diagnosis present

## 2020-01-06 DIAGNOSIS — Z7901 Long term (current) use of anticoagulants: Secondary | ICD-10-CM | POA: Diagnosis not present

## 2020-01-06 DIAGNOSIS — E1169 Type 2 diabetes mellitus with other specified complication: Secondary | ICD-10-CM | POA: Diagnosis present

## 2020-01-06 DIAGNOSIS — I2510 Atherosclerotic heart disease of native coronary artery without angina pectoris: Secondary | ICD-10-CM | POA: Diagnosis present

## 2020-01-06 DIAGNOSIS — Z9049 Acquired absence of other specified parts of digestive tract: Secondary | ICD-10-CM | POA: Diagnosis not present

## 2020-01-06 DIAGNOSIS — R630 Anorexia: Secondary | ICD-10-CM | POA: Diagnosis present

## 2020-01-06 DIAGNOSIS — I110 Hypertensive heart disease with heart failure: Secondary | ICD-10-CM | POA: Diagnosis not present

## 2020-01-06 DIAGNOSIS — I10 Essential (primary) hypertension: Secondary | ICD-10-CM | POA: Diagnosis present

## 2020-01-06 DIAGNOSIS — Z7984 Long term (current) use of oral hypoglycemic drugs: Secondary | ICD-10-CM | POA: Diagnosis not present

## 2020-01-06 DIAGNOSIS — Z8249 Family history of ischemic heart disease and other diseases of the circulatory system: Secondary | ICD-10-CM | POA: Diagnosis not present

## 2020-01-06 DIAGNOSIS — I482 Chronic atrial fibrillation, unspecified: Secondary | ICD-10-CM | POA: Diagnosis not present

## 2020-01-06 DIAGNOSIS — E860 Dehydration: Secondary | ICD-10-CM | POA: Diagnosis not present

## 2020-01-06 DIAGNOSIS — I361 Nonrheumatic tricuspid (valve) insufficiency: Secondary | ICD-10-CM

## 2020-01-06 DIAGNOSIS — Z79899 Other long term (current) drug therapy: Secondary | ICD-10-CM | POA: Diagnosis not present

## 2020-01-06 DIAGNOSIS — Z950 Presence of cardiac pacemaker: Secondary | ICD-10-CM | POA: Diagnosis not present

## 2020-01-06 DIAGNOSIS — W19XXXA Unspecified fall, initial encounter: Secondary | ICD-10-CM | POA: Diagnosis present

## 2020-01-06 DIAGNOSIS — C3401 Malignant neoplasm of right main bronchus: Secondary | ICD-10-CM | POA: Diagnosis not present

## 2020-01-06 DIAGNOSIS — I509 Heart failure, unspecified: Secondary | ICD-10-CM | POA: Diagnosis not present

## 2020-01-06 DIAGNOSIS — E039 Hypothyroidism, unspecified: Secondary | ICD-10-CM | POA: Diagnosis present

## 2020-01-06 LAB — BASIC METABOLIC PANEL
Anion gap: 11 (ref 5–15)
BUN: 13 mg/dL (ref 8–23)
CO2: 22 mmol/L (ref 22–32)
Calcium: 9.1 mg/dL (ref 8.9–10.3)
Chloride: 106 mmol/L (ref 98–111)
Creatinine, Ser: 0.67 mg/dL (ref 0.44–1.00)
GFR calc Af Amer: >60 mL/min (ref >60)
GFR calc non Af Amer: >60 mL/min (ref >60)
Glucose, Bld: 78 mg/dL (ref 70–99)
Potassium: 4.1 mmol/L (ref 3.5–5.1)
Sodium: 139 mmol/L (ref 135–145)

## 2020-01-06 LAB — GLUCOSE, CAPILLARY
Glucose-Capillary: 74 mg/dL (ref 70–99)
Glucose-Capillary: 68 mg/dL — ABNORMAL LOW (ref 70–99)
Glucose-Capillary: 74 mg/dL (ref 70–99)
Glucose-Capillary: 80 mg/dL (ref 70–99)
Glucose-Capillary: 112 mg/dL — ABNORMAL HIGH (ref 70–99)

## 2020-01-06 LAB — CBC
HCT: 34.6 % — ABNORMAL LOW (ref 36.0–46.0)
Hemoglobin: 10.8 g/dL — ABNORMAL LOW (ref 12.0–15.0)
MCH: 28.4 pg (ref 26.0–34.0)
MCHC: 31.2 g/dL (ref 30.0–36.0)
MCV: 91.1 fL (ref 80.0–100.0)
Platelets: 191 10*3/uL (ref 150–400)
RBC: 3.8 MIL/uL — ABNORMAL LOW (ref 3.87–5.11)
RDW: 15.2 % (ref 11.5–15.5)
WBC: 4.9 10*3/uL (ref 4.0–10.5)
nRBC: 0 % (ref 0.0–0.2)

## 2020-01-06 LAB — ECHOCARDIOGRAM COMPLETE
Height: 62 in
Weight: 1798.4 oz

## 2020-01-06 MED ORDER — IPRATROPIUM-ALBUTEROL 0.5-2.5 (3) MG/3ML IN SOLN
3.0000 mL | Freq: Two times a day (BID) | RESPIRATORY_TRACT | Status: DC
  Administered 2020-01-06 – 2020-01-07 (×3): 3 mL via RESPIRATORY_TRACT
  Filled 2020-01-06 (×4): qty 3

## 2020-01-06 NOTE — Progress Notes (Signed)
Patient requests that all information about her care and diagnosis go through her POA, daughter [name] first.  She states that her family went through cancer with her husband and when he was given diagnosis information directly, he became very depressed and "lost his will to live".  For this reason, she is adamant that her daughter [name] be given any diagnostic information or information about the treatment plan first.

## 2020-01-06 NOTE — Progress Notes (Signed)
Subjective: The patient is seen and examined today.  She is feeling a little bit better except for the substernal chest pain and difficulty swallowing.  She denied having any current shortness of breath but has mild cough with no hemoptysis.  She has no fever or chills.  The patient was admitted to the hospital with the weakness as well as dysphagia and chest pain.  Objective: Vital signs in last 24 hours: Temp:  [98.1 F (36.7 C)-98.7 F (37.1 C)] 98.7 F (37.1 C) (04/07 0431) Pulse Rate:  [69-71] 71 (04/07 0431) Resp:  [14-21] 19 (04/07 0431) BP: (115-154)/(19-67) 138/53 (04/07 0431) SpO2:  [95 %-100 %] 95 % (04/07 0815) Weight:  [112 lb 6.4 oz (51 kg)] 112 lb 6.4 oz (51 kg) (04/07 0431)  Intake/Output from previous day: 04/06 0701 - 04/07 0700 In: 293.8 [I.V.:293.8] Out: -  Intake/Output this shift: No intake/output data recorded.  General appearance: alert, cooperative and no distress Resp: clear to auscultation bilaterally Cardio: regular rate and rhythm, S1, S2 normal, no murmur, click, rub or gallop GI: soft, non-tender; bowel sounds normal; no masses,  no organomegaly Extremities: extremities normal, atraumatic, no cyanosis or edema  Lab Results:  Recent Labs    01/04/20 1809 01/06/20 0505  WBC 4.5 4.9  HGB 10.4* 10.8*  HCT 34.2* 34.6*  PLT 213 191   BMET Recent Labs    01/04/20 1809 01/06/20 0505  NA 136 139  K 4.4 4.1  CL 105 106  CO2 25 22  GLUCOSE 120* 78  BUN 16 13  CREATININE 0.89 0.67  CALCIUM 9.0 9.1    Studies/Results: CT HEAD W & WO CONTRAST  Result Date: 01/05/2020 CLINICAL DATA:  Presyncope, lung masses EXAM: CT HEAD WITHOUT AND WITH CONTRAST TECHNIQUE: Contiguous axial images were obtained from the base of the skull through the vertex without and with intravenous contrast CONTRAST:  147mL OMNIPAQUE IOHEXOL 300 MG/ML  SOLN COMPARISON:  None. FINDINGS: Brain: There is no acute intracranial hemorrhage, mass, mass effect, or edema. No abnormal  enhancement. Gray-white differentiation is preserved. There is no extra-axial fluid collection. Patchy hypoattenuation in the supratentorial white matter is nonspecific but may reflect mild chronic microvascular ischemic changes. Ventricles and sulci are within normal limits in size and configuration. Vascular: There is atherosclerotic calcification at the skull base. Skull: Calvarium is unremarkable. Sinuses/Orbits: Mild paranasal sinus mucosal thickening. Hypoplastic left maxillary sinus. Orbits are unremarkable. Other: None. IMPRESSION: No acute intracranial abnormality. No evidence intracranial metastatic disease. Mild chronic microvascular ischemic changes Electronically Signed   By: Macy Mis M.D.   On: 01/05/2020 15:52   DG Chest Portable 1 View  Result Date: 01/05/2020 CLINICAL DATA:  Near syncope. EXAM: PORTABLE CHEST 1 VIEW COMPARISON:  11/07/2018 FINDINGS: There is a multi lead right-sided pacemaker in place. The heart size is stable from prior study. There are aortic calcifications. The previously demonstrated bilateral lung masses are better visualized on prior CT of the chest. Emphysematous changes are noted. There is no pneumothorax. Small bilateral pleural effusions are noted. IMPRESSION: 1. Cardiomegaly with small bilateral pleural effusions. 2. Previously demonstrated lung masses are better visualized on prior CT. 3. Emphysema. Electronically Signed   By: Constance Holster M.D.   On: 01/05/2020 03:16    Medications: I have reviewed the patient's current medications.  Assessment/Plan: This is a very pleasant 85 years old African-American female who was recently presented with bilateral lung masses in the right upper and left lower lobes with associated hilar and mediastinal adenopathy.  The patient was admitted with dysphagia as well as substernal pain and shortness of breath.  This could be secondary to the enlarged mediastinal adenopathy. I consulted Dr. Valeta Harms for consideration of  bronchoscopy and endobronchial ultrasound and biopsy for confirmation of tissue diagnosis. Once the tissue diagnosis is confirmed, will consult radiation oncology for consideration of short course of palliative radiotherapy to the mediastinal mass. The patient has a PET scan scheduled to be done on outpatient basis on 01/15/2020 and hopefully she will be out of the hospital by the time to get her imaging studies done. Thank you for taking good care of Ms. Kasal, I will continue to follow up the patient with you and assist in her management on as-needed basis.  LOS: 0 days    Eilleen Kempf 01/06/2020

## 2020-01-06 NOTE — Progress Notes (Signed)
Echocardiogram 2D Echocardiogram has been performed.  [name] 01/06/2020, 10:15 AM

## 2020-01-06 NOTE — Progress Notes (Signed)
Received call from pt's granddaughter stating that she wanted to speak to the charge nurse. Informed her that I am the charge nurse. Granddaughter stated that she wanted to complain about the pt's care last night and stated "that [facility]ac[facility] Pontes was about to be on [employer] news [place]". Stated that the pt was left in poop and "did not feel safe". Then stated that if the pt was not transferred to a different unit that they would take the patient home Against Medical Advise. Advised granddaughter that the incidence that happened last night was unintentional and that the patient is resting comfortablly. Granddaughter stated her mother would be coming up to [facility] shortly. I went into pt's room and asked her about last night. Pt stated that she was in bed and sneezed and had a small accident in her brief. She ambulated herself to the toilet and had a bowel movement. Ambulated herself back to her bed and called out for a new brief. When she was told that the unit didn't carry briefs, she put her old brief back on. I asked the patient if she felt safe in our care and she stated that she did.  Called [employer]. 56M Assistant Director came by and I discussed the situation with her. AD went into pt's room with daughter and spoke with them. Will continue to monitor.

## 2020-01-06 NOTE — Telephone Encounter (Signed)
FYI sent to PCP.

## 2020-01-06 NOTE — Progress Notes (Signed)
Hypoglycemic Event  CBG: 64  Treatment: 4 oz juice/soda  Symptoms: None  Follow-up CBG: Time:1139 CBG Result: 74  Possible Reasons for Event: Inadequate meal intake  Comments/MD notified:Yes    Joni Reining

## 2020-01-06 NOTE — Plan of Care (Signed)
  Problem: Education: Goal: Knowledge of General Education information will improve Description: Including pain rating scale, medication(s)/side effects and non-pharmacologic comfort measures Outcome: Progressing  Problem: Health Behavior/Discharge Planning: Goal: Ability to manage health-related needs will improve Outcome: Progressing    Patient able to verbalize respiratory symptoms such as mucus secretions related to condition, lung biopsy planned for Friday.  Problem: Clinical Measurements: Goal: Will remain free from infection Outcome: Progressing  Patient afebrile at this time.  Problem: Activity: Goal: Risk for activity intolerance will decrease Outcome: Progressing  Patient transferring from chair to bed, denies SOB.

## 2020-01-06 NOTE — Progress Notes (Signed)
PROGRESS NOTE    [name]  MHD:[id number] DOB: 1935/04/07 DOA: 01/04/2020 PCP: [name], [name], MD    Brief Narrative:  [name] is a 85 y.o. female with medical history significant of  chronic afib; pacemaker placement; hypothyroidism; HTN; DM; COPD; and CAD presenting with near syncope.  She had a similar ER presentation on 3/28 and CTA for dissection showed RLL mass 2.1 x 1.7 cm and RUL mass 3.1 x 2.4 cm with mediastinal LAD.  She was seen by Dr. Julien Nordmann on 4/2 for "highly suspicious metastatic lung cancer", likely SCC.  She will need a PET and CT head (no MRI due to pacemaker), and referral to CT surgery or pulmonology for biopsy.  Patient felt weak and dizzy.  She thinks she blacked out.  Patient also has history of weight loss and poor appetite for last few months.  ED Course:  Chest pressure, tightness with radiation into back.  Similar to prior, new B lung masses seen by Dr. Julien Nordmann.  Last night with pain and felt like she would pass out.  Symptoms resolved.  Glucose 180s.  No h/o echo, has pacer.  Work up reassuring.   Assessment & Plan:   Principal Problem:   Near syncope Active Problems:   Hypothyroidism   CAD (coronary artery disease), native coronary artery   Paroxysmal atrial fibrillation (HCC)   COPD (chronic obstructive pulmonary disease) (HCC)   Cardiac pacemaker in situ   Type 2 diabetes mellitus with other specified complication (HCC)   Tobacco use disorder   Hypertension with heart disease   Hyperlipidemia associated with type 2 diabetes mellitus (HCC)   Chronic diarrhea  Near syncope: Likely orthostatic from dehydration, cancer induced anorexia.  Check orthostatic vital signs.  Fall precautions.  Continue IV fluids today for hydration.  Unlikely cardiac events.  Metastatic lung cancer: Presumed.  Head CT scan negative.  MRI could not be done due to because of the pacemaker.  40-pack-year smoking.  Seen by oncology and pulmonary.  Xarelto stopped, last dose  4/6, scheduled for endoscopy ultrasound, bronc biopsy for 4/9. Continue chest physiotherapy, incentive spirometry, flutter valve therapy. PET scan scheduled for 4/16.  Paroxysmal A. fib: Rate controlled.  Patient on Multaq that is continued.  Xarelto on hold.  Hypothyroidism: Euthyroid on current doses.  Continue.  COPD: Not in exacerbation.  Continue as needed bronchodilator therapy.  Hypertension: Blood pressure stable on Coreg and Cozaar.  Continue.  Type 2 diabetes: Well controlled.  A1c 6.5.  Hypoglycemic with poor appetite.  Holding Tradjenta.  On sliding scale insulin.  Patient admitted with recurrent symptoms of syncope, dehydration, significant new diagnosis of lung cancer that will need work-up, invasive testing and inpatient biopsy to expedient diagnosis and treatment.   DVT prophylaxis: SCDs Code Status: Full code Family Communication: Patient Disposition Plan: patient is from home. Anticipated DC to home, Barriers to discharge needing procedure in the hospital.  Scheduled for 4/9 after Xarelto washout.   Consultants:   Pulmonary  Oncology  Procedures:   None  Antimicrobials:   None   Subjective: Patient seen and examined.  No overnight events.  She has some cough with mucoid sputum production.  Appetite is poor.  She was able to stand up with no dizziness. Telemetry shows paced rhythm and regular.  Objective: Vitals:   01/06/20 0431 01/06/20 0815 01/06/20 1000 01/06/20 1212  BP: (!) 138/53  (!) 150/63 (!) 146/62  Pulse: 71  70 70  Resp: 19  18 16   Temp: 98.7  F (37.1 C)  98.3 F (36.8 C) 98 F (36.7 C)  TempSrc: Oral  Oral Oral  SpO2: 100% 95% 99% 97%  Weight: 51 kg     Height:        Intake/Output Summary (Last 24 hours) at 01/06/2020 1253 Last data filed at 01/05/2020 1600 Gross per 24 hour  Intake 293.75 ml  Output --  Net 293.75 ml   Filed Weights   01/04/20 1801 01/06/20 0431  Weight: 51.7 kg 51 kg    Examination:  General exam:  Appears calm and comfortable, on room air.  Sitting in couch. Respiratory system: Clear to auscultation. Respiratory effort normal.  Upper airway sounds. Cardiovascular system: S1 & S2 heard, RRR. No JVD, murmurs, rubs, gallops or clicks.  Trace pedal edema.  Pacemaker present left precordium. Gastrointestinal system: Abdomen is nondistended, soft and nontender. No organomegaly or masses felt. Normal bowel sounds heard. Central nervous system: Alert and oriented. No focal neurological deficits. Extremities: Symmetric 5 x 5 power. Skin: No rashes, lesions or ulcers Psychiatry: Judgement and insight appear normal. Mood & affect appropriate.     Data Reviewed: I have personally reviewed following labs and imaging studies  CBC: Recent Labs  Lab 01/01/20 0910 01/04/20 1809 01/06/20 0505  WBC 5.2 4.5 4.9  NEUTROABS 2.8  --   --   HGB 10.6* 10.4* 10.8*  HCT 34.1* 34.2* 34.6*  MCV 89.7 91.2 91.1  PLT 203 213 397   Basic Metabolic Panel: Recent Labs  Lab 01/01/20 0910 01/04/20 1809 01/06/20 0505  NA 142 136 139  K 4.2 4.4 4.1  CL 107 105 106  CO2 25 25 22   GLUCOSE 94 120* 78  BUN 12 16 13   CREATININE 0.75 0.89 0.67  CALCIUM 9.1 9.0 9.1   GFR: Estimated Creatinine Clearance: 42.9 mL/min (by C-G formula based on SCr of 0.67 mg/dL). Liver Function Tests: Recent Labs  Lab 01/01/20 0910  AST 13*  ALT 10  ALKPHOS 61  BILITOT 0.3  PROT 7.0  ALBUMIN 3.0*   No results for input(s): LIPASE, AMYLASE in the last 168 hours. No results for input(s): AMMONIA in the last 168 hours. Coagulation Profile: No results for input(s): INR, PROTIME in the last 168 hours. Cardiac Enzymes: No results for input(s): CKTOTAL, CKMB, CKMBINDEX, TROPONINI in the last 168 hours. BNP (last 3 results) No results for input(s): PROBNP in the last 8760 hours. HbA1C: No results for input(s): HGBA1C in the last 72 hours. CBG: Recent Labs  Lab 01/05/20 1633 01/05/20 2138 01/06/20 0601  01/06/20 1119 01/06/20 1139  GLUCAP 77 81 74 68* 74   Lipid Profile: No results for input(s): CHOL, HDL, LDLCALC, TRIG, CHOLHDL, LDLDIRECT in the last 72 hours. Thyroid Function Tests: Recent Labs    01/05/20 1232  TSH 0.951   Anemia Panel: No results for input(s): VITAMINB12, FOLATE, FERRITIN, TIBC, IRON, RETICCTPCT in the last 72 hours. Sepsis Labs: No results for input(s): PROCALCITON, LATICACIDVEN in the last 168 hours.  No results found for this or any previous visit (from the past 240 hour(s)).       Radiology Studies: CT HEAD W & WO CONTRAST  Result Date: 01/05/2020 CLINICAL DATA:  Presyncope, lung masses EXAM: CT HEAD WITHOUT AND WITH CONTRAST TECHNIQUE: Contiguous axial images were obtained from the base of the skull through the vertex without and with intravenous contrast CONTRAST:  171mL OMNIPAQUE IOHEXOL 300 MG/ML  SOLN COMPARISON:  None. FINDINGS: Brain: There is no acute intracranial hemorrhage, mass, mass effect,  or edema. No abnormal enhancement. Gray-white differentiation is preserved. There is no extra-axial fluid collection. Patchy hypoattenuation in the supratentorial white matter is nonspecific but may reflect mild chronic microvascular ischemic changes. Ventricles and sulci are within normal limits in size and configuration. Vascular: There is atherosclerotic calcification at the skull base. Skull: Calvarium is unremarkable. Sinuses/Orbits: Mild paranasal sinus mucosal thickening. Hypoplastic left maxillary sinus. Orbits are unremarkable. Other: None. IMPRESSION: No acute intracranial abnormality. No evidence intracranial metastatic disease. Mild chronic microvascular ischemic changes Electronically Signed   By: Macy Mis M.D.   On: 01/05/2020 15:52   DG Chest Portable 1 View  Result Date: 01/05/2020 CLINICAL DATA:  Near syncope. EXAM: PORTABLE CHEST 1 VIEW COMPARISON:  11/07/2018 FINDINGS: There is a multi lead right-sided pacemaker in place. The heart size is  stable from prior study. There are aortic calcifications. The previously demonstrated bilateral lung masses are better visualized on prior CT of the chest. Emphysematous changes are noted. There is no pneumothorax. Small bilateral pleural effusions are noted. IMPRESSION: 1. Cardiomegaly with small bilateral pleural effusions. 2. Previously demonstrated lung masses are better visualized on prior CT. 3. Emphysema. Electronically Signed   By: Constance Holster M.D.   On: 01/05/2020 03:16   ECHOCARDIOGRAM COMPLETE  Result Date: 01/06/2020    ECHOCARDIOGRAM REPORT   Patient Name:   JAYCEE MCKELLIPS Date of Exam: 01/06/2020 Medical Rec #:  [id number]   Height:       62.0 in Accession #:    [id number]  Weight:       112.4 lb Date of Birth:  1935/03/12   BSA:          1.496 m Patient Age:    49 years    BP:           138/53 mmHg Patient Gender: F           HR:           70 bpm. Exam Location:  Inpatient Procedure: 2D Echo, Color Doppler and Cardiac Doppler Indications:    R55 Syncope  History:        Patient has prior history of Echocardiogram examinations, most                 recent 03/10/2017. CAD, Pacemaker, COPD, Arrythmias:Atrial                 Fibrillation; Risk Factors:Hypertension, Diabetes and                 Dyslipidemia.  Sonographer:    Raquel Sarna Senior RDCS Referring Phys: [place]  1. Left ventricular ejection fraction, by estimation, is 55 to 60%. The left ventricle has normal function. The left ventricle has no regional wall motion abnormalities. Left ventricular diastolic parameters are consistent with Grade I diastolic dysfunction (impaired relaxation).  2. Right ventricular systolic function is normal. The right ventricular size is normal. There is mildly elevated pulmonary artery systolic pressure. The estimated right ventricular systolic pressure is 01.7 mmHg.  3. Left atrial size was severely dilated.  4. Right atrial size was mildly dilated.  5. The mitral valve is abnormal. Trivial  mitral valve regurgitation.  6. The aortic valve is tricuspid. Aortic valve regurgitation is mild. Mild aortic valve sclerosis is present, with no evidence of aortic valve stenosis. Aortic regurgitation PHT measures 350 msec.  7. The inferior vena cava is normal in size with greater than 50% respiratory variability, suggesting right atrial pressure of 3 mmHg. FINDINGS  Left Ventricle: Left ventricular ejection fraction, by estimation, is 55 to 60%. The left ventricle has normal function. The left ventricle has no regional wall motion abnormalities. The left ventricular internal cavity size was normal in size. There is  no left ventricular hypertrophy. Left ventricular diastolic parameters are consistent with Grade I diastolic dysfunction (impaired relaxation). Indeterminate filling pressures. Right Ventricle: The right ventricular size is normal. No increase in right ventricular wall thickness. Right ventricular systolic function is normal. There is mildly elevated pulmonary artery systolic pressure. The tricuspid regurgitant velocity is 2.61  m/s, and with an assumed right atrial pressure of 3 mmHg, the estimated right ventricular systolic pressure is 54.6 mmHg. Left Atrium: Left atrial size was severely dilated. Right Atrium: Right atrial size was mildly dilated. Pericardium: There is no evidence of pericardial effusion. Mitral Valve: The mitral valve is abnormal. Mild mitral annular calcification. Trivial mitral valve regurgitation. Tricuspid Valve: The tricuspid valve is grossly normal. Tricuspid valve regurgitation is mild. Aortic Valve: The aortic valve is tricuspid. Aortic valve regurgitation is mild. Aortic regurgitation PHT measures 350 msec. Mild aortic valve sclerosis is present, with no evidence of aortic valve stenosis. Pulmonic Valve: The pulmonic valve was grossly normal. Pulmonic valve regurgitation is trivial. Aorta: The aortic root and ascending aorta are structurally normal, with no evidence of  dilitation. Venous: The inferior vena cava is normal in size with greater than 50% respiratory variability, suggesting right atrial pressure of 3 mmHg. IAS/Shunts: No atrial level shunt detected by color flow Doppler. Additional Comments: A pacer wire is visualized.  LEFT VENTRICLE PLAX 2D LVIDd:         5.00 cm  Diastology LVIDs:         3.70 cm  LV e' lateral:   9.68 cm/s LV PW:         1.00 cm  LV E/e' lateral: 6.4 LV IVS:        1.00 cm  LV e' medial:    6.09 cm/s LVOT diam:     1.90 cm  LV E/e' medial:  10.2 LV SV:         52 LV SV Index:   35 LVOT Area:     2.84 cm  RIGHT VENTRICLE RV S prime:     8.81 cm/s TAPSE (M-mode): 2.0 cm LEFT ATRIUM             Index       RIGHT ATRIUM           Index LA diam:        3.90 cm 2.61 cm/m  RA Area:     14.30 cm LA Vol (A2C):   74.7 ml 49.92 ml/m RA Volume:   30.70 ml  20.52 ml/m LA Vol (A4C):   90.2 ml 60.28 ml/m LA Biplane Vol: 82.9 ml 55.40 ml/m  AORTIC VALVE LVOT Vmax:   84.20 cm/s LVOT Vmean:  55.200 cm/s LVOT VTI:    0.183 m AI PHT:      350 msec  AORTA Ao Root diam: 2.80 cm Ao Asc diam:  3.00 cm MITRAL VALVE               TRICUSPID VALVE MV Area (PHT): 2.91 cm    TR Peak grad:   27.2 mmHg MV Decel Time: 261 msec    TR Vmax:        261.00 cm/s MV E velocity: 62.10 cm/s MV A velocity: 49.30 cm/s  SHUNTS MV E/A ratio:  1.26  Systemic VTI:  0.18 m                            Systemic Diam: 1.90 cm Lyman Bishop MD Electronically signed by Lyman Bishop MD Signature Date/Time: 01/06/2020/11:27:11 AM    Final         Scheduled Meds: . atorvastatin  10 mg Oral Q supper  . carvedilol  12.5 mg Oral BID WC  . dicyclomine  10 mg Oral TID AC & HS  . dronedarone  400 mg Oral QPC breakfast  . fenofibrate  54 mg Oral Daily  . insulin aspart  0-9 Units Subcutaneous TID WC  . ipratropium-albuterol  3 mL Nebulization BID  . levothyroxine  112 mcg Oral QAC breakfast  . losartan  100 mg Oral Daily  . nicotine  14 mg Transdermal Daily  . pantoprazole  40 mg  Oral QHS  . potassium chloride SA  20 mEq Oral QHS  . sodium chloride flush  3 mL Intravenous Q12H   Continuous Infusions: . lactated ringers 75 mL/hr at 01/06/20 0518     LOS: 0 days    Time spent: 25 minutes    Barb Merino, MD [facility] Pager [phone]

## 2020-01-06 NOTE — Telephone Encounter (Signed)
Pt's daughter, [name], stated that her mother wanted to inform her PCP that she has been admitted into the hospital. She had to call 911 this past Monday due to her body going limp and in & out conscious, as well as, the pt stating she felt funny. Her blood sugar was 185 and when the EMT check it went to 110 to 75 to 60 (steady dropping). At the ED, they admitted her for observation and testing. They performed a CAT scan yesterday and found cancer in the brain. [name] stated that her mother doesn't remember what was going on before 911 was called and was feeling fine before she started to feel funny.

## 2020-01-07 LAB — GLUCOSE, CAPILLARY
Glucose-Capillary: 86 mg/dL (ref 70–99)
Glucose-Capillary: 90 mg/dL (ref 70–99)
Glucose-Capillary: 100 mg/dL — ABNORMAL HIGH (ref 70–99)
Glucose-Capillary: 109 mg/dL — ABNORMAL HIGH (ref 70–99)

## 2020-01-07 NOTE — Progress Notes (Signed)
PT Progress Note for Charges    01/07/20 1357  PT Visit Information  Last PT Received On 01/07/20  PT General Charges  $$ ACUTE PT VISIT 1 Visit  PT Treatments  $Gait Training 8-22 mins  Anastasio Champion, DPT  Acute Rehabilitation Services Pager [phone] Office [phone]

## 2020-01-07 NOTE — Progress Notes (Signed)
Physical Therapy Treatment Patient Details Name: [name] MRN: [id number] DOB: 14-Jul-1935 Today's Date: 01/07/2020    History of Present Illness Pt is an 85 y/o female admitted secondary to near syncope likely orthostatic from dehydration and cancer induced anorexia. Pt with recent admission that found lung masses. PMH includes CAD s/p stent, s/p pacemaker, a fib, COPD, DM, HTN, and tobacco use. Scheduled for endoscopy, ultrasound, bronc biopsy 01/08/20    PT Comments    Luci Bellucci is making progress with functional mobility and activity tolerance. She ambulated ~200' (100' with IV pole, 100' no AD) with min guard for safety as she demonstrates occasional staggering (L & R) and two instances of LOB but pt is able to independently recover using step strategy.  Pt tolerated additional exercises of 5x sit to stand from chair w/o UE use. Pt would continue to benefit from skilled physical therapy services at this time while admitted and after d/c to address the below listed limitations in order to improve overall safety and independence with functional mobility and balance.    Follow Up Recommendations  No PT follow up;Supervision for mobility/OOB     Equipment Recommendations  None recommended by PT    Recommendations for Other Services       Precautions / Restrictions Precautions Precautions: Fall Restrictions Weight Bearing Restrictions: No    Mobility  Bed Mobility Overal bed mobility: Modified Independent Bed Mobility: Supine to Sit     Supine to sit: Modified independent (Device/Increase time)        Transfers Overall transfer level: Needs assistance Equipment used: None Transfers: Sit to/from Stand Sit to Stand: Supervision         General transfer comment: close supervision for safety  Ambulation/Gait Ambulation/Gait assistance: Min guard Gait Distance (Feet): 200 Feet Assistive device: IV Pole;None Gait Pattern/deviations: Step-through pattern;Drifts  right/left Gait velocity: Decreased   General Gait Details: mild unsteadiness with 2 LOB instances noted, pt able to independently recover with step srtategy. Reaches out for object to steady self on (IV pole). Ambulated 100' with IV pole and 100' w/o   Stairs             Wheelchair Mobility    Modified Rankin (Stroke Patients Only)       Balance Overall balance assessment: Needs assistance Sitting-balance support: Feet supported;No upper extremity supported Sitting balance-Leahy Scale: Good     Standing balance support: No upper extremity supported;Single extremity supported;During functional activity Standing balance-Leahy Scale: Fair Standing balance comment: quiet standing balance is good, dynamic balance is fair, occasional LOB noted, able to independently recover                            Cognition Arousal/Alertness: Awake/alert Behavior During Therapy: WFL for tasks assessed/performed Overall Cognitive Status: Within Functional Limits for tasks assessed                                        Exercises Other Exercises Other Exercises: 5x Sit to Stands at chair    General Comments        Pertinent Vitals/Pain Pain Assessment: No/denies pain    Home Living                      Prior Function            PT Goals (current goals  can now be found in the care plan section) Acute Rehab PT Goals Patient Stated Goal: to go home PT Goal Formulation: With patient Time For Goal Achievement: 01/19/20 Potential to Achieve Goals: Good Progress towards PT goals: Progressing toward goals    Frequency    Min 3X/week      PT Plan Current plan remains appropriate    Co-evaluation              AM-PAC PT "6 Clicks" Mobility   Outcome Measure  Help needed turning from your back to your side while in a flat bed without using bedrails?: None Help needed moving from lying on your back to sitting on the side of a  flat bed without using bedrails?: None Help needed moving to and from a bed to a chair (including a wheelchair)?: A Little Help needed standing up from a chair using your arms (e.g., wheelchair or bedside chair)?: None Help needed to walk in hospital room?: A Little Help needed climbing 3-5 steps with a railing? : A Little 6 Click Score: 21    End of Session Equipment Utilized During Treatment: Gait belt Activity Tolerance: Patient tolerated treatment well Patient left: in chair;with call bell/phone within reach Nurse Communication: Mobility status PT Visit Diagnosis: Other abnormalities of gait and mobility (R26.89);Muscle weakness (generalized) (M62.81)     Time: 1638-4665 PT Time Calculation (min) (ACUTE ONLY): 16 min  Charges:  $Gait Training: 8-22 mins                    Soniyah Mcglory, SPT Acute Rehab  [phone]  Zaylon Bossier 01/07/2020, 2:01 PM

## 2020-01-07 NOTE — Progress Notes (Signed)
PROGRESS NOTE    [name]  ZOX:[id number] DOB: [birth year] DOA: 01/04/2020 PCP: [name], [name], MD    Brief Narrative:  [name] is a 85 y.o. female with medical history significant of  chronic afib; pacemaker placement; hypothyroidism; HTN; DM; COPD; and CAD presenting with near syncope.  She had a similar ER presentation on 3/28 and CTA for dissection showed RLL mass 2.1 x 1.7 cm and RUL mass 3.1 x 2.4 cm with mediastinal LAD.  She was seen by Dr. Julien Nordmann on 4/2 for "highly suspicious metastatic lung cancer", likely SCC.  She will need a PET and CT head (no MRI due to pacemaker), and referral to CT surgery or pulmonology for biopsy.  Patient felt weak and dizzy.  She thinks she blacked out.  Patient also has history of weight loss and poor appetite for last few months.  ED Course:  Chest pressure, tightness with radiation into back.  Similar to prior, new B lung masses seen by Dr. Julien Nordmann.  Last night with pain and felt like she would pass out.  Symptoms resolved.  Glucose 180s.  No h/o echo, has pacer.  Work up reassuring.   Assessment & Plan:   Principal Problem:   Near syncope Active Problems:   Hypothyroidism   CAD (coronary artery disease), native coronary artery   Paroxysmal atrial fibrillation (HCC)   COPD (chronic obstructive pulmonary disease) (HCC)   Cardiac pacemaker in situ   Type 2 diabetes mellitus with other specified complication (HCC)   Tobacco use disorder   Hypertension with heart disease   Hyperlipidemia associated with type 2 diabetes mellitus (HCC)   Chronic diarrhea   Lung cancer ([place])  Near syncope: Likely orthostatic from dehydration, cancer induced anorexia. Negative orthostatic vital signs.  Fall precautions.  Continue IV fluids today for hydration.  Unlikely cardiac events.  Metastatic lung cancer: Presumed.  Head CT scan negative.  MRI could not be done due to because of the pacemaker.  40-pack-year smoking.  Seen by oncology and pulmonary.   Xarelto stopped, last dose 4/6, scheduled for endoscopy ultrasound, bronc biopsy for 4/9. Continue chest physiotherapy, incentive spirometry, flutter valve therapy. PET scan scheduled for 4/16.  Paroxysmal A. fib: Rate controlled.  Patient on Multaq that is continued.  Xarelto on hold.  Hypothyroidism: Euthyroid on current doses.  Continue.  COPD: Not in exacerbation.  Continue as needed bronchodilator therapy.  Hypertension: Blood pressure stable on Coreg and Cozaar.  Continue.  Type 2 diabetes: Well controlled.  A1c 6.5. blood sugars better today. Holding Tradjenta.  On sliding scale insulin.  Patient admitted with recurrent symptoms of syncope, dehydration, significant new diagnosis of lung cancer that will need work-up, invasive testing and inpatient biopsy to expedient diagnosis and treatment.   DVT prophylaxis: SCDs Code Status: Full code Family Communication: Patient, pulmonary talking to family  Disposition Plan: patient is from home. Anticipated DC to home, Barriers to discharge needing procedure in the hospital.  Scheduled for 4/9 after Xarelto washout.   Consultants:   Pulmonary  Oncology  Procedures:   None  Antimicrobials:   None   Subjective: Patient seen and examined.  No overnight events.  No more dizziness.  Objective: Vitals:   01/06/20 1945 01/06/20 2012 01/07/20 0600 01/07/20 0754  BP: 117/61  (!) 126/54   Pulse: 70  70   Resp: 16  16   Temp: 98 F (36.7 C)  98 F (36.7 C)   TempSrc: Oral  Oral   SpO2:  100% 98%  99%  Weight:      Height:        Intake/Output Summary (Last 24 hours) at 01/07/2020 1008 Last data filed at 01/07/2020 0900 Gross per 24 hour  Intake 3575.49 ml  Output --  Net 3575.49 ml   Filed Weights   01/04/20 1801 01/06/20 0431  Weight: 51.7 kg 51 kg    Examination:  General exam: Appears calm and comfortable, on room air.  Sleeping after breakfast. Respiratory system: Clear to auscultation. Respiratory effort  normal.  Upper airway sounds. Cardiovascular system: S1 & S2 heard, RRR. No JVD, murmurs, rubs, gallops or clicks.  Trace pedal edema.  Pacemaker present left precordium. Gastrointestinal system: Abdomen is nondistended, soft and nontender. No organomegaly or masses felt. Normal bowel sounds heard. Central nervous system: Alert and oriented. No focal neurological deficits. Extremities: Symmetric 5 x 5 power. Skin: No rashes, lesions or ulcers Psychiatry: Judgement and insight appear normal. Mood & affect appropriate.     Data Reviewed: I have personally reviewed following labs and imaging studies  CBC: Recent Labs  Lab 01/01/20 0910 01/04/20 1809 01/06/20 0505  WBC 5.2 4.5 4.9  NEUTROABS 2.8  --   --   HGB 10.6* 10.4* 10.8*  HCT 34.1* 34.2* 34.6*  MCV 89.7 91.2 91.1  PLT 203 213 412   Basic Metabolic Panel: Recent Labs  Lab 01/01/20 0910 01/04/20 1809 01/06/20 0505  NA 142 136 139  K 4.2 4.4 4.1  CL 107 105 106  CO2 25 25 22   GLUCOSE 94 120* 78  BUN 12 16 13   CREATININE 0.75 0.89 0.67  CALCIUM 9.1 9.0 9.1   GFR: Estimated Creatinine Clearance: 42.9 mL/min (by C-G formula based on SCr of 0.67 mg/dL). Liver Function Tests: Recent Labs  Lab 01/01/20 0910  AST 13*  ALT 10  ALKPHOS 61  BILITOT 0.3  PROT 7.0  ALBUMIN 3.0*   No results for input(s): LIPASE, AMYLASE in the last 168 hours. No results for input(s): AMMONIA in the last 168 hours. Coagulation Profile: No results for input(s): INR, PROTIME in the last 168 hours. Cardiac Enzymes: No results for input(s): CKTOTAL, CKMB, CKMBINDEX, TROPONINI in the last 168 hours. BNP (last 3 results) No results for input(s): PROBNP in the last 8760 hours. HbA1C: No results for input(s): HGBA1C in the last 72 hours. CBG: Recent Labs  Lab 01/06/20 1119 01/06/20 1139 01/06/20 1625 01/06/20 2136 01/07/20 0603  GLUCAP 68* 74 80 112* 86   Lipid Profile: No results for input(s): CHOL, HDL, LDLCALC, TRIG, CHOLHDL,  LDLDIRECT in the last 72 hours. Thyroid Function Tests: Recent Labs    01/05/20 1232  TSH 0.951   Anemia Panel: No results for input(s): VITAMINB12, FOLATE, FERRITIN, TIBC, IRON, RETICCTPCT in the last 72 hours. Sepsis Labs: No results for input(s): PROCALCITON, LATICACIDVEN in the last 168 hours.  No results found for this or any previous visit (from the past 240 hour(s)).       Radiology Studies: CT HEAD W & WO CONTRAST  Result Date: 01/05/2020 CLINICAL DATA:  Presyncope, lung masses EXAM: CT HEAD WITHOUT AND WITH CONTRAST TECHNIQUE: Contiguous axial images were obtained from the base of the skull through the vertex without and with intravenous contrast CONTRAST:  166mL OMNIPAQUE IOHEXOL 300 MG/ML  SOLN COMPARISON:  None. FINDINGS: Brain: There is no acute intracranial hemorrhage, mass, mass effect, or edema. No abnormal enhancement. Gray-white differentiation is preserved. There is no extra-axial fluid collection. Patchy hypoattenuation in the supratentorial white matter is nonspecific  but may reflect mild chronic microvascular ischemic changes. Ventricles and sulci are within normal limits in size and configuration. Vascular: There is atherosclerotic calcification at the skull base. Skull: Calvarium is unremarkable. Sinuses/Orbits: Mild paranasal sinus mucosal thickening. Hypoplastic left maxillary sinus. Orbits are unremarkable. Other: None. IMPRESSION: No acute intracranial abnormality. No evidence intracranial metastatic disease. Mild chronic microvascular ischemic changes Electronically Signed   By: Macy Mis M.D.   On: 01/05/2020 15:52   ECHOCARDIOGRAM COMPLETE  Result Date: 01/06/2020    ECHOCARDIOGRAM REPORT   Patient Name:   RETTA PITCHER Date of Exam: 01/06/2020 Medical Rec #:  [id number]   Height:       62.0 in Accession #:    [id number]  Weight:       112.4 lb Date of Birth:  28-Jun-1935   BSA:          1.496 m Patient Age:    61 years    BP:           138/53 mmHg Patient Gender:  F           HR:           70 bpm. Exam Location:  Inpatient Procedure: 2D Echo, Color Doppler and Cardiac Doppler Indications:    R55 Syncope  History:        Patient has prior history of Echocardiogram examinations, most                 recent 03/10/2017. CAD, Pacemaker, COPD, Arrythmias:Atrial                 Fibrillation; Risk Factors:Hypertension, Diabetes and                 Dyslipidemia.  Sonographer:    Raquel Sarna Senior RDCS Referring Phys: [place]  1. Left ventricular ejection fraction, by estimation, is 55 to 60%. The left ventricle has normal function. The left ventricle has no regional wall motion abnormalities. Left ventricular diastolic parameters are consistent with Grade I diastolic dysfunction (impaired relaxation).  2. Right ventricular systolic function is normal. The right ventricular size is normal. There is mildly elevated pulmonary artery systolic pressure. The estimated right ventricular systolic pressure is 61.9 mmHg.  3. Left atrial size was severely dilated.  4. Right atrial size was mildly dilated.  5. The mitral valve is abnormal. Trivial mitral valve regurgitation.  6. The aortic valve is tricuspid. Aortic valve regurgitation is mild. Mild aortic valve sclerosis is present, with no evidence of aortic valve stenosis. Aortic regurgitation PHT measures 350 msec.  7. The inferior vena cava is normal in size with greater than 50% respiratory variability, suggesting right atrial pressure of 3 mmHg. FINDINGS  Left Ventricle: Left ventricular ejection fraction, by estimation, is 55 to 60%. The left ventricle has normal function. The left ventricle has no regional wall motion abnormalities. The left ventricular internal cavity size was normal in size. There is  no left ventricular hypertrophy. Left ventricular diastolic parameters are consistent with Grade I diastolic dysfunction (impaired relaxation). Indeterminate filling pressures. Right Ventricle: The right ventricular size  is normal. No increase in right ventricular wall thickness. Right ventricular systolic function is normal. There is mildly elevated pulmonary artery systolic pressure. The tricuspid regurgitant velocity is 2.61  m/s, and with an assumed right atrial pressure of 3 mmHg, the estimated right ventricular systolic pressure is 50.9 mmHg. Left Atrium: Left atrial size was severely dilated. Right Atrium: Right atrial size was mildly dilated. Pericardium: There  is no evidence of pericardial effusion. Mitral Valve: The mitral valve is abnormal. Mild mitral annular calcification. Trivial mitral valve regurgitation. Tricuspid Valve: The tricuspid valve is grossly normal. Tricuspid valve regurgitation is mild. Aortic Valve: The aortic valve is tricuspid. Aortic valve regurgitation is mild. Aortic regurgitation PHT measures 350 msec. Mild aortic valve sclerosis is present, with no evidence of aortic valve stenosis. Pulmonic Valve: The pulmonic valve was grossly normal. Pulmonic valve regurgitation is trivial. Aorta: The aortic root and ascending aorta are structurally normal, with no evidence of dilitation. Venous: The inferior vena cava is normal in size with greater than 50% respiratory variability, suggesting right atrial pressure of 3 mmHg. IAS/Shunts: No atrial level shunt detected by color flow Doppler. Additional Comments: A pacer wire is visualized.  LEFT VENTRICLE PLAX 2D LVIDd:         5.00 cm  Diastology LVIDs:         3.70 cm  LV e' lateral:   9.68 cm/s LV PW:         1.00 cm  LV E/e' lateral: 6.4 LV IVS:        1.00 cm  LV e' medial:    6.09 cm/s LVOT diam:     1.90 cm  LV E/e' medial:  10.2 LV SV:         52 LV SV Index:   35 LVOT Area:     2.84 cm  RIGHT VENTRICLE RV S prime:     8.81 cm/s TAPSE (M-mode): 2.0 cm LEFT ATRIUM             Index       RIGHT ATRIUM           Index LA diam:        3.90 cm 2.61 cm/m  RA Area:     14.30 cm LA Vol (A2C):   74.7 ml 49.92 ml/m RA Volume:   30.70 ml  20.52 ml/m LA Vol  (A4C):   90.2 ml 60.28 ml/m LA Biplane Vol: 82.9 ml 55.40 ml/m  AORTIC VALVE LVOT Vmax:   84.20 cm/s LVOT Vmean:  55.200 cm/s LVOT VTI:    0.183 m AI PHT:      350 msec  AORTA Ao Root diam: 2.80 cm Ao Asc diam:  3.00 cm MITRAL VALVE               TRICUSPID VALVE MV Area (PHT): 2.91 cm    TR Peak grad:   27.2 mmHg MV Decel Time: 261 msec    TR Vmax:        261.00 cm/s MV E velocity: 62.10 cm/s MV A velocity: 49.30 cm/s  SHUNTS MV E/A ratio:  1.26        Systemic VTI:  0.18 m                            Systemic Diam: 1.90 cm Lyman Bishop MD Electronically signed by Lyman Bishop MD Signature Date/Time: 01/06/2020/11:27:11 AM    Final         Scheduled Meds: . atorvastatin  10 mg Oral Q supper  . carvedilol  12.5 mg Oral BID WC  . dicyclomine  10 mg Oral TID AC & HS  . dronedarone  400 mg Oral QPC breakfast  . fenofibrate  54 mg Oral Daily  . insulin aspart  0-9 Units Subcutaneous TID WC  . ipratropium-albuterol  3 mL Nebulization BID  . levothyroxine  112 mcg Oral QAC breakfast  . losartan  100 mg Oral Daily  . nicotine  14 mg Transdermal Daily  . pantoprazole  40 mg Oral QHS  . potassium chloride SA  20 mEq Oral QHS  . sodium chloride flush  3 mL Intravenous Q12H   Continuous Infusions: . lactated ringers 75 mL/hr at 01/06/20 1941     LOS: 1 day    Time spent: 25 minutes    Barb Merino, MD [facility] Pager [phone]

## 2020-01-08 ENCOUNTER — Encounter (HOSPITAL_COMMUNITY)
Admission: EM | Disposition: A | Discharge status: Home / Self Care | Payer: Self-pay | Source: Home / Self Care | Attending: Internal Medicine

## 2020-01-08 ENCOUNTER — Other Ambulatory Visit: Payer: Self-pay

## 2020-01-08 ENCOUNTER — Inpatient Hospital Stay (HOSPITAL_COMMUNITY): Payer: Medicare Other | Admitting: Anesthesiology

## 2020-01-08 ENCOUNTER — Encounter (HOSPITAL_COMMUNITY): Payer: Self-pay | Admitting: Internal Medicine

## 2020-01-08 HISTORY — PX: FINE NEEDLE ASPIRATION BIOPSY: CATH118315

## 2020-01-08 HISTORY — PX: ENDOBRONCHIAL ULTRASOUND: SHX5096

## 2020-01-08 HISTORY — PX: BIOPSY: SHX5522

## 2020-01-08 HISTORY — PX: VIDEO BRONCHOSCOPY WITH ENDOBRONCHIAL ULTRASOUND: SHX6177

## 2020-01-08 HISTORY — PX: BRONCHIAL WASHINGS: SHX5105

## 2020-01-08 HISTORY — PX: BRONCHIAL BRUSHINGS: SHX5108

## 2020-01-08 LAB — GLUCOSE, CAPILLARY
Glucose-Capillary: 81 mg/dL (ref 70–99)
Glucose-Capillary: 101 mg/dL — ABNORMAL HIGH (ref 70–99)
Glucose-Capillary: 114 mg/dL — ABNORMAL HIGH (ref 70–99)

## 2020-01-08 SURGERY — BRONCHOSCOPY, WITH EBUS
Anesthesia: General

## 2020-01-08 MED ORDER — ONDANSETRON HCL 4 MG/2ML IJ SOLN: 4.0000 mg | Freq: Once | INTRAMUSCULAR | Status: DC | PRN

## 2020-01-08 MED ORDER — PHENYLEPHRINE HCL-NACL 10-0.9 MG/250ML-% IV SOLN
INTRAVENOUS | Status: DC | PRN
  Administered 2020-01-08: 50 ug/min via INTRAVENOUS

## 2020-01-08 MED ORDER — PHENYLEPHRINE HCL (PRESSORS) 10 MG/ML IV SOLN
INTRAVENOUS | Status: DC | PRN
  Administered 2020-01-08: 80 ug via INTRAVENOUS
  Administered 2020-01-08: 40 ug via INTRAVENOUS

## 2020-01-08 MED ORDER — FENTANYL CITRATE (PF) 100 MCG/2ML IJ SOLN: 25.0000 ug | INTRAMUSCULAR | Status: DC | PRN

## 2020-01-08 MED ORDER — LIDOCAINE 2% (20 MG/ML) 5 ML SYRINGE
INTRAMUSCULAR | Status: DC | PRN
  Administered 2020-01-08: 60 mg via INTRAVENOUS

## 2020-01-08 MED ORDER — PHENYLEPHRINE 40 MCG/ML (10ML) SYRINGE FOR IV PUSH (FOR BLOOD PRESSURE SUPPORT)
PREFILLED_SYRINGE | INTRAVENOUS | Status: DC | PRN
  Administered 2020-01-08: 120 ug via INTRAVENOUS

## 2020-01-08 MED ORDER — PROPOFOL 10 MG/ML IV BOLUS
INTRAVENOUS | Status: DC | PRN
  Administered 2020-01-08: 120 mg via INTRAVENOUS
  Administered 2020-01-08: 50 mg via INTRAVENOUS
  Administered 2020-01-08: 30 mg via INTRAVENOUS

## 2020-01-08 MED ORDER — LIDOCAINE HCL 1 % IJ SOLN
INTRAMUSCULAR | Status: DC | PRN
  Administered 2020-01-08: 10 mL

## 2020-01-08 MED ORDER — ONDANSETRON HCL 4 MG/2ML IJ SOLN
INTRAMUSCULAR | Status: DC | PRN
  Administered 2020-01-08: 4 mg via INTRAVENOUS

## 2020-01-08 SURGICAL SUPPLY — 30 items

## 2020-01-08 NOTE — Op Note (Addendum)
Video Bronchoscopy with Endobronchial Ultrasound, endobronchial biopsy procedure Note  Date of Operation: 01/08/2020  Pre-op Diagnosis: Lung Mass   Post-op Diagnosis: Lung Mass   Surgeon: Garner Nash, DO  Assistants: None   Anesthesia: General endotracheal anesthesia  Operation: Flexible video fiberoptic bronchoscopy with endobronchial ultrasound and biopsies.  Estimated Blood Loss: Minimal, <0QT  Complications: None   Indications and History: [name] is a 85 y.o. female with lung mass, mediastinal adenopathy.  The risks, benefits, complications, treatment options and expected outcomes were discussed with the patient.  The possibilities of pneumothorax, pneumonia, reaction to medication, pulmonary aspiration, perforation of a viscus, bleeding, failure to diagnose a condition and creating a complication requiring transfusion or operation were discussed with the patient who freely signed the consent.    Description of Procedure: The patient was examined in the preoperative area and history and data from the preprocedure consultation were reviewed. It was deemed appropriate to proceed.  The patient was taken to [facility] endoscopy room 2, identified as [name] and the procedure verified as Flexible Video Fiberoptic Bronchoscopy.  A Time Out was held and the above information confirmed. After being taken to the operating room general anesthesia was initiate and an LMA was placed. The video fiberoptic bronchoscope was introduced via the LMA along with 10 cc of 1% lidocaine used to topicalized the vocal cords, main trachea and bilateral mainstem's.  A general inspection was performed which showed normal right and left lung anatomy however there was visible submucosal tumor growth coming from the bilateral medial sides of the main carina, more dense accumulation of tumor along the right mainstem medial wall. The standard scope was then withdrawn and the endobronchial ultrasound was used  to identify and characterize the peritracheal, hilar and bronchial lymph nodes. Inspection showed enlarged station 7 lymph node. Using real-time ultrasound guidance Wang needle biopsies were take from Station 7 nodes and were sent for cytology. The patient tolerated the procedure well without apparent complications.  Following the endobronchial ultrasound component of the procedure we switched to a standard flexible bronchoscope.  Following the endobronchial ultrasound there was a large fully occluding right mainstem clot.  This was extracted using forceps and en bloc suctioning with en bloc retrieval.  The right mainstem blood clot was approximately 3.5 inches long.  The bronchoscope was used for therapeutic suctioning and relief of this obstruction.  We then used 2 mm endobronchial forceps for biopsies of the submucosal tumor growth on the right mainstem, cytology brushings as well as washings of the right mainstem were collected.  There was no significant blood loss. The bronchoscope was withdrawn. Anesthesia was reversed and the patient was taken to the PACU for recovery.   Samples: 1. Wang needle biopsies from 7 node 2.  Endobronchial right mainstem forcep biopsies 3.  Endobronchial cytology brushings of the right mainstem 4.  Bronchial washings of the right mainstem for cytology  Plans:  The patient will be discharged from the PACU to home when recovered from anesthesia. We will review the cytology, pathology results with the patient when they become available. Outpatient followup will be with Octavio Graves Malik Ruffino, DO.   Preliminary pathology: Adequate for tissue diagnosis  Recommendations: Keep scheduled oncology follow ups Keep OP PET that is scheduled  See me in 3 weeks in clinic [place] to restart [place], DO [facility] 01/08/2020 8:52 AM

## 2020-01-08 NOTE — Discharge Summary (Signed)
Physician Discharge Summary  [name] QPY:[id number] DOB: [birth year] DOA: 01/04/2020  PCP: [name], [name], MD  Admit date: 01/04/2020 Discharge date: 01/08/2020  Admitted From: Home Disposition: Home  Recommendations for Outpatient Follow-up:  1. Follow up with PCP in 1-2 weeks 2. Follow-up with oncology as scheduled.  Pulmonology will call you with results.  Discharge Condition: Stable CODE STATUS: Full code Diet recommendation: Low-salt diet  Discharge summary:  [name] a 85 y.o.femalewith medical history significant of chronic afib; pacemaker placement; hypothyroidism; HTN; DM; COPD; and CAD presented with near syncope. She had a similar ER presentation on 3/28 and CTA for dissection showed RLL mass 2.1 x 1.7 cm and RUL mass 3.1 x 2.4 cm with mediastinal LAD. She was seen by Dr. Julien Nordmann on 4/2 for "highly suspicious metastatic lung cancer", likely SCC. Patient felt weak and dizzy.  She thinks she blacked out.  Patient also has history of weight loss and poor appetite for last few months.  ED Course:clinically dry. Symptoms resolved. Glucose 180s. Work up reassuring.  Admitted to hospital for monitoring and treatment.  #1. near syncope: Likely orthostatic from dehydration, cancer induced anorexia.  Orthostatics were negative after admission.  Unlikely cardiac events.  Largely remained stable.  Currently mobilizing without orthostatic changes or recurrence of symptoms.  #2. metastatic lung cancer presumed: CT head negative.  MRI could not be done because of pacemaker.  40-pack-year smoking history, quit last week.  Seen by hematology oncology last week and plan for PET scan on 4/16.  Due to significant symptoms, underwent bronchoscopy and biopsy today by pulmonology.  Results will be called.  Clinical evidence of metastatic lung cancer.  Other chronic medical issue remained stable.  She will go home.  Hydrate herself very well.  Will follow up with oncology next week with  PET scan.  Discharge Diagnoses:  Principal Problem:   Near syncope Active Problems:   Hypothyroidism   CAD (coronary artery disease), native coronary artery   Paroxysmal atrial fibrillation (HCC)   COPD (chronic obstructive pulmonary disease) (HCC)   Cardiac pacemaker in situ   Type 2 diabetes mellitus with other specified complication (HCC)   Tobacco use disorder   Hypertension with heart disease   Hyperlipidemia associated with type 2 diabetes mellitus ([name])   Chronic diarrhea   Lung cancer [facility])    Discharge Instructions  Discharge Instructions    Diet - low sodium heart healthy   Complete by: As directed    Discharge instructions   Complete by: As directed    Follow up with test next week as scheduled  Start taking Xarelto tomorrow 4/10   Increase activity slowly   Complete by: As directed      Allergies as of 01/08/2020   No Known Allergies     Medication List    TAKE these medications   acetaminophen 500 MG tablet Commonly known as: TYLENOL Take 500 mg by mouth every 6 (six) hours as needed for headache.   albuterol 108 (90 Base) MCG/ACT inhaler Commonly known as: VENTOLIN HFA TAKE 2 PUFFS BY MOUTH EVERY 6 HOURS AS NEEDED FOR WHEEZE OR SHORTNESS OF BREATH*NOT COVERED* What changed: See the new instructions.   atorvastatin 10 MG tablet Commonly known as: LIPITOR Take 1 tablet (10 mg total) by mouth daily. What changed: when to take this   carvedilol 12.5 MG tablet Commonly known as: COREG TAKE 1 TABLET (12.5 MG TOTAL) BY MOUTH 2 (TWO) TIMES DAILY WITH A MEAL.   [employer] NICOTINE TRANSDERMAL  SYS 14 mg/24hr patch Generic drug: nicotine PLACE 1 PATCH (14 MG TOTAL) ONTO THE SKIN DAILY.   diclofenac sodium 1 % Gel Commonly known as: VOLTAREN Apply 4 g topically 4 (four) times daily. What changed:   when to take this  reasons to take this   dicyclomine 10 MG capsule Commonly known as: BENTYL TAKE 1 CAPSULE (10 MG TOTAL) BY MOUTH 4 (FOUR) TIMES DAILY -  BEFORE MEALS AND AT BEDTIME.   dronedarone 400 MG tablet Commonly known as: MULTAQ Take 1 tablet (400 mg total) by mouth daily. What changed: when to take this   fenofibrate 48 MG tablet Commonly known as: TRICOR TAKE 1 TABLET BY MOUTH EVERY DAY What changed: when to take this   levothyroxine 112 MCG tablet Commonly known as: SYNTHROID TAKE 1 TABLET (112 MCG TOTAL) BY MOUTH DAILY BEFORE BREAKFAST.   linagliptin 5 MG Tabs tablet Commonly known as: Tradjenta TAKE 1 TABLET (5 MG TOTAL) DAILY BY MOUTH. What changed:   how much to take  how to take this  when to take this  additional instructions   losartan 50 MG tablet Commonly known as: COZAAR Take 2 tablets by mouth daily. What changed:   how much to take  how to take this  when to take this  additional instructions   nitroGLYCERIN 0.4 MG SL tablet Commonly known as: NITROSTAT Place 1 tablet (0.4 mg total) under the tongue every 5 (five) minutes x 3 doses as needed for chest pain (Max 3 doses within 15 min. Call 911).   OneTouch Verio test strip Generic drug: glucose blood Use to test 3-4 times daily.   pantoprazole 40 MG tablet Commonly known as: PROTONIX Take 1 tablet (40 mg total) by mouth daily. What changed: when to take this   potassium chloride SA 20 MEQ tablet Commonly known as: KLOR-CON 1 tab bid x 2 then 1 tab daily What changed:   how much to take  how to take this  when to take this  additional instructions   Xarelto 15 MG Tabs tablet Generic drug: Rivaroxaban TAKE 1 TABLET BY MOUTH DAILY WITH SUPPER. What changed: See the new instructions.      Follow-up Information    Icard, Bradley L, DO In 3 weeks.   Specialty: Pulmonary Disease Contact information: [address] [place] [address] [phone]          No Known Allergies  Consultations:  Pulmonary  Oncology   Procedures/Studies: CT HEAD W & WO CONTRAST  Result Date: 01/05/2020 CLINICAL DATA:   Presyncope, lung masses EXAM: CT HEAD WITHOUT AND WITH CONTRAST TECHNIQUE: Contiguous axial images were obtained from the base of the skull through the vertex without and with intravenous contrast CONTRAST:  124mL OMNIPAQUE IOHEXOL 300 MG/ML  SOLN COMPARISON:  None. FINDINGS: Brain: There is no acute intracranial hemorrhage, mass, mass effect, or edema. No abnormal enhancement. Gray-white differentiation is preserved. There is no extra-axial fluid collection. Patchy hypoattenuation in the supratentorial white matter is nonspecific but may reflect mild chronic microvascular ischemic changes. Ventricles and sulci are within normal limits in size and configuration. Vascular: There is atherosclerotic calcification at the skull base. Skull: Calvarium is unremarkable. Sinuses/Orbits: Mild paranasal sinus mucosal thickening. Hypoplastic left maxillary sinus. Orbits are unremarkable. Other: None. IMPRESSION: No acute intracranial abnormality. No evidence intracranial metastatic disease. Mild chronic microvascular ischemic changes Electronically Signed   By: Macy Mis M.D.   On: 01/05/2020 15:52   DG Chest Portable 1  View  Result Date: 01/05/2020 CLINICAL DATA:  Near syncope. EXAM: PORTABLE CHEST 1 VIEW COMPARISON:  11/07/2018 FINDINGS: There is a multi lead right-sided pacemaker in place. The heart size is stable from prior study. There are aortic calcifications. The previously demonstrated bilateral lung masses are better visualized on prior CT of the chest. Emphysematous changes are noted. There is no pneumothorax. Small bilateral pleural effusions are noted. IMPRESSION: 1. Cardiomegaly with small bilateral pleural effusions. 2. Previously demonstrated lung masses are better visualized on prior CT. 3. Emphysema. Electronically Signed   By: Constance Holster M.D.   On: 01/05/2020 03:16   DG Abdomen Acute W/Chest  Result Date: 12/27/2019 CLINICAL DATA:  Epigastric abdominal pain radiating to back, diarrhea  EXAM: DG ABDOMEN ACUTE W/ 1V CHEST COMPARISON:  11/28/2016 FINDINGS: Supine and upright frontal views of the abdomen as well as an upright frontal view of the chest are obtained. The cardiac silhouette is unremarkable. There is mild central vascular congestion and background emphysema. No airspace disease, effusion, or pneumothorax. Bowel gas pattern is unremarkable without obstruction or ileus. Scattered gas fluid levels extending through the transverse colon are nonspecific, and could relate to the patient's complaint of diarrhea. No masses or abnormal calcifications. No free gas in the greater peritoneal sac. Postsurgical changes right hip. IMPRESSION: 1. Colonic gas fluid levels are nonspecific, but could relate to the patient's diarrhea. 2. No bowel obstruction or ileus. 3. Background emphysema, no acute intrathoracic process. Electronically Signed   By: Randa Ngo M.D.   On: 12/27/2019 20:02   ECHOCARDIOGRAM COMPLETE  Result Date: 01/06/2020    ECHOCARDIOGRAM REPORT   Patient Name:   [name] Date of Exam: 01/06/2020 Medical Rec #:  [id number]   Height:       62.0 in Accession #:    [id number]  Weight:       112.4 lb Date of Birth:  02-24-1935   BSA:          1.496 m Patient Age:    61 years    BP:           138/53 mmHg Patient Gender: F           HR:           70 bpm. Exam Location:  Inpatient Procedure: 2D Echo, Color Doppler and Cardiac Doppler Indications:    R55 Syncope  History:        Patient has prior history of Echocardiogram examinations, most                 recent 03/10/2017. CAD, Pacemaker, COPD, Arrythmias:Atrial                 Fibrillation; Risk Factors:Hypertension, Diabetes and                 Dyslipidemia.  Sonographer:    Raquel Sarna Senior RDCS Referring Phys: [place]  1. Left ventricular ejection fraction, by estimation, is 55 to 60%. The left ventricle has normal function. The left ventricle has no regional wall motion abnormalities. Left ventricular diastolic  parameters are consistent with Grade I diastolic dysfunction (impaired relaxation).  2. Right ventricular systolic function is normal. The right ventricular size is normal. There is mildly elevated pulmonary artery systolic pressure. The estimated right ventricular systolic pressure is 17.4 mmHg.  3. Left atrial size was severely dilated.  4. Right atrial size was mildly dilated.  5. The mitral valve is abnormal. Trivial mitral valve regurgitation.  6. The  aortic valve is tricuspid. Aortic valve regurgitation is mild. Mild aortic valve sclerosis is present, with no evidence of aortic valve stenosis. Aortic regurgitation PHT measures 350 msec.  7. The inferior vena cava is normal in size with greater than 50% respiratory variability, suggesting right atrial pressure of 3 mmHg. FINDINGS  Left Ventricle: Left ventricular ejection fraction, by estimation, is 55 to 60%. The left ventricle has normal function. The left ventricle has no regional wall motion abnormalities. The left ventricular internal cavity size was normal in size. There is  no left ventricular hypertrophy. Left ventricular diastolic parameters are consistent with Grade I diastolic dysfunction (impaired relaxation). Indeterminate filling pressures. Right Ventricle: The right ventricular size is normal. No increase in right ventricular wall thickness. Right ventricular systolic function is normal. There is mildly elevated pulmonary artery systolic pressure. The tricuspid regurgitant velocity is 2.61  m/s, and with an assumed right atrial pressure of 3 mmHg, the estimated right ventricular systolic pressure is 84.1 mmHg. Left Atrium: Left atrial size was severely dilated. Right Atrium: Right atrial size was mildly dilated. Pericardium: There is no evidence of pericardial effusion. Mitral Valve: The mitral valve is abnormal. Mild mitral annular calcification. Trivial mitral valve regurgitation. Tricuspid Valve: The tricuspid valve is grossly normal.  Tricuspid valve regurgitation is mild. Aortic Valve: The aortic valve is tricuspid. Aortic valve regurgitation is mild. Aortic regurgitation PHT measures 350 msec. Mild aortic valve sclerosis is present, with no evidence of aortic valve stenosis. Pulmonic Valve: The pulmonic valve was grossly normal. Pulmonic valve regurgitation is trivial. Aorta: The aortic root and ascending aorta are structurally normal, with no evidence of dilitation. Venous: The inferior vena cava is normal in size with greater than 50% respiratory variability, suggesting right atrial pressure of 3 mmHg. IAS/Shunts: No atrial level shunt detected by color flow Doppler. Additional Comments: A pacer wire is visualized.  LEFT VENTRICLE PLAX 2D LVIDd:         5.00 cm  Diastology LVIDs:         3.70 cm  LV e' lateral:   9.68 cm/s LV PW:         1.00 cm  LV E/e' lateral: 6.4 LV IVS:        1.00 cm  LV e' medial:    6.09 cm/s LVOT diam:     1.90 cm  LV E/e' medial:  10.2 LV SV:         52 LV SV Index:   35 LVOT Area:     2.84 cm  RIGHT VENTRICLE RV S prime:     8.81 cm/s TAPSE (M-mode): 2.0 cm LEFT ATRIUM             Index       RIGHT ATRIUM           Index LA diam:        3.90 cm 2.61 cm/m  RA Area:     14.30 cm LA Vol (A2C):   74.7 ml 49.92 ml/m RA Volume:   30.70 ml  20.52 ml/m LA Vol (A4C):   90.2 ml 60.28 ml/m LA Biplane Vol: 82.9 ml 55.40 ml/m  AORTIC VALVE LVOT Vmax:   84.20 cm/s LVOT Vmean:  55.200 cm/s LVOT VTI:    0.183 m AI PHT:      350 msec  AORTA Ao Root diam: 2.80 cm Ao Asc diam:  3.00 cm MITRAL VALVE               TRICUSPID VALVE  MV Area (PHT): 2.91 cm    TR Peak grad:   27.2 mmHg MV Decel Time: 261 msec    TR Vmax:        261.00 cm/s MV E velocity: 62.10 cm/s MV A velocity: 49.30 cm/s  SHUNTS MV E/A ratio:  1.26        Systemic VTI:  0.18 m                            Systemic Diam: 1.90 cm Lyman Bishop MD Electronically signed by Lyman Bishop MD Signature Date/Time: 01/06/2020/11:27:11 AM    Final    CT Angio Chest/Abd/Pel  for Dissection W and/or Wo Contrast  Result Date: 12/28/2019 CLINICAL DATA:  Chest and abdominal pain EXAM: CT ANGIOGRAPHY CHEST, ABDOMEN AND PELVIS TECHNIQUE: Multidetector CT imaging through the chest, abdomen and pelvis was performed using the standard protocol during bolus administration of intravenous contrast. Multiplanar reconstructed images and MIPs were obtained and reviewed to evaluate the vascular anatomy. CONTRAST:  124mL OMNIPAQUE IOHEXOL 350 MG/ML SOLN COMPARISON:  11/28/2016 CT abdomen and pelvis and plain film exam from earlier in the same day. FINDINGS: CTA CHEST FINDINGS Cardiovascular: Thoracic aorta and its branches demonstrate atherosclerotic calcifications with a normal branching pattern. No aneurysmal dilatation or dissection is seen. Pulmonary artery shows a normal branching pattern. No filling defects are identified to suggest pulmonary embolism. Coronary calcifications are seen. Mild cardiac enlargement is noted. Pacing device is noted as well. Considerable venous collateralization is noted along the right chest wall likely related to the prior pacing device. Mediastinum/Nodes: Thoracic inlet is within normal limits. The esophagus as visualized is unremarkable. Diffuse lymphadenopathy is identified within the mediastinum. A 3.0 cm subcarinal node is noted. A precarinal node which measures at least 2.3 cm is seen and a pre-vascular lymph node mass which measures 1.8 cm is noted. Smaller pretracheal nodes are seen. Additionally there is a 18 mm right hilar lymph node and 11 mm left hilar lymph node identified. Some smaller scattered hilar nodes are noted as well. Lungs/Pleura: Emphysematous changes are seen. Additionally there is a mass lesion in the left lower lobe which measures approximately 2.1 by 1.7 cm consistent with a primary pulmonary neoplasm till proven otherwise. In the right upper lobe there is a somewhat spiculated lesion identified best seen on image number 49 of series 8  along the lateral aspect of the mediastinum projecting in the right upper lobe. This measures 3.1 x 2.4 cm and is also consistent with pulmonary neoplasm till proven otherwise. Moderate right-sided pleural effusion is seen as well as some right lower lobe atelectasis. Musculoskeletal: Degenerative changes of the thoracic spine are seen. No acute rib abnormality is noted. Review of the MIP images confirms the above findings. CTA ABDOMEN AND PELVIS FINDINGS VASCULAR Aorta: Abdominal aorta demonstrates atherosclerotic calcifications. No evidence of aneurysmal dilatation or dissection is seen. Celiac: Atherosclerotic calcifications are noted although no focal stenosis of the celiac axis is seen. SMA: Atherosclerotic calcifications are noted without focal stenosis. Renals: Heavy atherosclerotic calcifications are seen within single renal arteries bilaterally. IMA: Patent without evidence of aneurysm, dissection, vasculitis or significant stenosis. Iliacs: Diffuse vascular calcifications are seen without aneurysmal dilatation. Veins: No specific venous abnormality is noted. Review of the MIP images confirms the above findings. NON-VASCULAR Hepatobiliary: Gallbladder has been surgically removed. Liver appears within normal limits. Pancreas: Unremarkable. No pancreatic ductal dilatation or surrounding inflammatory changes. Spleen: Normal in size without focal abnormality. Adrenals/Urinary  Tract: Adrenal glands are within normal limits. Normal enhancement of the kidneys is seen bilaterally. No renal calculi is are seen. Stable left renal cyst is noted. The bladder is well distended. Stomach/Bowel: No obstructive or inflammatory changes of the colon are seen. The appendix is within normal limits. Small bowel and stomach appear unremarkable. Lymphatic: No significant lymphadenopathy is seen. Reproductive: Uterus and bilateral adnexa are unremarkable. Other: No abdominal wall hernia or abnormality. No abdominopelvic ascites.  Musculoskeletal: Postsurgical changes in the proximal right femur are seen. Degenerative changes of the lumbar spine are noted. Degenerative changes about the sacroiliac joints are seen as well. No lytic or sclerotic lesions are seen. Review of the MIP images confirms the above findings. IMPRESSION: No evidence of dissection or aneurysmal dilatation is identified. No evidence of pulmonary emboli. Bilateral lung masses in the right upper and left lower lobe with associated hilar and mediastinal adenopathy consistent with neoplasm. Right-sided pleural effusion is noted as well. Further workup by means of tissue sampling and PET-CT would be helpful. No acute abnormality in the abdomen is seen. Aortic Atherosclerosis (ICD10-I70.0) and Emphysema (ICD10-J43.9). Electronically Signed   By: Inez Catalina M.D.   On: 12/28/2019 00:09     Subjective: Patient was seen and examined.  Came back from procedure.  After she came back from procedure she had some chest tightness that improved after eating and taking Tylenol. In the evening, she was seen for discharge readiness.  She denied any complaints.  She is eager to go home.   Discharge Exam: Vitals:   01/08/20 0943 01/08/20 1228  BP: (!) 129/49 (!) 117/46  Pulse: 77 70  Resp: 20 20  Temp: 98.6 F (37 C) 98.4 F (36.9 C)  SpO2: 97%    Vitals:   01/08/20 0929 01/08/20 0930 01/08/20 0943 01/08/20 1228  BP: (!) 123/49  (!) 129/49 (!) 117/46  Pulse: 70 70 77 70  Resp: (!) 24 (!) 25 20 20   Temp: 97.7 F (36.5 C)  98.6 F (37 C) 98.4 F (36.9 C)  TempSrc:   Oral Oral  SpO2: 93% 93% 97%   Weight:      Height:        General: Pt is alert, awake, not in acute distress, on room air. Cardiovascular: irregularly irregular.  S1/S2 +, no rubs, no gallops, pacemaker on right precordium. Respiratory: CTA bilaterally, no wheezing, no rhonchi Abdominal: Soft, NT, ND, bowel sounds + Extremities: no edema, no cyanosis    The results of significant  diagnostics from this hospitalization (including imaging, microbiology, ancillary and laboratory) are listed below for reference.     Microbiology: No results found for this or any previous visit (from the past 240 hour(s)).   Labs: BNP (last 3 results) No results for input(s): BNP in the last 8760 hours. Basic Metabolic Panel: Recent Labs  Lab 01/04/20 1809 01/06/20 0505  NA 136 139  K 4.4 4.1  CL 105 106  CO2 25 22  GLUCOSE 120* 78  BUN 16 13  CREATININE 0.89 0.67  CALCIUM 9.0 9.1   Liver Function Tests: No results for input(s): AST, ALT, ALKPHOS, BILITOT, PROT, ALBUMIN in the last 168 hours. No results for input(s): LIPASE, AMYLASE in the last 168 hours. No results for input(s): AMMONIA in the last 168 hours. CBC: Recent Labs  Lab 01/04/20 1809 01/06/20 0505  WBC 4.5 4.9  HGB 10.4* 10.8*  HCT 34.2* 34.6*  MCV 91.2 91.1  PLT 213 191   Cardiac Enzymes: No results  for input(s): CKTOTAL, CKMB, CKMBINDEX, TROPONINI in the last 168 hours. BNP: Invalid input(s): POCBNP CBG: Recent Labs  Lab 01/07/20 1623 01/07/20 2133 01/08/20 0642 01/08/20 0902 01/08/20 1136  GLUCAP 100* 109* 81 101* 114*   D-Dimer No results for input(s): DDIMER in the last 72 hours. Hgb A1c No results for input(s): HGBA1C in the last 72 hours. Lipid Profile No results for input(s): CHOL, HDL, LDLCALC, TRIG, CHOLHDL, LDLDIRECT in the last 72 hours. Thyroid function studies No results for input(s): TSH, T4TOTAL, T3FREE, THYROIDAB in the last 72 hours.  Invalid input(s): FREET3 Anemia work up No results for input(s): VITAMINB12, FOLATE, FERRITIN, TIBC, IRON, RETICCTPCT in the last 72 hours. Urinalysis    Component Value Date/Time   COLORURINE YELLOW 01/05/2020 0631   APPEARANCEUR HAZY (A) 01/05/2020 0631   LABSPEC 1.023 01/05/2020 0631   PHURINE 5.0 01/05/2020 0631   GLUCOSEU NEGATIVE 01/05/2020 0631   HGBUR NEGATIVE 01/05/2020 0631   BILIRUBINUR NEGATIVE 01/05/2020 0631    KETONESUR NEGATIVE 01/05/2020 0631   PROTEINUR 30 (A) 01/05/2020 0631   NITRITE NEGATIVE 01/05/2020 0631   LEUKOCYTESUR NEGATIVE 01/05/2020 0631   Sepsis Labs Invalid input(s): PROCALCITONIN,  WBC,  LACTICIDVEN Microbiology No results found for this or any previous visit (from the past 240 hour(s)).   Time coordinating discharge: 35 minutes  SIGNED:   Barb Merino, MD  [facility] 01/08/2020, 3:40 PM

## 2020-01-08 NOTE — Progress Notes (Signed)
Patient is back from endoscopy, VS stable, pt c/o 8/10 heaviness on right chest and headache. MD notified,. Will continue to nonitor patient.

## 2020-01-08 NOTE — Interval H&P Note (Signed)
History and Physical Interval Note:  01/08/2020 7:28 AM  [name]  has presented today for surgery, with the diagnosis of lung mass.  The various methods of treatment have been discussed with the patient and family. After consideration of risks, benefits and other options for treatment, the patient has consented to  Procedure(s): [place] (N/A) as a surgical intervention.  The patient's history has been reviewed, patient examined, no change in status, stable for surgery.  I have reviewed the patient's chart and labs.  Questions were answered to the patient's satisfaction.    Patient seen in pre-op. No barriers to proceed. Xarelto has been held.   [place]

## 2020-01-08 NOTE — Anesthesia Procedure Notes (Signed)
Procedure Name: LMA Insertion Date/Time: 01/08/2020 7:47 AM Performed by: Renato Shin, CRNA Pre-anesthesia Checklist: Patient identified, Emergency Drugs available, Suction available and Patient being monitored Patient Re-evaluated:Patient Re-evaluated prior to induction Oxygen Delivery Method: Circle system utilized Preoxygenation: Pre-oxygenation with 100% oxygen Induction Type: IV induction LMA: LMA with gastric port inserted LMA Size: 4.0 Number of attempts: 1 Placement Confirmation: positive ETCO2 and breath sounds checked- equal and bilateral Tube secured with: Tape Dental Injury: Teeth and Oropharynx as per pre-operative assessment

## 2020-01-08 NOTE — Anesthesia Postprocedure Evaluation (Signed)
Anesthesia Post Note  Patient: Tonesha Tsou  Procedure(s) Performed: VIDEO BRONCHOSCOPY WITH ENDOBRONCHIAL ULTRASOUND (N/A ) FINE NEEDLE ASPIRATION BIOPSY BIOPSY BRONCHIAL WASHINGS BRONCHIAL BRUSHINGS     Patient location during evaluation: PACU Anesthesia Type: General Level of consciousness: awake and alert, oriented and patient cooperative Pain management: pain level controlled Vital Signs Assessment: post-procedure vital signs reviewed and stable Respiratory status: spontaneous breathing, nonlabored ventilation and respiratory function stable Cardiovascular status: blood pressure returned to baseline and stable Postop Assessment: no apparent nausea or vomiting Anesthetic complications: no    Last Vitals:  Vitals:   01/08/20 0930 01/08/20 0943  BP:  (!) 129/49  Pulse: 70 77  Resp: (!) 25 20  Temp:  37 C  SpO2: 93% 97%    Last Pain:  Vitals:   01/08/20 0943  TempSrc: Oral  PainSc: [place]

## 2020-01-08 NOTE — Progress Notes (Signed)
Patient is discharged home with family. Discharge instructions given to patient/family. Reviewed follow up appointments with patient/family. Patient/family verbalized understanding.

## 2020-01-08 NOTE — Transfer of Care (Signed)
Immediate Anesthesia Transfer of Care Note  Patient: [name]  Procedure(s) Performed: VIDEO BRONCHOSCOPY WITH ENDOBRONCHIAL ULTRASOUND (N/A ) FINE NEEDLE ASPIRATION BIOPSY BIOPSY BRONCHIAL WASHINGS BRONCHIAL BRUSHINGS  Patient Location: PACU  Anesthesia Type:General  Level of Consciousness: awake and patient cooperative  Airway & Oxygen Therapy: Patient Spontanous Breathing and Patient connected to face mask oxygen  Post-op Assessment: Report given to RN and Post -op Vital signs reviewed and stable  Post vital signs: Reviewed and stable  Last Vitals:  Vitals Value Taken Time  BP 139/49 01/08/20 0859  Temp    Pulse 70 01/08/20 0901  Resp 42 01/08/20 0901  SpO2 96 % 01/08/20 0901  Vitals shown include unvalidated device data.  Last Pain:  Vitals:   01/08/20 0706  TempSrc: Oral  PainSc: 8          Complications: No apparent anesthesia complications

## 2020-01-08 NOTE — Anesthesia Preprocedure Evaluation (Signed)
Anesthesia Evaluation  Patient identified by MRN, date of birth, ID band Patient awake    Reviewed: Allergy & Precautions, NPO status , Patient's Chart, lab work & pertinent test results  Airway Mallampati: II  TM Distance: >3 FB Neck ROM: Full    Dental no notable dental hx.    Pulmonary COPD, former smoker,  Lung mass  Quit smoking 11/2019, 50 pack year history    Pulmonary exam normal breath sounds clear to auscultation       Cardiovascular hypertension, Pt. on medications and Pt. on home beta blockers + CAD, + Cardiac Stents and +CHF  Normal cardiovascular exam+ dysrhythmias Atrial Fibrillation + pacemaker  Rhythm:Regular Rate:Normal  HLD  Echo 01/06/20: 1. Left ventricular ejection fraction, by estimation, is 55 to 60%. The  left ventricle has normal function. The left ventricle has no regional  wall motion abnormalities. Left ventricular diastolic parameters are  consistent with Grade I diastolic  dysfunction (impaired relaxation).  2. Right ventricular systolic function is normal. The right ventricular  size is normal. There is mildly elevated pulmonary artery systolic  pressure. The estimated right ventricular systolic pressure is 54.0 mmHg.  3. Left atrial size was severely dilated.  4. Right atrial size was mildly dilated.  5. The mitral valve is abnormal. Trivial mitral valve regurgitation.  6. The aortic valve is tricuspid. Aortic valve regurgitation is mild.  Mild aortic valve sclerosis is present, with no evidence of aortic valve  stenosis. Aortic regurgitation PHT measures 350 msec.  7. The inferior vena cava is normal in size with greater than 50%  respiratory variability, suggesting right atrial pressure of 3 mmHg.    Cath 2017:  Prox RCA to Dist RCA lesion, 30% stenosed.  Prox LAD lesion, 40% stenosed.  Mid LAD to Dist LAD lesion, 45% stenosed.  Ost Cx to Prox Cx lesion, 10% stenosed. The  lesion was previously treated with a drug-eluting stent greater than two years ago.  The left ventricular systolic function is normal.   PTCA of distal right coronary artery 1997 Cypher stents to circumflex 2005 Cardiac cath in 2011 with patent stent to circumflex and moderate disease elsewhere treated medically   Pacemaker 1991 for tachybrady syndrome, last generator change 2014   PAF- xarelto   Neuro/Psych negative neurological ROS  negative psych ROS   GI/Hepatic Neg liver ROS, GERD  Medicated and Controlled,  Endo/Other  diabetes, Type 2, Oral Hypoglycemic AgentsHypothyroidism   Renal/GU negative Renal ROS  negative genitourinary   Musculoskeletal negative musculoskeletal ROS (+)   Abdominal   Peds negative pediatric ROS (+)  Hematology  (+) Blood dyscrasia, anemia , hct 34.6, plt 191   Anesthesia Other Findings   Reproductive/Obstetrics negative OB ROS                             Anesthesia Physical Anesthesia Plan  ASA: III  Anesthesia Plan: General   Post-op Pain Management:    Induction: Intravenous  PONV Risk Score and Plan: 3 and Ondansetron, Dexamethasone and Treatment may vary due to age or medical condition  Airway Management Planned: Oral ETT  Additional Equipment: None  Intra-op Plan:   Post-operative Plan: Extubation in OR  Informed Consent: I have reviewed the patients History and Physical, chart, labs and discussed the procedure including the risks, benefits and alternatives for the proposed anesthesia with the patient or authorized representative who has indicated his/her understanding and acceptance.     Dental  advisory given  Plan Discussed with: CRNA  Anesthesia Plan Comments:         Anesthesia Quick Evaluation

## 2020-01-11 LAB — CYTOLOGY - NON PAP

## 2020-01-12 LAB — CYTOLOGY - NON PAP

## 2020-01-12 LAB — SURGICAL PATHOLOGY

## 2020-01-15 ENCOUNTER — Other Ambulatory Visit: Payer: Self-pay

## 2020-01-15 ENCOUNTER — Encounter (HOSPITAL_COMMUNITY)
Admission: RE | Admit: 2020-01-15 | Discharge: 2020-01-15 | Disposition: A | Discharge status: Home / Self Care | Payer: Medicare Other | Source: Ambulatory Visit | Attending: Internal Medicine | Admitting: Internal Medicine

## 2020-01-15 ENCOUNTER — Ambulatory Visit (HOSPITAL_COMMUNITY): Admission: RE | Admit: 2020-01-15 | Payer: Medicare Other | Source: Ambulatory Visit

## 2020-01-15 DIAGNOSIS — C7951 Secondary malignant neoplasm of bone: Secondary | ICD-10-CM | POA: Diagnosis not present

## 2020-01-15 DIAGNOSIS — J439 Emphysema, unspecified: Secondary | ICD-10-CM | POA: Insufficient documentation

## 2020-01-15 DIAGNOSIS — C7802 Secondary malignant neoplasm of left lung: Secondary | ICD-10-CM | POA: Insufficient documentation

## 2020-01-15 DIAGNOSIS — C3490 Malignant neoplasm of unspecified part of unspecified bronchus or lung: Secondary | ICD-10-CM | POA: Diagnosis not present

## 2020-01-15 DIAGNOSIS — C771 Secondary and unspecified malignant neoplasm of intrathoracic lymph nodes: Secondary | ICD-10-CM | POA: Diagnosis not present

## 2020-01-15 DIAGNOSIS — I2510 Atherosclerotic heart disease of native coronary artery without angina pectoris: Secondary | ICD-10-CM | POA: Insufficient documentation

## 2020-01-15 DIAGNOSIS — I700 Atherosclerosis of aorta: Secondary | ICD-10-CM | POA: Diagnosis not present

## 2020-01-15 LAB — GLUCOSE, CAPILLARY: Glucose-Capillary: 85 mg/dL (ref 70–99)

## 2020-01-15 MED ORDER — FLUDEOXYGLUCOSE F - 18 (FDG) INJECTION
5.6000 | Freq: Once | INTRAVENOUS | Status: AC | PRN
  Administered 2020-01-15: 5.6 via INTRAVENOUS

## 2020-01-18 ENCOUNTER — Inpatient Hospital Stay: Payer: Medicare Other | Admitting: Internal Medicine

## 2020-01-18 ENCOUNTER — Ambulatory Visit: Payer: Medicare Other | Admitting: Nurse Practitioner

## 2020-01-18 ENCOUNTER — Encounter: Payer: Medicare Other | Admitting: Thoracic Surgery (Cardiothoracic Vascular Surgery)

## 2020-01-19 ENCOUNTER — Other Ambulatory Visit: Payer: Self-pay

## 2020-01-19 ENCOUNTER — Encounter (HOSPITAL_COMMUNITY): Payer: Self-pay | Admitting: Emergency Medicine

## 2020-01-19 ENCOUNTER — Emergency Department (HOSPITAL_COMMUNITY)
Admission: EM | Admit: 2020-01-19 | Discharge: 2020-01-19 | Disposition: A | Discharge status: Home / Self Care | Payer: Medicare Other | Attending: Emergency Medicine | Admitting: Emergency Medicine

## 2020-01-19 DIAGNOSIS — Z743 Need for continuous supervision: Secondary | ICD-10-CM | POA: Diagnosis not present

## 2020-01-19 DIAGNOSIS — R0789 Other chest pain: Secondary | ICD-10-CM | POA: Diagnosis not present

## 2020-01-19 DIAGNOSIS — J439 Emphysema, unspecified: Secondary | ICD-10-CM | POA: Diagnosis not present

## 2020-01-19 DIAGNOSIS — C771 Secondary and unspecified malignant neoplasm of intrathoracic lymph nodes: Secondary | ICD-10-CM | POA: Diagnosis not present

## 2020-01-19 DIAGNOSIS — R55 Syncope and collapse: Secondary | ICD-10-CM | POA: Insufficient documentation

## 2020-01-19 DIAGNOSIS — I959 Hypotension, unspecified: Secondary | ICD-10-CM | POA: Diagnosis not present

## 2020-01-19 DIAGNOSIS — I700 Atherosclerosis of aorta: Secondary | ICD-10-CM | POA: Diagnosis not present

## 2020-01-19 DIAGNOSIS — Z5321 Procedure and treatment not carried out due to patient leaving prior to being seen by health care provider: Secondary | ICD-10-CM | POA: Insufficient documentation

## 2020-01-19 DIAGNOSIS — C7802 Secondary malignant neoplasm of left lung: Secondary | ICD-10-CM | POA: Diagnosis not present

## 2020-01-19 DIAGNOSIS — C3490 Malignant neoplasm of unspecified part of unspecified bronchus or lung: Secondary | ICD-10-CM | POA: Diagnosis not present

## 2020-01-19 DIAGNOSIS — I2510 Atherosclerotic heart disease of native coronary artery without angina pectoris: Secondary | ICD-10-CM | POA: Diagnosis not present

## 2020-01-19 DIAGNOSIS — R0902 Hypoxemia: Secondary | ICD-10-CM | POA: Diagnosis not present

## 2020-01-19 DIAGNOSIS — R42 Dizziness and giddiness: Secondary | ICD-10-CM | POA: Diagnosis not present

## 2020-01-19 DIAGNOSIS — C7951 Secondary malignant neoplasm of bone: Secondary | ICD-10-CM | POA: Diagnosis not present

## 2020-01-19 LAB — BASIC METABOLIC PANEL
Anion gap: 10 (ref 5–15)
BUN: 17 mg/dL (ref 8–23)
CO2: 25 mmol/L (ref 22–32)
Calcium: 9.7 mg/dL (ref 8.9–10.3)
Chloride: 103 mmol/L (ref 98–111)
Creatinine, Ser: 0.92 mg/dL (ref 0.44–1.00)
GFR calc Af Amer: >60 mL/min (ref >60)
GFR calc non Af Amer: 58 mL/min — ABNORMAL LOW (ref >60)
Glucose, Bld: 123 mg/dL — ABNORMAL HIGH (ref 70–99)
Potassium: 4.7 mmol/L (ref 3.5–5.1)
Sodium: 138 mmol/L (ref 135–145)

## 2020-01-19 LAB — CBC
HCT: 37.2 % (ref 36.0–46.0)
Hemoglobin: 11.5 g/dL — ABNORMAL LOW (ref 12.0–15.0)
MCH: 28.1 pg (ref 26.0–34.0)
MCHC: 30.9 g/dL (ref 30.0–36.0)
MCV: 91 fL (ref 80.0–100.0)
Platelets: 225 10*3/uL (ref 150–400)
RBC: 4.09 MIL/uL (ref 3.87–5.11)
RDW: 14.1 % (ref 11.5–15.5)
WBC: 6.2 10*3/uL (ref 4.0–10.5)
nRBC: 0 % (ref 0.0–0.2)

## 2020-01-19 NOTE — ED Triage Notes (Signed)
Pt arrives via EMS from shopping with near syncopal episode and CP that resolved before EMS arrived. Had PET scan Friday for cancer check up but has not received results yet.

## 2020-01-20 ENCOUNTER — Telehealth: Payer: Self-pay | Admitting: Internal Medicine

## 2020-01-20 ENCOUNTER — Encounter: Payer: Medicare Other | Admitting: Thoracic Surgery (Cardiothoracic Vascular Surgery)

## 2020-01-20 NOTE — Telephone Encounter (Signed)
Rescheduled 4/19 to 4/29 per patient request to reschedule. Called and left msg.

## 2020-01-21 ENCOUNTER — Other Ambulatory Visit: Payer: Self-pay | Admitting: *Deleted

## 2020-01-21 ENCOUNTER — Encounter: Payer: Self-pay | Admitting: Radiation Oncology

## 2020-01-21 NOTE — Progress Notes (Signed)
The patient's case was discussed today in multidisciplinary thoracic conference.  The patient appears to have extensive stage small cell lung cancer with a left iliac metastasis seen on PET scan.  The patient is scheduled to see medical oncology soon and she is a candidate for palliative radiation treatment to the pelvis in the event of local symptoms in this region.  Additionally, at the appropriate time, the patient is a potential candidate for prophylactic cranial irradiation/consolidative thoracic radiation treatment.  The plan initially will be to proceed with systemic treatment.  We will be happy to see her in the near future or at a later date depending on her further discussions with medical oncology.  ------------------------------------------------  Jodelle Gross, MD, PhD

## 2020-01-21 NOTE — Progress Notes (Signed)
The proposed treatment discussed in cancer conference is for discussion purpose only and is not a binding recommendation. The patient was not physically examined nor present for their treatment options. Therefore, final treatment plans cannot be decided.  ?

## 2020-01-28 ENCOUNTER — Encounter: Payer: Self-pay | Admitting: Internal Medicine

## 2020-01-28 ENCOUNTER — Other Ambulatory Visit: Payer: Self-pay

## 2020-01-28 ENCOUNTER — Inpatient Hospital Stay (HOSPITAL_BASED_OUTPATIENT_CLINIC_OR_DEPARTMENT_OTHER): Payer: Medicare Other | Admitting: Internal Medicine

## 2020-01-28 VITALS — BP 97/59 | HR 77 | Temp 97.8°F | Resp 18 | Ht 62.0 in | Wt 108.6 lb

## 2020-01-28 DIAGNOSIS — Z7189 Other specified counseling: Secondary | ICD-10-CM

## 2020-01-28 DIAGNOSIS — C3491 Malignant neoplasm of unspecified part of right bronchus or lung: Secondary | ICD-10-CM | POA: Diagnosis not present

## 2020-01-28 DIAGNOSIS — E038 Other specified hypothyroidism: Secondary | ICD-10-CM

## 2020-01-28 DIAGNOSIS — Z5111 Encounter for antineoplastic chemotherapy: Secondary | ICD-10-CM | POA: Diagnosis not present

## 2020-01-28 DIAGNOSIS — C3411 Malignant neoplasm of upper lobe, right bronchus or lung: Secondary | ICD-10-CM | POA: Diagnosis not present

## 2020-01-28 DIAGNOSIS — Z5112 Encounter for antineoplastic immunotherapy: Secondary | ICD-10-CM | POA: Diagnosis not present

## 2020-01-28 DIAGNOSIS — C7951 Secondary malignant neoplasm of bone: Secondary | ICD-10-CM | POA: Diagnosis not present

## 2020-01-28 NOTE — Progress Notes (Signed)
[place] Telephone:([phone]) [phone]   Fax:(336) [phone]  OFFICE PROGRESS NOTE  [name], Robin G, MD [address] [place] [address]  DIAGNOSIS: Extensive stage (T2 a, N2, M1 C) small cell lung cancer presented with central right upper lobe lung mass in addition to left lower lobe pulmonary metastasis and bilateral hilar, subcarinal and bilateral paratracheal and left prevascular lymphadenopathy in addition to bone metastasis in the left iliac wing diagnosed in April 2021.  PRIOR THERAPY: None  CURRENT THERAPY: Systemic chemotherapy with carboplatin for AUC of 5 on day 1, etoposide 100 mg/M2 on days 1, 2 and 3 in addition to Imfinzi 1500 mg IV every 3 weeks during chemotherapy followed by maintenance Imfinzi 1500 mg IV every 4 weeks after cycle #4.  We may also consider the patient for treatment with Cosela for Myeloprotection.  INTERVAL HISTORY: [name] 85 y.o. female returns to the clinic today for follow-up visit accompanied by her daughter.  The patient was recently diagnosed with small cell lung cancer based on bronchoscopy and endobronchial biopsy by Dr. Valeta Harms.  She also had imaging studies including CT of the head with and without contrast that was unremarkable for metastatic disease to the brain.  She has a PET scan that showed the hypermetabolic activity in the central right upper lobe lung mass in addition to left lower lobe lung nodule as well as subcarinal and bilateral hilar and paratracheal and left prevascular lymphadenopathy.  She also had metastatic lesion to the left iliac wing.  The patient is here today for evaluation and discussion of her treatment options.  She denied having any recent weight loss or night sweats.  She has intermittent nausea and diarrhea.  She also has substernal chest discomfort but no significant shortness of breath, cough or hemoptysis.  She has no fever or chills.  She has no headache or visual changes.  MEDICAL  HISTORY: Past Medical History:  Diagnosis Date   CAD (coronary artery disease), native coronary artery    PTCA of distal right coronary artery 1997 Cypher stents to circumflex 2005 Cardiac cath in 2011 with patent stent to circumflex and moderate disease elsewhere treated medically    Cancer ([place])    Cardiac pacemaker in situ 12/24/2015   Original implant reportedly in 1991 for tachybradycardia syndrome, generator change in 1999, 2004 and evidently again in 2016 in [place]    COPD (chronic obstructive pulmonary disease) ([place])    Diabetes mellitus without complication ([place])    Hypertension    Hypothyroidism 03/23/2010   Pacemaker    Paroxysmal atrial fibrillation ([place]) 03/23/2010   CHA2DS2VASC score 5     ALLERGIES:  has No Known Allergies.  MEDICATIONS:  Current Outpatient Medications  Medication Sig Dispense Refill   acetaminophen (TYLENOL) 500 MG tablet Take 500 mg by mouth every 6 (six) hours as needed for headache.     albuterol (VENTOLIN HFA) 108 (90 Base) MCG/ACT inhaler TAKE 2 PUFFS BY MOUTH EVERY 6 HOURS AS NEEDED FOR WHEEZE OR SHORTNESS OF BREATH*NOT COVERED* (Patient taking differently: Inhale 2 puffs into the lungs every 6 (six) hours as needed for wheezing or shortness of breath. ) 8.5 [name] 1   atorvastatin (LIPITOR) 10 MG tablet Take 1 tablet (10 mg total) by mouth daily. (Patient taking differently: Take 10 mg by mouth daily with supper. ) 90 tablet 3   carvedilol (COREG) 12.5 MG tablet TAKE 1 TABLET (12.5 MG TOTAL) BY MOUTH 2 (TWO) TIMES DAILY WITH  A MEAL. 180 tablet 2   [employer] NICOTINE TRANSDERMAL SYS 14 MG/24HR patch PLACE 1 PATCH (14 MG TOTAL) ONTO THE SKIN DAILY. 28 patch 0   diclofenac sodium (VOLTAREN) 1 % GEL Apply 4 [name] topically 4 (four) times daily. (Patient taking differently: Apply 4 [name] topically 4 (four) times daily as needed (pain). ) 4 Tube 3   dicyclomine (BENTYL) 10 MG capsule TAKE 1 CAPSULE (10 MG TOTAL) BY MOUTH 4 (FOUR) TIMES DAILY - BEFORE MEALS  AND AT BEDTIME. 90 capsule 0   dronedarone (MULTAQ) 400 MG tablet Take 1 tablet (400 mg total) by mouth daily. (Patient taking differently: Take 400 mg by mouth daily after breakfast. ) 90 tablet 3   fenofibrate (TRICOR) 48 MG tablet TAKE 1 TABLET BY MOUTH EVERY DAY (Patient taking differently: Take 48 mg by mouth daily after lunch. ) 90 tablet 3   glucose blood (ONETOUCH VERIO) test strip Use to test 3-4 times daily. 300 strip 4   levothyroxine (SYNTHROID) 112 MCG tablet TAKE 1 TABLET (112 MCG TOTAL) BY MOUTH DAILY BEFORE BREAKFAST. 90 tablet 2   linagliptin (TRADJENTA) 5 MG TABS tablet TAKE 1 TABLET (5 MG TOTAL) DAILY BY MOUTH. (Patient taking differently: Take 5 mg by mouth daily after breakfast. ) 90 tablet 1   losartan (COZAAR) 50 MG tablet Take 2 tablets by mouth daily. (Patient taking differently: Take 100 mg by mouth daily. ) 180 tablet 1   nitroGLYCERIN (NITROSTAT) 0.4 MG SL tablet Place 1 tablet (0.4 mg total) under the tongue every 5 (five) minutes x 3 doses as needed for chest pain (Max 3 doses within 15 min. Call 911). (Patient not taking: Reported on 01/01/2020) 10 tablet 0   pantoprazole (PROTONIX) 40 MG tablet Take 1 tablet (40 mg total) by mouth daily. (Patient taking differently: Take 40 mg by mouth at bedtime. ) 30 tablet 3   potassium chloride SA (KLOR-CON) 20 MEQ tablet 1 tab bid x 2 then 1 tab daily (Patient taking differently: Take 20 mEq by mouth at bedtime. ) 30 tablet 3   XARELTO 15 MG TABS tablet TAKE 1 TABLET BY MOUTH DAILY WITH SUPPER. (Patient taking differently: Take 15 mg by mouth daily with supper. ) 30 tablet 3   No current facility-administered medications for this visit.    SURGICAL HISTORY:  Past Surgical History:  Procedure Laterality Date   BIOPSY  01/08/2020   Procedure: BIOPSY;  Surgeon: Garner Nash, DO;  Location: [facility];  Service: Pulmonary;;   BRONCHIAL BRUSHINGS  01/08/2020   Procedure: BRONCHIAL BRUSHINGS;  Surgeon: Garner Nash,  DO;  Location: [facility];  Service: Pulmonary;;   BRONCHIAL WASHINGS  01/08/2020   Procedure: BRONCHIAL WASHINGS;  Surgeon: Garner Nash, DO;  Location: [facility];  Service: Pulmonary;;   CARDIAC CATHETERIZATION N/A 12/26/2015   Procedure: Left Heart Cath and Coronary Angiography;  Surgeon: Peter M [name], MD;  Location: [facility];  Service: Cardiovascular;  Laterality: N/A;   CARDIOVERSION  05/2015   CHOLECYSTECTOMY  2012   CORONARY ANGIOPLASTY WITH STENT PLACEMENT  1990   ENDOBRONCHIAL ULTRASOUND  01/08/2020   Procedure: ENDOBRONCHIAL ULTRASOUND;  Surgeon: Garner Nash, DO;  Location: [facility];  Service: Pulmonary;;   FINE NEEDLE ASPIRATION BIOPSY  01/08/2020   Procedure: FINE NEEDLE ASPIRATION BIOPSY;  Surgeon: Garner Nash, DO;  Location: [facility];  Service: Pulmonary;;   HERNIA REPAIR     PACEMAKER GENERATOR CHANGE  [id number], 2016   PACEMAKER INSERTION  1991   VIDEO BRONCHOSCOPY WITH ENDOBRONCHIAL ULTRASOUND N/A 01/08/2020   Procedure: VIDEO BRONCHOSCOPY;  Surgeon: Garner Nash, DO;  Location: [facility];  Service: Pulmonary;  Laterality: N/A;    REVIEW OF SYSTEMS:  Constitutional: positive for fatigue Eyes: negative Ears, nose, mouth, throat, and face: negative Respiratory: positive for pleurisy/chest pain Cardiovascular: negative Gastrointestinal: negative Genitourinary:negative Integument/breast: negative Hematologic/lymphatic: negative Musculoskeletal:positive for bone pain Neurological: negative Behavioral/Psych: negative Endocrine: negative Allergic/Immunologic: negative   PHYSICAL EXAMINATION: General appearance: alert, cooperative, appears stated age, fatigued and no distress Head: Normocephalic, without obvious abnormality, atraumatic Neck: no adenopathy, no JVD, supple, symmetrical, trachea midline and thyroid not enlarged, symmetric, no tenderness/mass/nodules Lymph nodes: Cervical, supraclavicular, and axillary nodes  normal. Resp: clear to auscultation bilaterally Back: symmetric, no curvature. ROM normal. No CVA tenderness. Cardio: regular rate and rhythm, S1, S2 normal, no murmur, click, rub or gallop GI: soft, non-tender; bowel sounds normal; no masses,  no organomegaly Extremities: extremities normal, atraumatic, no cyanosis or edema Neurologic: Alert and oriented X 3, normal strength and tone. Normal symmetric reflexes. Normal coordination and gait  ECOG PERFORMANCE STATUS: 1 - Symptomatic but completely ambulatory  Blood pressure (!) 97/59, pulse 77, temperature 97.8 F (36.6 C), temperature source Temporal, resp. rate 18, height 5\' 2"  (1.575 m), weight 108 lb 9.6 oz (49.3 kg), SpO2 100 %.  LABORATORY DATA: Lab Results  Component Value Date   WBC 6.2 01/19/2020   HGB 11.5 (L) 01/19/2020   HCT 37.2 01/19/2020   MCV 91.0 01/19/2020   PLT 225 01/19/2020      Chemistry      Component Value Date/Time   NA 138 01/19/2020 1814   K 4.7 01/19/2020 1814   CL 103 01/19/2020 1814   CO2 25 01/19/2020 1814   BUN 17 01/19/2020 1814   CREATININE 0.92 01/19/2020 1814   CREATININE 0.75 01/01/2020 0910      Component Value Date/Time   CALCIUM 9.7 01/19/2020 1814   ALKPHOS 61 01/01/2020 0910   AST 13 (L) 01/01/2020 0910   ALT 10 01/01/2020 0910   BILITOT 0.3 01/01/2020 0910       RADIOGRAPHIC STUDIES: CT HEAD W & WO CONTRAST  Result Date: 01/05/2020 CLINICAL DATA:  Presyncope, lung masses EXAM: CT HEAD WITHOUT AND WITH CONTRAST TECHNIQUE: Contiguous axial images were obtained from the base of the skull through the vertex without and with intravenous contrast CONTRAST:  128mL OMNIPAQUE IOHEXOL 300 MG/ML  SOLN COMPARISON:  None. FINDINGS: Brain: There is no acute intracranial hemorrhage, mass, mass effect, or edema. No abnormal enhancement. Gray-white differentiation is preserved. There is no extra-axial fluid collection. Patchy hypoattenuation in the supratentorial white matter is nonspecific but  may reflect mild chronic microvascular ischemic changes. Ventricles and sulci are within normal limits in size and configuration. Vascular: There is atherosclerotic calcification at the skull base. Skull: Calvarium is unremarkable. Sinuses/Orbits: Mild paranasal sinus mucosal thickening. Hypoplastic left maxillary sinus. Orbits are unremarkable. Other: None. IMPRESSION: No acute intracranial abnormality. No evidence intracranial metastatic disease. Mild chronic microvascular ischemic changes Electronically Signed   By: Macy Mis M.D.   On: 01/05/2020 15:52   NM PET Image Initial (PI) Skull Base To Thigh  Result Date: 01/15/2020 CLINICAL DATA:  Initial treatment strategy for recently diagnosed small cell lung carcinoma on right mainstem endobronchial biopsy 01/08/2020. EXAM: NUCLEAR MEDICINE PET SKULL BASE TO THIGH TECHNIQUE: 5.6 mCi F-18 FDG was injected intravenously. Full-ring PET imaging was performed from the skull base to thigh after the radiotracer. CT  data was obtained and used for attenuation correction and anatomic localization. Fasting blood glucose: 85 mg/dl COMPARISON:  12/27/2019 CT angiogram of the chest, abdomen and pelvis. FINDINGS: Mediastinal blood pool activity: SUV max 2.6 Liver activity: SUV max NA NECK: No hypermetabolic lymph nodes in the neck. Incidental CT findings: none CHEST: Hypermetabolic irregular solid central right upper lobe 3.2 cm lung mass (series 8/image 22) with max SUV 20.8, contiguous with the right hilum. Hypermetabolic 2.7 cm solid peripheral left lower lobe pulmonary nodule (series 8/image 34) with max SUV 11.0. Hypermetabolic subcarinal, bilateral paratracheal and left prevascular lymphadenopathy. Representative 2.0 cm left prevascular node with max SUV 10.1 (series 4/image 55). Hypermetabolic 3.0 cm subcarinal node with max SUV 19.0 (series 4/image 63). Representative high left paratracheal 0.8 cm node with max SUV 9.9 (series 4/image 45). Representative right  paratracheal 2.1 cm node with max SUV 18.7 (series 4/image 60). Hypermetabolic left hilar node with max SUV 11.7. Incidental CT findings: Three-vessel coronary atherosclerosis. Two lead right subclavian ICD with lead tips in the right ventricle. Mild cardiomegaly. Atherosclerotic nonaneurysmal thoracic aorta. Marked centrilobular and paraseptal emphysema. ABDOMEN/PELVIS: No abnormal hypermetabolic activity within the liver, pancreas, adrenal glands, or spleen. No hypermetabolic lymph nodes in the abdomen or pelvis. Incidental CT findings: Cholecystectomy. Atherosclerotic nonaneurysmal abdominal aorta. SKELETON: Hypermetabolic lytic 1.4 cm left iliac wing lesion with max SUV 8.9 (series 4/image 138). No additional hypermetabolic osseous lesions. Incidental CT findings: Fixation hardware in the proximal right femur. IMPRESSION: 1. Hypermetabolic 3.2 cm irregular central right upper lobe lung mass compatible with primary bronchogenic carcinoma. 2. Hypermetabolic 2.7 cm left lower lobe contralateral pulmonary metastasis. 3. Hypermetabolic nodal metastatic disease in the bilateral hilar, subcarinal, bilateral paratracheal and left prevascular chains. 4. Solitary hypermetabolic bone metastasis to the left iliac wing. 5. Chronic findings include: Aortic Atherosclerosis (ICD10-I70.0) and Emphysema (ICD10-J43.9). Coronary atherosclerosis. Electronically Signed   By: Ilona Sorrel M.D.   On: 01/15/2020 15:12   DG Chest Portable 1 View  Result Date: 01/05/2020 CLINICAL DATA:  Near syncope. EXAM: PORTABLE CHEST 1 VIEW COMPARISON:  11/07/2018 FINDINGS: There is a multi lead right-sided pacemaker in place. The heart size is stable from prior study. There are aortic calcifications. The previously demonstrated bilateral lung masses are better visualized on prior CT of the chest. Emphysematous changes are noted. There is no pneumothorax. Small bilateral pleural effusions are noted. IMPRESSION: 1. Cardiomegaly with small bilateral  pleural effusions. 2. Previously demonstrated lung masses are better visualized on prior CT. 3. Emphysema. Electronically Signed   By: Constance Holster M.D.   On: 01/05/2020 03:16   ECHOCARDIOGRAM COMPLETE  Result Date: 01/06/2020    ECHOCARDIOGRAM REPORT   Patient Name:   [name] Date of Exam: 01/06/2020 Medical Rec #:  [id number]   Height:       62.0 in Accession #:    [id number]  Weight:       112.4 lb Date of Birth:  1935-07-14   BSA:          1.496 m Patient Age:    26 years    BP:           138/53 mmHg Patient Gender: F           HR:           70 bpm. Exam Location:  Inpatient Procedure: 2D Echo, Color Doppler and Cardiac Doppler Indications:    R55 Syncope  History:        Patient has prior history of Echocardiogram  examinations, most                 recent 03/10/2017. CAD, Pacemaker, COPD, Arrythmias:Atrial                 Fibrillation; Risk Factors:Hypertension, Diabetes and                 Dyslipidemia.  Sonographer:    Raquel Sarna Senior RDCS Referring Phys: [place]  1. Left ventricular ejection fraction, by estimation, is 55 to 60%. The left ventricle has normal function. The left ventricle has no regional wall motion abnormalities. Left ventricular diastolic parameters are consistent with Grade I diastolic dysfunction (impaired relaxation).  2. Right ventricular systolic function is normal. The right ventricular size is normal. There is mildly elevated pulmonary artery systolic pressure. The estimated right ventricular systolic pressure is 31.5 mmHg.  3. Left atrial size was severely dilated.  4. Right atrial size was mildly dilated.  5. The mitral valve is abnormal. Trivial mitral valve regurgitation.  6. The aortic valve is tricuspid. Aortic valve regurgitation is mild. Mild aortic valve sclerosis is present, with no evidence of aortic valve stenosis. Aortic regurgitation PHT measures 350 msec.  7. The inferior vena cava is normal in size with greater than 50% respiratory  variability, suggesting right atrial pressure of 3 mmHg. FINDINGS  Left Ventricle: Left ventricular ejection fraction, by estimation, is 55 to 60%. The left ventricle has normal function. The left ventricle has no regional wall motion abnormalities. The left ventricular internal cavity size was normal in size. There is  no left ventricular hypertrophy. Left ventricular diastolic parameters are consistent with Grade I diastolic dysfunction (impaired relaxation). Indeterminate filling pressures. Right Ventricle: The right ventricular size is normal. No increase in right ventricular wall thickness. Right ventricular systolic function is normal. There is mildly elevated pulmonary artery systolic pressure. The tricuspid regurgitant velocity is 2.61  m/s, and with an assumed right atrial pressure of 3 mmHg, the estimated right ventricular systolic pressure is 40.0 mmHg. Left Atrium: Left atrial size was severely dilated. Right Atrium: Right atrial size was mildly dilated. Pericardium: There is no evidence of pericardial effusion. Mitral Valve: The mitral valve is abnormal. Mild mitral annular calcification. Trivial mitral valve regurgitation. Tricuspid Valve: The tricuspid valve is grossly normal. Tricuspid valve regurgitation is mild. Aortic Valve: The aortic valve is tricuspid. Aortic valve regurgitation is mild. Aortic regurgitation PHT measures 350 msec. Mild aortic valve sclerosis is present, with no evidence of aortic valve stenosis. Pulmonic Valve: The pulmonic valve was grossly normal. Pulmonic valve regurgitation is trivial. Aorta: The aortic root and ascending aorta are structurally normal, with no evidence of dilitation. Venous: The inferior vena cava is normal in size with greater than 50% respiratory variability, suggesting right atrial pressure of 3 mmHg. IAS/Shunts: No atrial level shunt detected by color flow Doppler. Additional Comments: A pacer wire is visualized.  LEFT VENTRICLE PLAX 2D LVIDd:          5.00 cm  Diastology LVIDs:         3.70 cm  LV e' lateral:   9.68 cm/s LV PW:         1.00 cm  LV E/e' lateral: 6.4 LV IVS:        1.00 cm  LV e' medial:    6.09 cm/s LVOT diam:     1.90 cm  LV E/e' medial:  10.2 LV SV:         52 LV SV Index:  35 LVOT Area:     2.84 cm  RIGHT VENTRICLE RV S prime:     8.81 cm/s TAPSE (M-mode): 2.0 cm LEFT ATRIUM             Index       RIGHT ATRIUM           Index LA diam:        3.90 cm 2.61 cm/m  RA Area:     14.30 cm LA Vol (A2C):   74.7 ml 49.92 ml/m RA Volume:   30.70 ml  20.52 ml/m LA Vol (A4C):   90.2 ml 60.28 ml/m LA Biplane Vol: 82.9 ml 55.40 ml/m  AORTIC VALVE LVOT Vmax:   84.20 cm/s LVOT Vmean:  55.200 cm/s LVOT VTI:    0.183 m AI PHT:      350 msec  AORTA Ao Root diam: 2.80 cm Ao Asc diam:  3.00 cm MITRAL VALVE               TRICUSPID VALVE MV Area (PHT): 2.91 cm    TR Peak grad:   27.2 mmHg MV Decel Time: 261 msec    TR Vmax:        261.00 cm/s MV E velocity: 62.10 cm/s MV A velocity: 49.30 cm/s  SHUNTS MV E/A ratio:  1.26        Systemic VTI:  0.18 m                            Systemic Diam: 1.90 cm Lyman Bishop MD Electronically signed by Lyman Bishop MD Signature Date/Time: 01/06/2020/11:27:11 AM    Final     ASSESSMENT AND PLAN: This is a very pleasant 85 years old African-American female recently diagnosed with extensive stage small cell lung cancer presented with central right upper lobe lung mass in addition to left lower lobe lung nodule and mediastinal lymphadenopathy and metastatic bone lesion in the left iliac wing diagnosed in April 2021. I personally and independently reviewed the imaging studies and discussed the results with the patient and her daughter. I discussed with the patient and her daughter her treatment options including palliative care and hospice referral versus consideration of palliative systemic chemotherapy with carboplatin for AUC of 5 on day 1, etoposide 100 mg/M2 on days 1, 2 and 3 with Imfinzi 1500 mg IV every 3 weeks.   We may also consider the patient for Myeloprotection with Cosela. The patient is interested in the treatment and she is expected to start the first cycle of this treatment on Feb 08, 2020. I discussed with the patient and her daughter the adverse effect of this treatment including but not limited to alopecia, myelosuppression, nausea and vomiting, peripheral neuropathy, liver or renal dysfunction as well as immunotherapy adverse effects. She will have a chemotherapy education class before the first dose of her treatment. I will call her pharmacy with prescription for Compazine 10 mg p.o. every 6 hours as needed for nausea. The patient will come back for follow-up visit in 1 week after the first dose of her treatment for management of any adverse effects. She was advised to call immediately if she has any other concerning symptoms in the interval. The patient voices understanding of current disease status and treatment options and is in agreement with the current care plan.  All questions were answered. The patient knows to call the clinic with any problems, questions or concerns. We can certainly see the patient  much sooner if necessary.  The total time spent in the appointment was 50 minutes.  Disclaimer: This note was dictated with voice recognition software. Similar sounding words can inadvertently be transcribed and may not be corrected upon review.

## 2020-01-28 NOTE — Progress Notes (Signed)
START ON PATHWAY REGIMEN - Small Cell Lung     Cycles 1 through 4: A cycle is every 21 days:     Durvalumab      Carboplatin      Etoposide    Cycles 5 and beyond: A cycle is every 28 days:     Durvalumab   **Always confirm dose/schedule in your pharmacy ordering system**  Patient Characteristics: Newly Diagnosed, Preoperative or Nonsurgical Candidate (Clinical Staging), First Line, Extensive Stage Therapeutic Status: Newly Diagnosed, Preoperative or Nonsurgical Candidate (Clinical Staging) AJCC T Category: cT2a AJCC N Category: cN2 AJCC M Category: pM1c AJCC 8 Stage Grouping: IVB Stage Classification: Extensive Intent of Therapy: Non-Curative / Palliative Intent, Discussed with Patient

## 2020-01-29 ENCOUNTER — Telehealth: Payer: Self-pay

## 2020-01-29 NOTE — Telephone Encounter (Signed)
[place] alert received for AF episode in progress.  V rates controlled.  It appears episode began around 10pm 4/29.  Pt has known hsitory of PAF, current meds include Multaq 400mg  BID, Carvedilol 12.5mg  BID and Xarelto 15mg  daily.    Spoke with pt daughter, she reports pt reviewed bad news at the oncologist yesterday and has been under increased stress/ anxiety related to lung cancer treatment.  Daughter spoke with pt while on phone regarding symptoms.  Pt denies any symptoms at start of episode or currently. Daughter also confirmed pt compliance with medications.    Advised will forward to MD for review, anticipate continued montioring.  Daughter v/u of s/s of worsening condition to monitor and report.

## 2020-01-29 NOTE — Telephone Encounter (Signed)
Hold multaq, follow up in AF clinic.

## 2020-01-29 NOTE — Telephone Encounter (Signed)
Called and spoke with daughter, [name], she is aware of appt 02/01/20 @10  am with Roderic Palau, NP

## 2020-01-29 NOTE — Telephone Encounter (Signed)
Spoke with pt, advised of Dr. Curt Bears recommendations to stop Multaq and f/u with AF clinic.  Pt v/u of instructions and will await call from AF clinic to set up consult.

## 2020-02-01 ENCOUNTER — Other Ambulatory Visit: Payer: Self-pay

## 2020-02-01 ENCOUNTER — Other Ambulatory Visit: Payer: Self-pay | Admitting: Cardiology

## 2020-02-01 ENCOUNTER — Encounter (HOSPITAL_COMMUNITY): Payer: Self-pay | Admitting: Nurse Practitioner

## 2020-02-01 ENCOUNTER — Ambulatory Visit (HOSPITAL_COMMUNITY)
Admission: RE | Admit: 2020-02-01 | Discharge: 2020-02-01 | Disposition: A | Discharge status: Home / Self Care | Payer: Medicare Other | Source: Ambulatory Visit | Attending: Nurse Practitioner | Admitting: Nurse Practitioner

## 2020-02-01 VITALS — BP 102/44 | HR 72 | Ht 62.0 in | Wt 111.6 lb

## 2020-02-01 DIAGNOSIS — C7951 Secondary malignant neoplasm of bone: Secondary | ICD-10-CM | POA: Insufficient documentation

## 2020-02-01 DIAGNOSIS — Z7901 Long term (current) use of anticoagulants: Secondary | ICD-10-CM | POA: Diagnosis not present

## 2020-02-01 DIAGNOSIS — Z79899 Other long term (current) drug therapy: Secondary | ICD-10-CM | POA: Diagnosis not present

## 2020-02-01 DIAGNOSIS — E039 Hypothyroidism, unspecified: Secondary | ICD-10-CM | POA: Insufficient documentation

## 2020-02-01 DIAGNOSIS — Z955 Presence of coronary angioplasty implant and graft: Secondary | ICD-10-CM | POA: Insufficient documentation

## 2020-02-01 DIAGNOSIS — Z87891 Personal history of nicotine dependence: Secondary | ICD-10-CM | POA: Insufficient documentation

## 2020-02-01 DIAGNOSIS — J449 Chronic obstructive pulmonary disease, unspecified: Secondary | ICD-10-CM | POA: Insufficient documentation

## 2020-02-01 DIAGNOSIS — I10 Essential (primary) hypertension: Secondary | ICD-10-CM | POA: Insufficient documentation

## 2020-02-01 DIAGNOSIS — I4891 Unspecified atrial fibrillation: Secondary | ICD-10-CM | POA: Insufficient documentation

## 2020-02-01 DIAGNOSIS — I2510 Atherosclerotic heart disease of native coronary artery without angina pectoris: Secondary | ICD-10-CM | POA: Insufficient documentation

## 2020-02-01 DIAGNOSIS — Z950 Presence of cardiac pacemaker: Secondary | ICD-10-CM | POA: Diagnosis not present

## 2020-02-01 DIAGNOSIS — E119 Type 2 diabetes mellitus without complications: Secondary | ICD-10-CM | POA: Insufficient documentation

## 2020-02-01 DIAGNOSIS — Z8249 Family history of ischemic heart disease and other diseases of the circulatory system: Secondary | ICD-10-CM | POA: Diagnosis not present

## 2020-02-01 DIAGNOSIS — C7802 Secondary malignant neoplasm of left lung: Secondary | ICD-10-CM | POA: Diagnosis not present

## 2020-02-01 DIAGNOSIS — Z7984 Long term (current) use of oral hypoglycemic drugs: Secondary | ICD-10-CM | POA: Insufficient documentation

## 2020-02-01 DIAGNOSIS — Z8379 Family history of other diseases of the digestive system: Secondary | ICD-10-CM | POA: Insufficient documentation

## 2020-02-01 DIAGNOSIS — C3411 Malignant neoplasm of upper lobe, right bronchus or lung: Secondary | ICD-10-CM | POA: Diagnosis not present

## 2020-02-01 DIAGNOSIS — R591 Generalized enlarged lymph nodes: Secondary | ICD-10-CM | POA: Insufficient documentation

## 2020-02-01 DIAGNOSIS — D6869 Other thrombophilia: Secondary | ICD-10-CM | POA: Diagnosis not present

## 2020-02-01 DIAGNOSIS — I480 Paroxysmal atrial fibrillation: Secondary | ICD-10-CM | POA: Diagnosis not present

## 2020-02-01 DIAGNOSIS — Z79890 Hormone replacement therapy (postmenopausal): Secondary | ICD-10-CM | POA: Insufficient documentation

## 2020-02-01 NOTE — Progress Notes (Signed)
Primary Care Physician: [name], [name], MD Referring Physician: Dr. Curt Bears EP: Dr. Curt Bears Oncology: Dr. Langston Masker Robin is a 85 y.o. [name] with a h/o  CAD, tachybrady syndrome s/p PPM, and afib on Multaq. and xarelto. She was recently found to have persistent afib 4/29. She  heard that  she  had new dx of  extensive stage (T2 a, N2, M1 C) small cell lung cancer  with central right upper lobe lung mass in addition to left lower lobe pulmonary metastasis and bilateral hilar, subcarinal and bilateral paratracheal and left prevascular lymphadenopathy in addition to bone metastasis in the left iliac wing, the day before afib started. Dr.Camnitz stopped Multaq and referred to the afib clinic.  She had smoked x 66 years but stopped smoking on 01/28/20. She was offered palliative care  and hospice referral vrs consideration of palliative systemic chemotherapy. Pt wishes the palliative systemic chemotherapy. She is to start the first cycle May 10. She is rate controlled today and does not feel the afib. Has felt tired for many weeks. She reports reduced appetite and weight loss for months now. She is rate controlled.  Today, she denies symptoms of palpitations, chest pain, shortness of breath, orthopnea, PND, lower extremity edema, dizziness, presyncope, syncope, or neurologic sequela. The patient is tolerating medications without difficulties and is otherwise without complaint today.   Past Medical History:  Diagnosis Date  . CAD (coronary artery disease), native coronary artery    PTCA of distal right coronary artery 1997 Cypher stents to circumflex 2005 Cardiac cath in 2011 with patent stent to circumflex and moderate disease elsewhere treated medically   . Cancer ([place])   . Cardiac pacemaker in situ 12/24/2015   Original implant reportedly in 1991 for tachybradycardia syndrome, generator change in 1999, 2004 and evidently again in 2016 in [place]   . COPD (chronic obstructive pulmonary  disease) ([place])   . Diabetes mellitus without complication ([place])   . Hypertension   . Hypothyroidism 03/23/2010  . Pacemaker   . Paroxysmal atrial fibrillation ([place]) 03/23/2010   CHA2DS2VASC score 5    Past Surgical History:  Procedure Laterality Date  . BIOPSY  01/08/2020   Procedure: BIOPSY;  Surgeon: Garner Nash, DO;  Location: [facility];  Service: Pulmonary;;  . BRONCHIAL BRUSHINGS  01/08/2020   Procedure: BRONCHIAL BRUSHINGS;  Surgeon: Garner Nash, DO;  Location: [facility];  Service: Pulmonary;;  . BRONCHIAL WASHINGS  01/08/2020   Procedure: BRONCHIAL WASHINGS;  Surgeon: Garner Nash, DO;  Location: [facility];  Service: Pulmonary;;  . CARDIAC CATHETERIZATION N/A 12/26/2015   Procedure: Left Heart Cath and Coronary Angiography;  Surgeon: Peter M [name], MD;  Location: [facility];  Service: Cardiovascular;  Laterality: N/A;  . CARDIOVERSION  05/2015  . CHOLECYSTECTOMY  2012  . CORONARY ANGIOPLASTY WITH STENT PLACEMENT  1990  . ENDOBRONCHIAL ULTRASOUND  01/08/2020   Procedure: ENDOBRONCHIAL ULTRASOUND;  Surgeon: Garner Nash, DO;  Location: [facility];  Service: Pulmonary;;  . FINE NEEDLE ASPIRATION BIOPSY  01/08/2020   Procedure: FINE NEEDLE ASPIRATION BIOPSY;  Surgeon: Garner Nash, DO;  Location: [facility];  Service: Pulmonary;;  . HERNIA REPAIR    . PACEMAKER GENERATOR CHANGE  [id number], 2016  . PACEMAKER INSERTION  1991  . VIDEO BRONCHOSCOPY WITH ENDOBRONCHIAL ULTRASOUND N/A 01/08/2020   Procedure: VIDEO BRONCHOSCOPY;  Surgeon: Garner Nash, DO;  Location: [facility];  Service: Pulmonary;  Laterality: N/A;    Current Outpatient Medications  Medication Sig Dispense Refill  . acetaminophen (TYLENOL) 500 MG tablet Take 500 mg by mouth every 6 (six) hours as needed for headache.    . albuterol (VENTOLIN HFA) 108 (90 Base) MCG/ACT inhaler TAKE 2 PUFFS BY MOUTH EVERY 6 HOURS AS NEEDED FOR WHEEZE OR SHORTNESS OF BREATH*NOT COVERED* (Patient taking  differently: Inhale 2 puffs into the lungs every 6 (six) hours as needed for wheezing or shortness of breath. ) 8.5 g 1  . atorvastatin (LIPITOR) 10 MG tablet Take 1 tablet (10 mg total) by mouth daily. (Patient taking differently: Take 10 mg by mouth daily with supper. ) 90 tablet 3  . carvedilol (COREG) 12.5 MG tablet TAKE 1 TABLET (12.5 MG TOTAL) BY MOUTH 2 (TWO) TIMES DAILY WITH A MEAL. 180 tablet 2  . [employer] NICOTINE TRANSDERMAL SYS 14 MG/24HR patch PLACE 1 PATCH (14 MG TOTAL) ONTO THE SKIN DAILY. 28 patch 0  . diclofenac sodium (VOLTAREN) 1 % GEL Apply 4 g topically 4 (four) times daily. (Patient taking differently: Apply 4 g topically 4 (four) times daily as needed (pain). ) 4 Tube 3  . dicyclomine (BENTYL) 10 MG capsule TAKE 1 CAPSULE (10 MG TOTAL) BY MOUTH 4 (FOUR) TIMES DAILY - BEFORE MEALS AND AT BEDTIME. 90 capsule 0  . fenofibrate (TRICOR) 48 MG tablet TAKE 1 TABLET BY MOUTH EVERY DAY (Patient taking differently: Take 48 mg by mouth daily after lunch. ) 90 tablet 3  . glucose blood (ONETOUCH VERIO) test strip Use to test 3-4 times daily. 300 strip 4  . levothyroxine (SYNTHROID) 112 MCG tablet TAKE 1 TABLET (112 MCG TOTAL) BY MOUTH DAILY BEFORE BREAKFAST. 90 tablet 2  . linagliptin (TRADJENTA) 5 MG TABS tablet TAKE 1 TABLET (5 MG TOTAL) DAILY BY MOUTH. (Patient taking differently: Take 5 mg by mouth daily after breakfast. ) 90 tablet 1  . losartan (COZAAR) 50 MG tablet Take 2 tablets by mouth daily. (Patient taking differently: Take 100 mg by mouth daily. ) 180 tablet 1  . nitroGLYCERIN (NITROSTAT) 0.4 MG SL tablet Place 1 tablet (0.4 mg total) under the tongue every 5 (five) minutes x 3 doses as needed for chest pain (Max 3 doses within 15 min. Call 911). 10 tablet 0  . pantoprazole (PROTONIX) 40 MG tablet Take 1 tablet (40 mg total) by mouth daily. (Patient taking differently: Take 40 mg by mouth at bedtime. ) 30 tablet 3  . XARELTO 15 MG TABS tablet TAKE 1 TABLET BY MOUTH DAILY WITH SUPPER.  (Patient taking differently: Take 15 mg by mouth daily with supper. ) 30 tablet 3  . potassium chloride SA (KLOR-CON) 20 MEQ tablet 1 tab bid x 2 then 1 tab daily (Patient not taking: Reported on 02/01/2020) 30 tablet 3   No current facility-administered medications for this encounter.    No Known Allergies  Social History   Socioeconomic History  . Marital status: Widowed    Spouse name: Not on file  . Number of children: Not on file  . Years of education: Not on file  . Highest education level: Not on file  Occupational History  . Occupation: retired  Tobacco Use  . Smoking status: Former Smoker    Packs/day: 0.75    Years: 66.00    Pack years: 49.50    Quit date: 12/27/2019    Years since quitting: 0.0  . Smokeless tobacco: Never Used  Substance and Sexual Activity  . Alcohol use: No    Alcohol/week: 0.0 standard drinks  .  Drug use: No  . Sexual activity: Not on file  Other Topics Concern  . Not on file  Social History Narrative  . Not on file   Social Determinants of Health   Financial Resource Strain:   . Difficulty of Paying Living Expenses:   Food Insecurity:   . Worried About Charity fundraiser in the Last Year:   . Arboriculturist in the Last Year:   Transportation Needs:   . Film/video editor (Medical):   Marland Kitchen Lack of Transportation (Non-Medical):   Physical Activity:   . Days of Exercise per Week:   . Minutes of Exercise per Session:   Stress:   . Feeling of Stress :   Social Connections:   . Frequency of Communication with Friends and Family:   . Frequency of Social Gatherings with Friends and Family:   . Attends Religious Services:   . Active Member of Clubs or Organizations:   . Attends Archivist Meetings:   Marland Kitchen Marital Status:   Intimate Partner Violence:   . Fear of Current or Ex-Partner:   . Emotionally Abused:   Marland Kitchen Physically Abused:   . Sexually Abused:     Family History  Problem Relation Age of Onset  . Heart disease  Father   . Heart disease Mother   . Heart attack Mother   . Cirrhosis Brother   . Colon cancer Neg Hx   . Stomach cancer Neg Hx     ROS- All systems are reviewed and negative except as per the HPI above  Physical Exam: Vitals:   02/01/20 1021  BP: (!) 102/44  Pulse: 72  Weight: 50.6 kg  Height: 5\' 2"  (1.575 m)   Wt Readings from Last 3 Encounters:  02/01/20 50.6 kg  01/28/20 49.3 kg  01/06/20 51 kg    Labs: Lab Results  Component Value Date   NA 138 01/19/2020   K 4.7 01/19/2020   CL 103 01/19/2020   CO2 25 01/19/2020   GLUCOSE 123 (H) 01/19/2020   BUN 17 01/19/2020   CREATININE 0.92 01/19/2020   CALCIUM 9.7 01/19/2020   MG 2.0 04/27/2016   Lab Results  Component Value Date   INR 1.14 12/25/2015   Lab Results  Component Value Date   CHOL 156 06/10/2019   HDL 64.30 06/10/2019   LDLCALC 76 06/10/2019   TRIG 79.0 06/10/2019     GEN- The patient is well appearing, alert and oriented x 3 today.   Head- normocephalic, atraumatic Eyes-  Sclera clear, conjunctiva pink Ears- hearing intact Oropharynx- clear Neck- supple, no JVP Lymph- no cervical lymphadenopathy Lungs- Clear to ausculation bilaterally, normal work of breathing Heart- irregular rate and rhythm, no murmurs, rubs or gallops, PMI not laterally displaced GI- soft, NT, ND, + BS Extremities- no clubbing, cyanosis, or edema MS- no significant deformity or atrophy Skin- no rash or lesion Psych- euthymic mood, full affect Neuro- strength and sensation are intact  EKG-afib at 72 bpm, qrs int 86 ms, qtc 431 ms    Assessment and Plan: 1. Atrial  fibrillation  New onset in the setting of hearing the stressful news of new dx of cancer SHe has opted for palliative chemotherapy She iw rate controlled and was unaware until device clijnic brought it to her attention Was on multaq but recently  stopped by Dr. Curt Bears I feel effortsto restore SR will be difficult during the next several months while  pallitive chemo  I think her best  option for now is rate control strategy  Continue carvedilol 12.5 mg bid   2. CHA2DS2VASc score of 6  Continue xarelto 15 mg dailey   Remote checks per scheduled and f/u with Dr. Curt Bears per recall  afib clinic as needed  Geroge Baseman. Kinsie Belford, [facility] [address] [place], [place] [address] [phone]

## 2020-02-04 NOTE — Progress Notes (Signed)
Pharmacist Chemotherapy Monitoring - Initial Assessment    Anticipated start date: 02/08/20   Regimen:  . Are orders appropriate based on the patient's diagnosis, regimen, and cycle? Yes . Does the plan date match the patient's scheduled date? Yes . Is the sequencing of drugs appropriate? Yes . Are the premedications appropriate for the patient's regimen? Yes . Prior Authorization for treatment is: Approved o If applicable, is the correct biosimilar selected based on the patient's insurance? not applicable  Organ Function and Labs: Marland Kitchen Are dose adjustments needed based on the patient's renal function, hepatic function, or hematologic function? No . Are appropriate labs ordered prior to the start of patient's treatment? Yes . Other organ system assessment, if indicated: N/A . The following baseline labs, if indicated, have been ordered: durvalumab: baseline TSH +/- T4  Dose Assessment: . Are the drug doses appropriate? Yes . Are the following correct: o Drug concentrations Yes o IV fluid compatible with drug Yes o Administration routes Yes o Timing of therapy Yes . If applicable, does the patient have documented access for treatment and/or plans for port-a-cath placement? Not documented . If applicable, have lifetime cumulative doses been properly documented and assessed? not applicable - no prior treatment  Lifetime Dose Tracking  No doses have been documented on this patient for the following tracked chemicals: Doxorubicin, Epirubicin, Idarubicin, Daunorubicin, Mitoxantrone, Bleomycin, Oxaliplatin, Carboplatin, Liposomal Doxorubicin  o   Toxicity Monitoring/Prevention: . The patient has the following take home antiemetics prescribed: none currently on medication list - f/u with patient day of tx . The patient has the following take home medications prescribed: N/A . Medication allergies and previous infusion related reactions, if applicable, have been reviewed and addressed.  Yes . The patient's current medication list has been assessed for drug-drug interactions with their chemotherapy regimen. no significant drug-drug interactions were identified on review.  Order Review: . Are the treatment plan orders signed? Yes . Is the patient scheduled to see a provider prior to their treatment? No  I verify that I have reviewed each item in the above checklist and answered each question accordingly.  Britt Boozer 02/04/2020 1:19 PM

## 2020-02-05 ENCOUNTER — Telehealth: Payer: Self-pay | Admitting: Internal Medicine

## 2020-02-05 ENCOUNTER — Inpatient Hospital Stay: Payer: Medicare Other | Attending: Internal Medicine

## 2020-02-05 ENCOUNTER — Other Ambulatory Visit: Payer: Self-pay | Admitting: *Deleted

## 2020-02-05 DIAGNOSIS — E039 Hypothyroidism, unspecified: Secondary | ICD-10-CM | POA: Insufficient documentation

## 2020-02-05 DIAGNOSIS — Z5111 Encounter for antineoplastic chemotherapy: Secondary | ICD-10-CM | POA: Insufficient documentation

## 2020-02-05 DIAGNOSIS — E119 Type 2 diabetes mellitus without complications: Secondary | ICD-10-CM | POA: Insufficient documentation

## 2020-02-05 DIAGNOSIS — C7951 Secondary malignant neoplasm of bone: Secondary | ICD-10-CM | POA: Insufficient documentation

## 2020-02-05 DIAGNOSIS — Z5189 Encounter for other specified aftercare: Secondary | ICD-10-CM | POA: Insufficient documentation

## 2020-02-05 DIAGNOSIS — I10 Essential (primary) hypertension: Secondary | ICD-10-CM | POA: Insufficient documentation

## 2020-02-05 DIAGNOSIS — C3411 Malignant neoplasm of upper lobe, right bronchus or lung: Secondary | ICD-10-CM | POA: Insufficient documentation

## 2020-02-05 DIAGNOSIS — Z5112 Encounter for antineoplastic immunotherapy: Secondary | ICD-10-CM | POA: Insufficient documentation

## 2020-02-05 DIAGNOSIS — I480 Paroxysmal atrial fibrillation: Secondary | ICD-10-CM | POA: Insufficient documentation

## 2020-02-05 DIAGNOSIS — G893 Neoplasm related pain (acute) (chronic): Secondary | ICD-10-CM | POA: Insufficient documentation

## 2020-02-05 MED ORDER — PROCHLORPERAZINE MALEATE 10 MG PO TABS
10.0000 mg | ORAL_TABLET | Freq: Four times a day (QID) | ORAL | 1 refills | Status: AC | PRN
Start: 2020-02-05 — End: ?

## 2020-02-05 NOTE — Telephone Encounter (Signed)
Spoke with [employer]. Would prefer phone call for chemo edu. Edited appt notes with phone number to call

## 2020-02-06 ENCOUNTER — Other Ambulatory Visit: Payer: Self-pay | Admitting: Family Medicine

## 2020-02-06 DIAGNOSIS — K219 Gastro-esophageal reflux disease without esophagitis: Secondary | ICD-10-CM

## 2020-02-08 ENCOUNTER — Telehealth: Payer: Self-pay | Admitting: Medical Oncology

## 2020-02-08 ENCOUNTER — Inpatient Hospital Stay: Payer: Medicare Other

## 2020-02-08 ENCOUNTER — Other Ambulatory Visit: Payer: Self-pay

## 2020-02-08 NOTE — Telephone Encounter (Signed)
Trilaciclib is not prior authorized . Pt late for appts.   "What is prognosis without treatment -time frame".   She does not feel sick at all is is worried about side effects. Has more questions.  Appt with [facility] on wed   followed by  tx .

## 2020-02-08 NOTE — Telephone Encounter (Signed)
dtr notified of prognosis, without tx ,of less than 3 months,

## 2020-02-08 NOTE — Telephone Encounter (Signed)
Her prognosis without treatment is less than 3 months.

## 2020-02-08 NOTE — Telephone Encounter (Signed)
Trilaciclib is not PA - Pt  did not show up today for appts.  Does Mohamed want to r/s her for wed?

## 2020-02-09 ENCOUNTER — Other Ambulatory Visit: Payer: Self-pay

## 2020-02-09 ENCOUNTER — Inpatient Hospital Stay: Payer: Medicare Other

## 2020-02-09 ENCOUNTER — Encounter: Payer: Self-pay | Admitting: Internal Medicine

## 2020-02-09 DIAGNOSIS — I10 Essential (primary) hypertension: Secondary | ICD-10-CM | POA: Diagnosis not present

## 2020-02-09 DIAGNOSIS — E039 Hypothyroidism, unspecified: Secondary | ICD-10-CM | POA: Diagnosis not present

## 2020-02-09 DIAGNOSIS — I480 Paroxysmal atrial fibrillation: Secondary | ICD-10-CM | POA: Diagnosis not present

## 2020-02-09 DIAGNOSIS — C3491 Malignant neoplasm of unspecified part of right bronchus or lung: Secondary | ICD-10-CM

## 2020-02-09 DIAGNOSIS — Z5189 Encounter for other specified aftercare: Secondary | ICD-10-CM | POA: Diagnosis not present

## 2020-02-09 DIAGNOSIS — E119 Type 2 diabetes mellitus without complications: Secondary | ICD-10-CM | POA: Diagnosis not present

## 2020-02-09 DIAGNOSIS — C7951 Secondary malignant neoplasm of bone: Secondary | ICD-10-CM | POA: Diagnosis not present

## 2020-02-09 DIAGNOSIS — Z5112 Encounter for antineoplastic immunotherapy: Secondary | ICD-10-CM | POA: Diagnosis not present

## 2020-02-09 DIAGNOSIS — G893 Neoplasm related pain (acute) (chronic): Secondary | ICD-10-CM | POA: Diagnosis not present

## 2020-02-09 DIAGNOSIS — C3411 Malignant neoplasm of upper lobe, right bronchus or lung: Secondary | ICD-10-CM | POA: Diagnosis not present

## 2020-02-09 DIAGNOSIS — Z5111 Encounter for antineoplastic chemotherapy: Secondary | ICD-10-CM | POA: Diagnosis not present

## 2020-02-09 LAB — CBC WITH DIFFERENTIAL (CANCER CENTER ONLY)
Abs Immature Granulocytes: 0.02 10*3/uL (ref 0.00–0.07)
Basophils Absolute: 0 10*3/uL (ref 0.0–0.1)
Basophils Relative: 1 %
Eosinophils Absolute: 0.1 10*3/uL (ref 0.0–0.5)
Eosinophils Relative: 2 %
HCT: 33.1 % — ABNORMAL LOW (ref 36.0–46.0)
Hemoglobin: 10.3 g/dL — ABNORMAL LOW (ref 12.0–15.0)
Immature Granulocytes: 0 %
Lymphocytes Relative: 26 %
Lymphs Abs: 1.4 10*3/uL (ref 0.7–4.0)
MCH: 28.1 pg (ref 26.0–34.0)
MCHC: 31.1 g/dL (ref 30.0–36.0)
MCV: 90.4 fL (ref 80.0–100.0)
Monocytes Absolute: 0.7 10*3/uL (ref 0.1–1.0)
Monocytes Relative: 12 %
Neutro Abs: 3.3 10*3/uL (ref 1.7–7.7)
Neutrophils Relative %: 59 %
Platelet Count: 182 10*3/uL (ref 150–400)
RBC: 3.66 MIL/uL — ABNORMAL LOW (ref 3.87–5.11)
RDW: 14.7 % (ref 11.5–15.5)
WBC Count: 5.6 10*3/uL (ref 4.0–10.5)
nRBC: 0 % (ref 0.0–0.2)

## 2020-02-09 LAB — CMP (CANCER CENTER ONLY)
ALT: 11 U/L (ref 0–44)
AST: 14 U/L — ABNORMAL LOW (ref 15–41)
Albumin: 2.8 g/dL — ABNORMAL LOW (ref 3.5–5.0)
Alkaline Phosphatase: 54 U/L (ref 38–126)
Anion gap: 7 (ref 5–15)
BUN: 17 mg/dL (ref 8–23)
CO2: 26 mmol/L (ref 22–32)
Calcium: 9.1 mg/dL (ref 8.9–10.3)
Chloride: 105 mmol/L (ref 98–111)
Creatinine: 0.82 mg/dL (ref 0.44–1.00)
GFR, Est AFR Am: >60 mL/min (ref >60)
GFR, Estimated: >60 mL/min (ref >60)
Glucose, Bld: 148 mg/dL — ABNORMAL HIGH (ref 70–99)
Potassium: 4.1 mmol/L (ref 3.5–5.1)
Sodium: 138 mmol/L (ref 135–145)
Total Bilirubin: 0.2 mg/dL — ABNORMAL LOW (ref 0.3–1.2)
Total Protein: 6.7 g/dL (ref 6.5–8.1)

## 2020-02-09 LAB — TSH: TSH: 2.937 u[IU]/mL (ref 0.308–3.960)

## 2020-02-09 NOTE — Progress Notes (Signed)
Met with patient at registration to introduce myself as Financial Resource Specialist and to offer available resources.  Discussed one-time $1000 [employer] grant and qualifications to assist with personal expenses while going through treatment.  Gave her my card if interested in applying and for any additional financial questions or concerns.   

## 2020-02-10 ENCOUNTER — Inpatient Hospital Stay: Payer: Medicare Other

## 2020-02-10 ENCOUNTER — Encounter: Payer: Self-pay | Admitting: Internal Medicine

## 2020-02-10 ENCOUNTER — Other Ambulatory Visit: Payer: Self-pay

## 2020-02-10 ENCOUNTER — Ambulatory Visit (INDEPENDENT_AMBULATORY_CARE_PROVIDER_SITE_OTHER): Payer: Medicare Other | Admitting: *Deleted

## 2020-02-10 ENCOUNTER — Inpatient Hospital Stay (HOSPITAL_BASED_OUTPATIENT_CLINIC_OR_DEPARTMENT_OTHER): Payer: Medicare Other | Admitting: Internal Medicine

## 2020-02-10 ENCOUNTER — Other Ambulatory Visit: Payer: Self-pay | Admitting: Medical Oncology

## 2020-02-10 ENCOUNTER — Ambulatory Visit: Payer: Medicare Other

## 2020-02-10 VITALS — BP 102/53 | HR 64 | Temp 98.2°F | Resp 18 | Ht 62.0 in | Wt 113.8 lb

## 2020-02-10 DIAGNOSIS — C3491 Malignant neoplasm of unspecified part of right bronchus or lung: Secondary | ICD-10-CM

## 2020-02-10 DIAGNOSIS — Z5189 Encounter for other specified aftercare: Secondary | ICD-10-CM | POA: Diagnosis not present

## 2020-02-10 DIAGNOSIS — I10 Essential (primary) hypertension: Secondary | ICD-10-CM | POA: Diagnosis not present

## 2020-02-10 DIAGNOSIS — I495 Sick sinus syndrome: Secondary | ICD-10-CM

## 2020-02-10 DIAGNOSIS — E119 Type 2 diabetes mellitus without complications: Secondary | ICD-10-CM | POA: Diagnosis not present

## 2020-02-10 DIAGNOSIS — Z5111 Encounter for antineoplastic chemotherapy: Secondary | ICD-10-CM

## 2020-02-10 DIAGNOSIS — C7951 Secondary malignant neoplasm of bone: Secondary | ICD-10-CM | POA: Diagnosis not present

## 2020-02-10 DIAGNOSIS — G893 Neoplasm related pain (acute) (chronic): Secondary | ICD-10-CM | POA: Diagnosis not present

## 2020-02-10 DIAGNOSIS — Z5112 Encounter for antineoplastic immunotherapy: Secondary | ICD-10-CM

## 2020-02-10 DIAGNOSIS — I480 Paroxysmal atrial fibrillation: Secondary | ICD-10-CM | POA: Diagnosis not present

## 2020-02-10 DIAGNOSIS — C3411 Malignant neoplasm of upper lobe, right bronchus or lung: Secondary | ICD-10-CM | POA: Diagnosis not present

## 2020-02-10 DIAGNOSIS — I878 Other specified disorders of veins: Secondary | ICD-10-CM

## 2020-02-10 DIAGNOSIS — E039 Hypothyroidism, unspecified: Secondary | ICD-10-CM | POA: Diagnosis not present

## 2020-02-10 LAB — CUP PACEART REMOTE DEVICE CHECK
Battery Remaining Longevity: 24 mo
Battery Remaining Percentage: 43 %
Brady Statistic RA Percent Paced: 74 %
Brady Statistic RV Percent Paced: 15 %
Date Time Interrogation Session: 20210511021100
Implantable Lead Implant Date: 19990610
Implantable Lead Implant Date: 20160713
Implantable Lead Location: 753859
Implantable Lead Location: 753860
Implantable Lead Model: 4136
Implantable Lead Serial Number: 29813363
Implantable Pulse Generator Implant Date: 20150130
Lead Channel Impedance Value: 340 Ohm
Lead Channel Impedance Value: 561 Ohm
Lead Channel Pacing Threshold Amplitude: 0.7 V
Lead Channel Pacing Threshold Amplitude: 1.1 V
Lead Channel Pacing Threshold Pulse Width: 0.4 ms
Lead Channel Pacing Threshold Pulse Width: 0.5 ms
Lead Channel Setting Pacing Amplitude: 1.2 V
Lead Channel Setting Pacing Amplitude: 2 V
Lead Channel Setting Pacing Pulse Width: 0.4 ms
Lead Channel Setting Sensing Sensitivity: 2 mV
Pulse Gen Serial Number: 393477

## 2020-02-10 MED ORDER — SODIUM CHLORIDE 0.9 % IV SOLN
10.0000 mg | Freq: Once | INTRAVENOUS | Status: AC
  Administered 2020-02-10: 10 mg via INTRAVENOUS
  Filled 2020-02-10: qty 1
  Filled 2020-02-10: qty 10

## 2020-02-10 MED ORDER — SODIUM CHLORIDE 0.9 % IV SOLN
100.0000 mg/m2 | Freq: Once | INTRAVENOUS | Status: AC
  Administered 2020-02-10: 150 mg via INTRAVENOUS
  Filled 2020-02-10: qty 7.5

## 2020-02-10 MED ORDER — PALONOSETRON HCL INJECTION 0.25 MG/5ML
0.2500 mg | Freq: Once | INTRAVENOUS | Status: AC
  Administered 2020-02-10: 0.25 mg via INTRAVENOUS

## 2020-02-10 MED ORDER — SODIUM CHLORIDE 0.9 % IV SOLN
294.0000 mg | Freq: Once | INTRAVENOUS | Status: AC
  Administered 2020-02-10: 290 mg via INTRAVENOUS
  Filled 2020-02-10: qty 29

## 2020-02-10 MED ORDER — SODIUM CHLORIDE 0.9 % IV SOLN
1500.0000 mg | Freq: Once | INTRAVENOUS | Status: AC
  Administered 2020-02-10: 1500 mg via INTRAVENOUS
  Filled 2020-02-10: qty 30

## 2020-02-10 MED ORDER — OXYCODONE-ACETAMINOPHEN 5-325 MG PO TABS: 1.0000 | ORAL_TABLET | Freq: Three times a day (TID) | ORAL | 0 refills | Status: DC | PRN

## 2020-02-10 MED ORDER — PALONOSETRON HCL INJECTION 0.25 MG/5ML
INTRAVENOUS | Status: AC
  Filled 2020-02-10: qty 5

## 2020-02-10 MED ORDER — SODIUM CHLORIDE 0.9 % IV SOLN
150.0000 mg | Freq: Once | INTRAVENOUS | Status: AC
  Administered 2020-02-10: 150 mg via INTRAVENOUS
  Filled 2020-02-10: qty 150
  Filled 2020-02-10: qty 5

## 2020-02-10 MED ORDER — SODIUM CHLORIDE 0.9 % IV SOLN
Freq: Once | INTRAVENOUS | Status: AC
  Filled 2020-02-10: qty 250

## 2020-02-10 NOTE — Progress Notes (Signed)
Spoke w/ Dr. Julien Nordmann - [name] was denied by patient's insurance due to it not being a covered drug for her plan. Okay to add Udenyca back per Dr. Julien Nordmann.   Demetrius Charity, PharmD, BCPS, [place] [facility] Pharmacist Pharmacy Phone: [phone] 02/10/2020

## 2020-02-10 NOTE — Progress Notes (Signed)
Pt receiving etoposide infusion, was started at 1600.  Nursing tech noted fluid in the floor under patients chair at 1642, tubing to etoposide had come apart at filter, infusion stopped.  Bag appeared to be half empty.  Pt's blanket & jeans noted to be wet, fluid noted on floor, unable to estimate volume of spill.   Spill cleaned per protocol with chemo spill kit.  Pt changed pants, denied pain or skin irritation.  Pharmacy & Dr. Julien Nordmann informed of spill.

## 2020-02-10 NOTE — Progress Notes (Signed)
[facility] [phone]   [phone]  OFFICE PROGRESS NOTE  [name], [name], MD [address] [place] [address]  DIAGNOSIS: Extensive stage (T2 a, N2, M1 C) small cell lung cancer presented with central right upper lobe lung mass in addition to left lower lobe pulmonary metastasis and bilateral hilar, subcarinal and bilateral paratracheal and left prevascular lymphadenopathy in addition to bone metastasis in the left iliac wing diagnosed in April 2021.  PRIOR THERAPY: None  CURRENT THERAPY: Systemic chemotherapy with carboplatin for AUC of 5 on day 1, etoposide 100 mg/M2 on days 1, 2 and 3 with Neulasta support in addition to Imfinzi 1500 mg IV every 3 weeks during chemotherapy followed by maintenance Imfinzi 1500 mg IV every 4 weeks after cycle #4.   INTERVAL HISTORY: [name] 85 y.o. female returns to the clinic today for follow-up visit accompanied by her daughter.  The patient is feeling fine today with no concerning complaints except for occasional pain in the substernal area.  She denied having any current shortness of breath but has mild cough with no hemoptysis.  She denied having any fever or chills.  She has no nausea, vomiting, diarrhea or constipation.  She denied having any headache or visual changes.  She has few question about her chemotherapy and the prognosis with and without treatment and the patient is here today for reevaluation and discussion before starting her treatment today.  MEDICAL HISTORY: Past Medical History:  Diagnosis Date  . CAD (coronary artery disease), native coronary artery    PTCA of distal right coronary artery 1997 Cypher stents to circumflex 2005 Cardiac cath in 2011 with patent stent to circumflex and moderate disease elsewhere treated medically   . Cancer ([place])   . Cardiac pacemaker in situ 12/24/2015   Original implant reportedly in 1991 for tachybradycardia syndrome, generator change in 1999,  2004 and evidently again in 2016 in [place]   . COPD (chronic obstructive pulmonary disease) ([place])   . Diabetes mellitus without complication ([place])   . Hypertension   . Hypothyroidism 03/23/2010  . Pacemaker   . Paroxysmal atrial fibrillation ([place]) 03/23/2010   CHA2DS2VASC score 5     ALLERGIES:  has No Known Allergies.  MEDICATIONS:  Current Outpatient Medications  Medication Sig Dispense Refill  . acetaminophen (TYLENOL) 500 MG tablet Take 500 mg by mouth every 6 (six) hours as needed for headache.    . albuterol (VENTOLIN HFA) 108 (90 Base) MCG/ACT inhaler TAKE 2 PUFFS BY MOUTH EVERY 6 HOURS AS NEEDED FOR WHEEZE OR SHORTNESS OF BREATH*NOT COVERED* (Patient taking differently: Inhale 2 puffs into the lungs every 6 (six) hours as needed for wheezing or shortness of breath. ) 8.5 g 1  . atorvastatin (LIPITOR) 10 MG tablet TAKE 1 TABLET BY MOUTH EVERY DAY 90 tablet 3  . carvedilol (COREG) 12.5 MG tablet TAKE 1 TABLET (12.5 MG TOTAL) BY MOUTH 2 (TWO) TIMES DAILY WITH A MEAL. 180 tablet 2  . [employer] NICOTINE TRANSDERMAL SYS 14 MG/24HR patch PLACE 1 PATCH (14 MG TOTAL) ONTO THE SKIN DAILY. 28 patch 0  . diclofenac sodium (VOLTAREN) 1 % GEL Apply 4 g topically 4 (four) times daily. (Patient taking differently: Apply 4 g topically 4 (four) times daily as needed (pain). ) 4 Tube 3  . dicyclomine (BENTYL) 10 MG capsule TAKE 1 CAPSULE (10 MG TOTAL) BY MOUTH 4 (FOUR) TIMES DAILY - BEFORE MEALS AND AT BEDTIME. 90 capsule 0  .  fenofibrate (TRICOR) 48 MG tablet TAKE 1 TABLET BY MOUTH EVERY DAY (Patient taking differently: Take 48 mg by mouth daily after lunch. ) 90 tablet 3  . glucose blood (ONETOUCH VERIO) test strip Use to test 3-4 times daily. 300 strip 4  . levothyroxine (SYNTHROID) 112 MCG tablet TAKE 1 TABLET (112 MCG TOTAL) BY MOUTH DAILY BEFORE BREAKFAST. 90 tablet 2  . linagliptin (TRADJENTA) 5 MG TABS tablet TAKE 1 TABLET (5 MG TOTAL) DAILY BY MOUTH. (Patient taking differently: Take 5 mg by mouth  daily after breakfast. ) 90 tablet 1  . losartan (COZAAR) 50 MG tablet Take 2 tablets by mouth daily. (Patient taking differently: Take 100 mg by mouth daily. ) 180 tablet 1  . nitroGLYCERIN (NITROSTAT) 0.4 MG SL tablet Place 1 tablet (0.4 mg total) under the tongue every 5 (five) minutes x 3 doses as needed for chest pain (Max 3 doses within 15 min. Call 911). 10 tablet 0  . pantoprazole (PROTONIX) 40 MG tablet TAKE 1 TABLET BY MOUTH EVERY DAY 90 tablet 1  . potassium chloride SA (KLOR-CON) 20 MEQ tablet 1 tab bid x 2 then 1 tab daily (Patient not taking: Reported on 02/01/2020) 30 tablet 3  . prochlorperazine (COMPAZINE) 10 MG tablet Take 1 tablet (10 mg total) by mouth every 6 (six) hours as needed for nausea or vomiting. 30 tablet 1  . XARELTO 15 MG TABS tablet TAKE 1 TABLET BY MOUTH DAILY WITH SUPPER. (Patient taking differently: Take 15 mg by mouth daily with supper. ) 30 tablet 3   No current facility-administered medications for this visit.    SURGICAL HISTORY:  Past Surgical History:  Procedure Laterality Date  . BIOPSY  01/08/2020   Procedure: BIOPSY;  Surgeon: Garner Nash, DO;  Location: [facility];  Service: Pulmonary;;  . BRONCHIAL BRUSHINGS  01/08/2020   Procedure: BRONCHIAL BRUSHINGS;  Surgeon: Garner Nash, DO;  Location: [facility];  Service: Pulmonary;;  . BRONCHIAL WASHINGS  01/08/2020   Procedure: BRONCHIAL WASHINGS;  Surgeon: Garner Nash, DO;  Location: [facility];  Service: Pulmonary;;  . CARDIAC CATHETERIZATION N/A 12/26/2015   Procedure: Left Heart Cath and Coronary Angiography;  Surgeon: Peter M [name], MD;  Location: [facility];  Service: Cardiovascular;  Laterality: N/A;  . CARDIOVERSION  05/2015  . CHOLECYSTECTOMY  2012  . CORONARY ANGIOPLASTY WITH STENT PLACEMENT  1990  . ENDOBRONCHIAL ULTRASOUND  01/08/2020   Procedure: ENDOBRONCHIAL ULTRASOUND;  Surgeon: Garner Nash, DO;  Location: [facility];  Service: Pulmonary;;  . FINE NEEDLE  ASPIRATION BIOPSY  01/08/2020   Procedure: FINE NEEDLE ASPIRATION BIOPSY;  Surgeon: Garner Nash, DO;  Location: [facility];  Service: Pulmonary;;  . HERNIA REPAIR    . PACEMAKER GENERATOR CHANGE  [id number], 2016  . PACEMAKER INSERTION  1991  . VIDEO BRONCHOSCOPY WITH ENDOBRONCHIAL ULTRASOUND N/A 01/08/2020   Procedure: VIDEO BRONCHOSCOPY;  Surgeon: Garner Nash, DO;  Location: [facility];  Service: Pulmonary;  Laterality: N/A;    REVIEW OF SYSTEMS:  A comprehensive review of systems was negative except for: Constitutional: positive for fatigue Respiratory: positive for pleurisy/chest pain   PHYSICAL EXAMINATION: General appearance: alert, cooperative, appears stated age, fatigued and no distress Head: Normocephalic, without obvious abnormality, atraumatic Neck: no adenopathy, no JVD, supple, symmetrical, trachea midline and thyroid not enlarged, symmetric, no tenderness/mass/nodules Lymph nodes: Cervical, supraclavicular, and axillary nodes normal. Resp: clear to auscultation bilaterally Back: symmetric, no curvature. ROM normal. No CVA tenderness. Cardio: regular rate  and rhythm, S1, S2 normal, no murmur, click, rub or gallop GI: soft, non-tender; bowel sounds normal; no masses,  no organomegaly Extremities: extremities normal, atraumatic, no cyanosis or edema  ECOG PERFORMANCE STATUS: 1 - Symptomatic but completely ambulatory  Blood pressure (!) 102/53, pulse 64, temperature 98.2 F (36.8 C), temperature source Temporal, resp. rate 18, height 5\' 2"  (1.575 m), weight 113 lb 12.8 oz (51.6 kg), SpO2 100 %.  LABORATORY DATA: Lab Results  Component Value Date   WBC 5.6 02/09/2020   HGB 10.3 (L) 02/09/2020   HCT 33.1 (L) 02/09/2020   MCV 90.4 02/09/2020   PLT 182 02/09/2020      Chemistry      Component Value Date/Time   NA 138 02/09/2020 1058   K 4.1 02/09/2020 1058   CL 105 02/09/2020 1058   CO2 26 02/09/2020 1058   BUN 17 02/09/2020 1058   CREATININE 0.82  02/09/2020 1058      Component Value Date/Time   CALCIUM 9.1 02/09/2020 1058   ALKPHOS 54 02/09/2020 1058   AST 14 (L) 02/09/2020 1058   ALT 11 02/09/2020 1058   BILITOT 0.2 (L) 02/09/2020 1058       RADIOGRAPHIC STUDIES: NM PET Image Initial (PI) Skull Base To Thigh  Result Date: 01/15/2020 CLINICAL DATA:  Initial treatment strategy for recently diagnosed small cell lung carcinoma on right mainstem endobronchial biopsy 01/08/2020. EXAM: NUCLEAR MEDICINE PET SKULL BASE TO THIGH TECHNIQUE: 5.6 mCi F-18 FDG was injected intravenously. Full-ring PET imaging was performed from the skull base to thigh after the radiotracer. CT data was obtained and used for attenuation correction and anatomic localization. Fasting blood glucose: 85 mg/dl COMPARISON:  12/27/2019 CT angiogram of the chest, abdomen and pelvis. FINDINGS: Mediastinal blood pool activity: SUV max 2.6 Liver activity: SUV max NA NECK: No hypermetabolic lymph nodes in the neck. Incidental CT findings: none CHEST: Hypermetabolic irregular solid central right upper lobe 3.2 cm lung mass (series 8/image 22) with max SUV 20.8, contiguous with the right hilum. Hypermetabolic 2.7 cm solid peripheral left lower lobe pulmonary nodule (series 8/image 34) with max SUV 11.0. Hypermetabolic subcarinal, bilateral paratracheal and left prevascular lymphadenopathy. Representative 2.0 cm left prevascular node with max SUV 10.1 (series 4/image 55). Hypermetabolic 3.0 cm subcarinal node with max SUV 19.0 (series 4/image 63). Representative high left paratracheal 0.8 cm node with max SUV 9.9 (series 4/image 45). Representative right paratracheal 2.1 cm node with max SUV 18.7 (series 4/image 60). Hypermetabolic left hilar node with max SUV 11.7. Incidental CT findings: Three-vessel coronary atherosclerosis. Two lead right subclavian ICD with lead tips in the right ventricle. Mild cardiomegaly. Atherosclerotic nonaneurysmal thoracic aorta. Marked centrilobular and  paraseptal emphysema. ABDOMEN/PELVIS: No abnormal hypermetabolic activity within the liver, pancreas, adrenal glands, or spleen. No hypermetabolic lymph nodes in the abdomen or pelvis. Incidental CT findings: Cholecystectomy. Atherosclerotic nonaneurysmal abdominal aorta. SKELETON: Hypermetabolic lytic 1.4 cm left iliac wing lesion with max SUV 8.9 (series 4/image 138). No additional hypermetabolic osseous lesions. Incidental CT findings: Fixation hardware in the proximal right femur. IMPRESSION: 1. Hypermetabolic 3.2 cm irregular central right upper lobe lung mass compatible with primary bronchogenic carcinoma. 2. Hypermetabolic 2.7 cm left lower lobe contralateral pulmonary metastasis. 3. Hypermetabolic nodal metastatic disease in the bilateral hilar, subcarinal, bilateral paratracheal and left prevascular chains. 4. Solitary hypermetabolic bone metastasis to the left iliac wing. 5. Chronic findings include: Aortic Atherosclerosis (ICD10-I70.0) and Emphysema (ICD10-J43.9). Coronary atherosclerosis. Electronically Signed   By: Ilona Sorrel M.D.   On:  01/15/2020 15:12   CUP PACEART REMOTE DEVICE CHECK  Result Date: 02/10/2020 Scheduled remote reviewed. Normal device function.  8% AF burden. Known PAF, takes Xarelto. Next remote 91 days. Felisa Bonier, RN, MSNMay [phone], 2021 16:15 - Yellow Alert - Atrial Arrhythmia Burden of at least 6.0 hours in a 24 hour period.   ASSESSMENT AND PLAN: This is a very pleasant 85 years old African-American female recently diagnosed with extensive stage small cell lung cancer presented with central right upper lobe lung mass in addition to left lower lobe lung nodule and mediastinal lymphadenopathy and metastatic bone lesion in the left iliac wing diagnosed in April 2021. I had a lengthy discussion with the patient and her daughter today about her current disease status and prognosis with and without treatment. The patient understand that without treatment her average survival is  around 3 months and was treatment is around 1 year.  She also understands that this treatment is not curative in nature. The patient would like to proceed with the treatment as planned and she will start the first dose of her treatment today with carboplatin for AUC of 5 on day 1, etoposide 100 mg/M2 on days 1, 2 and 3 with Neulasta support in addition to Imfinzi 1500 mg every 3 weeks during the chemotherapy course followed by 1500 mg every 4 weeks as maintenance if no evidence for disease progression. Her insurance declined the treatment with [facility]. She will come back for follow-up visit in 1 week for evaluation and management of any adverse effect of her treatment. For the substernal chest pain from the mediastinal lymphadenopathy, I will start the patient on Percocet on as-needed basis. She was advised to call immediately if she has any concerning symptoms in the interval. The patient voices understanding of current disease status and treatment options and is in agreement with the current care plan.  All questions were answered. The patient knows to call the clinic with any problems, questions or concerns. We can certainly see the patient much sooner if necessary.  The total time spent in the appointment was 50 minutes.  Disclaimer: This note was dictated with voice recognition software. Similar sounding words can inadvertently be transcribed and may not be corrected upon review.

## 2020-02-10 NOTE — Patient Instructions (Signed)
[facility] Discharge Instructions for Patients Receiving Chemotherapy  Today you received the following chemotherapy agents Imfinzi, Carboplatin, Etoposide  To help prevent nausea and vomiting after your treatment, we encourage you to take your nausea medication as prescribed.   If you develop nausea and vomiting that is not controlled by your nausea medication, call the clinic.   BELOW ARE SYMPTOMS THAT SHOULD BE REPORTED IMMEDIATELY:  *FEVER GREATER THAN 100.5 F  *CHILLS WITH OR WITHOUT FEVER  NAUSEA AND VOMITING THAT IS NOT CONTROLLED WITH YOUR NAUSEA MEDICATION  *UNUSUAL SHORTNESS OF BREATH  *UNUSUAL BRUISING OR BLEEDING  TENDERNESS IN MOUTH AND THROAT WITH OR WITHOUT PRESENCE OF ULCERS  *URINARY PROBLEMS  *BOWEL PROBLEMS  UNUSUAL RASH Items with * indicate a potential emergency and should be followed up as soon as possible.  Feel free to call the clinic should you have any questions or concerns. The clinic phone number is [phone].  Please show the [facility] at check-in to the Emergency Department and triage nurse.  Durvalumab injection What is this medicine? DURVALUMAB (dur VAL ue mab) is a monoclonal antibody. It is used to treat urothelial cancer and lung cancer. This medicine may be used for other purposes; ask your health care provider or pharmacist if you have questions. COMMON BRAND NAME(S): IMFINZI What should I tell my health care provider before I take this medicine? They need to know if you have any of these conditions:  diabetes  immune system problems  infection  inflammatory bowel disease  kidney disease  liver disease  lung or breathing disease  lupus  organ transplant  stomach or intestine problems  thyroid disease  an unusual or allergic reaction to durvalumab, other medicines, foods, dyes, or preservatives  pregnant or trying to get pregnant  breast-feeding How should I use this medicine? This  medicine is for infusion into a vein. It is given by a health care professional in a hospital or clinic setting. A special MedGuide will be given to you before each treatment. Be sure to read this information carefully each time. Talk to your pediatrician regarding the use of this medicine in children. Special care may be needed. Overdosage: If you think you have taken too much of this medicine contact a poison control center or emergency room at once. NOTE: This medicine is only for you. Do not share this medicine with others. What if I miss a dose? It is important not to miss your dose. Call your doctor or health care professional if you are unable to keep an appointment. What may interact with this medicine? Interactions have not been studied. This list may not describe all possible interactions. Give your health care provider a list of all the medicines, herbs, non-prescription drugs, or dietary supplements you use. Also tell them if you smoke, drink alcohol, or use illegal drugs. Some items may interact with your medicine. What should I watch for while using this medicine? This drug may make you feel generally unwell. Continue your course of treatment even though you feel ill unless your doctor tells you to stop. You may need blood work done while you are taking this medicine. Do not become pregnant while taking this medicine or for 3 months after stopping it. Women should inform their doctor if they wish to become pregnant or think they might be pregnant. There is a potential for serious side effects to an unborn child. Talk to your health care professional or pharmacist for more information. Do  not breast-feed an infant while taking this medicine or for 3 months after stopping it. What side effects may I notice from receiving this medicine? Side effects that you should report to your doctor or health care professional as soon as possible:  allergic reactions like skin rash, itching or hives,  swelling of the face, lips, or tongue  black, tarry stools  bloody or watery diarrhea  breathing problems  change in emotions or moods  change in sex drive  changes in vision  chest pain or chest tightness  chills  confusion  cough  facial flushing  fever  headache  signs and symptoms of high blood sugar such as dizziness; dry mouth; dry skin; fruity breath; nausea; stomach pain; increased hunger or thirst; increased urination  signs and symptoms of liver injury like dark yellow or brown urine; general ill feeling or flu-like symptoms; light-colored stools; loss of appetite; nausea; right upper belly pain; unusually weak or tired; yellowing of the eyes or skin  stomach pain  trouble passing urine or change in the amount of urine  weight gain or weight loss Side effects that usually do not require medical attention (report these to your doctor or health care professional if they continue or are bothersome):  bone pain  constipation  loss of appetite  muscle pain  nausea  swelling of the ankles, feet, hands  tiredness This list may not describe all possible side effects. Call your doctor for medical advice about side effects. You may report side effects to FDA at 1-800-FDA-1088. Where should I keep my medicine? This drug is given in a hospital or clinic and will not be stored at home. NOTE: This sheet is a summary. It may not cover all possible information. If you have questions about this medicine, talk to your doctor, pharmacist, or health care provider.  2020 Elsevier/Gold Standard (2016-11-27 19:25:04)  Carboplatin injection What is this medicine? CARBOPLATIN (KAR boe pla tin) is a chemotherapy drug. It targets fast dividing cells, like cancer cells, and causes these cells to die. This medicine is used to treat ovarian cancer and many other cancers. This medicine may be used for other purposes; ask your health care provider or pharmacist if you have  questions. COMMON BRAND NAME(S): Paraplatin What should I tell my health care provider before I take this medicine? They need to know if you have any of these conditions:  blood disorders  hearing problems  kidney disease  recent or ongoing radiation therapy  an unusual or allergic reaction to carboplatin, cisplatin, other chemotherapy, other medicines, foods, dyes, or preservatives  pregnant or trying to get pregnant  breast-feeding How should I use this medicine? This drug is usually given as an infusion into a vein. It is administered in a hospital or clinic by a specially trained health care professional. Talk to your pediatrician regarding the use of this medicine in children. Special care may be needed. Overdosage: If you think you have taken too much of this medicine contact a poison control center or emergency room at once. NOTE: This medicine is only for you. Do not share this medicine with others. What if I miss a dose? It is important not to miss a dose. Call your doctor or health care professional if you are unable to keep an appointment. What may interact with this medicine?  medicines for seizures  medicines to increase blood counts like filgrastim, pegfilgrastim, sargramostim  some antibiotics like amikacin, gentamicin, neomycin, streptomycin, tobramycin  vaccines Talk to your  doctor or health care professional before taking any of these medicines:  acetaminophen  aspirin  ibuprofen  ketoprofen  naproxen This list may not describe all possible interactions. Give your health care provider a list of all the medicines, herbs, non-prescription drugs, or dietary supplements you use. Also tell them if you smoke, drink alcohol, or use illegal drugs. Some items may interact with your medicine. What should I watch for while using this medicine? Your condition will be monitored carefully while you are receiving this medicine. You will need important blood work done  while you are taking this medicine. This drug may make you feel generally unwell. This is not uncommon, as chemotherapy can affect healthy cells as well as cancer cells. Report any side effects. Continue your course of treatment even though you feel ill unless your doctor tells you to stop. In some cases, you may be given additional medicines to help with side effects. Follow all directions for their use. Call your doctor or health care professional for advice if you get a fever, chills or sore throat, or other symptoms of a cold or flu. Do not treat yourself. This drug decreases your body's ability to fight infections. Try to avoid being around people who are sick. This medicine may increase your risk to bruise or bleed. Call your doctor or health care professional if you notice any unusual bleeding. Be careful brushing and flossing your teeth or using a toothpick because you may get an infection or bleed more easily. If you have any dental work done, tell your dentist you are receiving this medicine. Avoid taking products that contain aspirin, acetaminophen, ibuprofen, naproxen, or ketoprofen unless instructed by your doctor. These medicines may hide a fever. Do not become pregnant while taking this medicine. Women should inform their doctor if they wish to become pregnant or think they might be pregnant. There is a potential for serious side effects to an unborn child. Talk to your health care professional or pharmacist for more information. Do not breast-feed an infant while taking this medicine. What side effects may I notice from receiving this medicine? Side effects that you should report to your doctor or health care professional as soon as possible:  allergic reactions like skin rash, itching or hives, swelling of the face, lips, or tongue  signs of infection - fever or chills, cough, sore throat, pain or difficulty passing urine  signs of decreased platelets or bleeding - bruising, pinpoint  red spots on the skin, black, tarry stools, nosebleeds  signs of decreased red blood cells - unusually weak or tired, fainting spells, lightheadedness  breathing problems  changes in hearing  changes in vision  chest pain  high blood pressure  low blood counts - This drug may decrease the number of white blood cells, red blood cells and platelets. You may be at increased risk for infections and bleeding.  nausea and vomiting  pain, swelling, redness or irritation at the injection site  pain, tingling, numbness in the hands or feet  problems with balance, talking, walking  trouble passing urine or change in the amount of urine Side effects that usually do not require medical attention (report to your doctor or health care professional if they continue or are bothersome):  hair loss  loss of appetite  metallic taste in the mouth or changes in taste This list may not describe all possible side effects. Call your doctor for medical advice about side effects. You may report side effects to FDA at  1-800-FDA-1088. Where should I keep my medicine? This drug is given in a hospital or clinic and will not be stored at home. NOTE: This sheet is a summary. It may not cover all possible information. If you have questions about this medicine, talk to your doctor, pharmacist, or health care provider.  2020 Elsevier/Gold Standard (2007-12-23 14:38:05)  Etoposide, VP-16 injection What is this medicine? ETOPOSIDE, VP-16 (e toe POE side) is a chemotherapy drug. It is used to treat testicular cancer, lung cancer, and other cancers. This medicine may be used for other purposes; ask your health care provider or pharmacist if you have questions. COMMON BRAND NAME(S): Etopophos, Toposar, VePesid What should I tell my health care provider before I take this medicine? They need to know if you have any of these conditions:  infection  kidney disease  liver disease  low blood counts, like low  white cell, platelet, or red cell counts  an unusual or allergic reaction to etoposide, other medicines, foods, dyes, or preservatives  pregnant or trying to get pregnant  breast-feeding How should I use this medicine? This medicine is for infusion into a vein. It is administered in a hospital or clinic by a specially trained health care professional. Talk to your pediatrician regarding the use of this medicine in children. Special care may be needed. Overdosage: If you think you have taken too much of this medicine contact a poison control center or emergency room at once. NOTE: This medicine is only for you. Do not share this medicine with others. What if I miss a dose? It is important not to miss your dose. Call your doctor or health care professional if you are unable to keep an appointment. What may interact with this medicine? This medicine may interact with the following medications:  warfarin This list may not describe all possible interactions. Give your health care provider a list of all the medicines, herbs, non-prescription drugs, or dietary supplements you use. Also tell them if you smoke, drink alcohol, or use illegal drugs. Some items may interact with your medicine. What should I watch for while using this medicine? Visit your doctor for checks on your progress. This drug may make you feel generally unwell. This is not uncommon, as chemotherapy can affect healthy cells as well as cancer cells. Report any side effects. Continue your course of treatment even though you feel ill unless your doctor tells you to stop. In some cases, you may be given additional medicines to help with side effects. Follow all directions for their use. Call your doctor or health care professional for advice if you get a fever, chills or sore throat, or other symptoms of a cold or flu. Do not treat yourself. This drug decreases your body's ability to fight infections. Try to avoid being around people who  are sick. This medicine may increase your risk to bruise or bleed. Call your doctor or health care professional if you notice any unusual bleeding. Talk to your doctor about your risk of cancer. You may be more at risk for certain types of cancers if you take this medicine. Do not become pregnant while taking this medicine or for at least 6 months after stopping it. Women should inform their doctor if they wish to become pregnant or think they might be pregnant. Women of child-bearing potential will need to have a negative pregnancy test before starting this medicine. There is a potential for serious side effects to an unborn child. Talk to your health care  professional or pharmacist for more information. Do not breast-feed an infant while taking this medicine. Men must use a latex condom during sexual contact with a woman while taking this medicine and for at least 4 months after stopping it. A latex condom is needed even if you have had a vasectomy. Contact your doctor right away if your partner becomes pregnant. Do not donate sperm while taking this medicine and for at least 4 months after you stop taking this medicine. Men should inform their doctors if they wish to father a child. This medicine may lower sperm counts. What side effects may I notice from receiving this medicine? Side effects that you should report to your doctor or health care professional as soon as possible:  allergic reactions like skin rash, itching or hives, swelling of the face, lips, or tongue  low blood counts - this medicine may decrease the number of white blood cells, red blood cells, and platelets. You may be at increased risk for infections and bleeding  nausea, vomiting  redness, blistering, peeling or loosening of the skin, including inside the mouth  signs and symptoms of infection like fever; chills; cough; sore throat; pain or trouble passing urine  signs and symptoms of low red blood cells or anemia such as  unusually weak or tired; feeling faint or lightheaded; falls; breathing problems  unusual bruising or bleeding Side effects that usually do not require medical attention (report to your doctor or health care professional if they continue or are bothersome):  changes in taste  diarrhea  hair loss  loss of appetite  mouth sores This list may not describe all possible side effects. Call your doctor for medical advice about side effects. You may report side effects to FDA at 1-800-FDA-1088. Where should I keep my medicine? This drug is given in a hospital or clinic and will not be stored at home. NOTE: This sheet is a summary. It may not cover all possible information. If you have questions about this medicine, talk to your doctor, pharmacist, or health care provider.  2020 Elsevier/Gold Standard (2018-11-12 16:57:15)

## 2020-02-11 ENCOUNTER — Inpatient Hospital Stay: Payer: Medicare Other

## 2020-02-11 ENCOUNTER — Other Ambulatory Visit: Payer: Self-pay

## 2020-02-11 VITALS — BP 122/56 | HR 75 | Temp 98.3°F | Resp 17

## 2020-02-11 DIAGNOSIS — C7951 Secondary malignant neoplasm of bone: Secondary | ICD-10-CM | POA: Diagnosis not present

## 2020-02-11 DIAGNOSIS — C3491 Malignant neoplasm of unspecified part of right bronchus or lung: Secondary | ICD-10-CM

## 2020-02-11 DIAGNOSIS — G893 Neoplasm related pain (acute) (chronic): Secondary | ICD-10-CM | POA: Diagnosis not present

## 2020-02-11 DIAGNOSIS — C3411 Malignant neoplasm of upper lobe, right bronchus or lung: Secondary | ICD-10-CM | POA: Diagnosis not present

## 2020-02-11 DIAGNOSIS — E119 Type 2 diabetes mellitus without complications: Secondary | ICD-10-CM | POA: Diagnosis not present

## 2020-02-11 DIAGNOSIS — E039 Hypothyroidism, unspecified: Secondary | ICD-10-CM | POA: Diagnosis not present

## 2020-02-11 DIAGNOSIS — Z5189 Encounter for other specified aftercare: Secondary | ICD-10-CM | POA: Diagnosis not present

## 2020-02-11 DIAGNOSIS — I480 Paroxysmal atrial fibrillation: Secondary | ICD-10-CM | POA: Diagnosis not present

## 2020-02-11 DIAGNOSIS — Z5111 Encounter for antineoplastic chemotherapy: Secondary | ICD-10-CM | POA: Diagnosis not present

## 2020-02-11 DIAGNOSIS — I10 Essential (primary) hypertension: Secondary | ICD-10-CM | POA: Diagnosis not present

## 2020-02-11 DIAGNOSIS — Z5112 Encounter for antineoplastic immunotherapy: Secondary | ICD-10-CM | POA: Diagnosis not present

## 2020-02-11 MED ORDER — SODIUM CHLORIDE 0.9 % IV SOLN
Freq: Once | INTRAVENOUS | Status: AC
  Filled 2020-02-11: qty 250

## 2020-02-11 MED ORDER — SODIUM CHLORIDE 0.9 % IV SOLN
100.0000 mg/m2 | Freq: Once | INTRAVENOUS | Status: AC
  Administered 2020-02-11: 150 mg via INTRAVENOUS
  Filled 2020-02-11: qty 7.5

## 2020-02-11 MED ORDER — SODIUM CHLORIDE 0.9 % IV SOLN
10.0000 mg | Freq: Once | INTRAVENOUS | Status: AC
  Administered 2020-02-11: 10 mg via INTRAVENOUS
  Filled 2020-02-11: qty 10

## 2020-02-11 NOTE — Progress Notes (Signed)
Per Dr. Julien [name], [name]: okay for patient to go home with PIV access due to being a difficult stick. Patient will return for treatment tomorrow, d/c access after treatment.  PIV wrapped with [place]an tapped down, dated and signed.

## 2020-02-11 NOTE — Patient Instructions (Signed)
[facility] Discharge Instructions for Patients Receiving Chemotherapy  Today you received the following chemotherapy agents: Etoposide (VEPESID).  To help prevent nausea and vomiting after your treatment, we encourage you to take your nausea medication as prescribed.   If you develop nausea and vomiting that is not controlled by your nausea medication, call the clinic.   BELOW ARE SYMPTOMS THAT SHOULD BE REPORTED IMMEDIATELY:  *FEVER GREATER THAN 100.5 F  *CHILLS WITH OR WITHOUT FEVER  NAUSEA AND VOMITING THAT IS NOT CONTROLLED WITH YOUR NAUSEA MEDICATION  *UNUSUAL SHORTNESS OF BREATH  *UNUSUAL BRUISING OR BLEEDING  TENDERNESS IN MOUTH AND THROAT WITH OR WITHOUT PRESENCE OF ULCERS  *URINARY PROBLEMS  *BOWEL PROBLEMS  UNUSUAL RASH Items with * indicate a potential emergency and should be followed up as soon as possible.  Feel free to call the clinic should you have any questions or concerns. The clinic phone number is [phone].  Please show the CHEMO ALERT CARD at check-in to the Emergency Department and triage nurse.   

## 2020-02-11 NOTE — Progress Notes (Signed)
Remote pacemaker transmission.   

## 2020-02-12 ENCOUNTER — Inpatient Hospital Stay: Payer: Medicare Other

## 2020-02-12 ENCOUNTER — Other Ambulatory Visit: Payer: Self-pay

## 2020-02-12 ENCOUNTER — Ambulatory Visit: Payer: Medicare Other

## 2020-02-12 VITALS — BP 139/89 | HR 73 | Temp 98.7°F | Resp 17

## 2020-02-12 DIAGNOSIS — I10 Essential (primary) hypertension: Secondary | ICD-10-CM | POA: Diagnosis not present

## 2020-02-12 DIAGNOSIS — C3411 Malignant neoplasm of upper lobe, right bronchus or lung: Secondary | ICD-10-CM | POA: Diagnosis not present

## 2020-02-12 DIAGNOSIS — Z5112 Encounter for antineoplastic immunotherapy: Secondary | ICD-10-CM | POA: Diagnosis not present

## 2020-02-12 DIAGNOSIS — C3491 Malignant neoplasm of unspecified part of right bronchus or lung: Secondary | ICD-10-CM

## 2020-02-12 DIAGNOSIS — I480 Paroxysmal atrial fibrillation: Secondary | ICD-10-CM | POA: Diagnosis not present

## 2020-02-12 DIAGNOSIS — E119 Type 2 diabetes mellitus without complications: Secondary | ICD-10-CM | POA: Diagnosis not present

## 2020-02-12 DIAGNOSIS — G893 Neoplasm related pain (acute) (chronic): Secondary | ICD-10-CM | POA: Diagnosis not present

## 2020-02-12 DIAGNOSIS — Z5189 Encounter for other specified aftercare: Secondary | ICD-10-CM | POA: Diagnosis not present

## 2020-02-12 DIAGNOSIS — E039 Hypothyroidism, unspecified: Secondary | ICD-10-CM | POA: Diagnosis not present

## 2020-02-12 DIAGNOSIS — C7951 Secondary malignant neoplasm of bone: Secondary | ICD-10-CM | POA: Diagnosis not present

## 2020-02-12 DIAGNOSIS — Z5111 Encounter for antineoplastic chemotherapy: Secondary | ICD-10-CM | POA: Diagnosis not present

## 2020-02-12 MED ORDER — SODIUM CHLORIDE 0.9 % IV SOLN
10.0000 mg | Freq: Once | INTRAVENOUS | Status: AC
  Administered 2020-02-12: 10 mg via INTRAVENOUS
  Filled 2020-02-12: qty 10

## 2020-02-12 MED ORDER — SODIUM CHLORIDE 0.9 % IV SOLN
100.0000 mg/m2 | Freq: Once | INTRAVENOUS | Status: AC
  Administered 2020-02-12: 150 mg via INTRAVENOUS
  Filled 2020-02-12: qty 7.5

## 2020-02-12 MED ORDER — SODIUM CHLORIDE 0.9 % IV SOLN
Freq: Once | INTRAVENOUS | Status: AC
  Filled 2020-02-12: qty 250

## 2020-02-12 NOTE — Patient Instructions (Signed)
[facility] Discharge Instructions for Patients Receiving Chemotherapy  Today you received the following chemotherapy agents: Etoposide  To help prevent nausea and vomiting after your treatment, we encourage you to take your nausea medication as directed.   If you develop nausea and vomiting that is not controlled by your nausea medication, call the clinic.   BELOW ARE SYMPTOMS THAT SHOULD BE REPORTED IMMEDIATELY:  *FEVER GREATER THAN 100.5 F  *CHILLS WITH OR WITHOUT FEVER  NAUSEA AND VOMITING THAT IS NOT CONTROLLED WITH YOUR NAUSEA MEDICATION  *UNUSUAL SHORTNESS OF BREATH  *UNUSUAL BRUISING OR BLEEDING  TENDERNESS IN MOUTH AND THROAT WITH OR WITHOUT PRESENCE OF ULCERS  *URINARY PROBLEMS  *BOWEL PROBLEMS  UNUSUAL RASH Items with * indicate a potential emergency and should be followed up as soon as possible.  Feel free to call the clinic should you have any questions or concerns. The clinic phone number is [phone].  Please show the [facility] at check-in to the Emergency Department and triage nurse.

## 2020-02-13 ENCOUNTER — Inpatient Hospital Stay: Payer: Medicare Other

## 2020-02-15 ENCOUNTER — Inpatient Hospital Stay: Payer: Medicare Other

## 2020-02-15 ENCOUNTER — Other Ambulatory Visit: Payer: Self-pay | Admitting: Internal Medicine

## 2020-02-15 ENCOUNTER — Inpatient Hospital Stay (HOSPITAL_BASED_OUTPATIENT_CLINIC_OR_DEPARTMENT_OTHER): Payer: Medicare Other | Admitting: Internal Medicine

## 2020-02-15 ENCOUNTER — Encounter: Payer: Self-pay | Admitting: Internal Medicine

## 2020-02-15 ENCOUNTER — Other Ambulatory Visit: Payer: Self-pay

## 2020-02-15 VITALS — BP 101/55 | HR 62 | Temp 98.3°F | Resp 17 | Ht 62.0 in | Wt 114.1 lb

## 2020-02-15 DIAGNOSIS — Z5189 Encounter for other specified aftercare: Secondary | ICD-10-CM | POA: Diagnosis not present

## 2020-02-15 DIAGNOSIS — E119 Type 2 diabetes mellitus without complications: Secondary | ICD-10-CM | POA: Diagnosis not present

## 2020-02-15 DIAGNOSIS — Z5112 Encounter for antineoplastic immunotherapy: Secondary | ICD-10-CM

## 2020-02-15 DIAGNOSIS — C3491 Malignant neoplasm of unspecified part of right bronchus or lung: Secondary | ICD-10-CM | POA: Diagnosis not present

## 2020-02-15 DIAGNOSIS — G893 Neoplasm related pain (acute) (chronic): Secondary | ICD-10-CM | POA: Diagnosis not present

## 2020-02-15 DIAGNOSIS — E039 Hypothyroidism, unspecified: Secondary | ICD-10-CM | POA: Diagnosis not present

## 2020-02-15 DIAGNOSIS — Z5111 Encounter for antineoplastic chemotherapy: Secondary | ICD-10-CM | POA: Diagnosis not present

## 2020-02-15 DIAGNOSIS — C7951 Secondary malignant neoplasm of bone: Secondary | ICD-10-CM | POA: Diagnosis not present

## 2020-02-15 DIAGNOSIS — I480 Paroxysmal atrial fibrillation: Secondary | ICD-10-CM | POA: Diagnosis not present

## 2020-02-15 DIAGNOSIS — I10 Essential (primary) hypertension: Secondary | ICD-10-CM | POA: Diagnosis not present

## 2020-02-15 DIAGNOSIS — C3411 Malignant neoplasm of upper lobe, right bronchus or lung: Secondary | ICD-10-CM | POA: Diagnosis not present

## 2020-02-15 LAB — CBC WITH DIFFERENTIAL (CANCER CENTER ONLY)
Abs Immature Granulocytes: 0.01 10*3/uL (ref 0.00–0.07)
Basophils Absolute: 0 10*3/uL (ref 0.0–0.1)
Basophils Relative: 0 %
Eosinophils Absolute: 0.1 10*3/uL (ref 0.0–0.5)
Eosinophils Relative: 3 %
HCT: 32.6 % — ABNORMAL LOW (ref 36.0–46.0)
Hemoglobin: 10.1 g/dL — ABNORMAL LOW (ref 12.0–15.0)
Immature Granulocytes: 0 %
Lymphocytes Relative: 54 %
Lymphs Abs: 1.5 10*3/uL (ref 0.7–4.0)
MCH: 27.6 pg (ref 26.0–34.0)
MCHC: 31 g/dL (ref 30.0–36.0)
MCV: 89.1 fL (ref 80.0–100.0)
Monocytes Absolute: 0.1 10*3/uL (ref 0.1–1.0)
Monocytes Relative: 3 %
Neutro Abs: 1.1 10*3/uL — ABNORMAL LOW (ref 1.7–7.7)
Neutrophils Relative %: 40 %
Platelet Count: 198 10*3/uL (ref 150–400)
RBC: 3.66 MIL/uL — ABNORMAL LOW (ref 3.87–5.11)
RDW: 14.7 % (ref 11.5–15.5)
WBC Count: 2.8 10*3/uL — ABNORMAL LOW (ref 4.0–10.5)
nRBC: 0 % (ref 0.0–0.2)

## 2020-02-15 LAB — CMP (CANCER CENTER ONLY)
ALT: 21 U/L (ref 0–44)
AST: 13 U/L — ABNORMAL LOW (ref 15–41)
Albumin: 2.8 g/dL — ABNORMAL LOW (ref 3.5–5.0)
Alkaline Phosphatase: 57 U/L (ref 38–126)
Anion gap: 7 (ref 5–15)
BUN: 23 mg/dL (ref 8–23)
CO2: 30 mmol/L (ref 22–32)
Calcium: 9.1 mg/dL (ref 8.9–10.3)
Chloride: 101 mmol/L (ref 98–111)
Creatinine: 0.82 mg/dL (ref 0.44–1.00)
GFR, Est AFR Am: >60 mL/min (ref >60)
GFR, Estimated: >60 mL/min (ref >60)
Glucose, Bld: 122 mg/dL — ABNORMAL HIGH (ref 70–99)
Potassium: 4.4 mmol/L (ref 3.5–5.1)
Sodium: 138 mmol/L (ref 135–145)
Total Bilirubin: 0.2 mg/dL — ABNORMAL LOW (ref 0.3–1.2)
Total Protein: 6.3 g/dL — ABNORMAL LOW (ref 6.5–8.1)

## 2020-02-15 MED ORDER — PEGFILGRASTIM-CBQV 6 MG/0.6ML ~~LOC~~ SOSY
PREFILLED_SYRINGE | SUBCUTANEOUS | Status: AC
  Filled 2020-02-15: qty 0.6

## 2020-02-15 MED ORDER — PEGFILGRASTIM-CBQV 6 MG/0.6ML ~~LOC~~ SOSY
6.0000 mg | PREFILLED_SYRINGE | Freq: Once | SUBCUTANEOUS | Status: AC
  Administered 2020-02-15: 6 mg via SUBCUTANEOUS

## 2020-02-15 NOTE — Progress Notes (Signed)
[place] [phone]   [phone]  OFFICE PROGRESS NOTE  [name], Robin G, MD [address] [place] [address]  DIAGNOSIS: Extensive stage (T2 a, N2, M1 C) small cell lung cancer presented with central right upper lobe lung mass in addition to left lower lobe pulmonary metastasis and bilateral hilar, subcarinal and bilateral paratracheal and left prevascular lymphadenopathy in addition to bone metastasis in the left iliac wing diagnosed in April 2021.  PRIOR THERAPY: None  CURRENT THERAPY: Systemic chemotherapy with carboplatin for AUC of 5 on day 1, etoposide 100 mg/M2 on days 1, 2 and 3 with Neulasta support in addition to Imfinzi 1500 mg IV every 3 weeks during chemotherapy followed by maintenance Imfinzi 1500 mg IV every 4 weeks after cycle #4.  Status post 1 cycle.  INTERVAL HISTORY: [name] 85 y.o. female returns to the clinic today for follow-up visit accompanied by her daughter.  The patient tolerated the first week of her treatment fairly well with no concerning adverse effect except for one mild episode of nausea resolved quickly.  She denied having any chest pain, shortness of breath, cough or hemoptysis.  She is eating much better with less dysphagia.  She denied having any nausea, vomiting, diarrhea or constipation.  She denied having any headache or visual changes.  She is here today for evaluation with repeat blood work.   MEDICAL HISTORY: Past Medical History:  Diagnosis Date  . CAD (coronary artery disease), native coronary artery    PTCA of distal right coronary artery 1997 Cypher stents to circumflex 2005 Cardiac cath in 2011 with patent stent to circumflex and moderate disease elsewhere treated medically   . Cancer ([place])   . Cardiac pacemaker in situ 12/24/2015   Original implant reportedly in 1991 for tachybradycardia syndrome, generator change in 1999, 2004 and evidently again in 2016 in [place]   . COPD  (chronic obstructive pulmonary disease) ([place])   . Diabetes mellitus without complication ([place])   . Hypertension   . Hypothyroidism 03/23/2010  . Pacemaker   . Paroxysmal atrial fibrillation ([place]) 03/23/2010   CHA2DS2VASC score 5     ALLERGIES:  has No Known Allergies.  MEDICATIONS:  Current Outpatient Medications  Medication Sig Dispense Refill  . acetaminophen (TYLENOL) 500 MG tablet Take 500 mg by mouth every 6 (six) hours as needed for headache.    . albuterol (VENTOLIN HFA) 108 (90 Base) MCG/ACT inhaler TAKE 2 PUFFS BY MOUTH EVERY 6 HOURS AS NEEDED FOR WHEEZE OR SHORTNESS OF BREATH*NOT COVERED* (Patient taking differently: Inhale 2 puffs into the lungs every 6 (six) hours as needed for wheezing or shortness of breath. ) 8.5 [name] 1  . atorvastatin (LIPITOR) 10 MG tablet TAKE 1 TABLET BY MOUTH EVERY DAY 90 tablet 3  . carvedilol (COREG) 12.5 MG tablet TAKE 1 TABLET (12.5 MG TOTAL) BY MOUTH 2 (TWO) TIMES DAILY WITH A MEAL. 180 tablet 2  . [employer] NICOTINE TRANSDERMAL SYS 14 MG/24HR patch PLACE 1 PATCH (14 MG TOTAL) ONTO THE SKIN DAILY. 28 patch 0  . diclofenac sodium (VOLTAREN) 1 % GEL Apply 4 [name] topically 4 (four) times daily. (Patient taking differently: Apply 4 [name] topically 4 (four) times daily as needed (pain). ) 4 Tube 3  . dicyclomine (BENTYL) 10 MG capsule TAKE 1 CAPSULE (10 MG TOTAL) BY MOUTH 4 (FOUR) TIMES DAILY - BEFORE MEALS AND AT BEDTIME. 90 capsule 0  . fenofibrate (TRICOR) 48 MG tablet TAKE 1  TABLET BY MOUTH EVERY DAY (Patient taking differently: Take 48 mg by mouth daily after lunch. ) 90 tablet 3  . glucose blood (ONETOUCH VERIO) test strip Use to test 3-4 times daily. 300 strip 4  . levothyroxine (SYNTHROID) 112 MCG tablet TAKE 1 TABLET (112 MCG TOTAL) BY MOUTH DAILY BEFORE BREAKFAST. 90 tablet 2  . linagliptin (TRADJENTA) 5 MG TABS tablet TAKE 1 TABLET (5 MG TOTAL) DAILY BY MOUTH. (Patient taking differently: Take 5 mg by mouth daily after breakfast. ) 90 tablet 1  . losartan  (COZAAR) 50 MG tablet Take 2 tablets by mouth daily. (Patient taking differently: Take 100 mg by mouth daily. ) 180 tablet 1  . nitroGLYCERIN (NITROSTAT) 0.4 MG SL tablet Place 1 tablet (0.4 mg total) under the tongue every 5 (five) minutes x 3 doses as needed for chest pain (Max 3 doses within 15 min. Call 911). 10 tablet 0  . oxyCODONE-acetaminophen (PERCOCET/ROXICET) 5-325 MG tablet Take 1 tablet by mouth every 8 (eight) hours as needed for severe pain. 20 tablet 0  . pantoprazole (PROTONIX) 40 MG tablet TAKE 1 TABLET BY MOUTH EVERY DAY 90 tablet 1  . prochlorperazine (COMPAZINE) 10 MG tablet Take 1 tablet (10 mg total) by mouth every 6 (six) hours as needed for nausea or vomiting. 30 tablet 1  . XARELTO 15 MG TABS tablet TAKE 1 TABLET BY MOUTH DAILY WITH SUPPER. (Patient taking differently: Take 15 mg by mouth daily with supper. ) 30 tablet 3  . potassium chloride SA (KLOR-CON) 20 MEQ tablet 1 tab bid x 2 then 1 tab daily (Patient not taking: Reported on 02/01/2020) 30 tablet 3   No current facility-administered medications for this visit.    SURGICAL HISTORY:  Past Surgical History:  Procedure Laterality Date  . BIOPSY  01/08/2020   Procedure: BIOPSY;  Surgeon: Garner Nash, DO;  Location: [facility];  Service: Pulmonary;;  . BRONCHIAL BRUSHINGS  01/08/2020   Procedure: BRONCHIAL BRUSHINGS;  Surgeon: Garner Nash, DO;  Location: [facility];  Service: Pulmonary;;  . BRONCHIAL WASHINGS  01/08/2020   Procedure: BRONCHIAL WASHINGS;  Surgeon: Garner Nash, DO;  Location: [facility];  Service: Pulmonary;;  . CARDIAC CATHETERIZATION N/A 12/26/2015   Procedure: Left Heart Cath and Coronary Angiography;  Surgeon: Peter M [name], MD;  Location: [facility];  Service: Cardiovascular;  Laterality: N/A;  . CARDIOVERSION  05/2015  . CHOLECYSTECTOMY  2012  . CORONARY ANGIOPLASTY WITH STENT PLACEMENT  1990  . ENDOBRONCHIAL ULTRASOUND  01/08/2020   Procedure: ENDOBRONCHIAL ULTRASOUND;   Surgeon: Garner Nash, DO;  Location: [facility];  Service: Pulmonary;;  . FINE NEEDLE ASPIRATION BIOPSY  01/08/2020   Procedure: FINE NEEDLE ASPIRATION BIOPSY;  Surgeon: Garner Nash, DO;  Location: [facility];  Service: Pulmonary;;  . HERNIA REPAIR    . PACEMAKER GENERATOR CHANGE  [id number], 2016  . PACEMAKER INSERTION  1991  . VIDEO BRONCHOSCOPY WITH ENDOBRONCHIAL ULTRASOUND N/A 01/08/2020   Procedure: VIDEO BRONCHOSCOPY;  Surgeon: Garner Nash, DO;  Location: [facility];  Service: Pulmonary;  Laterality: N/A;    REVIEW OF SYSTEMS:  A comprehensive review of systems was negative except for: Constitutional: positive for fatigue   PHYSICAL EXAMINATION: General appearance: alert, cooperative, appears stated age, fatigued and no distress Head: Normocephalic, without obvious abnormality, atraumatic Neck: no adenopathy, no JVD, supple, symmetrical, trachea midline and thyroid not enlarged, symmetric, no tenderness/mass/nodules Lymph nodes: Cervical, supraclavicular, and axillary nodes normal. Resp: clear to auscultation bilaterally  Back: symmetric, no curvature. ROM normal. No CVA tenderness. Cardio: regular rate and rhythm, S1, S2 normal, no murmur, click, rub or gallop GI: soft, non-tender; bowel sounds normal; no masses,  no organomegaly Extremities: extremities normal, atraumatic, no cyanosis or edema  ECOG PERFORMANCE STATUS: 1 - Symptomatic but completely ambulatory  Blood pressure (!) 101/55, pulse 62, temperature 98.3 F (36.8 C), temperature source Temporal, resp. rate 17, height 5\' 2"  (1.575 m), weight 114 lb 1.6 oz (51.8 kg), SpO2 100 %.  LABORATORY DATA: Lab Results  Component Value Date   WBC 2.8 (L) 02/15/2020   HGB 10.1 (L) 02/15/2020   HCT 32.6 (L) 02/15/2020   MCV 89.1 02/15/2020   PLT 198 02/15/2020      Chemistry      Component Value Date/Time   NA 138 02/09/2020 1058   K 4.1 02/09/2020 1058   CL 105 02/09/2020 1058   CO2 26 02/09/2020 1058    BUN 17 02/09/2020 1058   CREATININE 0.82 02/09/2020 1058      Component Value Date/Time   CALCIUM 9.1 02/09/2020 1058   ALKPHOS 54 02/09/2020 1058   AST 14 (L) 02/09/2020 1058   ALT 11 02/09/2020 1058   BILITOT 0.2 (L) 02/09/2020 1058       RADIOGRAPHIC STUDIES: CUP PACEART REMOTE DEVICE CHECK  Result Date: 02/10/2020 Scheduled remote reviewed. Normal device function.  8% AF burden. Known PAF, takes Xarelto. Next remote 91 days. Felisa Bonier, RN, MSNMay [phone], 2021 16:15 - Yellow Alert - Atrial Arrhythmia Burden of at least 6.0 hours in a 24 hour period.   ASSESSMENT AND PLAN: This is a very pleasant 85 years old African-American female recently diagnosed with extensive stage small cell lung cancer presented with central right upper lobe lung mass in addition to left lower lobe lung nodule and mediastinal lymphadenopathy and metastatic bone lesion in the left iliac wing diagnosed in April 2021. The patient started her treatment today with carboplatin for AUC of 5 on day 1, etoposide 100 mg/M2 on days 1, 2 and 3 with Neulasta support in addition to Imfinzi 1500 mg every 3 weeks during the chemotherapy course followed by 1500 mg every 4 weeks as maintenance if no evidence for disease progression.  Status post 1 cycle. Her insurance declined the treatment with [place]. She tolerated the first week of her treatment well with no concerning adverse effects.  She notes some improvement in her dysphagia. I recommended for the patient to continue her current treatment and she will start cycle #2 in 2 weeks. She will receive her Neulasta injection today. The patient was advised to call immediately if she has any concerning symptoms in the interval. The patient voices understanding of current disease status and treatment options and is in agreement with the current care plan.  All questions were answered. The patient knows to call the clinic with any problems, questions or concerns. We can certainly see  the patient much sooner if necessary.  Disclaimer: This note was dictated with voice recognition software. Similar sounding words can inadvertently be transcribed and may not be corrected upon review.

## 2020-02-15 NOTE — Patient Instructions (Signed)

## 2020-02-16 ENCOUNTER — Other Ambulatory Visit: Payer: Self-pay | Admitting: Radiology

## 2020-02-18 ENCOUNTER — Telehealth: Payer: Self-pay | Admitting: Interventional Cardiology

## 2020-02-18 ENCOUNTER — Other Ambulatory Visit: Payer: Self-pay | Admitting: Internal Medicine

## 2020-02-18 ENCOUNTER — Encounter (HOSPITAL_COMMUNITY): Payer: Self-pay

## 2020-02-18 ENCOUNTER — Ambulatory Visit (HOSPITAL_COMMUNITY)
Admission: RE | Admit: 2020-02-18 | Discharge: 2020-02-18 | Disposition: A | Discharge status: Home / Self Care | Payer: Medicare Other | Source: Ambulatory Visit | Attending: Internal Medicine | Admitting: Internal Medicine

## 2020-02-18 ENCOUNTER — Other Ambulatory Visit: Payer: Self-pay

## 2020-02-18 ENCOUNTER — Telehealth: Payer: Self-pay | Admitting: Family Medicine

## 2020-02-18 DIAGNOSIS — Z79890 Hormone replacement therapy (postmenopausal): Secondary | ICD-10-CM | POA: Insufficient documentation

## 2020-02-18 DIAGNOSIS — I878 Other specified disorders of veins: Secondary | ICD-10-CM

## 2020-02-18 DIAGNOSIS — Z950 Presence of cardiac pacemaker: Secondary | ICD-10-CM | POA: Diagnosis not present

## 2020-02-18 DIAGNOSIS — I2510 Atherosclerotic heart disease of native coronary artery without angina pectoris: Secondary | ICD-10-CM | POA: Diagnosis not present

## 2020-02-18 DIAGNOSIS — C3411 Malignant neoplasm of upper lobe, right bronchus or lung: Secondary | ICD-10-CM | POA: Insufficient documentation

## 2020-02-18 DIAGNOSIS — I480 Paroxysmal atrial fibrillation: Secondary | ICD-10-CM | POA: Insufficient documentation

## 2020-02-18 DIAGNOSIS — Z452 Encounter for adjustment and management of vascular access device: Secondary | ICD-10-CM | POA: Diagnosis not present

## 2020-02-18 DIAGNOSIS — J449 Chronic obstructive pulmonary disease, unspecified: Secondary | ICD-10-CM | POA: Diagnosis not present

## 2020-02-18 DIAGNOSIS — Z87891 Personal history of nicotine dependence: Secondary | ICD-10-CM | POA: Insufficient documentation

## 2020-02-18 DIAGNOSIS — E039 Hypothyroidism, unspecified: Secondary | ICD-10-CM | POA: Diagnosis not present

## 2020-02-18 DIAGNOSIS — Z955 Presence of coronary angioplasty implant and graft: Secondary | ICD-10-CM | POA: Insufficient documentation

## 2020-02-18 DIAGNOSIS — E119 Type 2 diabetes mellitus without complications: Secondary | ICD-10-CM | POA: Insufficient documentation

## 2020-02-18 DIAGNOSIS — Z7901 Long term (current) use of anticoagulants: Secondary | ICD-10-CM | POA: Insufficient documentation

## 2020-02-18 DIAGNOSIS — Z79899 Other long term (current) drug therapy: Secondary | ICD-10-CM | POA: Insufficient documentation

## 2020-02-18 DIAGNOSIS — Z7984 Long term (current) use of oral hypoglycemic drugs: Secondary | ICD-10-CM | POA: Diagnosis not present

## 2020-02-18 DIAGNOSIS — I10 Essential (primary) hypertension: Secondary | ICD-10-CM | POA: Insufficient documentation

## 2020-02-18 HISTORY — PX: IR IMAGING GUIDED PORT INSERTION: IMG5740

## 2020-02-18 LAB — CBC WITH DIFFERENTIAL/PLATELET
Abs Immature Granulocytes: 0.78 10*3/uL — ABNORMAL HIGH (ref 0.00–0.07)
Basophils Absolute: 0.1 10*3/uL (ref 0.0–0.1)
Basophils Relative: 1 %
Eosinophils Absolute: 0.1 10*3/uL (ref 0.0–0.5)
Eosinophils Relative: 1 %
HCT: 39.2 % (ref 36.0–46.0)
Hemoglobin: 12.2 g/dL (ref 12.0–15.0)
Immature Granulocytes: 8 %
Lymphocytes Relative: 19 %
Lymphs Abs: 1.8 10*3/uL (ref 0.7–4.0)
MCH: 28.5 pg (ref 26.0–34.0)
MCHC: 31.1 g/dL (ref 30.0–36.0)
MCV: 91.6 fL (ref 80.0–100.0)
Monocytes Absolute: 0.2 10*3/uL (ref 0.1–1.0)
Monocytes Relative: 2 %
Neutro Abs: 6.3 10*3/uL (ref 1.7–7.7)
Neutrophils Relative %: 69 %
Platelets: 163 10*3/uL (ref 150–400)
RBC: 4.28 MIL/uL (ref 3.87–5.11)
RDW: 14.8 % (ref 11.5–15.5)
WBC: 9.3 10*3/uL (ref 4.0–10.5)
nRBC: 0 % (ref 0.0–0.2)

## 2020-02-18 LAB — GLUCOSE, CAPILLARY
Glucose-Capillary: 62 mg/dL — ABNORMAL LOW (ref 70–99)
Glucose-Capillary: 166 mg/dL — ABNORMAL HIGH (ref 70–99)

## 2020-02-18 LAB — PROTIME-INR
INR: 1 (ref 0.8–1.2)
Prothrombin Time: 12.3 seconds (ref 11.4–15.2)

## 2020-02-18 MED ORDER — CEFAZOLIN SODIUM-DEXTROSE 2-4 GM/100ML-% IV SOLN
INTRAVENOUS | Status: AC
  Filled 2020-02-18: qty 100

## 2020-02-18 MED ORDER — LIDOCAINE-EPINEPHRINE (PF) 1 %-1:200000 IJ SOLN
INTRAMUSCULAR | Status: AC | PRN
  Administered 2020-02-18 (×2): 10 mL

## 2020-02-18 MED ORDER — FENTANYL CITRATE (PF) 100 MCG/2ML IJ SOLN
INTRAMUSCULAR | Status: AC
  Filled 2020-02-18: qty 2

## 2020-02-18 MED ORDER — HEPARIN SOD (PORK) LOCK FLUSH 100 UNIT/ML IV SOLN
INTRAVENOUS | Status: AC
  Filled 2020-02-18: qty 5

## 2020-02-18 MED ORDER — HEPARIN SOD (PORK) LOCK FLUSH 100 UNIT/ML IV SOLN
INTRAVENOUS | Status: AC | PRN
  Administered 2020-02-18: 500 [IU] via INTRAVENOUS

## 2020-02-18 MED ORDER — DEXTROSE 50 % IV SOLN
1.0000 | Freq: Once | INTRAVENOUS | Status: AC
  Administered 2020-02-18: 50 mL via INTRAVENOUS

## 2020-02-18 MED ORDER — LORAZEPAM 2 MG/ML IJ SOLN: INTRAMUSCULAR | Status: AC | PRN

## 2020-02-18 MED ORDER — SODIUM CHLORIDE 0.9 % IV SOLN: INTRAVENOUS | Status: DC

## 2020-02-18 MED ORDER — IOHEXOL 300 MG/ML  SOLN
50.0000 mL | Freq: Once | INTRAMUSCULAR | Status: AC | PRN
  Administered 2020-02-18: 8 mL via INTRAVENOUS

## 2020-02-18 MED ORDER — CEFAZOLIN SODIUM-DEXTROSE 2-4 GM/100ML-% IV SOLN
2.0000 g | INTRAVENOUS | Status: AC
  Administered 2020-02-18: 2 g via INTRAVENOUS

## 2020-02-18 MED ORDER — LIDOCAINE-EPINEPHRINE 1 %-1:100000 IJ SOLN
INTRAMUSCULAR | Status: AC
  Filled 2020-02-18: qty 1

## 2020-02-18 MED ORDER — MIDAZOLAM HCL 2 MG/2ML IJ SOLN
INTRAMUSCULAR | Status: AC | PRN
  Administered 2020-02-18 (×2): 0.5 mg via INTRAVENOUS
  Administered 2020-02-18: 1 mg via INTRAVENOUS

## 2020-02-18 MED ORDER — FENTANYL CITRATE (PF) 100 MCG/2ML IJ SOLN
INTRAMUSCULAR | Status: AC | PRN
  Administered 2020-02-18 (×2): 25 ug via INTRAVENOUS
  Administered 2020-02-18: 50 ug via INTRAVENOUS

## 2020-02-18 MED ORDER — MIDAZOLAM HCL 2 MG/2ML IJ SOLN
INTRAMUSCULAR | Status: AC
  Filled 2020-02-18: qty 2

## 2020-02-18 MED ORDER — DEXTROSE 50 % IV SOLN
INTRAVENOUS | Status: AC
  Filled 2020-02-18: qty 50

## 2020-02-18 NOTE — Procedures (Signed)
Interventional Radiology Procedure Note  Procedure: Placement of a LEFT IJ approach single lumen PowerPort.  Tip is positioned in the upper RA and catheter is ready for immediate use.  Complications: No immediate Recommendations:  - Ok to shower tomorrow - Do not submerge for 7 days - Routine line care   Signed,  Hal Norrington K. Dalasia Predmore, MD   

## 2020-02-18 NOTE — Telephone Encounter (Signed)
New Message    Pts daughter is calling and says the pt is suppose to start back on Xarelto tomorrow since having her procedure  Pts daughter is calling to make sure this is okay    Please call back

## 2020-02-18 NOTE — Discharge Instructions (Signed)
Please call Interventional Radiology clinic [phone] with any urgent needs (questions or concerns).  You may remove your dressing and shower tomorrow.  Keep site clean and dry.  Apply clean bandaid as necessary.  DO NOT use EMLA cream for 2 weeks after port placement as this cream will remove surgical glue on your incision.  After your treatments are complete, your provider should have monthly port flushes scheduled for you.   Moderate Conscious Sedation, Adult, Care After These instructions provide you with information about caring for yourself after your procedure. Your health care provider may also give you more specific instructions. Your treatment has been planned according to current medical practices, but problems sometimes occur. Call your health care provider if you have any problems or questions after your procedure. What can I expect after the procedure? After your procedure, it is common:  To feel sleepy for several hours.  To feel clumsy and have poor balance for several hours.  To have poor judgment for several hours.  To vomit if you eat too soon. Follow these instructions at home: For at least 24 hours after the procedure:  Do not: ? Participate in activities where you could fall or become injured. ? Drive. ? Use heavy machinery. ? Drink alcohol. ? Take sleeping pills or medicines that cause drowsiness. ? Make important decisions or sign legal documents. ? Take care of children on your own.  Rest. Eating and drinking  Follow the diet recommended by your health care provider.  If you vomit: ? Drink water, juice, or soup when you can drink without vomiting. ? Make sure you have little or no nausea before eating solid foods. General instructions  Have a responsible adult stay with you until you are awake and alert.  Take over-the-counter and prescription medicines only as told by your health care provider.  If you smoke, do not smoke without  supervision.  Keep all follow-up visits as told by your health care provider. This is important. Contact a health care provider if:  You keep feeling nauseous or you keep vomiting.  You feel light-headed.  You develop a rash.  You have a fever. Get help right away if:  You have trouble breathing. This information is not intended to replace advice given to you by your health care provider. Make sure you discuss any questions you have with your health care provider. Document Revised: 08/30/2017 Document Reviewed: 01/07/2016 Elsevier Patient Education  Roff Insertion, Care After This sheet gives you information about how to care for yourself after your procedure. Your health care provider may also give you more specific instructions. If you have problems or questions, contact your health care provider. What can I expect after the procedure? After the procedure, it is common to have:  Discomfort at the port insertion site.  Bruising on the skin over the port. This should improve over 3-4 days. Follow these instructions at home: [facility] care  After your port is placed, you will get a manufacturer's information card. The card has information about your port. Keep this card with you at all times.  Take care of the port as told by your health care provider. Ask your health care provider if you or a family member can get training for taking care of the port at home. A home health care nurse may also take care of the port.  Make sure to remember what type of port you have. Incision care  Follow instructions from your health  care provider about how to take care of your port insertion site. Make sure you: ? Wash your hands with soap and water before and after you change your bandage (dressing). If soap and water are not available, use hand sanitizer. ? Change your dressing as told by your health care provider. ? Leave stitches (sutures), skin glue, or adhesive  strips in place. These skin closures may need to stay in place for 2 weeks or longer. If adhesive strip edges start to loosen and curl up, you may trim the loose edges. Do not remove adhesive strips completely unless your health care provider tells you to do that.  Check your port insertion site every day for signs of infection. Check for: ? Redness, swelling, or pain. ? Fluid or blood. ? Warmth. ? Pus or a bad smell. Activity  Return to your normal activities as told by your health care provider. Ask your health care provider what activities are safe for you.  Do not lift anything that is heavier than 10 lb (4.5 kg), or the limit that you are told, until your health care provider says that it is safe. General instructions  Take over-the-counter and prescription medicines only as told by your health care provider.  Do not take baths, swim, or use a hot tub until your health care provider approves. Ask your health care provider if you may take showers. You may only be allowed to take sponge baths.  Do not drive for 24 hours if you were given a sedative during your procedure.  Wear a medical alert bracelet in case of an emergency. This will tell any health care providers that you have a port.  Keep all follow-up visits as told by your health care provider. This is important. Contact a health care provider if:  You cannot flush your port with saline as directed, or you cannot draw blood from the port.  You have a fever or chills.  You have redness, swelling, or pain around your port insertion site.  You have fluid or blood coming from your port insertion site.  Your port insertion site feels warm to the touch.  You have pus or a bad smell coming from the port insertion site. Get help right away if:  You have chest pain or shortness of breath.  You have bleeding from your port that you cannot control. Summary  Take care of the port as told by your health care provider. Keep the  manufacturer's information card with you at all times.  Change your dressing as told by your health care provider.  Contact a health care provider if you have a fever or chills or if you have redness, swelling, or pain around your port insertion site.  Keep all follow-up visits as told by your health care provider. This information is not intended to replace advice given to you by your health care provider. Make sure you discuss any questions you have with your health care provider. Document Revised: 04/15/2018 Document Reviewed: 04/15/2018 Elsevier Patient Education  [place].

## 2020-02-18 NOTE — H&P (Signed)
Chief Complaint: Patient was seen in consultation today for lung cancer/Port-a-cath placement.  Referring Physician(s): Mohamed,Mohamed  Supervising Physician: Jacqulynn Cadet  Patient Status: [facility] - Out-pt  History of Present Illness: [name] is a 85 y.o. female with a past medical history of hypertension, paroxysmal atrial fibrillation on chronic anticoagulation with Xarelto, CAD, s/p pacemaker, COPD, lung cancer, diabetes mellitus, and hypothyroidism. She was unfortunately diagnosed with small cell lung cancer in 12/2019. Her cancer is managed by Dr. Julien Nordmann. She is currently undergoing systemic chemotherapy for management.  IR requested by Dr. Julien Nordmann for possible image-guided Port-a-cath placement. Patient awake and alert sitting in bed. Complains of headache secondary to low blood sugar, stable. Complains of "chest heaviness" secondary to tumor burden, stable. Denies fever, chills, chest pain, dyspnea, or abdominal pain.  LD Xarelto 02/16/2020.   Past Medical History:  Diagnosis Date  . CAD (coronary artery disease), native coronary artery    PTCA of distal right coronary artery 1997 Cypher stents to circumflex 2005 Cardiac cath in 2011 with patent stent to circumflex and moderate disease elsewhere treated medically   . Cancer ([place])   . Cardiac pacemaker in situ 12/24/2015   Original implant reportedly in 1991 for tachybradycardia syndrome, generator change in 1999, 2004 and evidently again in 2016 in [place]   . COPD (chronic obstructive pulmonary disease) ([place])   . Diabetes mellitus without complication ([place])   . Hypertension   . Hypothyroidism 03/23/2010  . Pacemaker   . Paroxysmal atrial fibrillation ([place]) 03/23/2010   CHA2DS2VASC score 5     Past Surgical History:  Procedure Laterality Date  . BIOPSY  01/08/2020   Procedure: BIOPSY;  Surgeon: Garner Nash, DO;  Location: [facility];  Service: Pulmonary;;  . BRONCHIAL BRUSHINGS  01/08/2020   Procedure:  BRONCHIAL BRUSHINGS;  Surgeon: Garner Nash, DO;  Location: [facility];  Service: Pulmonary;;  . BRONCHIAL WASHINGS  01/08/2020   Procedure: BRONCHIAL WASHINGS;  Surgeon: Garner Nash, DO;  Location: [facility];  Service: Pulmonary;;  . CARDIAC CATHETERIZATION N/A 12/26/2015   Procedure: Left Heart Cath and Coronary Angiography;  Surgeon: Peter M [name], MD;  Location: [facility];  Service: Cardiovascular;  Laterality: N/A;  . CARDIOVERSION  05/2015  . CHOLECYSTECTOMY  2012  . CORONARY ANGIOPLASTY WITH STENT PLACEMENT  1990  . ENDOBRONCHIAL ULTRASOUND  01/08/2020   Procedure: ENDOBRONCHIAL ULTRASOUND;  Surgeon: Garner Nash, DO;  Location: [facility];  Service: Pulmonary;;  . FINE NEEDLE ASPIRATION BIOPSY  01/08/2020   Procedure: FINE NEEDLE ASPIRATION BIOPSY;  Surgeon: Garner Nash, DO;  Location: [facility];  Service: Pulmonary;;  . HERNIA REPAIR    . PACEMAKER GENERATOR CHANGE  [id number], 2016  . PACEMAKER INSERTION  1991  . VIDEO BRONCHOSCOPY WITH ENDOBRONCHIAL ULTRASOUND N/A 01/08/2020   Procedure: VIDEO BRONCHOSCOPY;  Surgeon: Garner Nash, DO;  Location: [facility];  Service: Pulmonary;  Laterality: N/A;    Allergies: Patient has no known allergies.  Medications: Prior to Admission medications   Medication Sig Start Date End Date Taking? Authorizing Provider  acetaminophen (TYLENOL) 500 MG tablet Take 500 mg by mouth every 6 (six) hours as needed for headache.    [provider]  albuterol (VENTOLIN HFA) 108 (90 Base) MCG/ACT inhaler TAKE 2 PUFFS BY MOUTH EVERY 6 HOURS AS NEEDED FOR WHEEZE OR SHORTNESS OF BREATH*NOT COVERED* Patient taking differently: Inhale 2 puffs into the lungs every 6 (six) hours as needed for wheezing or shortness of breath.  03/30/19   [name], [name], MD  atorvastatin (LIPITOR) 10 MG tablet TAKE 1 TABLET BY MOUTH EVERY DAY 02/01/20   Isaiah Serge, NP  carvedilol (COREG) 12.5 MG tablet TAKE 1 TABLET (12.5 MG TOTAL) BY  MOUTH 2 (TWO) TIMES DAILY WITH A MEAL. 10/12/19   Laurey Morale, MD  [facility] NICOTINE TRANSDERMAL SYS 14 MG/24HR patch PLACE 1 PATCH (14 MG TOTAL) ONTO THE SKIN DAILY. 12/26/18   [name], [name], MD  diclofenac sodium (VOLTAREN) 1 % GEL Apply 4 g topically 4 (four) times daily. Patient taking differently: Apply 4 g topically 4 (four) times daily as needed (pain).  11/12/18   [name], [name], MD  dicyclomine (BENTYL) 10 MG capsule TAKE 1 CAPSULE (10 MG TOTAL) BY MOUTH 4 (FOUR) TIMES DAILY - BEFORE MEALS AND AT BEDTIME. 01/04/20   [name], [name], MD  fenofibrate (TRICOR) 48 MG tablet TAKE 1 TABLET BY MOUTH EVERY DAY Patient taking differently: Take 48 mg by mouth daily after lunch.  11/13/19   [name], [name], MD  glucose blood [facility] VERIO) test strip Use to test 3-4 times daily. 09/08/19   [name], [name], MD  levothyroxine (SYNTHROID) 112 MCG tablet TAKE 1 TABLET (112 MCG TOTAL) BY MOUTH DAILY BEFORE BREAKFAST. 07/01/19   [name], [name], MD  linagliptin (TRADJENTA) 5 MG TABS tablet TAKE 1 TABLET (5 MG TOTAL) DAILY BY MOUTH. Patient taking differently: Take 5 mg by mouth daily after breakfast.  12/23/19   [name], [name], MD  losartan (COZAAR) 50 MG tablet Take 2 tablets by mouth daily. Patient taking differently: Take 100 mg by mouth daily.  12/23/19   [name], [name], MD  nitroGLYCERIN (NITROSTAT) 0.4 MG SL tablet Place 1 tablet (0.4 mg total) under the tongue every 5 (five) minutes x 3 doses as needed for chest pain (Max 3 doses within 15 min. Call 911). 12/30/19   [name], [name], MD  oxyCODONE-acetaminophen (PERCOCET/ROXICET) 5-325 MG tablet Take 1 tablet by mouth every 8 (eight) hours as needed for severe pain. 02/10/20   Curt Bears, MD  pantoprazole (PROTONIX) 40 MG tablet TAKE 1 TABLET BY MOUTH EVERY DAY 02/08/20   [name], [name], MD  potassium chloride SA (KLOR-CON) 20 MEQ tablet 1 tab bid x 2 then 1 tab daily Patient not taking: Reported on 02/01/2020 12/15/19 01/15/20  [name], [name], MD    prochlorperazine (COMPAZINE) 10 MG tablet Take 1 tablet (10 mg total) by mouth every 6 (six) hours as needed for nausea or vomiting. 02/05/20   Curt Bears, MD  XARELTO 15 MG TABS tablet TAKE 1 TABLET BY MOUTH DAILY WITH SUPPER. Patient taking differently: Take 15 mg by mouth daily with supper.  12/16/19   Camnitz, Ocie Doyne, MD     Family History  Problem Relation Age of Onset  . Heart disease Father   . Heart disease Mother   . Heart attack Mother   . Cirrhosis Brother   . Colon cancer Neg Hx   . Stomach cancer Neg Hx     Social History   Socioeconomic History  . Marital status: Widowed    Spouse name: Not on file  . Number of children: Not on file  . Years of education: Not on file  . Highest education level: Not on file  Occupational History  . Occupation: retired  Tobacco Use  . Smoking status: Former Smoker    Packs/day: 0.75    Years: 66.00    Pack years: 49.50  Quit date: 12/27/2019    Years since quitting: 0.1  . Smokeless tobacco: Never Used  Substance and Sexual Activity  . Alcohol use: No    Alcohol/week: 0.0 standard drinks  . Drug use: No  . Sexual activity: Not on file  Other Topics Concern  . Not on file  Social History Narrative  . Not on file   Social Determinants of Health   Financial Resource Strain:   . Difficulty of Paying Living Expenses:   Food Insecurity:   . Worried About Charity fundraiser in the Last Year:   . Arboriculturist in the Last Year:   Transportation Needs:   . Film/video editor (Medical):   Marland Kitchen Lack of Transportation (Non-Medical):   Physical Activity:   . Days of Exercise per Week:   . Minutes of Exercise per Session:   Stress:   . Feeling of Stress :   Social Connections:   . Frequency of Communication with Friends and Family:   . Frequency of Social Gatherings with Friends and Family:   . Attends Religious Services:   . Active Member of Clubs or Organizations:   . Attends Archivist Meetings:    Marland Kitchen Marital Status:      Review of Systems: A 12 point ROS discussed and pertinent positives are indicated in the HPI above.  All other systems are negative.  Review of Systems  Constitutional: Negative for chills and fever.  Respiratory: Negative for shortness of breath and wheezing.        Positive for chest heaviness.  Cardiovascular: Negative for chest pain and palpitations.  Gastrointestinal: Negative for abdominal pain.  Neurological: Positive for headaches.  Psychiatric/Behavioral: Negative for behavioral problems and confusion.    Vital Signs: BP (!) 117/53 (BP Location: Left Arm)   Pulse 64   Temp 98.7 F (37.1 C) (Oral)   Resp 18   LMP  (LMP Unknown)   SpO2 99%   Physical Exam Vitals and nursing note reviewed.  Constitutional:      General: She is not in acute distress.    Appearance: Normal appearance.  Cardiovascular:     Rate and Rhythm: Normal rate and regular rhythm.     Heart sounds: Normal heart sounds. No murmur.  Pulmonary:     Effort: Pulmonary effort is normal. No respiratory distress.     Breath sounds: Normal breath sounds. No wheezing.  Skin:    General: Skin is warm and dry.  Neurological:     Mental Status: She is alert and oriented to person, place, and time.      MD Evaluation Airway: WNL Heart: WNL Abdomen: WNL Chest/ Lungs: WNL ASA  Classification: 3 Mallampati/Airway Score: One   Imaging: CUP PACEART REMOTE DEVICE CHECK  Result Date: 02/10/2020 Scheduled remote reviewed. Normal device function.  8% AF burden. Known PAF, takes Xarelto. Next remote 91 days. Felisa Bonier, RN, MSNMay [phone], 2021 16:15 - Yellow Alert - Atrial Arrhythmia Burden of at least 6.0 hours in a 24 hour period.   Labs:  CBC: Recent Labs    01/19/20 1814 02/09/20 1058 02/15/20 1415 02/18/20 1238  WBC 6.2 5.6 2.8* 9.3  HGB 11.5* 10.3* 10.1* 12.2  HCT 37.2 33.1* 32.6* 39.2  PLT 225 182 198 163    COAGS: No results for input(s): INR, APTT in the last  8760 hours.  BMP: Recent Labs    01/06/20 0505 01/19/20 1814 02/09/20 1058 02/15/20 1415  NA 139 138 138 138  K 4.1 4.7 4.1 4.4  CL 106 103 105 101  CO2 22 25 26 30   GLUCOSE 78 123* 148* 122*  BUN 13 17 17 23   CALCIUM 9.1 9.7 9.1 9.1  CREATININE 0.67 0.92 0.82 0.82  GFRNONAA >60 58* >60 >60  GFRAA >60 >60 >60 >60    LIVER FUNCTION TESTS: Recent Labs    12/27/19 1551 01/01/20 0910 02/09/20 1058 02/15/20 1415  BILITOT 0.5 0.3 0.2* 0.2*  AST 15 13* 14* 13*  ALT 12 10 11 21   ALKPHOS 52 61 54 57  PROT 6.7 7.0 6.7 6.3*  ALBUMIN 3.0* 3.0* 2.8* 2.8*     Assessment and Plan:  Small cell lung cancer with tentative plans to begin systemic chemotherapy as management. Plan for image-guided Port-a-cath placement today in IR. Patient is NPO. Afebrile. Blood glucose 67 today- 1 amp D 50 given.  Risks and benefits of image-guided Port-a-catheter placement were discussed with the patient including, but not limited to bleeding, infection, pneumothorax, or fibrin sheath development and need for additional procedures. All of the patient's questions were answered, patient is agreeable to proceed. Consent signed and in chart.   Thank you for this interesting consult.  I greatly enjoyed meeting [name] and look forward to participating in their care.  A copy of this report was sent to the requesting provider on this date.  Electronically Signed: Earley Abide, PA-C 02/18/2020, 12:55 PM   I spent a total of 30 Minutes in face to face in clinical consultation, greater than 50% of which was counseling/coordinating care for lung cancer/Port-a-cath placement.

## 2020-02-18 NOTE — Progress Notes (Signed)
°  Chronic Care Management   Outreach Note  02/18/2020 Name: [name] MRN: [id number] DOB: 1935/02/20  Referred by: [name], [name], MD Reason for referral : No chief complaint on file.   An unsuccessful telephone outreach was attempted today. The patient was referred to the pharmacist for assistance with care management and care coordination.   Follow Up Plan:   Prathima Ghanta Upstream Scheduler

## 2020-02-18 NOTE — Telephone Encounter (Signed)
Spoke to dtr  Informed that our office did not advise on stopping her Xarelto so advising provider should give them directions on restarting. dtr will call and speak w/ oncology tomorrow to discuss. She appreciates my return call.

## 2020-02-21 ENCOUNTER — Emergency Department (HOSPITAL_COMMUNITY): Payer: Medicare Other

## 2020-02-21 ENCOUNTER — Emergency Department (HOSPITAL_COMMUNITY)
Admission: EM | Admit: 2020-02-21 | Discharge: 2020-02-21 | Disposition: A | Discharge status: Home / Self Care | Payer: Medicare Other | Attending: Emergency Medicine | Admitting: Emergency Medicine

## 2020-02-21 ENCOUNTER — Encounter (HOSPITAL_COMMUNITY): Payer: Self-pay

## 2020-02-21 ENCOUNTER — Other Ambulatory Visit: Payer: Self-pay

## 2020-02-21 DIAGNOSIS — R079 Chest pain, unspecified: Secondary | ICD-10-CM

## 2020-02-21 DIAGNOSIS — I2510 Atherosclerotic heart disease of native coronary artery without angina pectoris: Secondary | ICD-10-CM | POA: Insufficient documentation

## 2020-02-21 DIAGNOSIS — Z950 Presence of cardiac pacemaker: Secondary | ICD-10-CM | POA: Insufficient documentation

## 2020-02-21 DIAGNOSIS — I10 Essential (primary) hypertension: Secondary | ICD-10-CM | POA: Insufficient documentation

## 2020-02-21 DIAGNOSIS — E039 Hypothyroidism, unspecified: Secondary | ICD-10-CM | POA: Insufficient documentation

## 2020-02-21 DIAGNOSIS — J449 Chronic obstructive pulmonary disease, unspecified: Secondary | ICD-10-CM | POA: Diagnosis not present

## 2020-02-21 DIAGNOSIS — R0789 Other chest pain: Secondary | ICD-10-CM | POA: Diagnosis not present

## 2020-02-21 DIAGNOSIS — Z79899 Other long term (current) drug therapy: Secondary | ICD-10-CM | POA: Insufficient documentation

## 2020-02-21 DIAGNOSIS — Z87891 Personal history of nicotine dependence: Secondary | ICD-10-CM | POA: Diagnosis not present

## 2020-02-21 DIAGNOSIS — I4891 Unspecified atrial fibrillation: Secondary | ICD-10-CM | POA: Diagnosis not present

## 2020-02-21 DIAGNOSIS — C3411 Malignant neoplasm of upper lobe, right bronchus or lung: Secondary | ICD-10-CM | POA: Diagnosis not present

## 2020-02-21 DIAGNOSIS — R072 Precordial pain: Secondary | ICD-10-CM | POA: Diagnosis not present

## 2020-02-21 DIAGNOSIS — Z85118 Personal history of other malignant neoplasm of bronchus and lung: Secondary | ICD-10-CM | POA: Insufficient documentation

## 2020-02-21 LAB — CBC
HCT: 29.8 % — ABNORMAL LOW (ref 36.0–46.0)
Hemoglobin: 9.2 g/dL — ABNORMAL LOW (ref 12.0–15.0)
MCH: 28.2 pg (ref 26.0–34.0)
MCHC: 30.9 g/dL (ref 30.0–36.0)
MCV: 91.4 fL (ref 80.0–100.0)
Platelets: 106 10*3/uL — ABNORMAL LOW (ref 150–400)
RBC: 3.26 MIL/uL — ABNORMAL LOW (ref 3.87–5.11)
RDW: 14.7 % (ref 11.5–15.5)
WBC: 5 10*3/uL (ref 4.0–10.5)
nRBC: 0 % (ref 0.0–0.2)

## 2020-02-21 LAB — TROPONIN I (HIGH SENSITIVITY)
Troponin I (High Sensitivity): 11 ng/L (ref <18)
Troponin I (High Sensitivity): 12 ng/L (ref <18)

## 2020-02-21 LAB — BASIC METABOLIC PANEL
Anion gap: 7 (ref 5–15)
BUN: 13 mg/dL (ref 8–23)
CO2: 26 mmol/L (ref 22–32)
Calcium: 8.8 mg/dL — ABNORMAL LOW (ref 8.9–10.3)
Chloride: 107 mmol/L (ref 98–111)
Creatinine, Ser: 0.59 mg/dL (ref 0.44–1.00)
GFR calc Af Amer: >60 mL/min (ref >60)
GFR calc non Af Amer: >60 mL/min (ref >60)
Glucose, Bld: 100 mg/dL — ABNORMAL HIGH (ref 70–99)
Potassium: 4 mmol/L (ref 3.5–5.1)
Sodium: 140 mmol/L (ref 135–145)

## 2020-02-21 MED ORDER — ASPIRIN 325 MG PO TABS
325.0000 mg | ORAL_TABLET | Freq: Every day | ORAL | Status: DC
  Administered 2020-02-21: 325 mg via ORAL
  Filled 2020-02-21: qty 1

## 2020-02-21 MED ORDER — SODIUM CHLORIDE 0.9% FLUSH
3.0000 mL | Freq: Once | INTRAVENOUS | Status: AC
  Administered 2020-02-21: 3 mL via INTRAVENOUS

## 2020-02-21 MED ORDER — IOHEXOL 350 MG/ML SOLN
80.0000 mL | Freq: Once | INTRAVENOUS | Status: AC | PRN
  Administered 2020-02-21: 80 mL via INTRAVENOUS

## 2020-02-21 NOTE — ED Triage Notes (Signed)
Patient complains of chest pain with back pain x 1 day, states that she thinks related to having port placed this past Thursday, appears to be guarding on assessment

## 2020-02-21 NOTE — ED Provider Notes (Signed)
[facility] EMERGENCY DEPARTMENT Provider Note   CSN: [id number] Arrival date & time: 02/21/20  1418     History Chief Complaint  Patient presents with  . cp/ back pain/ port placed Thursday    [name] is a 85 y.o. female with PMHx CAD, HTN, hypothyroidism, A fib, cardiac pacemaker, and small cell lung cancer who presents to the ED today complaining of sudden onset, constant, sharp/stabbing, substernal chest pain that began this morning around 5 AM.  She also complains of some lower back spasms.  She states that she recently had a port placed on Thursday 5/20 and was told that if she had any chest pain afterwards she would need to come to the ED right away.  Took Tylenol earlier today without relief.  States she feels mildly short of breath.  She denies any nausea, vomiting, diaphoresis, leg swelling, any other associated symptoms.  He does mention that she was told to stop taking her Xarelto 1.5 days prior to her procedure.  She however did not start taking it until about 2 days after procedure due to some confusion about 1 to start again.   The history is provided by the patient, a relative and medical records.       Past Medical History:  Diagnosis Date  . CAD (coronary artery disease), native coronary artery    PTCA of distal right coronary artery 1997 Cypher stents to circumflex 2005 Cardiac cath in 2011 with patent stent to circumflex and moderate disease elsewhere treated medically   . Cancer ([place])   . Cardiac pacemaker in situ 12/24/2015   Original implant reportedly in 1991 for tachybradycardia syndrome, generator change in 1999, 2004 and evidently again in 2016 in [place]   . COPD (chronic obstructive pulmonary disease) ([place])   . Diabetes mellitus without complication ([place])   . Hypertension   . Hypothyroidism 03/23/2010  . Pacemaker   . Paroxysmal atrial fibrillation ([place]) 03/23/2010   CHA2DS2VASC score 5     Patient Active Problem List   Diagnosis Date Noted  . Small cell lung cancer, right ([place]) 01/28/2020  . Encounter for antineoplastic chemotherapy 01/28/2020  . Encounter for antineoplastic immunotherapy 01/28/2020  . Goals of care, counseling/discussion 01/28/2020  . Lung cancer ([place]) 01/06/2020  . Near syncope 01/05/2020  . Mediastinal lymphadenopathy 01/01/2020  . Multiple lung nodules on CT 01/01/2020  . Chronic diarrhea 12/15/2019  . Hyperlipidemia associated with type 2 diabetes mellitus ([place]) 06/10/2019  . Chronic rhinitis 02/28/2018  . Tobacco use disorder 05/10/2017  . Hypertension with heart disease 05/10/2017  . Chest pain 03/10/2017  . Acute thoracic back pain   . Cardiac pacemaker in situ 12/24/2015  . History of Graves' disease 12/24/2015  . Current use of long term anticoagulation 12/24/2015  . Type 2 diabetes mellitus with other specified complication ([place])   . Hypothyroidism 03/23/2010  . Paroxysmal atrial fibrillation ([place]) 03/23/2010  . COPD (chronic obstructive pulmonary disease) ([place]) 03/23/2010  . CAD (coronary artery disease), native coronary artery   . GERD     Past Surgical History:  Procedure Laterality Date  . BIOPSY  01/08/2020   Procedure: BIOPSY;  Surgeon: Garner Nash, DO;  Location: [facility];  Service: Pulmonary;;  . BRONCHIAL BRUSHINGS  01/08/2020   Procedure: BRONCHIAL BRUSHINGS;  Surgeon: Garner Nash, DO;  Location: [facility];  Service: Pulmonary;;  . BRONCHIAL WASHINGS  01/08/2020   Procedure: BRONCHIAL WASHINGS;  Surgeon: Garner Nash, DO;  Location: [facility];  Service: Pulmonary;;  . CARDIAC CATHETERIZATION N/A 12/26/2015   Procedure: Left Heart Cath and Coronary Angiography;  Surgeon: Peter M [name], MD;  Location: [facility];  Service: Cardiovascular;  Laterality: N/A;  . CARDIOVERSION  05/2015  . CHOLECYSTECTOMY  2012  . CORONARY ANGIOPLASTY WITH STENT PLACEMENT  1990  . ENDOBRONCHIAL ULTRASOUND  01/08/2020   Procedure: ENDOBRONCHIAL  ULTRASOUND;  Surgeon: Garner Nash, DO;  Location: [facility];  Service: Pulmonary;;  . FINE NEEDLE ASPIRATION BIOPSY  01/08/2020   Procedure: FINE NEEDLE ASPIRATION BIOPSY;  Surgeon: Garner Nash, DO;  Location: [facility];  Service: Pulmonary;;  . HERNIA REPAIR    . IR IMAGING GUIDED PORT INSERTION  02/18/2020  . PACEMAKER GENERATOR CHANGE  [id number], 2016  . PACEMAKER INSERTION  1991  . VIDEO BRONCHOSCOPY WITH ENDOBRONCHIAL ULTRASOUND N/A 01/08/2020   Procedure: VIDEO BRONCHOSCOPY;  Surgeon: Garner Nash, DO;  Location: [facility];  Service: Pulmonary;  Laterality: N/A;     OB History   No obstetric history on file.     Family History  Problem Relation Age of Onset  . Heart disease Father   . Heart disease Mother   . Heart attack Mother   . Cirrhosis Brother   . Colon cancer Neg Hx   . Stomach cancer Neg Hx     Social History   Tobacco Use  . Smoking status: Former Smoker    Packs/day: 0.75    Years: 66.00    Pack years: 49.50    Quit date: 12/27/2019    Years since quitting: 0.1  . Smokeless tobacco: Never Used  Substance Use Topics  . Alcohol use: No    Alcohol/week: 0.0 standard drinks  . Drug use: No    Home Medications Prior to Admission medications   Medication Sig Start Date End Date Taking? Authorizing Provider  acetaminophen (TYLENOL) 500 MG tablet Take 500 mg by mouth every 6 (six) hours as needed for headache.    [provider]  albuterol (VENTOLIN HFA) 108 (90 Base) MCG/ACT inhaler TAKE 2 PUFFS BY MOUTH EVERY 6 HOURS AS NEEDED FOR WHEEZE OR SHORTNESS OF BREATH*NOT COVERED* Patient taking differently: Inhale 2 puffs into the lungs every 6 (six) hours as needed for wheezing or shortness of breath.  03/30/19   [name], [name], MD  atorvastatin (LIPITOR) 10 MG tablet TAKE 1 TABLET BY MOUTH EVERY DAY 02/01/20   Isaiah Serge, NP  carvedilol (COREG) 12.5 MG tablet TAKE 1 TABLET (12.5 MG TOTAL) BY MOUTH 2 (TWO) TIMES DAILY WITH A MEAL.  10/12/19   Laurey Morale, MD  [facility] NICOTINE TRANSDERMAL SYS 14 MG/24HR patch PLACE 1 PATCH (14 MG TOTAL) ONTO THE SKIN DAILY. 12/26/18   [name], [name], MD  diclofenac sodium (VOLTAREN) 1 % GEL Apply 4 g topically 4 (four) times daily. Patient taking differently: Apply 4 g topically 4 (four) times daily as needed (pain).  11/12/18   [name], [name], MD  dicyclomine (BENTYL) 10 MG capsule TAKE 1 CAPSULE (10 MG TOTAL) BY MOUTH 4 (FOUR) TIMES DAILY - BEFORE MEALS AND AT BEDTIME. 01/04/20   [name], [name], MD  fenofibrate (TRICOR) 48 MG tablet TAKE 1 TABLET BY MOUTH EVERY DAY Patient taking differently: Take 48 mg by mouth daily after lunch.  11/13/19   [name], [name], MD  glucose blood [facility] VERIO) test strip Use to test 3-4 times daily. 09/08/19   [name], [name], MD  levothyroxine (SYNTHROID) 112 MCG tablet TAKE  1 TABLET (112 MCG TOTAL) BY MOUTH DAILY BEFORE BREAKFAST. 07/01/19   [name], [name], MD  linagliptin (TRADJENTA) 5 MG TABS tablet TAKE 1 TABLET (5 MG TOTAL) DAILY BY MOUTH. Patient taking differently: Take 5 mg by mouth daily after breakfast.  12/23/19   [name], [name], MD  losartan (COZAAR) 50 MG tablet Take 2 tablets by mouth daily. Patient taking differently: Take 100 mg by mouth daily.  12/23/19   [name], [name], MD  nitroGLYCERIN (NITROSTAT) 0.4 MG SL tablet Place 1 tablet (0.4 mg total) under the tongue every 5 (five) minutes x 3 doses as needed for chest pain (Max 3 doses within 15 min. Call 911). 12/30/19   [name], [name], MD  oxyCODONE-acetaminophen (PERCOCET/ROXICET) 5-325 MG tablet Take 1 tablet by mouth every 8 (eight) hours as needed for severe pain. 02/10/20   Curt Bears, MD  pantoprazole (PROTONIX) 40 MG tablet TAKE 1 TABLET BY MOUTH EVERY DAY 02/08/20   [name], [name], MD  potassium chloride SA (KLOR-CON) 20 MEQ tablet 1 tab bid x 2 then 1 tab daily Patient not taking: Reported on 02/01/2020 12/15/19 01/15/20  [name], [name], MD  prochlorperazine (COMPAZINE) 10 MG tablet  Take 1 tablet (10 mg total) by mouth every 6 (six) hours as needed for nausea or vomiting. 02/05/20   Curt Bears, MD  XARELTO 15 MG TABS tablet TAKE 1 TABLET BY MOUTH DAILY WITH SUPPER. Patient taking differently: Take 15 mg by mouth daily with supper.  12/16/19   Camnitz, Ocie Doyne, MD    Allergies    Patient has no known allergies.  Review of Systems   Review of Systems  Constitutional: Negative for chills and fever.  Respiratory: Positive for shortness of breath. Negative for cough.   Cardiovascular: Positive for chest pain.  Gastrointestinal: Negative for abdominal pain, nausea and vomiting.  Musculoskeletal: Positive for back pain.  All other systems reviewed and are negative.   Physical Exam Updated Vital Signs BP 135/61   Pulse 60   Temp 98.4 F (36.9 C) (Oral)   Resp (!) 22   Ht 5\' 2"  (1.575 m)   Wt 51.3 kg   LMP  (LMP Unknown)   SpO2 100%   BMI 20.67 kg/m   Physical Exam Vitals and nursing note reviewed.  Constitutional:      Appearance: She is not ill-appearing or diaphoretic.  HENT:     Head: Normocephalic and atraumatic.  Eyes:     Conjunctiva/sclera: Conjunctivae normal.  Cardiovascular:     Rate and Rhythm: Normal rate and regular rhythm.     Pulses: Normal pulses.  Pulmonary:     Effort: Pulmonary effort is normal.     Breath sounds: Normal breath sounds. No wheezing, rhonchi or rales.     Comments: Pacemaker to R chest wall. Post to left chest wall. + substernal chest wall TTP.  Abdominal:     Palpations: Abdomen is soft.     Tenderness: There is no abdominal tenderness. There is no guarding or rebound.  Musculoskeletal:     Cervical back: Neck supple.     Comments: No C, T, or L midline spinal TTP. + left lumbar paraspinal musculature TTP.   Skin:    General: Skin is warm and dry.  Neurological:     Mental Status: She is alert.     ED Results / Procedures / Treatments   Labs (all labs ordered are listed, but only abnormal results are  displayed) Labs Reviewed  BASIC  METABOLIC PANEL - Abnormal; Notable for the following components:      Result Value   Glucose, Bld 100 (*)    Calcium 8.8 (*)    All other components within normal limits  CBC - Abnormal; Notable for the following components:   RBC 3.26 (*)    Hemoglobin 9.2 (*)    HCT 29.8 (*)    Platelets 106 (*)    All other components within normal limits  TROPONIN I (HIGH SENSITIVITY)  TROPONIN I (HIGH SENSITIVITY)    EKG None  Radiology DG Chest 2 View  Result Date: 02/21/2020 CLINICAL DATA:  Chest pain.  Back pain. EXAM: CHEST - 2 VIEW COMPARISON:  January 05, 2020 FINDINGS: Stable pacemaker. The heart, hila, and mediastinum are stable. A left Port-A-Cath terminates in the central SVC. No pneumothorax. Mild bibasilar atelectasis. No suspicious infiltrates. No overt edema. No nodules or masses. IMPRESSION: No acute abnormalities are identified. Electronically Signed   By: Dorise Bullion III M.D   On: 02/21/2020 14:54   CT Angio Chest PE W/Cm &/Or Wo Cm  Result Date: 02/21/2020 CLINICAL DATA:  Chest and back pain, right upper lobe lung cancer EXAM: CT ANGIOGRAPHY CHEST WITH CONTRAST TECHNIQUE: Multidetector CT imaging of the chest was performed using the standard protocol during bolus administration of intravenous contrast. Multiplanar CT image reconstructions and MIPs were obtained to evaluate the vascular anatomy. CONTRAST:  79mL OMNIPAQUE IOHEXOL 350 MG/ML SOLN COMPARISON:  01/15/2020, 12/27/2019 FINDINGS: Cardiovascular: This is a technically adequate evaluation of the pulmonary vasculature. There are no filling defects or pulmonary emboli. The heart is enlarged but stable. Pacemaker unchanged. Stable atherosclerosis of the aorta and coronary vasculature. Mediastinum/Nodes: Diffuse adenopathy throughout the mediastinum and bilateral hilar regions. Index lymph node within the anterior mediastinum reference image 45/5 measures 18 x 28 mm, unchanged since recent PET  scan. The thyroid, trachea, and esophagus are grossly unremarkable. Lungs/Pleura: The spiculated right upper lobe mass seen previously now measures 2.5 x 2.2 cm reference image 51/5. On prior PET scan, this had measured approximately 2.6 x 3.2 cm. The left lower lobe mass measures 2.7 x 1.8 cm, unchanged since PET scan. No new pulmonary nodules or masses. Severe upper lobe predominant emphysema again seen. No new airspace disease, effusion, or pneumothorax. The central airways are patent. Upper Abdomen: No acute abnormality. Musculoskeletal: There are no acute or destructive bony lesions. Reconstructed images demonstrate no additional findings. Review of the MIP images confirms the above findings. IMPRESSION: 1. No evidence of pulmonary embolus. 2. Spiculated right upper lobe mass consistent with known lung cancer. Slight decreased size since prior study. 3. Stable left lower lobe mass and mediastinal/hilar adenopathy, consistent with intrathoracic metastases. 4. Aortic Atherosclerosis (ICD10-I70.0) and Emphysema (ICD10-J43.9). Electronically Signed   By: Randa Ngo M.D.   On: 02/21/2020 19:24    Procedures Procedures (including critical care time)  Medications Ordered in ED Medications  aspirin tablet 325 mg (325 mg Oral Given 02/21/20 1639)  sodium chloride flush (NS) 0.9 % injection 3 mL (3 mLs Intravenous Given 02/21/20 1648)  iohexol (OMNIPAQUE) 350 MG/ML injection 80 mL (80 mLs Intravenous Contrast Given 02/21/20 1916)    ED Course  I have reviewed the triage vital signs and the nursing notes.  Pertinent labs & imaging results that were available during my care of the patient were reviewed by me and considered in my medical decision making (see chart for details).    MDM Rules/Calculators/A&P  85 year old female presenting to the ED today with complaint of substernal chest pain that began earlier today. Just had a port placed for her small cell cancer and is concerned  that could be the cause. Took tylenol without relief. On arrival to the ED pt is afebrile, nontachycardic, and nontachypneic. Appears to be in NAD. She has substernal chest pain on plapation.  Dr. Pain is likely related to port placement however will work-up for ACS with history.  Patient does states she was off of her anticoagulant for a couple of days, will obtain CTA to ensure that there is no PE, this will also give further evaluation of her port placement.  Have discussed case with attending physician Dr. Francia Greaves who agrees with plan.  Aspirin given for patient comfort.   CBC without leukocytosis.  Hemoglobin 9.2, slight drop however would suspect this with patient's recent procedure.   Hemoglobin  Date Value Ref Range Status  02/21/2020 9.2 (L) 12.0 - 15.0 g/dL Final  02/18/2020 12.2 12.0 - 15.0 g/dL Final  02/15/2020 10.1 (L) 12.0 - 15.0 g/dL Final  02/09/2020 10.3 (L) 12.0 - 15.0 g/dL Final  01/19/2020 11.5 (L) 12.0 - 15.0 g/dL Final  01/06/2020 10.8 (L) 12.0 - 15.0 g/dL Final  01/01/2020 10.6 (L) 12.0 - 15.0 g/dL Final   BMP without any electrolyte abnormalities. Initial troponin of 12, will repeat. X-ray clear.  Repeat troponin of 12. CTA negative for PE. IMPRESSION:  1. No evidence of pulmonary embolus.  2. Spiculated right upper lobe mass consistent with known lung  cancer. Slight decreased size since prior study.  3. Stable left lower lobe mass and mediastinal/hilar adenopathy,  consistent with intrathoracic metastases.  4. Aortic Atherosclerosis (ICD10-I70.0) and Emphysema (ICD10-J43.9).    Discussed case with Dr. Lorenso Courier with med oncology who reports that if no signs of thrombosis or port placement issues then no concern from oncology standpoint.   Discussed case with cardiology who believes pt stable for discharge home with negative cardiac markers.   Discharge patient home at this time.  It does appear she has Percocet at home to take as needed, have advised.  Have  advised that she contact Dr. Earlie Server oncology tomorrow to schedule an appointment for further evaluation.  Mom and daughter strictly encouraged to return to the ED for any worsening symptoms including worsening chest pain, shortness of breath, coughing up blood, passing out, excessive sweating, nausea, vomiting, any other associated symptoms.  Mom and patient are both in agreement with plan and patient stable for discharge.   This note was prepared using Dragon voice recognition software and may include unintentional dictation errors due to the inherent limitations of voice recognition software.  Final Clinical Impression(s) / ED Diagnoses Final diagnoses:  Nonspecific chest pain    Rx / DC Orders ED Discharge Orders    None       Discharge Instructions     Please follow up with Dr. Julien Nordmann tomorrow regarding your ED visit Take your pain medication as needed Return to the ED IMMEDIATELY for any worsening symptoms including worsening chest pain, worsening shortness of breath, nausea, vomiting, excessive sweating, or any other concerning symptoms       Eustaquio Maize, Hershal Coria 02/21/20 2137    Valarie Merino, MD 02/22/20 1534

## 2020-02-21 NOTE — Discharge Instructions (Addendum)
Please follow up with Dr. Julien Nordmann tomorrow regarding your ED visit Take your pain medication as needed Return to the ED IMMEDIATELY for any worsening symptoms including worsening chest pain, worsening shortness of breath, nausea, vomiting, excessive sweating, or any other concerning symptoms

## 2020-02-21 NOTE — ED Notes (Signed)
Patient verbalizes understanding of discharge instructions. Opportunity for questioning and answers were provided. Armband removed by staff, pt discharged from ED to [facility]

## 2020-02-23 ENCOUNTER — Ambulatory Visit
Admission: EM | Admit: 2020-02-23 | Discharge: 2020-02-23 | Disposition: A | Discharge status: Home / Self Care | Payer: Medicare Other | Attending: Physician Assistant | Admitting: Physician Assistant

## 2020-02-23 DIAGNOSIS — T148XXA Other injury of unspecified body region, initial encounter: Secondary | ICD-10-CM

## 2020-02-23 DIAGNOSIS — Z5189 Encounter for other specified aftercare: Secondary | ICD-10-CM | POA: Diagnosis not present

## 2020-02-23 MED ORDER — MUPIROCIN 2 % EX OINT: 1.0000 "application " | TOPICAL_OINTMENT | Freq: Two times a day (BID) | CUTANEOUS | 0 refills | Status: DC

## 2020-02-23 NOTE — ED Triage Notes (Signed)
Pt states had a porta cath placed last Thursday. States has a skin tear from the tape that is covering her porta cath.

## 2020-02-23 NOTE — ED Provider Notes (Signed)
[facility]    CSN: [id number] Arrival date & time: 02/23/20  1234      History   Chief Complaint Chief Complaint  Patient presents with  . Wound Check    HPI [name] is a 85 y.o. female.   85 year old female comes in with daughter for wound check. Had porta cath placed 6 days ago. This morning, noticed wound around adhesive tape and came in for evaluation. Denies pain. Did have mild itching around adhesive tape. Denies swelling, erythema, warmth. Denies drainage from the porta cath. Denies fever.      Past Medical History:  Diagnosis Date  . CAD (coronary artery disease), native coronary artery    PTCA of distal right coronary artery 1997 Cypher stents to circumflex 2005 Cardiac cath in 2011 with patent stent to circumflex and moderate disease elsewhere treated medically   . Cancer ([place])   . Cardiac pacemaker in situ 12/24/2015   Original implant reportedly in 1991 for tachybradycardia syndrome, generator change in 1999, 2004 and evidently again in 2016 in [place]   . COPD (chronic obstructive pulmonary disease) ([place])   . Diabetes mellitus without complication ([place])   . Hypertension   . Hypothyroidism 03/23/2010  . Pacemaker   . Paroxysmal atrial fibrillation ([place]) 03/23/2010   CHA2DS2VASC score 5     Patient Active Problem List   Diagnosis Date Noted  . Small cell lung cancer, right ([place]) 01/28/2020  . Encounter for antineoplastic chemotherapy 01/28/2020  . Encounter for antineoplastic immunotherapy 01/28/2020  . Goals of care, counseling/discussion 01/28/2020  . Lung cancer ([place]) 01/06/2020  . Near syncope 01/05/2020  . Mediastinal lymphadenopathy 01/01/2020  . Multiple lung nodules on CT 01/01/2020  . Chronic diarrhea 12/15/2019  . Hyperlipidemia associated with type 2 diabetes mellitus ([place]) 06/10/2019  . Chronic rhinitis 02/28/2018  . Tobacco use disorder 05/10/2017  . Hypertension with heart disease 05/10/2017  . Chest pain 03/10/2017  .  Acute thoracic back pain   . Cardiac pacemaker in situ 12/24/2015  . History of Graves' disease 12/24/2015  . Current use of long term anticoagulation 12/24/2015  . Type 2 diabetes mellitus with other specified complication ([place])   . Hypothyroidism 03/23/2010  . Paroxysmal atrial fibrillation ([place]) 03/23/2010  . COPD (chronic obstructive pulmonary disease) ([place]) 03/23/2010  . CAD (coronary artery disease), native coronary artery   . GERD     Past Surgical History:  Procedure Laterality Date  . BIOPSY  01/08/2020   Procedure: BIOPSY;  Surgeon: Garner Nash, DO;  Location: [facility];  Service: Pulmonary;;  . BRONCHIAL BRUSHINGS  01/08/2020   Procedure: BRONCHIAL BRUSHINGS;  Surgeon: Garner Nash, DO;  Location: [facility];  Service: Pulmonary;;  . BRONCHIAL WASHINGS  01/08/2020   Procedure: BRONCHIAL WASHINGS;  Surgeon: Garner Nash, DO;  Location: [facility];  Service: Pulmonary;;  . CARDIAC CATHETERIZATION N/A 12/26/2015   Procedure: Left Heart Cath and Coronary Angiography;  Surgeon: Peter M [name], MD;  Location: [facility];  Service: Cardiovascular;  Laterality: N/A;  . CARDIOVERSION  05/2015  . CHOLECYSTECTOMY  2012  . CORONARY ANGIOPLASTY WITH STENT PLACEMENT  1990  . ENDOBRONCHIAL ULTRASOUND  01/08/2020   Procedure: ENDOBRONCHIAL ULTRASOUND;  Surgeon: Garner Nash, DO;  Location: [facility];  Service: Pulmonary;;  . FINE NEEDLE ASPIRATION BIOPSY  01/08/2020   Procedure: FINE NEEDLE ASPIRATION BIOPSY;  Surgeon: Garner Nash, DO;  Location: [facility];  Service: Pulmonary;;  . HERNIA REPAIR    .  IR IMAGING GUIDED PORT INSERTION  02/18/2020  . PACEMAKER GENERATOR CHANGE  [id number], 2016  . PACEMAKER INSERTION  1991  . VIDEO BRONCHOSCOPY WITH ENDOBRONCHIAL ULTRASOUND N/A 01/08/2020   Procedure: VIDEO BRONCHOSCOPY;  Surgeon: Garner Nash, DO;  Location: [facility];  Service: Pulmonary;  Laterality: N/A;    OB History   No obstetric history on  file.      Home Medications    Prior to Admission medications   Medication Sig Start Date End Date Taking? Authorizing Provider  acetaminophen (TYLENOL) 500 MG tablet Take 500 mg by mouth every 6 (six) hours as needed for headache.    [provider]  albuterol (VENTOLIN HFA) 108 (90 Base) MCG/ACT inhaler TAKE 2 PUFFS BY MOUTH EVERY 6 HOURS AS NEEDED FOR WHEEZE OR SHORTNESS OF BREATH*NOT COVERED* Patient taking differently: Inhale 2 puffs into the lungs every 6 (six) hours as needed for wheezing or shortness of breath.  03/30/19   [name], [name], MD  atorvastatin (LIPITOR) 10 MG tablet TAKE 1 TABLET BY MOUTH EVERY DAY 02/01/20   Isaiah Serge, NP  carvedilol (COREG) 12.5 MG tablet TAKE 1 TABLET (12.5 MG TOTAL) BY MOUTH 2 (TWO) TIMES DAILY WITH A MEAL. 10/12/19   Laurey Morale, MD  [facility] NICOTINE TRANSDERMAL SYS 14 MG/24HR patch PLACE 1 PATCH (14 MG TOTAL) ONTO THE SKIN DAILY. 12/26/18   [name], [name], MD  diclofenac sodium (VOLTAREN) 1 % GEL Apply 4 g topically 4 (four) times daily. Patient taking differently: Apply 4 g topically 4 (four) times daily as needed (pain).  11/12/18   [name], [name], MD  dicyclomine (BENTYL) 10 MG capsule TAKE 1 CAPSULE (10 MG TOTAL) BY MOUTH 4 (FOUR) TIMES DAILY - BEFORE MEALS AND AT BEDTIME. 01/04/20   [name], [name], MD  fenofibrate (TRICOR) 48 MG tablet TAKE 1 TABLET BY MOUTH EVERY DAY Patient taking differently: Take 48 mg by mouth daily after lunch.  11/13/19   [name], [name], MD  glucose blood [facility] VERIO) test strip Use to test 3-4 times daily. 09/08/19   [name], [name], MD  levothyroxine (SYNTHROID) 112 MCG tablet TAKE 1 TABLET (112 MCG TOTAL) BY MOUTH DAILY BEFORE BREAKFAST. 07/01/19   [name], [name], MD  linagliptin (TRADJENTA) 5 MG TABS tablet TAKE 1 TABLET (5 MG TOTAL) DAILY BY MOUTH. Patient taking differently: Take 5 mg by mouth daily after breakfast.  12/23/19   [name], [name], MD  losartan (COZAAR) 50 MG tablet Take 2 tablets by mouth  daily. Patient taking differently: Take 100 mg by mouth daily.  12/23/19   [name], [name], MD  mupirocin ointment (BACTROBAN) 2 % Apply 1 application topically 2 (two) times daily. 02/23/20   Tasia Catchings, Birney Belshe V, PA-C  nitroGLYCERIN (NITROSTAT) 0.4 MG SL tablet Place 1 tablet (0.4 mg total) under the tongue every 5 (five) minutes x 3 doses as needed for chest pain (Max 3 doses within 15 min. Call 911). 12/30/19   [name], [name], MD  oxyCODONE-acetaminophen (PERCOCET/ROXICET) 5-325 MG tablet Take 1 tablet by mouth every 8 (eight) hours as needed for severe pain. 02/10/20   Curt Bears, MD  pantoprazole (PROTONIX) 40 MG tablet TAKE 1 TABLET BY MOUTH EVERY DAY 02/08/20   [name], [name], MD  potassium chloride SA (KLOR-CON) 20 MEQ tablet 1 tab bid x 2 then 1 tab daily Patient not taking: Reported on 02/01/2020 12/15/19 01/15/20  [name], [name], MD  prochlorperazine (COMPAZINE) 10 MG tablet Take 1 tablet (  10 mg total) by mouth every 6 (six) hours as needed for nausea or vomiting. 02/05/20   Curt Bears, MD  XARELTO 15 MG TABS tablet TAKE 1 TABLET BY MOUTH DAILY WITH SUPPER. Patient taking differently: Take 15 mg by mouth daily with supper.  12/16/19   Camnitz, Ocie Doyne, MD    Family History Family History  Problem Relation Age of Onset  . Heart disease Father   . Heart disease Mother   . Heart attack Mother   . Cirrhosis Brother   . Colon cancer Neg Hx   . Stomach cancer Neg Hx     Social History Social History   Tobacco Use  . Smoking status: Former Smoker    Packs/day: 0.75    Years: 66.00    Pack years: 49.50    Quit date: 12/27/2019    Years since quitting: 0.1  . Smokeless tobacco: Never Used  Substance Use Topics  . Alcohol use: No    Alcohol/week: 0.0 standard drinks  . Drug use: No     Allergies   Patient has no known allergies.   Review of Systems Review of Systems  Reason unable to perform ROS: See HPI as above.     Physical Exam Triage Vital Signs ED Triage  Vitals  Enc Vitals Group     BP 02/23/20 1245 (!) 164/52     Pulse Rate 02/23/20 1245 73     Resp 02/23/20 1245 18     Temp 02/23/20 1245 98.4 F (36.9 C)     Temp Source 02/23/20 1245 Oral     SpO2 02/23/20 1245 94 %     Weight --      Height --      Head Circumference --      Peak Flow --      Pain Score 02/23/20 1246 0     Pain Loc --      Pain Edu? --      Excl. in [place]? --    No data found.  Updated Vital Signs BP (!) 164/52 (BP Location: Left Arm)   Pulse 73   Temp 98.4 F (36.9 C) (Oral)   Resp 18   LMP  (LMP Unknown)   SpO2 94%   Physical Exam Constitutional:      General: She is not in acute distress.    Appearance: Normal appearance. She is well-developed. She is not toxic-appearing or diaphoretic.  HENT:     Head: Normocephalic and atraumatic.  Eyes:     Conjunctiva/sclera: Conjunctivae normal.     Pupils: Pupils are equal, round, and reactive to light.  Pulmonary:     Effort: Pulmonary effort is normal. No respiratory distress.     Comments: Speaking in full sentences without difficulty Musculoskeletal:     Cervical back: Normal range of motion and neck supple.  Skin:    General: Skin is warm and dry.     Comments: See picture below. Opened blister without drainage. Small abrasion to the side. No erythema, warmth, pain.  Porta cath dressing clean. Dressing removed for examination. No drainage, swelling, erythema.   Neurological:     Mental Status: She is alert and oriented to person, place, and time.        UC Treatments / Results  Labs (all labs ordered are listed, but only abnormal results are displayed) Labs Reviewed - No data to display  EKG   Radiology   Procedures Procedures (including critical care time)  Medications Ordered in  UC Medications - No data to display  Initial Impression / Assessment and Plan / UC Course  I have reviewed the triage vital signs and the nursing notes.  Pertinent labs & imaging results that were  available during my care of the patient were reviewed by me and considered in my medical decision making (see chart for details).    Porta cath dressing changed. No signs of infection, drainage to the porta cath. No signs of infection. Will provide bactroban for the next few days. To avoid adhesive tape as worries for more skin tears. Strict return precautions given. Patient and daughter expresses understanding and agrees to plan.  Final Clinical Impressions(s) / UC Diagnoses   Final diagnoses:  Visit for wound check  Abrasion   ED Prescriptions    Medication Sig Dispense Auth. Provider   mupirocin ointment (BACTROBAN) 2 % Apply 1 application topically 2 (two) times daily. 22 g Ok Edwards, PA-C     PDMP not reviewed this encounter.   Ok Edwards, PA-C 02/23/20 1322

## 2020-02-23 NOTE — Discharge Instructions (Signed)
No signs of infection. Bactroban as directed. Keep wound clean and dry. Avoid soaking area. Monitor for spreading redness, warmth, fever, follow up for reevaluation.

## 2020-02-29 NOTE — Progress Notes (Signed)
[facility] OFFICE PROGRESS NOTE  [name], Robin G, MD [place] [address]  DIAGNOSIS: Extensive stage (T2 a, N2, M1 C) small cell lung cancer presented with central right upper lobe lung mass in addition to left lower lobe pulmonary metastasis and bilateral hilar, subcarinal and bilateral paratracheal and left prevascular lymphadenopathy in addition to bone metastasis in the left iliac wing diagnosed in April 2021.  PRIOR THERAPY: None  CURRENT THERAPY: Systemic chemotherapy with carboplatin for AUC of 5 on day 1, etoposide 100 mg/M2 on days 1, 2 and 3 with Neulasta support in addition to Imfinzi 1500 mg IV every 3 weeks during chemotherapy followed by maintenance Imfinzi 1500 mg IV every 4 weeks after cycle #4.  Status post 1 cycle.  INTERVAL HISTORY: [name] 85 y.o. female returns to the clinic for a follow up visit accompanied by her daughter. The patient is feeling well today without any concerning complaints. She recently had a port-a-cath placed. She went to the ER twice for a wound check and skin tear near her port. She denies any erythema, drainage, or pain. The patient tolerated her first cycle of treatment with chemotherapy/immunotherapy well without any adverse effects except for mild nausea. Denies any fever, chills, night sweats, or weight loss. Denies any chest pain, shortness of breath, cough, or hemoptysis. Denies any vomiting, or constipation. The patient has a history of chronic diarrhea after she eats. She actually states that her treatments made her bowel movements more regular for the last few weeks. Starting Friday, he bowel movements have went back to her baseline diarrhea for which she takes imodium. She denies any blood in her stool. She states she has about 1-2 loose stools daily as her baseline.  Denies any headache or visual changes. Denies any rashes or skin changes. The patient is here today for evaluation prior to starting cycle #  2  MEDICAL HISTORY: Past Medical History:  Diagnosis Date  . CAD (coronary artery disease), native coronary artery    PTCA of distal right coronary artery 1997 Cypher stents to circumflex 2005 Cardiac cath in 2011 with patent stent to circumflex and moderate disease elsewhere treated medically   . Cancer ([facility])   . Cardiac pacemaker in situ 12/24/2015   Original implant reportedly in 1991 for tachybradycardia syndrome, generator change in 1999, 2004 and evidently again in 2016 in [place]   . COPD (chronic obstructive pulmonary disease) ([facility])   . Diabetes mellitus without complication ([place])   . Hypertension   . Hypothyroidism 03/23/2010  . Pacemaker   . Paroxysmal atrial fibrillation ([facility]) 03/23/2010   CHA2DS2VASC score 5     ALLERGIES:  has No Known Allergies.  MEDICATIONS:  Current Outpatient Medications  Medication Sig Dispense Refill  . acetaminophen (TYLENOL) 500 MG tablet Take 500 mg by mouth every 6 (six) hours as needed for headache.    . albuterol (VENTOLIN HFA) 108 (90 Base) MCG/ACT inhaler TAKE 2 PUFFS BY MOUTH EVERY 6 HOURS AS NEEDED FOR WHEEZE OR SHORTNESS OF BREATH*NOT COVERED* (Patient taking differently: Inhale 2 puffs into the lungs every 6 (six) hours as needed for wheezing or shortness of breath. ) 8.5 [name] 1  . atorvastatin (LIPITOR) 10 MG tablet TAKE 1 TABLET BY MOUTH EVERY DAY 90 tablet 3  . carvedilol (COREG) 12.5 MG tablet TAKE 1 TABLET (12.5 MG TOTAL) BY MOUTH 2 (TWO) TIMES DAILY WITH A MEAL. 180 tablet 2  . [employer] NICOTINE TRANSDERMAL SYS 14 MG/24HR patch PLACE  1 PATCH (14 MG TOTAL) ONTO THE SKIN DAILY. 28 patch 0  . diclofenac sodium (VOLTAREN) 1 % GEL Apply 4 [name] topically 4 (four) times daily. (Patient taking differently: Apply 4 [name] topically 4 (four) times daily as needed (pain). ) 4 Tube 3  . dicyclomine (BENTYL) 10 MG capsule TAKE 1 CAPSULE (10 MG TOTAL) BY MOUTH 4 (FOUR) TIMES DAILY - BEFORE MEALS AND AT BEDTIME. 90 capsule 0  . fenofibrate (TRICOR) 48 MG tablet  TAKE 1 TABLET BY MOUTH EVERY DAY (Patient taking differently: Take 48 mg by mouth daily after lunch. ) 90 tablet 3  . glucose blood (ONETOUCH VERIO) test strip Use to test 3-4 times daily. 300 strip 4  . levothyroxine (SYNTHROID) 112 MCG tablet TAKE 1 TABLET (112 MCG TOTAL) BY MOUTH DAILY BEFORE BREAKFAST. 90 tablet 2  . linagliptin (TRADJENTA) 5 MG TABS tablet TAKE 1 TABLET (5 MG TOTAL) DAILY BY MOUTH. (Patient taking differently: Take 5 mg by mouth daily after breakfast. ) 90 tablet 1  . losartan (COZAAR) 50 MG tablet Take 2 tablets by mouth daily. (Patient taking differently: Take 100 mg by mouth daily. ) 180 tablet 1  . mupirocin ointment (BACTROBAN) 2 % Apply 1 application topically 2 (two) times daily. 22 [name] 0  . nitroGLYCERIN (NITROSTAT) 0.4 MG SL tablet Place 1 tablet (0.4 mg total) under the tongue every 5 (five) minutes x 3 doses as needed for chest pain (Max 3 doses within 15 min. Call 911). 10 tablet 0  . oxyCODONE-acetaminophen (PERCOCET/ROXICET) 5-325 MG tablet Take 1 tablet by mouth every 8 (eight) hours as needed for severe pain. 20 tablet 0  . pantoprazole (PROTONIX) 40 MG tablet TAKE 1 TABLET BY MOUTH EVERY DAY 90 tablet 1  . prochlorperazine (COMPAZINE) 10 MG tablet Take 1 tablet (10 mg total) by mouth every 6 (six) hours as needed for nausea or vomiting. 30 tablet 1  . XARELTO 15 MG TABS tablet TAKE 1 TABLET BY MOUTH DAILY WITH SUPPER. (Patient taking differently: Take 15 mg by mouth daily with supper. ) 30 tablet 3  . potassium chloride SA (KLOR-CON) 20 MEQ tablet 1 tab bid x 2 then 1 tab daily (Patient not taking: Reported on 02/01/2020) 30 tablet 3   No current facility-administered medications for this visit.   Facility-Administered Medications Ordered in Other Visits  Medication Dose Route Frequency Provider Last Rate Last Admin  . CARBOplatin (PARAPLATIN) 290 mg in sodium chloride 0.9 % 250 mL chemo infusion  290 mg Intravenous Once Curt Bears, MD      . dexamethasone  (DECADRON) 10 mg in sodium chloride 0.9 % 50 mL IVPB  10 mg Intravenous Once Curt Bears, MD 204 mL/hr at 03/01/20 1228 10 mg at 03/01/20 1228  . durvalumab (IMFINZI) 1,500 mg in sodium chloride 0.9 % 100 mL chemo infusion  1,500 mg Intravenous Once Curt Bears, MD      . etoposide (VEPESID) 150 mg in sodium chloride 0.9 % 500 mL chemo infusion  100 mg/m2 (Treatment Plan Recorded) Intravenous Once Curt Bears, MD      . fosaprepitant (EMEND) 150 mg in sodium chloride 0.9 % 145 mL IVPB  150 mg Intravenous Once Curt Bears, MD      . heparin lock flush 100 unit/mL  500 Units Intracatheter Once PRN Curt Bears, MD      . sodium chloride flush (NS) 0.9 % injection 10 mL  10 mL Intracatheter PRN Curt Bears, MD  SURGICAL HISTORY:  Past Surgical History:  Procedure Laterality Date  . BIOPSY  01/08/2020   Procedure: BIOPSY;  Surgeon: Garner Nash, DO;  Location: [facility];  Service: Pulmonary;;  . BRONCHIAL BRUSHINGS  01/08/2020   Procedure: BRONCHIAL BRUSHINGS;  Surgeon: Garner Nash, DO;  Location: [facility];  Service: Pulmonary;;  . BRONCHIAL WASHINGS  01/08/2020   Procedure: BRONCHIAL WASHINGS;  Surgeon: Garner Nash, DO;  Location: [facility];  Service: Pulmonary;;  . CARDIAC CATHETERIZATION N/A 12/26/2015   Procedure: Left Heart Cath and Coronary Angiography;  Surgeon: Peter M [name], MD;  Location: [facility];  Service: Cardiovascular;  Laterality: N/A;  . CARDIOVERSION  05/2015  . CHOLECYSTECTOMY  2012  . CORONARY ANGIOPLASTY WITH STENT PLACEMENT  1990  . ENDOBRONCHIAL ULTRASOUND  01/08/2020   Procedure: ENDOBRONCHIAL ULTRASOUND;  Surgeon: Garner Nash, DO;  Location: [facility];  Service: Pulmonary;;  . FINE NEEDLE ASPIRATION BIOPSY  01/08/2020   Procedure: FINE NEEDLE ASPIRATION BIOPSY;  Surgeon: Garner Nash, DO;  Location: [facility];  Service: Pulmonary;;  . HERNIA REPAIR    . IR IMAGING GUIDED PORT INSERTION  02/18/2020   . PACEMAKER GENERATOR CHANGE  [id number], 2016  . PACEMAKER INSERTION  1991  . VIDEO BRONCHOSCOPY WITH ENDOBRONCHIAL ULTRASOUND N/A 01/08/2020   Procedure: VIDEO BRONCHOSCOPY;  Surgeon: Garner Nash, DO;  Location: [facility];  Service: Pulmonary;  Laterality: N/A;    REVIEW OF SYSTEMS:   Review of Systems  Constitutional: Negative for appetite change, chills, fatigue, fever and unexpected weight change.  HENT: Negative for mouth sores, nosebleeds, sore throat and trouble swallowing.   Eyes: Negative for eye problems and icterus.  Respiratory: Negative for cough, hemoptysis, shortness of breath and wheezing.   Cardiovascular: Negative for chest pain and leg swelling.  Gastrointestinal: Positive for chronic diarrhea. Negative for abdominal pain, constipation, nausea and vomiting.  Genitourinary: Negative for bladder incontinence, difficulty urinating, dysuria, frequency and hematuria.   Musculoskeletal: Negative for back pain, gait problem, neck pain and neck stiffness.  Skin: Negative for itching and rash.  Neurological: Negative for dizziness, extremity weakness, gait problem, headaches, light-headedness and seizures.  Hematological: Negative for adenopathy. Does not bruise/bleed easily.  Psychiatric/Behavioral: Negative for confusion, depression and sleep disturbance. The patient is not nervous/anxious.     PHYSICAL EXAMINATION:  Blood pressure (!) 139/54, pulse 71, temperature (!) 97.5 F (36.4 C), temperature source Temporal, resp. rate 18, height 5\' 2"  (1.575 m), weight 115 lb 14.4 oz (52.6 kg), SpO2 100 %.  ECOG PERFORMANCE STATUS: 1 - Symptomatic but completely ambulatory  Physical Exam  Constitutional: Oriented to person, place, and time and well-developed, well-nourished, and in no distress.  HENT:  Head: Normocephalic and atraumatic.  Mouth/Throat: Oropharynx is clear and moist. No oropharyngeal exudate.  Eyes: Conjunctivae are normal. Right eye exhibits no discharge.  Left eye exhibits no discharge. No scleral icterus.  Neck: Normal range of motion. Neck supple.  Cardiovascular: Normal rate, regular rhythm, normal heart sounds and intact distal pulses.   Pulmonary/Chest: Effort normal. Quiet breath sounds in all lung fields. No respiratory distress. No wheezes. No rales.  Abdominal: Soft. Bowel sounds are normal. Exhibits no distension and no mass. There is no tenderness.  Musculoskeletal: Normal range of motion. Exhibits no edema.  Lymphadenopathy:    No cervical adenopathy.  Neurological: Alert and oriented to person, place, and time. Exhibits normal muscle tone. Gait normal. Coordination normal.  Skin: Skin is warm and dry. No rash noted. Not  diaphoretic. No erythema. No pallor.  Psychiatric: Mood, memory and judgment normal.  Vitals reviewed.  LABORATORY DATA: Lab Results  Component Value Date   WBC 9.7 03/01/2020   HGB 9.0 (L) 03/01/2020   HCT 28.7 (L) 03/01/2020   MCV 89.7 03/01/2020   PLT 160 03/01/2020      Chemistry      Component Value Date/Time   NA 142 03/01/2020 1021   K 3.5 03/01/2020 1021   CL 109 03/01/2020 1021   CO2 24 03/01/2020 1021   BUN 11 03/01/2020 1021   CREATININE 0.76 03/01/2020 1021      Component Value Date/Time   CALCIUM 9.0 03/01/2020 1021   ALKPHOS 72 03/01/2020 1021   AST 11 (L) 03/01/2020 1021   ALT 16 03/01/2020 1021   BILITOT 0.3 03/01/2020 1021       RADIOGRAPHIC STUDIES:  DG Chest 2 View  Result Date: 02/21/2020 CLINICAL DATA:  Chest pain.  Back pain. EXAM: CHEST - 2 VIEW COMPARISON:  January 05, 2020 FINDINGS: Stable pacemaker. The heart, hila, and mediastinum are stable. A left Port-A-Cath terminates in the central SVC. No pneumothorax. Mild bibasilar atelectasis. No suspicious infiltrates. No overt edema. No nodules or masses. IMPRESSION: No acute abnormalities are identified. Electronically Signed   By: Dorise Bullion III M.D   On: 02/21/2020 14:54   CT Angio Chest PE W/Cm &/Or Wo  Cm  Result Date: 02/21/2020 CLINICAL DATA:  Chest and back pain, right upper lobe lung cancer EXAM: CT ANGIOGRAPHY CHEST WITH CONTRAST TECHNIQUE: Multidetector CT imaging of the chest was performed using the standard protocol during bolus administration of intravenous contrast. Multiplanar CT image reconstructions and MIPs were obtained to evaluate the vascular anatomy. CONTRAST:  8mL OMNIPAQUE IOHEXOL 350 MG/ML SOLN COMPARISON:  01/15/2020, 12/27/2019 FINDINGS: Cardiovascular: This is a technically adequate evaluation of the pulmonary vasculature. There are no filling defects or pulmonary emboli. The heart is enlarged but stable. Pacemaker unchanged. Stable atherosclerosis of the aorta and coronary vasculature. Mediastinum/Nodes: Diffuse adenopathy throughout the mediastinum and bilateral hilar regions. Index lymph node within the anterior mediastinum reference image 45/5 measures 18 x 28 mm, unchanged since recent PET scan. The thyroid, trachea, and esophagus are grossly unremarkable. Lungs/Pleura: The spiculated right upper lobe mass seen previously now measures 2.5 x 2.2 cm reference image 51/5. On prior PET scan, this had measured approximately 2.6 x 3.2 cm. The left lower lobe mass measures 2.7 x 1.8 cm, unchanged since PET scan. No new pulmonary nodules or masses. Severe upper lobe predominant emphysema again seen. No new airspace disease, effusion, or pneumothorax. The central airways are patent. Upper Abdomen: No acute abnormality. Musculoskeletal: There are no acute or destructive bony lesions. Reconstructed images demonstrate no additional findings. Review of the MIP images confirms the above findings. IMPRESSION: 1. No evidence of pulmonary embolus. 2. Spiculated right upper lobe mass consistent with known lung cancer. Slight decreased size since prior study. 3. Stable left lower lobe mass and mediastinal/hilar adenopathy, consistent with intrathoracic metastases. 4. Aortic Atherosclerosis  (ICD10-I70.0) and Emphysema (ICD10-J43.9). Electronically Signed   By: Randa Ngo M.D.   On: 02/21/2020 19:24   CUP PACEART REMOTE DEVICE CHECK  Result Date: 02/10/2020 Scheduled remote reviewed. Normal device function.  8% AF burden. Known PAF, takes Xarelto. Next remote 91 days. Felisa Bonier, RN, MSNMay [phone], 2021 16:15 - Yellow Alert - Atrial Arrhythmia Burden of at least 6.0 hours in a 24 hour period.  IR IMAGING GUIDED PORT INSERTION  Result Date: 02/18/2020  INDICATION: 85 year old female with right upper lobe bronchogenic carcinoma. She presents for port catheter placement for durable venous access. She has a right subclavian approach port catheter, therefore we will proceed with placement of a left-sided port catheter. EXAM: IMPLANTED PORT A CATH PLACEMENT WITH ULTRASOUND AND FLUOROSCOPIC GUIDANCE MEDICATIONS: 2 [name] Ancef; The antibiotic was administered within an appropriate time interval prior to skin puncture. ANESTHESIA/SEDATION: Versed 2 mg IV; Fentanyl 100 mcg IV; Moderate Sedation Time:  35 minutes The patient was continuously monitored during the procedure by the interventional radiology nurse under my direct supervision. FLUOROSCOPY TIME:  6 minutes, 12 seconds (80 mGy) COMPLICATIONS: None immediate. PROCEDURE: The left neck and chest was prepped with chlorhexidine, and draped in the usual sterile fashion using maximum barrier technique (cap and mask, sterile gown, sterile gloves, large sterile sheet, hand hygiene and cutaneous antiseptic). Local anesthesia was attained by infiltration with 1% lidocaine with epinephrine. Ultrasound demonstrated patency of the left internal jugular vein vein, and this was documented with an image. Under real-time ultrasound guidance, this vein was accessed with a 21 gauge micropuncture needle and image documentation was performed. A small dermatotomy was made at the access site with an 11 scalpel. A 0.018" wire was advanced into the SVC and the access needle  exchanged for a 31F micropuncture vascular sheath. The 0.018" wire was then removed and a 0.035" wire advanced into the IVC. The venous anatomy is somewhat tortuous and a venogram was performed to identify the path. An appropriate location for the subcutaneous reservoir was selected below the clavicle and an incision was made through the skin and underlying soft tissues. The subcutaneous tissues were then dissected using a combination of blunt and sharp surgical technique and a pocket was formed. A single lumen low-profile power injectable portacatheter was then tunneled through the subcutaneous tissues from the pocket to the dermatotomy and the port reservoir placed within the subcutaneous pocket. The venous access site was then serially dilated and a peel away vascular sheath placed over the wire. The wire was removed and the port catheter advanced into position under fluoroscopic guidance. The catheter tip is positioned in the superior cavoatrial junction. This was documented with a spot image. The portacatheter was then tested and found to flush and aspirate well. The port was flushed with saline followed by 100 units/mL heparinized saline. The pocket was then closed in two layers using first subdermal inverted interrupted absorbable sutures followed by a running subcuticular suture. The epidermis was then sealed with Dermabond. The dermatotomy at the venous access site was also closed with Dermabond. IMPRESSION: Successful placement of a left IJ approach Power Port with ultrasound and fluoroscopic guidance. The catheter is ready for use. Electronically Signed   By: Jacqulynn Cadet M.D.   On: 02/18/2020 16:25     ASSESSMENT/PLAN:  This is a very pleasant 85 year old [place] American female recently diagnosed with extensive stage small cell lung cancer. She presented with a central right upper lobe lung mass in addition to left lower lobe nodule and mediastinal lymphadenopathy and a metastatic bone lesion  in the left iliac wing. She was diagnosed in April 2021.   The patient is currently undergoing treatment with palliative systemic chemotherapy with carboplatin for an AUC of 5 on day 1, etoposide 100 mg/m2 on days 1, 2, and 3 and Imfinzi 1500 mg every 3 weeks. She is status post 1 cycle.    Labs were reviewed. Recommend that she proceed with cycle #2 today as scheduled. I  will make sure she has weekly labs and flush appointments added to her scheduled.   I will arrange for a restaging CT scan to be performed. Since she recently had a CT angiogram of the chest, I will order just a CT of the abdomen and pelvis.   We will see her back for a follow up visit in 3 weeks for evaluation before starting cycle #3.   The patient was advised to call immediately if she has any concerning symptoms in the interval. The patient voices understanding of current disease status and treatment options and is in agreement with the current care plan. All questions were answered. The patient knows to call the clinic with any problems, questions or concerns. We can certainly see the patient much sooner if necessary      Orders Placed This Encounter  Procedures  . CT Abdomen Pelvis W Contrast    Standing Status:   Future    Standing Expiration Date:   03/01/2021    Order Specific Question:   ** REASON FOR EXAM (FREE TEXT)    Answer:   Restaging Lung Cancer    Order Specific Question:   If indicated for the ordered procedure, I authorize the administration of contrast media per Radiology protocol    Answer:   Yes    Order Specific Question:   Preferred imaging location?    Answer:   [facility]    Order Specific Question:   Is Oral Contrast requested for this exam?    Answer:   Yes, Per Radiology protocol    Order Specific Question:   Radiology Contrast Protocol - do NOT remove file path    Answer:   [email or web address]     [place], PA-C 03/01/20

## 2020-03-01 ENCOUNTER — Inpatient Hospital Stay: Payer: Medicare Other | Attending: Internal Medicine

## 2020-03-01 ENCOUNTER — Encounter: Payer: Self-pay | Admitting: Internal Medicine

## 2020-03-01 ENCOUNTER — Other Ambulatory Visit: Payer: Self-pay

## 2020-03-01 ENCOUNTER — Inpatient Hospital Stay (HOSPITAL_BASED_OUTPATIENT_CLINIC_OR_DEPARTMENT_OTHER): Payer: Medicare Other | Admitting: Physician Assistant

## 2020-03-01 ENCOUNTER — Inpatient Hospital Stay: Payer: Medicare Other

## 2020-03-01 VITALS — BP 139/54 | HR 71 | Temp 97.5°F | Resp 18 | Ht 62.0 in | Wt 115.9 lb

## 2020-03-01 DIAGNOSIS — Z5112 Encounter for antineoplastic immunotherapy: Secondary | ICD-10-CM | POA: Insufficient documentation

## 2020-03-01 DIAGNOSIS — E039 Hypothyroidism, unspecified: Secondary | ICD-10-CM | POA: Insufficient documentation

## 2020-03-01 DIAGNOSIS — C3411 Malignant neoplasm of upper lobe, right bronchus or lung: Secondary | ICD-10-CM | POA: Insufficient documentation

## 2020-03-01 DIAGNOSIS — Z79899 Other long term (current) drug therapy: Secondary | ICD-10-CM | POA: Diagnosis not present

## 2020-03-01 DIAGNOSIS — Z5189 Encounter for other specified aftercare: Secondary | ICD-10-CM | POA: Insufficient documentation

## 2020-03-01 DIAGNOSIS — C7951 Secondary malignant neoplasm of bone: Secondary | ICD-10-CM | POA: Diagnosis not present

## 2020-03-01 DIAGNOSIS — Z5111 Encounter for antineoplastic chemotherapy: Secondary | ICD-10-CM | POA: Insufficient documentation

## 2020-03-01 DIAGNOSIS — Z452 Encounter for adjustment and management of vascular access device: Secondary | ICD-10-CM | POA: Diagnosis not present

## 2020-03-01 DIAGNOSIS — C3491 Malignant neoplasm of unspecified part of right bronchus or lung: Secondary | ICD-10-CM

## 2020-03-01 DIAGNOSIS — C7802 Secondary malignant neoplasm of left lung: Secondary | ICD-10-CM | POA: Diagnosis not present

## 2020-03-01 LAB — CBC WITH DIFFERENTIAL (CANCER CENTER ONLY)
Abs Immature Granulocytes: 0.21 10*3/uL — ABNORMAL HIGH (ref 0.00–0.07)
Basophils Absolute: 0.1 10*3/uL (ref 0.0–0.1)
Basophils Relative: 1 %
Eosinophils Absolute: 0.1 10*3/uL (ref 0.0–0.5)
Eosinophils Relative: 1 %
HCT: 28.7 % — ABNORMAL LOW (ref 36.0–46.0)
Hemoglobin: 9 g/dL — ABNORMAL LOW (ref 12.0–15.0)
Immature Granulocytes: 2 %
Lymphocytes Relative: 19 %
Lymphs Abs: 1.8 10*3/uL (ref 0.7–4.0)
MCH: 28.1 pg (ref 26.0–34.0)
MCHC: 31.4 g/dL (ref 30.0–36.0)
MCV: 89.7 fL (ref 80.0–100.0)
Monocytes Absolute: 0.9 10*3/uL (ref 0.1–1.0)
Monocytes Relative: 9 %
Neutro Abs: 6.7 10*3/uL (ref 1.7–7.7)
Neutrophils Relative %: 68 %
Platelet Count: 160 10*3/uL (ref 150–400)
RBC: 3.2 MIL/uL — ABNORMAL LOW (ref 3.87–5.11)
RDW: 16.5 % — ABNORMAL HIGH (ref 11.5–15.5)
WBC Count: 9.7 10*3/uL (ref 4.0–10.5)
nRBC: 0.2 % (ref 0.0–0.2)

## 2020-03-01 LAB — CMP (CANCER CENTER ONLY)
ALT: 16 U/L (ref 0–44)
AST: 11 U/L — ABNORMAL LOW (ref 15–41)
Albumin: 3.2 g/dL — ABNORMAL LOW (ref 3.5–5.0)
Alkaline Phosphatase: 72 U/L (ref 38–126)
Anion gap: 9 (ref 5–15)
BUN: 11 mg/dL (ref 8–23)
CO2: 24 mmol/L (ref 22–32)
Calcium: 9 mg/dL (ref 8.9–10.3)
Chloride: 109 mmol/L (ref 98–111)
Creatinine: 0.76 mg/dL (ref 0.44–1.00)
GFR, Est AFR Am: >60 mL/min (ref >60)
GFR, Estimated: >60 mL/min (ref >60)
Glucose, Bld: 93 mg/dL (ref 70–99)
Potassium: 3.5 mmol/L (ref 3.5–5.1)
Sodium: 142 mmol/L (ref 135–145)
Total Bilirubin: 0.3 mg/dL (ref 0.3–1.2)
Total Protein: 6.4 g/dL — ABNORMAL LOW (ref 6.5–8.1)

## 2020-03-01 LAB — TSH: TSH: 4.724 u[IU]/mL — ABNORMAL HIGH (ref 0.350–4.500)

## 2020-03-01 MED ORDER — PALONOSETRON HCL INJECTION 0.25 MG/5ML
0.2500 mg | Freq: Once | INTRAVENOUS | Status: AC
  Administered 2020-03-01: 0.25 mg via INTRAVENOUS

## 2020-03-01 MED ORDER — SODIUM CHLORIDE 0.9% FLUSH
10.0000 mL | INTRAVENOUS | Status: DC | PRN
  Administered 2020-03-01: 10 mL
  Filled 2020-03-01: qty 10

## 2020-03-01 MED ORDER — SODIUM CHLORIDE 0.9 % IV SOLN
Freq: Once | INTRAVENOUS | Status: AC
  Filled 2020-03-01: qty 250

## 2020-03-01 MED ORDER — SODIUM CHLORIDE 0.9 % IV SOLN
150.0000 mg | Freq: Once | INTRAVENOUS | Status: AC
  Administered 2020-03-01: 150 mg via INTRAVENOUS
  Filled 2020-03-01: qty 150

## 2020-03-01 MED ORDER — HEPARIN SOD (PORK) LOCK FLUSH 100 UNIT/ML IV SOLN
500.0000 [IU] | Freq: Once | INTRAVENOUS | Status: AC | PRN
  Administered 2020-03-01: 500 [IU]
  Filled 2020-03-01: qty 5

## 2020-03-01 MED ORDER — PALONOSETRON HCL INJECTION 0.25 MG/5ML
INTRAVENOUS | Status: AC
  Filled 2020-03-01: qty 5

## 2020-03-01 MED ORDER — SODIUM CHLORIDE 0.9 % IV SOLN
10.0000 mg | Freq: Once | INTRAVENOUS | Status: AC
  Administered 2020-03-01: 10 mg via INTRAVENOUS
  Filled 2020-03-01: qty 10

## 2020-03-01 MED ORDER — SODIUM CHLORIDE 0.9 % IV SOLN
100.0000 mg/m2 | Freq: Once | INTRAVENOUS | Status: AC
  Administered 2020-03-01: 150 mg via INTRAVENOUS
  Filled 2020-03-01: qty 7.5

## 2020-03-01 MED ORDER — SODIUM CHLORIDE 0.9 % IV SOLN
1500.0000 mg | Freq: Once | INTRAVENOUS | Status: AC
  Administered 2020-03-01: 1500 mg via INTRAVENOUS
  Filled 2020-03-01: qty 30

## 2020-03-01 MED ORDER — SODIUM CHLORIDE 0.9 % IV SOLN
294.0000 mg | Freq: Once | INTRAVENOUS | Status: AC
  Administered 2020-03-01: 290 mg via INTRAVENOUS
  Filled 2020-03-01: qty 29

## 2020-03-01 NOTE — Progress Notes (Signed)
Received proof of income left via daughter Kalman Shan.  Went to infusion to discuss  approval for one-time $1000 grant to assist with personal expenses while going through treatment. Discussed expenses and how they are covered. She has a copy of the approval letter as well as the expense sheet along with the Outpatient pharmacy information.  She has my card for any additional financial questions or concerns.

## 2020-03-02 ENCOUNTER — Inpatient Hospital Stay: Payer: Medicare Other

## 2020-03-02 ENCOUNTER — Telehealth: Payer: Self-pay | Admitting: Family Medicine

## 2020-03-02 ENCOUNTER — Other Ambulatory Visit: Payer: Self-pay

## 2020-03-02 VITALS — BP 128/47 | HR 70 | Temp 98.3°F | Resp 18

## 2020-03-02 DIAGNOSIS — Z5112 Encounter for antineoplastic immunotherapy: Secondary | ICD-10-CM | POA: Diagnosis not present

## 2020-03-02 DIAGNOSIS — C7951 Secondary malignant neoplasm of bone: Secondary | ICD-10-CM | POA: Diagnosis not present

## 2020-03-02 DIAGNOSIS — E039 Hypothyroidism, unspecified: Secondary | ICD-10-CM | POA: Diagnosis not present

## 2020-03-02 DIAGNOSIS — Z452 Encounter for adjustment and management of vascular access device: Secondary | ICD-10-CM | POA: Diagnosis not present

## 2020-03-02 DIAGNOSIS — C3491 Malignant neoplasm of unspecified part of right bronchus or lung: Secondary | ICD-10-CM

## 2020-03-02 DIAGNOSIS — Z5189 Encounter for other specified aftercare: Secondary | ICD-10-CM | POA: Diagnosis not present

## 2020-03-02 DIAGNOSIS — C7802 Secondary malignant neoplasm of left lung: Secondary | ICD-10-CM | POA: Diagnosis not present

## 2020-03-02 DIAGNOSIS — Z79899 Other long term (current) drug therapy: Secondary | ICD-10-CM | POA: Diagnosis not present

## 2020-03-02 DIAGNOSIS — C3411 Malignant neoplasm of upper lobe, right bronchus or lung: Secondary | ICD-10-CM | POA: Diagnosis not present

## 2020-03-02 DIAGNOSIS — Z5111 Encounter for antineoplastic chemotherapy: Secondary | ICD-10-CM | POA: Diagnosis not present

## 2020-03-02 MED ORDER — SODIUM CHLORIDE 0.9 % IV SOLN
100.0000 mg/m2 | Freq: Once | INTRAVENOUS | Status: AC
  Administered 2020-03-02: 150 mg via INTRAVENOUS
  Filled 2020-03-02: qty 7.5

## 2020-03-02 MED ORDER — SODIUM CHLORIDE 0.9% FLUSH
10.0000 mL | INTRAVENOUS | Status: DC | PRN
  Administered 2020-03-02: 10 mL
  Filled 2020-03-02: qty 10

## 2020-03-02 MED ORDER — SODIUM CHLORIDE 0.9 % IV SOLN
10.0000 mg | Freq: Once | INTRAVENOUS | Status: AC
  Administered 2020-03-02: 10 mg via INTRAVENOUS
  Filled 2020-03-02: qty 10

## 2020-03-02 MED ORDER — SODIUM CHLORIDE 0.9 % IV SOLN
Freq: Once | INTRAVENOUS | Status: AC
  Filled 2020-03-02: qty 250

## 2020-03-02 MED ORDER — HEPARIN SOD (PORK) LOCK FLUSH 100 UNIT/ML IV SOLN
500.0000 [IU] | Freq: Once | INTRAVENOUS | Status: AC | PRN
  Administered 2020-03-02: 500 [IU]
  Filled 2020-03-02: qty 5

## 2020-03-02 NOTE — Progress Notes (Signed)
  Chronic Care Management   Note  03/02/2020 Name: [name] MRN: [id number] DOB: 04/19/1935  [name] is a 85 y.o. year old female who is a primary care patient of [name], Malka So, MD. I reached out to Elyse Hsu by phone today in response to a referral sent by [name] PCP, [name], [name], MD.   [name] was given information about Chronic Care Management services today including:  1. CCM service includes personalized support from designated clinical staff supervised by her physician, including individualized plan of care and coordination with other care providers 2. 24/7 contact phone numbers for assistance for urgent and routine care needs. 3. Service will only be billed when office clinical staff spend 20 minutes or more in a month to coordinate care. 4. Only one practitioner may furnish and bill the service in a calendar month. 5. The patient may stop CCM services at any time (effective at the end of the month) by phone call to the office staff.   Patient agreed to services and verbal consent obtained.  This note is not being shared with the patient for the following reason: To respect privacy (The patient or proxy has requested that the information not be shared).   Follow up plan:  [place]

## 2020-03-02 NOTE — Patient Instructions (Signed)
[facility] Discharge Instructions for Patients Receiving Chemotherapy  Today you received the following chemotherapy agents: Etoposide.  To help prevent nausea and vomiting after your treatment, we encourage you to take your nausea medication as directed.   If you develop nausea and vomiting that is not controlled by your nausea medication, call the clinic.   BELOW ARE SYMPTOMS THAT SHOULD BE REPORTED IMMEDIATELY:  *FEVER GREATER THAN 100.5 F  *CHILLS WITH OR WITHOUT FEVER  NAUSEA AND VOMITING THAT IS NOT CONTROLLED WITH YOUR NAUSEA MEDICATION  *UNUSUAL SHORTNESS OF BREATH  *UNUSUAL BRUISING OR BLEEDING  TENDERNESS IN MOUTH AND THROAT WITH OR WITHOUT PRESENCE OF ULCERS  *URINARY PROBLEMS  *BOWEL PROBLEMS  UNUSUAL RASH Items with * indicate a potential emergency and should be followed up as soon as possible.  Feel free to call the clinic should you have any questions or concerns. The clinic phone number is [phone].  Please show the [facility] at check-in to the Emergency Department and triage nurse.

## 2020-03-03 ENCOUNTER — Other Ambulatory Visit: Payer: Self-pay

## 2020-03-03 ENCOUNTER — Inpatient Hospital Stay: Payer: Medicare Other

## 2020-03-03 ENCOUNTER — Encounter: Payer: Self-pay | Admitting: Internal Medicine

## 2020-03-03 VITALS — BP 126/56 | HR 62 | Temp 98.1°F | Resp 18

## 2020-03-03 DIAGNOSIS — Z5189 Encounter for other specified aftercare: Secondary | ICD-10-CM | POA: Diagnosis not present

## 2020-03-03 DIAGNOSIS — C7802 Secondary malignant neoplasm of left lung: Secondary | ICD-10-CM | POA: Diagnosis not present

## 2020-03-03 DIAGNOSIS — E039 Hypothyroidism, unspecified: Secondary | ICD-10-CM | POA: Diagnosis not present

## 2020-03-03 DIAGNOSIS — Z5111 Encounter for antineoplastic chemotherapy: Secondary | ICD-10-CM | POA: Diagnosis not present

## 2020-03-03 DIAGNOSIS — Z79899 Other long term (current) drug therapy: Secondary | ICD-10-CM | POA: Diagnosis not present

## 2020-03-03 DIAGNOSIS — Z452 Encounter for adjustment and management of vascular access device: Secondary | ICD-10-CM | POA: Diagnosis not present

## 2020-03-03 DIAGNOSIS — C3411 Malignant neoplasm of upper lobe, right bronchus or lung: Secondary | ICD-10-CM | POA: Diagnosis not present

## 2020-03-03 DIAGNOSIS — C3491 Malignant neoplasm of unspecified part of right bronchus or lung: Secondary | ICD-10-CM

## 2020-03-03 DIAGNOSIS — Z5112 Encounter for antineoplastic immunotherapy: Secondary | ICD-10-CM | POA: Diagnosis not present

## 2020-03-03 DIAGNOSIS — C7951 Secondary malignant neoplasm of bone: Secondary | ICD-10-CM | POA: Diagnosis not present

## 2020-03-03 MED ORDER — SODIUM CHLORIDE 0.9 % IV SOLN
10.0000 mg | Freq: Once | INTRAVENOUS | Status: AC
  Administered 2020-03-03: 10 mg via INTRAVENOUS
  Filled 2020-03-03: qty 10

## 2020-03-03 MED ORDER — SODIUM CHLORIDE 0.9 % IV SOLN
Freq: Once | INTRAVENOUS | Status: AC
  Filled 2020-03-03: qty 250

## 2020-03-03 MED ORDER — HEPARIN SOD (PORK) LOCK FLUSH 100 UNIT/ML IV SOLN
500.0000 [IU] | Freq: Once | INTRAVENOUS | Status: AC | PRN
  Administered 2020-03-03: 500 [IU]
  Filled 2020-03-03: qty 5

## 2020-03-03 MED ORDER — SODIUM CHLORIDE 0.9% FLUSH
10.0000 mL | INTRAVENOUS | Status: DC | PRN
  Administered 2020-03-03: 10 mL
  Filled 2020-03-03: qty 10

## 2020-03-03 MED ORDER — SODIUM CHLORIDE 0.9 % IV SOLN
100.0000 mg/m2 | Freq: Once | INTRAVENOUS | Status: AC
  Administered 2020-03-03: 150 mg via INTRAVENOUS
  Filled 2020-03-03: qty 7.5

## 2020-03-03 NOTE — Patient Instructions (Signed)
[facility] Discharge Instructions for Patients Receiving Chemotherapy  Today you received the following chemotherapy agents: Etoposide.  To help prevent nausea and vomiting after your treatment, we encourage you to take your nausea medication as directed.   If you develop nausea and vomiting that is not controlled by your nausea medication, call the clinic.   BELOW ARE SYMPTOMS THAT SHOULD BE REPORTED IMMEDIATELY:  *FEVER GREATER THAN 100.5 F  *CHILLS WITH OR WITHOUT FEVER  NAUSEA AND VOMITING THAT IS NOT CONTROLLED WITH YOUR NAUSEA MEDICATION  *UNUSUAL SHORTNESS OF BREATH  *UNUSUAL BRUISING OR BLEEDING  TENDERNESS IN MOUTH AND THROAT WITH OR WITHOUT PRESENCE OF ULCERS  *URINARY PROBLEMS  *BOWEL PROBLEMS  UNUSUAL RASH Items with * indicate a potential emergency and should be followed up as soon as possible.  Feel free to call the clinic should you have any questions or concerns. The clinic phone number is [phone].  Please show the [facility] at check-in to the Emergency Department and triage nurse.

## 2020-03-03 NOTE — Progress Notes (Signed)
Patient inquiring about obtaining a walker with a seat on it. Staff message sent to RN and social workers for follow up.  She has my card for any additional financial questions or concerns.

## 2020-03-04 ENCOUNTER — Telehealth: Payer: Self-pay | Admitting: Medical Oncology

## 2020-03-04 NOTE — Telephone Encounter (Signed)
Pt requesting a wheelchair with a seat. She is receiving chemotherapy.

## 2020-03-05 ENCOUNTER — Other Ambulatory Visit: Payer: Self-pay

## 2020-03-05 ENCOUNTER — Inpatient Hospital Stay: Payer: Medicare Other

## 2020-03-05 VITALS — BP 143/62 | HR 73 | Temp 97.9°F | Resp 18

## 2020-03-05 DIAGNOSIS — C3411 Malignant neoplasm of upper lobe, right bronchus or lung: Secondary | ICD-10-CM | POA: Diagnosis not present

## 2020-03-05 DIAGNOSIS — E039 Hypothyroidism, unspecified: Secondary | ICD-10-CM | POA: Diagnosis not present

## 2020-03-05 DIAGNOSIS — Z5112 Encounter for antineoplastic immunotherapy: Secondary | ICD-10-CM | POA: Diagnosis not present

## 2020-03-05 DIAGNOSIS — Z5111 Encounter for antineoplastic chemotherapy: Secondary | ICD-10-CM | POA: Diagnosis not present

## 2020-03-05 DIAGNOSIS — C7802 Secondary malignant neoplasm of left lung: Secondary | ICD-10-CM | POA: Diagnosis not present

## 2020-03-05 DIAGNOSIS — C3491 Malignant neoplasm of unspecified part of right bronchus or lung: Secondary | ICD-10-CM

## 2020-03-05 DIAGNOSIS — Z79899 Other long term (current) drug therapy: Secondary | ICD-10-CM | POA: Diagnosis not present

## 2020-03-05 DIAGNOSIS — Z452 Encounter for adjustment and management of vascular access device: Secondary | ICD-10-CM | POA: Diagnosis not present

## 2020-03-05 DIAGNOSIS — Z5189 Encounter for other specified aftercare: Secondary | ICD-10-CM | POA: Diagnosis not present

## 2020-03-05 DIAGNOSIS — C7951 Secondary malignant neoplasm of bone: Secondary | ICD-10-CM | POA: Diagnosis not present

## 2020-03-05 MED ORDER — PEGFILGRASTIM-CBQV 6 MG/0.6ML ~~LOC~~ SOSY
6.0000 mg | PREFILLED_SYRINGE | Freq: Once | SUBCUTANEOUS | Status: AC
  Administered 2020-03-05: 6 mg via SUBCUTANEOUS

## 2020-03-05 NOTE — Patient Instructions (Signed)

## 2020-03-07 ENCOUNTER — Other Ambulatory Visit: Payer: Self-pay | Admitting: Family Medicine

## 2020-03-07 DIAGNOSIS — J42 Unspecified chronic bronchitis: Secondary | ICD-10-CM

## 2020-03-10 ENCOUNTER — Inpatient Hospital Stay: Payer: Medicare Other

## 2020-03-10 ENCOUNTER — Other Ambulatory Visit: Payer: Self-pay | Admitting: Family Medicine

## 2020-03-15 ENCOUNTER — Other Ambulatory Visit: Payer: Self-pay | Admitting: Family Medicine

## 2020-03-15 DIAGNOSIS — E876 Hypokalemia: Secondary | ICD-10-CM

## 2020-03-16 ENCOUNTER — Ambulatory Visit (HOSPITAL_COMMUNITY)
Admission: RE | Admit: 2020-03-16 | Discharge: 2020-03-16 | Disposition: A | Discharge status: Home / Self Care | Payer: Medicare Other | Source: Ambulatory Visit | Attending: Medical | Admitting: Medical

## 2020-03-16 ENCOUNTER — Other Ambulatory Visit: Payer: Self-pay

## 2020-03-16 ENCOUNTER — Inpatient Hospital Stay (HOSPITAL_BASED_OUTPATIENT_CLINIC_OR_DEPARTMENT_OTHER): Payer: Medicare Other | Admitting: Medical

## 2020-03-16 ENCOUNTER — Inpatient Hospital Stay: Payer: Medicare Other

## 2020-03-16 VITALS — BP 179/77 | HR 78 | Temp 97.7°F | Resp 18 | Ht 62.0 in | Wt 118.5 lb

## 2020-03-16 VITALS — BP 127/67 | HR 77 | Resp 22

## 2020-03-16 DIAGNOSIS — Z95828 Presence of other vascular implants and grafts: Secondary | ICD-10-CM

## 2020-03-16 DIAGNOSIS — R0602 Shortness of breath: Secondary | ICD-10-CM

## 2020-03-16 DIAGNOSIS — R0902 Hypoxemia: Secondary | ICD-10-CM | POA: Diagnosis not present

## 2020-03-16 DIAGNOSIS — C3490 Malignant neoplasm of unspecified part of unspecified bronchus or lung: Secondary | ICD-10-CM

## 2020-03-16 DIAGNOSIS — C3491 Malignant neoplasm of unspecified part of right bronchus or lung: Secondary | ICD-10-CM

## 2020-03-16 DIAGNOSIS — Z5189 Encounter for other specified aftercare: Secondary | ICD-10-CM | POA: Diagnosis not present

## 2020-03-16 DIAGNOSIS — Z5111 Encounter for antineoplastic chemotherapy: Secondary | ICD-10-CM | POA: Diagnosis not present

## 2020-03-16 DIAGNOSIS — Z5112 Encounter for antineoplastic immunotherapy: Secondary | ICD-10-CM | POA: Diagnosis not present

## 2020-03-16 DIAGNOSIS — C7802 Secondary malignant neoplasm of left lung: Secondary | ICD-10-CM | POA: Diagnosis not present

## 2020-03-16 DIAGNOSIS — E039 Hypothyroidism, unspecified: Secondary | ICD-10-CM | POA: Diagnosis not present

## 2020-03-16 DIAGNOSIS — C3411 Malignant neoplasm of upper lobe, right bronchus or lung: Secondary | ICD-10-CM | POA: Diagnosis not present

## 2020-03-16 DIAGNOSIS — Z79899 Other long term (current) drug therapy: Secondary | ICD-10-CM | POA: Diagnosis not present

## 2020-03-16 DIAGNOSIS — C7951 Secondary malignant neoplasm of bone: Secondary | ICD-10-CM | POA: Diagnosis not present

## 2020-03-16 DIAGNOSIS — Z452 Encounter for adjustment and management of vascular access device: Secondary | ICD-10-CM | POA: Diagnosis not present

## 2020-03-16 LAB — CBC WITH DIFFERENTIAL (CANCER CENTER ONLY)
Abs Immature Granulocytes: 3.84 10*3/uL — ABNORMAL HIGH (ref 0.00–0.07)
Basophils Absolute: 0.1 10*3/uL (ref 0.0–0.1)
Basophils Relative: 0 %
Eosinophils Absolute: 0.1 10*3/uL (ref 0.0–0.5)
Eosinophils Relative: 0 %
HCT: 26.1 % — ABNORMAL LOW (ref 36.0–46.0)
Hemoglobin: 8.3 g/dL — ABNORMAL LOW (ref 12.0–15.0)
Immature Granulocytes: 12 %
Lymphocytes Relative: 7 %
Lymphs Abs: 2.4 10*3/uL (ref 0.7–4.0)
MCH: 29.1 pg (ref 26.0–34.0)
MCHC: 31.8 g/dL (ref 30.0–36.0)
MCV: 91.6 fL (ref 80.0–100.0)
Monocytes Absolute: 1.7 10*3/uL — ABNORMAL HIGH (ref 0.1–1.0)
Monocytes Relative: 5 %
Neutro Abs: 24.7 10*3/uL — ABNORMAL HIGH (ref 1.7–7.7)
Neutrophils Relative %: 76 %
Platelet Count: 115 10*3/uL — ABNORMAL LOW (ref 150–400)
RBC: 2.85 MIL/uL — ABNORMAL LOW (ref 3.87–5.11)
RDW: 18.5 % — ABNORMAL HIGH (ref 11.5–15.5)
WBC Count: 32.9 10*3/uL — ABNORMAL HIGH (ref 4.0–10.5)
nRBC: 0.2 % (ref 0.0–0.2)

## 2020-03-16 LAB — CMP (CANCER CENTER ONLY)
ALT: 18 U/L (ref 0–44)
AST: 12 U/L — ABNORMAL LOW (ref 15–41)
Albumin: 3.5 g/dL (ref 3.5–5.0)
Alkaline Phosphatase: 127 U/L — ABNORMAL HIGH (ref 38–126)
Anion gap: 9 (ref 5–15)
BUN: 7 mg/dL — ABNORMAL LOW (ref 8–23)
CO2: 26 mmol/L (ref 22–32)
Calcium: 8.9 mg/dL (ref 8.9–10.3)
Chloride: 109 mmol/L (ref 98–111)
Creatinine: 0.65 mg/dL (ref 0.44–1.00)
GFR, Est AFR Am: >60 mL/min (ref >60)
GFR, Estimated: >60 mL/min (ref >60)
Glucose, Bld: 103 mg/dL — ABNORMAL HIGH (ref 70–99)
Potassium: 3.3 mmol/L — ABNORMAL LOW (ref 3.5–5.1)
Sodium: 144 mmol/L (ref 135–145)
Total Bilirubin: 0.4 mg/dL (ref 0.3–1.2)
Total Protein: 6.6 g/dL (ref 6.5–8.1)

## 2020-03-16 MED ORDER — IOHEXOL 350 MG/ML SOLN
100.0000 mL | Freq: Once | INTRAVENOUS | Status: AC | PRN
  Administered 2020-03-16: 100 mL via INTRAVENOUS

## 2020-03-16 MED ORDER — SODIUM CHLORIDE 0.9% FLUSH
10.0000 mL | INTRAVENOUS | Status: DC | PRN
  Administered 2020-03-16: 10 mL via INTRAVENOUS
  Filled 2020-03-16: qty 10

## 2020-03-16 MED ORDER — HEPARIN SOD (PORK) LOCK FLUSH 100 UNIT/ML IV SOLN
500.0000 [IU] | Freq: Once | INTRAVENOUS | Status: AC
  Administered 2020-03-16: 500 [IU] via INTRAVENOUS
  Filled 2020-03-16: qty 5

## 2020-03-16 MED ORDER — PREDNISONE 10 MG PO TABS: ORAL_TABLET | ORAL | 0 refills | Status: DC

## 2020-03-16 MED ORDER — SODIUM CHLORIDE (PF) 0.9 % IJ SOLN
INTRAMUSCULAR | Status: AC
  Filled 2020-03-16: qty 50

## 2020-03-16 NOTE — Progress Notes (Signed)
Patient has a CC of SOB x 1 day. Her O2 today was 99 W/O O2. She requested to see a provider so I made [name] and [name] aware and she was added to the Symptom Management schedule.

## 2020-03-16 NOTE — Addendum Note (Signed)
Addended by: Harle Stanford on: 03/16/2020 04:21 PM   Modules accepted: Orders

## 2020-03-16 NOTE — Patient Instructions (Signed)

## 2020-03-16 NOTE — Progress Notes (Signed)
Pt waiting in [facility] for home oxygen therapy evaluation & CT Angio.  Able to eat/drink/ambulate to restroom w/out any issues.

## 2020-03-16 NOTE — Patient Instructions (Signed)

## 2020-03-16 NOTE — Progress Notes (Addendum)
Symptoms Management Clinic Progress Note   [name] [id number] June 24, 1935 85 y.o.  [name] is managed by Dr. Fanny Bien. Mohamed  Actively treated with chemotherapy/immunotherapy/hormonal therapy: yes  Current therapy: carboplatin, etoposide, and Imfinzi with Udenyca support  Last treated: 03/05/2020 (cycle 2, day 5)  Next scheduled appointment with provider: 03/21/2020  Assessment: Plan:    Hypoxia - Plan: For home use only DME oxygen  Shortness of breath - Plan: CT ANGIO CHEST PE W OR WO CONTRAST, For home use only DME oxygen  Malignant neoplasm of lung, unspecified laterality, unspecified part of lung (HCC)   Shortness of breath and hypoxia: The patient has been referred for CT angiogram.  Additionally, home oxygen has been requested.  Her oxygen saturation level is as noted below:  Oxygen Saturation:   Patient Saturation on room air at rest: 96 %  Patient Saturation on room air while ambulation: 72 %  Patient Saturation on 2 LPM of oxygen while ambulation: 99 %   Patient unable to increase oxygen by other means. A CT angiogram returned showing no evidence of a PE.  Patient was placed on a 5-day prednisone taper.  She will begin this tomorrow morning.   Extensive stage small cell lung cancer: The patient continues to be managed by Dr. Julien Nordmann and is status post cycle 2-day 5 of carboplatin, etoposide, and Imfinzi with Udenyca support.  This was completed on 03/05/2020.   Please see After Visit Summary for patient specific instructions.  Future Appointments  Date Time Provider [place]  03/16/2020  2:00 PM [facility] 2 [facility]   03/17/2020  9:00 AM [facility]  03/21/2020 10:15 AM [facility] 2 [facility] None  03/21/2020 10:45 AM Curt Bears, MD [facility] None  03/21/2020 11:45 AM [facility] INFUSION [facility] None  03/22/2020  9:30 AM [facility] INFUSION [facility] None  03/23/2020  9:30 AM [facility]  INFUSION [facility] None  03/25/2020 10:15 AM [facility] [place] FLUSH [facility] None  03/30/2020  9:00 AM [name], [name], MD [facility]  04/12/2020  2:00 PM [facility] PHARMACIST [facility]  05/11/2020  7:15 AM [facility] DEVICE REMOTES [facility]  08/10/2020  7:15 AM [facility] DEVICE REMOTES [facility]  11/09/2020  7:15 AM [facility] DEVICE REMOTES [facility]  02/08/2021  7:15 AM [facility] DEVICE REMOTES [facility]    Orders Placed This Encounter  Procedures  . For home use only DME oxygen  . CT ANGIO CHEST PE W OR WO CONTRAST       Subjective:   Patient ID:  [name] is a 85 y.o. (DOB 07-Jun-1935) female.  Chief Complaint:  Chief Complaint  Patient presents with  . Shortness of Breath    HPI [name] is a 85 y.o. female with a diagnosis of an extensive stage small cell lung cancer. She continues to be managed by Dr. Julien Nordmann and is status post cycle 2-day 5 of carboplatin, etoposide, and Imfinzi with Udenyca support.  This was completed on 03/05/2020.  She is pending restaging CT scans later this week.  She presents to the clinic today with acute shortness of breath which began yesterday.  She does not use home oxygen.  She reports that she was so short of breath last night that she would note difficulty with breathing when simply rearranging the sheets on her bed.  She reports a mild cough but no phlegm production.  She presented to have her port flushed today and was noted to have shortness of breath.  She  denies chest pain, fevers, chills, nausea, vomiting, or diarrhea.  Her oxygen saturation levels are as noted below:  Oxygen Saturation:   Patient Saturation on room air at rest: 96 %  Patient Saturation on room air while ambulation: 72 %  Patient Saturation on 2 LPM of oxygen while ambulation: 99 %   Medications: I have reviewed the patient's current medications.  Allergies: No Known Allergies  Past Medical  History:  Diagnosis Date  . CAD (coronary artery disease), native coronary artery    PTCA of distal right coronary artery 1997 Cypher stents to circumflex 2005 Cardiac cath in 2011 with patent stent to circumflex and moderate disease elsewhere treated medically   . Cancer ([place])   . Cardiac pacemaker in situ 12/24/2015   Original implant reportedly in 1991 for tachybradycardia syndrome, generator change in 1999, 2004 and evidently again in 2016 in [place]   . COPD (chronic obstructive pulmonary disease) ([place])   . Diabetes mellitus without complication ([place])   . Hypertension   . Hypothyroidism 03/23/2010  . Pacemaker   . Paroxysmal atrial fibrillation ([place]) 03/23/2010   CHA2DS2VASC score 5     Past Surgical History:  Procedure Laterality Date  . BIOPSY  01/08/2020   Procedure: BIOPSY;  Surgeon: Garner Nash, DO;  Location: [facility];  Service: Pulmonary;;  . BRONCHIAL BRUSHINGS  01/08/2020   Procedure: BRONCHIAL BRUSHINGS;  Surgeon: Garner Nash, DO;  Location: [place] [facility];  Service: Pulmonary;;  . BRONCHIAL WASHINGS  01/08/2020   Procedure: BRONCHIAL WASHINGS;  Surgeon: Garner Nash, DO;  Location: [place] [facility];  Service: Pulmonary;;  . CARDIAC CATHETERIZATION N/A 12/26/2015   Procedure: Left Heart Cath and Coronary Angiography;  Surgeon: Peter M [name], MD;  Location: [place] [facility];  Service: Cardiovascular;  Laterality: N/A;  . CARDIOVERSION  05/2015  . CHOLECYSTECTOMY  2012  . CORONARY ANGIOPLASTY WITH STENT PLACEMENT  1990  . ENDOBRONCHIAL ULTRASOUND  01/08/2020   Procedure: ENDOBRONCHIAL ULTRASOUND;  Surgeon: Garner Nash, DO;  Location: [place] [facility];  Service: Pulmonary;;  . FINE NEEDLE ASPIRATION BIOPSY  01/08/2020   Procedure: FINE NEEDLE ASPIRATION BIOPSY;  Surgeon: Garner Nash, DO;  Location: [place] [facility];  Service: Pulmonary;;  . HERNIA REPAIR    . IR IMAGING GUIDED PORT INSERTION  02/18/2020  . PACEMAKER GENERATOR CHANGE  [id number], 2016  . PACEMAKER  INSERTION  1991  . VIDEO BRONCHOSCOPY WITH ENDOBRONCHIAL ULTRASOUND N/A 01/08/2020   Procedure: VIDEO BRONCHOSCOPY;  Surgeon: Garner Nash, DO;  Location: [facility];  Service: Pulmonary;  Laterality: N/A;    Family History  Problem Relation Age of Onset  . Heart disease Father   . Heart disease Mother   . Heart attack Mother   . Cirrhosis Brother   . Colon cancer Neg Hx   . Stomach cancer Neg Hx     Social History   Socioeconomic History  . Marital status: Widowed    Spouse name: Not on file  . Number of children: Not on file  . Years of education: Not on file  . Highest education level: Not on file  Occupational History  . Occupation: retired  Tobacco Use  . Smoking status: Former Smoker    Packs/day: 0.75    Years: 66.00    Pack years: 49.50    Quit date: 12/27/2019    Years since quitting: 0.2  . Smokeless tobacco: Never Used  Vaping Use  . Vaping Use: Never used  Substance and Sexual Activity  .  Alcohol use: No    Alcohol/week: 0.0 standard drinks  . Drug use: No  . Sexual activity: Not on file  Other Topics Concern  . Not on file  Social History Narrative  . Not on file   Social Determinants of Health   Financial Resource Strain:   . Difficulty of Paying Living Expenses:   Food Insecurity:   . Worried About Charity fundraiser in the Last Year:   . Arboriculturist in the Last Year:   Transportation Needs:   . Film/video editor (Medical):   Marland Kitchen Lack of Transportation (Non-Medical):   Physical Activity:   . Days of Exercise per Week:   . Minutes of Exercise per Session:   Stress:   . Feeling of Stress :   Social Connections:   . Frequency of Communication with Friends and Family:   . Frequency of Social Gatherings with Friends and Family:   . Attends Religious Services:   . Active Member of Clubs or Organizations:   . Attends Archivist Meetings:   Marland Kitchen Marital Status:   Intimate Partner Violence:   . Fear of Current or Ex-Partner:    . Emotionally Abused:   Marland Kitchen Physically Abused:   . Sexually Abused:     Past Medical History, Surgical history, Social history, and Family history were reviewed and updated as appropriate.   Please see review of systems for further details on the patient's review from today.   Review of Systems:  Review of Systems  Constitutional: Negative for chills, diaphoresis and fever.  HENT: Negative for trouble swallowing.   Respiratory: Positive for shortness of breath. Negative for cough, choking, chest tightness, wheezing and stridor.   Cardiovascular: Negative for chest pain and palpitations.    Objective:   Physical Exam:  BP (!) 179/77 (BP Location: Left Arm, Patient Position: Sitting)   Pulse 78   Temp 97.7 F (36.5 C) (Temporal)   Resp 18   Ht 5\' 2"  (1.575 m)   Wt 118 lb 8 oz (53.8 kg)   LMP  (LMP Unknown)   SpO2 100%   BMI 21.67 kg/m  ECOG: 1  Physical Exam Constitutional:      General: She is not in acute distress.    Appearance: She is not diaphoretic.  HENT:     Head: Normocephalic and atraumatic.  Cardiovascular:     Rate and Rhythm: Normal rate and regular rhythm.     Heart sounds: Normal heart sounds. No murmur heard.  No friction rub. No gallop.   Pulmonary:     Effort: Pulmonary effort is normal. No respiratory distress.     Breath sounds: Normal breath sounds. No stridor. No wheezing or rales.     Comments: Bibasilar crackles are noted. Musculoskeletal:     Right lower leg: No edema.     Left lower leg: No edema.  Skin:    General: Skin is warm and dry.     Coloration: Skin is not pale.     Findings: No erythema.  Neurological:     Mental Status: She is alert.  Psychiatric:        Behavior: Behavior normal.        Thought Content: Thought content normal.        Judgment: Judgment normal.     Lab Review:     Component Value Date/Time   NA 144 03/16/2020 1017   K 3.3 (L) 03/16/2020 1017   CL 109 03/16/2020 1017  CO2 26 03/16/2020 1017    GLUCOSE 103 (H) 03/16/2020 1017   BUN 7 (L) 03/16/2020 1017   CREATININE 0.65 03/16/2020 1017   CALCIUM 8.9 03/16/2020 1017   PROT 6.6 03/16/2020 1017   ALBUMIN 3.5 03/16/2020 1017   AST 12 (L) 03/16/2020 1017   ALT 18 03/16/2020 1017   ALKPHOS 127 (H) 03/16/2020 1017   BILITOT 0.4 03/16/2020 1017   GFRNONAA >60 03/16/2020 1017   GFRAA >60 03/16/2020 1017       Component Value Date/Time   WBC 32.9 (H) 03/16/2020 1017   WBC 5.0 02/21/2020 1444   RBC 2.85 (L) 03/16/2020 1017   HGB 8.3 (L) 03/16/2020 1017   HCT 26.1 (L) 03/16/2020 1017   PLT 115 (L) 03/16/2020 1017   MCV 91.6 03/16/2020 1017   MCH 29.1 03/16/2020 1017   MCHC 31.8 03/16/2020 1017   RDW 18.5 (H) 03/16/2020 1017   LYMPHSABS 2.4 03/16/2020 1017   MONOABS 1.7 (H) 03/16/2020 1017   EOSABS 0.1 03/16/2020 1017   BASOSABS 0.1 03/16/2020 1017   -------------------------------  Imaging from last 24 hours (if applicable):  Radiology interpretation: DG Chest 2 View  Result Date: 02/21/2020 CLINICAL DATA:  Chest pain.  Back pain. EXAM: CHEST - 2 VIEW COMPARISON:  January 05, 2020 FINDINGS: Stable pacemaker. The heart, hila, and mediastinum are stable. A left Port-A-Cath terminates in the central SVC. No pneumothorax. Mild bibasilar atelectasis. No suspicious infiltrates. No overt edema. No nodules or masses. IMPRESSION: No acute abnormalities are identified. Electronically Signed   By: Dorise Bullion III M.D   On: 02/21/2020 14:54   CT Angio Chest PE W/Cm &/Or Wo Cm  Result Date: 02/21/2020 CLINICAL DATA:  Chest and back pain, right upper lobe lung cancer EXAM: CT ANGIOGRAPHY CHEST WITH CONTRAST TECHNIQUE: Multidetector CT imaging of the chest was performed using the standard protocol during bolus administration of intravenous contrast. Multiplanar CT image reconstructions and MIPs were obtained to evaluate the vascular anatomy. CONTRAST:  61mL OMNIPAQUE IOHEXOL 350 MG/ML SOLN COMPARISON:  01/15/2020, 12/27/2019 FINDINGS:  Cardiovascular: This is a technically adequate evaluation of the pulmonary vasculature. There are no filling defects or pulmonary emboli. The heart is enlarged but stable. Pacemaker unchanged. Stable atherosclerosis of the aorta and coronary vasculature. Mediastinum/Nodes: Diffuse adenopathy throughout the mediastinum and bilateral hilar regions. Index lymph node within the anterior mediastinum reference image 45/5 measures 18 x 28 mm, unchanged since recent PET scan. The thyroid, trachea, and esophagus are grossly unremarkable. Lungs/Pleura: The spiculated right upper lobe mass seen previously now measures 2.5 x 2.2 cm reference image 51/5. On prior PET scan, this had measured approximately 2.6 x 3.2 cm. The left lower lobe mass measures 2.7 x 1.8 cm, unchanged since PET scan. No new pulmonary nodules or masses. Severe upper lobe predominant emphysema again seen. No new airspace disease, effusion, or pneumothorax. The central airways are patent. Upper Abdomen: No acute abnormality. Musculoskeletal: There are no acute or destructive bony lesions. Reconstructed images demonstrate no additional findings. Review of the MIP images confirms the above findings. IMPRESSION: 1. No evidence of pulmonary embolus. 2. Spiculated right upper lobe mass consistent with known lung cancer. Slight decreased size since prior study. 3. Stable left lower lobe mass and mediastinal/hilar adenopathy, consistent with intrathoracic metastases. 4. Aortic Atherosclerosis (ICD10-I70.0) and Emphysema (ICD10-J43.9). Electronically Signed   By: Randa Ngo M.D.   On: 02/21/2020 19:24   IR IMAGING GUIDED PORT INSERTION  Result Date: 02/18/2020 INDICATION: 85 year old female with right upper lobe  bronchogenic carcinoma. She presents for port catheter placement for durable venous access. She has a right subclavian approach port catheter, therefore we will proceed with placement of a left-sided port catheter. EXAM: IMPLANTED PORT A CATH  PLACEMENT WITH ULTRASOUND AND FLUOROSCOPIC GUIDANCE MEDICATIONS: 2 g Ancef; The antibiotic was administered within an appropriate time interval prior to skin puncture. ANESTHESIA/SEDATION: Versed 2 mg IV; Fentanyl 100 mcg IV; Moderate Sedation Time:  35 minutes The patient was continuously monitored during the procedure by the interventional radiology nurse under my direct supervision. FLUOROSCOPY TIME:  6 minutes, 12 seconds (80 mGy) COMPLICATIONS: None immediate. PROCEDURE: The left neck and chest was prepped with chlorhexidine, and draped in the usual sterile fashion using maximum barrier technique (cap and mask, sterile gown, sterile gloves, large sterile sheet, hand hygiene and cutaneous antiseptic). Local anesthesia was attained by infiltration with 1% lidocaine with epinephrine. Ultrasound demonstrated patency of the left internal jugular vein vein, and this was documented with an image. Under real-time ultrasound guidance, this vein was accessed with a 21 gauge micropuncture needle and image documentation was performed. A small dermatotomy was made at the access site with an 11 scalpel. A 0.018" wire was advanced into the SVC and the access needle exchanged for a 47F micropuncture vascular sheath. The 0.018" wire was then removed and a 0.035" wire advanced into the IVC. The venous anatomy is somewhat tortuous and a venogram was performed to identify the path. An appropriate location for the subcutaneous reservoir was selected below the clavicle and an incision was made through the skin and underlying soft tissues. The subcutaneous tissues were then dissected using a combination of blunt and sharp surgical technique and a pocket was formed. A single lumen low-profile power injectable portacatheter was then tunneled through the subcutaneous tissues from the pocket to the dermatotomy and the port reservoir placed within the subcutaneous pocket. The venous access site was then serially dilated and a peel away  vascular sheath placed over the wire. The wire was removed and the port catheter advanced into position under fluoroscopic guidance. The catheter tip is positioned in the superior cavoatrial junction. This was documented with a spot image. The portacatheter was then tested and found to flush and aspirate well. The port was flushed with saline followed by 100 units/mL heparinized saline. The pocket was then closed in two layers using first subdermal inverted interrupted absorbable sutures followed by a running subcuticular suture. The epidermis was then sealed with Dermabond. The dermatotomy at the venous access site was also closed with Dermabond. IMPRESSION: Successful placement of a left IJ approach Power Port with ultrasound and fluoroscopic guidance. The catheter is ready for use. Electronically Signed   By: Jacqulynn Cadet M.D.   On: 02/18/2020 16:25        This case was discussed with Dr. Julien Nordmann. He expressed agreement with my management of this patient.

## 2020-03-17 ENCOUNTER — Other Ambulatory Visit: Payer: Medicare Other | Admitting: *Deleted

## 2020-03-17 DIAGNOSIS — I480 Paroxysmal atrial fibrillation: Secondary | ICD-10-CM

## 2020-03-17 LAB — BASIC METABOLIC PANEL
BUN/Creatinine Ratio: 9 — ABNORMAL LOW (ref 12–28)
BUN: 6 mg/dL — ABNORMAL LOW (ref 8–27)
CO2: 24 mmol/L (ref 20–29)
Calcium: 8.8 mg/dL (ref 8.7–10.3)
Chloride: 104 mmol/L (ref 96–106)
Creatinine, Ser: 0.65 mg/dL (ref 0.57–1.00)
GFR calc Af Amer: 96 mL/min/{1.73_m2} (ref >59)
GFR calc non Af Amer: 83 mL/min/{1.73_m2} (ref >59)
Glucose: 100 mg/dL — ABNORMAL HIGH (ref 65–99)
Potassium: 3.5 mmol/L (ref 3.5–5.2)
Sodium: 143 mmol/L (ref 134–144)

## 2020-03-17 LAB — CBC
Hematocrit: 27.1 % — ABNORMAL LOW (ref 34.0–46.6)
Hemoglobin: 8.7 g/dL — ABNORMAL LOW (ref 11.1–15.9)
MCH: 28.3 pg (ref 26.6–33.0)
MCHC: 32.1 g/dL (ref 31.5–35.7)
MCV: 88 fL (ref 79–97)
Platelets: 166 10*3/uL (ref 150–450)
RBC: 3.07 x10E6/uL — ABNORMAL LOW (ref 3.77–5.28)
RDW: 15.8 % — ABNORMAL HIGH (ref 11.7–15.4)
WBC: 24.8 10*3/uL (ref 3.4–10.8)

## 2020-03-18 ENCOUNTER — Ambulatory Visit (HOSPITAL_COMMUNITY)
Admission: RE | Admit: 2020-03-18 | Discharge: 2020-03-18 | Disposition: A | Discharge status: Home / Self Care | Payer: Medicare Other | Source: Ambulatory Visit | Attending: Physician Assistant | Admitting: Physician Assistant

## 2020-03-18 ENCOUNTER — Other Ambulatory Visit: Payer: Self-pay

## 2020-03-18 ENCOUNTER — Ambulatory Visit (HOSPITAL_COMMUNITY): Payer: Medicare Other

## 2020-03-18 DIAGNOSIS — C3491 Malignant neoplasm of unspecified part of right bronchus or lung: Secondary | ICD-10-CM | POA: Diagnosis not present

## 2020-03-18 DIAGNOSIS — C7951 Secondary malignant neoplasm of bone: Secondary | ICD-10-CM | POA: Diagnosis not present

## 2020-03-18 DIAGNOSIS — C3490 Malignant neoplasm of unspecified part of unspecified bronchus or lung: Secondary | ICD-10-CM | POA: Diagnosis not present

## 2020-03-18 MED ORDER — IOHEXOL 300 MG/ML  SOLN
100.0000 mL | Freq: Once | INTRAMUSCULAR | Status: AC | PRN
  Administered 2020-03-18: 100 mL via INTRAVENOUS

## 2020-03-18 MED ORDER — SODIUM CHLORIDE (PF) 0.9 % IJ SOLN
INTRAMUSCULAR | Status: AC
  Filled 2020-03-18: qty 50

## 2020-03-21 ENCOUNTER — Inpatient Hospital Stay: Payer: Medicare Other

## 2020-03-21 ENCOUNTER — Inpatient Hospital Stay (HOSPITAL_BASED_OUTPATIENT_CLINIC_OR_DEPARTMENT_OTHER): Payer: Medicare Other | Admitting: Internal Medicine

## 2020-03-21 ENCOUNTER — Encounter: Payer: Self-pay | Admitting: Internal Medicine

## 2020-03-21 ENCOUNTER — Other Ambulatory Visit: Payer: Self-pay

## 2020-03-21 ENCOUNTER — Other Ambulatory Visit: Payer: Self-pay | Admitting: Internal Medicine

## 2020-03-21 VITALS — BP 140/54 | HR 71 | Temp 97.2°F | Resp 20 | Ht 62.0 in | Wt 116.0 lb

## 2020-03-21 DIAGNOSIS — Z5112 Encounter for antineoplastic immunotherapy: Secondary | ICD-10-CM | POA: Diagnosis not present

## 2020-03-21 DIAGNOSIS — Z5189 Encounter for other specified aftercare: Secondary | ICD-10-CM | POA: Diagnosis not present

## 2020-03-21 DIAGNOSIS — Z452 Encounter for adjustment and management of vascular access device: Secondary | ICD-10-CM | POA: Diagnosis not present

## 2020-03-21 DIAGNOSIS — C3491 Malignant neoplasm of unspecified part of right bronchus or lung: Secondary | ICD-10-CM

## 2020-03-21 DIAGNOSIS — C7951 Secondary malignant neoplasm of bone: Secondary | ICD-10-CM | POA: Diagnosis not present

## 2020-03-21 DIAGNOSIS — Z79899 Other long term (current) drug therapy: Secondary | ICD-10-CM | POA: Diagnosis not present

## 2020-03-21 DIAGNOSIS — C3411 Malignant neoplasm of upper lobe, right bronchus or lung: Secondary | ICD-10-CM | POA: Diagnosis not present

## 2020-03-21 DIAGNOSIS — E039 Hypothyroidism, unspecified: Secondary | ICD-10-CM | POA: Diagnosis not present

## 2020-03-21 DIAGNOSIS — Z5111 Encounter for antineoplastic chemotherapy: Secondary | ICD-10-CM

## 2020-03-21 DIAGNOSIS — C7802 Secondary malignant neoplasm of left lung: Secondary | ICD-10-CM | POA: Diagnosis not present

## 2020-03-21 LAB — CBC WITH DIFFERENTIAL (CANCER CENTER ONLY)
Abs Immature Granulocytes: 0.64 10*3/uL — ABNORMAL HIGH (ref 0.00–0.07)
Basophils Absolute: 0.1 10*3/uL (ref 0.0–0.1)
Basophils Relative: 0 %
Eosinophils Absolute: 0.1 10*3/uL (ref 0.0–0.5)
Eosinophils Relative: 1 %
HCT: 25.2 % — ABNORMAL LOW (ref 36.0–46.0)
Hemoglobin: 8 g/dL — ABNORMAL LOW (ref 12.0–15.0)
Immature Granulocytes: 4 %
Lymphocytes Relative: 18 %
Lymphs Abs: 2.5 10*3/uL (ref 0.7–4.0)
MCH: 29.5 pg (ref 26.0–34.0)
MCHC: 31.7 g/dL (ref 30.0–36.0)
MCV: 93 fL (ref 80.0–100.0)
Monocytes Absolute: 1.2 10*3/uL — ABNORMAL HIGH (ref 0.1–1.0)
Monocytes Relative: 8 %
Neutro Abs: 9.9 10*3/uL — ABNORMAL HIGH (ref 1.7–7.7)
Neutrophils Relative %: 69 %
Platelet Count: 191 10*3/uL (ref 150–400)
RBC: 2.71 MIL/uL — ABNORMAL LOW (ref 3.87–5.11)
RDW: 18.7 % — ABNORMAL HIGH (ref 11.5–15.5)
WBC Count: 14.5 10*3/uL — ABNORMAL HIGH (ref 4.0–10.5)
nRBC: 0.6 % — ABNORMAL HIGH (ref 0.0–0.2)

## 2020-03-21 LAB — CMP (CANCER CENTER ONLY)
ALT: 27 U/L (ref 0–44)
AST: 16 U/L (ref 15–41)
Albumin: 3.2 g/dL — ABNORMAL LOW (ref 3.5–5.0)
Alkaline Phosphatase: 97 U/L (ref 38–126)
Anion gap: 7 (ref 5–15)
BUN: 13 mg/dL (ref 8–23)
CO2: 26 mmol/L (ref 22–32)
Calcium: 8.5 mg/dL — ABNORMAL LOW (ref 8.9–10.3)
Chloride: 109 mmol/L (ref 98–111)
Creatinine: 0.78 mg/dL (ref 0.44–1.00)
GFR, Est AFR Am: >60 mL/min (ref >60)
GFR, Estimated: >60 mL/min (ref >60)
Glucose, Bld: 101 mg/dL — ABNORMAL HIGH (ref 70–99)
Potassium: 4.1 mmol/L (ref 3.5–5.1)
Sodium: 142 mmol/L (ref 135–145)
Total Bilirubin: 0.5 mg/dL (ref 0.3–1.2)
Total Protein: 6 g/dL — ABNORMAL LOW (ref 6.5–8.1)

## 2020-03-21 LAB — TSH: TSH: 4.196 u[IU]/mL — ABNORMAL HIGH (ref 0.308–3.960)

## 2020-03-21 MED ORDER — PALONOSETRON HCL INJECTION 0.25 MG/5ML
0.2500 mg | Freq: Once | INTRAVENOUS | Status: AC
  Administered 2020-03-21: 0.25 mg via INTRAVENOUS

## 2020-03-21 MED ORDER — SODIUM CHLORIDE 0.9 % IV SOLN
150.0000 mg | Freq: Once | INTRAVENOUS | Status: AC
  Administered 2020-03-21: 150 mg via INTRAVENOUS
  Filled 2020-03-21: qty 150

## 2020-03-21 MED ORDER — SODIUM CHLORIDE 0.9 % IV SOLN
100.0000 mg/m2 | Freq: Once | INTRAVENOUS | Status: AC
  Administered 2020-03-21: 150 mg via INTRAVENOUS
  Filled 2020-03-21: qty 7.5

## 2020-03-21 MED ORDER — SODIUM CHLORIDE 0.9% FLUSH
10.0000 mL | INTRAVENOUS | Status: DC | PRN
  Administered 2020-03-21: 10 mL
  Filled 2020-03-21: qty 10

## 2020-03-21 MED ORDER — SODIUM CHLORIDE 0.9 % IV SOLN
10.0000 mg | Freq: Once | INTRAVENOUS | Status: AC
  Administered 2020-03-21: 10 mg via INTRAVENOUS
  Filled 2020-03-21: qty 10

## 2020-03-21 MED ORDER — PALONOSETRON HCL INJECTION 0.25 MG/5ML
INTRAVENOUS | Status: AC
  Filled 2020-03-21: qty 5

## 2020-03-21 MED ORDER — SODIUM CHLORIDE 0.9 % IV SOLN
1500.0000 mg | Freq: Once | INTRAVENOUS | Status: AC
  Administered 2020-03-21: 1500 mg via INTRAVENOUS
  Filled 2020-03-21: qty 30

## 2020-03-21 MED ORDER — SODIUM CHLORIDE 0.9 % IV SOLN
Freq: Once | INTRAVENOUS | Status: AC
  Filled 2020-03-21: qty 250

## 2020-03-21 MED ORDER — HEPARIN SOD (PORK) LOCK FLUSH 100 UNIT/ML IV SOLN
500.0000 [IU] | Freq: Once | INTRAVENOUS | Status: AC | PRN
  Administered 2020-03-21: 500 [IU]
  Filled 2020-03-21: qty 5

## 2020-03-21 MED ORDER — SODIUM CHLORIDE 0.9 % IV SOLN
294.0000 mg | Freq: Once | INTRAVENOUS | Status: AC
  Administered 2020-03-21: 290 mg via INTRAVENOUS
  Filled 2020-03-21: qty 29

## 2020-03-21 NOTE — Patient Instructions (Signed)

## 2020-03-21 NOTE — Patient Instructions (Signed)
[place] Discharge Instructions for Patients Receiving Chemotherapy  Today you received the following chemotherapy agents Imfinzi, Carboplatin, Etoposide  To help prevent nausea and vomiting after your treatment, we encourage you to take your nausea medication as prescribed.   If you develop nausea and vomiting that is not controlled by your nausea medication, call the clinic.   BELOW ARE SYMPTOMS THAT SHOULD BE REPORTED IMMEDIATELY:  *FEVER GREATER THAN 100.5 F  *CHILLS WITH OR WITHOUT FEVER  NAUSEA AND VOMITING THAT IS NOT CONTROLLED WITH YOUR NAUSEA MEDICATION  *UNUSUAL SHORTNESS OF BREATH  *UNUSUAL BRUISING OR BLEEDING  TENDERNESS IN MOUTH AND THROAT WITH OR WITHOUT PRESENCE OF ULCERS  *URINARY PROBLEMS  *BOWEL PROBLEMS  UNUSUAL RASH Items with * indicate a potential emergency and should be followed up as soon as possible.  Feel free to call the clinic should you have any questions or concerns. The clinic phone number is [phone].  Please show the [place] at check-in to the Emergency Department and triage nurse.  Durvalumab injection What is this medicine? DURVALUMAB (dur VAL ue mab) is a monoclonal antibody. It is used to treat urothelial cancer and lung cancer. This medicine may be used for other purposes; ask your health care provider or pharmacist if you have questions. COMMON BRAND NAME(S): IMFINZI What should I tell my health care provider before I take this medicine? They need to know if you have any of these conditions:  diabetes  immune system problems  infection  inflammatory bowel disease  kidney disease  liver disease  lung or breathing disease  lupus  organ transplant  stomach or intestine problems  thyroid disease  an unusual or allergic reaction to durvalumab, other medicines, foods, dyes, or preservatives  pregnant or trying to get pregnant  breast-feeding How should I use this medicine? This  medicine is for infusion into a vein. It is given by a health care professional in a hospital or clinic setting. A special MedGuide will be given to you before each treatment. Be sure to read this information carefully each time. Talk to your pediatrician regarding the use of this medicine in children. Special care may be needed. Overdosage: If you think you have taken too much of this medicine contact a poison control center or emergency room at once. NOTE: This medicine is only for you. Do not share this medicine with others. What if I miss a dose? It is important not to miss your dose. Call your doctor or health care professional if you are unable to keep an appointment. What may interact with this medicine? Interactions have not been studied. This list may not describe all possible interactions. Give your health care provider a list of all the medicines, herbs, non-prescription drugs, or dietary supplements you use. Also tell them if you smoke, drink alcohol, or use illegal drugs. Some items may interact with your medicine. What should I watch for while using this medicine? This drug may make you feel generally unwell. Continue your course of treatment even though you feel ill unless your doctor tells you to stop. You may need blood work done while you are taking this medicine. Do not become pregnant while taking this medicine or for 3 months after stopping it. Women should inform their doctor if they wish to become pregnant or think they might be pregnant. There is a potential for serious side effects to an unborn child. Talk to your health care professional or pharmacist for more information. Do  not breast-feed an infant while taking this medicine or for 3 months after stopping it. What side effects may I notice from receiving this medicine? Side effects that you should report to your doctor or health care professional as soon as possible:  allergic reactions like skin rash, itching or hives,  swelling of the face, lips, or tongue  black, tarry stools  bloody or watery diarrhea  breathing problems  change in emotions or moods  change in sex drive  changes in vision  chest pain or chest tightness  chills  confusion  cough  facial flushing  fever  headache  signs and symptoms of high blood sugar such as dizziness; dry mouth; dry skin; fruity breath; nausea; stomach pain; increased hunger or thirst; increased urination  signs and symptoms of liver injury like dark yellow or brown urine; general ill feeling or flu-like symptoms; light-colored stools; loss of appetite; nausea; right upper belly pain; unusually weak or tired; yellowing of the eyes or skin  stomach pain  trouble passing urine or change in the amount of urine  weight gain or weight loss Side effects that usually do not require medical attention (report these to your doctor or health care professional if they continue or are bothersome):  bone pain  constipation  loss of appetite  muscle pain  nausea  swelling of the ankles, feet, hands  tiredness This list may not describe all possible side effects. Call your doctor for medical advice about side effects. You may report side effects to FDA at 1-800-FDA-1088. Where should I keep my medicine? This drug is given in a hospital or clinic and will not be stored at home. NOTE: This sheet is a summary. It may not cover all possible information. If you have questions about this medicine, talk to your doctor, pharmacist, or health care provider.  2020 Elsevier/Gold Standard (2016-11-27 19:25:04)  Carboplatin injection What is this medicine? CARBOPLATIN (KAR boe pla tin) is a chemotherapy drug. It targets fast dividing cells, like cancer cells, and causes these cells to die. This medicine is used to treat ovarian cancer and many other cancers. This medicine may be used for other purposes; ask your health care provider or pharmacist if you have  questions. COMMON BRAND NAME(S): Paraplatin What should I tell my health care provider before I take this medicine? They need to know if you have any of these conditions:  blood disorders  hearing problems  kidney disease  recent or ongoing radiation therapy  an unusual or allergic reaction to carboplatin, cisplatin, other chemotherapy, other medicines, foods, dyes, or preservatives  pregnant or trying to get pregnant  breast-feeding How should I use this medicine? This drug is usually given as an infusion into a vein. It is administered in a hospital or clinic by a specially trained health care professional. Talk to your pediatrician regarding the use of this medicine in children. Special care may be needed. Overdosage: If you think you have taken too much of this medicine contact a poison control center or emergency room at once. NOTE: This medicine is only for you. Do not share this medicine with others. What if I miss a dose? It is important not to miss a dose. Call your doctor or health care professional if you are unable to keep an appointment. What may interact with this medicine?  medicines for seizures  medicines to increase blood counts like filgrastim, pegfilgrastim, sargramostim  some antibiotics like amikacin, gentamicin, neomycin, streptomycin, tobramycin  vaccines Talk to your  doctor or health care professional before taking any of these medicines:  acetaminophen  aspirin  ibuprofen  ketoprofen  naproxen This list may not describe all possible interactions. Give your health care provider a list of all the medicines, herbs, non-prescription drugs, or dietary supplements you use. Also tell them if you smoke, drink alcohol, or use illegal drugs. Some items may interact with your medicine. What should I watch for while using this medicine? Your condition will be monitored carefully while you are receiving this medicine. You will need important blood work done  while you are taking this medicine. This drug may make you feel generally unwell. This is not uncommon, as chemotherapy can affect healthy cells as well as cancer cells. Report any side effects. Continue your course of treatment even though you feel ill unless your doctor tells you to stop. In some cases, you may be given additional medicines to help with side effects. Follow all directions for their use. Call your doctor or health care professional for advice if you get a fever, chills or sore throat, or other symptoms of a cold or flu. Do not treat yourself. This drug decreases your body's ability to fight infections. Try to avoid being around people who are sick. This medicine may increase your risk to bruise or bleed. Call your doctor or health care professional if you notice any unusual bleeding. Be careful brushing and flossing your teeth or using a toothpick because you may get an infection or bleed more easily. If you have any dental work done, tell your dentist you are receiving this medicine. Avoid taking products that contain aspirin, acetaminophen, ibuprofen, naproxen, or ketoprofen unless instructed by your doctor. These medicines may hide a fever. Do not become pregnant while taking this medicine. Women should inform their doctor if they wish to become pregnant or think they might be pregnant. There is a potential for serious side effects to an unborn child. Talk to your health care professional or pharmacist for more information. Do not breast-feed an infant while taking this medicine. What side effects may I notice from receiving this medicine? Side effects that you should report to your doctor or health care professional as soon as possible:  allergic reactions like skin rash, itching or hives, swelling of the face, lips, or tongue  signs of infection - fever or chills, cough, sore throat, pain or difficulty passing urine  signs of decreased platelets or bleeding - bruising, pinpoint  red spots on the skin, black, tarry stools, nosebleeds  signs of decreased red blood cells - unusually weak or tired, fainting spells, lightheadedness  breathing problems  changes in hearing  changes in vision  chest pain  high blood pressure  low blood counts - This drug may decrease the number of white blood cells, red blood cells and platelets. You may be at increased risk for infections and bleeding.  nausea and vomiting  pain, swelling, redness or irritation at the injection site  pain, tingling, numbness in the hands or feet  problems with balance, talking, walking  trouble passing urine or change in the amount of urine Side effects that usually do not require medical attention (report to your doctor or health care professional if they continue or are bothersome):  hair loss  loss of appetite  metallic taste in the mouth or changes in taste This list may not describe all possible side effects. Call your doctor for medical advice about side effects. You may report side effects to FDA at  1-800-FDA-1088. Where should I keep my medicine? This drug is given in a hospital or clinic and will not be stored at home. NOTE: This sheet is a summary. It may not cover all possible information. If you have questions about this medicine, talk to your doctor, pharmacist, or health care provider.  2020 Elsevier/Gold Standard (2007-12-23 14:38:05)  Etoposide, VP-16 injection What is this medicine? ETOPOSIDE, VP-16 (e toe POE side) is a chemotherapy drug. It is used to treat testicular cancer, lung cancer, and other cancers. This medicine may be used for other purposes; ask your health care provider or pharmacist if you have questions. COMMON BRAND NAME(S): Etopophos, Toposar, VePesid What should I tell my health care provider before I take this medicine? They need to know if you have any of these conditions:  infection  kidney disease  liver disease  low blood counts, like low  white cell, platelet, or red cell counts  an unusual or allergic reaction to etoposide, other medicines, foods, dyes, or preservatives  pregnant or trying to get pregnant  breast-feeding How should I use this medicine? This medicine is for infusion into a vein. It is administered in a hospital or clinic by a specially trained health care professional. Talk to your pediatrician regarding the use of this medicine in children. Special care may be needed. Overdosage: If you think you have taken too much of this medicine contact a poison control center or emergency room at once. NOTE: This medicine is only for you. Do not share this medicine with others. What if I miss a dose? It is important not to miss your dose. Call your doctor or health care professional if you are unable to keep an appointment. What may interact with this medicine? This medicine may interact with the following medications:  warfarin This list may not describe all possible interactions. Give your health care provider a list of all the medicines, herbs, non-prescription drugs, or dietary supplements you use. Also tell them if you smoke, drink alcohol, or use illegal drugs. Some items may interact with your medicine. What should I watch for while using this medicine? Visit your doctor for checks on your progress. This drug may make you feel generally unwell. This is not uncommon, as chemotherapy can affect healthy cells as well as cancer cells. Report any side effects. Continue your course of treatment even though you feel ill unless your doctor tells you to stop. In some cases, you may be given additional medicines to help with side effects. Follow all directions for their use. Call your doctor or health care professional for advice if you get a fever, chills or sore throat, or other symptoms of a cold or flu. Do not treat yourself. This drug decreases your body's ability to fight infections. Try to avoid being around people who  are sick. This medicine may increase your risk to bruise or bleed. Call your doctor or health care professional if you notice any unusual bleeding. Talk to your doctor about your risk of cancer. You may be more at risk for certain types of cancers if you take this medicine. Do not become pregnant while taking this medicine or for at least 6 months after stopping it. Women should inform their doctor if they wish to become pregnant or think they might be pregnant. Women of child-bearing potential will need to have a negative pregnancy test before starting this medicine. There is a potential for serious side effects to an unborn child. Talk to your health care  professional or pharmacist for more information. Do not breast-feed an infant while taking this medicine. Men must use a latex condom during sexual contact with a woman while taking this medicine and for at least 4 months after stopping it. A latex condom is needed even if you have had a vasectomy. Contact your doctor right away if your partner becomes pregnant. Do not donate sperm while taking this medicine and for at least 4 months after you stop taking this medicine. Men should inform their doctors if they wish to father a child. This medicine may lower sperm counts. What side effects may I notice from receiving this medicine? Side effects that you should report to your doctor or health care professional as soon as possible:  allergic reactions like skin rash, itching or hives, swelling of the face, lips, or tongue  low blood counts - this medicine may decrease the number of white blood cells, red blood cells, and platelets. You may be at increased risk for infections and bleeding  nausea, vomiting  redness, blistering, peeling or loosening of the skin, including inside the mouth  signs and symptoms of infection like fever; chills; cough; sore throat; pain or trouble passing urine  signs and symptoms of low red blood cells or anemia such as  unusually weak or tired; feeling faint or lightheaded; falls; breathing problems  unusual bruising or bleeding Side effects that usually do not require medical attention (report to your doctor or health care professional if they continue or are bothersome):  changes in taste  diarrhea  hair loss  loss of appetite  mouth sores This list may not describe all possible side effects. Call your doctor for medical advice about side effects. You may report side effects to FDA at 1-800-FDA-1088. Where should I keep my medicine? This drug is given in a hospital or clinic and will not be stored at home. NOTE: This sheet is a summary. It may not cover all possible information. If you have questions about this medicine, talk to your doctor, pharmacist, or health care provider.  2020 Elsevier/Gold Standard (2018-11-12 16:57:15)

## 2020-03-21 NOTE — Progress Notes (Signed)
[place] [phone]   [phone]  OFFICE PROGRESS NOTE  [name], [name], MD [address] [place] [address]  DIAGNOSIS: Extensive stage (T2 a, N2, M1 C) small cell lung cancer presented with central right upper lobe lung mass in addition to left lower lobe pulmonary metastasis and bilateral hilar, subcarinal and bilateral paratracheal and left prevascular lymphadenopathy in addition to bone metastasis in the left iliac wing diagnosed in April 2021.  PRIOR THERAPY: None  CURRENT THERAPY: Systemic chemotherapy with carboplatin for AUC of 5 on day 1, etoposide 100 mg/M2 on days 1, 2 and 3 with Neulasta support in addition to Imfinzi 1500 mg IV every 3 weeks during chemotherapy followed by maintenance Imfinzi 1500 mg IV every 4 weeks after cycle #4.  Status post 2 cycles.  INTERVAL HISTORY: [name] 85 y.o. female returns to the clinic today for follow-up visit accompanied by her daughter.  The patient is feeling fine today with no concerning complaints except for shortness of breath with exertion.  She was seen recently at the emergency department and imaging studies showed no evidence for pulmonary embolism.  She denied having any current chest pain, cough or hemoptysis.  She denied having any fever or chills.  She has no nausea, vomiting, diarrhea or constipation.  She has no headache or visual changes.  She has small areas of phlebitis in the left arm.  The patient is here today for evaluation before starting cycle #3 of her chemotherapy.  MEDICAL HISTORY: Past Medical History:  Diagnosis Date  . CAD (coronary artery disease), native coronary artery    PTCA of distal right coronary artery 1997 Cypher stents to circumflex 2005 Cardiac cath in 2011 with patent stent to circumflex and moderate disease elsewhere treated medically   . Cancer ([facility])   . Cardiac pacemaker in situ 12/24/2015   Original implant reportedly in 1991 for  tachybradycardia syndrome, generator change in 1999, 2004 and evidently again in 2016 in [place]   . COPD (chronic obstructive pulmonary disease) ([facility])   . Diabetes mellitus without complication ([place])   . Hypertension   . Hypothyroidism 03/23/2010  . Pacemaker   . Paroxysmal atrial fibrillation ([place]) 03/23/2010   CHA2DS2VASC score 5     ALLERGIES:  has No Known Allergies.  MEDICATIONS:  Current Outpatient Medications  Medication Sig Dispense Refill  . acetaminophen (TYLENOL) 500 MG tablet Take 500 mg by mouth every 6 (six) hours as needed for headache.    . albuterol (VENTOLIN HFA) 108 (90 Base) MCG/ACT inhaler TAKE 2 PUFFS BY MOUTH EVERY 6 HOURS AS NEEDED FOR WHEEZE OR SHORTNESS OF BREATH*NOT COVERED* (Patient taking differently: Inhale 2 puffs into the lungs every 6 (six) hours as needed for wheezing or shortness of breath. ) 8.5 g 1  . atorvastatin (LIPITOR) 10 MG tablet TAKE 1 TABLET BY MOUTH EVERY DAY 90 tablet 3  . carvedilol (COREG) 12.5 MG tablet TAKE 1 TABLET (12.5 MG TOTAL) BY MOUTH 2 (TWO) TIMES DAILY WITH A MEAL. 180 tablet 2  . [employer] NICOTINE TRANSDERMAL SYS 14 MG/24HR patch PLACE 1 PATCH (14 MG TOTAL) ONTO THE SKIN DAILY. 28 patch 0  . diclofenac sodium (VOLTAREN) 1 % GEL Apply 4 g topically 4 (four) times daily. (Patient taking differently: Apply 4 g topically 4 (four) times daily as needed (pain). ) 4 Tube 3  . dicyclomine (BENTYL) 10 MG capsule TAKE 1 CAPSULE (10 MG TOTAL) BY MOUTH 4 (FOUR) TIMES  DAILY - BEFORE MEALS AND AT BEDTIME. 90 capsule 0  . fenofibrate (TRICOR) 48 MG tablet TAKE 1 TABLET BY MOUTH EVERY DAY (Patient taking differently: Take 48 mg by mouth daily after lunch. ) 90 tablet 3  . glucose blood (ONETOUCH VERIO) test strip Use to test 3-4 times daily. 300 strip 4  . levothyroxine (SYNTHROID) 112 MCG tablet TAKE 1 TABLET (112 MCG TOTAL) BY MOUTH DAILY BEFORE BREAKFAST. 90 tablet 2  . linagliptin (TRADJENTA) 5 MG TABS tablet TAKE 1 TABLET (5 MG TOTAL) DAILY BY  MOUTH. (Patient taking differently: Take 5 mg by mouth daily after breakfast. ) 90 tablet 1  . losartan (COZAAR) 50 MG tablet Take 2 tablets by mouth daily. (Patient taking differently: Take 100 mg by mouth daily. ) 180 tablet 1  . mupirocin ointment (BACTROBAN) 2 % Apply 1 application topically 2 (two) times daily. 22 g 0  . nitroGLYCERIN (NITROSTAT) 0.4 MG SL tablet Place 1 tablet (0.4 mg total) under the tongue every 5 (five) minutes x 3 doses as needed for chest pain (Max 3 doses within 15 min. Call 911). 10 tablet 0  . oxyCODONE-acetaminophen (PERCOCET/ROXICET) 5-325 MG tablet Take 1 tablet by mouth every 8 (eight) hours as needed for severe pain. 20 tablet 0  . pantoprazole (PROTONIX) 40 MG tablet TAKE 1 TABLET BY MOUTH EVERY DAY 90 tablet 1  . potassium chloride SA (KLOR-CON M20) 20 MEQ tablet TAKE 1 TABLET BY MOUTH TWICE A DAY FOR 2 DAYS THEN TAKE 1 TABLET BY MOUTH EVERY DAY 90 tablet 1  . predniSONE (DELTASONE) 10 MG tablet 5 tab x 1 day, 4 tab x 1 day, 3 tab x 1 day, 2 tab x 1 day, 1 tab x 1 day, stop 15 tablet 0  . prochlorperazine (COMPAZINE) 10 MG tablet Take 1 tablet (10 mg total) by mouth every 6 (six) hours as needed for nausea or vomiting. 30 tablet 1  . XARELTO 15 MG TABS tablet TAKE 1 TABLET BY MOUTH DAILY WITH SUPPER. (Patient taking differently: Take 15 mg by mouth daily with supper. ) 30 tablet 3   No current facility-administered medications for this visit.    SURGICAL HISTORY:  Past Surgical History:  Procedure Laterality Date  . BIOPSY  01/08/2020   Procedure: BIOPSY;  Surgeon: Garner Nash, DO;  Location: [facility];  Service: Pulmonary;;  . BRONCHIAL BRUSHINGS  01/08/2020   Procedure: BRONCHIAL BRUSHINGS;  Surgeon: Garner Nash, DO;  Location: [facility];  Service: Pulmonary;;  . BRONCHIAL WASHINGS  01/08/2020   Procedure: BRONCHIAL WASHINGS;  Surgeon: Garner Nash, DO;  Location: [facility];  Service: Pulmonary;;  . CARDIAC CATHETERIZATION N/A 12/26/2015     Procedure: Left Heart Cath and Coronary Angiography;  Surgeon: Peter M [name], MD;  Location: [facility];  Service: Cardiovascular;  Laterality: N/A;  . CARDIOVERSION  05/2015  . CHOLECYSTECTOMY  2012  . CORONARY ANGIOPLASTY WITH STENT PLACEMENT  1990  . ENDOBRONCHIAL ULTRASOUND  01/08/2020   Procedure: ENDOBRONCHIAL ULTRASOUND;  Surgeon: Garner Nash, DO;  Location: [facility];  Service: Pulmonary;;  . FINE NEEDLE ASPIRATION BIOPSY  01/08/2020   Procedure: FINE NEEDLE ASPIRATION BIOPSY;  Surgeon: Garner Nash, DO;  Location: [facility];  Service: Pulmonary;;  . HERNIA REPAIR    . IR IMAGING GUIDED PORT INSERTION  02/18/2020  . PACEMAKER GENERATOR CHANGE  [id number], 2016  . PACEMAKER INSERTION  1991  . VIDEO BRONCHOSCOPY WITH ENDOBRONCHIAL ULTRASOUND N/A 01/08/2020   Procedure:  VIDEO BRONCHOSCOPY;  Surgeon: Garner Nash, DO;  Location: [facility];  Service: Pulmonary;  Laterality: N/A;    REVIEW OF SYSTEMS:  Constitutional: positive for fatigue Eyes: negative Ears, nose, mouth, throat, and face: negative Respiratory: positive for dyspnea on exertion Cardiovascular: negative Gastrointestinal: negative Genitourinary:negative Integument/breast: negative Hematologic/lymphatic: negative Musculoskeletal:positive for muscle weakness Neurological: negative Behavioral/Psych: negative Endocrine: negative Allergic/Immunologic: negative   PHYSICAL EXAMINATION: General appearance: alert, cooperative, appears stated age, fatigued and no distress Head: Normocephalic, without obvious abnormality, atraumatic Neck: no adenopathy, no JVD, supple, symmetrical, trachea midline and thyroid not enlarged, symmetric, no tenderness/mass/nodules Lymph nodes: Cervical, supraclavicular, and axillary nodes normal. Resp: clear to auscultation bilaterally Back: symmetric, no curvature. ROM normal. No CVA tenderness. Cardio: regular rate and rhythm, S1, S2 normal, no murmur, click, rub or  gallop GI: soft, non-tender; bowel sounds normal; no masses,  no organomegaly Extremities: extremities normal, atraumatic, no cyanosis or edema Neurologic: Alert and oriented X 3, normal strength and tone. Normal symmetric reflexes. Normal coordination and gait  ECOG PERFORMANCE STATUS: 1 - Symptomatic but completely ambulatory  Blood pressure (!) 140/54, pulse 71, temperature (!) 97.2 F (36.2 C), temperature source Temporal, resp. rate 20, height 5\' 2"  (1.575 m), weight 116 lb (52.6 kg), SpO2 100 %.  LABORATORY DATA: Lab Results  Component Value Date   WBC 24.8 (HH) 03/17/2020   HGB 8.7 (L) 03/17/2020   HCT 27.1 (L) 03/17/2020   MCV 88 03/17/2020   PLT 166 03/17/2020      Chemistry      Component Value Date/Time   NA 143 03/17/2020 0915   K 3.5 03/17/2020 0915   CL 104 03/17/2020 0915   CO2 24 03/17/2020 0915   BUN 6 (L) 03/17/2020 0915   CREATININE 0.65 03/17/2020 0915   CREATININE 0.65 03/16/2020 1017      Component Value Date/Time   CALCIUM 8.8 03/17/2020 0915   ALKPHOS 127 (H) 03/16/2020 1017   AST 12 (L) 03/16/2020 1017   ALT 18 03/16/2020 1017   BILITOT 0.4 03/16/2020 1017       RADIOGRAPHIC STUDIES: DG Chest 2 View  Result Date: 02/21/2020 CLINICAL DATA:  Chest pain.  Back pain. EXAM: CHEST - 2 VIEW COMPARISON:  January 05, 2020 FINDINGS: Stable pacemaker. The heart, hila, and mediastinum are stable. A left Port-A-Cath terminates in the central SVC. No pneumothorax. Mild bibasilar atelectasis. No suspicious infiltrates. No overt edema. No nodules or masses. IMPRESSION: No acute abnormalities are identified. Electronically Signed   By: Dorise Bullion III M.D   On: 02/21/2020 14:54   CT ANGIO CHEST PE W OR WO CONTRAST  Result Date: 03/16/2020 CLINICAL DATA:  85 year old female with history of shortness of breath with hypoxia since yesterday. EXAM: CT ANGIOGRAPHY CHEST WITH CONTRAST TECHNIQUE: Multidetector CT imaging of the chest was performed using the standard  protocol during bolus administration of intravenous contrast. Multiplanar CT image reconstructions and MIPs were obtained to evaluate the vascular anatomy. CONTRAST:  170mL OMNIPAQUE IOHEXOL 350 MG/ML SOLN COMPARISON:  Chest CT 02/21/2020. FINDINGS: Cardiovascular: No filling defects are noted in the pulmonary arterial tree to suggest underlying pulmonary embolism. Heart size is mildly enlarged. There is no significant pericardial fluid, thickening or pericardial calcification. There is aortic atherosclerosis, as well as atherosclerosis of the great vessels of the mediastinum and the coronary arteries, including calcified atherosclerotic plaque in the left main, left anterior descending, left circumflex and right coronary arteries. Right-sided pacemaker device in place with lead tips terminating in the right  atrium and right ventricle. Mediastinum/Nodes: Multiple prominent borderline enlarged and mildly enlarged mediastinal and hilar lymph nodes are noted, largest of which include a 1.5 cm right hilar node (axial image 130 of series 6), and 2.2 cm short axis subcarinal lymph node (axial image 138 of series 6). Previously noted prevascular lymph has decreased in size, currently measuring 1 cm in short axis. Esophagus is unremarkable in appearance. No axillary lymphadenopathy. Lungs/Pleura: 2.6 x 2.0 cm macrolobulated slightly spiculated nodule in the central aspect of the right upper lobe (axial image 59 of series 7), similar to the prior study. Ovoid shaped left lower lobe nodule measuring 1.4 x 0.9 cm (axial image 76 of series 7), slightly decreased compared to the prior examination. Small right pleural effusion lying dependently with some associated areas of passive atelectasis in the dependent portions of the right lung. No acute consolidative airspace disease. No left pleural effusion. Diffuse bronchial wall thickening with moderate centrilobular and paraseptal emphysema. Upper Abdomen: Aortic atherosclerosis.  Musculoskeletal: There are no aggressive appearing lytic or blastic lesions noted in the visualized portions of the skeleton. Review of the MIP images confirms the above findings. IMPRESSION: 1. No evidence of pulmonary embolism. 2. Known central right upper lobe neoplasm with mediastinal lymphadenopathy, similar to slightly regressed compared to the prior study. Additionally, there has been slight regression of the left lower lobe nodule seen on the prior examination, as detailed above. 3. Small right pleural effusion lying dependently with areas of passive atelectasis in the right lower lobe. 4. Aortic atherosclerosis, in addition to left main and 3 vessel coronary artery disease. 5. Mild cardiomegaly. 6. Diffuse bronchial wall thickening with moderate centrilobular and paraseptal emphysema; imaging findings suggestive of underlying COPD. Aortic Atherosclerosis (ICD10-I70.0) and Emphysema (ICD10-J43.9). Electronically Signed   By: Vinnie Langton M.D.   On: 03/16/2020 16:01   CT Angio Chest PE W/Cm &/Or Wo Cm  Result Date: 02/21/2020 CLINICAL DATA:  Chest and back pain, right upper lobe lung cancer EXAM: CT ANGIOGRAPHY CHEST WITH CONTRAST TECHNIQUE: Multidetector CT imaging of the chest was performed using the standard protocol during bolus administration of intravenous contrast. Multiplanar CT image reconstructions and MIPs were obtained to evaluate the vascular anatomy. CONTRAST:  23mL OMNIPAQUE IOHEXOL 350 MG/ML SOLN COMPARISON:  01/15/2020, 12/27/2019 FINDINGS: Cardiovascular: This is a technically adequate evaluation of the pulmonary vasculature. There are no filling defects or pulmonary emboli. The heart is enlarged but stable. Pacemaker unchanged. Stable atherosclerosis of the aorta and coronary vasculature. Mediastinum/Nodes: Diffuse adenopathy throughout the mediastinum and bilateral hilar regions. Index lymph node within the anterior mediastinum reference image 45/5 measures 18 x 28 mm, unchanged  since recent PET scan. The thyroid, trachea, and esophagus are grossly unremarkable. Lungs/Pleura: The spiculated right upper lobe mass seen previously now measures 2.5 x 2.2 cm reference image 51/5. On prior PET scan, this had measured approximately 2.6 x 3.2 cm. The left lower lobe mass measures 2.7 x 1.8 cm, unchanged since PET scan. No new pulmonary nodules or masses. Severe upper lobe predominant emphysema again seen. No new airspace disease, effusion, or pneumothorax. The central airways are patent. Upper Abdomen: No acute abnormality. Musculoskeletal: There are no acute or destructive bony lesions. Reconstructed images demonstrate no additional findings. Review of the MIP images confirms the above findings. IMPRESSION: 1. No evidence of pulmonary embolus. 2. Spiculated right upper lobe mass consistent with known lung cancer. Slight decreased size since prior study. 3. Stable left lower lobe mass and mediastinal/hilar adenopathy, consistent with intrathoracic metastases.  4. Aortic Atherosclerosis (ICD10-I70.0) and Emphysema (ICD10-J43.9). Electronically Signed   By: Randa Ngo M.D.   On: 02/21/2020 19:24   CT Abdomen Pelvis W Contrast  Result Date: 03/18/2020 CLINICAL DATA:  Primary Cancer Type: Lung Imaging Indication: Routine surveillance. Left iliac wing metastasis. Interval therapy since last imaging? Yes Initial Cancer Diagnosis Date: 01/08/2020; Established by: Biopsy-proven Detailed Pathology: Extensive stage small cell lung cancer. Primary Tumor location: Central right upper lobe lung mass, left lower lobe pulmonary metastasis. Chemotherapy: Yes; Ongoing? Yes; Most recent administration: 03/05/2020 Immunotherapy?  Yes; Type: Imfinzi; Ongoing? Yes Radiation therapy? No EXAM: CT ABDOMEN AND PELVIS WITH CONTRAST TECHNIQUE: Multidetector CT imaging of the abdomen and pelvis was performed using the standard protocol following bolus administration of intravenous contrast. CONTRAST:  152mL OMNIPAQUE  IOHEXOL 300 MG/ML  SOLN COMPARISON:  01/15/2020 PET-CT. CT chest, abdomen and pelvis 12/27/2019. FINDINGS: Lower chest: Small right pleural effusion is increased in volume from 12/27/2019. Nodule within the posterior left lower lobe measures 1.0 x 0.6 cm, image 7/4. On 01/15/2020 this measured 2.4 x 1.6 cm. Hepatobiliary: No focal liver abnormality is seen. Status post cholecystectomy. No biliary dilatation. Pancreas: Unremarkable. No pancreatic ductal dilatation or surrounding inflammatory changes. Spleen: Normal in size without focal abnormality. Adrenals/Urinary Tract: Normal adrenal glands. Bilateral renal vascular calcifications identified. 1.1 cm left kidney cyst. No suspicious mass or hydronephrosis identified. Urinary bladder is unremarkable. Stomach/Bowel: Stomach is within normal limits. No evidence of bowel wall thickening, distention, or inflammatory changes. Vascular/Lymphatic: Aortic atherosclerosis. No aneurysm. No abdominal or pelvic adenopathy. Reproductive: Uterus and bilateral adnexa are unremarkable. Other: No free fluid or fluid collections. Musculoskeletal: Previous ORIF of right femur. Asymmetric sclerosis within the left iliac bone adjacent to the SI joint appears unchanged. The previous FDG avid lytic lesion within the left iliac bone is not confidently identified on today's exam. No suspicious or aggressive bone lesions identified at this time. IMPRESSION: 1. Decrease in size of index pulmonary nodule in the left lower lobe. 2. No CT correlate within the left iliac bone on today's exam 2 correspond with the previously noted FDG avid bone metastasis. 3. Small right pleural effusion is new from 01/15/2020. 4. No new sites of disease identified. Electronically Signed   By: Kerby Moors M.D.   On: 03/18/2020 10:14    ASSESSMENT AND PLAN: This is a very pleasant 85 years old African-American female recently diagnosed with extensive stage small cell lung cancer presented with central right  upper lobe lung mass in addition to left lower lobe lung nodule and mediastinal lymphadenopathy and metastatic bone lesion in the left iliac wing diagnosed in April 2021. The patient started her treatment today with carboplatin for AUC of 5 on day 1, etoposide 100 mg/M2 on days 1, 2 and 3 with Neulasta support in addition to Imfinzi 1500 mg every 3 weeks during the chemotherapy course followed by 1500 mg every 4 weeks as maintenance if no evidence for disease progression.  Status post 2 cycles. Her insurance declined the treatment with Cos[facility]. The patient continues to tolerate her treatment well with no concerning adverse effect except for fatigue. She had repeat CT scan of the chest performed recently.  I personally and independently reviewed the scan. Her scan showed improvement of her disease. I recommended for the patient to continue her current treatment with carboplatin, etoposide and Imfinzi and she will proceed with cycle #3 today. The patient will come back for follow-up visit in 3 weeks for evaluation before starting cycle #4. She  was advised to call immediately if she has any concerning symptoms in the interval. The patient voices understanding of current disease status and treatment options and is in agreement with the current care plan.  All questions were answered. The patient knows to call the clinic with any problems, questions or concerns. We can certainly see the patient much sooner if necessary.  Disclaimer: This note was dictated with voice recognition software. Similar sounding words can inadvertently be transcribed and may not be corrected upon review.

## 2020-03-22 ENCOUNTER — Other Ambulatory Visit: Payer: Self-pay

## 2020-03-22 ENCOUNTER — Inpatient Hospital Stay: Payer: Medicare Other

## 2020-03-22 ENCOUNTER — Other Ambulatory Visit: Payer: Self-pay | Admitting: Medical Oncology

## 2020-03-22 VITALS — BP 125/60 | HR 70 | Temp 98.0°F | Resp 18

## 2020-03-22 DIAGNOSIS — C3491 Malignant neoplasm of unspecified part of right bronchus or lung: Secondary | ICD-10-CM

## 2020-03-22 DIAGNOSIS — C7802 Secondary malignant neoplasm of left lung: Secondary | ICD-10-CM | POA: Diagnosis not present

## 2020-03-22 DIAGNOSIS — Z452 Encounter for adjustment and management of vascular access device: Secondary | ICD-10-CM | POA: Diagnosis not present

## 2020-03-22 DIAGNOSIS — Z5189 Encounter for other specified aftercare: Secondary | ICD-10-CM | POA: Diagnosis not present

## 2020-03-22 DIAGNOSIS — C7951 Secondary malignant neoplasm of bone: Secondary | ICD-10-CM | POA: Diagnosis not present

## 2020-03-22 DIAGNOSIS — Z5112 Encounter for antineoplastic immunotherapy: Secondary | ICD-10-CM | POA: Diagnosis not present

## 2020-03-22 DIAGNOSIS — Z5111 Encounter for antineoplastic chemotherapy: Secondary | ICD-10-CM

## 2020-03-22 DIAGNOSIS — E039 Hypothyroidism, unspecified: Secondary | ICD-10-CM | POA: Diagnosis not present

## 2020-03-22 DIAGNOSIS — C3411 Malignant neoplasm of upper lobe, right bronchus or lung: Secondary | ICD-10-CM | POA: Diagnosis not present

## 2020-03-22 DIAGNOSIS — Z79899 Other long term (current) drug therapy: Secondary | ICD-10-CM | POA: Diagnosis not present

## 2020-03-22 MED ORDER — HEPARIN SOD (PORK) LOCK FLUSH 100 UNIT/ML IV SOLN
500.0000 [IU] | Freq: Once | INTRAVENOUS | Status: AC | PRN
  Administered 2020-03-22: 500 [IU]
  Filled 2020-03-22: qty 5

## 2020-03-22 MED ORDER — SODIUM CHLORIDE 0.9 % IV SOLN
10.0000 mg | Freq: Once | INTRAVENOUS | Status: AC
  Administered 2020-03-22: 10 mg via INTRAVENOUS
  Filled 2020-03-22: qty 10

## 2020-03-22 MED ORDER — SODIUM CHLORIDE 0.9 % IV SOLN
100.0000 mg/m2 | Freq: Once | INTRAVENOUS | Status: AC
  Administered 2020-03-22: 150 mg via INTRAVENOUS
  Filled 2020-03-22: qty 7.5

## 2020-03-22 MED ORDER — SODIUM CHLORIDE 0.9% FLUSH
10.0000 mL | INTRAVENOUS | Status: DC | PRN
  Administered 2020-03-22: 10 mL
  Filled 2020-03-22: qty 10

## 2020-03-22 MED ORDER — SODIUM CHLORIDE 0.9 % IV SOLN
Freq: Once | INTRAVENOUS | Status: AC
  Filled 2020-03-22: qty 250

## 2020-03-22 NOTE — Patient Instructions (Signed)
[facility] Discharge Instructions for Patients Receiving Chemotherapy  Today you received the following chemotherapy agents: etoposide  To help prevent nausea and vomiting after your treatment, we encourage you to take your nausea medication as directed.   If you develop nausea and vomiting that is not controlled by your nausea medication, call the clinic.   BELOW ARE SYMPTOMS THAT SHOULD BE REPORTED IMMEDIATELY:  *FEVER GREATER THAN 100.5 F  *CHILLS WITH OR WITHOUT FEVER  NAUSEA AND VOMITING THAT IS NOT CONTROLLED WITH YOUR NAUSEA MEDICATION  *UNUSUAL SHORTNESS OF BREATH  *UNUSUAL BRUISING OR BLEEDING  TENDERNESS IN MOUTH AND THROAT WITH OR WITHOUT PRESENCE OF ULCERS  *URINARY PROBLEMS  *BOWEL PROBLEMS  UNUSUAL RASH Items with * indicate a potential emergency and should be followed up as soon as possible.  Feel free to call the clinic should you have any questions or concerns. The clinic phone number is [phone].  Please show the CHEMO ALERT CARD at check-in to the Emergency Department and triage nurse.   

## 2020-03-23 ENCOUNTER — Inpatient Hospital Stay: Payer: Medicare Other

## 2020-03-23 ENCOUNTER — Other Ambulatory Visit: Payer: Self-pay

## 2020-03-23 VITALS — BP 136/57 | HR 70 | Temp 98.3°F | Resp 18

## 2020-03-23 DIAGNOSIS — C3491 Malignant neoplasm of unspecified part of right bronchus or lung: Secondary | ICD-10-CM

## 2020-03-23 DIAGNOSIS — Z79899 Other long term (current) drug therapy: Secondary | ICD-10-CM | POA: Diagnosis not present

## 2020-03-23 DIAGNOSIS — Z5112 Encounter for antineoplastic immunotherapy: Secondary | ICD-10-CM | POA: Diagnosis not present

## 2020-03-23 DIAGNOSIS — C7802 Secondary malignant neoplasm of left lung: Secondary | ICD-10-CM | POA: Diagnosis not present

## 2020-03-23 DIAGNOSIS — C7951 Secondary malignant neoplasm of bone: Secondary | ICD-10-CM | POA: Diagnosis not present

## 2020-03-23 DIAGNOSIS — Z5189 Encounter for other specified aftercare: Secondary | ICD-10-CM | POA: Diagnosis not present

## 2020-03-23 DIAGNOSIS — Z5111 Encounter for antineoplastic chemotherapy: Secondary | ICD-10-CM | POA: Diagnosis not present

## 2020-03-23 DIAGNOSIS — E039 Hypothyroidism, unspecified: Secondary | ICD-10-CM | POA: Diagnosis not present

## 2020-03-23 DIAGNOSIS — C3411 Malignant neoplasm of upper lobe, right bronchus or lung: Secondary | ICD-10-CM | POA: Diagnosis not present

## 2020-03-23 DIAGNOSIS — Z452 Encounter for adjustment and management of vascular access device: Secondary | ICD-10-CM | POA: Diagnosis not present

## 2020-03-23 MED ORDER — SODIUM CHLORIDE 0.9% FLUSH
10.0000 mL | INTRAVENOUS | Status: DC | PRN
  Administered 2020-03-23: 10 mL
  Filled 2020-03-23: qty 10

## 2020-03-23 MED ORDER — HEPARIN SOD (PORK) LOCK FLUSH 100 UNIT/ML IV SOLN
500.0000 [IU] | Freq: Once | INTRAVENOUS | Status: AC | PRN
  Administered 2020-03-23: 500 [IU]
  Filled 2020-03-23: qty 5

## 2020-03-23 MED ORDER — SODIUM CHLORIDE 0.9 % IV SOLN
100.0000 mg/m2 | Freq: Once | INTRAVENOUS | Status: AC
  Administered 2020-03-23: 150 mg via INTRAVENOUS
  Filled 2020-03-23: qty 7.5

## 2020-03-23 MED ORDER — SODIUM CHLORIDE 0.9 % IV SOLN
10.0000 mg | Freq: Once | INTRAVENOUS | Status: AC
  Administered 2020-03-23: 10 mg via INTRAVENOUS
  Filled 2020-03-23: qty 10

## 2020-03-23 MED ORDER — SODIUM CHLORIDE 0.9 % IV SOLN
Freq: Once | INTRAVENOUS | Status: AC
  Filled 2020-03-23: qty 250

## 2020-03-23 NOTE — Patient Instructions (Signed)
[facility] Discharge Instructions for Patients Receiving Chemotherapy  Today you received the following chemotherapy agents: Etoposide  To help prevent nausea and vomiting after your treatment, we encourage you to take your nausea medication as prescribed.    If you develop nausea and vomiting that is not controlled by your nausea medication, call the clinic.   BELOW ARE SYMPTOMS THAT SHOULD BE REPORTED IMMEDIATELY:  *FEVER GREATER THAN 100.5 F  *CHILLS WITH OR WITHOUT FEVER  NAUSEA AND VOMITING THAT IS NOT CONTROLLED WITH YOUR NAUSEA MEDICATION  *UNUSUAL SHORTNESS OF BREATH  *UNUSUAL BRUISING OR BLEEDING  TENDERNESS IN MOUTH AND THROAT WITH OR WITHOUT PRESENCE OF ULCERS  *URINARY PROBLEMS  *BOWEL PROBLEMS  UNUSUAL RASH Items with * indicate a potential emergency and should be followed up as soon as possible.  Feel free to call the clinic should you have any questions or concerns. The clinic phone number is [phone].  Please show the [place] at check-in to the Emergency Department and triage nurse.

## 2020-03-24 ENCOUNTER — Telehealth: Payer: Self-pay | Admitting: Internal Medicine

## 2020-03-24 NOTE — Telephone Encounter (Signed)
Scheduled per los. Called and spoke with patients daughter. Confirmed appt

## 2020-03-25 ENCOUNTER — Other Ambulatory Visit: Payer: Self-pay

## 2020-03-25 ENCOUNTER — Inpatient Hospital Stay: Payer: Medicare Other

## 2020-03-25 VITALS — BP 152/49 | HR 72 | Temp 98.1°F | Resp 18

## 2020-03-25 DIAGNOSIS — C3411 Malignant neoplasm of upper lobe, right bronchus or lung: Secondary | ICD-10-CM | POA: Diagnosis not present

## 2020-03-25 DIAGNOSIS — Z79899 Other long term (current) drug therapy: Secondary | ICD-10-CM | POA: Diagnosis not present

## 2020-03-25 DIAGNOSIS — C3491 Malignant neoplasm of unspecified part of right bronchus or lung: Secondary | ICD-10-CM

## 2020-03-25 DIAGNOSIS — Z452 Encounter for adjustment and management of vascular access device: Secondary | ICD-10-CM | POA: Diagnosis not present

## 2020-03-25 DIAGNOSIS — C7951 Secondary malignant neoplasm of bone: Secondary | ICD-10-CM | POA: Diagnosis not present

## 2020-03-25 DIAGNOSIS — E039 Hypothyroidism, unspecified: Secondary | ICD-10-CM | POA: Diagnosis not present

## 2020-03-25 DIAGNOSIS — Z5111 Encounter for antineoplastic chemotherapy: Secondary | ICD-10-CM | POA: Diagnosis not present

## 2020-03-25 DIAGNOSIS — C7802 Secondary malignant neoplasm of left lung: Secondary | ICD-10-CM | POA: Diagnosis not present

## 2020-03-25 DIAGNOSIS — Z5112 Encounter for antineoplastic immunotherapy: Secondary | ICD-10-CM | POA: Diagnosis not present

## 2020-03-25 DIAGNOSIS — Z5189 Encounter for other specified aftercare: Secondary | ICD-10-CM | POA: Diagnosis not present

## 2020-03-25 MED ORDER — PEGFILGRASTIM-CBQV 6 MG/0.6ML ~~LOC~~ SOSY
PREFILLED_SYRINGE | SUBCUTANEOUS | Status: AC
  Filled 2020-03-25: qty 0.6

## 2020-03-25 MED ORDER — PEGFILGRASTIM-CBQV 6 MG/0.6ML ~~LOC~~ SOSY
6.0000 mg | PREFILLED_SYRINGE | Freq: Once | SUBCUTANEOUS | Status: AC
  Administered 2020-03-25: 6 mg via SUBCUTANEOUS

## 2020-03-28 ENCOUNTER — Inpatient Hospital Stay: Payer: Medicare Other

## 2020-03-28 ENCOUNTER — Other Ambulatory Visit: Payer: Self-pay | Admitting: Family Medicine

## 2020-03-28 ENCOUNTER — Telehealth: Payer: Self-pay | Admitting: Internal Medicine

## 2020-03-28 ENCOUNTER — Telehealth: Payer: Self-pay | Admitting: Medical Oncology

## 2020-03-28 ENCOUNTER — Other Ambulatory Visit: Payer: Self-pay

## 2020-03-28 ENCOUNTER — Other Ambulatory Visit: Payer: Self-pay | Admitting: Medical Oncology

## 2020-03-28 DIAGNOSIS — C7802 Secondary malignant neoplasm of left lung: Secondary | ICD-10-CM | POA: Diagnosis not present

## 2020-03-28 DIAGNOSIS — C3491 Malignant neoplasm of unspecified part of right bronchus or lung: Secondary | ICD-10-CM

## 2020-03-28 DIAGNOSIS — J42 Unspecified chronic bronchitis: Secondary | ICD-10-CM

## 2020-03-28 DIAGNOSIS — C3411 Malignant neoplasm of upper lobe, right bronchus or lung: Secondary | ICD-10-CM | POA: Diagnosis not present

## 2020-03-28 DIAGNOSIS — Z95828 Presence of other vascular implants and grafts: Secondary | ICD-10-CM

## 2020-03-28 DIAGNOSIS — C7951 Secondary malignant neoplasm of bone: Secondary | ICD-10-CM | POA: Diagnosis not present

## 2020-03-28 DIAGNOSIS — Z5189 Encounter for other specified aftercare: Secondary | ICD-10-CM | POA: Diagnosis not present

## 2020-03-28 DIAGNOSIS — Z5112 Encounter for antineoplastic immunotherapy: Secondary | ICD-10-CM | POA: Diagnosis not present

## 2020-03-28 DIAGNOSIS — E039 Hypothyroidism, unspecified: Secondary | ICD-10-CM | POA: Diagnosis not present

## 2020-03-28 DIAGNOSIS — Z5111 Encounter for antineoplastic chemotherapy: Secondary | ICD-10-CM | POA: Diagnosis not present

## 2020-03-28 DIAGNOSIS — D649 Anemia, unspecified: Secondary | ICD-10-CM

## 2020-03-28 DIAGNOSIS — Z79899 Other long term (current) drug therapy: Secondary | ICD-10-CM | POA: Diagnosis not present

## 2020-03-28 DIAGNOSIS — Z452 Encounter for adjustment and management of vascular access device: Secondary | ICD-10-CM | POA: Diagnosis not present

## 2020-03-28 LAB — CMP (CANCER CENTER ONLY)
ALT: 21 U/L (ref 0–44)
AST: 9 U/L — ABNORMAL LOW (ref 15–41)
Albumin: 3.2 g/dL — ABNORMAL LOW (ref 3.5–5.0)
Alkaline Phosphatase: 124 U/L (ref 38–126)
Anion gap: 9 (ref 5–15)
BUN: 14 mg/dL (ref 8–23)
CO2: 26 mmol/L (ref 22–32)
Calcium: 8.4 mg/dL — ABNORMAL LOW (ref 8.9–10.3)
Chloride: 108 mmol/L (ref 98–111)
Creatinine: 0.61 mg/dL (ref 0.44–1.00)
GFR, Est AFR Am: >60 mL/min (ref >60)
GFR, Estimated: >60 mL/min (ref >60)
Glucose, Bld: 93 mg/dL (ref 70–99)
Potassium: 3.9 mmol/L (ref 3.5–5.1)
Sodium: 143 mmol/L (ref 135–145)
Total Bilirubin: 0.3 mg/dL (ref 0.3–1.2)
Total Protein: 5.7 g/dL — ABNORMAL LOW (ref 6.5–8.1)

## 2020-03-28 LAB — CBC WITH DIFFERENTIAL (CANCER CENTER ONLY)
Abs Immature Granulocytes: 0 10*3/uL (ref 0.00–0.07)
Basophils Absolute: 0.2 10*3/uL — ABNORMAL HIGH (ref 0.0–0.1)
Basophils Relative: 1 %
Eosinophils Absolute: 0 10*3/uL (ref 0.0–0.5)
Eosinophils Relative: 0 %
HCT: 22.6 % — ABNORMAL LOW (ref 36.0–46.0)
Hemoglobin: 7.2 g/dL — ABNORMAL LOW (ref 12.0–15.0)
Immature Granulocytes: 0 %
Lymphocytes Relative: 5 %
Lymphs Abs: 1.5 10*3/uL (ref 0.7–4.0)
MCH: 29.8 pg (ref 26.0–34.0)
MCHC: 31.9 g/dL (ref 30.0–36.0)
MCV: 93.4 fL (ref 80.0–100.0)
Monocytes Absolute: 0.3 10*3/uL (ref 0.1–1.0)
Monocytes Relative: 1 %
Neutro Abs: 29.8 10*3/uL — ABNORMAL HIGH (ref 1.7–7.7)
Neutrophils Relative %: 93 %
Platelet Count: 187 10*3/uL (ref 150–400)
RBC: 2.42 MIL/uL — ABNORMAL LOW (ref 3.87–5.11)
RDW: 19.3 % — ABNORMAL HIGH (ref 11.5–15.5)
WBC Count: 32.1 10*3/uL — ABNORMAL HIGH (ref 4.0–10.5)
nRBC: 0 % (ref 0.0–0.2)

## 2020-03-28 MED ORDER — SODIUM CHLORIDE 0.9% FLUSH
10.0000 mL | INTRAVENOUS | Status: DC | PRN
  Administered 2020-03-28: 10 mL via INTRAVENOUS
  Filled 2020-03-28: qty 10

## 2020-03-28 MED ORDER — HEPARIN SOD (PORK) LOCK FLUSH 100 UNIT/ML IV SOLN
500.0000 [IU] | Freq: Once | INTRAVENOUS | Status: AC
  Administered 2020-03-28: 500 [IU] via INTRAVENOUS
  Filled 2020-03-28: qty 5

## 2020-03-28 NOTE — Telephone Encounter (Signed)
[name] notified pt needs blood transfusion. Schedule message sent.

## 2020-03-28 NOTE — Telephone Encounter (Signed)
Scheduled per  sch msg. Called and spoke with patients daughter. Confirmed appt

## 2020-03-28 NOTE — Patient Instructions (Signed)

## 2020-03-29 ENCOUNTER — Other Ambulatory Visit: Payer: Self-pay | Admitting: Medical Oncology

## 2020-03-29 ENCOUNTER — Inpatient Hospital Stay: Payer: Medicare Other

## 2020-03-29 ENCOUNTER — Other Ambulatory Visit: Payer: Self-pay | Admitting: Internal Medicine

## 2020-03-29 ENCOUNTER — Other Ambulatory Visit: Payer: Self-pay

## 2020-03-29 DIAGNOSIS — Z79899 Other long term (current) drug therapy: Secondary | ICD-10-CM | POA: Diagnosis not present

## 2020-03-29 DIAGNOSIS — C3411 Malignant neoplasm of upper lobe, right bronchus or lung: Secondary | ICD-10-CM | POA: Diagnosis not present

## 2020-03-29 DIAGNOSIS — D6481 Anemia due to antineoplastic chemotherapy: Secondary | ICD-10-CM

## 2020-03-29 DIAGNOSIS — T451X5A Adverse effect of antineoplastic and immunosuppressive drugs, initial encounter: Secondary | ICD-10-CM

## 2020-03-29 DIAGNOSIS — C3491 Malignant neoplasm of unspecified part of right bronchus or lung: Secondary | ICD-10-CM

## 2020-03-29 DIAGNOSIS — Z5189 Encounter for other specified aftercare: Secondary | ICD-10-CM | POA: Diagnosis not present

## 2020-03-29 DIAGNOSIS — D649 Anemia, unspecified: Secondary | ICD-10-CM

## 2020-03-29 DIAGNOSIS — Z95828 Presence of other vascular implants and grafts: Secondary | ICD-10-CM

## 2020-03-29 DIAGNOSIS — E039 Hypothyroidism, unspecified: Secondary | ICD-10-CM | POA: Diagnosis not present

## 2020-03-29 DIAGNOSIS — Z452 Encounter for adjustment and management of vascular access device: Secondary | ICD-10-CM | POA: Diagnosis not present

## 2020-03-29 DIAGNOSIS — C7802 Secondary malignant neoplasm of left lung: Secondary | ICD-10-CM | POA: Diagnosis not present

## 2020-03-29 DIAGNOSIS — Z5111 Encounter for antineoplastic chemotherapy: Secondary | ICD-10-CM | POA: Diagnosis not present

## 2020-03-29 DIAGNOSIS — C7951 Secondary malignant neoplasm of bone: Secondary | ICD-10-CM | POA: Diagnosis not present

## 2020-03-29 DIAGNOSIS — Z5112 Encounter for antineoplastic immunotherapy: Secondary | ICD-10-CM | POA: Diagnosis not present

## 2020-03-29 LAB — PREPARE RBC (CROSSMATCH)

## 2020-03-29 LAB — ABO/RH: ABO/RH(D): O POS

## 2020-03-29 MED ORDER — HEPARIN SOD (PORK) LOCK FLUSH 100 UNIT/ML IV SOLN
500.0000 [IU] | Freq: Every day | INTRAVENOUS | Status: AC | PRN
  Administered 2020-03-29: 500 [IU]
  Filled 2020-03-29: qty 5

## 2020-03-29 MED ORDER — SODIUM CHLORIDE 0.9% IV SOLUTION
250.0000 mL | Freq: Once | INTRAVENOUS | Status: AC
  Administered 2020-03-29: 250 mL via INTRAVENOUS
  Filled 2020-03-29: qty 250

## 2020-03-29 MED ORDER — DIPHENHYDRAMINE HCL 25 MG PO CAPS
25.0000 mg | ORAL_CAPSULE | Freq: Once | ORAL | Status: AC
  Administered 2020-03-29: 25 mg via ORAL

## 2020-03-29 MED ORDER — SODIUM CHLORIDE 0.9% FLUSH
10.0000 mL | INTRAVENOUS | Status: DC | PRN
  Administered 2020-03-29: 10 mL via INTRAVENOUS
  Filled 2020-03-29: qty 10

## 2020-03-29 MED ORDER — ACETAMINOPHEN 325 MG PO TABS
ORAL_TABLET | ORAL | Status: AC
  Filled 2020-03-29: qty 2

## 2020-03-29 MED ORDER — DIPHENHYDRAMINE HCL 25 MG PO CAPS
ORAL_CAPSULE | ORAL | Status: AC
  Filled 2020-03-29: qty 1

## 2020-03-29 MED ORDER — ACETAMINOPHEN 325 MG PO TABS
650.0000 mg | ORAL_TABLET | Freq: Once | ORAL | Status: AC
  Administered 2020-03-29: 650 mg via ORAL

## 2020-03-29 MED ORDER — SODIUM CHLORIDE 0.9% FLUSH
10.0000 mL | INTRAVENOUS | Status: AC | PRN
  Administered 2020-03-29: 10 mL
  Filled 2020-03-29: qty 10

## 2020-03-29 NOTE — Progress Notes (Signed)
Faxed order from 6/22 to [employer] for rolling walker.

## 2020-03-29 NOTE — Patient Instructions (Signed)

## 2020-03-29 NOTE — Progress Notes (Signed)
Per Dr Julien Nordmann, ok to run second unit of pRBCs at 300/hr after initial 15 minutes.

## 2020-03-30 ENCOUNTER — Ambulatory Visit: Payer: Medicare Other | Admitting: Family Medicine

## 2020-03-30 DIAGNOSIS — M6281 Muscle weakness (generalized): Secondary | ICD-10-CM | POA: Diagnosis not present

## 2020-03-30 LAB — TYPE AND SCREEN
ABO/RH(D): O POS
Antibody Screen: NEGATIVE
Unit division: 0
Unit division: 0

## 2020-03-30 LAB — BPAM RBC
Blood Product Expiration Date: 202107282359
Blood Product Expiration Date: 202107282359
ISSUE DATE / TIME: 202106291312
ISSUE DATE / TIME: 202106291312
Unit Type and Rh: 5100
Unit Type and Rh: 5100

## 2020-03-31 ENCOUNTER — Other Ambulatory Visit: Payer: Self-pay | Admitting: Family Medicine

## 2020-04-01 ENCOUNTER — Encounter: Payer: Self-pay | Admitting: General Practice

## 2020-04-01 NOTE — Progress Notes (Signed)
[facility] CSW Progress Notes  Discussed patient's need for help w affording cost of gas for daughter to transport her to treatment.  Completed referral to [name] [employer] [facility] gas card program, sent to agency via secure email.  Explained that program mails gas cards to the home address, spoke w patient and her daughter.  Edwyna Shell, LCSW Clinical Social Worker Phone:  [phone]

## 2020-04-05 ENCOUNTER — Other Ambulatory Visit: Payer: Self-pay

## 2020-04-05 ENCOUNTER — Inpatient Hospital Stay: Payer: Medicare Other | Attending: Internal Medicine

## 2020-04-05 ENCOUNTER — Inpatient Hospital Stay: Payer: Medicare Other

## 2020-04-05 DIAGNOSIS — C7951 Secondary malignant neoplasm of bone: Secondary | ICD-10-CM | POA: Insufficient documentation

## 2020-04-05 DIAGNOSIS — I10 Essential (primary) hypertension: Secondary | ICD-10-CM | POA: Diagnosis not present

## 2020-04-05 DIAGNOSIS — Z79899 Other long term (current) drug therapy: Secondary | ICD-10-CM | POA: Diagnosis not present

## 2020-04-05 DIAGNOSIS — K529 Noninfective gastroenteritis and colitis, unspecified: Secondary | ICD-10-CM | POA: Insufficient documentation

## 2020-04-05 DIAGNOSIS — Z452 Encounter for adjustment and management of vascular access device: Secondary | ICD-10-CM | POA: Diagnosis not present

## 2020-04-05 DIAGNOSIS — I2510 Atherosclerotic heart disease of native coronary artery without angina pectoris: Secondary | ICD-10-CM | POA: Insufficient documentation

## 2020-04-05 DIAGNOSIS — C7800 Secondary malignant neoplasm of unspecified lung: Secondary | ICD-10-CM | POA: Insufficient documentation

## 2020-04-05 DIAGNOSIS — C3491 Malignant neoplasm of unspecified part of right bronchus or lung: Secondary | ICD-10-CM

## 2020-04-05 DIAGNOSIS — J449 Chronic obstructive pulmonary disease, unspecified: Secondary | ICD-10-CM | POA: Diagnosis not present

## 2020-04-05 DIAGNOSIS — Z5112 Encounter for antineoplastic immunotherapy: Secondary | ICD-10-CM | POA: Diagnosis not present

## 2020-04-05 DIAGNOSIS — E119 Type 2 diabetes mellitus without complications: Secondary | ICD-10-CM | POA: Insufficient documentation

## 2020-04-05 DIAGNOSIS — I480 Paroxysmal atrial fibrillation: Secondary | ICD-10-CM | POA: Insufficient documentation

## 2020-04-05 DIAGNOSIS — Z5111 Encounter for antineoplastic chemotherapy: Secondary | ICD-10-CM | POA: Insufficient documentation

## 2020-04-05 DIAGNOSIS — Z95828 Presence of other vascular implants and grafts: Secondary | ICD-10-CM

## 2020-04-05 DIAGNOSIS — C3411 Malignant neoplasm of upper lobe, right bronchus or lung: Secondary | ICD-10-CM | POA: Insufficient documentation

## 2020-04-05 DIAGNOSIS — Z5189 Encounter for other specified aftercare: Secondary | ICD-10-CM | POA: Insufficient documentation

## 2020-04-05 LAB — CMP (CANCER CENTER ONLY)
ALT: 17 U/L (ref 0–44)
AST: 15 U/L (ref 15–41)
Albumin: 3.3 g/dL — ABNORMAL LOW (ref 3.5–5.0)
Alkaline Phosphatase: 100 U/L (ref 38–126)
Anion gap: 8 (ref 5–15)
BUN: 9 mg/dL (ref 8–23)
CO2: 24 mmol/L (ref 22–32)
Calcium: 9.1 mg/dL (ref 8.9–10.3)
Chloride: 109 mmol/L (ref 98–111)
Creatinine: 0.81 mg/dL (ref 0.44–1.00)
GFR, Est AFR Am: >60 mL/min (ref >60)
GFR, Estimated: >60 mL/min (ref >60)
Glucose, Bld: 92 mg/dL (ref 70–99)
Potassium: 4.1 mmol/L (ref 3.5–5.1)
Sodium: 141 mmol/L (ref 135–145)
Total Bilirubin: 0.3 mg/dL (ref 0.3–1.2)
Total Protein: 6.4 g/dL — ABNORMAL LOW (ref 6.5–8.1)

## 2020-04-05 LAB — CBC WITH DIFFERENTIAL (CANCER CENTER ONLY)
Abs Immature Granulocytes: 0.41 10*3/uL — ABNORMAL HIGH (ref 0.00–0.07)
Basophils Absolute: 0.1 10*3/uL (ref 0.0–0.1)
Basophils Relative: 1 %
Eosinophils Absolute: 0.1 10*3/uL (ref 0.0–0.5)
Eosinophils Relative: 1 %
HCT: 33.7 % — ABNORMAL LOW (ref 36.0–46.0)
Hemoglobin: 10.5 g/dL — ABNORMAL LOW (ref 12.0–15.0)
Immature Granulocytes: 3 %
Lymphocytes Relative: 15 %
Lymphs Abs: 1.9 10*3/uL (ref 0.7–4.0)
MCH: 28.8 pg (ref 26.0–34.0)
MCHC: 31.2 g/dL (ref 30.0–36.0)
MCV: 92.6 fL (ref 80.0–100.0)
Monocytes Absolute: 0.9 10*3/uL (ref 0.1–1.0)
Monocytes Relative: 7 %
Neutro Abs: 9.1 10*3/uL — ABNORMAL HIGH (ref 1.7–7.7)
Neutrophils Relative %: 73 %
Platelet Count: 90 10*3/uL — ABNORMAL LOW (ref 150–400)
RBC: 3.64 MIL/uL — ABNORMAL LOW (ref 3.87–5.11)
RDW: 17.8 % — ABNORMAL HIGH (ref 11.5–15.5)
WBC Count: 12.5 10*3/uL — ABNORMAL HIGH (ref 4.0–10.5)
nRBC: 0 % (ref 0.0–0.2)

## 2020-04-05 MED ORDER — HEPARIN SOD (PORK) LOCK FLUSH 100 UNIT/ML IV SOLN
500.0000 [IU] | Freq: Once | INTRAVENOUS | Status: AC
  Administered 2020-04-05: 500 [IU] via INTRAVENOUS
  Filled 2020-04-05: qty 5

## 2020-04-05 MED ORDER — SODIUM CHLORIDE 0.9% FLUSH
10.0000 mL | INTRAVENOUS | Status: DC | PRN
  Administered 2020-04-05: 10 mL via INTRAVENOUS
  Filled 2020-04-05: qty 10

## 2020-04-08 NOTE — Progress Notes (Signed)
[place] OFFICE PROGRESS NOTE  [name], [name], MD [place] [address]  DIAGNOSIS: Extensive stage (T2 a, N2, M1 C) small cell lung cancer presented with central right upper lobe lung mass in addition to left lower lobe pulmonary metastasis and bilateral hilar, subcarinal and bilateral paratracheal and left prevascular lymphadenopathy in addition to bone metastasis in the left iliac wing diagnosed in April 2021.  PRIOR THERAPY: None   CURRENT THERAPY: Systemic chemotherapy with carboplatin for AUC of 5 on day 1, etoposide 100 mg/M2 on days 1, 2 and 3 with Neulasta support in addition to Imfinzi 1500 mg IV every 3 weeks during chemotherapy followed by maintenance Imfinzi 1500 mg IV every 4 weeks after cycle #4.Status post 3 cycles.  INTERVAL HISTORY: [name] 85 y.o. female returns to the clinic today for a  follow-up visit accompanied by her daughter.  The patient is feeling well today without any concerning complaints.  She continues to tolerate treatment well without any concerning adverse side effects. She denies any recent fever, chills, or night sweats. She lost 3 lbs since her last appointment. She denies any recent cough or hemoptysis. Her breathing/dyspnea on exertion has been good "lately". She denies any vomiting or constipation.  She has a history of chronic diarrhea for which she takes imodium occassionally. She typically takes 2 per day if needed. She denies blood in the stool. She states that she can have anywhere to 1-4 loose stools daily depending on what she eats. She also note some indigestion for 5 minutes or so after she eats. She localizes her pain to the epigastric region. She takes Protonix. She denies any headache or visual changes. She received a blood transfusion in the interval since her last cycle of treatment.  She is here today for evaluation before starting cycle #4.  MEDICAL HISTORY: Past Medical History:  Diagnosis Date   . CAD (coronary artery disease), native coronary artery    PTCA of distal right coronary artery 1997 Cypher stents to circumflex 2005 Cardiac cath in 2011 with patent stent to circumflex and moderate disease elsewhere treated medically   . Cancer ([place])   . Cardiac pacemaker in situ 12/24/2015   Original implant reportedly in 1991 for tachybradycardia syndrome, generator change in 1999, 2004 and evidently again in 2016 in [place]   . COPD (chronic obstructive pulmonary disease) ([place])   . Diabetes mellitus without complication ([place])   . Hypertension   . Hypothyroidism 03/23/2010  . Pacemaker   . Paroxysmal atrial fibrillation ([place]) 03/23/2010   CHA2DS2VASC score 5     ALLERGIES:  has No Known Allergies.  MEDICATIONS:  Current Outpatient Medications  Medication Sig Dispense Refill  . acetaminophen (TYLENOL) 500 MG tablet Take 500 mg by mouth every 6 (six) hours as needed for headache.    . albuterol (VENTOLIN HFA) 108 (90 Base) MCG/ACT inhaler TAKE 2 PUFFS BY MOUTH EVERY 6 HOURS AS NEEDED FOR WHEEZE OR SHORTNESS OF BREATH 8.5 g 1  . atorvastatin (LIPITOR) 10 MG tablet TAKE 1 TABLET BY MOUTH EVERY DAY 90 tablet 3  . carvedilol (COREG) 12.5 MG tablet TAKE 1 TABLET (12.5 MG TOTAL) BY MOUTH 2 (TWO) TIMES DAILY WITH A MEAL. 180 tablet 2  . [employer] NICOTINE TRANSDERMAL SYS 14 MG/24HR patch PLACE 1 PATCH (14 MG TOTAL) ONTO THE SKIN DAILY. 28 patch 0  . diclofenac sodium (VOLTAREN) 1 % GEL Apply 4 g topically 4 (four) times daily. (Patient taking differently: Apply  4 g topically 4 (four) times daily as needed (pain). ) 4 Tube 3  . dicyclomine (BENTYL) 10 MG capsule TAKE 1 CAPSULE (10 MG TOTAL) BY MOUTH 4 (FOUR) TIMES DAILY - BEFORE MEALS AND AT BEDTIME. 90 capsule 0  . fenofibrate (TRICOR) 48 MG tablet TAKE 1 TABLET BY MOUTH EVERY DAY (Patient taking differently: Take 48 mg by mouth daily after lunch. ) 90 tablet 3  . glucose blood (ONETOUCH VERIO) test strip Use to test 3-4 times daily. 300 strip 4   . levothyroxine (SYNTHROID) 112 MCG tablet TAKE 1 TABLET (112 MCG TOTAL) BY MOUTH DAILY BEFORE BREAKFAST. 90 tablet 2  . lidocaine-prilocaine (EMLA) cream Apply 1 application topically as needed. 30 g 2  . linagliptin (TRADJENTA) 5 MG TABS tablet TAKE 1 TABLET (5 MG TOTAL) DAILY BY MOUTH. (Patient taking differently: Take 5 mg by mouth daily after breakfast. ) 90 tablet 1  . losartan (COZAAR) 50 MG tablet Take 2 tablets by mouth daily. (Patient taking differently: Take 100 mg by mouth daily. ) 180 tablet 1  . mupirocin ointment (BACTROBAN) 2 % Apply 1 application topically 2 (two) times daily. 22 g 0  . nitroGLYCERIN (NITROSTAT) 0.4 MG SL tablet Place 1 tablet (0.4 mg total) under the tongue every 5 (five) minutes x 3 doses as needed for chest pain (Max 3 doses within 15 min. Call 911). 10 tablet 0  . oxyCODONE-acetaminophen (PERCOCET/ROXICET) 5-325 MG tablet Take 1 tablet by mouth every 8 (eight) hours as needed for severe pain. 20 tablet 0  . pantoprazole (PROTONIX) 40 MG tablet TAKE 1 TABLET BY MOUTH EVERY DAY 90 tablet 1  . potassium chloride SA (KLOR-CON M20) 20 MEQ tablet TAKE 1 TABLET BY MOUTH TWICE A DAY FOR 2 DAYS THEN TAKE 1 TABLET BY MOUTH EVERY DAY 90 tablet 1  . predniSONE (DELTASONE) 10 MG tablet 5 tab x 1 day, 4 tab x 1 day, 3 tab x 1 day, 2 tab x 1 day, 1 tab x 1 day, stop 15 tablet 0  . prochlorperazine (COMPAZINE) 10 MG tablet Take 1 tablet (10 mg total) by mouth every 6 (six) hours as needed for nausea or vomiting. 30 tablet 1  . XARELTO 15 MG TABS tablet TAKE 1 TABLET BY MOUTH DAILY WITH SUPPER. (Patient taking differently: Take 15 mg by mouth daily with supper. ) 30 tablet 3   No current facility-administered medications for this visit.    SURGICAL HISTORY:  Past Surgical History:  Procedure Laterality Date  . BIOPSY  01/08/2020   Procedure: BIOPSY;  Surgeon: Garner Nash, DO;  Location: [facility];  Service: Pulmonary;;  . BRONCHIAL BRUSHINGS  01/08/2020   Procedure:  BRONCHIAL BRUSHINGS;  Surgeon: Garner Nash, DO;  Location: [facility];  Service: Pulmonary;;  . BRONCHIAL WASHINGS  01/08/2020   Procedure: BRONCHIAL WASHINGS;  Surgeon: Garner Nash, DO;  Location: [facility];  Service: Pulmonary;;  . CARDIAC CATHETERIZATION N/A 12/26/2015   Procedure: Left Heart Cath and Coronary Angiography;  Surgeon: Peter M [name], MD;  Location: [facility];  Service: Cardiovascular;  Laterality: N/A;  . CARDIOVERSION  05/2015  . CHOLECYSTECTOMY  2012  . CORONARY ANGIOPLASTY WITH STENT PLACEMENT  1990  . ENDOBRONCHIAL ULTRASOUND  01/08/2020   Procedure: ENDOBRONCHIAL ULTRASOUND;  Surgeon: Garner Nash, DO;  Location: [facility];  Service: Pulmonary;;  . FINE NEEDLE ASPIRATION BIOPSY  01/08/2020   Procedure: FINE NEEDLE ASPIRATION BIOPSY;  Surgeon: Garner Nash, DO;  Location: [facility];  Service: Pulmonary;;  . HERNIA REPAIR    . IR IMAGING GUIDED PORT INSERTION  02/18/2020  . PACEMAKER GENERATOR CHANGE  [id number], 2016  . PACEMAKER INSERTION  1991  . VIDEO BRONCHOSCOPY WITH ENDOBRONCHIAL ULTRASOUND N/A 01/08/2020   Procedure: VIDEO BRONCHOSCOPY;  Surgeon: Garner Nash, DO;  [facility];  Service: Pulmonary;  Laterality: N/A;    REVIEW OF SYSTEMS:   Review of Systems  Constitutional: Positive for weight loss. Negative for appetite change, chills, fatigue, and fever.  HENT: Negative for mouth sores, nosebleeds, sore throat and trouble swallowing.   Eyes: Negative for eye problems and icterus.  Respiratory: Negative for cough, hemoptysis, shortness of breath and wheezing.   Cardiovascular: Negative for chest pain and leg swelling.  Gastrointestinal: Positive for chronic diarrhea. Negative for abdominal pain, constipation, nausea and vomiting.  Genitourinary: Negative for bladder incontinence, difficulty urinating, dysuria, frequency and hematuria.   Musculoskeletal: Negative for back pain, gait problem, neck pain and neck  stiffness.  Skin: Negative for itching and rash.  Neurological: Negative for dizziness, extremity weakness, gait problem, headaches, light-headedness and seizures.  Hematological: Negative for adenopathy. Does not bruise/bleed easily.  Psychiatric/Behavioral: Negative for confusion, depression and sleep disturbance. The patient is not nervous/anxious.     PHYSICAL EXAMINATION:  Pulse 76, temperature 97.6 F (36.4 C), temperature source Temporal, resp. rate 18, height 5\' 2"  (1.575 m), weight 113 lb 3.2 oz (51.3 kg), SpO2 100 %.  ECOG PERFORMANCE STATUS: 1 - Symptomatic but completely ambulatory  Physical Exam  Constitutional: Oriented to person, place, and time and well-developed, well-nourished, and in no distress.  HENT:  Head: Normocephalic and atraumatic.  Mouth/Throat: Oropharynx is clear and moist. No oropharyngeal exudate.  Eyes: Conjunctivae are normal. Right eye exhibits no discharge. Left eye exhibits no discharge. No scleral icterus.  Neck: Normal range of motion. Neck supple.  Cardiovascular: Normal rate, regular rhythm, normal heart sounds and intact distal pulses.   Pulmonary/Chest: Effort normal. Quiet breath sounds in all lung fields. No respiratory distress. No wheezes. No rales.  Abdominal: Soft. Bowel sounds are normal. Exhibits no distension and no mass. There is no tenderness.  Musculoskeletal: Normal range of motion. Exhibits no edema.  Lymphadenopathy:    No cervical adenopathy.  Neurological: Alert and oriented to person, place, and time. Exhibits normal muscle tone. Examined in the wheelchair.  Skin: Skin is warm and dry. No rash noted. Not diaphoretic. No erythema. No pallor.  Psychiatric: Mood, memory and judgment normal.  Vitals reviewed.  LABORATORY DATA: Lab Results  Component Value Date   WBC 15.5 (H) 04/11/2020   HGB 10.8 (L) 04/11/2020   HCT 34.3 (L) 04/11/2020   MCV 94.0 04/11/2020   PLT 205 04/11/2020      Chemistry      Component Value  Date/Time   NA 143 04/11/2020 0841   NA 143 03/17/2020 0915   K 3.9 04/11/2020 0841   CL 110 04/11/2020 0841   CO2 25 04/11/2020 0841   BUN 12 04/11/2020 0841   BUN 6 (L) 03/17/2020 0915   CREATININE 0.74 04/11/2020 0841      Component Value Date/Time   CALCIUM 9.1 04/11/2020 0841   ALKPHOS 86 04/11/2020 0841   AST 13 (L) 04/11/2020 0841   ALT 14 04/11/2020 0841   BILITOT 0.4 04/11/2020 0841       RADIOGRAPHIC STUDIES:  CT ANGIO CHEST PE W OR WO CONTRAST  Result Date: 03/16/2020 CLINICAL DATA:  86 year old female with history of shortness  of breath with hypoxia since yesterday. EXAM: CT ANGIOGRAPHY CHEST WITH CONTRAST TECHNIQUE: Multidetector CT imaging of the chest was performed using the standard protocol during bolus administration of intravenous contrast. Multiplanar CT image reconstructions and MIPs were obtained to evaluate the vascular anatomy. CONTRAST:  171mL OMNIPAQUE IOHEXOL 350 MG/ML SOLN COMPARISON:  Chest CT 02/21/2020. FINDINGS: Cardiovascular: No filling defects are noted in the pulmonary arterial tree to suggest underlying pulmonary embolism. Heart size is mildly enlarged. There is no significant pericardial fluid, thickening or pericardial calcification. There is aortic atherosclerosis, as well as atherosclerosis of the great vessels of the mediastinum and the coronary arteries, including calcified atherosclerotic plaque in the left main, left anterior descending, left circumflex and right coronary arteries. Right-sided pacemaker device in place with lead tips terminating in the right atrium and right ventricle. Mediastinum/Nodes: Multiple prominent borderline enlarged and mildly enlarged mediastinal and hilar lymph nodes are noted, largest of which include a 1.5 cm right hilar node (axial image 130 of series 6), and 2.2 cm short axis subcarinal lymph node (axial image 138 of series 6). Previously noted prevascular lymph has decreased in size, currently measuring 1 cm in  short axis. Esophagus is unremarkable in appearance. No axillary lymphadenopathy. Lungs/Pleura: 2.6 x 2.0 cm macrolobulated slightly spiculated nodule in the central aspect of the right upper lobe (axial image 59 of series 7), similar to the prior study. Ovoid shaped left lower lobe nodule measuring 1.4 x 0.9 cm (axial image 76 of series 7), slightly decreased compared to the prior examination. Small right pleural effusion lying dependently with some associated areas of passive atelectasis in the dependent portions of the right lung. No acute consolidative airspace disease. No left pleural effusion. Diffuse bronchial wall thickening with moderate centrilobular and paraseptal emphysema. Upper Abdomen: Aortic atherosclerosis. Musculoskeletal: There are no aggressive appearing lytic or blastic lesions noted in the visualized portions of the skeleton. Review of the MIP images confirms the above findings. IMPRESSION: 1. No evidence of pulmonary embolism. 2. Known central right upper lobe neoplasm with mediastinal lymphadenopathy, similar to slightly regressed compared to the prior study. Additionally, there has been slight regression of the left lower lobe nodule seen on the prior examination, as detailed above. 3. Small right pleural effusion lying dependently with areas of passive atelectasis in the right lower lobe. 4. Aortic atherosclerosis, in addition to left main and 3 vessel coronary artery disease. 5. Mild cardiomegaly. 6. Diffuse bronchial wall thickening with moderate centrilobular and paraseptal emphysema; imaging findings suggestive of underlying COPD. Aortic Atherosclerosis (ICD10-I70.0) and Emphysema (ICD10-J43.9). Electronically Signed   By: Vinnie Langton M.D.   On: 03/16/2020 16:01   CT Abdomen Pelvis W Contrast  Result Date: 03/18/2020 CLINICAL DATA:  Primary Cancer Type: Lung Imaging Indication: Routine surveillance. Left iliac wing metastasis. Interval therapy since last imaging? Yes Initial  Cancer Diagnosis Date: 01/08/2020; Established by: Biopsy-proven Detailed Pathology: Extensive stage small cell lung cancer. Primary Tumor location: Central right upper lobe lung mass, left lower lobe pulmonary metastasis. Chemotherapy: Yes; Ongoing? Yes; Most recent administration: 03/05/2020 Immunotherapy?  Yes; Type: Imfinzi; Ongoing? Yes Radiation therapy? No EXAM: CT ABDOMEN AND PELVIS WITH CONTRAST TECHNIQUE: Multidetector CT imaging of the abdomen and pelvis was performed using the standard protocol following bolus administration of intravenous contrast. CONTRAST:  148mL OMNIPAQUE IOHEXOL 300 MG/ML  SOLN COMPARISON:  01/15/2020 PET-CT. CT chest, abdomen and pelvis 12/27/2019. FINDINGS: Lower chest: Small right pleural effusion is increased in volume from 12/27/2019. Nodule within the posterior left lower lobe measures  1.0 x 0.6 cm, image 7/4. On 01/15/2020 this measured 2.4 x 1.6 cm. Hepatobiliary: No focal liver abnormality is seen. Status post cholecystectomy. No biliary dilatation. Pancreas: Unremarkable. No pancreatic ductal dilatation or surrounding inflammatory changes. Spleen: Normal in size without focal abnormality. Adrenals/Urinary Tract: Normal adrenal glands. Bilateral renal vascular calcifications identified. 1.1 cm left kidney cyst. No suspicious mass or hydronephrosis identified. Urinary bladder is unremarkable. Stomach/Bowel: Stomach is within normal limits. No evidence of bowel wall thickening, distention, or inflammatory changes. Vascular/Lymphatic: Aortic atherosclerosis. No aneurysm. No abdominal or pelvic adenopathy. Reproductive: Uterus and bilateral adnexa are unremarkable. Other: No free fluid or fluid collections. Musculoskeletal: Previous ORIF of right femur. Asymmetric sclerosis within the left iliac bone adjacent to the SI joint appears unchanged. The previous FDG avid lytic lesion within the left iliac bone is not confidently identified on today's exam. No suspicious or aggressive  bone lesions identified at this time. IMPRESSION: 1. Decrease in size of index pulmonary nodule in the left lower lobe. 2. No CT correlate within the left iliac bone on today's exam 2 correspond with the previously noted FDG avid bone metastasis. 3. Small right pleural effusion is new from 01/15/2020. 4. No new sites of disease identified. Electronically Signed   By: Kerby Moors M.D.   On: 03/18/2020 10:14     ASSESSMENT/PLAN:  This is a very pleasant 85 year old [place] American female recently diagnosed with extensive stage small cell lung cancer. She presented with a central right upper lobe lung mass in addition to left lower lobe nodule and mediastinal lymphadenopathy and a metastatic bone lesion in the left iliac wing. She was diagnosed in April 2021.   The patient is currently undergoing treatment with palliative systemic chemotherapy with carboplatin for an AUC of 5 on day 1, etoposide 100 mg/m2 on days 1, 2, and 3 and Imfinzi 1500 mg every 3 weeks. She is status post 3 cycles.    Labs were reviewed. Recommend that she proceed with cycle #4 today as scheduled.   I will arrange for her restaging CT scan of her chest, abdomen, and pelvis prior to her next cycle of treatment.   We will see her back for a follow up visit in 3 weeks for evaluation before starting cycle #5.   We reviewed how to take her imodium for her chronic diarrhea. She was encouraged to stay hydrated. Discussed symptoms that would warrant her calling us back/evaluation which include worsening diarrhea not controlled by imodium, blood in the stool, fevers, abdominal pain, etc.   The patient was advised to call immediately if she has any concerning symptoms in the interval. The patient voices understanding of current disease status and treatment options and is in agreement with the current care plan. All questions were answered. The patient knows to call the clinic with any problems, questions or concerns. We can  certainly see the patient much sooner if necessary  Orders Placed This Encounter  Procedures  . CT Chest W Contrast    Standing Status:   Future    Standing Expiration Date:   04/11/2021    Order Specific Question:   ** REASON FOR EXAM (FREE TEXT)    Answer:   restaging small cell lung cancer    Order Specific Question:   If indicated for the ordered procedure, I authorize the administration of contrast media per Radiology protocol    Answer:   Yes    Order Specific Question:   Preferred imaging location?  Answer:   [facility]    Order Specific Question:   Radiology Contrast Protocol - do NOT remove file path    Answer:   \\[email or web address]  . CT Abdomen Pelvis W Contrast    Standing Status:   Future    Standing Expiration Date:   04/11/2021    Order Specific Question:   ** REASON FOR EXAM (FREE TEXT)    Answer:   restaging Lung small cell lung cancer    Order Specific Question:   If indicated for the ordered procedure, I authorize the administration of contrast media per Radiology protocol    Answer:   Yes    Order Specific Question:   Preferred imaging location?    Answer:   [facility]    Order Specific Question:   Is Oral Contrast requested for this exam?    Answer:   Yes, Per Radiology protocol    Order Specific Question:   Radiology Contrast Protocol - do NOT remove file path    Answer:   \\[email or web address]     [place], PA-C 04/11/20

## 2020-04-11 ENCOUNTER — Inpatient Hospital Stay (HOSPITAL_BASED_OUTPATIENT_CLINIC_OR_DEPARTMENT_OTHER): Payer: Medicare Other | Admitting: Physician Assistant

## 2020-04-11 ENCOUNTER — Other Ambulatory Visit: Payer: Self-pay | Admitting: Family Medicine

## 2020-04-11 ENCOUNTER — Other Ambulatory Visit: Payer: Self-pay

## 2020-04-11 ENCOUNTER — Inpatient Hospital Stay: Payer: Medicare Other

## 2020-04-11 VITALS — HR 76 | Temp 97.6°F | Resp 18 | Ht 62.0 in | Wt 113.2 lb

## 2020-04-11 VITALS — BP 149/56 | HR 61

## 2020-04-11 DIAGNOSIS — Z5111 Encounter for antineoplastic chemotherapy: Secondary | ICD-10-CM | POA: Diagnosis not present

## 2020-04-11 DIAGNOSIS — J449 Chronic obstructive pulmonary disease, unspecified: Secondary | ICD-10-CM | POA: Diagnosis not present

## 2020-04-11 DIAGNOSIS — Z5112 Encounter for antineoplastic immunotherapy: Secondary | ICD-10-CM | POA: Diagnosis not present

## 2020-04-11 DIAGNOSIS — I2510 Atherosclerotic heart disease of native coronary artery without angina pectoris: Secondary | ICD-10-CM | POA: Diagnosis not present

## 2020-04-11 DIAGNOSIS — Z79899 Other long term (current) drug therapy: Secondary | ICD-10-CM | POA: Diagnosis not present

## 2020-04-11 DIAGNOSIS — R197 Diarrhea, unspecified: Secondary | ICD-10-CM | POA: Diagnosis not present

## 2020-04-11 DIAGNOSIS — C3491 Malignant neoplasm of unspecified part of right bronchus or lung: Secondary | ICD-10-CM

## 2020-04-11 DIAGNOSIS — I10 Essential (primary) hypertension: Secondary | ICD-10-CM | POA: Diagnosis not present

## 2020-04-11 DIAGNOSIS — C7800 Secondary malignant neoplasm of unspecified lung: Secondary | ICD-10-CM | POA: Diagnosis not present

## 2020-04-11 DIAGNOSIS — C3411 Malignant neoplasm of upper lobe, right bronchus or lung: Secondary | ICD-10-CM | POA: Diagnosis not present

## 2020-04-11 DIAGNOSIS — E890 Postprocedural hypothyroidism: Secondary | ICD-10-CM

## 2020-04-11 DIAGNOSIS — Z95828 Presence of other vascular implants and grafts: Secondary | ICD-10-CM | POA: Insufficient documentation

## 2020-04-11 DIAGNOSIS — I480 Paroxysmal atrial fibrillation: Secondary | ICD-10-CM | POA: Diagnosis not present

## 2020-04-11 DIAGNOSIS — K529 Noninfective gastroenteritis and colitis, unspecified: Secondary | ICD-10-CM | POA: Diagnosis not present

## 2020-04-11 DIAGNOSIS — E119 Type 2 diabetes mellitus without complications: Secondary | ICD-10-CM | POA: Diagnosis not present

## 2020-04-11 DIAGNOSIS — Z452 Encounter for adjustment and management of vascular access device: Secondary | ICD-10-CM | POA: Diagnosis not present

## 2020-04-11 DIAGNOSIS — C7951 Secondary malignant neoplasm of bone: Secondary | ICD-10-CM | POA: Diagnosis not present

## 2020-04-11 DIAGNOSIS — Z5189 Encounter for other specified aftercare: Secondary | ICD-10-CM | POA: Diagnosis not present

## 2020-04-11 LAB — CBC WITH DIFFERENTIAL (CANCER CENTER ONLY)
Abs Immature Granulocytes: 0.34 10*3/uL — ABNORMAL HIGH (ref 0.00–0.07)
Basophils Absolute: 0.1 10*3/uL (ref 0.0–0.1)
Basophils Relative: 0 %
Eosinophils Absolute: 0.2 10*3/uL (ref 0.0–0.5)
Eosinophils Relative: 1 %
HCT: 34.3 % — ABNORMAL LOW (ref 36.0–46.0)
Hemoglobin: 10.8 g/dL — ABNORMAL LOW (ref 12.0–15.0)
Immature Granulocytes: 2 %
Lymphocytes Relative: 14 %
Lymphs Abs: 2.1 10*3/uL (ref 0.7–4.0)
MCH: 29.6 pg (ref 26.0–34.0)
MCHC: 31.5 g/dL (ref 30.0–36.0)
MCV: 94 fL (ref 80.0–100.0)
Monocytes Absolute: 1.4 10*3/uL — ABNORMAL HIGH (ref 0.1–1.0)
Monocytes Relative: 9 %
Neutro Abs: 11.5 10*3/uL — ABNORMAL HIGH (ref 1.7–7.7)
Neutrophils Relative %: 74 %
Platelet Count: 205 10*3/uL (ref 150–400)
RBC: 3.65 MIL/uL — ABNORMAL LOW (ref 3.87–5.11)
RDW: 17.6 % — ABNORMAL HIGH (ref 11.5–15.5)
WBC Count: 15.5 10*3/uL — ABNORMAL HIGH (ref 4.0–10.5)
nRBC: 0 % (ref 0.0–0.2)

## 2020-04-11 LAB — CMP (CANCER CENTER ONLY)
ALT: 14 U/L (ref 0–44)
AST: 13 U/L — ABNORMAL LOW (ref 15–41)
Albumin: 3.4 g/dL — ABNORMAL LOW (ref 3.5–5.0)
Alkaline Phosphatase: 86 U/L (ref 38–126)
Anion gap: 8 (ref 5–15)
BUN: 12 mg/dL (ref 8–23)
CO2: 25 mmol/L (ref 22–32)
Calcium: 9.1 mg/dL (ref 8.9–10.3)
Chloride: 110 mmol/L (ref 98–111)
Creatinine: 0.74 mg/dL (ref 0.44–1.00)
GFR, Est AFR Am: >60 mL/min (ref >60)
GFR, Estimated: >60 mL/min (ref >60)
Glucose, Bld: 85 mg/dL (ref 70–99)
Potassium: 3.9 mmol/L (ref 3.5–5.1)
Sodium: 143 mmol/L (ref 135–145)
Total Bilirubin: 0.4 mg/dL (ref 0.3–1.2)
Total Protein: 6.3 g/dL — ABNORMAL LOW (ref 6.5–8.1)

## 2020-04-11 LAB — TSH: TSH: 3.651 u[IU]/mL (ref 0.308–3.960)

## 2020-04-11 MED ORDER — SODIUM CHLORIDE 0.9 % IV SOLN
100.0000 mg/m2 | Freq: Once | INTRAVENOUS | Status: AC
  Administered 2020-04-11: 150 mg via INTRAVENOUS
  Filled 2020-04-11: qty 7.5

## 2020-04-11 MED ORDER — SODIUM CHLORIDE 0.9 % IV SOLN
Freq: Once | INTRAVENOUS | Status: AC
  Filled 2020-04-11: qty 250

## 2020-04-11 MED ORDER — SODIUM CHLORIDE 0.9% FLUSH
10.0000 mL | Freq: Once | INTRAVENOUS | Status: AC
  Administered 2020-04-11: 10 mL
  Filled 2020-04-11: qty 10

## 2020-04-11 MED ORDER — SODIUM CHLORIDE 0.9 % IV SOLN
294.0000 mg | Freq: Once | INTRAVENOUS | Status: AC
  Administered 2020-04-11: 290 mg via INTRAVENOUS
  Filled 2020-04-11: qty 29

## 2020-04-11 MED ORDER — SODIUM CHLORIDE 0.9 % IV SOLN
10.0000 mg | Freq: Once | INTRAVENOUS | Status: AC
  Administered 2020-04-11: 10 mg via INTRAVENOUS
  Filled 2020-04-11: qty 10

## 2020-04-11 MED ORDER — SODIUM CHLORIDE 0.9 % IV SOLN
150.0000 mg | Freq: Once | INTRAVENOUS | Status: AC
  Administered 2020-04-11: 150 mg via INTRAVENOUS
  Filled 2020-04-11: qty 150

## 2020-04-11 MED ORDER — LIDOCAINE-PRILOCAINE 2.5-2.5 % EX CREA: 1.0000 "application " | TOPICAL_CREAM | CUTANEOUS | 2 refills | Status: DC | PRN

## 2020-04-11 MED ORDER — PALONOSETRON HCL INJECTION 0.25 MG/5ML
0.2500 mg | Freq: Once | INTRAVENOUS | Status: AC
  Administered 2020-04-11: 0.25 mg via INTRAVENOUS

## 2020-04-11 MED ORDER — PALONOSETRON HCL INJECTION 0.25 MG/5ML
INTRAVENOUS | Status: AC
  Filled 2020-04-11: qty 5

## 2020-04-11 MED ORDER — SODIUM CHLORIDE 0.9 % IV SOLN
1500.0000 mg | Freq: Once | INTRAVENOUS | Status: AC
  Administered 2020-04-11: 1500 mg via INTRAVENOUS
  Filled 2020-04-11: qty 30

## 2020-04-11 MED ORDER — HEPARIN SOD (PORK) LOCK FLUSH 100 UNIT/ML IV SOLN
500.0000 [IU] | Freq: Once | INTRAVENOUS | Status: AC | PRN
  Administered 2020-04-11: 500 [IU]
  Filled 2020-04-11: qty 5

## 2020-04-11 MED ORDER — SODIUM CHLORIDE 0.9% FLUSH
10.0000 mL | INTRAVENOUS | Status: DC | PRN
  Administered 2020-04-11: 10 mL
  Filled 2020-04-11: qty 10

## 2020-04-11 NOTE — Patient Instructions (Signed)
[facility] Discharge Instructions for Patients Receiving Chemotherapy  Today you received the following chemotherapy agents: Imfinzi/Carboplatin/Etoposide.  To help prevent nausea and vomiting after your treatment, we encourage you to take your nausea medication as directed.   If you develop nausea and vomiting that is not controlled by your nausea medication, call the clinic.   BELOW ARE SYMPTOMS THAT SHOULD BE REPORTED IMMEDIATELY:  *FEVER GREATER THAN 100.5 F  *CHILLS WITH OR WITHOUT FEVER  NAUSEA AND VOMITING THAT IS NOT CONTROLLED WITH YOUR NAUSEA MEDICATION  *UNUSUAL SHORTNESS OF BREATH  *UNUSUAL BRUISING OR BLEEDING  TENDERNESS IN MOUTH AND THROAT WITH OR WITHOUT PRESENCE OF ULCERS  *URINARY PROBLEMS  *BOWEL PROBLEMS  UNUSUAL RASH Items with * indicate a potential emergency and should be followed up as soon as possible.  Feel free to call the clinic should you have any questions or concerns. The clinic phone number is [phone].  Please show the [place] at check-in to the Emergency Department and triage nurse.

## 2020-04-12 ENCOUNTER — Telehealth: Payer: Self-pay | Admitting: Physician Assistant

## 2020-04-12 ENCOUNTER — Inpatient Hospital Stay: Payer: Medicare Other

## 2020-04-12 ENCOUNTER — Other Ambulatory Visit: Payer: Self-pay

## 2020-04-12 ENCOUNTER — Telehealth: Payer: Medicare Other

## 2020-04-12 VITALS — BP 132/79 | HR 69 | Temp 98.3°F | Resp 17

## 2020-04-12 DIAGNOSIS — J449 Chronic obstructive pulmonary disease, unspecified: Secondary | ICD-10-CM | POA: Diagnosis not present

## 2020-04-12 DIAGNOSIS — Z452 Encounter for adjustment and management of vascular access device: Secondary | ICD-10-CM | POA: Diagnosis not present

## 2020-04-12 DIAGNOSIS — Z5111 Encounter for antineoplastic chemotherapy: Secondary | ICD-10-CM | POA: Diagnosis not present

## 2020-04-12 DIAGNOSIS — I480 Paroxysmal atrial fibrillation: Secondary | ICD-10-CM | POA: Diagnosis not present

## 2020-04-12 DIAGNOSIS — E1169 Type 2 diabetes mellitus with other specified complication: Secondary | ICD-10-CM

## 2020-04-12 DIAGNOSIS — I10 Essential (primary) hypertension: Secondary | ICD-10-CM | POA: Diagnosis not present

## 2020-04-12 DIAGNOSIS — I119 Hypertensive heart disease without heart failure: Secondary | ICD-10-CM

## 2020-04-12 DIAGNOSIS — E785 Hyperlipidemia, unspecified: Secondary | ICD-10-CM

## 2020-04-12 DIAGNOSIS — C3491 Malignant neoplasm of unspecified part of right bronchus or lung: Secondary | ICD-10-CM

## 2020-04-12 DIAGNOSIS — C7951 Secondary malignant neoplasm of bone: Secondary | ICD-10-CM | POA: Diagnosis not present

## 2020-04-12 DIAGNOSIS — C7800 Secondary malignant neoplasm of unspecified lung: Secondary | ICD-10-CM | POA: Diagnosis not present

## 2020-04-12 DIAGNOSIS — E119 Type 2 diabetes mellitus without complications: Secondary | ICD-10-CM | POA: Diagnosis not present

## 2020-04-12 DIAGNOSIS — Z5112 Encounter for antineoplastic immunotherapy: Secondary | ICD-10-CM | POA: Diagnosis not present

## 2020-04-12 DIAGNOSIS — Z5189 Encounter for other specified aftercare: Secondary | ICD-10-CM | POA: Diagnosis not present

## 2020-04-12 DIAGNOSIS — Z79899 Other long term (current) drug therapy: Secondary | ICD-10-CM | POA: Diagnosis not present

## 2020-04-12 DIAGNOSIS — K529 Noninfective gastroenteritis and colitis, unspecified: Secondary | ICD-10-CM | POA: Diagnosis not present

## 2020-04-12 DIAGNOSIS — C3411 Malignant neoplasm of upper lobe, right bronchus or lung: Secondary | ICD-10-CM | POA: Diagnosis not present

## 2020-04-12 DIAGNOSIS — I2510 Atherosclerotic heart disease of native coronary artery without angina pectoris: Secondary | ICD-10-CM | POA: Diagnosis not present

## 2020-04-12 MED ORDER — SODIUM CHLORIDE 0.9 % IV SOLN
Freq: Once | INTRAVENOUS | Status: AC
  Filled 2020-04-12: qty 250

## 2020-04-12 MED ORDER — SODIUM CHLORIDE 0.9 % IV SOLN
10.0000 mg | Freq: Once | INTRAVENOUS | Status: AC
  Administered 2020-04-12: 10 mg via INTRAVENOUS
  Filled 2020-04-12: qty 1

## 2020-04-12 MED ORDER — HEPARIN SOD (PORK) LOCK FLUSH 100 UNIT/ML IV SOLN
500.0000 [IU] | Freq: Once | INTRAVENOUS | Status: AC | PRN
  Administered 2020-04-12: 500 [IU]
  Filled 2020-04-12: qty 5

## 2020-04-12 MED ORDER — SODIUM CHLORIDE 0.9% FLUSH
10.0000 mL | INTRAVENOUS | Status: DC | PRN
  Administered 2020-04-12: 10 mL
  Filled 2020-04-12: qty 10

## 2020-04-12 MED ORDER — SODIUM CHLORIDE 0.9 % IV SOLN
100.0000 mg/m2 | Freq: Once | INTRAVENOUS | Status: AC
  Administered 2020-04-12: 150 mg via INTRAVENOUS
  Filled 2020-04-12: qty 7.5

## 2020-04-12 NOTE — Patient Instructions (Signed)
[facility] Discharge Instructions for Patients Receiving Chemotherapy  Today you received the following chemotherapy agents:Etoposide.  To help prevent nausea and vomiting after your treatment, we encourage you to take your nausea medication as directed.   If you develop nausea and vomiting that is not controlled by your nausea medication, call the clinic.   BELOW ARE SYMPTOMS THAT SHOULD BE REPORTED IMMEDIATELY:  *FEVER GREATER THAN 100.5 F  *CHILLS WITH OR WITHOUT FEVER  NAUSEA AND VOMITING THAT IS NOT CONTROLLED WITH YOUR NAUSEA MEDICATION  *UNUSUAL SHORTNESS OF BREATH  *UNUSUAL BRUISING OR BLEEDING  TENDERNESS IN MOUTH AND THROAT WITH OR WITHOUT PRESENCE OF ULCERS  *URINARY PROBLEMS  *BOWEL PROBLEMS  UNUSUAL RASH Items with * indicate a potential emergency and should be followed up as soon as possible.  Feel free to call the clinic should you have any questions or concerns. The clinic phone number is [phone].  Please show the [place] at check-in to the Emergency Department and triage nurse.

## 2020-04-12 NOTE — Telephone Encounter (Signed)
Scheduled per 7/12 los. Noted to give pt updated calendar.

## 2020-04-13 ENCOUNTER — Inpatient Hospital Stay: Payer: Medicare Other

## 2020-04-13 ENCOUNTER — Other Ambulatory Visit: Payer: Self-pay

## 2020-04-13 VITALS — BP 138/62 | HR 58 | Temp 97.9°F | Resp 18

## 2020-04-13 DIAGNOSIS — Z79899 Other long term (current) drug therapy: Secondary | ICD-10-CM | POA: Diagnosis not present

## 2020-04-13 DIAGNOSIS — Z5111 Encounter for antineoplastic chemotherapy: Secondary | ICD-10-CM | POA: Diagnosis not present

## 2020-04-13 DIAGNOSIS — C7951 Secondary malignant neoplasm of bone: Secondary | ICD-10-CM | POA: Diagnosis not present

## 2020-04-13 DIAGNOSIS — I480 Paroxysmal atrial fibrillation: Secondary | ICD-10-CM | POA: Diagnosis not present

## 2020-04-13 DIAGNOSIS — E119 Type 2 diabetes mellitus without complications: Secondary | ICD-10-CM | POA: Diagnosis not present

## 2020-04-13 DIAGNOSIS — Z5112 Encounter for antineoplastic immunotherapy: Secondary | ICD-10-CM | POA: Diagnosis not present

## 2020-04-13 DIAGNOSIS — K529 Noninfective gastroenteritis and colitis, unspecified: Secondary | ICD-10-CM | POA: Diagnosis not present

## 2020-04-13 DIAGNOSIS — J449 Chronic obstructive pulmonary disease, unspecified: Secondary | ICD-10-CM | POA: Diagnosis not present

## 2020-04-13 DIAGNOSIS — C3491 Malignant neoplasm of unspecified part of right bronchus or lung: Secondary | ICD-10-CM

## 2020-04-13 DIAGNOSIS — C3411 Malignant neoplasm of upper lobe, right bronchus or lung: Secondary | ICD-10-CM | POA: Diagnosis not present

## 2020-04-13 DIAGNOSIS — Z5189 Encounter for other specified aftercare: Secondary | ICD-10-CM | POA: Diagnosis not present

## 2020-04-13 DIAGNOSIS — I2510 Atherosclerotic heart disease of native coronary artery without angina pectoris: Secondary | ICD-10-CM | POA: Diagnosis not present

## 2020-04-13 DIAGNOSIS — I10 Essential (primary) hypertension: Secondary | ICD-10-CM | POA: Diagnosis not present

## 2020-04-13 DIAGNOSIS — C7800 Secondary malignant neoplasm of unspecified lung: Secondary | ICD-10-CM | POA: Diagnosis not present

## 2020-04-13 DIAGNOSIS — Z452 Encounter for adjustment and management of vascular access device: Secondary | ICD-10-CM | POA: Diagnosis not present

## 2020-04-13 MED ORDER — HEPARIN SOD (PORK) LOCK FLUSH 100 UNIT/ML IV SOLN
500.0000 [IU] | Freq: Once | INTRAVENOUS | Status: AC | PRN
  Administered 2020-04-13: 500 [IU]
  Filled 2020-04-13: qty 5

## 2020-04-13 MED ORDER — SODIUM CHLORIDE 0.9 % IV SOLN
Freq: Once | INTRAVENOUS | Status: AC
  Filled 2020-04-13: qty 250

## 2020-04-13 MED ORDER — SODIUM CHLORIDE 0.9 % IV SOLN
100.0000 mg/m2 | Freq: Once | INTRAVENOUS | Status: AC
  Administered 2020-04-13: 150 mg via INTRAVENOUS
  Filled 2020-04-13: qty 7.5

## 2020-04-13 MED ORDER — SODIUM CHLORIDE 0.9% FLUSH
10.0000 mL | INTRAVENOUS | Status: DC | PRN
  Administered 2020-04-13: 10 mL
  Filled 2020-04-13: qty 10

## 2020-04-13 MED ORDER — SODIUM CHLORIDE 0.9 % IV SOLN
10.0000 mg | Freq: Once | INTRAVENOUS | Status: AC
  Administered 2020-04-13: 10 mg via INTRAVENOUS
  Filled 2020-04-13: qty 10

## 2020-04-13 NOTE — Patient Instructions (Signed)
[facility] Discharge Instructions for Patients Receiving Chemotherapy  Today you received the following chemotherapy agents: etoposide  To help prevent nausea and vomiting after your treatment, we encourage you to take your nausea medication as directed.   If you develop nausea and vomiting that is not controlled by your nausea medication, call the clinic.   BELOW ARE SYMPTOMS THAT SHOULD BE REPORTED IMMEDIATELY:  *FEVER GREATER THAN 100.5 F  *CHILLS WITH OR WITHOUT FEVER  NAUSEA AND VOMITING THAT IS NOT CONTROLLED WITH YOUR NAUSEA MEDICATION  *UNUSUAL SHORTNESS OF BREATH  *UNUSUAL BRUISING OR BLEEDING  TENDERNESS IN MOUTH AND THROAT WITH OR WITHOUT PRESENCE OF ULCERS  *URINARY PROBLEMS  *BOWEL PROBLEMS  UNUSUAL RASH Items with * indicate a potential emergency and should be followed up as soon as possible.  Feel free to call the clinic should you have any questions or concerns. The clinic phone number is [phone].  Please show the CHEMO ALERT CARD at check-in to the Emergency Department and triage nurse.   

## 2020-04-15 ENCOUNTER — Inpatient Hospital Stay: Payer: Medicare Other

## 2020-04-15 ENCOUNTER — Ambulatory Visit: Payer: Medicare Other | Admitting: Family Medicine

## 2020-04-15 ENCOUNTER — Other Ambulatory Visit: Payer: Self-pay

## 2020-04-15 VITALS — BP 129/48 | HR 60 | Temp 98.4°F | Resp 18

## 2020-04-15 DIAGNOSIS — C7800 Secondary malignant neoplasm of unspecified lung: Secondary | ICD-10-CM | POA: Diagnosis not present

## 2020-04-15 DIAGNOSIS — Z5112 Encounter for antineoplastic immunotherapy: Secondary | ICD-10-CM | POA: Diagnosis not present

## 2020-04-15 DIAGNOSIS — Z452 Encounter for adjustment and management of vascular access device: Secondary | ICD-10-CM | POA: Diagnosis not present

## 2020-04-15 DIAGNOSIS — I10 Essential (primary) hypertension: Secondary | ICD-10-CM | POA: Diagnosis not present

## 2020-04-15 DIAGNOSIS — Z5111 Encounter for antineoplastic chemotherapy: Secondary | ICD-10-CM | POA: Diagnosis not present

## 2020-04-15 DIAGNOSIS — E119 Type 2 diabetes mellitus without complications: Secondary | ICD-10-CM | POA: Diagnosis not present

## 2020-04-15 DIAGNOSIS — J449 Chronic obstructive pulmonary disease, unspecified: Secondary | ICD-10-CM | POA: Diagnosis not present

## 2020-04-15 DIAGNOSIS — I480 Paroxysmal atrial fibrillation: Secondary | ICD-10-CM | POA: Diagnosis not present

## 2020-04-15 DIAGNOSIS — K529 Noninfective gastroenteritis and colitis, unspecified: Secondary | ICD-10-CM | POA: Diagnosis not present

## 2020-04-15 DIAGNOSIS — C3491 Malignant neoplasm of unspecified part of right bronchus or lung: Secondary | ICD-10-CM

## 2020-04-15 DIAGNOSIS — Z79899 Other long term (current) drug therapy: Secondary | ICD-10-CM | POA: Diagnosis not present

## 2020-04-15 DIAGNOSIS — Z5189 Encounter for other specified aftercare: Secondary | ICD-10-CM | POA: Diagnosis not present

## 2020-04-15 DIAGNOSIS — C7951 Secondary malignant neoplasm of bone: Secondary | ICD-10-CM | POA: Diagnosis not present

## 2020-04-15 DIAGNOSIS — C3411 Malignant neoplasm of upper lobe, right bronchus or lung: Secondary | ICD-10-CM | POA: Diagnosis not present

## 2020-04-15 DIAGNOSIS — I2510 Atherosclerotic heart disease of native coronary artery without angina pectoris: Secondary | ICD-10-CM | POA: Diagnosis not present

## 2020-04-15 MED ORDER — PEGFILGRASTIM-CBQV 6 MG/0.6ML ~~LOC~~ SOSY
6.0000 mg | PREFILLED_SYRINGE | Freq: Once | SUBCUTANEOUS | Status: AC
  Administered 2020-04-15: 6 mg via SUBCUTANEOUS

## 2020-04-15 MED ORDER — PEGFILGRASTIM-CBQV 6 MG/0.6ML ~~LOC~~ SOSY
PREFILLED_SYRINGE | SUBCUTANEOUS | Status: AC
  Filled 2020-04-15: qty 0.6

## 2020-04-18 ENCOUNTER — Inpatient Hospital Stay: Payer: Medicare Other

## 2020-04-20 ENCOUNTER — Telehealth: Payer: Self-pay | Admitting: Internal Medicine

## 2020-04-20 NOTE — Telephone Encounter (Signed)
Called pt per 7/20 sch msg  - friend answered , will tell pt to call back when she wakes up from her nap

## 2020-04-25 ENCOUNTER — Other Ambulatory Visit: Payer: Self-pay

## 2020-04-25 ENCOUNTER — Inpatient Hospital Stay: Payer: Medicare Other

## 2020-04-25 DIAGNOSIS — E119 Type 2 diabetes mellitus without complications: Secondary | ICD-10-CM | POA: Diagnosis not present

## 2020-04-25 DIAGNOSIS — C3491 Malignant neoplasm of unspecified part of right bronchus or lung: Secondary | ICD-10-CM

## 2020-04-25 DIAGNOSIS — Z5189 Encounter for other specified aftercare: Secondary | ICD-10-CM | POA: Diagnosis not present

## 2020-04-25 DIAGNOSIS — Z79899 Other long term (current) drug therapy: Secondary | ICD-10-CM | POA: Diagnosis not present

## 2020-04-25 DIAGNOSIS — C7800 Secondary malignant neoplasm of unspecified lung: Secondary | ICD-10-CM | POA: Diagnosis not present

## 2020-04-25 DIAGNOSIS — I2510 Atherosclerotic heart disease of native coronary artery without angina pectoris: Secondary | ICD-10-CM | POA: Diagnosis not present

## 2020-04-25 DIAGNOSIS — K529 Noninfective gastroenteritis and colitis, unspecified: Secondary | ICD-10-CM | POA: Diagnosis not present

## 2020-04-25 DIAGNOSIS — Z452 Encounter for adjustment and management of vascular access device: Secondary | ICD-10-CM | POA: Diagnosis not present

## 2020-04-25 DIAGNOSIS — Z5111 Encounter for antineoplastic chemotherapy: Secondary | ICD-10-CM | POA: Diagnosis not present

## 2020-04-25 DIAGNOSIS — I10 Essential (primary) hypertension: Secondary | ICD-10-CM | POA: Diagnosis not present

## 2020-04-25 DIAGNOSIS — J449 Chronic obstructive pulmonary disease, unspecified: Secondary | ICD-10-CM | POA: Diagnosis not present

## 2020-04-25 DIAGNOSIS — Z5112 Encounter for antineoplastic immunotherapy: Secondary | ICD-10-CM | POA: Diagnosis not present

## 2020-04-25 DIAGNOSIS — C7951 Secondary malignant neoplasm of bone: Secondary | ICD-10-CM | POA: Diagnosis not present

## 2020-04-25 DIAGNOSIS — C3411 Malignant neoplasm of upper lobe, right bronchus or lung: Secondary | ICD-10-CM | POA: Diagnosis not present

## 2020-04-25 DIAGNOSIS — I480 Paroxysmal atrial fibrillation: Secondary | ICD-10-CM | POA: Diagnosis not present

## 2020-04-25 LAB — CBC WITH DIFFERENTIAL (CANCER CENTER ONLY)
Abs Immature Granulocytes: 0.51 10*3/uL — ABNORMAL HIGH (ref 0.00–0.07)
Basophils Absolute: 0.1 10*3/uL (ref 0.0–0.1)
Basophils Relative: 1 %
Eosinophils Absolute: 0.1 10*3/uL (ref 0.0–0.5)
Eosinophils Relative: 1 %
HCT: 28.6 % — ABNORMAL LOW (ref 36.0–46.0)
Hemoglobin: 9.1 g/dL — ABNORMAL LOW (ref 12.0–15.0)
Immature Granulocytes: 5 %
Lymphocytes Relative: 17 %
Lymphs Abs: 1.9 10*3/uL (ref 0.7–4.0)
MCH: 29.9 pg (ref 26.0–34.0)
MCHC: 31.8 g/dL (ref 30.0–36.0)
MCV: 94.1 fL (ref 80.0–100.0)
Monocytes Absolute: 0.9 10*3/uL (ref 0.1–1.0)
Monocytes Relative: 8 %
Neutro Abs: 7.3 10*3/uL (ref 1.7–7.7)
Neutrophils Relative %: 68 %
Platelet Count: 70 10*3/uL — ABNORMAL LOW (ref 150–400)
RBC: 3.04 MIL/uL — ABNORMAL LOW (ref 3.87–5.11)
RDW: 18.6 % — ABNORMAL HIGH (ref 11.5–15.5)
WBC Count: 10.6 10*3/uL — ABNORMAL HIGH (ref 4.0–10.5)
nRBC: 0 % (ref 0.0–0.2)

## 2020-04-25 LAB — CMP (CANCER CENTER ONLY)
ALT: 21 U/L (ref 0–44)
AST: 14 U/L — ABNORMAL LOW (ref 15–41)
Albumin: 3.4 g/dL — ABNORMAL LOW (ref 3.5–5.0)
Alkaline Phosphatase: 106 U/L (ref 38–126)
Anion gap: 7 (ref 5–15)
BUN: 13 mg/dL (ref 8–23)
CO2: 25 mmol/L (ref 22–32)
Calcium: 9.2 mg/dL (ref 8.9–10.3)
Chloride: 112 mmol/L — ABNORMAL HIGH (ref 98–111)
Creatinine: 0.69 mg/dL (ref 0.44–1.00)
GFR, Est AFR Am: >60 mL/min (ref >60)
GFR, Estimated: >60 mL/min (ref >60)
Glucose, Bld: 95 mg/dL (ref 70–99)
Potassium: 3.5 mmol/L (ref 3.5–5.1)
Sodium: 144 mmol/L (ref 135–145)
Total Bilirubin: 0.3 mg/dL (ref 0.3–1.2)
Total Protein: 6.1 g/dL — ABNORMAL LOW (ref 6.5–8.1)

## 2020-04-26 ENCOUNTER — Telehealth: Payer: Medicare Other

## 2020-04-27 ENCOUNTER — Other Ambulatory Visit: Payer: Self-pay

## 2020-04-27 ENCOUNTER — Encounter: Payer: Self-pay | Admitting: Family Medicine

## 2020-04-27 ENCOUNTER — Ambulatory Visit (INDEPENDENT_AMBULATORY_CARE_PROVIDER_SITE_OTHER): Payer: Medicare Other | Admitting: Family Medicine

## 2020-04-27 VITALS — BP 120/60 | HR 76 | Resp 16 | Ht 62.0 in | Wt 117.0 lb

## 2020-04-27 DIAGNOSIS — K529 Noninfective gastroenteritis and colitis, unspecified: Secondary | ICD-10-CM | POA: Diagnosis not present

## 2020-04-27 DIAGNOSIS — E119 Type 2 diabetes mellitus without complications: Secondary | ICD-10-CM

## 2020-04-27 DIAGNOSIS — E1169 Type 2 diabetes mellitus with other specified complication: Secondary | ICD-10-CM

## 2020-04-27 DIAGNOSIS — Z23 Encounter for immunization: Secondary | ICD-10-CM | POA: Diagnosis not present

## 2020-04-27 DIAGNOSIS — R1013 Epigastric pain: Secondary | ICD-10-CM

## 2020-04-27 DIAGNOSIS — I119 Hypertensive heart disease without heart failure: Secondary | ICD-10-CM | POA: Diagnosis not present

## 2020-04-27 DIAGNOSIS — K219 Gastro-esophageal reflux disease without esophagitis: Secondary | ICD-10-CM

## 2020-04-27 DIAGNOSIS — I700 Atherosclerosis of aorta: Secondary | ICD-10-CM

## 2020-04-27 LAB — POCT GLYCOSYLATED HEMOGLOBIN (HGB A1C): Hemoglobin A1C: 5.4 % (ref 4.0–5.6)

## 2020-04-27 MED ORDER — DOXEPIN HCL 10 MG/ML PO CONC: 3.0000 mg | Freq: Every day | ORAL | 1 refills | Status: DC

## 2020-04-27 NOTE — Patient Instructions (Signed)
A few things to remember from today's visit: Today we added Doxepin trying to help with diarrhea. Decrease Protonix from 40 mg to 20 mg. Monitor for changes. Monitor blood pressure and pulse/heart rate.  Lab orders placed to be done with next blood drawn.  If you need refills please call your pharmacy. Do not use My Chart to request refills or for acute issues that need immediate attention.    Please be sure medication list is accurate. If a new problem present, please set up appointment sooner than planned today.

## 2020-04-27 NOTE — Progress Notes (Signed)
HPI: [name] is a 85 y.o. female, who is here today with her daughter for chronic disease management.  She was last seen on 12/30/19. Since her last visit she has seen oncologist and undergoing chemotherapy for stage [id number] small cell lung cancer with left lobe lobe and bone metastasis. In general she has tolerated treatment well. She is not longer smoking. Negative for worsening cough,SOB,or wheezing.  DM II: She is on Tradjenta 5 mg daily. She is not checking BS's regularly. Negative for polydipsia,polyuria, or polyphagia. 09/30/19 HgA1C was 6.5.  Lab Results  Component Value Date   HGBA1C 6.5 09/30/2019   HTN: She is on Carvedilol 12.5 mg bid and Losartan 50 mg daily. + CAD and atrial fib. She is on Atorvastatin 10 mg and on Xarelto 15 mg daily.  Lab Results  Component Value Date   CREATININE 0.69 04/25/2020   BUN 13 04/25/2020   NA 144 04/25/2020   K 3.5 04/25/2020   CL 112 (H) 04/25/2020   CO2 25 04/25/2020   Negative for severe/frequent headache, visual changes, chest pain,palpitation, or edema.  Upper abdominal pain:   Lab Results  Component Value Date   WBC 10.6 (H) 04/25/2020   HGB 9.1 (L) 04/25/2020   HCT 28.6 (L) 04/25/2020   MCV 94.1 04/25/2020   PLT 70 (L) 04/25/2020   + Diarrhea. She has problem intermittent for a while. 09/2019 she was c/o constipation but has had daily loose stools since 10/2019. Number of stools depends of how often she eats.  Abdominal cramps most of the time with food intake but a few time she has had abdominal pain not related with food intake. Symptoms alleviated after defecation.  Nausea has improved and no vomiting.  Imodium does not help. Bentyl has not helped. She has not noted melena or blood in stool. S/P cholecystectomy.  Abdominal CT on 12/28/19: No evidence of dissection or aneurysmal dilatation is identified.  No evidence of pulmonary emboli.  Bilateral lung masses in the right upper and left  lower lobe with associated hilar and mediastinal adenopathy consistent with neoplasm.  Right-sided pleural effusion is noted as well. Further workup by means of tissue sampling and PET-CT would be helpful. No acute abnormality in the abdomen is seen.   Aortic Atherosclerosis (ICD10-I70.0) and Emphysema (ICD10-J43.9).  GERD: She is on Protonix 40 mg. She has not noted heartburn. PPI has not helped with epigastric pain.  Review of Systems  Constitutional: Positive for fatigue. Negative for chills and fever.  HENT: Negative for sore throat and trouble swallowing.   Genitourinary: Negative for decreased urine volume, dysuria and hematuria.  Musculoskeletal: Negative for gait problem.  Skin: Negative for rash and wound.  Psychiatric/Behavioral: Negative for confusion. The patient is not nervous/anxious.   Rest of ROS, see pertinent positives sand negatives in HPI  Current Outpatient Medications on File Prior to Visit  Medication Sig Dispense Refill  . acetaminophen (TYLENOL) 500 MG tablet Take 500 mg by mouth every 6 (six) hours as needed for headache.    . albuterol (VENTOLIN HFA) 108 (90 Base) MCG/ACT inhaler TAKE 2 PUFFS BY MOUTH EVERY 6 HOURS AS NEEDED FOR WHEEZE OR SHORTNESS OF BREATH 8.5 g 1  . atorvastatin (LIPITOR) 10 MG tablet TAKE 1 TABLET BY MOUTH EVERY DAY 90 tablet 3  . carvedilol (COREG) 12.5 MG tablet TAKE 1 TABLET (12.5 MG TOTAL) BY MOUTH 2 (TWO) TIMES DAILY WITH A MEAL. 180 tablet 2  . [employer] NICOTINE TRANSDERMAL SYS 14 MG/24HR  patch PLACE 1 PATCH (14 MG TOTAL) ONTO THE SKIN DAILY. 28 patch 0  . diclofenac sodium (VOLTAREN) 1 % GEL Apply 4 g topically 4 (four) times daily. (Patient taking differently: Apply 4 g topically 4 (four) times daily as needed (pain). ) 4 Tube 3  . dicyclomine (BENTYL) 10 MG capsule TAKE 1 CAPSULE (10 MG TOTAL) BY MOUTH 4 (FOUR) TIMES DAILY - BEFORE MEALS AND AT BEDTIME. 90 capsule 0  . fenofibrate (TRICOR) 48 MG tablet TAKE 1 TABLET BY MOUTH EVERY DAY  (Patient taking differently: Take 48 mg by mouth daily after lunch. ) 90 tablet 3  . glucose blood (ONETOUCH VERIO) test strip Use to test 3-4 times daily. 300 strip 4  . levothyroxine (SYNTHROID) 112 MCG tablet TAKE 1 TABLET (112 MCG TOTAL) BY MOUTH DAILY BEFORE BREAKFAST. 90 tablet 2  . lidocaine-prilocaine (EMLA) cream Apply 1 application topically as needed. 30 g 2  . linagliptin (TRADJENTA) 5 MG TABS tablet TAKE 1 TABLET (5 MG TOTAL) DAILY BY MOUTH. (Patient taking differently: Take 5 mg by mouth daily after breakfast. ) 90 tablet 1  . losartan (COZAAR) 50 MG tablet Take 2 tablets by mouth daily. (Patient taking differently: Take 100 mg by mouth daily. ) 180 tablet 1  . mupirocin ointment (BACTROBAN) 2 % Apply 1 application topically 2 (two) times daily. 22 g 0  . nitroGLYCERIN (NITROSTAT) 0.4 MG SL tablet Place 1 tablet (0.4 mg total) under the tongue every 5 (five) minutes x 3 doses as needed for chest pain (Max 3 doses within 15 min. Call 911). 10 tablet 0  . oxyCODONE-acetaminophen (PERCOCET/ROXICET) 5-325 MG tablet Take 1 tablet by mouth every 8 (eight) hours as needed for severe pain. 20 tablet 0  . pantoprazole (PROTONIX) 40 MG tablet TAKE 1 TABLET BY MOUTH EVERY DAY 90 tablet 1  . potassium chloride SA (KLOR-CON M20) 20 MEQ tablet TAKE 1 TABLET BY MOUTH TWICE A DAY FOR 2 DAYS THEN TAKE 1 TABLET BY MOUTH EVERY DAY 90 tablet 1  . prochlorperazine (COMPAZINE) 10 MG tablet Take 1 tablet (10 mg total) by mouth every 6 (six) hours as needed for nausea or vomiting. 30 tablet 1  . XARELTO 15 MG TABS tablet TAKE 1 TABLET BY MOUTH DAILY WITH SUPPER. (Patient taking differently: Take 15 mg by mouth daily with supper. ) 30 tablet 3   No current facility-administered medications on file prior to visit.   Past Medical History:  Diagnosis Date  . CAD (coronary artery disease), native coronary artery    PTCA of distal right coronary artery 1997 Cypher stents to circumflex 2005 Cardiac cath in 2011  with patent stent to circumflex and moderate disease elsewhere treated medically   . Cancer ([place])   . Cardiac pacemaker in situ 12/24/2015   Original implant reportedly in 1991 for tachybradycardia syndrome, generator change in 1999, 2004 and evidently again in 2016 in [place]   . COPD (chronic obstructive pulmonary disease) ([place])   . Diabetes mellitus without complication ([place])   . Hypertension   . Hypothyroidism 03/23/2010  . Pacemaker   . Paroxysmal atrial fibrillation ([place]) 03/23/2010   CHA2DS2VASC score 5    No Known Allergies  Social History   Socioeconomic History  . Marital status: Widowed    Spouse name: Not on file  . Number of children: Not on file  . Years of education: Not on file  . Highest education level: Not on file  Occupational History  . Occupation: retired  Tobacco Use  . Smoking status: Former Smoker    Packs/day: 0.75    Years: 66.00    Pack years: 49.50    Quit date: 12/27/2019    Years since quitting: 0.3  . Smokeless tobacco: Never Used  Vaping Use  . Vaping Use: Never used  Substance and Sexual Activity  . Alcohol use: No    Alcohol/week: 0.0 standard drinks  . Drug use: No  . Sexual activity: Not on file  Other Topics Concern  . Not on file  Social History Narrative  . Not on file   Social Determinants of Health   Financial Resource Strain:   . Difficulty of Paying Living Expenses:   Food Insecurity:   . Worried About Charity fundraiser in the Last Year:   . Arboriculturist in the Last Year:   Transportation Needs:   . Film/video editor (Medical):   Marland Kitchen Lack of Transportation (Non-Medical):   Physical Activity:   . Days of Exercise per Week:   . Minutes of Exercise per Session:   Stress:   . Feeling of Stress :   Social Connections:   . Frequency of Communication with Friends and Family:   . Frequency of Social Gatherings with Friends and Family:   . Attends Religious Services:   . Active Member of Clubs or Organizations:    . Attends Archivist Meetings:   Marland Kitchen Marital Status:     Vitals:   04/27/20 1424  BP: (!) 120/60  Pulse: 76  Resp: 16  SpO2: 97%   Wt Readings from Last 3 Encounters:  04/27/20 117 lb (53.1 kg)  04/11/20 113 lb 3.2 oz (51.3 kg)  03/21/20 116 lb (52.6 kg)   Body mass index is 21.4 kg/m.  Physical Exam Vitals and nursing note reviewed.  Constitutional:      General: She is not in acute distress.    Appearance: She is well-developed.  HENT:     Head: Normocephalic and atraumatic.     Mouth/Throat:     Dentition: Has dentures.  Eyes:     Conjunctiva/sclera: Conjunctivae normal.     Pupils: Pupils are equal, round, and reactive to light.  Cardiovascular:     Rate and Rhythm: Normal rate and regular rhythm.     Heart sounds: No murmur heard.      Comments: DP pulses present. Pulmonary:     Effort: Pulmonary effort is normal. No respiratory distress.     Breath sounds: Normal breath sounds.  Abdominal:     Palpations: Abdomen is soft. There is no hepatomegaly or mass.     Tenderness: There is no abdominal tenderness.  Skin:    General: Skin is warm.     Findings: No erythema or rash.  Neurological:     Mental Status: She is alert and oriented to person, place, and time.     Cranial Nerves: No cranial nerve deficit.     Gait: Gait normal.  Psychiatric:     Comments: Well groomed, good eye contact.    Diabetic Foot Exam - Simple   Simple Foot Form Diabetic Foot exam was performed with the following findings: Yes 04/27/2020  3:10 PM  Visual Inspection See comments: Yes Sensation Testing Intact to touch and monofilament testing bilaterally: Yes Pulse Check Posterior Tibialis and Dorsalis pulse intact bilaterally: Yes Comments Bunions and hypertrophic toenails.    ASSESSMENT AND PLAN:  Ms. Candence Sease was seen today for chronic disease management.  Orders Placed This Encounter  Procedures  . Pneumococcal polysaccharide vaccine 23-valent greater  than or equal to 2yo subcutaneous/IM  . H. pylori Antibody, IgG  . Fructosamine  . POC HgB A1c   Lab Results  Component Value Date   HGBA1C 5.4 04/27/2020    Hypertension with heart disease BP adequately controlled. No changes in current management. Low salt diet to continue.  Type 2 diabetes mellitus with other specified complication ([place]) [id number] at goal today.  I think we can consider stopping Tradjenta but will wait until fructosamine is done.  Annual eye exam and foot care recommended. F/U in 4 months   Chronic diarrhea IBS most likely etiology. Adequate hydration. After discussion of some side effects, she agrees with trying Doxepin 3 mg daily. Bentyl did not help,so discontinue.  Epigastric abdominal pain We discussed possible etiologies. Could be related to IBS, ? Dyspepsia. She doe snot want to go back to GI for now. Instructed about warning signs. Further recommendations according to H. Pylori result.   GERD Well controlled. Protonix has not helped with epigastric pain, so recommend decreasing dose from 20 mg to 40 mg. Continue GERD precautions.  Atherosclerosis of aorta (H[facility]) Seen on imaging. On Atorvastatin 10 mg. She is on Xarelto.  Lab orders placed to be done with next blood drawn.  Return in about 6 weeks (around 06/08/2020).   Taray Normoyle G. [name], MD  [facility]. [place] office.   A few things to remember from today's visit: Today we added Doxepin trying to help with diarrhea. Decrease Protonix from 40 mg to 20 mg. Monitor for changes. Monitor blood pressure and pulse/heart rate.  Lab orders placed to be done with next blood drawn.  If you need refills please call your pharmacy. Do not use My Chart to request refills or for acute issues that need immediate attention.    Please be sure medication list is accurate. If a new problem present, please set up appointment sooner than planned today.

## 2020-04-27 NOTE — Assessment & Plan Note (Signed)
Well controlled. Protonix has not helped with epigastric pain, so recommend decreasing dose from 20 mg to 40 mg. Continue GERD precautions.

## 2020-04-27 NOTE — Assessment & Plan Note (Signed)
We discussed possible etiologies. Could be related to IBS, ? Dyspepsia. She doe snot want to go back to GI for now. Instructed about warning signs. Further recommendations according to H. Pylori result.

## 2020-04-27 NOTE — Assessment & Plan Note (Signed)
IBS most likely etiology. Adequate hydration. After discussion of some side effects, she agrees with trying Doxepin 3 mg daily. Bentyl did not help,so discontinue.

## 2020-04-27 NOTE — Assessment & Plan Note (Signed)
BP adequately controlled. No changes in current management. Low salt diet to continue.

## 2020-04-27 NOTE — Assessment & Plan Note (Addendum)
HgA1C at goal today.  I think we can consider stopping Tradjenta but will wait until fructosamine is done.  Annual eye exam and foot care recommended. F/U in 4 months

## 2020-04-29 ENCOUNTER — Other Ambulatory Visit: Payer: Self-pay

## 2020-04-29 ENCOUNTER — Ambulatory Visit (HOSPITAL_COMMUNITY)
Admission: RE | Admit: 2020-04-29 | Discharge: 2020-04-29 | Disposition: A | Discharge status: Home / Self Care | Payer: Medicare Other | Source: Ambulatory Visit | Attending: Physician Assistant | Admitting: Physician Assistant

## 2020-04-29 DIAGNOSIS — N200 Calculus of kidney: Secondary | ICD-10-CM | POA: Diagnosis not present

## 2020-04-29 DIAGNOSIS — C3491 Malignant neoplasm of unspecified part of right bronchus or lung: Secondary | ICD-10-CM | POA: Insufficient documentation

## 2020-04-29 DIAGNOSIS — J432 Centrilobular emphysema: Secondary | ICD-10-CM | POA: Diagnosis not present

## 2020-04-29 DIAGNOSIS — C3490 Malignant neoplasm of unspecified part of unspecified bronchus or lung: Secondary | ICD-10-CM | POA: Diagnosis not present

## 2020-04-29 MED ORDER — HEPARIN SOD (PORK) LOCK FLUSH 100 UNIT/ML IV SOLN
INTRAVENOUS | Status: AC
  Administered 2020-04-29: 500 [IU]
  Filled 2020-04-29: qty 5

## 2020-04-29 MED ORDER — HEPARIN SOD (PORK) LOCK FLUSH 100 UNIT/ML IV SOLN: 500.0000 [IU] | Freq: Once | INTRAVENOUS | Status: AC

## 2020-04-29 MED ORDER — IOHEXOL 300 MG/ML  SOLN
100.0000 mL | Freq: Once | INTRAMUSCULAR | Status: AC | PRN
  Administered 2020-04-29: 100 mL via INTRAVENOUS

## 2020-04-30 ENCOUNTER — Encounter: Payer: Self-pay | Admitting: Family Medicine

## 2020-05-02 ENCOUNTER — Inpatient Hospital Stay: Payer: Medicare Other

## 2020-05-02 ENCOUNTER — Other Ambulatory Visit: Payer: Self-pay | Admitting: Medical Oncology

## 2020-05-02 ENCOUNTER — Ambulatory Visit: Payer: Medicare Other

## 2020-05-02 ENCOUNTER — Telehealth: Payer: Self-pay | Admitting: Medical Oncology

## 2020-05-02 ENCOUNTER — Other Ambulatory Visit: Payer: Self-pay

## 2020-05-02 ENCOUNTER — Telehealth: Payer: Self-pay | Admitting: Family Medicine

## 2020-05-02 ENCOUNTER — Inpatient Hospital Stay (HOSPITAL_BASED_OUTPATIENT_CLINIC_OR_DEPARTMENT_OTHER): Payer: Medicare Other | Admitting: Internal Medicine

## 2020-05-02 ENCOUNTER — Encounter: Payer: Self-pay | Admitting: Internal Medicine

## 2020-05-02 ENCOUNTER — Inpatient Hospital Stay: Payer: Medicare Other | Attending: Internal Medicine

## 2020-05-02 VITALS — BP 147/54 | HR 67 | Temp 98.0°F | Resp 20 | Ht 62.0 in | Wt 116.2 lb

## 2020-05-02 DIAGNOSIS — C3411 Malignant neoplasm of upper lobe, right bronchus or lung: Secondary | ICD-10-CM | POA: Insufficient documentation

## 2020-05-02 DIAGNOSIS — I480 Paroxysmal atrial fibrillation: Secondary | ICD-10-CM | POA: Insufficient documentation

## 2020-05-02 DIAGNOSIS — Z95828 Presence of other vascular implants and grafts: Secondary | ICD-10-CM

## 2020-05-02 DIAGNOSIS — E039 Hypothyroidism, unspecified: Secondary | ICD-10-CM | POA: Insufficient documentation

## 2020-05-02 DIAGNOSIS — I10 Essential (primary) hypertension: Secondary | ICD-10-CM | POA: Insufficient documentation

## 2020-05-02 DIAGNOSIS — J449 Chronic obstructive pulmonary disease, unspecified: Secondary | ICD-10-CM | POA: Insufficient documentation

## 2020-05-02 DIAGNOSIS — K529 Noninfective gastroenteritis and colitis, unspecified: Secondary | ICD-10-CM

## 2020-05-02 DIAGNOSIS — C3491 Malignant neoplasm of unspecified part of right bronchus or lung: Secondary | ICD-10-CM

## 2020-05-02 DIAGNOSIS — E876 Hypokalemia: Secondary | ICD-10-CM

## 2020-05-02 DIAGNOSIS — C7951 Secondary malignant neoplasm of bone: Secondary | ICD-10-CM | POA: Insufficient documentation

## 2020-05-02 DIAGNOSIS — Z5112 Encounter for antineoplastic immunotherapy: Secondary | ICD-10-CM | POA: Insufficient documentation

## 2020-05-02 DIAGNOSIS — E119 Type 2 diabetes mellitus without complications: Secondary | ICD-10-CM | POA: Diagnosis not present

## 2020-05-02 DIAGNOSIS — Z79899 Other long term (current) drug therapy: Secondary | ICD-10-CM | POA: Insufficient documentation

## 2020-05-02 LAB — CBC WITH DIFFERENTIAL (CANCER CENTER ONLY)
Abs Immature Granulocytes: 0.09 10*3/uL — ABNORMAL HIGH (ref 0.00–0.07)
Basophils Absolute: 0.1 10*3/uL (ref 0.0–0.1)
Basophils Relative: 0 %
Eosinophils Absolute: 0.1 10*3/uL (ref 0.0–0.5)
Eosinophils Relative: 1 %
HCT: 27.8 % — ABNORMAL LOW (ref 36.0–46.0)
Hemoglobin: 8.9 g/dL — ABNORMAL LOW (ref 12.0–15.0)
Immature Granulocytes: 1 %
Lymphocytes Relative: 12 %
Lymphs Abs: 1.4 10*3/uL (ref 0.7–4.0)
MCH: 29.9 pg (ref 26.0–34.0)
MCHC: 32 g/dL (ref 30.0–36.0)
MCV: 93.3 fL (ref 80.0–100.0)
Monocytes Absolute: 1 10*3/uL (ref 0.1–1.0)
Monocytes Relative: 9 %
Neutro Abs: 9 10*3/uL — ABNORMAL HIGH (ref 1.7–7.7)
Neutrophils Relative %: 77 %
Platelet Count: 200 10*3/uL (ref 150–400)
RBC: 2.98 MIL/uL — ABNORMAL LOW (ref 3.87–5.11)
RDW: 19.3 % — ABNORMAL HIGH (ref 11.5–15.5)
WBC Count: 11.6 10*3/uL — ABNORMAL HIGH (ref 4.0–10.5)
nRBC: 0 % (ref 0.0–0.2)

## 2020-05-02 LAB — CMP (CANCER CENTER ONLY)
ALT: 17 U/L (ref 0–44)
AST: 13 U/L — ABNORMAL LOW (ref 15–41)
Albumin: 3.4 g/dL — ABNORMAL LOW (ref 3.5–5.0)
Alkaline Phosphatase: 82 U/L (ref 38–126)
Anion gap: 7 (ref 5–15)
BUN: 12 mg/dL (ref 8–23)
CO2: 24 mmol/L (ref 22–32)
Calcium: 9.2 mg/dL (ref 8.9–10.3)
Chloride: 113 mmol/L — ABNORMAL HIGH (ref 98–111)
Creatinine: 0.61 mg/dL (ref 0.44–1.00)
GFR, Est AFR Am: >60 mL/min (ref >60)
GFR, Estimated: >60 mL/min (ref >60)
Glucose, Bld: 84 mg/dL (ref 70–99)
Potassium: 3.1 mmol/L — ABNORMAL LOW (ref 3.5–5.1)
Sodium: 144 mmol/L (ref 135–145)
Total Bilirubin: 0.5 mg/dL (ref 0.3–1.2)
Total Protein: 5.9 g/dL — ABNORMAL LOW (ref 6.5–8.1)

## 2020-05-02 LAB — TSH: TSH: 2.282 u[IU]/mL (ref 0.308–3.960)

## 2020-05-02 MED ORDER — SODIUM CHLORIDE 0.9 % IV SOLN
Freq: Once | INTRAVENOUS | Status: AC
  Filled 2020-05-02: qty 250

## 2020-05-02 MED ORDER — SODIUM CHLORIDE 0.9 % IV SOLN
1500.0000 mg | Freq: Once | INTRAVENOUS | Status: AC
  Administered 2020-05-02: 1500 mg via INTRAVENOUS
  Filled 2020-05-02: qty 30

## 2020-05-02 MED ORDER — SODIUM CHLORIDE 0.9% FLUSH
10.0000 mL | Freq: Once | INTRAVENOUS | Status: AC
  Administered 2020-05-02: 10 mL
  Filled 2020-05-02: qty 10

## 2020-05-02 MED ORDER — POTASSIUM CHLORIDE CRYS ER 20 MEQ PO TBCR: 20.0000 meq | EXTENDED_RELEASE_TABLET | Freq: Every day | ORAL | 0 refills | Status: DC

## 2020-05-02 MED ORDER — SODIUM CHLORIDE 0.9% FLUSH
10.0000 mL | INTRAVENOUS | Status: DC | PRN
  Administered 2020-05-02: 10 mL
  Filled 2020-05-02: qty 10

## 2020-05-02 MED ORDER — HEPARIN SOD (PORK) LOCK FLUSH 100 UNIT/ML IV SOLN
500.0000 [IU] | Freq: Once | INTRAVENOUS | Status: AC | PRN
  Administered 2020-05-02: 500 [IU]
  Filled 2020-05-02: qty 5

## 2020-05-02 NOTE — Patient Instructions (Addendum)
[facility] Discharge Instructions for Patients Receiving Chemotherapy  Today you received the following chemotherapy agent: Durvalumab (Imfinzi)  To help prevent nausea and vomiting after your treatment, we encourage you to take your nausea medication as directed by your MD.   If you develop nausea and vomiting that is not controlled by your nausea medication, call the clinic.   BELOW ARE SYMPTOMS THAT SHOULD BE REPORTED IMMEDIATELY:  *FEVER GREATER THAN 100.5 F  *CHILLS WITH OR WITHOUT FEVER  NAUSEA AND VOMITING THAT IS NOT CONTROLLED WITH YOUR NAUSEA MEDICATION  *UNUSUAL SHORTNESS OF BREATH  *UNUSUAL BRUISING OR BLEEDING  TENDERNESS IN MOUTH AND THROAT WITH OR WITHOUT PRESENCE OF ULCERS  *URINARY PROBLEMS  *BOWEL PROBLEMS  UNUSUAL RASH Items with * indicate a potential emergency and should be followed up as soon as possible.  Feel free to call the clinic should you have any questions or concerns. The clinic phone number is [phone].  Please show the [facility] at check-in to the Emergency Department and triage nurse.

## 2020-05-02 NOTE — Progress Notes (Signed)
[facility] [phone]   [phone]) [phone]  OFFICE PROGRESS NOTE  [name], Robin G, MD [address] [place] [address]  DIAGNOSIS: Extensive stage (T2 a, N2, M1 C) small cell lung cancer presented with central right upper lobe lung mass in addition to left lower lobe pulmonary metastasis and bilateral hilar, subcarinal and bilateral paratracheal and left prevascular lymphadenopathy in addition to bone metastasis in the left iliac wing diagnosed in April 2021.  PRIOR THERAPY: None  CURRENT THERAPY: Systemic chemotherapy with carboplatin for AUC of 5 on day 1, etoposide 100 mg/M2 on days 1, 2 and 3 with Neulasta support in addition to Imfinzi 1500 mg IV every 3 weeks during chemotherapy followed by maintenance Imfinzi 1500 mg IV every 4 weeks after cycle #4.  Status post 4 cycles.  INTERVAL HISTORY: [name] 85 y.o. female returns to the clinic today for follow-up visit accompanied by her daughter.  The patient is feeling fine today with no concerning complaints except for few episodes of diarrhea on daily basis.  She has diarrhea before starting her treatment.  She takes Imodium on as-needed basis.  The patient denied having any chest pain, shortness of breath, cough or hemoptysis.  She denied having any nausea, vomiting or constipation.  She has no headache or visual changes.  She denied having any recent weight loss or night sweats.  The patient had repeat CT scan of the chest, abdomen pelvis performed recently and she is here for evaluation and discussion of her risk her results.    MEDICAL HISTORY: Past Medical History:  Diagnosis Date  . CAD (coronary artery disease), native coronary artery    PTCA of distal right coronary artery 1997 Cypher stents to circumflex 2005 Cardiac cath in 2011 with patent stent to circumflex and moderate disease elsewhere treated medically   . Cancer ([place])   . Cardiac pacemaker in situ 12/24/2015   Original  implant reportedly in 1991 for tachybradycardia syndrome, generator change in 1999, 2004 and evidently again in 2016 in [place]   . COPD (chronic obstructive pulmonary disease) ([place])   . Diabetes mellitus without complication ([facility])   . Hypertension   . Hypothyroidism 03/23/2010  . Pacemaker   . Paroxysmal atrial fibrillation ([facility]) 03/23/2010   CHA2DS2VASC score 5     ALLERGIES:  has No Known Allergies.  MEDICATIONS:  Current Outpatient Medications  Medication Sig Dispense Refill  . acetaminophen (TYLENOL) 500 MG tablet Take 500 mg by mouth every 6 (six) hours as needed for headache.    . albuterol (VENTOLIN HFA) 108 (90 Base) MCG/ACT inhaler TAKE 2 PUFFS BY MOUTH EVERY 6 HOURS AS NEEDED FOR WHEEZE OR SHORTNESS OF BREATH 8.5 g 1  . atorvastatin (LIPITOR) 10 MG tablet TAKE 1 TABLET BY MOUTH EVERY DAY 90 tablet 3  . carvedilol (COREG) 12.5 MG tablet TAKE 1 TABLET (12.5 MG TOTAL) BY MOUTH 2 (TWO) TIMES DAILY WITH A MEAL. 180 tablet 2  . [employer] NICOTINE TRANSDERMAL SYS 14 MG/24HR patch PLACE 1 PATCH (14 MG TOTAL) ONTO THE SKIN DAILY. 28 patch 0  . diclofenac sodium (VOLTAREN) 1 % GEL Apply 4 g topically 4 (four) times daily. (Patient taking differently: Apply 4 g topically 4 (four) times daily as needed (pain). ) 4 Tube 3  . dicyclomine (BENTYL) 10 MG capsule TAKE 1 CAPSULE (10 MG TOTAL) BY MOUTH 4 (FOUR) TIMES DAILY - BEFORE MEALS AND AT BEDTIME. 90 capsule 0  . doxepin (SINEQUAN) 10  MG/ML solution Take 0.3 mLs (3 mg total) by mouth at bedtime. 30 mL 1  . fenofibrate (TRICOR) 48 MG tablet TAKE 1 TABLET BY MOUTH EVERY DAY (Patient taking differently: Take 48 mg by mouth daily after lunch. ) 90 tablet 3  . glucose blood (ONETOUCH VERIO) test strip Use to test 3-4 times daily. 300 strip 4  . levothyroxine (SYNTHROID) 112 MCG tablet TAKE 1 TABLET (112 MCG TOTAL) BY MOUTH DAILY BEFORE BREAKFAST. 90 tablet 2  . lidocaine-prilocaine (EMLA) cream Apply 1 application topically as needed. 30 g 2  .  linagliptin (TRADJENTA) 5 MG TABS tablet TAKE 1 TABLET (5 MG TOTAL) DAILY BY MOUTH. (Patient taking differently: Take 5 mg by mouth daily after breakfast. ) 90 tablet 1  . losartan (COZAAR) 50 MG tablet Take 2 tablets by mouth daily. (Patient taking differently: Take 100 mg by mouth daily. ) 180 tablet 1  . mupirocin ointment (BACTROBAN) 2 % Apply 1 application topically 2 (two) times daily. 22 g 0  . nitroGLYCERIN (NITROSTAT) 0.4 MG SL tablet Place 1 tablet (0.4 mg total) under the tongue every 5 (five) minutes x 3 doses as needed for chest pain (Max 3 doses within 15 min. Call 911). 10 tablet 0  . oxyCODONE-acetaminophen (PERCOCET/ROXICET) 5-325 MG tablet Take 1 tablet by mouth every 8 (eight) hours as needed for severe pain. 20 tablet 0  . pantoprazole (PROTONIX) 40 MG tablet TAKE 1 TABLET BY MOUTH EVERY DAY 90 tablet 1  . potassium chloride SA (KLOR-CON M20) 20 MEQ tablet TAKE 1 TABLET BY MOUTH TWICE A DAY FOR 2 DAYS THEN TAKE 1 TABLET BY MOUTH EVERY DAY 90 tablet 1  . prochlorperazine (COMPAZINE) 10 MG tablet Take 1 tablet (10 mg total) by mouth every 6 (six) hours as needed for nausea or vomiting. 30 tablet 1  . XARELTO 15 MG TABS tablet TAKE 1 TABLET BY MOUTH DAILY WITH SUPPER. (Patient taking differently: Take 15 mg by mouth daily with supper. ) 30 tablet 3   No current facility-administered medications for this visit.    SURGICAL HISTORY:  Past Surgical History:  Procedure Laterality Date  . BIOPSY  01/08/2020   Procedure: BIOPSY;  Surgeon: Garner Nash, DO;  Location: [facility];  Service: Pulmonary;;  . BRONCHIAL BRUSHINGS  01/08/2020   Procedure: BRONCHIAL BRUSHINGS;  Surgeon: Garner Nash, DO;  Location: [facility];  Service: Pulmonary;;  . BRONCHIAL WASHINGS  01/08/2020   Procedure: BRONCHIAL WASHINGS;  Surgeon: Garner Nash, DO;  Location: [facility];  Service: Pulmonary;;  . CARDIAC CATHETERIZATION N/A 12/26/2015   Procedure: Left Heart Cath and Coronary Angiography;   Surgeon: Peter M [name], MD;  Location: [facility];  Service: Cardiovascular;  Laterality: N/A;  . CARDIOVERSION  05/2015  . CHOLECYSTECTOMY  2012  . CORONARY ANGIOPLASTY WITH STENT PLACEMENT  1990  . ENDOBRONCHIAL ULTRASOUND  01/08/2020   Procedure: ENDOBRONCHIAL ULTRASOUND;  Surgeon: Garner Nash, DO;  Location: [facility];  Service: Pulmonary;;  . FINE NEEDLE ASPIRATION BIOPSY  01/08/2020   Procedure: FINE NEEDLE ASPIRATION BIOPSY;  Surgeon: Garner Nash, DO;  Location: [facility];  Service: Pulmonary;;  . HERNIA REPAIR    . IR IMAGING GUIDED PORT INSERTION  02/18/2020  . PACEMAKER GENERATOR CHANGE  [id number], 2016  . PACEMAKER INSERTION  1991  . VIDEO BRONCHOSCOPY WITH ENDOBRONCHIAL ULTRASOUND N/A 01/08/2020   Procedure: VIDEO BRONCHOSCOPY;  Surgeon: Garner Nash, DO;  Location: [facility];  Service: Pulmonary;  Laterality: N/A;  REVIEW OF SYSTEMS:  Constitutional: positive for fatigue Eyes: negative Ears, nose, mouth, throat, and face: negative Respiratory: negative Cardiovascular: negative Gastrointestinal: positive for diarrhea Genitourinary:negative Integument/breast: negative Hematologic/lymphatic: negative Musculoskeletal:positive for muscle weakness Neurological: negative Behavioral/Psych: negative Endocrine: negative Allergic/Immunologic: negative   PHYSICAL EXAMINATION: General appearance: alert, cooperative, appears stated age, fatigued and no distress Head: Normocephalic, without obvious abnormality, atraumatic Neck: no adenopathy, no JVD, supple, symmetrical, trachea midline and thyroid not enlarged, symmetric, no tenderness/mass/nodules Lymph nodes: Cervical, supraclavicular, and axillary nodes normal. Resp: clear to auscultation bilaterally Back: symmetric, no curvature. ROM normal. No CVA tenderness. Cardio: regular rate and rhythm, S1, S2 normal, no murmur, click, rub or gallop GI: soft, non-tender; bowel sounds normal; no masses,  no  organomegaly Extremities: extremities normal, atraumatic, no cyanosis or edema Neurologic: Alert and oriented X 3, normal strength and tone. Normal symmetric reflexes. Normal coordination and gait  ECOG PERFORMANCE STATUS: 1 - Symptomatic but completely ambulatory  Blood pressure (!) 147/54, pulse 67, temperature 98 F (36.7 C), temperature source Oral, resp. rate 20, height 5\' 2"  (1.575 m), weight 116 lb 3.2 oz (52.7 kg), SpO2 100 %.  LABORATORY DATA: Lab Results  Component Value Date   WBC 10.6 (H) 04/25/2020   HGB 9.1 (L) 04/25/2020   HCT 28.6 (L) 04/25/2020   MCV 94.1 04/25/2020   PLT 70 (L) 04/25/2020      Chemistry      Component Value Date/Time   NA 144 04/25/2020 0847   NA 143 03/17/2020 0915   K 3.5 04/25/2020 0847   CL 112 (H) 04/25/2020 0847   CO2 25 04/25/2020 0847   BUN 13 04/25/2020 0847   BUN 6 (L) 03/17/2020 0915   CREATININE 0.69 04/25/2020 0847      Component Value Date/Time   CALCIUM 9.2 04/25/2020 0847   ALKPHOS 106 04/25/2020 0847   AST 14 (L) 04/25/2020 0847   ALT 21 04/25/2020 0847   BILITOT 0.3 04/25/2020 0847       RADIOGRAPHIC STUDIES: CT Chest W Contrast  Result Date: 04/29/2020 CLINICAL DATA:  Small cell lung cancer treated with chemotherapy. Mid abdominal pain. EXAM: CT CHEST, ABDOMEN, AND PELVIS WITH CONTRAST TECHNIQUE: Multidetector CT imaging of the chest, abdomen and pelvis was performed following the standard protocol during bolus administration of intravenous contrast. CONTRAST:  182mL OMNIPAQUE IOHEXOL 300 MG/ML  SOLN COMPARISON:  CT abdomen pelvis 03/18/2020 and CT chest 03/16/2020. PET 01/15/2020. FINDINGS: CT CHEST FINDINGS Cardiovascular: Left IJ Port-A-Cath terminates in the right atrium. Atherosclerotic calcification of the aorta and coronary arteries. Pulmonic trunk and heart are enlarged. No pericardial effusion. Mediastinum/Nodes: Mediastinal lymph nodes measure up to 11 mm in the low right paratracheal station, similar. Right  hilar lymph node measures 10 mm, stable. No left hilar or axillary adenopathy. Esophagus is grossly unremarkable. Lungs/Pleura: Centrilobular and paraseptal emphysema. Spiculated nodule in the medial aspect of the anterior segment right upper lobe is stable in size, measuring 1.9 x 2.4 cm (4/50). Posterior left lower lobe nodule has decreased slightly in size, now measuring 7 x 7 mm (4/75), previously 9 x 13 mm. Additional pulmonary nodular densities are unchanged. Scarring in the right middle lobe and both lower lobes. Small, minimally loculated right pleural effusion, similar. Left pleural effusion has resolved in the interval. Airway is unremarkable. Musculoskeletal: Degenerative changes in the spine. No worrisome lytic or sclerotic lesions. CT ABDOMEN PELVIS FINDINGS Hepatobiliary: Liver is unremarkable. Cholecystectomy. No biliary ductal dilatation. Pancreas: Negative. Spleen: Negative. Adrenals/Urinary Tract: Adrenal glands are unremarkable.  Stones and vascular calcifications in the kidneys bilaterally. Renal cortical scarring on the right. 12 mm low-attenuation lesion in the medial left kidney is likely a cyst. Ureters are decompressed. Bladder is low in volume and volume and largely obscured by streak artifact from a right hip arthroplasty. Stomach/Bowel: Stomach is unremarkable. There is dilated mid small bowel with gradual transition to normal caliber distal small bowel. Small bowel, appendix and colon are otherwise unremarkable. Vascular/Lymphatic: Atherosclerotic calcification of the aorta without aneurysm. No pathologically enlarged lymph nodes. Reproductive: Uterus is visualized.  No adnexal mass. Other: No free fluid.  Mesenteries and peritoneum are unremarkable. Musculoskeletal: Postoperative changes in the right femur. Degenerative changes in the spine. No worrisome lytic or sclerotic lesions. Specifically, no CT correlate for a hypermetabolic lesion seen in the left iliac wing on 01/15/2020.  IMPRESSION: 1. Right upper lobe nodule is stable. Left lower lobe nodule has decreased slightly in size in the interval. Mediastinal and hilar lymph nodes are stable. 2. Small, minimally loculated right pleural effusion, stable. Left pleural effusion has resolved. 3. No CT correlate in the left iliac wing to correspond to hypermetabolic lesion seen on 42/35/3614. 4. Nonspecific mid small bowel dilatation.  No transition point. 5. Bilateral renal stones. 6. Aortic atherosclerosis (ICD10-I70.0). Coronary artery calcification. 7. Enlarged pulmonic trunk, indicative of pulmonary arterial hypertension. 8.  Emphysema (ICD10-J43.9). Electronically Signed   By: Lorin Picket M.D.   On: 04/29/2020 16:07   CT Abdomen Pelvis W Contrast  Result Date: 04/29/2020 CLINICAL DATA:  Small cell lung cancer treated with chemotherapy. Mid abdominal pain. EXAM: CT CHEST, ABDOMEN, AND PELVIS WITH CONTRAST TECHNIQUE: Multidetector CT imaging of the chest, abdomen and pelvis was performed following the standard protocol during bolus administration of intravenous contrast. CONTRAST:  186mL OMNIPAQUE IOHEXOL 300 MG/ML  SOLN COMPARISON:  CT abdomen pelvis 03/18/2020 and CT chest 03/16/2020. PET 01/15/2020. FINDINGS: CT CHEST FINDINGS Cardiovascular: Left IJ Port-A-Cath terminates in the right atrium. Atherosclerotic calcification of the aorta and coronary arteries. Pulmonic trunk and heart are enlarged. No pericardial effusion. Mediastinum/Nodes: Mediastinal lymph nodes measure up to 11 mm in the low right paratracheal station, similar. Right hilar lymph node measures 10 mm, stable. No left hilar or axillary adenopathy. Esophagus is grossly unremarkable. Lungs/Pleura: Centrilobular and paraseptal emphysema. Spiculated nodule in the medial aspect of the anterior segment right upper lobe is stable in size, measuring 1.9 x 2.4 cm (4/50). Posterior left lower lobe nodule has decreased slightly in size, now measuring 7 x 7 mm (4/75),  previously 9 x 13 mm. Additional pulmonary nodular densities are unchanged. Scarring in the right middle lobe and both lower lobes. Small, minimally loculated right pleural effusion, similar. Left pleural effusion has resolved in the interval. Airway is unremarkable. Musculoskeletal: Degenerative changes in the spine. No worrisome lytic or sclerotic lesions. CT ABDOMEN PELVIS FINDINGS Hepatobiliary: Liver is unremarkable. Cholecystectomy. No biliary ductal dilatation. Pancreas: Negative. Spleen: Negative. Adrenals/Urinary Tract: Adrenal glands are unremarkable. Stones and vascular calcifications in the kidneys bilaterally. Renal cortical scarring on the right. 12 mm low-attenuation lesion in the medial left kidney is likely a cyst. Ureters are decompressed. Bladder is low in volume and volume and largely obscured by streak artifact from a right hip arthroplasty. Stomach/Bowel: Stomach is unremarkable. There is dilated mid small bowel with gradual transition to normal caliber distal small bowel. Small bowel, appendix and colon are otherwise unremarkable. Vascular/Lymphatic: Atherosclerotic calcification of the aorta without aneurysm. No pathologically enlarged lymph nodes. Reproductive: Uterus is visualized.  No adnexal  mass. Other: No free fluid.  Mesenteries and peritoneum are unremarkable. Musculoskeletal: Postoperative changes in the right femur. Degenerative changes in the spine. No worrisome lytic or sclerotic lesions. Specifically, no CT correlate for a hypermetabolic lesion seen in the left iliac wing on 01/15/2020. IMPRESSION: 1. Right upper lobe nodule is stable. Left lower lobe nodule has decreased slightly in size in the interval. Mediastinal and hilar lymph nodes are stable. 2. Small, minimally loculated right pleural effusion, stable. Left pleural effusion has resolved. 3. No CT correlate in the left iliac wing to correspond to hypermetabolic lesion seen on 92/08/9416. 4. Nonspecific mid small bowel  dilatation.  No transition point. 5. Bilateral renal stones. 6. Aortic atherosclerosis (ICD10-I70.0). Coronary artery calcification. 7. Enlarged pulmonic trunk, indicative of pulmonary arterial hypertension. 8.  Emphysema (ICD10-J43.9). Electronically Signed   By: Lorin Picket M.D.   On: 04/29/2020 16:07    ASSESSMENT AND PLAN: This is a very pleasant 85 years old African-American female recently diagnosed with extensive stage small cell lung cancer presented with central right upper lobe lung mass in addition to left lower lobe lung nodule and mediastinal lymphadenopathy and metastatic bone lesion in the left iliac wing diagnosed in April 2021. The patient started her treatment today with carboplatin for AUC of 5 on day 1, etoposide 100 mg/M2 on days 1, 2 and 3 with Neulasta support in addition to Imfinzi 1500 mg every 3 weeks during the chemotherapy course followed by 1500 mg every 4 weeks as maintenance if no evidence for disease progression.  Status post 4 cycles. The patient has been tolerating this treatment well with no concerning complaints except for few episodes of diarrhea. She had repeat CT scan of the chest, abdomen and pelvis performed recently.  I personally and independently reviewed the scans and discussed the results with the patient and her daughter. Her scan showed no concerning findings for disease progression. I recommended for the patient to proceed with the maintenance treatment with Imfinzi every 4 weeks. She will start the first cycle of this treatment today. She will come back for follow-up visit in 4 weeks for evaluation before the next cycle of her treatment. The patient will continue on Imodium for the diarrhea but if her diarrhea persisted and getting worse, may consider her for high-dose steroids. She was advised to call immediately if she has any other concerning symptoms in the interval. The patient voices understanding of current disease status and treatment options  and is in agreement with the current care plan.  All questions were answered. The patient knows to call the clinic with any problems, questions or concerns. We can certainly see the patient much sooner if necessary.  Disclaimer: This note was dictated with voice recognition software. Similar sounding words can inadvertently be transcribed and may not be corrected upon review.

## 2020-05-02 NOTE — Progress Notes (Signed)
  Chronic Care Management   Outreach Note  05/02/2020 Name: Lilliahna Schubring MRN: [id number] DOB: 04-09-1935  Referred by: [name], [name], MD Reason for referral : No chief complaint on file.   An unsuccessful telephone outreach was attempted today. The patient was referred to the pharmacist for assistance with care management and care coordination.   Follow Up Plan:   Carley Perdue [employer] Scheduler

## 2020-05-02 NOTE — Telephone Encounter (Signed)
Hypokalemia- Dtr stated [name] has not been taking kdur . " she was taken off Potassium in the hospital so she has not been taking any".  Per Dr Julien Nordmann ,  I instructed her to pick up 7 day rx at preferred pharmacy.  Cc Dr [name]

## 2020-05-03 ENCOUNTER — Telehealth: Payer: Self-pay | Admitting: Family Medicine

## 2020-05-03 NOTE — Progress Notes (Signed)
  Chronic Care Management   Outreach Note  05/03/2020 Name: [name] MRN: [id number] DOB: Mar 25, 1935  Referred by: [name], [name], MD Reason for referral : No chief complaint on file.   A second unsuccessful telephone outreach was attempted today. The patient was referred to pharmacist for assistance with care management and care coordination.  Follow Up Plan:   Carley Perdue [employer] Scheduler

## 2020-05-08 ENCOUNTER — Other Ambulatory Visit: Payer: Self-pay | Admitting: Cardiology

## 2020-05-09 ENCOUNTER — Telehealth: Payer: Self-pay | Admitting: Pharmacist

## 2020-05-09 MED ORDER — APIXABAN 2.5 MG PO TABS: 2.5000 mg | ORAL_TABLET | Freq: Two times a day (BID) | ORAL | 5 refills | Status: DC

## 2020-05-09 NOTE — Telephone Encounter (Signed)
Me     1:46 PM Note Spoke with patient's daughter and discussed reasoning for changing from Xarelto to Eliquis. Daughter states they can pick up Eliquis today. Will stop Xarelto and begin Eliquis 2.5mg  BID with first dose this evening. Free 30 day card sent to pharmacy as well. Follow up copays will be $4.    Me     1:41 PM Note [name], MD  [name], [name], RPH-CPP Switch to Eliquis 2.5 mg twice daily and discontinue Xarelto.     Me     8:24 AM Note Age 85, weight 52.7kg, scr 0.61 on 05/02/20, crcl 59. At last refill on 12/16/19, crcl was 62. Last visit may 2021, afib indication. SCr threshold for dose change is 0.73 for pt. She has had scr checked 5x in past 6 weeks, 2 SCr would qualify her for 15mg  dose, 3 would qualify her for 20mg  dose.  Will discuss with MD as slight changes in normal SCr will continue to change her required Xarelto dosing. Dr Curt Bears is off all week, msg sent to Dr Tamala Julian for input.

## 2020-05-09 NOTE — Telephone Encounter (Signed)
Spoke with patient's daughter and discussed reasoning for changing from Xarelto to [place]. Daughter states they can pick up Eliquis today. Will stop Xarelto and begin Eliquis 2.5mg  BID with first dose this evening. Free 30 day card sent to pharmacy as well. Follow up copays will be $4.

## 2020-05-09 NOTE — Telephone Encounter (Signed)
Robin Crome, MD  [name], [name], RPH-CPP Switch to Eliquis 2.5 mg twice daily and discontinue Xarelto.

## 2020-05-09 NOTE — Telephone Encounter (Signed)
Age 85, weight 52.7kg, scr 0.61 on 05/02/20, crcl 59. At last refill on 12/16/19, crcl was 62. Last visit may 2021, afib indication. SCr threshold for dose change is 0.73 for pt. She has had scr checked 5x in past 6 weeks, 2 SCr would qualify her for 15mg  dose, 3 would qualify her for 20mg  dose.  Will discuss with MD as slight changes in normal SCr will continue to change her required Xarelto dosing. Dr Curt Bears is off all week, msg sent to Dr Tamala Julian for input.

## 2020-05-11 ENCOUNTER — Ambulatory Visit (INDEPENDENT_AMBULATORY_CARE_PROVIDER_SITE_OTHER): Payer: Medicare Other | Admitting: *Deleted

## 2020-05-11 DIAGNOSIS — I700 Atherosclerosis of aorta: Secondary | ICD-10-CM

## 2020-05-11 LAB — CUP PACEART REMOTE DEVICE CHECK
Battery Remaining Longevity: 24 mo
Battery Remaining Percentage: 37 %
Brady Statistic RA Percent Paced: 26 %
Brady Statistic RV Percent Paced: 23 %
Date Time Interrogation Session: 20210811021100
Implantable Lead Implant Date: 19990610
Implantable Lead Implant Date: 20160713
Implantable Lead Location: 753859
Implantable Lead Location: 753860
Implantable Lead Model: 4136
Implantable Lead Serial Number: 29813363
Implantable Pulse Generator Implant Date: 20150130
Lead Channel Impedance Value: 286 Ohm
Lead Channel Impedance Value: 532 Ohm
Lead Channel Pacing Threshold Amplitude: 0.7 V
Lead Channel Pacing Threshold Amplitude: 1.1 V
Lead Channel Pacing Threshold Pulse Width: 0.4 ms
Lead Channel Pacing Threshold Pulse Width: 0.5 ms
Lead Channel Setting Pacing Amplitude: 1.3 V
Lead Channel Setting Pacing Amplitude: 2 V
Lead Channel Setting Pacing Pulse Width: 0.4 ms
Lead Channel Setting Sensing Sensitivity: 2 mV
Pulse Gen Serial Number: 393477

## 2020-05-12 ENCOUNTER — Telehealth: Payer: Self-pay

## 2020-05-12 NOTE — Progress Notes (Signed)
Chronic Care Management Pharmacy Assistant   Name: [name]  MRN: [id number] DOB: 04/16/1935  Reason for Encounter: Medication Review/ initial question for pharmacist visit.    PCP : [name], [name], MD  Allergies:  No Known Allergies  Medications: Outpatient Encounter Medications as of 05/12/2020  Medication Sig Note  . acetaminophen (TYLENOL) 500 MG tablet Take 500 mg by mouth every 6 (six) hours as needed for headache.   . albuterol (VENTOLIN HFA) 108 (90 Base) MCG/ACT inhaler TAKE 2 PUFFS BY MOUTH EVERY 6 HOURS AS NEEDED FOR WHEEZE OR SHORTNESS OF BREATH   . apixaban (ELIQUIS) 2.5 MG TABS tablet Take 1 tablet (2.5 mg total) by mouth 2 (two) times daily.   Marland Kitchen atorvastatin (LIPITOR) 10 MG tablet TAKE 1 TABLET BY MOUTH EVERY DAY   . carvedilol (COREG) 12.5 MG tablet TAKE 1 TABLET (12.5 MG TOTAL) BY MOUTH 2 (TWO) TIMES DAILY WITH A MEAL.   Marland Kitchen diclofenac sodium (VOLTAREN) 1 % GEL Apply 4 g topically 4 (four) times daily. (Patient taking differently: Apply 4 g topically 4 (four) times daily as needed (pain). )   . doxepin (SINEQUAN) 10 MG/ML solution Take 0.3 mLs (3 mg total) by mouth at bedtime.   . fenofibrate (TRICOR) 48 MG tablet TAKE 1 TABLET BY MOUTH EVERY DAY (Patient taking differently: Take 48 mg by mouth daily after lunch. )   . glucose blood (ONETOUCH VERIO) test strip Use to test 3-4 times daily.   Marland Kitchen levothyroxine (SYNTHROID) 112 MCG tablet TAKE 1 TABLET (112 MCG TOTAL) BY MOUTH DAILY BEFORE BREAKFAST.   Marland Kitchen lidocaine-prilocaine (EMLA) cream Apply 1 application topically as needed.   . linagliptin (TRADJENTA) 5 MG TABS tablet TAKE 1 TABLET (5 MG TOTAL) DAILY BY MOUTH. (Patient taking differently: Take 5 mg by mouth daily after breakfast. )   . losartan (COZAAR) 50 MG tablet Take 2 tablets by mouth daily. (Patient taking differently: Take 100 mg by mouth daily. )   . mupirocin ointment (BACTROBAN) 2 % Apply 1 application topically 2 (two) times daily.   . nitroGLYCERIN  (NITROSTAT) 0.4 MG SL tablet Place 1 tablet (0.4 mg total) under the tongue every 5 (five) minutes x 3 doses as needed for chest pain (Max 3 doses within 15 min. Call 911).   Marland Kitchen oxyCODONE-acetaminophen (PERCOCET/ROXICET) 5-325 MG tablet Take 1 tablet by mouth every 8 (eight) hours as needed for severe pain. 02/18/2020: Not started  . pantoprazole (PROTONIX) 40 MG tablet TAKE 1 TABLET BY MOUTH EVERY DAY   . potassium chloride SA (KLOR-CON) 20 MEQ tablet Take 1 tablet (20 mEq total) by mouth daily.   . prochlorperazine (COMPAZINE) 10 MG tablet Take 1 tablet (10 mg total) by mouth every 6 (six) hours as needed for nausea or vomiting.    No facility-administered encounter medications on file as of 05/12/2020.    Current Diagnosis: Patient Active Problem List   Diagnosis Date Noted  . Epigastric abdominal pain 04/27/2020  . Port-A-Cath in place 04/11/2020  . Small cell lung cancer, right ([facility]) 01/28/2020  . Encounter for antineoplastic chemotherapy 01/28/2020  . Encounter for antineoplastic immunotherapy 01/28/2020  . Goals of care, counseling/discussion 01/28/2020  . Lung cancer ([place]) 01/06/2020  . Near syncope 01/05/2020  . Mediastinal lymphadenopathy 01/01/2020  . Multiple lung nodules on CT 01/01/2020  . Atherosclerosis of aorta ([place]) 12/28/2019  . Chronic diarrhea 12/15/2019  . Hyperlipidemia associated with type 2 diabetes mellitus ([place]) 06/10/2019  . Chronic rhinitis 02/28/2018  . Tobacco  use disorder 05/10/2017  . Hypertension with heart disease 05/10/2017  . Chest pain 03/10/2017  . Acute thoracic back pain   . Cardiac pacemaker in situ 12/24/2015  . History of Graves' disease 12/24/2015  . Current use of long term anticoagulation 12/24/2015  . Type 2 diabetes mellitus with other specified complication ([place])   . Hypothyroidism 03/23/2010  . Paroxysmal atrial fibrillation ([place]) 03/23/2010  . COPD (chronic obstructive pulmonary disease) ([place]) 03/23/2010  . CAD (coronary artery  disease), native coronary artery   . GERD     Goals Addressed   None     Follow-Up:  Pharmacist Review   Have you seen any other providers since your last visit? Yes, 05/02/2020 Curt Bears - Oncology Any changes in your medications or health? 05/09/2020 [facility] -cardiology , stopped Xarelto ,started Eliquis 2.5mg  BID  Any side effects from any medications? no Do you have an symptoms or problems not managed by your medications?   Patient stated she is suffering from severe diarrhea.  Any concerns about your health right now?   Patient stated she is concern about her cancer and diarrhea Has your provider asked that you check blood pressure, blood sugar, or follow special diet at home?   Patient reported she use to take her blood pressure at home but has not recently due to her going back and fourths to her doctors.   Patient stated she mostly eats home cook food with no salt .Patient stated she likes soup,pasta but can not really tolerate ground beef anymore.  Patient stated she does not have a big appetite and does not eat as much due to chemo. Do you get any type of exercise on a regular basis? NO  Can you think of a goal you would like to reach for your health? None ID Do you have any problems getting your medications? no Is there anything that you would like to discuss during the appointment? Diarrhea  Please bring medications and supplements to appointment  [place] Pharmacist Assistant [phone]

## 2020-05-13 NOTE — Chronic Care Management (AMB) (Deleted)
Chronic Care Management Pharmacy  Name: [name]  MRN: [id number] DOB: 06/27/1935   Chief Complaint/ HPI  [name],  85 y.o. , female presents for their Initial CCM visit with the clinical pharmacist via telephone.  PCP : [name], [name], MD Patient Care Team: [name], [name], MD as PCP - General (Family Medicine) Constance Haw, MD as PCP - Electrophysiology (Cardiology) [name], MD as PCP - Cardiology (Cardiology) Germaine Pomfret, [facility] as Pharmacist (Pharmacist)  Their chronic conditions include: Hypertension, Hyperlipidemia, Diabetes, Atrial Fibrillation, Coronary Artery Disease, GERD, COPD and Lung Cancer   Office Visits: ***  Consult Visit: ***  No Known Allergies  Medications: Outpatient Encounter Medications as of 05/16/2020  Medication Sig Note  . acetaminophen (TYLENOL) 500 MG tablet Take 500 mg by mouth every 6 (six) hours as needed for headache.   . albuterol (VENTOLIN HFA) 108 (90 Base) MCG/ACT inhaler TAKE 2 PUFFS BY MOUTH EVERY 6 HOURS AS NEEDED FOR WHEEZE OR SHORTNESS OF BREATH   . apixaban (ELIQUIS) 2.5 MG TABS tablet Take 1 tablet (2.5 mg total) by mouth 2 (two) times daily.   Marland Kitchen atorvastatin (LIPITOR) 10 MG tablet TAKE 1 TABLET BY MOUTH EVERY DAY   . carvedilol (COREG) 12.5 MG tablet TAKE 1 TABLET (12.5 MG TOTAL) BY MOUTH 2 (TWO) TIMES DAILY WITH A MEAL.   Marland Kitchen diclofenac sodium (VOLTAREN) 1 % GEL Apply 4 g topically 4 (four) times daily. (Patient taking differently: Apply 4 g topically 4 (four) times daily as needed (pain). )   . doxepin (SINEQUAN) 10 MG/ML solution Take 0.3 mLs (3 mg total) by mouth at bedtime.   . fenofibrate (TRICOR) 48 MG tablet TAKE 1 TABLET BY MOUTH EVERY DAY (Patient taking differently: Take 48 mg by mouth daily after lunch. )   . glucose blood (ONETOUCH VERIO) test strip Use to test 3-4 times daily.   Marland Kitchen levothyroxine (SYNTHROID) 112 MCG tablet TAKE 1 TABLET (112 MCG TOTAL) BY MOUTH DAILY BEFORE BREAKFAST.   Marland Kitchen  lidocaine-prilocaine (EMLA) cream Apply 1 application topically as needed.   . linagliptin (TRADJENTA) 5 MG TABS tablet TAKE 1 TABLET (5 MG TOTAL) DAILY BY MOUTH. (Patient taking differently: Take 5 mg by mouth daily after breakfast. )   . losartan (COZAAR) 50 MG tablet Take 2 tablets by mouth daily. (Patient taking differently: Take 100 mg by mouth daily. )   . mupirocin ointment (BACTROBAN) 2 % Apply 1 application topically 2 (two) times daily.   . nitroGLYCERIN (NITROSTAT) 0.4 MG SL tablet Place 1 tablet (0.4 mg total) under the tongue every 5 (five) minutes x 3 doses as needed for chest pain (Max 3 doses within 15 min. Call 911).   Marland Kitchen oxyCODONE-acetaminophen (PERCOCET/ROXICET) 5-325 MG tablet Take 1 tablet by mouth every 8 (eight) hours as needed for severe pain. 02/18/2020: Not started  . pantoprazole (PROTONIX) 40 MG tablet TAKE 1 TABLET BY MOUTH EVERY DAY   . potassium chloride SA (KLOR-CON) 20 MEQ tablet Take 1 tablet (20 mEq total) by mouth daily.   . prochlorperazine (COMPAZINE) 10 MG tablet Take 1 tablet (10 mg total) by mouth every 6 (six) hours as needed for nausea or vomiting.    No facility-administered encounter medications on file as of 05/16/2020.     Current Diagnosis/Assessment:    Goals Addressed   None     COPD / Lung Cancer   Last spirometry score: ***  Gold Grade: {CHL HP Upstream Pharm COPD Gold TMLYY:[id number]} Current  COPD Classification:  {CHL HP Upstream Pharm COPD Classification:[phone]}  Eosinophil count:   Lab Results  Component Value Date/Time   EOSPCT 1 05/02/2020 09:02 AM  %                               Eos (Absolute):  Lab Results  Component Value Date/Time   EOSABS 0.1 05/02/2020 09:02 AM    Tobacco Status:  Social History   Tobacco Use  Smoking Status Former Smoker  . Packs/day: 0.75  . Years: 66.00  . Pack years: 49.50  . Quit date: 12/27/2019  . Years since quitting: 0.3  Smokeless Tobacco Never Used    Patient has failed  these meds in past: *** Patient is currently {CHL Controlled/Uncontrolled:[phone]} on the following medications: *** . Albuterol HFA 108 mcg/act 2 puff q6hr PRN   Using maintenance inhaler regularly? {yes/no:20286} Frequency of rescue inhaler use:  {CHL HP Upstream Pharm Inhaler KVTX:5217471595}  We discussed:  {CHL HP Upstream Pharmacy discussion:[phone]}  Plan  Continue {CHL HP Upstream Pharmacy Plans:4407043698}  Diabetes   A1c goal {A1c goals:23924}  Recent Relevant Labs: Lab Results  Component Value Date/Time   HGBA1C 5.4 04/27/2020 02:35 PM   HGBA1C 6.5 09/30/2019 08:26 AM   HGBA1C 6.2 06/10/2019 09:29 AM   GFR 117.94 12/15/2019 11:23 AM   GFR 85.50 09/30/2019 08:26 AM   MICROALBUR 6.0 (H) 06/10/2019 09:29 AM   MICROALBUR 4.2 (H) 11/12/2018 09:56 AM    Last diabetic Eye exam: No results found for: HMDIABEYEEXA  Last diabetic Foot exam: No results found for: HMDIABFOOTEX   Checking BG: {CHL HP Blood Glucose Monitoring Frequency:[phone]}  Recent FBG Readings: *** Recent pre-meal BG readings: *** Recent 2hr PP BG readings:  *** Recent HS BG readings: ***  Patient has failed these meds in past: *** Patient is currently {CHL Controlled/Uncontrolled:[phone]} on the following medications: . Linagliptin 5 mg daily   We discussed: {CHL HP Upstream Pharmacy discussion:[phone]}  Plan  Continue {CHL HP Upstream Pharmacy Plans:4407043698}  Hypertension   BP goal is:  {CHL HP UPSTREAM Pharmacist BP [phone]}  Office blood pressures are  BP Readings from Last 3 Encounters:  05/02/20 (!) 147/54  04/27/20 (!) 120/60  04/15/20 (!) 129/48   CMP Latest Ref Rng & Units 05/02/2020 04/25/2020 04/11/2020  Glucose 70 - 99 mg/dL 84 95 85  BUN 8 - 23 mg/dL _0 Creatinine 0.44 - 1.00 mg/dL 0.61 0.69 0.74  Sodium 135 - 145 mmol/L 144 144 143  Potassium 3.5 - 5.1 mmol/L 3.1(L) 3.5 3.9  Chloride 98 - 111 mmol/L 113(H) 112(H) 110  CO2 22 - 32 mmol/L _1 Calcium 8.9 - 10.3 mg/dL 9.2 9.2 9.1  Total Protein 6.5 - 8.1 g/dL 5.9(L) 6.1(L) 6.3(L)  Total Bilirubin 0.3 - 1.2 mg/dL 0.5 0.3 0.4  Alkaline Phos 38 - 126 U/L 82 106 86  AST 15 - 41 U/L 13(L) 14(L) 13(L)  ALT 0 - 44 U/L _2 Patient checks BP at home {CHL HP BP Monitoring Frequency:[phone]} Patient home BP readings are ranging: ***  Patient has failed these meds in the past: *** Patient is currently {CHL Controlled/Uncontrolled:[phone]} on the following medications:  . Carvedilol 12.5 mg BID  . Losartan 50 mg two tablets daily   We discussed {CHL HP Upstream Pharmacy discussion:[phone]}  Plan  Continue {CHL HP Upstream Pharmacy Plans:4407043698}   AFIB  Patient is currently rate controlled.  Patient has failed these meds in past: n/a Patient is currently controlled on the following medications:  . Carvedilol 12.5 mg BID  . Eliquis 2.5 mg BID   We discussed:  {CHL HP Upstream Pharmacy [phone]}  Plan  Continue {CHL HP Upstream Pharmacy Plans:[phone]}    Hyperlipidemia   LDL goal < 70  Lipid Panel     Component Value Date/Time   CHOL 156 06/10/2019 0929   TRIG 79.0 06/10/2019 0929   HDL 64.30 06/10/2019 0929   LDLCALC 76 06/10/2019 0929    Hepatic Function Latest Ref Rng & Units 05/02/2020 04/25/2020 04/11/2020  Total Protein 6.5 - 8.1 g/dL 5.9(L) 6.1(L) 6.3(L)  Albumin 3.5 - 5.0 g/dL 3.4(L) 3.4(L) 3.4(L)  AST 15 - 41 U/L 13(L) 14(L) 13(L)  ALT 0 - 44 U/L _0 Alk Phosphatase 38 - 126 U/L 82 106 86  Total Bilirubin 0.3 - 1.2 mg/dL 0.5 0.3 0.4  Bilirubin, Direct 0.1 - 0.5 mg/dL - - -     The ASCVD Risk score Mikey Bussing DC Jr., et al., 2013) failed to calculate for the following reasons:   The 2013 ASCVD risk score is only valid for ages 29 to 77   Patient has failed these meds in past: *** Patient is currently uncontrolled on the following medications:  . Atorvastatin 10 mg daily  . Fenofibrate 48 mg daily   We  discussed:  {CHL HP Upstream Pharmacy [phone]}  Plan  Continue {CHL HP Upstream Pharmacy Plans:[phone]}  Hypothyroidism   Lab Results  Component Value Date/Time   TSH 2.282 05/02/2020 09:02 AM   TSH 3.651 04/11/2020 08:41 AM   TSH 0.64 06/10/2019 09:29 AM   TSH 0.42 10/09/2017 09:47 AM   FREET4 1.19 05/10/2017 10:59 AM    Patient has failed these meds in past: *** Patient is currently {CHL Controlled/[phone]} on the following medications:  . Levothyroxine 112 mcg daily   We discussed:  {CHL HP Upstream Pharmacy [phone]}  Plan  Continue {CHL HP Upstream Pharmacy Plans:[phone]}  GERD   Patient has failed these meds in past: *** Patient is currently {CHL Controlled/[phone]} on the following medications:  . Pantoprazole 40 mg daily   We discussed:  ***  Plan  Continue {CHL HP Upstream Pharmacy Plans:[phone]}   Misc / OTC   Patient has failed these meds in past: *** Patient is currently {CHL Controlled/[phone]} on the following medications:  . Acetaminophen 500 mg q6hr PRN  . Diclofenac 1% gel  . Doxepin 10 mg/mL 3 mg QHS  . Emla cream  . Mupirocin 2% ointment BID  . Nitroglycerin 0.4 mg SL PRN . Oxycodone-APAP 5-325 mg q8hr PRN  .  Potassium Chloride 20 mEq daily  . Prochlorperazine 10 mg q6hr PRN   We discussed:  ***  Plan  Continue {CHL HP Upstream Pharmacy DJMEQ:[id number]}   Medication Management   Pt uses *** pharmacy for all medications Uses pill box? {Yes or If no, why not?:20788} Pt endorses ***% compliance  We discussed: ***  Plan  {US Pharmacy LNLG:92119}    Follow up: *** month phone visit  ***

## 2020-05-13 NOTE — Progress Notes (Signed)
Remote pacemaker transmission.   

## 2020-05-16 ENCOUNTER — Telehealth: Payer: Medicare Other

## 2020-05-16 ENCOUNTER — Telehealth: Payer: Self-pay

## 2020-05-16 DIAGNOSIS — E1169 Type 2 diabetes mellitus with other specified complication: Secondary | ICD-10-CM

## 2020-05-16 DIAGNOSIS — E038 Other specified hypothyroidism: Secondary | ICD-10-CM

## 2020-05-16 DIAGNOSIS — E785 Hyperlipidemia, unspecified: Secondary | ICD-10-CM

## 2020-05-16 NOTE — Telephone Encounter (Signed)
-----   Message from Germaine Pomfret, [facility] sent at 05/13/2020  8:05 AM EDT ----- Regarding: CCM Pharmacist Referral Shakisha Abend,  Can you please place a referral to chronic care management for this patient?  Thanks, Doristine Section Clinical Pharmacist [facility] [place]

## 2020-05-17 ENCOUNTER — Other Ambulatory Visit: Payer: Self-pay

## 2020-05-17 MED ORDER — LOSARTAN POTASSIUM 50 MG PO TABS: ORAL_TABLET | ORAL | 1 refills | Status: DC

## 2020-05-18 ENCOUNTER — Telehealth: Payer: Self-pay | Admitting: Family Medicine

## 2020-05-18 NOTE — Progress Notes (Signed)
°  Chronic Care Management   Outreach Note  05/18/2020 Name: Sumayyah Custodio MRN: [id number] DOB: [birth year]  Referred by: [name], [name], MD Reason for referral : No chief complaint on file.   An unsuccessful telephone outreach was attempted today. The patient was referred to the pharmacist for assistance with care management and care coordination.   Follow Up Plan:   Carley Perdue [facility] Scheduler

## 2020-05-20 ENCOUNTER — Telehealth: Payer: Self-pay | Admitting: Family Medicine

## 2020-05-20 ENCOUNTER — Telehealth: Payer: Self-pay | Admitting: *Deleted

## 2020-05-20 NOTE — Telephone Encounter (Signed)
-----   Message from Will Meredith Leeds, MD sent at 05/16/2020  3:25 PM EDT ----- Normal remote reviewed. Battery and lead parameters stable.  AF burden significantly elevated.  Will need follow-up in AF clinic to discuss the possibility of rhythm control.

## 2020-05-20 NOTE — Telephone Encounter (Signed)
appt made per daughter request for 8/26.

## 2020-05-20 NOTE — Progress Notes (Signed)
  Chronic Care Management   Note  05/20/2020 Name: [name] MRN: [id number] DOB: 08-26-1935  [name] is a 85 y.o. year old female who is a primary care patient of [name], Malka So, MD. I reached out to Elyse Hsu by phone today in response to a referral sent by [name] PCP, [name], [name], MD.   [name] was given information about Chronic Care Management services today including:  1. CCM service includes personalized support from designated clinical staff supervised by her physician, including individualized plan of care and coordination with other care providers 2. 24/7 contact phone numbers for assistance for urgent and routine care needs. 3. Service will only be billed when office clinical staff spend 20 minutes or more in a month to coordinate care. 4. Only one practitioner may furnish and bill the service in a calendar month. 5. The patient may stop CCM services at any time (effective at the end of the month) by phone call to the office staff.   Patient agreed to services and verbal consent obtained.   Follow up plan:   Carley Perdue [facility] Scheduler

## 2020-05-20 NOTE — Telephone Encounter (Signed)
Dtr, [name], made aware of transmission findings and Dr. Curt Bears recommendation. Aware AFC will call her to arrange appt She is agreeable to plan.   Marzetta Board - call dtr, [name], to arrange OV.  She is aware you will contact her)

## 2020-05-25 ENCOUNTER — Other Ambulatory Visit: Payer: Self-pay | Admitting: Family Medicine

## 2020-05-25 DIAGNOSIS — J42 Unspecified chronic bronchitis: Secondary | ICD-10-CM

## 2020-05-26 ENCOUNTER — Encounter (HOSPITAL_COMMUNITY): Payer: Self-pay | Admitting: Nurse Practitioner

## 2020-05-26 ENCOUNTER — Other Ambulatory Visit: Payer: Self-pay

## 2020-05-26 ENCOUNTER — Ambulatory Visit (HOSPITAL_COMMUNITY)
Admission: RE | Admit: 2020-05-26 | Discharge: 2020-05-26 | Disposition: A | Discharge status: Home / Self Care | Payer: Medicare Other | Source: Ambulatory Visit | Attending: Nurse Practitioner | Admitting: Nurse Practitioner

## 2020-05-26 VITALS — BP 118/50 | HR 65 | Ht 62.0 in | Wt 110.2 lb

## 2020-05-26 DIAGNOSIS — I2510 Atherosclerotic heart disease of native coronary artery without angina pectoris: Secondary | ICD-10-CM | POA: Insufficient documentation

## 2020-05-26 DIAGNOSIS — Z79899 Other long term (current) drug therapy: Secondary | ICD-10-CM | POA: Diagnosis not present

## 2020-05-26 DIAGNOSIS — J449 Chronic obstructive pulmonary disease, unspecified: Secondary | ICD-10-CM | POA: Insufficient documentation

## 2020-05-26 DIAGNOSIS — Z955 Presence of coronary angioplasty implant and graft: Secondary | ICD-10-CM | POA: Diagnosis not present

## 2020-05-26 DIAGNOSIS — I700 Atherosclerosis of aorta: Secondary | ICD-10-CM | POA: Diagnosis not present

## 2020-05-26 DIAGNOSIS — E039 Hypothyroidism, unspecified: Secondary | ICD-10-CM | POA: Diagnosis not present

## 2020-05-26 DIAGNOSIS — C7951 Secondary malignant neoplasm of bone: Secondary | ICD-10-CM | POA: Insufficient documentation

## 2020-05-26 DIAGNOSIS — C7802 Secondary malignant neoplasm of left lung: Secondary | ICD-10-CM | POA: Diagnosis not present

## 2020-05-26 DIAGNOSIS — Z7901 Long term (current) use of anticoagulants: Secondary | ICD-10-CM | POA: Diagnosis not present

## 2020-05-26 DIAGNOSIS — I495 Sick sinus syndrome: Secondary | ICD-10-CM | POA: Diagnosis not present

## 2020-05-26 DIAGNOSIS — I25119 Atherosclerotic heart disease of native coronary artery with unspecified angina pectoris: Secondary | ICD-10-CM | POA: Diagnosis not present

## 2020-05-26 DIAGNOSIS — Z791 Long term (current) use of non-steroidal anti-inflammatories (NSAID): Secondary | ICD-10-CM | POA: Insufficient documentation

## 2020-05-26 DIAGNOSIS — I4819 Other persistent atrial fibrillation: Secondary | ICD-10-CM | POA: Diagnosis not present

## 2020-05-26 DIAGNOSIS — K529 Noninfective gastroenteritis and colitis, unspecified: Secondary | ICD-10-CM | POA: Diagnosis not present

## 2020-05-26 DIAGNOSIS — D6869 Other thrombophilia: Secondary | ICD-10-CM | POA: Diagnosis not present

## 2020-05-26 DIAGNOSIS — I10 Essential (primary) hypertension: Secondary | ICD-10-CM | POA: Insufficient documentation

## 2020-05-26 DIAGNOSIS — Z8249 Family history of ischemic heart disease and other diseases of the circulatory system: Secondary | ICD-10-CM | POA: Diagnosis not present

## 2020-05-26 DIAGNOSIS — Z9049 Acquired absence of other specified parts of digestive tract: Secondary | ICD-10-CM | POA: Insufficient documentation

## 2020-05-26 DIAGNOSIS — C3411 Malignant neoplasm of upper lobe, right bronchus or lung: Secondary | ICD-10-CM | POA: Diagnosis not present

## 2020-05-26 DIAGNOSIS — C778 Secondary and unspecified malignant neoplasm of lymph nodes of multiple regions: Secondary | ICD-10-CM | POA: Insufficient documentation

## 2020-05-26 DIAGNOSIS — Z87891 Personal history of nicotine dependence: Secondary | ICD-10-CM | POA: Insufficient documentation

## 2020-05-26 DIAGNOSIS — Z7984 Long term (current) use of oral hypoglycemic drugs: Secondary | ICD-10-CM | POA: Diagnosis not present

## 2020-05-26 DIAGNOSIS — Z950 Presence of cardiac pacemaker: Secondary | ICD-10-CM | POA: Insufficient documentation

## 2020-05-26 DIAGNOSIS — E119 Type 2 diabetes mellitus without complications: Secondary | ICD-10-CM | POA: Diagnosis not present

## 2020-05-26 MED ORDER — NITROGLYCERIN 0.4 MG SL SUBL: 0.4000 mg | SUBLINGUAL_TABLET | SUBLINGUAL | 0 refills | Status: AC | PRN

## 2020-05-26 NOTE — Progress Notes (Signed)
[facility] OFFICE PROGRESS NOTE  [name], [name], MD [place] [address]  DIAGNOSIS: Extensive stage (T2 a, N2, M1 C) small cell lung cancer presented with central right upper lobe lung mass in addition to left lower lobe pulmonary metastasis and bilateral hilar, subcarinal and bilateral paratracheal and left prevascular lymphadenopathy in addition to bone metastasis in the left iliac wing diagnosed in April 2021.  PRIOR THERAPY: None  CURRENT THERAPY: Systemic chemotherapy with carboplatin for AUC of 5 on day 1, etoposide 100 mg/M2 on days 1, 2 and 3 with Neulasta support in addition to Imfinzi 1500 mg IV every 3 weeks during chemotherapy followed by maintenance Imfinzi 1500 mg IV every 4 weeks after cycle #4.Status post 5 cycles. She started maintenance immunotherapy with single agent Imfinzi starting from cycle #5  INTERVAL HISTORY: [name] 85 y.o. female returns for to the clinic today for a follow up visit. The patient is feeling well today without any concerning complaints except she has intermittent chronic abdominal cramping and diarrhea after she eats which started prior to her starting chemotherapy/immunotherapy. She states after she eat she has abdominal cramping and often needs to rush to the bathroom due to diarrhea. Therefore, the patient takes 2 imodium daily with fairly good control of her diarrhea. Once she has a bowel movement, her abdominal cramping subsides. Denies blood in the stool, fevers, nausea or vomiting. She also takes protonix.   Otherwise, she continues to tolerate treatment well without any concerning adverse side effects. She denies any recent fever, chills, or night sweats. She lost about 5 lbs since her last appointment though. She denies any recent cough or hemoptysis. Her breathing/dyspnea on exertion is greatly improved. When she first started treatment, she was on supplemental oxygen. Today, she no longer requires  supplemental oxygen and she is 100% on room air.  She denies any vomiting or constipation. She denies any headache or visual changes. She received a blood transfusion in the interval since her last cycle of treatment.  She is here today for evaluation before starting cycle #6.   MEDICAL HISTORY: Past Medical History:  Diagnosis Date  . CAD (coronary artery disease), native coronary artery    PTCA of distal right coronary artery 1997 [place] stents to circumflex 2005 Cardiac cath in 2011 with patent stent to circumflex and moderate disease elsewhere treated medically   . Cancer ([place])   . Cardiac pacemaker in situ 12/24/2015   Original implant reportedly in 1991 for tachybradycardia syndrome, generator change in 1999, 2004 and evidently again in 2016 in [place]   . COPD (chronic obstructive pulmonary disease) ([facility])   . Diabetes mellitus without complication ([facility])   . Hypertension   . Hypothyroidism 03/23/2010  . Pacemaker   . Paroxysmal atrial fibrillation ([facility]) 03/23/2010   CHA2DS2VASC score 5     ALLERGIES:  has No Known Allergies.  MEDICATIONS:  Current Outpatient Medications  Medication Sig Dispense Refill  . acetaminophen (TYLENOL) 500 MG tablet Take 500 mg by mouth every 6 (six) hours as needed for headache.    . albuterol (VENTOLIN HFA) 108 (90 Base) MCG/ACT inhaler TAKE 2 PUFFS BY MOUTH EVERY 6 HOURS AS NEEDED FOR WHEEZE OR SHORTNESS OF BREATH 1 each 1  . apixaban (ELIQUIS) 2.5 MG TABS tablet Take 1 tablet (2.5 mg total) by mouth 2 (two) times daily. 60 tablet 5  . atorvastatin (LIPITOR) 10 MG tablet TAKE 1 TABLET BY MOUTH EVERY DAY 90 tablet 3  .  carvedilol (COREG) 12.5 MG tablet TAKE 1 TABLET (12.5 MG TOTAL) BY MOUTH 2 (TWO) TIMES DAILY WITH A MEAL. 180 tablet 2  . diclofenac sodium (VOLTAREN) 1 % GEL Apply 4 g topically 4 (four) times daily. (Patient taking differently: Apply 4 g topically 4 (four) times daily as needed (pain). ) 4 Tube 3  . doxepin (SINEQUAN) 10 MG/ML solution  Take 0.3 mLs (3 mg total) by mouth at bedtime. 30 mL 1  . fenofibrate (TRICOR) 48 MG tablet TAKE 1 TABLET BY MOUTH EVERY DAY (Patient taking differently: Take 48 mg by mouth daily after lunch. ) 90 tablet 3  . glucose blood (ONETOUCH VERIO) test strip Use to test 3-4 times daily. 300 strip 4  . levothyroxine (SYNTHROID) 112 MCG tablet TAKE 1 TABLET (112 MCG TOTAL) BY MOUTH DAILY BEFORE BREAKFAST. 90 tablet 2  . lidocaine-prilocaine (EMLA) cream Apply 1 application topically as needed. 30 g 2  . linagliptin (TRADJENTA) 5 MG TABS tablet TAKE 1 TABLET (5 MG TOTAL) DAILY BY MOUTH. (Patient taking differently: Take 5 mg by mouth daily after breakfast. ) 90 tablet 1  . Loperamide HCl (IMODIUM PO) Take by mouth as needed.    Marland Kitchen losartan (COZAAR) 50 MG tablet Take 2 tablets by mouth daily. 180 tablet 1  . nitroGLYCERIN (NITROSTAT) 0.4 MG SL tablet Place 1 tablet (0.4 mg total) under the tongue every 5 (five) minutes x 3 doses as needed for chest pain (Max 3 doses within 15 min. Call 911). 10 tablet 0  . oxyCODONE-acetaminophen (PERCOCET/ROXICET) 5-325 MG tablet Take 1 tablet by mouth every 8 (eight) hours as needed for severe pain. (Patient taking differently: Take 1 tablet by mouth as needed for severe pain. ) 20 tablet 0  . pantoprazole (PROTONIX) 40 MG tablet TAKE 1 TABLET BY MOUTH EVERY DAY 90 tablet 1  . potassium chloride SA (KLOR-CON) 20 MEQ tablet Take 1 tablet (20 mEq total) by mouth daily. 7 tablet 0  . prochlorperazine (COMPAZINE) 10 MG tablet Take 1 tablet (10 mg total) by mouth every 6 (six) hours as needed for nausea or vomiting. 30 tablet 1   No current facility-administered medications for this visit.    SURGICAL HISTORY:  Past Surgical History:  Procedure Laterality Date  . BIOPSY  01/08/2020   Procedure: BIOPSY;  Surgeon: Garner Nash, DO;  Location: [facility];  Service: Pulmonary;;  . BRONCHIAL BRUSHINGS  01/08/2020   Procedure: BRONCHIAL BRUSHINGS;  Surgeon: Garner Nash, DO;   Location: [facility];  Service: Pulmonary;;  . BRONCHIAL WASHINGS  01/08/2020   Procedure: BRONCHIAL WASHINGS;  Surgeon: Garner Nash, DO;  Location: [facility];  Service: Pulmonary;;  . CARDIAC CATHETERIZATION N/A 12/26/2015   Procedure: Left Heart Cath and Coronary Angiography;  Surgeon: Peter M [name], MD;  Location: [facility];  Service: Cardiovascular;  Laterality: N/A;  . CARDIOVERSION  05/2015  . CHOLECYSTECTOMY  2012  . CORONARY ANGIOPLASTY WITH STENT PLACEMENT  1990  . ENDOBRONCHIAL ULTRASOUND  01/08/2020   Procedure: ENDOBRONCHIAL ULTRASOUND;  Surgeon: Garner Nash, DO;  Location: [facility];  Service: Pulmonary;;  . FINE NEEDLE ASPIRATION BIOPSY  01/08/2020   Procedure: FINE NEEDLE ASPIRATION BIOPSY;  Surgeon: Garner Nash, DO;  Location: [facility];  Service: Pulmonary;;  . HERNIA REPAIR    . IR IMAGING GUIDED PORT INSERTION  02/18/2020  . PACEMAKER GENERATOR CHANGE  [id number], 2016  . PACEMAKER INSERTION  1991  . VIDEO BRONCHOSCOPY WITH ENDOBRONCHIAL ULTRASOUND N/A 01/08/2020  Procedure: VIDEO BRONCHOSCOPY;  Surgeon: Garner Nash, DO;  Location: [facility];  Service: Pulmonary;  Laterality: N/A;    REVIEW OF SYSTEMS:   Review of Systems  Constitutional: Positive for weight loss. Negative for appetite change, chills, fatigue, and fever.  HENT: Negative for mouth sores, nosebleeds, sore throat and trouble swallowing.   Eyes: Negative for eye problems and icterus.  Respiratory: Negative for cough, hemoptysis, shortness of breath and wheezing.   Cardiovascular: Negative for chest pain and leg swelling.  Gastrointestinal: Positive for chronic diarrhea/abdominal cramping. Negative for constipation, nausea and vomiting.  Genitourinary: Negative for bladder incontinence, difficulty urinating, dysuria, frequency and hematuria.   Musculoskeletal: Negative for back pain, gait problem, neck pain and neck stiffness.  Skin: Negative for itching and rash.   Neurological: Negative for dizziness, extremity weakness, gait problem, headaches, light-headedness and seizures.  Hematological: Negative for adenopathy. Does not bruise/bleed easily.  Psychiatric/Behavioral: Negative for confusion, depression and sleep disturbance. The patient is not nervous/anxious.      PHYSICAL EXAMINATION:  Blood pressure (!) 101/45, pulse 65, temperature 98.6 F (37 C), temperature source Tympanic, resp. rate 17, height 5\' 2"  (1.575 m), weight 111 lb 9.6 oz (50.6 kg), SpO2 100 %.  ECOG PERFORMANCE STATUS: 1 - Symptomatic but completely ambulatory  Physical Exam  Constitutional: Oriented to person, place, and time and well-developed, well-nourished, and in no distress.  HENT:  Head: Normocephalic and atraumatic.  Mouth/Throat: Oropharynx is clear and moist. No oropharyngeal exudate.  Eyes: Conjunctivae are normal. Right eye exhibits no discharge. Left eye exhibits no discharge. No scleral icterus.  Neck: Normal range of motion. Neck supple.  Cardiovascular: Normal rate, regular rhythm, normal heart sounds and intact distal pulses.  Pulmonary/Chest: Effort normal. Quiet breath sounds in all lung fields.No respiratory distress. No wheezes. No rales.  Abdominal: Soft. Bowel sounds are normal. Exhibits no distension and no mass. There is no tenderness.  Musculoskeletal: Normal range of motion. Exhibits no edema.  Lymphadenopathy:  No cervical adenopathy.  Neurological: Alert and oriented to person, place, and time. Exhibits normal muscle tone. Examined in the wheelchair.  Skin: Skin is warm and dry. No rash noted. Not diaphoretic. No erythema. No pallor.  Psychiatric: Mood, memory and judgment normal.  Vitals reviewed.  LABORATORY DATA: Lab Results  Component Value Date   WBC 3.9 (L) 05/30/2020   HGB 8.4 (L) 05/30/2020   HCT 27.3 (L) 05/30/2020   MCV 101.1 (H) 05/30/2020   PLT 95 (L) 05/30/2020      Chemistry      Component Value Date/Time   NA 141  05/30/2020 0954   NA 143 03/17/2020 0915   K 4.4 05/30/2020 0954   CL 112 (H) 05/30/2020 0954   CO2 24 05/30/2020 0954   BUN 19 05/30/2020 0954   BUN 6 (L) 03/17/2020 0915   CREATININE 0.73 05/30/2020 0954      Component Value Date/Time   CALCIUM 9.7 05/30/2020 0954   ALKPHOS 55 05/30/2020 0954   AST 11 (L) 05/30/2020 0954   ALT 9 05/30/2020 0954   BILITOT 0.4 05/30/2020 0954       RADIOGRAPHIC STUDIES:  CUP PACEART REMOTE DEVICE CHECK  Result Date: 05/11/2020 Scheduled remote reviewed. Normal device function.  AF burden 48%; rates controlled; known, on Xarelto Next remote 91 days.    AM[place]    ASSESSMENT/PLAN:  This is a very pleasant 85 year old african [place] female with extensive stage small cell lung cancer. She presented with a central right upper  lobe lung mass in addition to left lower lobe nodule and mediastinal lymphadenopathy and a metastatic bone lesion in the left iliac wing. She was diagnosed in April 2021.   The patient is currently undergoing treatment with palliative systemic chemotherapy with carboplatin for an AUC of 5 on day 1, etoposide 100 mg/m2 on days 1, 2, and 3 and Imfinzi 1500 mg every 3 weeks. Starting from cycle #5, the patient has been on maintenance immunotherapy with Imfinzi. She is status post 5 cycles.   Labs were reviewed. Recommend that she proceed with cycle #6 today as scheduled.   I will arrange for her restaging CT scan of her chest, abdomen, and pelvis prior to her next cycle of treatment.   We will see her back for a follow up visit in 4 weeks for evaluation and to review her scan before starting cycle #7.  Discussed I would rather have her take tylenol for her intermittent positional abdominal sharp pain which is chronic possibly related to adhesion from prior surgeries and cramping due to her IBS. I would not recommend taking oxycodone for that. I also discussed we will re-evaluate her abdomen/pelvis on her upcoming scan;  however, to please seek medical attention sooner if she has worsening constant abdominal pain, nausea/vomiting, fevers, bloody stools, etc.   The patient was advised to call immediately if she has any concerning symptoms in the interval. The patient voices understanding of current disease status and treatment options and is in agreement with the current care plan. All questions were answered. The patient knows to call the clinic with any problems, questions or concerns. We can certainly see the patient much sooner if necessary  Orders Placed This Encounter  Procedures  . CT CHEST ABDOMEN PELVIS W CONTRAST    Standing Status:   Future    Standing Expiration Date:   05/30/2021    Order Specific Question:   Reason for Exam (SYMPTOM  OR DIAGNOSIS REQUIRED)    Answer:   Restaging Lung Cancer    Order Specific Question:   Preferred imaging location?    Answer:   [facility]    Order Specific Question:   Radiology Contrast Protocol - do NOT remove file path    Answer:   [email or web address]     [place], PA-C 05/30/20

## 2020-05-26 NOTE — Progress Notes (Signed)
: [name], [name], MD Referring Physician: Dr. Curt Bears EP: Dr. Curt Bears Oncology: Dr. Langston [name] is a 85 y.o. female with a h/o  CAD, tachybrady syndrome s/p PPM, and afib on Multaq. and xarelto. She was recently found to have persistent afib 4/29. She  heard that  she  had new dx of  extensive stage (T2 a, N2, M1 C) small cell lung cancer  with central right upper lobe lung mass in addition to left lower lobe pulmonary metastasis and bilateral hilar, subcarinal and bilateral paratracheal and left prevascular lymphadenopathy in addition to bone metastasis in the left iliac wing, the day before afib started. Dr.Camnitz stopped Multaq and referred to the afib clinic.  She had smoked x 66 years but stopped smoking on 01/28/20. She was offered palliative care  and hospice referral vrs consideration of palliative systemic chemotherapy. Pt wishes the palliative systemic chemotherapy. She is to start the first cycle May 10. She is rate controlled today and does not feel the afib. Has felt tired for many weeks. She reports reduced appetite and weight loss for months now. She is rate controlled.  F/u in the afib clinic, 05/26/20. Pt is here as increased afib burden was seen on device with last Paceart and pt was referred to afib clinic to discuss. Pt is still fighting extensive stage small cell lung cancer presented with central right upper lobe lung mass in addition to left lower lobe lung nodule and mediastinal lymphadenopathy and metastatic bone lesion in the left iliac wing diagnosed in April 2021. She is unaware of the afib, it is rate controlled.She said that afib is the least of her worries as she has a lot of  things that affect her with her cancer. She  has lost another 8 lbs and has a lot of pain with eating. She is on maintenance chemo now. She  would likely need Tikosyn and she is not interested in starting any new meds as well as she has chronic diarrhea which would make electrolyte maintenance an  issue. Pt was on Multaq at one point but this has been stopped at some point.   Today, she denies symptoms of palpitations, chest pain, shortness of breath, orthopnea, PND, lower extremity edema, dizziness, presyncope, syncope, or neurologic sequela. The patient is tolerating medications without difficulties and is otherwise without complaint today.   Past Medical History:  Diagnosis Date  . CAD (coronary artery disease), native coronary artery    PTCA of distal right coronary artery 1997 Cypher stents to circumflex 2005 Cardiac cath in 2011 with patent stent to circumflex and moderate disease elsewhere treated medically   . Cancer ([place])   . Cardiac pacemaker in situ 12/24/2015   Original implant reportedly in 1991 for tachybradycardia syndrome, generator change in 1999, 2004 and evidently again in 2016 in [place]   . COPD (chronic obstructive pulmonary disease) ([place])   . Diabetes mellitus without complication ([place])   . Hypertension   . Hypothyroidism 03/23/2010  . Pacemaker   . Paroxysmal atrial fibrillation ([place]) 03/23/2010   CHA2DS2VASC score 5    Past Surgical History:  Procedure Laterality Date  . BIOPSY  01/08/2020   Procedure: BIOPSY;  Surgeon: Garner Nash, DO;  Location: [facility];  Service: Pulmonary;;  . BRONCHIAL BRUSHINGS  01/08/2020   Procedure: BRONCHIAL BRUSHINGS;  Surgeon: Garner Nash, DO;  Location: [facility];  Service: Pulmonary;;  . BRONCHIAL WASHINGS  01/08/2020   Procedure: BRONCHIAL WASHINGS;  Surgeon: Garner Nash,  DO;  Location: [facility];  Service: Pulmonary;;  . CARDIAC CATHETERIZATION N/A 12/26/2015   Procedure: Left Heart Cath and Coronary Angiography;  Surgeon: Peter M [name], MD;  Location: [facility];  Service: Cardiovascular;  Laterality: N/A;  . CARDIOVERSION  05/2015  . CHOLECYSTECTOMY  2012  . CORONARY ANGIOPLASTY WITH STENT PLACEMENT  1990  . ENDOBRONCHIAL ULTRASOUND  01/08/2020   Procedure: ENDOBRONCHIAL ULTRASOUND;  Surgeon:  Garner Nash, DO;  Location: [facility];  Service: Pulmonary;;  . FINE NEEDLE ASPIRATION BIOPSY  01/08/2020   Procedure: FINE NEEDLE ASPIRATION BIOPSY;  Surgeon: Garner Nash, DO;  Location: [facility];  Service: Pulmonary;;  . HERNIA REPAIR    . IR IMAGING GUIDED PORT INSERTION  02/18/2020  . PACEMAKER GENERATOR CHANGE  [id number], 2016  . PACEMAKER INSERTION  1991  . VIDEO BRONCHOSCOPY WITH ENDOBRONCHIAL ULTRASOUND N/A 01/08/2020   Procedure: VIDEO BRONCHOSCOPY;  Surgeon: Garner Nash, DO;  Location: [facility];  Service: Pulmonary;  Laterality: N/A;    Current Outpatient Medications  Medication Sig Dispense Refill  . acetaminophen (TYLENOL) 500 MG tablet Take 500 mg by mouth every 6 (six) hours as needed for headache.    . albuterol (VENTOLIN HFA) 108 (90 Base) MCG/ACT inhaler TAKE 2 PUFFS BY MOUTH EVERY 6 HOURS AS NEEDED FOR WHEEZE OR SHORTNESS OF BREATH 1 each 1  . apixaban (ELIQUIS) 2.5 MG TABS tablet Take 1 tablet (2.5 mg total) by mouth 2 (two) times daily. 60 tablet 5  . atorvastatin (LIPITOR) 10 MG tablet TAKE 1 TABLET BY MOUTH EVERY DAY 90 tablet 3  . carvedilol (COREG) 12.5 MG tablet TAKE 1 TABLET (12.5 MG TOTAL) BY MOUTH 2 (TWO) TIMES DAILY WITH A MEAL. 180 tablet 2  . diclofenac sodium (VOLTAREN) 1 % GEL Apply 4 g topically 4 (four) times daily. (Patient taking differently: Apply 4 g topically 4 (four) times daily as needed (pain). ) 4 Tube 3  . doxepin (SINEQUAN) 10 MG/ML solution Take 0.3 mLs (3 mg total) by mouth at bedtime. 30 mL 1  . fenofibrate (TRICOR) 48 MG tablet TAKE 1 TABLET BY MOUTH EVERY DAY (Patient taking differently: Take 48 mg by mouth daily after lunch. ) 90 tablet 3  . glucose blood (ONETOUCH VERIO) test strip Use to test 3-4 times daily. 300 strip 4  . levothyroxine (SYNTHROID) 112 MCG tablet TAKE 1 TABLET (112 MCG TOTAL) BY MOUTH DAILY BEFORE BREAKFAST. 90 tablet 2  . lidocaine-prilocaine (EMLA) cream Apply 1 application topically as needed. 30 g  2  . linagliptin (TRADJENTA) 5 MG TABS tablet TAKE 1 TABLET (5 MG TOTAL) DAILY BY MOUTH. (Patient taking differently: Take 5 mg by mouth daily after breakfast. ) 90 tablet 1  . Loperamide HCl (IMODIUM PO) Take by mouth as needed.    Marland Kitchen losartan (COZAAR) 50 MG tablet Take 2 tablets by mouth daily. 180 tablet 1  . nitroGLYCERIN (NITROSTAT) 0.4 MG SL tablet Place 1 tablet (0.4 mg total) under the tongue every 5 (five) minutes x 3 doses as needed for chest pain (Max 3 doses within 15 min. Call 911). 10 tablet 0  . oxyCODONE-acetaminophen (PERCOCET/ROXICET) 5-325 MG tablet Take 1 tablet by mouth every 8 (eight) hours as needed for severe pain. (Patient taking differently: Take 1 tablet by mouth as needed for severe pain. ) 20 tablet 0  . pantoprazole (PROTONIX) 40 MG tablet TAKE 1 TABLET BY MOUTH EVERY DAY 90 tablet 1  . potassium chloride SA (KLOR-CON) 20 MEQ tablet  Take 1 tablet (20 mEq total) by mouth daily. 7 tablet 0  . prochlorperazine (COMPAZINE) 10 MG tablet Take 1 tablet (10 mg total) by mouth every 6 (six) hours as needed for nausea or vomiting. 30 tablet 1   No current facility-administered medications for this encounter.    No Known Allergies  Social History   Socioeconomic History  . Marital status: Widowed    Spouse name: Not on file  . Number of children: Not on file  . Years of education: Not on file  . Highest education level: Not on file  Occupational History  . Occupation: retired  Tobacco Use  . Smoking status: Former Smoker    Packs/day: 0.75    Years: 66.00    Pack years: 49.50    Quit date: 12/27/2019    Years since quitting: 0.4  . Smokeless tobacco: Never Used  Vaping Use  . Vaping Use: Never used  Substance and Sexual Activity  . Alcohol use: No    Alcohol/week: 0.0 standard drinks  . Drug use: No  . Sexual activity: Not on file  Other Topics Concern  . Not on file  Social History Narrative  . Not on file   Social Determinants of Health   Financial  Resource Strain:   . Difficulty of Paying Living Expenses: Not on file  Food Insecurity:   . Worried About Charity fundraiser in the Last Year: Not on file  . Ran Out of Food in the Last Year: Not on file  Transportation Needs:   . Lack of Transportation (Medical): Not on file  . Lack of Transportation (Non-Medical): Not on file  Physical Activity:   . Days of Exercise per Week: Not on file  . Minutes of Exercise per Session: Not on file  Stress:   . Feeling of Stress : Not on file  Social Connections:   . Frequency of Communication with Friends and Family: Not on file  . Frequency of Social Gatherings with Friends and Family: Not on file  . Attends Religious Services: Not on file  . Active Member of Clubs or Organizations: Not on file  . Attends Archivist Meetings: Not on file  . Marital Status: Not on file  Intimate Partner Violence:   . Fear of Current or Ex-Partner: Not on file  . Emotionally Abused: Not on file  . Physically Abused: Not on file  . Sexually Abused: Not on file    Family History  Problem Relation Age of Onset  . Heart disease Father   . Heart disease Mother   . Heart attack Mother   . Cirrhosis Brother   . Colon cancer Neg Hx   . Stomach cancer Neg Hx     ROS- All systems are reviewed and negative except as per the HPI above  Physical Exam: Vitals:   05/26/20 1146  BP: (!) 118/50  Pulse: 65  Weight: 50 kg  Height: 5\' 2"  (1.575 m)   Wt Readings from Last 3 Encounters:  05/26/20 50 kg  05/02/20 52.7 kg  04/27/20 53.1 kg    Labs: Lab Results  Component Value Date   NA 144 05/02/2020   K 3.1 (L) 05/02/2020   CL 113 (H) 05/02/2020   CO2 24 05/02/2020   GLUCOSE 84 05/02/2020   BUN 12 05/02/2020   CREATININE 0.61 05/02/2020   CALCIUM 9.2 05/02/2020   MG 2.0 04/27/2016   Lab Results  Component Value Date   INR 1.0 02/18/2020  Lab Results  Component Value Date   CHOL 156 06/10/2019   HDL 64.30 06/10/2019   LDLCALC 76  06/10/2019   TRIG 79.0 06/10/2019     GEN- The patient is well appearing, alert and oriented x 3 today.   Head- normocephalic, atraumatic Eyes-  Sclera clear, conjunctiva pink Ears- hearing intact Oropharynx- clear Neck- supple, no JVP Lymph- no cervical lymphadenopathy Lungs- Clear to ausculation bilaterally, normal work of breathing Heart- irregular rate and rhythm, no murmurs, rubs or gallops, PMI not laterally displaced GI- soft, NT, ND, + BS Extremities- no clubbing, cyanosis, or edema MS- no significant deformity or atrophy Skin- no rash or lesion Psych- euthymic mood, full affect Neuro- strength and sensation are intact  EKG-afib at 65 bpm, qrs int 86 ms, qtc 407 ms    Assessment and Plan: 1. Atrial  fibrillation  New onset in the setting of hearing the stressful news of new dx of cancer 01/2020 She  has opted for palliative chemotherapy, which she has finished and now with maintenance chemo  She is rate controlled afib and was unaware until device clinic  brought it to her attention Was on multaq but  stopped by Dr. Curt Bears several months ago  I feel efforts to restore SR will be difficult  with concurrent CA therapy and chronic diarrhea which would  impact Tikosyn, which would be her best antiarrythmic option As well as pt  is asymptomatic with afib and does not wish any additional therapy/drugs to treat  I think her best option  is rate control strategy  Continue carvedilol 12.5 mg bid   2. CHA2DS2VASc score of 6  Continue eliquis 2.5 mg bid   Remote checks per scheduled and f/u with Dr. Curt Bears per recall     Geroge Baseman. Shaquandra Galano, [facility] [address] [place], [place] [address] [phone]

## 2020-05-30 ENCOUNTER — Ambulatory Visit: Payer: Medicare Other

## 2020-05-30 ENCOUNTER — Inpatient Hospital Stay: Payer: Medicare Other

## 2020-05-30 ENCOUNTER — Inpatient Hospital Stay (HOSPITAL_BASED_OUTPATIENT_CLINIC_OR_DEPARTMENT_OTHER): Payer: Medicare Other | Admitting: Physician Assistant

## 2020-05-30 ENCOUNTER — Other Ambulatory Visit: Payer: Self-pay

## 2020-05-30 VITALS — BP 101/45 | HR 65 | Temp 98.6°F | Resp 17 | Ht 62.0 in | Wt 111.6 lb

## 2020-05-30 DIAGNOSIS — C7951 Secondary malignant neoplasm of bone: Secondary | ICD-10-CM | POA: Diagnosis not present

## 2020-05-30 DIAGNOSIS — E039 Hypothyroidism, unspecified: Secondary | ICD-10-CM | POA: Diagnosis not present

## 2020-05-30 DIAGNOSIS — Z79899 Other long term (current) drug therapy: Secondary | ICD-10-CM | POA: Diagnosis not present

## 2020-05-30 DIAGNOSIS — C3411 Malignant neoplasm of upper lobe, right bronchus or lung: Secondary | ICD-10-CM | POA: Diagnosis not present

## 2020-05-30 DIAGNOSIS — Z5112 Encounter for antineoplastic immunotherapy: Secondary | ICD-10-CM | POA: Diagnosis not present

## 2020-05-30 DIAGNOSIS — C3491 Malignant neoplasm of unspecified part of right bronchus or lung: Secondary | ICD-10-CM

## 2020-05-30 DIAGNOSIS — Z95828 Presence of other vascular implants and grafts: Secondary | ICD-10-CM

## 2020-05-30 DIAGNOSIS — I10 Essential (primary) hypertension: Secondary | ICD-10-CM | POA: Diagnosis not present

## 2020-05-30 DIAGNOSIS — J449 Chronic obstructive pulmonary disease, unspecified: Secondary | ICD-10-CM | POA: Diagnosis not present

## 2020-05-30 DIAGNOSIS — I480 Paroxysmal atrial fibrillation: Secondary | ICD-10-CM | POA: Diagnosis not present

## 2020-05-30 DIAGNOSIS — E119 Type 2 diabetes mellitus without complications: Secondary | ICD-10-CM | POA: Diagnosis not present

## 2020-05-30 LAB — CMP (CANCER CENTER ONLY)
ALT: 9 U/L (ref 0–44)
AST: 11 U/L — ABNORMAL LOW (ref 15–41)
Albumin: 3.4 g/dL — ABNORMAL LOW (ref 3.5–5.0)
Alkaline Phosphatase: 55 U/L (ref 38–126)
Anion gap: 5 (ref 5–15)
BUN: 19 mg/dL (ref 8–23)
CO2: 24 mmol/L (ref 22–32)
Calcium: 9.7 mg/dL (ref 8.9–10.3)
Chloride: 112 mmol/L — ABNORMAL HIGH (ref 98–111)
Creatinine: 0.73 mg/dL (ref 0.44–1.00)
GFR, Est AFR Am: >60 mL/min (ref >60)
GFR, Estimated: >60 mL/min (ref >60)
Glucose, Bld: 83 mg/dL (ref 70–99)
Potassium: 4.4 mmol/L (ref 3.5–5.1)
Sodium: 141 mmol/L (ref 135–145)
Total Bilirubin: 0.4 mg/dL (ref 0.3–1.2)
Total Protein: 6.1 g/dL — ABNORMAL LOW (ref 6.5–8.1)

## 2020-05-30 LAB — CBC WITH DIFFERENTIAL (CANCER CENTER ONLY)
Abs Immature Granulocytes: 0.01 10*3/uL (ref 0.00–0.07)
Basophils Absolute: 0 10*3/uL (ref 0.0–0.1)
Basophils Relative: 1 %
Eosinophils Absolute: 0.1 10*3/uL (ref 0.0–0.5)
Eosinophils Relative: 4 %
HCT: 27.3 % — ABNORMAL LOW (ref 36.0–46.0)
Hemoglobin: 8.4 g/dL — ABNORMAL LOW (ref 12.0–15.0)
Immature Granulocytes: 0 %
Lymphocytes Relative: 35 %
Lymphs Abs: 1.4 10*3/uL (ref 0.7–4.0)
MCH: 31.1 pg (ref 26.0–34.0)
MCHC: 30.8 g/dL (ref 30.0–36.0)
MCV: 101.1 fL — ABNORMAL HIGH (ref 80.0–100.0)
Monocytes Absolute: 0.5 10*3/uL (ref 0.1–1.0)
Monocytes Relative: 14 %
Neutro Abs: 1.9 10*3/uL (ref 1.7–7.7)
Neutrophils Relative %: 46 %
Platelet Count: 95 10*3/uL — ABNORMAL LOW (ref 150–400)
RBC: 2.7 MIL/uL — ABNORMAL LOW (ref 3.87–5.11)
RDW: 14.1 % (ref 11.5–15.5)
WBC Count: 3.9 10*3/uL — ABNORMAL LOW (ref 4.0–10.5)
nRBC: 0 % (ref 0.0–0.2)

## 2020-05-30 LAB — TSH: TSH: 0.486 u[IU]/mL (ref 0.308–3.960)

## 2020-05-30 MED ORDER — SODIUM CHLORIDE 0.9% FLUSH
10.0000 mL | INTRAVENOUS | Status: DC | PRN
  Administered 2020-05-30: 10 mL
  Filled 2020-05-30: qty 10

## 2020-05-30 MED ORDER — SODIUM CHLORIDE 0.9 % IV SOLN
Freq: Once | INTRAVENOUS | Status: AC
  Filled 2020-05-30: qty 250

## 2020-05-30 MED ORDER — SODIUM CHLORIDE 0.9% FLUSH
10.0000 mL | Freq: Once | INTRAVENOUS | Status: AC
  Administered 2020-05-30: 10 mL
  Filled 2020-05-30: qty 10

## 2020-05-30 MED ORDER — HEPARIN SOD (PORK) LOCK FLUSH 100 UNIT/ML IV SOLN
500.0000 [IU] | Freq: Once | INTRAVENOUS | Status: AC | PRN
  Administered 2020-05-30: 500 [IU]
  Filled 2020-05-30: qty 5

## 2020-05-30 MED ORDER — SODIUM CHLORIDE 0.9 % IV SOLN
1500.0000 mg | Freq: Once | INTRAVENOUS | Status: AC
  Administered 2020-05-30: 1500 mg via INTRAVENOUS
  Filled 2020-05-30: qty 30

## 2020-05-30 NOTE — Patient Instructions (Signed)
[facility] Discharge Instructions for Patients Receiving Chemotherapy  Today you received the following chemotherapy agents: durvalumab.  To help prevent nausea and vomiting after your treatment, we encourage you to take your nausea medication as directed.   If you develop nausea and vomiting that is not controlled by your nausea medication, call the clinic.   BELOW ARE SYMPTOMS THAT SHOULD BE REPORTED IMMEDIATELY:  *FEVER GREATER THAN 100.5 F  *CHILLS WITH OR WITHOUT FEVER  NAUSEA AND VOMITING THAT IS NOT CONTROLLED WITH YOUR NAUSEA MEDICATION  *UNUSUAL SHORTNESS OF BREATH  *UNUSUAL BRUISING OR BLEEDING  TENDERNESS IN MOUTH AND THROAT WITH OR WITHOUT PRESENCE OF ULCERS  *URINARY PROBLEMS  *BOWEL PROBLEMS  UNUSUAL RASH Items with * indicate a potential emergency and should be followed up as soon as possible.  Feel free to call the clinic should you have any questions or concerns. The clinic phone number is [phone].  Please show the CHEMO ALERT CARD at check-in to the Emergency Department and triage nurse.   

## 2020-05-30 NOTE — Progress Notes (Signed)
Per Cassie Heilingoetter PA-C, ok to treat with platelets 95.

## 2020-06-03 ENCOUNTER — Telehealth: Payer: Self-pay | Admitting: *Deleted

## 2020-06-03 NOTE — Telephone Encounter (Addendum)
Received call from daughter, [name] stating that her sister, pt's daughter is in hospital & has taken a turn for worse.  She was originally in for Covid but it has been past 21 days & hospital MD is allowing the daughter to visit her mother & is asking if OK for Deliliah to visit.  Our Pt is on immunotherapy & counts are a little low & from our perspective would suggest caution.  Discussed infection prevention & informed that this would be up to hospitalist also. Expressed sympathy & [name] expressed appreciation. Routed to Cassie to check to see if she agrees.  Cassie certainly had reservation with pt's diagnosis.  Called & spoke with [name] again & said she was just checking in case Talena wanted or asked to visit.  She was told by Hospitalist that only one person could visit & so that solves this issue.

## 2020-06-22 ENCOUNTER — Ambulatory Visit (HOSPITAL_COMMUNITY)
Admission: RE | Admit: 2020-06-22 | Discharge: 2020-06-22 | Disposition: A | Discharge status: Home / Self Care | Payer: Medicare Other | Source: Ambulatory Visit | Attending: Physician Assistant | Admitting: Physician Assistant

## 2020-06-22 ENCOUNTER — Encounter (HOSPITAL_COMMUNITY): Payer: Self-pay

## 2020-06-22 ENCOUNTER — Other Ambulatory Visit: Payer: Self-pay

## 2020-06-22 DIAGNOSIS — C3491 Malignant neoplasm of unspecified part of right bronchus or lung: Secondary | ICD-10-CM | POA: Diagnosis not present

## 2020-06-22 DIAGNOSIS — I700 Atherosclerosis of aorta: Secondary | ICD-10-CM | POA: Diagnosis not present

## 2020-06-22 DIAGNOSIS — J439 Emphysema, unspecified: Secondary | ICD-10-CM | POA: Diagnosis not present

## 2020-06-22 DIAGNOSIS — C3490 Malignant neoplasm of unspecified part of unspecified bronchus or lung: Secondary | ICD-10-CM | POA: Diagnosis not present

## 2020-06-22 DIAGNOSIS — I2510 Atherosclerotic heart disease of native coronary artery without angina pectoris: Secondary | ICD-10-CM | POA: Diagnosis not present

## 2020-06-22 DIAGNOSIS — N200 Calculus of kidney: Secondary | ICD-10-CM | POA: Diagnosis not present

## 2020-06-22 MED ORDER — IOHEXOL 300 MG/ML  SOLN: 100.0000 mL | Freq: Once | INTRAMUSCULAR | Status: DC | PRN

## 2020-06-22 MED ORDER — IOHEXOL 9 MG/ML PO SOLN: 500.0000 mL | ORAL | Status: DC

## 2020-06-22 MED ORDER — IOHEXOL 300 MG/ML  SOLN: 75.0000 mL | Freq: Once | INTRAMUSCULAR | Status: DC | PRN

## 2020-06-22 MED ORDER — IOHEXOL 300 MG/ML  SOLN
75.0000 mL | Freq: Once | INTRAMUSCULAR | Status: AC | PRN
  Administered 2020-06-22: 75 mL via INTRAVENOUS

## 2020-06-22 MED ORDER — HEPARIN SOD (PORK) LOCK FLUSH 100 UNIT/ML IV SOLN: 500.0000 [IU] | Freq: Once | INTRAVENOUS | Status: DC

## 2020-06-27 ENCOUNTER — Inpatient Hospital Stay: Payer: Medicare Other

## 2020-06-27 ENCOUNTER — Inpatient Hospital Stay: Payer: Medicare Other | Attending: Internal Medicine | Admitting: Internal Medicine

## 2020-06-27 ENCOUNTER — Encounter: Payer: Self-pay | Admitting: Internal Medicine

## 2020-06-27 ENCOUNTER — Inpatient Hospital Stay: Payer: Medicare Other | Admitting: Nutrition

## 2020-06-27 ENCOUNTER — Other Ambulatory Visit: Payer: Self-pay

## 2020-06-27 VITALS — BP 151/49 | HR 63 | Temp 97.9°F | Resp 16 | Ht 62.0 in | Wt 113.6 lb

## 2020-06-27 DIAGNOSIS — Z5112 Encounter for antineoplastic immunotherapy: Secondary | ICD-10-CM | POA: Diagnosis not present

## 2020-06-27 DIAGNOSIS — C3491 Malignant neoplasm of unspecified part of right bronchus or lung: Secondary | ICD-10-CM

## 2020-06-27 DIAGNOSIS — C7951 Secondary malignant neoplasm of bone: Secondary | ICD-10-CM | POA: Insufficient documentation

## 2020-06-27 DIAGNOSIS — E039 Hypothyroidism, unspecified: Secondary | ICD-10-CM | POA: Insufficient documentation

## 2020-06-27 DIAGNOSIS — D649 Anemia, unspecified: Secondary | ICD-10-CM | POA: Diagnosis not present

## 2020-06-27 DIAGNOSIS — Z95828 Presence of other vascular implants and grafts: Secondary | ICD-10-CM

## 2020-06-27 DIAGNOSIS — C3411 Malignant neoplasm of upper lobe, right bronchus or lung: Secondary | ICD-10-CM | POA: Diagnosis not present

## 2020-06-27 LAB — CBC WITH DIFFERENTIAL (CANCER CENTER ONLY)
Abs Immature Granulocytes: 0 10*3/uL (ref 0.00–0.07)
Basophils Absolute: 0 10*3/uL (ref 0.0–0.1)
Basophils Relative: 1 %
Eosinophils Absolute: 0.1 10*3/uL (ref 0.0–0.5)
Eosinophils Relative: 2 %
HCT: 30.5 % — ABNORMAL LOW (ref 36.0–46.0)
Hemoglobin: 9.5 g/dL — ABNORMAL LOW (ref 12.0–15.0)
Immature Granulocytes: 0 %
Lymphocytes Relative: 35 %
Lymphs Abs: 1.2 10*3/uL (ref 0.7–4.0)
MCH: 30.6 pg (ref 26.0–34.0)
MCHC: 31.1 g/dL (ref 30.0–36.0)
MCV: 98.4 fL (ref 80.0–100.0)
Monocytes Absolute: 0.4 10*3/uL (ref 0.1–1.0)
Monocytes Relative: 10 %
Neutro Abs: 1.8 10*3/uL (ref 1.7–7.7)
Neutrophils Relative %: 52 %
Platelet Count: 121 10*3/uL — ABNORMAL LOW (ref 150–400)
RBC: 3.1 MIL/uL — ABNORMAL LOW (ref 3.87–5.11)
RDW: 12.6 % (ref 11.5–15.5)
WBC Count: 3.5 10*3/uL — ABNORMAL LOW (ref 4.0–10.5)
nRBC: 0 % (ref 0.0–0.2)

## 2020-06-27 LAB — CMP (CANCER CENTER ONLY)
ALT: 13 U/L (ref 0–44)
AST: 12 U/L — ABNORMAL LOW (ref 15–41)
Albumin: 3.3 g/dL — ABNORMAL LOW (ref 3.5–5.0)
Alkaline Phosphatase: 48 U/L (ref 38–126)
Anion gap: 3 — ABNORMAL LOW (ref 5–15)
BUN: 11 mg/dL (ref 8–23)
CO2: 26 mmol/L (ref 22–32)
Calcium: 9 mg/dL (ref 8.9–10.3)
Chloride: 111 mmol/L (ref 98–111)
Creatinine: 0.7 mg/dL (ref 0.44–1.00)
GFR, Est AFR Am: >60 mL/min (ref >60)
GFR, Estimated: >60 mL/min (ref >60)
Glucose, Bld: 69 mg/dL — ABNORMAL LOW (ref 70–99)
Potassium: 3.8 mmol/L (ref 3.5–5.1)
Sodium: 140 mmol/L (ref 135–145)
Total Bilirubin: 0.3 mg/dL (ref 0.3–1.2)
Total Protein: 6.2 g/dL — ABNORMAL LOW (ref 6.5–8.1)

## 2020-06-27 LAB — TSH: TSH: 1.536 u[IU]/mL (ref 0.308–3.960)

## 2020-06-27 MED ORDER — SODIUM CHLORIDE 0.9% FLUSH
10.0000 mL | Freq: Once | INTRAVENOUS | Status: DC
  Filled 2020-06-27: qty 10

## 2020-06-27 MED ORDER — HEPARIN SOD (PORK) LOCK FLUSH 100 UNIT/ML IV SOLN
500.0000 [IU] | Freq: Once | INTRAVENOUS | Status: AC | PRN
  Administered 2020-06-27: 500 [IU]
  Filled 2020-06-27: qty 5

## 2020-06-27 MED ORDER — SODIUM CHLORIDE 0.9% FLUSH
10.0000 mL | INTRAVENOUS | Status: DC | PRN
  Administered 2020-06-27: 10 mL
  Filled 2020-06-27: qty 10

## 2020-06-27 MED ORDER — SODIUM CHLORIDE 0.9 % IV SOLN
1500.0000 mg | Freq: Once | INTRAVENOUS | Status: AC
  Administered 2020-06-27: 1500 mg via INTRAVENOUS
  Filled 2020-06-27: qty 30

## 2020-06-27 MED ORDER — SODIUM CHLORIDE 0.9 % IV SOLN
Freq: Once | INTRAVENOUS | Status: AC
  Filled 2020-06-27: qty 250

## 2020-06-27 NOTE — Progress Notes (Signed)
[name]:([phone]) [phone]   Fax:([phone]) [phone]  OFFICE PROGRESS NOTE  [name], Robin G, MD [address] [place] [address]  DIAGNOSIS: Extensive stage (T2 a, N2, M1 C) small cell lung cancer presented with central right upper lobe lung mass in addition to left lower lobe pulmonary metastasis and bilateral hilar, subcarinal and bilateral paratracheal and left prevascular lymphadenopathy in addition to bone metastasis in the left iliac wing diagnosed in April 2021.  PRIOR THERAPY: None  CURRENT THERAPY: Systemic chemotherapy with carboplatin for AUC of 5 on day 1, etoposide 100 mg/M2 on days 1, 2 and 3 with Neulasta support in addition to Imfinzi 1500 mg IV every 3 weeks during chemotherapy followed by maintenance Imfinzi 1500 mg IV every 4 weeks after cycle #4.  Status post 6 cycles.  INTERVAL HISTORY: [name] 85 y.o. female returns to the clinic today for follow-up visit accompanied by her daughter.  The patient is feeling fine today with no concerning complaints.  She denied having any chest pain but has shortness of breath with exertion with no cough or hemoptysis.  She denied having any recent weight loss or night sweats.  She has no nausea, vomiting, diarrhea or constipation but has occasional epigastric pain.  She is currently on Protonix for acid reflux.  The patient has no headache or visual changes.  She continues to tolerate her treatment with Imfinzi fairly well.  She had repeat CT scan of the chest performed recently and she is here for evaluation and discussion of her scan results.  MEDICAL HISTORY: Past Medical History:  Diagnosis Date  . CAD (coronary artery disease), native coronary artery    PTCA of distal right coronary artery 1997 Cypher stents to circumflex 2005 Cardiac cath in 2011 with patent stent to circumflex and moderate disease elsewhere treated medically   . Cancer ([facility])   . Cardiac pacemaker in situ 12/24/2015    Original implant reportedly in 1991 for tachybradycardia syndrome, generator change in 1999, 2004 and evidently again in 2016 in [place]   . COPD (chronic obstructive pulmonary disease) ([facility])   . Diabetes mellitus without complication ([facility])   . Hypertension   . Hypothyroidism 03/23/2010  . Pacemaker   . Paroxysmal atrial fibrillation ([facility]) 03/23/2010   CHA2DS2VASC score 5     ALLERGIES:  has No Known Allergies.  MEDICATIONS:  Current Outpatient Medications  Medication Sig Dispense Refill  . acetaminophen (TYLENOL) 500 MG tablet Take 500 mg by mouth every 6 (six) hours as needed for headache.    . albuterol (VENTOLIN HFA) 108 (90 Base) MCG/ACT inhaler TAKE 2 PUFFS BY MOUTH EVERY 6 HOURS AS NEEDED FOR WHEEZE OR SHORTNESS OF BREATH 1 each 1  . apixaban (ELIQUIS) 2.5 MG TABS tablet Take 1 tablet (2.5 mg total) by mouth 2 (two) times daily. 60 tablet 5  . atorvastatin (LIPITOR) 10 MG tablet TAKE 1 TABLET BY MOUTH EVERY DAY 90 tablet 3  . carvedilol (COREG) 12.5 MG tablet TAKE 1 TABLET (12.5 MG TOTAL) BY MOUTH 2 (TWO) TIMES DAILY WITH A MEAL. 180 tablet 2  . diclofenac sodium (VOLTAREN) 1 % GEL Apply 4 [name] topically 4 (four) times daily. (Patient taking differently: Apply 4 [name] topically 4 (four) times daily as needed (pain). ) 4 Tube 3  . doxepin (SINEQUAN) 10 MG/ML solution Take 0.3 mLs (3 mg total) by mouth at bedtime. 30 mL 1  . fenofibrate (TRICOR) 48 MG tablet TAKE 1 TABLET BY  MOUTH EVERY DAY (Patient taking differently: Take 48 mg by mouth daily after lunch. ) 90 tablet 3  . glucose blood (ONETOUCH VERIO) test strip Use to test 3-4 times daily. 300 strip 4  . levothyroxine (SYNTHROID) 112 MCG tablet TAKE 1 TABLET (112 MCG TOTAL) BY MOUTH DAILY BEFORE BREAKFAST. 90 tablet 2  . lidocaine-prilocaine (EMLA) cream Apply 1 application topically as needed. 30 [name] 2  . linagliptin (TRADJENTA) 5 MG TABS tablet TAKE 1 TABLET (5 MG TOTAL) DAILY BY MOUTH. (Patient taking differently: Take 5 mg by mouth  daily after breakfast. ) 90 tablet 1  . Loperamide HCl (IMODIUM PO) Take by mouth as needed.    Marland Kitchen losartan (COZAAR) 50 MG tablet Take 2 tablets by mouth daily. 180 tablet 1  . nitroGLYCERIN (NITROSTAT) 0.4 MG SL tablet Place 1 tablet (0.4 mg total) under the tongue every 5 (five) minutes x 3 doses as needed for chest pain (Max 3 doses within 15 min. Call 911). 10 tablet 0  . oxyCODONE-acetaminophen (PERCOCET/ROXICET) 5-325 MG tablet Take 1 tablet by mouth every 8 (eight) hours as needed for severe pain. (Patient taking differently: Take 1 tablet by mouth as needed for severe pain. ) 20 tablet 0  . pantoprazole (PROTONIX) 40 MG tablet TAKE 1 TABLET BY MOUTH EVERY DAY 90 tablet 1  . potassium chloride SA (KLOR-CON) 20 MEQ tablet Take 1 tablet (20 mEq total) by mouth daily. 7 tablet 0  . prochlorperazine (COMPAZINE) 10 MG tablet Take 1 tablet (10 mg total) by mouth every 6 (six) hours as needed for nausea or vomiting. 30 tablet 1   No current facility-administered medications for this visit.    SURGICAL HISTORY:  Past Surgical History:  Procedure Laterality Date  . BIOPSY  01/08/2020   Procedure: BIOPSY;  Surgeon: Garner Nash, DO;  Location: [facility];  Service: Pulmonary;;  . BRONCHIAL BRUSHINGS  01/08/2020   Procedure: BRONCHIAL BRUSHINGS;  Surgeon: Garner Nash, DO;  Location: [facility];  Service: Pulmonary;;  . BRONCHIAL WASHINGS  01/08/2020   Procedure: BRONCHIAL WASHINGS;  Surgeon: Garner Nash, DO;  Location: [facility];  Service: Pulmonary;;  . CARDIAC CATHETERIZATION N/A 12/26/2015   Procedure: Left Heart Cath and Coronary Angiography;  Surgeon: Peter M [name], MD;  Location: [facility];  Service: Cardiovascular;  Laterality: N/A;  . CARDIOVERSION  05/2015  . CHOLECYSTECTOMY  2012  . CORONARY ANGIOPLASTY WITH STENT PLACEMENT  1990  . ENDOBRONCHIAL ULTRASOUND  01/08/2020   Procedure: ENDOBRONCHIAL ULTRASOUND;  Surgeon: Garner Nash, DO;  Location: [facility];   Service: Pulmonary;;  . FINE NEEDLE ASPIRATION BIOPSY  01/08/2020   Procedure: FINE NEEDLE ASPIRATION BIOPSY;  Surgeon: Garner Nash, DO;  Location: [facility];  Service: Pulmonary;;  . HERNIA REPAIR    . IR IMAGING GUIDED PORT INSERTION  02/18/2020  . PACEMAKER GENERATOR CHANGE  [id number], 2016  . PACEMAKER INSERTION  1991  . VIDEO BRONCHOSCOPY WITH ENDOBRONCHIAL ULTRASOUND N/A 01/08/2020   Procedure: VIDEO BRONCHOSCOPY;  Surgeon: Garner Nash, DO;  Location: [facility];  Service: Pulmonary;  Laterality: N/A;    REVIEW OF SYSTEMS:  Constitutional: positive for fatigue Eyes: negative Ears, nose, mouth, throat, and face: negative Respiratory: negative Cardiovascular: negative Gastrointestinal: positive for abdominal pain Genitourinary:negative Integument/breast: negative Hematologic/lymphatic: negative Musculoskeletal:positive for muscle weakness Neurological: negative Behavioral/Psych: negative Endocrine: negative Allergic/Immunologic: negative   PHYSICAL EXAMINATION: General appearance: alert, cooperative, appears stated age, fatigued and no distress Head: Normocephalic, without obvious abnormality, atraumatic Neck:  no adenopathy, no JVD, supple, symmetrical, trachea midline and thyroid not enlarged, symmetric, no tenderness/mass/nodules Lymph nodes: Cervical, supraclavicular, and axillary nodes normal. Resp: clear to auscultation bilaterally Back: symmetric, no curvature. ROM normal. No CVA tenderness. Cardio: regular rate and rhythm, S1, S2 normal, no murmur, click, rub or gallop GI: soft, non-tender; bowel sounds normal; no masses,  no organomegaly Extremities: extremities normal, atraumatic, no cyanosis or edema Neurologic: Alert and oriented X 3, normal strength and tone. Normal symmetric reflexes. Normal coordination and gait  ECOG PERFORMANCE STATUS: 1 - Symptomatic but completely ambulatory  Blood pressure (!) 151/49, pulse 63, temperature 97.9 F (36.6 C),  temperature source Tympanic, resp. rate 16, height 5\' 2"  (1.575 m), weight 113 lb 9.6 oz (51.5 kg), SpO2 100 %.  LABORATORY DATA: Lab Results  Component Value Date   WBC 3.5 (L) 06/27/2020   HGB 9.5 (L) 06/27/2020   HCT 30.5 (L) 06/27/2020   MCV 98.4 06/27/2020   PLT 121 (L) 06/27/2020      Chemistry      Component Value Date/Time   NA 140 06/27/2020 0924   NA 143 03/17/2020 0915   K 3.8 06/27/2020 0924   CL 111 06/27/2020 0924   CO2 26 06/27/2020 0924   BUN 11 06/27/2020 0924   BUN 6 (L) 03/17/2020 0915   CREATININE 0.70 06/27/2020 0924      Component Value Date/Time   CALCIUM 9.0 06/27/2020 0924   ALKPHOS 48 06/27/2020 0924   AST 12 (L) 06/27/2020 0924   ALT 13 06/27/2020 0924   BILITOT 0.3 06/27/2020 0924       RADIOGRAPHIC STUDIES: CT CHEST ABDOMEN PELVIS W CONTRAST  Result Date: 06/22/2020 CLINICAL DATA:  Lung cancer restaging. EXAM: CT CHEST, ABDOMEN, AND PELVIS WITH CONTRAST TECHNIQUE: Multidetector CT imaging of the chest, abdomen and pelvis was performed following the standard protocol during bolus administration of intravenous contrast. CONTRAST:  22mL OMNIPAQUE IOHEXOL 300 MG/ML  SOLN COMPARISON:  04/29/2020 FINDINGS: CT CHEST FINDINGS Cardiovascular: Heart is enlarged. Coronary artery calcification is evident. Atherosclerotic calcification is noted in the wall of the thoracic aorta. Permanent pacemaker noted. Left Port-A-Cath tip is positioned in the distal SVC. Mediastinum/Nodes: Previously described low right paratracheal 11 mm node is similar today at 12 mm. 10 mm short axis right hilar node measured previously is 10 mm again today (image 24/2). No left hilar lymphadenopathy. The esophagus has normal imaging features. There is no axillary lymphadenopathy. Lungs/Pleura: Centrilobular emphsyema noted. Right suprahilar upper lobe nodule measured previously at 2.4 x 1.9 cm measures 2.3 x 1.9 cm today (52/5), not substantially changed. 7 x 7 mm posterior left lower lobe  nodule (76/5) is stable. Tiny nodule in the left apex (21/5) is also stable. No new suspicious nodule or mass. No pleural effusion. Musculoskeletal: No worrisome lytic or sclerotic osseous abnormality. CT ABDOMEN PELVIS FINDINGS Hepatobiliary: No suspicious focal abnormality within the liver parenchyma. Gallbladder is surgically absent. No intrahepatic or extrahepatic biliary dilation. Pancreas: No focal mass lesion. No dilatation of the main duct. No intraparenchymal cyst. No peripancreatic edema. Spleen: No splenomegaly. No focal mass lesion. Adrenals/Urinary Tract: No adrenal nodule or mass. 3 mm nonobstructing stone identified interpolar left kidney with potential additional tiny nonobstructing stones bilaterally although vascular calcification noted in the hilum of each kidney is well. No evidence for hydroureter. The urinary bladder appears normal for the degree of distention. Stomach/Bowel: Stomach is unremarkable. No gastric wall thickening. No evidence of outlet obstruction. Duodenum is normally positioned as is the  ligament of [place]. No small bowel wall thickening. No small bowel dilatation. The terminal ileum is normal. The appendix is not visualized, but there is no edema or inflammation in the region of the cecum. No gross colonic mass. No colonic wall thickening. Vascular/Lymphatic: There is abdominal aortic atherosclerosis without aneurysm. There is no gastrohepatic or hepatoduodenal ligament lymphadenopathy. No retroperitoneal or mesenteric lymphadenopathy. No pelvic sidewall lymphadenopathy. Reproductive: The uterus is unremarkable.  There is no adnexal mass. Other: No intraperitoneal free fluid. Musculoskeletal: Right hip replacement. Similar sclerotic changes in the left iliac bone along the SI joint. IMPRESSION: 1. Stable exam. No suspicious new or progressive findings. 2. Stable appearance of the right suprahilar and left lower lobe pulmonary nodules with stable mild mediastinal and right hilar  nodes. 3. Right pleural effusion has resolved in the interval. 4. Nonobstructing bilateral renal stones. 5. Aortic Atherosclerosis (ICD10-I70.0) and Emphysema (ICD10-J43.9). Electronically Signed   By: Misty Stanley M.D.   On: 06/22/2020 10:57    ASSESSMENT AND PLAN: This is a very pleasant 85 years old African-American female recently diagnosed with extensive stage small cell lung cancer presented with central right upper lobe lung mass in addition to left lower lobe lung nodule and mediastinal lymphadenopathy and metastatic bone lesion in the left iliac wing diagnosed in April 2021. The patient started her treatment today with carboplatin for AUC of 5 on day 1, etoposide 100 mg/M2 on days 1, 2 and 3 with Neulasta support in addition to Imfinzi 1500 mg every 3 weeks during the chemotherapy course followed by 1500 mg every 4 weeks as maintenance. Status post 6 cycles.  Starting from cycle #5 she is on maintenance treatment with Imfinzi every 4 weeks.  The patient continues to tolerate her treatment well with no concerning adverse effects. She had repeat CT scan of the chest performed recently.  I personally and independently reviewed the scans and discussed the results with the patient and her daughter. Her scan showed no concerning findings for disease progression. I recommended for her to continue her current treatment with Imfinzi and she will proceed with cycle #7 today. For the epigastric pain, she will continue with Protonix as prescribed. For the anemia of chronic disease, she received 2 units of PRBCs transfusion recently. The patient was advised to call immediately if she has any concerning symptoms in the interval.  The patient voices understanding of current disease status and treatment options and is in agreement with the current care plan.  All questions were answered. The patient knows to call the clinic with any problems, questions or concerns. We can certainly see the patient much  sooner if necessary.  Disclaimer: This note was dictated with voice recognition software. Similar sounding words can inadvertently be transcribed and may not be corrected upon review.

## 2020-06-27 NOTE — Patient Instructions (Signed)

## 2020-06-27 NOTE — Patient Instructions (Signed)
[facility] Discharge Instructions for Patients Receiving Chemotherapy  Today you received the following chemotherapy agent: durvalumab (Imfinzi)  To help prevent nausea and vomiting after your treatment, we encourage you to take your nausea medication as directed.   If you develop nausea and vomiting that is not controlled by your nausea medication, call the clinic.   BELOW ARE SYMPTOMS THAT SHOULD BE REPORTED IMMEDIATELY:  *FEVER GREATER THAN 100.5 F  *CHILLS WITH OR WITHOUT FEVER  NAUSEA AND VOMITING THAT IS NOT CONTROLLED WITH YOUR NAUSEA MEDICATION  *UNUSUAL SHORTNESS OF BREATH  *UNUSUAL BRUISING OR BLEEDING  TENDERNESS IN MOUTH AND THROAT WITH OR WITHOUT PRESENCE OF ULCERS  *URINARY PROBLEMS  *BOWEL PROBLEMS  UNUSUAL RASH Items with * indicate a potential emergency and should be followed up as soon as possible.  Feel free to call the clinic should you have any questions or concerns. The clinic phone number is [phone].  Please show the [facility] at check-in to the Emergency Department and triage nurse.

## 2020-06-27 NOTE — Progress Notes (Signed)
Patient was identified to be at risk for malnutrition on the MST secondary to weight loss and poor appetite.  85 year old female diagnosed with small cell lung cancer receiving immunotherapy and followed by Dr. Julien Nordmann.  Past medical history includes CAD, COPD, diabetes, hypertension, hypothyroidism.  Medications include Imodium, oxycodone, Protonix, and Compazine.  Labs include albumin 3.4.  Height: 5 feet 2 inches. Weight: 113.6 pounds on September 27.  Patient weighed 117 pounds July 2021. Usual body weight: 125-130 pounds per patient. BMI: 20.78. ECOG 1  Patient continues to report abdominal pain and diarrhea with oral intake.  She states she is not able to eat much because of the pain. Imodium has helped with diarrhea.  States when she takes Imodium she only has 1-2 soft stools a day. She drinks 1 original boost daily.  Nutrition diagnosis:  Unintended weight loss related to cancer and associated treatments as evidenced by approximate 10% weight loss from usual body weight.  Intervention: Educated patient to consume smaller more frequent meals and snacks with high-calorie, high-protein foods. Recommended patient increase boost to boost plus 3 times daily.  Provided coupons. Encouraged patient to take Imodium per MD instructions. Provided fact sheets.  Questions were answered.  Teach back method used.  Contact information given.  Monitoring, evaluation, goals: Patient will tolerate increased calories and protein to minimize weight loss throughout treatment.  Next visit: To be scheduled with upcoming treatment as needed.  **Disclaimer: This note was dictated with voice recognition software. Similar sounding words can inadvertently be transcribed and this note may contain transcription errors which may not have been corrected upon publication of note.**

## 2020-06-30 ENCOUNTER — Telehealth: Payer: Self-pay | Admitting: Medical Oncology

## 2020-06-30 NOTE — Telephone Encounter (Signed)
COVID exposure-Pts daughter , tested positive for COVID on Tuesday and is hopitalized.  Lora has been around her , but his asymptomatic.  I told pt to quarantine for 10-14 days and get tested if she develops symptoms. Daughter understands. Next appt 10/25.  Black stool-Dtr mentioned pt has "black stools" . Alexa started iron recently . Denies BRB , pain , dizziness, lightheaded. I told [name] to monitor pt for now and call for any new symptoms.

## 2020-07-01 ENCOUNTER — Telehealth: Payer: Self-pay | Admitting: Internal Medicine

## 2020-07-01 NOTE — Telephone Encounter (Signed)
Scheduled per los. Called and spoke with patient. Let her know appts have been scheduled. She will let her family know to call back to make sure the appts work for their schedule.

## 2020-07-05 NOTE — Chronic Care Management (AMB) (Signed)
Chronic Care Management Pharmacy  Name: [name]  MRN: [id number] DOB: 04-23-1935   Chief Complaint/ HPI  [name],  85 y.o. , female presents for their Initial CCM visit with the clinical pharmacist via telephone.  PCP : [name], [name], MD Patient Care Team: [name], [name], MD as PCP - General (Family Medicine) Constance Haw, MD as PCP - Electrophysiology (Cardiology) [name], MD as PCP - Cardiology (Cardiology) Germaine Pomfret, [facility] as Pharmacist (Pharmacist)  Their chronic conditions include: Hypertension, Hyperlipidemia, Diabetes, Atrial Fibrillation, Coronary Artery Disease, GERD, COPD, Hypothyroidism and Chronic Diarrhea, and Lung Cancer   Office Visits: 04/27/20: Patient presented to Dr. [name] for follow-up. A1c 5.4%. Patient started on Doxepin 3 mg solution at bedtime for constipation.   Consult Visit: 06/27/20: Patient presented to Dr. Julien Nordmann (Oncology) for cancer follow-up.  05/26/20: Patient presented to Roderic Palau, NP (Cardiology) for follow-up.   No Known Allergies  Medications: Outpatient Encounter Medications as of 07/07/2020  Medication Sig Note   albuterol (VENTOLIN HFA) 108 (90 Base) MCG/ACT inhaler TAKE 2 PUFFS BY MOUTH EVERY 6 HOURS AS NEEDED FOR WHEEZE OR SHORTNESS OF BREATH    apixaban (ELIQUIS) 2.5 MG TABS tablet Take 1 tablet (2.5 mg total) by mouth 2 (two) times daily.    atorvastatin (LIPITOR) 10 MG tablet TAKE 1 TABLET BY MOUTH EVERY DAY    carvedilol (COREG) 12.5 MG tablet TAKE 1 TABLET (12.5 MG TOTAL) BY MOUTH 2 (TWO) TIMES DAILY WITH A MEAL.    doxepin (SINEQUAN) 10 MG/ML solution Take 0.3 mLs (3 mg total) by mouth at bedtime.    fenofibrate (TRICOR) 48 MG tablet TAKE 1 TABLET BY MOUTH EVERY DAY (Patient taking differently: Take 48 mg by mouth daily after lunch. )    levothyroxine (SYNTHROID) 112 MCG tablet TAKE 1 TABLET (112 MCG TOTAL) BY MOUTH DAILY BEFORE BREAKFAST.    lidocaine-prilocaine (EMLA) cream Apply 1  application topically as needed.    linagliptin (TRADJENTA) 5 MG TABS tablet TAKE 1 TABLET (5 MG TOTAL) DAILY BY MOUTH. (Patient taking differently: Take 5 mg by mouth daily after breakfast. )    Loperamide HCl (IMODIUM PO) Take by mouth as needed.    losartan (COZAAR) 50 MG tablet Take 2 tablets by mouth daily.    pantoprazole (PROTONIX) 40 MG tablet TAKE 1 TABLET BY MOUTH EVERY DAY    potassium chloride SA (KLOR-CON) 20 MEQ tablet Take 1 tablet (20 mEq total) by mouth daily.    acetaminophen (TYLENOL) 500 MG tablet Take 500 mg by mouth every 6 (six) hours as needed for headache.    diclofenac sodium (VOLTAREN) 1 % GEL Apply 4 g topically 4 (four) times daily. (Patient not taking: Reported on 07/07/2020)    glucose blood (ONETOUCH VERIO) test strip Use to test 3-4 times daily.    nitroGLYCERIN (NITROSTAT) 0.4 MG SL tablet Place 1 tablet (0.4 mg total) under the tongue every 5 (five) minutes x 3 doses as needed for chest pain (Max 3 doses within 15 min. Call 911).    oxyCODONE-acetaminophen (PERCOCET/ROXICET) 5-325 MG tablet Take 1 tablet by mouth every 8 (eight) hours as needed for severe pain. (Patient not taking: Reported on 07/07/2020) 02/18/2020: Not started   prochlorperazine (COMPAZINE) 10 MG tablet Take 1 tablet (10 mg total) by mouth every 6 (six) hours as needed for nausea or vomiting. (Patient not taking: Reported on 07/07/2020)    No facility-administered encounter medications on file as of 07/07/2020.    Wt  Readings from Last 3 Encounters:  06/27/20 113 lb 9.6 oz (51.5 kg)  05/30/20 111 lb 9.6 oz (50.6 kg)  05/26/20 110 lb 3.2 oz (50 kg)    Current Diagnosis/Assessment:  SDOH Interventions     Most Recent Value  SDOH Interventions  Financial Strain Interventions Intervention Not Indicated  Transportation Interventions Intervention Not Indicated      Goals Addressed            This Visit's Progress    Chronic Care Management       CARE PLAN ENTRY (see  longitudinal plan of care for additional care plan information)  Current Barriers:   Chronic Disease Management support, education, and care coordination needs related to Hypertension, Hyperlipidemia, Diabetes, Atrial Fibrillation, Coronary Artery Disease, GERD, COPD, Hypothyroidism and Chronic Diarrhea, and Lung Cancer   Hypertension BP Readings from Last 3 Encounters:  06/27/20 (!) 151/49  05/30/20 (!) 101/45  05/26/20 (!) 118/50    Pharmacist Clinical Goal(s): o Over the next 90 days, patient will work with PharmD and providers to maintain BP goal <140/90  Current regimen:   Carvedilol 12.5 mg twice daily   Losartan 50 mg 2 tablets daily  Interventions: o Discussed low salt diet and exercising as tolerated extensively o Will initiate blood pressure monitoring plan   Patient self care activities - Over the next 90 days, patient will: o Check Blood Pressure weekly, document, and provide at future appointments o Ensure daily salt intake < 2300 mg/day  Hyperlipidemia Lab Results  Component Value Date/Time   LDLCALC 76 06/10/2019 09:29 AM    Pharmacist Clinical Goal(s): o Over the next 90 days, patient will work with PharmD and providers to achieve LDL goal < 70  Current regimen:   Atorvastatin 10 mg daily   Fenofibrate 48 mg daily  Interventions: o Discussed low cholesterol diet and exercising as tolerated extensively o Will initiate cholesterol monitoring plan   Diabetes Lab Results  Component Value Date/Time   HGBA1C 5.4 04/27/2020 02:35 PM   HGBA1C 6.5 09/30/2019 08:26 AM   HGBA1C 6.2 06/10/2019 09:29 AM    Pharmacist Clinical Goal(s): o Over the next 90 days, patient will work with PharmD and providers to maintain A1c goal <7%  Current regimen:  o Tradjenta 5 mg daily   Interventions: o Discussed carbohydrate counting and exercising as tolerated extensively o Will initiate blood sugar monitoring plan   Patient self care activities - Over the next  90 days, patient will: o Check blood sugar in the morning before eating or drinking, document, and provide at future appointments o Contact provider with any episodes of hypoglycemia  Medication management  Pharmacist Clinical Goal(s): o Over the next 90 days, patient will work with PharmD and providers to achieve optimal medication adherence  [facility]  Interventions o Comprehensive medication review performed. o Utilize [facility] for medication synchronization, packaging and delivery o Verbal consent obtained for [facility] enhanced pharmacy services (medication synchronization, adherence packaging, delivery coordination). A medication sync plan was created to allow patient to get all medications delivered once every 30 to 90 days per patient preference. Patient understands they have freedom to choose pharmacy and clinical pharmacist will coordinate care between all prescribers and [facility].  Patient self care activities - Over the next 90 days, patient will: o Take medications as prescribed o Report any questions or concerns to PharmD and/or provider(s)      Diabetes   A1c goal <7%  Recent Relevant Labs: Lab Results  Component Value Date/Time   HGBA1C 5.4 04/27/2020 02:35 PM   HGBA1C 6.5 09/30/2019 08:26 AM   HGBA1C 6.2 06/10/2019 09:29 AM   GFR 117.94 12/15/2019 11:23 AM   GFR 85.50 09/30/2019 08:26 AM   MICROALBUR 6.0 (H) 06/10/2019 09:29 AM   MICROALBUR 4.2 (H) 11/12/2018 09:56 AM    Last diabetic Eye exam: No results found for: HMDIABEYEEXA  Last diabetic Foot exam: No results found for: HMDIABFOOTEX   Checking BG: 2-3 times weekly  Recent FBG Readings: 88, typically 100-110 Recent pre-meal BG readings: n/a Recent 2hr PP BG readings:  n/a Recent HS BG readings: n/a  Patient has failed these meds in past: n/a Patient is currently controlled on the following medications:  Tradjenta 5 mg daily   We discussed: diet and  exercise extensively. Patient has been working to cut back on bread.   Plan  Recommend stopping Tradjenta due to excellent BG control.   Hypertension   BP goal is:  <140/90  Office blood pressures are  BP Readings from Last 3 Encounters:  06/27/20 (!) 151/49  05/30/20 (!) 101/45  05/26/20 (!) 118/50   CMP Latest Ref Rng & Units 06/27/2020 05/30/2020 05/02/2020  Glucose 70 - 99 mg/dL 69(L) 83 84  BUN 8 - 23 mg/dL '11 19 12  ' Creatinine 0.44 - 1.00 mg/dL 0.70 0.73 0.61  Sodium 135 - 145 mmol/L 140 141 144  Potassium 3.5 - 5.1 mmol/L 3.8 4.4 3.1(L)  Chloride 98 - 111 mmol/L 111 112(H) 113(H)  CO2 22 - 32 mmol/L '26 24 24  ' Calcium 8.9 - 10.3 mg/dL 9.0 9.7 9.2  Total Protein 6.5 - 8.1 g/dL 6.2(L) 6.1(L) 5.9(L)  Total Bilirubin 0.3 - 1.2 mg/dL 0.3 0.4 0.5  Alkaline Phos 38 - 126 U/L 48 55 82  AST 15 - 41 U/L 12(L) 11(L) 13(L)  ALT 0 - 44 U/L '13 9 17     ' Patient checks BP at home infrequently Patient home BP readings are ranging: n/a  Patient has failed these meds in the past: n/a Patient is currently controlled on the following medications:   Carvedilol 12.5 mg twice daily   Losartan 50 mg 2 tablets daily  We discussed diet and exercise extensively  Plan  Recommend switching Losartan to 100 mg 1 tablet daily to minimize pill burden.   AFIB   Patient is currently rate controlled.  Patient has failed these meds in past: n/a Patient is currently controlled on the following medications:   Carvedilol 12.5 mg twice daily   Eliquis 2.5 mg twice daily  We discussed:  Denies unusual bruising/bleeding or affordability concerns with Eliquis.   Plan  Continue current medications  Hyperlipidemia   LDL goal < 70  Lipid Panel     Component Value Date/Time   CHOL 156 06/10/2019 0929   TRIG 79.0 06/10/2019 0929   HDL 64.30 06/10/2019 0929   LDLCALC 76 06/10/2019 0929    Hepatic Function Latest Ref Rng & Units 06/27/2020 05/30/2020 05/02/2020  Total Protein 6.5 - 8.1 g/dL  6.2(L) 6.1(L) 5.9(L)  Albumin 3.5 - 5.0 g/dL 3.3(L) 3.4(L) 3.4(L)  AST 15 - 41 U/L 12(L) 11(L) 13(L)  ALT 0 - 44 U/L '13 9 17  ' Alk Phosphatase 38 - 126 U/L 48 55 82  Total Bilirubin 0.3 - 1.2 mg/dL 0.3 0.4 0.5  Bilirubin, Direct 0.1 - 0.5 mg/dL - - -     The ASCVD Risk score Mikey Bussing DC Jr., et al., 2013) failed to calculate for the following  reasons:   The 2013 ASCVD risk score is only valid for ages 52 to 77   Patient has failed these meds in past: n/a Patient is currently controlled on the following medications:   Atorvastatin 10 mg daily   Fenofibrate 48 mg daily   We discussed: Given lack of hypertriglyceridemia and patient's excellent DM control, if lipid panel is stable recommend stopping fenofibrate and increasing atorvastatin to 20 mg daily.   Plan  Continue current medications  COPD   Last spirometry score: n/a  Gold Grade: n/a Current COPD Classification:  A (low sx, <2 exacerbations/yr)  Eosinophil count:   Lab Results  Component Value Date/Time   EOSPCT 2 06/27/2020 09:24 AM  %                               Eos (Absolute):  Lab Results  Component Value Date/Time   EOSABS 0.1 06/27/2020 09:24 AM    Tobacco Status:  Social History   Tobacco Use  Smoking Status Former Smoker   Packs/day: 0.75   Years: 66.00   Pack years: 49.50   Quit date: 12/27/2019   Years since quitting: 0.5  Smokeless Tobacco Never Used    Patient has failed these meds in past: n/a Patient is currently controlled on the following medications:   Ventolin HFA   Using maintenance inhaler regularly? No Frequency of rescue inhaler use:  several times per month  We discussed:  proper inhaler technique. Mainly has exertional SOB.   Plan  Continue current medications  Hypothyroidism   Lab Results  Component Value Date/Time   TSH 1.536 06/27/2020 09:24 AM   TSH 0.486 05/30/2020 09:54 AM   TSH 0.64 06/10/2019 09:29 AM   TSH 0.42 10/09/2017 09:47 AM   FREET4 1.19  05/10/2017 10:59 AM    Patient has failed these meds in past: n/a Patient is currently controlled on the following medications:   Levothyroxine 112 mcg daily   We discussed:  Seperates from breakfast by at least 30 minutes   Plan  Continue current medications  GERD   Patient denies dysphagia, heartburn or nausea. Expresses understanding to avoid triggers such as citrus juices, large meals and lying down after eating.  Currently controlled on:  Pantoprazole 40 mg daily   Plan   Continue current medication.  Chronic Pain   Patient has failed these meds in past: n/a Patient is currently controlled on the following medications:   APAP 500 mg q6hr PRN   Voltaren 1% gel   Emla Cream   Oxycodone-APAP 5-325 mg q8hr PRN   Chronic Diarrhea   Possible IBS component   Patient has failed these meds in past: Dicyclomine (ineffective)  Patient is currently controlled on the following medications:   Doxepin 30m/mL 3 mg QHS   Loperamide 2 mg   We discussed:  Patient unsure why she is taking doxepin. Counseled patient that we were using this to reduce diarrhea, not manage depression.   TCA use not ideal due to elderly age, would prefer trying Linzess or other approved anti-diarrheal options.   Plan  Continue current medications  Misc / OTC     Nitroglycerin PRN   Potassium 20 mEq daily   Prochlorperazine 10 mg q6hr PRN   Ferrous Sulfate 65 mg 1 tablet nightly  Plan  Continue current medications   Medication Management   [facility] Uses pill box? Yes, which her daughter/grandaughter  helps her fill Pt endorses 100% compliance, but does reports difficulty with keeping up with her medications.  We discussed: Verbal consent obtained for [facility] enhanced pharmacy services (medication synchronization, adherence packaging, delivery coordination). A medication sync plan was created to allow patient to get all medications  delivered once every 30 to 90 days per patient preference. Patient understands they have freedom to choose pharmacy and clinical pharmacist will coordinate care between all prescribers and [facility].  Plan  Utilize [facility] for medication synchronization, packaging and delivery  Follow up: 6 month phone visit  [facility] [place]

## 2020-07-06 ENCOUNTER — Telehealth: Payer: Self-pay

## 2020-07-06 NOTE — Progress Notes (Signed)
Spoke to patient to confirmed patient telephone  appointment on 07/07/2020 for CCM at 2:00PM with Junius Argyle the Clinical pharmacist.   Patient Verbalized understanding. Patient denies any side effects from her current medications.  [place] Pharmacist Assistant [phone]

## 2020-07-07 ENCOUNTER — Ambulatory Visit: Payer: Medicare Other

## 2020-07-07 DIAGNOSIS — E1169 Type 2 diabetes mellitus with other specified complication: Secondary | ICD-10-CM

## 2020-07-12 ENCOUNTER — Telehealth: Payer: Self-pay

## 2020-07-12 NOTE — Progress Notes (Signed)
[facility] boarding [facility].  [place] Pharmacist Assistant [phone]

## 2020-07-13 ENCOUNTER — Telehealth: Payer: Self-pay | Admitting: Cardiology

## 2020-07-13 MED ORDER — ATORVASTATIN CALCIUM 10 MG PO TABS
10.0000 mg | ORAL_TABLET | Freq: Every day | ORAL | 0 refills | Status: DC
Start: 2020-07-13 — End: 2020-12-12

## 2020-07-13 NOTE — Telephone Encounter (Signed)
Pt's medication was sent to pt's pharmacy as requested. Confirmation received.  °

## 2020-07-13 NOTE — Telephone Encounter (Signed)
  *  STAT* If patient is at the pharmacy, call can be transferred to refill team.   1. Which medications need to be refilled? (please list name of each medication and dose if known) atorvastatin (LIPITOR) 10 MG tablet  2. Which pharmacy/location (including street and city if local pharmacy) is medication to be sent to? [facility] - [place], [place] - [place] [address]  3. Do they need a 30 day or 90 day supply? 90 days

## 2020-07-14 ENCOUNTER — Other Ambulatory Visit: Payer: Self-pay | Admitting: Family Medicine

## 2020-07-19 ENCOUNTER — Telehealth: Payer: Self-pay

## 2020-07-19 DIAGNOSIS — K529 Noninfective gastroenteritis and colitis, unspecified: Secondary | ICD-10-CM

## 2020-07-19 MED ORDER — CARVEDILOL 12.5 MG PO TABS
12.5000 mg | ORAL_TABLET | Freq: Two times a day (BID) | ORAL | 2 refills | Status: DC
Start: 2020-07-19 — End: 2020-10-18

## 2020-07-19 MED ORDER — DOXEPIN HCL 10 MG/ML PO CONC: 3.0000 mg | Freq: Every day | ORAL | 1 refills | Status: DC

## 2020-07-19 NOTE — Telephone Encounter (Signed)
-----   Message from Germaine Pomfret, [facility] sent at 07/19/2020  8:22 AM EDT ----- Regarding: Refill Request Callyn Severtson,  Patient needs a refill of Carvedilol and Doxepin sent into [facility]. If possible, please send in for 90DS.    Thanks, Doristine Section Clinical Pharmacist [facility] [place]

## 2020-07-20 NOTE — Progress Notes (Signed)
[facility] OFFICE PROGRESS NOTE  [name], [name], MD [place] [address]  DIAGNOSIS: Extensive stage (T2 a, N2, M1 C) small cell lung cancer presented with central right upper lobe lung mass in addition to left lower lobe pulmonary metastasis and bilateral hilar, subcarinal and bilateral paratracheal and left prevascular lymphadenopathy in addition to bone metastasis in the left iliac wing diagnosed in April 2021.  PRIOR THERAPY: None  CURRENT THERAPY: Systemic chemotherapy with carboplatin for AUC of 5 on day 1, etoposide 100 mg/M2 on days 1, 2 and 3 with Neulasta support in addition to Imfinzi 1500 mg IV every 3 weeks during chemotherapy followed by maintenance Imfinzi 1500 mg IV every 4 weeks after cycle #4.Status post7cycles. She started maintenance immunotherapy with single agent Imfinzi starting from cycle #5  INTERVAL HISTORY: [name] 85 y.o. female returns to the clinic today for a follow up visit. The patient is feeling fair without any concerning complaints except for intermittent abdominal discomfort. She is not having any pain at this time but states that it woke her up from sleep this morning. She mostly localizes her pain to the epigastric region. The pain is worse before eating and then after eating but subsides within 10-15 minutes of taking her routine morning medications. She does not have a gastroenterologist. She had a CT of her abdomen last month which was unremarkable for a clear etiology. She states her pain started last month ago and is intermittent. She reports belching and feeling like her belch is "getting caught in her throat". She denies NSAID use. She is currently on protonix and is compliant with this. She denies nausea, anorexia, vomiting, chills, fevers, or bloody stool. She lost about 6 lbs since her last appointment. She has chronic diarrhea which is controlled with taking imodium if needed. Of note, there are orders in  the computer for h. Pylori which was ordered by her PCP but has not been performed yet. The patient is unsure if she has a history of stomach ulcers.   She tolerated her last cycle of immunotherapy with Imfinzi  well without any concerning adverse side effects.  She reports one episode of night sweats last night but reports that it was hot in her room. She denies any significant cough or hemoptysis but she reports some persistent dyspnea on exertion; however, this is greatly improved compared to when she first started treatment in which she was on supplemental oxygen. She denies any headache or visual changes.  The patient is here today for evaluation before starting cycle #8.  MEDICAL HISTORY: Past Medical History:  Diagnosis Date  . CAD (coronary artery disease), native coronary artery    PTCA of distal right coronary artery 1997 Cypher stents to circumflex 2005 Cardiac cath in 2011 with patent stent to circumflex and moderate disease elsewhere treated medically   . Cancer ([place])   . Cardiac pacemaker in situ 12/24/2015   Original implant reportedly in 1991 for tachybradycardia syndrome, generator change in 1999, 2004 and evidently again in 2016 in [place]   . COPD (chronic obstructive pulmonary disease) ([place])   . Diabetes mellitus without complication ([place])   . Hypertension   . Hypothyroidism 03/23/2010  . Pacemaker   . Paroxysmal atrial fibrillation ([place]) 03/23/2010   CHA2DS2VASC score 5     ALLERGIES:  has No Known Allergies.  MEDICATIONS:  Current Outpatient Medications  Medication Sig Dispense Refill  . acetaminophen (TYLENOL) 500 MG tablet Take 500 mg by  mouth every 6 (six) hours as needed for headache.    . albuterol (VENTOLIN HFA) 108 (90 Base) MCG/ACT inhaler TAKE 2 PUFFS BY MOUTH EVERY 6 HOURS AS NEEDED FOR WHEEZE OR SHORTNESS OF BREATH 1 each 1  . apixaban (ELIQUIS) 2.5 MG TABS tablet Take 1 tablet (2.5 mg total) by mouth 2 (two) times daily. 60 tablet 5  . atorvastatin  (LIPITOR) 10 MG tablet Take 1 tablet (10 mg total) by mouth daily. Please make yearly appt with Dr. Tamala Julian for November for future refills. Thank you 1st attempt 90 tablet 0  . carvedilol (COREG) 12.5 MG tablet Take 1 tablet (12.5 mg total) by mouth 2 (two) times daily with a meal. 180 tablet 2  . diclofenac sodium (VOLTAREN) 1 % GEL Apply 4 [name] topically 4 (four) times daily. 4 Tube 3  . doxepin (SINEQUAN) 10 MG/ML solution Take 0.3 mLs (3 mg total) by mouth at bedtime. 30 mL 1  . fenofibrate (TRICOR) 48 MG tablet TAKE 1 TABLET BY MOUTH EVERY DAY (Patient taking differently: Take 48 mg by mouth daily after lunch. ) 90 tablet 3  . glucose blood (ONETOUCH VERIO) test strip Use to test 3-4 times daily. 300 strip 4  . levothyroxine (SYNTHROID) 112 MCG tablet TAKE 1 TABLET (112 MCG TOTAL) BY MOUTH DAILY BEFORE BREAKFAST. 90 tablet 2  . lidocaine-prilocaine (EMLA) cream Apply 1 application topically as needed. 30 [name] 2  . linagliptin (TRADJENTA) 5 MG TABS tablet TAKE 1 TABLET (5 MG TOTAL) DAILY BY MOUTH. (Patient taking differently: Take 5 mg by mouth daily after breakfast. ) 90 tablet 1  . Loperamide HCl (IMODIUM PO) Take by mouth as needed.    Marland Kitchen losartan (COZAAR) 50 MG tablet Take 2 tablets by mouth daily. 180 tablet 1  . nitroGLYCERIN (NITROSTAT) 0.4 MG SL tablet Place 1 tablet (0.4 mg total) under the tongue every 5 (five) minutes x 3 doses as needed for chest pain (Max 3 doses within 15 min. Call 911). 10 tablet 0  . oxyCODONE-acetaminophen (PERCOCET/ROXICET) 5-325 MG tablet Take 1 tablet by mouth every 8 (eight) hours as needed for severe pain. 20 tablet 0  . pantoprazole (PROTONIX) 40 MG tablet TAKE 1 TABLET BY MOUTH EVERY DAY 90 tablet 1  . potassium chloride SA (KLOR-CON) 20 MEQ tablet Take 1 tablet (20 mEq total) by mouth daily. 7 tablet 0  . prochlorperazine (COMPAZINE) 10 MG tablet Take 1 tablet (10 mg total) by mouth every 6 (six) hours as needed for nausea or vomiting. 30 tablet 1   No current  facility-administered medications for this visit.    SURGICAL HISTORY:  Past Surgical History:  Procedure Laterality Date  . BIOPSY  01/08/2020   Procedure: BIOPSY;  Surgeon: Garner Nash, DO;  Location: [facility];  Service: Pulmonary;;  . BRONCHIAL BRUSHINGS  01/08/2020   Procedure: BRONCHIAL BRUSHINGS;  Surgeon: Garner Nash, DO;  Location: [facility];  Service: Pulmonary;;  . BRONCHIAL WASHINGS  01/08/2020   Procedure: BRONCHIAL WASHINGS;  Surgeon: Garner Nash, DO;  Location: [facility];  Service: Pulmonary;;  . CARDIAC CATHETERIZATION N/A 12/26/2015   Procedure: Left Heart Cath and Coronary Angiography;  Surgeon: Peter M [name], MD;  Location: [facility];  Service: Cardiovascular;  Laterality: N/A;  . CARDIOVERSION  05/2015  . CHOLECYSTECTOMY  2012  . CORONARY ANGIOPLASTY WITH STENT PLACEMENT  1990  . ENDOBRONCHIAL ULTRASOUND  01/08/2020   Procedure: ENDOBRONCHIAL ULTRASOUND;  Surgeon: Garner Nash, DO;  Location: [facility];  Service: Pulmonary;;  . FINE NEEDLE ASPIRATION BIOPSY  01/08/2020   Procedure: FINE NEEDLE ASPIRATION BIOPSY;  Surgeon: Garner Nash, DO;  Location: [facility];  Service: Pulmonary;;  . HERNIA REPAIR    . IR IMAGING GUIDED PORT INSERTION  02/18/2020  . PACEMAKER GENERATOR CHANGE  [id number], 2016  . PACEMAKER INSERTION  1991  . VIDEO BRONCHOSCOPY WITH ENDOBRONCHIAL ULTRASOUND N/A 01/08/2020   Procedure: VIDEO BRONCHOSCOPY;  Surgeon: Garner Nash, DO;  Location: [facility];  Service: Pulmonary;  Laterality: N/A;    REVIEW OF SYSTEMS:   Review of Systems  Constitutional: Positive for weight loss. Negative for appetite change, chills, fatigue, and fever.  HENT: Negative for mouth sores, nosebleeds, sore throat and trouble swallowing.   Eyes: Negative for eye problems and icterus.  Respiratory: Positive for baseline mild dyspnea on exertion. Negative for cough, hemoptysis, and wheezing.   Cardiovascular: Negative for chest pain  and leg swelling.  Gastrointestinal: Positive for belching, chronic diarrhea, and intermittent epigastric pain. Negative forconstipation, nausea and vomiting.  Genitourinary: Negative for bladder incontinence, difficulty urinating, dysuria, frequency and hematuria.   Musculoskeletal: Negative for back pain, gait problem, neck pain and neck stiffness.  Skin: Negative for itching and rash.  Neurological: Negative for dizziness, extremity weakness, gait problem, headaches, light-headedness and seizures.  Hematological: Negative for adenopathy. Does not bruise/bleed easily.  Psychiatric/Behavioral: Negative for confusion, depression and sleep disturbance. The patient is not nervous/anxious.     PHYSICAL EXAMINATION:  Blood pressure (!) 136/45, pulse 63, temperature (!) 97.3 F (36.3 C), temperature source Tympanic, resp. rate 16, height 5\' 2"  (1.575 m), weight 107 lb 9.6 oz (48.8 kg), SpO2 100 %.  ECOG PERFORMANCE STATUS: 1 - Symptomatic but completely ambulatory  Physical Exam  Constitutional: Oriented to person, place, and time and thin appearing female and in no distress.  HENT:  Head: Normocephalic and atraumatic.  Mouth/Throat: Oropharynx is clear and moist. No oropharyngeal exudate.  Eyes: Conjunctivae are normal. Right eye exhibits no discharge. Left eye exhibits no discharge. No scleral icterus.  Neck: Normal range of motion. Neck supple.  Cardiovascular: Normal rate, regular rhythm, normal heart sounds and intact distal pulses.   Pulmonary/Chest: Effort normal and breath sounds normal. No respiratory distress. No wheezes. No rales.  Abdominal: Soft. Bowel sounds are normal. Exhibits no distension and no mass. sore to palpation in epigastric and LUQ. Negative murphy's sign. No rebound tenderness.   Musculoskeletal: Normal range of motion. Exhibits no edema.  Lymphadenopathy:    No cervical adenopathy.  Neurological: Alert and oriented to person, place, and time. Exhibits normal  muscle tone. Gait normal. Coordination normal.  Skin: Skin is warm and dry. No rash noted. Not diaphoretic. No erythema. No pallor.  Psychiatric: Mood, memory and judgment normal.  Vitals reviewed.  LABORATORY DATA: Lab Results  Component Value Date   WBC 4.0 07/25/2020   HGB 11.0 (L) 07/25/2020   HCT 34.7 (L) 07/25/2020   MCV 95.1 07/25/2020   PLT 127 (L) 07/25/2020      Chemistry      Component Value Date/Time   NA 139 07/25/2020 1124   NA 143 03/17/2020 0915   K 4.9 07/25/2020 1124   CL 108 07/25/2020 1124   CO2 28 07/25/2020 1124   BUN 20 07/25/2020 1124   BUN 6 (L) 03/17/2020 0915   CREATININE 0.94 07/25/2020 1124      Component Value Date/Time   CALCIUM 9.9 07/25/2020 1124   ALKPHOS 56 07/25/2020 1124  AST 15 07/25/2020 1124   ALT 16 07/25/2020 1124   BILITOT 0.4 07/25/2020 1124       RADIOGRAPHIC STUDIES:  No results found.   ASSESSMENT/PLAN:  This is a very pleasant 85 year old african [place] female with extensive stage small cell lung cancer. She presented with a central right upper lobe lung mass in addition to left lower lobe nodule and mediastinal lymphadenopathy and a metastatic bone lesion in the left iliac wing. She was diagnosed in April 2021.   The patient is currently undergoing treatment with palliative systemic chemotherapy with carboplatin for an AUC of 5 on day 1, etoposide 100 mg/m2 on days 1, 2, and 3 and Imfinzi 1500 mg every 3 weeks. Starting from cycle #5, the patient has been on maintenance immunotherapy with Imfinzi. She is status post7cycles.   Labs were reviewed. Recommend that she proceed with cycle #8today as scheduled.   We will see her back for follow-up visit in 4 weeks for evaluation before starting cycle #9.  Regarding her abdominal pain, I discussed the symptoms with Dr. Julien Nordmann. The patient denies abdominal pain at this time. LFTs unremarkable. No leukocytosis. No etiology was visualized on her recent CT scan of the  abdomen and pelvis. The patient is afebrile and non-toxic appearing. The patient was feeling well at her appointment and laughing and making jokes during her appointment today. I offered to refer her to GI but she politely declined. She was instructed to continue taking Protonix and use gas x if needed. She was encouraged to follow up with her PCP to complete the h.pylori testing. I discussed worrisome symptoms that would warrant immediate evaluation such as fevers, chills, nausea/vomiting, hypotension, constant/worsening abdominal pain, etc. She expressed understanding.  The patient was advised to call immediately if she has any concerning symptoms in the interval. The patient voices understanding of current disease status and treatment options and is in agreement with the current care plan. All questions were answered. The patient knows to call the clinic with any problems, questions or concerns. We can certainly see the patient much sooner if necessary     Orders Placed This Encounter  Procedures  . CBC with Differential (Cancer Center Only)    Standing Status:   Standing    Number of Occurrences:   18    Standing Expiration Date:   07/25/2021  . CMP ([facility] only)    Standing Status:   Standing    Number of Occurrences:   18    Standing Expiration Date:   07/25/2021     [name] L Colletta Spillers, PA-C 07/25/20

## 2020-07-22 ENCOUNTER — Other Ambulatory Visit: Payer: Self-pay

## 2020-07-22 DIAGNOSIS — C3491 Malignant neoplasm of unspecified part of right bronchus or lung: Secondary | ICD-10-CM

## 2020-07-22 NOTE — Progress Notes (Signed)
cm

## 2020-07-25 ENCOUNTER — Other Ambulatory Visit: Payer: Self-pay

## 2020-07-25 ENCOUNTER — Inpatient Hospital Stay: Payer: Medicare Other | Attending: Internal Medicine | Admitting: Physician Assistant

## 2020-07-25 ENCOUNTER — Inpatient Hospital Stay: Payer: Medicare Other

## 2020-07-25 ENCOUNTER — Encounter: Payer: Self-pay | Admitting: Physician Assistant

## 2020-07-25 VITALS — BP 136/45 | HR 63 | Temp 97.3°F | Resp 16 | Ht 62.0 in | Wt 107.6 lb

## 2020-07-25 DIAGNOSIS — I480 Paroxysmal atrial fibrillation: Secondary | ICD-10-CM | POA: Insufficient documentation

## 2020-07-25 DIAGNOSIS — C3491 Malignant neoplasm of unspecified part of right bronchus or lung: Secondary | ICD-10-CM

## 2020-07-25 DIAGNOSIS — I10 Essential (primary) hypertension: Secondary | ICD-10-CM | POA: Insufficient documentation

## 2020-07-25 DIAGNOSIS — C7951 Secondary malignant neoplasm of bone: Secondary | ICD-10-CM | POA: Diagnosis not present

## 2020-07-25 DIAGNOSIS — C3411 Malignant neoplasm of upper lobe, right bronchus or lung: Secondary | ICD-10-CM | POA: Diagnosis not present

## 2020-07-25 DIAGNOSIS — E119 Type 2 diabetes mellitus without complications: Secondary | ICD-10-CM | POA: Insufficient documentation

## 2020-07-25 DIAGNOSIS — R1013 Epigastric pain: Secondary | ICD-10-CM

## 2020-07-25 DIAGNOSIS — E039 Hypothyroidism, unspecified: Secondary | ICD-10-CM | POA: Diagnosis not present

## 2020-07-25 DIAGNOSIS — Z95828 Presence of other vascular implants and grafts: Secondary | ICD-10-CM

## 2020-07-25 DIAGNOSIS — Z5112 Encounter for antineoplastic immunotherapy: Secondary | ICD-10-CM | POA: Insufficient documentation

## 2020-07-25 LAB — CBC WITH DIFFERENTIAL (CANCER CENTER ONLY)
Abs Immature Granulocytes: 0.01 10*3/uL (ref 0.00–0.07)
Basophils Absolute: 0 10*3/uL (ref 0.0–0.1)
Basophils Relative: 1 %
Eosinophils Absolute: 0.1 10*3/uL (ref 0.0–0.5)
Eosinophils Relative: 2 %
HCT: 34.7 % — ABNORMAL LOW (ref 36.0–46.0)
Hemoglobin: 11 g/dL — ABNORMAL LOW (ref 12.0–15.0)
Immature Granulocytes: 0 %
Lymphocytes Relative: 39 %
Lymphs Abs: 1.6 10*3/uL (ref 0.7–4.0)
MCH: 30.1 pg (ref 26.0–34.0)
MCHC: 31.7 g/dL (ref 30.0–36.0)
MCV: 95.1 fL (ref 80.0–100.0)
Monocytes Absolute: 0.5 10*3/uL (ref 0.1–1.0)
Monocytes Relative: 11 %
Neutro Abs: 1.9 10*3/uL (ref 1.7–7.7)
Neutrophils Relative %: 47 %
Platelet Count: 127 10*3/uL — ABNORMAL LOW (ref 150–400)
RBC: 3.65 MIL/uL — ABNORMAL LOW (ref 3.87–5.11)
RDW: 12.8 % (ref 11.5–15.5)
WBC Count: 4 10*3/uL (ref 4.0–10.5)
nRBC: 0 % (ref 0.0–0.2)

## 2020-07-25 LAB — CMP (CANCER CENTER ONLY)
ALT: 16 U/L (ref 0–44)
AST: 15 U/L (ref 15–41)
Albumin: 3.8 g/dL (ref 3.5–5.0)
Alkaline Phosphatase: 56 U/L (ref 38–126)
Anion gap: 3 — ABNORMAL LOW (ref 5–15)
BUN: 20 mg/dL (ref 8–23)
CO2: 28 mmol/L (ref 22–32)
Calcium: 9.9 mg/dL (ref 8.9–10.3)
Chloride: 108 mmol/L (ref 98–111)
Creatinine: 0.94 mg/dL (ref 0.44–1.00)
GFR, Estimated: >60 mL/min (ref >60)
Glucose, Bld: 83 mg/dL (ref 70–99)
Potassium: 4.9 mmol/L (ref 3.5–5.1)
Sodium: 139 mmol/L (ref 135–145)
Total Bilirubin: 0.4 mg/dL (ref 0.3–1.2)
Total Protein: 6.8 g/dL (ref 6.5–8.1)

## 2020-07-25 LAB — TSH: TSH: 0.718 u[IU]/mL (ref 0.308–3.960)

## 2020-07-25 MED ORDER — SODIUM CHLORIDE 0.9 % IV SOLN
1500.0000 mg | Freq: Once | INTRAVENOUS | Status: AC
  Administered 2020-07-25: 1500 mg via INTRAVENOUS
  Filled 2020-07-25: qty 30

## 2020-07-25 MED ORDER — SODIUM CHLORIDE 0.9% FLUSH
10.0000 mL | INTRAVENOUS | Status: DC | PRN
  Administered 2020-07-25: 10 mL
  Filled 2020-07-25: qty 10

## 2020-07-25 MED ORDER — SODIUM CHLORIDE 0.9% FLUSH
10.0000 mL | Freq: Once | INTRAVENOUS | Status: AC
  Administered 2020-07-25: 10 mL
  Filled 2020-07-25: qty 10

## 2020-07-25 MED ORDER — HEPARIN SOD (PORK) LOCK FLUSH 100 UNIT/ML IV SOLN
500.0000 [IU] | Freq: Once | INTRAVENOUS | Status: AC | PRN
  Administered 2020-07-25: 500 [IU]
  Filled 2020-07-25: qty 5

## 2020-07-25 MED ORDER — SODIUM CHLORIDE 0.9 % IV SOLN
Freq: Once | INTRAVENOUS | Status: AC
  Filled 2020-07-25: qty 250

## 2020-07-25 NOTE — Patient Instructions (Signed)
[facility] Discharge Instructions for Patients Receiving Chemotherapy  Today you received the following chemotherapy agent: durvalumab (Imfinzi)  To help prevent nausea and vomiting after your treatment, we encourage you to take your nausea medication as directed.   If you develop nausea and vomiting that is not controlled by your nausea medication, call the clinic.   BELOW ARE SYMPTOMS THAT SHOULD BE REPORTED IMMEDIATELY:  *FEVER GREATER THAN 100.5 F  *CHILLS WITH OR WITHOUT FEVER  NAUSEA AND VOMITING THAT IS NOT CONTROLLED WITH YOUR NAUSEA MEDICATION  *UNUSUAL SHORTNESS OF BREATH  *UNUSUAL BRUISING OR BLEEDING  TENDERNESS IN MOUTH AND THROAT WITH OR WITHOUT PRESENCE OF ULCERS  *URINARY PROBLEMS  *BOWEL PROBLEMS  UNUSUAL RASH Items with * indicate a potential emergency and should be followed up as soon as possible.  Feel free to call the clinic should you have any questions or concerns. The clinic phone number is [phone].  Please show the [facility] at check-in to the Emergency Department and triage nurse.

## 2020-08-01 ENCOUNTER — Telehealth: Payer: Self-pay | Admitting: Medical Oncology

## 2020-08-01 ENCOUNTER — Other Ambulatory Visit: Payer: Self-pay | Admitting: Medical Oncology

## 2020-08-01 ENCOUNTER — Telehealth: Payer: Self-pay | Admitting: *Deleted

## 2020-08-01 MED ORDER — LOSARTAN POTASSIUM 50 MG PO TABS
ORAL_TABLET | ORAL | 1 refills | Status: DC
Start: 2020-08-01 — End: 2020-10-24

## 2020-08-01 NOTE — Addendum Note (Signed)
Addended by: Nathanial Millman E on: 08/01/2020 11:30 AM   Modules accepted: Orders

## 2020-08-01 NOTE — Telephone Encounter (Signed)
[facility] refill ? I lvm to call me back .  Pt last K+ was 4.9 on 07/25/20

## 2020-08-01 NOTE — Telephone Encounter (Signed)
Rx sent in

## 2020-08-01 NOTE — Telephone Encounter (Signed)
[facility] called requesting a refill on losartan 50mg .

## 2020-08-02 NOTE — Telephone Encounter (Signed)
LM that pt does not need K+ supplement due to normal K+ last week.

## 2020-08-10 ENCOUNTER — Ambulatory Visit (INDEPENDENT_AMBULATORY_CARE_PROVIDER_SITE_OTHER): Payer: Medicare Other

## 2020-08-10 DIAGNOSIS — I495 Sick sinus syndrome: Secondary | ICD-10-CM

## 2020-08-10 LAB — CUP PACEART REMOTE DEVICE CHECK
Battery Remaining Longevity: 18 mo
Battery Remaining Percentage: 32 %
Brady Statistic RA Percent Paced: 15 %
Brady Statistic RV Percent Paced: 31 %
Date Time Interrogation Session: 20211110022200
Implantable Lead Implant Date: 19990610
Implantable Lead Implant Date: 20160713
Implantable Lead Location: 753859
Implantable Lead Location: 753860
Implantable Lead Model: 4136
Implantable Lead Serial Number: 29813363
Implantable Pulse Generator Implant Date: 20150130
Lead Channel Impedance Value: 458 Ohm
Lead Channel Impedance Value: 594 Ohm
Lead Channel Pacing Threshold Amplitude: 0.8 V
Lead Channel Pacing Threshold Amplitude: 1.1 V
Lead Channel Pacing Threshold Pulse Width: 0.4 ms
Lead Channel Pacing Threshold Pulse Width: 0.5 ms
Lead Channel Setting Pacing Amplitude: 1.2 V
Lead Channel Setting Pacing Amplitude: 2 V
Lead Channel Setting Pacing Pulse Width: 0.4 ms
Lead Channel Setting Sensing Sensitivity: 2 mV
Pulse Gen Serial Number: 393477

## 2020-08-11 NOTE — Progress Notes (Signed)
Remote pacemaker transmission.   

## 2020-08-16 ENCOUNTER — Other Ambulatory Visit: Payer: Self-pay

## 2020-08-16 ENCOUNTER — Ambulatory Visit (INDEPENDENT_AMBULATORY_CARE_PROVIDER_SITE_OTHER): Payer: Medicare Other

## 2020-08-16 DIAGNOSIS — Z0000 Encounter for general adult medical examination without abnormal findings: Secondary | ICD-10-CM | POA: Diagnosis not present

## 2020-08-16 NOTE — Progress Notes (Signed)
Virtual Visit via Telephone Note  I connected with  [name] on 08/16/20 at  9:30 AM EST by telephone and verified that I am speaking with the correct person using two identifiers.  Medicare Annual Wellness visit completed telephonically due to Covid-19 pandemic.   Persons participating in this call: This Health Coach and this patient.   Location: Patient: Home Provider: Office   I discussed the limitations, risks, security and privacy concerns of performing an evaluation and management service by telephone and the availability of in person appointments. The patient expressed understanding and agreed to proceed.  Unable to perform video visit due to video visit attempted and failed and/or patient does not have video capability.   Some vital signs may be absent or patient reported.   Willette Brace, LPN    Subjective:   [name] is a 85 y.o. female who presents for an Initial Medicare Annual Wellness Visit.  Review of Systems     Cardiac Risk Factors include: advanced age (>23men, >85 women);hypertension;dyslipidemia;diabetes mellitus     Objective:    Today's Vitals   There is no height or weight on file to calculate BMI.  Advanced Directives 08/16/2020 07/25/2020 06/27/2020 06/27/2020 05/30/2020 05/30/2020 05/02/2020  Does Patient Have a Medical Advance Directive? No No No No No No No  Type of Advance Directive - - - - - - -  Copy of Healthcare Power of Attorney in Chart? - - - - - - -  Would patient like information on creating a medical advance directive? No - Patient declined No - Patient declined No - Patient declined No - Patient declined No - Patient declined No - Patient declined No - Patient declined    Current Medications (verified) Outpatient Encounter Medications as of 08/16/2020  Medication Sig  . acetaminophen (TYLENOL) 500 MG tablet Take 500 mg by mouth every 6 (six) hours as needed for headache.  . albuterol (VENTOLIN HFA) 108 (90 Base) MCG/ACT inhaler  TAKE 2 PUFFS BY MOUTH EVERY 6 HOURS AS NEEDED FOR WHEEZE OR SHORTNESS OF BREATH  . apixaban (ELIQUIS) 2.5 MG TABS tablet Take 1 tablet (2.5 mg total) by mouth 2 (two) times daily.  Marland Kitchen atorvastatin (LIPITOR) 10 MG tablet Take 1 tablet (10 mg total) by mouth daily. Please make yearly appt with Dr. Tamala Julian for November for future refills. Thank you 1st attempt  . carvedilol (COREG) 12.5 MG tablet Take 1 tablet (12.5 mg total) by mouth 2 (two) times daily with a meal.  . doxepin (SINEQUAN) 10 MG/ML solution Take 0.3 mLs (3 mg total) by mouth at bedtime.  . fenofibrate (TRICOR) 48 MG tablet TAKE 1 TABLET BY MOUTH EVERY DAY (Patient taking differently: Take 48 mg by mouth daily after lunch. )  . glucose blood (ONETOUCH VERIO) test strip Use to test 3-4 times daily.  Marland Kitchen levothyroxine (SYNTHROID) 112 MCG tablet TAKE 1 TABLET (112 MCG TOTAL) BY MOUTH DAILY BEFORE BREAKFAST.  Marland Kitchen Loperamide HCl (IMODIUM PO) Take by mouth as needed.  Marland Kitchen losartan (COZAAR) 50 MG tablet Take 2 tablets by mouth daily.  . nitroGLYCERIN (NITROSTAT) 0.4 MG SL tablet Place 1 tablet (0.4 mg total) under the tongue every 5 (five) minutes x 3 doses as needed for chest pain (Max 3 doses within 15 min. Call 911).  . pantoprazole (PROTONIX) 40 MG tablet TAKE 1 TABLET BY MOUTH EVERY DAY  . potassium chloride SA (KLOR-CON) 20 MEQ tablet Take 1 tablet (20 mEq total) by mouth daily.  . diclofenac sodium (  VOLTAREN) 1 % GEL Apply 4 g topically 4 (four) times daily. (Patient not taking: Reported on 08/16/2020)  . lidocaine-prilocaine (EMLA) cream Apply 1 application topically as needed. (Patient not taking: Reported on 08/16/2020)  . linagliptin (TRADJENTA) 5 MG TABS tablet TAKE 1 TABLET (5 MG TOTAL) DAILY BY MOUTH. (Patient not taking: Reported on 08/16/2020)  . oxyCODONE-acetaminophen (PERCOCET/ROXICET) 5-325 MG tablet Take 1 tablet by mouth every 8 (eight) hours as needed for severe pain. (Patient not taking: Reported on 08/16/2020)  .  prochlorperazine (COMPAZINE) 10 MG tablet Take 1 tablet (10 mg total) by mouth every 6 (six) hours as needed for nausea or vomiting. (Patient not taking: Reported on 08/16/2020)   No facility-administered encounter medications on file as of 08/16/2020.    Allergies (verified) Seasonal ic [cholestatin]   History: Past Medical History:  Diagnosis Date  . CAD (coronary artery disease), native coronary artery    PTCA of distal right coronary artery 1997 Cypher stents to circumflex 2005 Cardiac cath in 2011 with patent stent to circumflex and moderate disease elsewhere treated medically   . Cancer ([place])   . Cardiac pacemaker in situ 12/24/2015   Original implant reportedly in 1991 for tachybradycardia syndrome, generator change in 1999, 2004 and evidently again in 2016 in [place]   . COPD (chronic obstructive pulmonary disease) ([place])   . Diabetes mellitus without complication ([place])   . Hypertension   . Hypothyroidism 03/23/2010  . Pacemaker   . Paroxysmal atrial fibrillation ([place]) 03/23/2010   CHA2DS2VASC score 5    Past Surgical History:  Procedure Laterality Date  . BIOPSY  01/08/2020   Procedure: BIOPSY;  Surgeon: Garner Nash, DO;  Location: [facility];  Service: Pulmonary;;  . BRONCHIAL BRUSHINGS  01/08/2020   Procedure: BRONCHIAL BRUSHINGS;  Surgeon: Garner Nash, DO;  Location: [facility];  Service: Pulmonary;;  . BRONCHIAL WASHINGS  01/08/2020   Procedure: BRONCHIAL WASHINGS;  Surgeon: Garner Nash, DO;  Location: [facility];  Service: Pulmonary;;  . CARDIAC CATHETERIZATION N/A 12/26/2015   Procedure: Left Heart Cath and Coronary Angiography;  Surgeon: Peter M [name], MD;  Location: [facility];  Service: Cardiovascular;  Laterality: N/A;  . CARDIOVERSION  05/2015  . CHOLECYSTECTOMY  2012  . CORONARY ANGIOPLASTY WITH STENT PLACEMENT  1990  . ENDOBRONCHIAL ULTRASOUND  01/08/2020   Procedure: ENDOBRONCHIAL ULTRASOUND;  Surgeon: Garner Nash, DO;  Location:  [facility];  Service: Pulmonary;;  . FINE NEEDLE ASPIRATION BIOPSY  01/08/2020   Procedure: FINE NEEDLE ASPIRATION BIOPSY;  Surgeon: Garner Nash, DO;  Location: [facility];  Service: Pulmonary;;  . HERNIA REPAIR    . IR IMAGING GUIDED PORT INSERTION  02/18/2020  . PACEMAKER GENERATOR CHANGE  [id number], 2016  . PACEMAKER INSERTION  1991  . VIDEO BRONCHOSCOPY WITH ENDOBRONCHIAL ULTRASOUND N/A 01/08/2020   Procedure: VIDEO BRONCHOSCOPY;  Surgeon: Garner Nash, DO;  Location: [facility];  Service: Pulmonary;  Laterality: N/A;   Family History  Problem Relation Age of Onset  . Heart disease Father   . Heart disease Mother   . Heart attack Mother   . Cirrhosis Brother   . Colon cancer Neg Hx   . Stomach cancer Neg Hx    Social History   Socioeconomic History  . Marital status: Widowed    Spouse name: Not on file  . Number of children: Not on file  . Years of education: Not on file  . Highest education level: Not on file  Occupational  History  . Occupation: retired  Tobacco Use  . Smoking status: Former Smoker    Packs/day: 0.75    Years: 66.00    Pack years: 49.50    Quit date: 12/27/2019    Years since quitting: 0.6  . Smokeless tobacco: Never Used  Vaping Use  . Vaping Use: Never used  Substance and Sexual Activity  . Alcohol use: No    Alcohol/week: 0.0 standard drinks  . Drug use: No  . Sexual activity: Not on file  Other Topics Concern  . Not on file  Social History Narrative  . Not on file   Social Determinants of Health   Financial Resource Strain: Low Risk   . Difficulty of Paying Living Expenses: Not hard at all  Food Insecurity: No Food Insecurity  . Worried About Charity fundraiser in the Last Year: Never true  . Ran Out of Food in the Last Year: Never true  Transportation Needs: No Transportation Needs  . Lack of Transportation (Medical): No  . Lack of Transportation (Non-Medical): No  Physical Activity: Inactive  . Days of Exercise per  Week: 0 days  . Minutes of Exercise per Session: 0 min  Stress: No Stress Concern Present  . Feeling of Stress : Not at all  Social Connections: Socially Isolated  . Frequency of Communication with Friends and Family: More than three times a week  . Frequency of Social Gatherings with Friends and Family: Never  . Attends Religious Services: Never  . Active Member of Clubs or Organizations: No  . Attends Archivist Meetings: Never  . Marital Status: Widowed    Tobacco Counseling Counseling given: Not Answered   Clinical Intake:  Pre-visit preparation completed: Yes  Pain : 0-10 (8) Pain Score:  (back and shoulder blades) Pain Location: Back Pain Orientation: Mid Pain Descriptors / Indicators: Aching Pain Onset: More than a month ago Pain Frequency: Constant     BMI - recorded: 19.68 Nutritional Status: BMI of 19-24  Normal Nutritional Risks: Nausea/ vomitting/ diarrhea (loose stool) Diabetes: Yes CBG done?: No Did pt. bring in CBG monitor from home?: No  How often do you need to have someone help you when you read instructions, pamphlets, or other written materials from your doctor or pharmacy?: 1 - Never  Diabetic?Nutrition Risk Assessment:  Has the patient had any N/V/D within the last 2 months?  Yes  Loose stools  Does the patient have any non-healing wounds?  No  Has the patient had any unintentional weight loss or weight gain?  No   Diabetes:  Is the patient diabetic?  Yes  If diabetic, was a CBG obtained today?  No  Did the patient bring in their glucometer from home?  No  How often do you monitor your CBG's? Every other Day    Financial Strains and Diabetes Management:  Are you having any financial strains with the device, your supplies or your medication? No .  Does the patient want to be seen by Chronic Care Management for management of their diabetes?  No  Would the patient like to be referred to a Nutritionist or for Diabetic Management?   No   Diabetic Exams:  Diabetic Eye Exam: Overdue for diabetic eye exam. Pt has been advised about the importance in completing this exam. Patient advised to call and schedule an eye exam. Diabetic Foot Exam: Completed 04/27/20   Interpreter Needed?: No  Information entered by :: Charlott Rakes, LPN   Activities of Daily  Living In your present state of health, do you have any difficulty performing the following activities: 08/16/2020  Hearing? N  Vision? N  Difficulty concentrating or making decisions? Y  Comment making decision based on how information given  Walking or climbing stairs? N  Comment as long as not too many  Dressing or bathing? N  Doing errands, shopping? N  Preparing Food and eating ? N  Using the Toilet? N  In the past six months, have you accidently leaked urine? N  Comment depend panties on in case  Do you have problems with loss of bowel control? Y  Comment loose stool  Managing your Medications? Y  Comment daughter assist  Managing your Finances? N  Housekeeping or managing your Housekeeping? N  Some recent data might be hidden    Patient Care Team: [name], [name], MD as PCP - General (Family Medicine) Constance Haw, MD as PCP - Electrophysiology (Cardiology) [name], MD as PCP - Cardiology (Cardiology) Germaine Pomfret, [facility] as Pharmacist (Pharmacist)  Indicate any recent Medical Services you may have received from other than [facility] providers in the past year (date may be approximate).     Assessment:   This is a routine wellness examination for Joely.  Hearing/Vision screen  Hearing Screening   125Hz  250Hz  500Hz  1000Hz  2000Hz  3000Hz  4000Hz  6000Hz  8000Hz   Right ear:           Left ear:           Comments: Pt denies any hearing issues at this time  Vision Screening Comments: Pt has not followed up with eye Dr As of yet, states she will find one to follow up  Dietary issues and exercise activities discussed: Current Exercise  Habits: The patient does not participate in regular exercise at present, Exercise limited by: respiratory conditions(s);orthopedic condition(s)  Goals    . Chronic Care Management     CARE PLAN ENTRY (see longitudinal plan of care for additional care plan information)  Current Barriers:  . Chronic Disease Management support, education, and care coordination needs related to Hypertension, Hyperlipidemia, Diabetes, Atrial Fibrillation, Coronary Artery Disease, GERD, COPD, Hypothyroidism and Chronic Diarrhea, and Lung Cancer   Hypertension BP Readings from Last 3 Encounters:  06/27/20 (!) 151/49  05/30/20 (!) 101/45  05/26/20 (!) 118/50   . Pharmacist Clinical Goal(s): o Over the next 90 days, patient will work with PharmD and providers to maintain BP goal <140/90 . Current regimen:  . Carvedilol 12.5 mg twice daily  . Losartan 50 mg 2 tablets daily . Interventions: o Discussed low salt diet and exercising as tolerated extensively o Will initiate blood pressure monitoring plan  . Patient self care activities - Over the next 90 days, patient will: o Check Blood Pressure weekly, document, and provide at future appointments o Ensure daily salt intake < 2300 mg/day  Hyperlipidemia Lab Results  Component Value Date/Time   LDLCALC 76 06/10/2019 09:29 AM   . Pharmacist Clinical Goal(s): o Over the next 90 days, patient will work with PharmD and providers to achieve LDL goal < 70 . Current regimen:  . Atorvastatin 10 mg daily  . Fenofibrate 48 mg daily . Interventions: o Discussed low cholesterol diet and exercising as tolerated extensively o Will initiate cholesterol monitoring plan   Diabetes Lab Results  Component Value Date/Time   HGBA1C 5.4 04/27/2020 02:35 PM   HGBA1C 6.5 09/30/2019 08:26 AM   HGBA1C 6.2 06/10/2019 09:29 AM   . Pharmacist Clinical Goal(s):  o Over the next 90 days, patient will work with PharmD and providers to maintain A1c goal <7% . Current regimen:   o Tradjenta 5 mg daily  . Interventions: o Discussed carbohydrate counting and exercising as tolerated extensively o Will initiate blood sugar monitoring plan  . Patient self care activities - Over the next 90 days, patient will: o Check blood sugar in the morning before eating or drinking, document, and provide at future appointments o Contact provider with any episodes of hypoglycemia  Medication management . Pharmacist Clinical Goal(s): o Over the next 90 days, patient will work with PharmD and providers to achieve optimal medication adherence . Current pharmacy: [facility] . Interventions o Comprehensive medication review performed. o Utilize [facility] for medication synchronization, packaging and delivery o Verbal consent obtained for [facility] enhanced pharmacy services (medication synchronization, adherence packaging, delivery coordination). A medication sync plan was created to allow patient to get all medications delivered once every 30 to 90 days per patient preference. Patient understands they have freedom to choose pharmacy and clinical pharmacist will coordinate care between all prescribers and [facility]. . Patient self care activities - Over the next 90 days, patient will: o Take medications as prescribed o Report any questions or concerns to PharmD and/or provider(s)    . Patient Stated     None at this time      Depression Screen PHQ 2/9 Scores 08/16/2020 10/02/2019 10/09/2017  PHQ - 2 Score 0 0 0    Fall Risk Fall Risk  08/16/2020 04/30/2020  Falls in the past year? 0 0  Number falls in past yr: 0 0  Injury with Fall? 0 0  Risk for fall due to : Impaired vision;Impaired balance/gait;Impaired mobility -  Follow up Falls prevention discussed Education provided    Any stairs in or around the home? No  If so, are there any without handrails? No  Home free of loose throw rugs in walkways, pet beds, electrical cords, etc? Yes  Adequate lighting in  your home to reduce risk of falls? Yes   ASSISTIVE DEVICES UTILIZED TO PREVENT FALLS:  Life alert? No  Use of a cane, walker or w/c? Yes  Grab bars in the bathroom? No  Shower chair or bench in shower? Yes  Elevated toilet seat or a handicapped toilet? No   TIMED UP AND GO:  Was the test performed? No .      Cognitive Function:6CIT: Declined        Immunizations Immunization History  Administered Date(s) Administered  . Influenza, High Dose Seasonal PF 07/31/2017, 07/24/2018  . Influenza-Unspecified 07/31/2017  . Pneumococcal Polysaccharide-23 04/27/2020    TDAP status: Due, Education has been provided regarding the importance of this vaccine. Advised may receive this vaccine at local pharmacy or Health Dept. Aware to provide a copy of the vaccination record if obtained from local pharmacy or Health Dept. Verbalized acceptance and understanding. Flu Vaccine status: Declined, Education has been provided regarding the importance of this vaccine but patient still declined. Advised may receive this vaccine at local pharmacy or Health Dept. Aware to provide a copy of the vaccination record if obtained from local pharmacy or Health Dept. Verbalized acceptance and understanding. Pneumococcal vaccine status: Up to date Covid-19 vaccine status: Declined, Education has been provided regarding the importance of this vaccine but patient still declined. Advised may receive this vaccine at local pharmacy or Health Dept.or vaccine clinic. Aware to provide a copy of the vaccination record if obtained from local  pharmacy or Health Dept. Verbalized acceptance and understanding.  Qualifies for Shingles Vaccine? Yes   Zostavax completed No   Shingrix Completed?: No.    Education has been provided regarding the importance of this vaccine. Patient has been advised to call insurance company to determine out of pocket expense if they have not yet received this vaccine. Advised may also receive vaccine at  local pharmacy or Health Dept. Verbalized acceptance and understanding.  Screening Tests Health Maintenance  Topic Date Due  . INFLUENZA VACCINE  09/30/2020 (Originally 05/01/2020)  . OPHTHALMOLOGY EXAM  09/30/2020 (Originally 06/22/1945)  . DEXA SCAN  11/01/2020 (Originally 06/21/2002)  . HEMOGLOBIN A1C  10/28/2020  . FOOT EXAM  04/27/2021  . PNA vac Low Risk Adult (2 of 2 - PCV13) 04/27/2021  . TETANUS/TDAP  Discontinued  . COVID-19 Vaccine  Discontinued    Health Maintenance  There are no preventive care reminders to display for this patient.  Colorectal cancer screening: No longer required.  Mammogram status: No longer required.    Bone density postponed until 11/01/20   Additional Screening:  Vision Screening: Recommended annual ophthalmology exams for early detection of glaucoma and other disorders of the eye. Is the patient up to date with their annual eye exam?  No  Who is the provider or what is the name of the office in which the patient attends annual eye exams? Pt Daughter has suggested a eye provider to call this week   Dental Screening: Recommended annual dental exams for proper oral hygiene  Community Resource Referral / Chronic Care Management: CRR required this visit?  No   CCM required this visit?  No      Plan:     I have personally reviewed and noted the following in the patient's chart:   . Medical and social history . Use of alcohol, tobacco or illicit drugs  . Current medications and supplements . Functional ability and status . Nutritional status . Physical activity . Advanced directives . List of other physicians . Hospitalizations, surgeries, and ER visits in previous 12 months . Vitals . Screenings to include cognitive, depression, and falls . Referrals and appointments  In addition, I have reviewed and discussed with patient certain preventive protocols, quality metrics, and best practice recommendations. A written personalized care  plan for preventive services as well as general preventive health recommendations were provided to patient.     Willette Brace, LPN   [id number]   Nurse Notes: None

## 2020-08-16 NOTE — Patient Instructions (Addendum)
[name] , Thank you for taking time to come for your Medicare Wellness Visit. I appreciate your ongoing commitment to your health goals. Please review the following plan we discussed and let me know if I can assist you in the future.   Screening recommendations/referrals: Colonoscopy: No longer required Mammogram: No longer required Bone Density: Postponed 11/01/20 Recommended yearly ophthalmology/optometry visit for glaucoma screening and checkup Recommended yearly dental visit for hygiene and checkup  Vaccinations: Influenza vaccine: Due and discussed  Pneumococcal vaccine: Up to date Tdap vaccine: Declined  Shingles vaccine: Shingrix discussed. Please contact your pharmacy for coverage information.    Covid-19:Discussed and Declined  Advanced directives: Advance directive discussed with you today. Even though you declined this today please call our office should you change your mind and we can give you the proper paperwork for you to fill out.  Conditions/risks identified: None at this time  Next appointment: Follow up in one year for your annual wellness visit    Preventive Care 65 Years and Older, Female Preventive care refers to lifestyle choices and visits with your health care provider that can promote health and wellness. What does preventive care include?  A yearly physical exam. This is also called an annual well check.  Dental exams once or twice a year.  Routine eye exams. Ask your health care provider how often you should have your eyes checked.  Personal lifestyle choices, including:  Daily care of your teeth and gums.  Regular physical activity.  Eating a healthy diet.  Avoiding tobacco and drug use.  Limiting alcohol use.  Practicing safe sex.  Taking low-dose aspirin every day.  Taking vitamin and mineral supplements as recommended by your health care provider. What happens during an annual well check? The services and screenings done by your  health care provider during your annual well check will depend on your age, overall health, lifestyle risk factors, and family history of disease. Counseling  Your health care provider may ask you questions about your:  Alcohol use.  Tobacco use.  Drug use.  Emotional well-being.  Home and relationship well-being.  Sexual activity.  Eating habits.  History of falls.  Memory and ability to understand (cognition).  Work and work Statistician.  Reproductive health. Screening  You may have the following tests or measurements:  Height, weight, and BMI.  Blood pressure.  Lipid and cholesterol levels. These may be checked every 5 years, or more frequently if you are over 46 years old.  Skin check.  Lung cancer screening. You may have this screening every year starting at age 70 if you have a 30-pack-year history of smoking and currently smoke or have quit within the past 15 years.  Fecal occult blood test (FOBT) of the stool. You may have this test every year starting at age 30.  Flexible sigmoidoscopy or colonoscopy. You may have a sigmoidoscopy every 5 years or a colonoscopy every 10 years starting at age 56.  Hepatitis C blood test.  Hepatitis B blood test.  Sexually transmitted disease (STD) testing.  Diabetes screening. This is done by checking your blood sugar (glucose) after you have not eaten for a while (fasting). You may have this done every 1-3 years.  Bone density scan. This is done to screen for osteoporosis. You may have this done starting at age 70.  Mammogram. This may be done every 1-2 years. Talk to your health care provider about how often you should have regular mammograms. Talk with your health care provider  about your test results, treatment options, and if necessary, the need for more tests. Vaccines  Your health care provider may recommend certain vaccines, such as:  Influenza vaccine. This is recommended every year.  Tetanus, diphtheria, and  acellular pertussis (Tdap, Td) vaccine. You may need a Td booster every 10 years.  Zoster vaccine. You may need this after age 48.  Pneumococcal 13-valent conjugate (PCV13) vaccine. One dose is recommended after age 69.  Pneumococcal polysaccharide (PPSV23) vaccine. One dose is recommended after age 36. Talk to your health care provider about which screenings and vaccines you need and how often you need them. This information is not intended to replace advice given to you by your health care provider. Make sure you discuss any questions you have with your health care provider. Document Released: 10/14/2015 Document Revised: 06/06/2016 Document Reviewed: 07/19/2015 Elsevier Interactive Patient Education  2017 [place] Prevention in the Home Falls can cause injuries. They can happen to people of all ages. There are many things you can do to make your home safe and to help prevent falls. What can I do on the outside of my home?  Regularly fix the edges of walkways and driveways and fix any cracks.  Remove anything that might make you trip as you walk through a door, such as a raised step or threshold.  Trim any bushes or trees on the path to your home.  Use bright outdoor lighting.  Clear any walking paths of anything that might make someone trip, such as rocks or tools.  Regularly check to see if handrails are loose or broken. Make sure that both sides of any steps have handrails.  Any raised decks and porches should have guardrails on the edges.  Have any leaves, snow, or ice cleared regularly.  Use sand or salt on walking paths during winter.  Clean up any spills in your garage right away. This includes oil or grease spills. What can I do in the bathroom?  Use night lights.  Install grab bars by the toilet and in the tub and shower. Do not use towel bars as grab bars.  Use non-skid mats or decals in the tub or shower.  If you need to sit down in the shower, use  a plastic, non-slip stool.  Keep the floor dry. Clean up any water that spills on the floor as soon as it happens.  Remove soap buildup in the tub or shower regularly.  Attach bath mats securely with double-sided non-slip rug tape.  Do not have throw rugs and other things on the floor that can make you trip. What can I do in the bedroom?  Use night lights.  Make sure that you have a light by your bed that is easy to reach.  Do not use any sheets or blankets that are too big for your bed. They should not hang down onto the floor.  Have a firm chair that has side arms. You can use this for support while you get dressed.  Do not have throw rugs and other things on the floor that can make you trip. What can I do in the kitchen?  Clean up any spills right away.  Avoid walking on wet floors.  Keep items that you use a lot in easy-to-reach places.  If you need to reach something above you, use a strong step stool that has a grab bar.  Keep electrical cords out of the way.  Do not use floor polish or  wax that makes floors slippery. If you must use wax, use non-skid floor wax.  Do not have throw rugs and other things on the floor that can make you trip. What can I do with my stairs?  Do not leave any items on the stairs.  Make sure that there are handrails on both sides of the stairs and use them. Fix handrails that are broken or loose. Make sure that handrails are as long as the stairways.  Check any carpeting to make sure that it is firmly attached to the stairs. Fix any carpet that is loose or worn.  Avoid having throw rugs at the top or bottom of the stairs. If you do have throw rugs, attach them to the floor with carpet tape.  Make sure that you have a light switch at the top of the stairs and the bottom of the stairs. If you do not have them, ask someone to add them for you. What else can I do to help prevent falls?  Wear shoes that:  Do not have high heels.  Have  rubber bottoms.  Are comfortable and fit you well.  Are closed at the toe. Do not wear sandals.  If you use a stepladder:  Make sure that it is fully opened. Do not climb a closed stepladder.  Make sure that both sides of the stepladder are locked into place.  Ask someone to hold it for you, if possible.  Clearly mark and make sure that you can see:  Any grab bars or handrails.  First and last steps.  Where the edge of each step is.  Use tools that help you move around (mobility aids) if they are needed. These include:  Canes.  Walkers.  Scooters.  Crutches.  Turn on the lights when you go into a dark area. Replace any light bulbs as soon as they burn out.  Set up your furniture so you have a clear path. Avoid moving your furniture around.  If any of your floors are uneven, fix them.  If there are any pets around you, be aware of where they are.  Review your medicines with your doctor. Some medicines can make you feel dizzy. This can increase your chance of falling. Ask your doctor what other things that you can do to help prevent falls. This information is not intended to replace advice given to you by your health care provider. Make sure you discuss any questions you have with your health care provider. Document Released: 07/14/2009 Document Revised: 02/23/2016 Document Reviewed: 10/22/2014 Elsevier Interactive Patient Education  2017 [employer].

## 2020-08-19 DIAGNOSIS — E119 Type 2 diabetes mellitus without complications: Secondary | ICD-10-CM | POA: Diagnosis not present

## 2020-08-19 DIAGNOSIS — H40033 Anatomical narrow angle, bilateral: Secondary | ICD-10-CM | POA: Diagnosis not present

## 2020-08-22 ENCOUNTER — Inpatient Hospital Stay: Payer: Medicare Other

## 2020-08-22 ENCOUNTER — Inpatient Hospital Stay: Payer: Medicare Other | Attending: Internal Medicine

## 2020-08-22 ENCOUNTER — Encounter: Payer: Self-pay | Admitting: Internal Medicine

## 2020-08-22 ENCOUNTER — Inpatient Hospital Stay (HOSPITAL_BASED_OUTPATIENT_CLINIC_OR_DEPARTMENT_OTHER): Payer: Medicare Other | Admitting: Internal Medicine

## 2020-08-22 ENCOUNTER — Other Ambulatory Visit: Payer: Self-pay

## 2020-08-22 ENCOUNTER — Inpatient Hospital Stay: Payer: Medicare Other | Admitting: Nutrition

## 2020-08-22 VITALS — BP 127/44 | HR 78 | Temp 98.6°F | Resp 17 | Ht 62.0 in | Wt 107.1 lb

## 2020-08-22 DIAGNOSIS — C3491 Malignant neoplasm of unspecified part of right bronchus or lung: Secondary | ICD-10-CM

## 2020-08-22 DIAGNOSIS — Z5112 Encounter for antineoplastic immunotherapy: Secondary | ICD-10-CM

## 2020-08-22 DIAGNOSIS — C7951 Secondary malignant neoplasm of bone: Secondary | ICD-10-CM | POA: Diagnosis not present

## 2020-08-22 DIAGNOSIS — Z95828 Presence of other vascular implants and grafts: Secondary | ICD-10-CM

## 2020-08-22 DIAGNOSIS — C3490 Malignant neoplasm of unspecified part of unspecified bronchus or lung: Secondary | ICD-10-CM | POA: Diagnosis not present

## 2020-08-22 DIAGNOSIS — E039 Hypothyroidism, unspecified: Secondary | ICD-10-CM | POA: Diagnosis not present

## 2020-08-22 DIAGNOSIS — C3411 Malignant neoplasm of upper lobe, right bronchus or lung: Secondary | ICD-10-CM | POA: Insufficient documentation

## 2020-08-22 LAB — CMP (CANCER CENTER ONLY)
ALT: 20 U/L (ref 0–44)
AST: 20 U/L (ref 15–41)
Albumin: 3.5 g/dL (ref 3.5–5.0)
Alkaline Phosphatase: 68 U/L (ref 38–126)
Anion gap: 6 (ref 5–15)
BUN: 20 mg/dL (ref 8–23)
CO2: 22 mmol/L (ref 22–32)
Calcium: 9.1 mg/dL (ref 8.9–10.3)
Chloride: 112 mmol/L — ABNORMAL HIGH (ref 98–111)
Creatinine: 1.12 mg/dL — ABNORMAL HIGH (ref 0.44–1.00)
GFR, Estimated: 49 mL/min — ABNORMAL LOW (ref >60)
Glucose, Bld: 132 mg/dL — ABNORMAL HIGH (ref 70–99)
Potassium: 4.1 mmol/L (ref 3.5–5.1)
Sodium: 140 mmol/L (ref 135–145)
Total Bilirubin: 0.3 mg/dL (ref 0.3–1.2)
Total Protein: 6.3 g/dL — ABNORMAL LOW (ref 6.5–8.1)

## 2020-08-22 LAB — CBC WITH DIFFERENTIAL (CANCER CENTER ONLY)
Abs Immature Granulocytes: 0.01 10*3/uL (ref 0.00–0.07)
Basophils Absolute: 0 10*3/uL (ref 0.0–0.1)
Basophils Relative: 1 %
Eosinophils Absolute: 0.1 10*3/uL (ref 0.0–0.5)
Eosinophils Relative: 2 %
HCT: 34.1 % — ABNORMAL LOW (ref 36.0–46.0)
Hemoglobin: 10.8 g/dL — ABNORMAL LOW (ref 12.0–15.0)
Immature Granulocytes: 0 %
Lymphocytes Relative: 34 %
Lymphs Abs: 1.6 10*3/uL (ref 0.7–4.0)
MCH: 30.3 pg (ref 26.0–34.0)
MCHC: 31.7 g/dL (ref 30.0–36.0)
MCV: 95.5 fL (ref 80.0–100.0)
Monocytes Absolute: 0.6 10*3/uL (ref 0.1–1.0)
Monocytes Relative: 13 %
Neutro Abs: 2.4 10*3/uL (ref 1.7–7.7)
Neutrophils Relative %: 50 %
Platelet Count: 122 10*3/uL — ABNORMAL LOW (ref 150–400)
RBC: 3.57 MIL/uL — ABNORMAL LOW (ref 3.87–5.11)
RDW: 13.2 % (ref 11.5–15.5)
WBC Count: 4.8 10*3/uL (ref 4.0–10.5)
nRBC: 0 % (ref 0.0–0.2)

## 2020-08-22 LAB — TSH: TSH: 0.667 u[IU]/mL (ref 0.308–3.960)

## 2020-08-22 MED ORDER — SODIUM CHLORIDE 0.9 % IV SOLN
Freq: Once | INTRAVENOUS | Status: AC
  Filled 2020-08-22: qty 250

## 2020-08-22 MED ORDER — SODIUM CHLORIDE 0.9 % IV SOLN
1500.0000 mg | Freq: Once | INTRAVENOUS | Status: AC
  Administered 2020-08-22: 1500 mg via INTRAVENOUS
  Filled 2020-08-22: qty 30

## 2020-08-22 MED ORDER — SODIUM CHLORIDE 0.9% FLUSH
10.0000 mL | Freq: Once | INTRAVENOUS | Status: AC
  Administered 2020-08-22: 10 mL
  Filled 2020-08-22: qty 10

## 2020-08-22 NOTE — Progress Notes (Signed)
[facility] [phone]   [phone]  OFFICE PROGRESS NOTE  [name], [name], MD [address] [place] [address]  DIAGNOSIS: Extensive stage (T2 a, N2, M1 C) small cell lung cancer presented with central right upper lobe lung mass in addition to left lower lobe pulmonary metastasis and bilateral hilar, subcarinal and bilateral paratracheal and left prevascular lymphadenopathy in addition to bone metastasis in the left iliac wing diagnosed in April 2021.  PRIOR THERAPY: None  CURRENT THERAPY: Systemic chemotherapy with carboplatin for AUC of 5 on day 1, etoposide 100 mg/M2 on days 1, 2 and 3 with Neulasta support in addition to Imfinzi 1500 mg IV every 3 weeks during chemotherapy followed by maintenance Imfinzi 1500 mg IV every 4 weeks after cycle #4.  Status post 8 cycles.  INTERVAL HISTORY: [name] 85 y.o. female returns to the clinic today for follow-up visit accompanied by her daughter.  The patient is feeling fine today with no concerning complaints except for intermittent abdominal pain especially in the morning.  She has a lot of gas.  She denied having any current nausea, vomiting, diarrhea or constipation.  She denied having any chest pain, shortness of breath, cough or hemoptysis.  She has no headache or visual changes.  She is here today for evaluation before starting cycle #9 of her treatment.  MEDICAL HISTORY: Past Medical History:  Diagnosis Date  . CAD (coronary artery disease), native coronary artery    PTCA of distal right coronary artery 1997 Cypher stents to circumflex 2005 Cardiac cath in 2011 with patent stent to circumflex and moderate disease elsewhere treated medically   . Cancer ([place])   . Cardiac pacemaker in situ 12/24/2015   Original implant reportedly in 1991 for tachybradycardia syndrome, generator change in 1999, 2004 and evidently again in 2016 in [place]   . COPD (chronic obstructive pulmonary disease)  ([place])   . Diabetes mellitus without complication ([place])   . Hypertension   . Hypothyroidism 03/23/2010  . Pacemaker   . Paroxysmal atrial fibrillation ([place]) 03/23/2010   CHA2DS2VASC score 5     ALLERGIES:  is allergic to seasonal ic [cholestatin].  MEDICATIONS:  Current Outpatient Medications  Medication Sig Dispense Refill  . acetaminophen (TYLENOL) 500 MG tablet Take 500 mg by mouth every 6 (six) hours as needed for headache.    . albuterol (VENTOLIN HFA) 108 (90 Base) MCG/ACT inhaler TAKE 2 PUFFS BY MOUTH EVERY 6 HOURS AS NEEDED FOR WHEEZE OR SHORTNESS OF BREATH 1 each 1  . apixaban (ELIQUIS) 2.5 MG TABS tablet Take 1 tablet (2.5 mg total) by mouth 2 (two) times daily. 60 tablet 5  . atorvastatin (LIPITOR) 10 MG tablet Take 1 tablet (10 mg total) by mouth daily. Please make yearly appt with Dr. Tamala Julian for November for future refills. Thank you 1st attempt 90 tablet 0  . carvedilol (COREG) 12.5 MG tablet Take 1 tablet (12.5 mg total) by mouth 2 (two) times daily with a meal. 180 tablet 2  . diclofenac sodium (VOLTAREN) 1 % GEL Apply 4 g topically 4 (four) times daily. (Patient not taking: Reported on 08/16/2020) 4 Tube 3  . doxepin (SINEQUAN) 10 MG/ML solution Take 0.3 mLs (3 mg total) by mouth at bedtime. 30 mL 1  . fenofibrate (TRICOR) 48 MG tablet TAKE 1 TABLET BY MOUTH EVERY DAY (Patient taking differently: Take 48 mg by mouth daily after lunch. ) 90 tablet 3  . glucose blood (ONETOUCH  VERIO) test strip Use to test 3-4 times daily. 300 strip 4  . levothyroxine (SYNTHROID) 112 MCG tablet TAKE 1 TABLET (112 MCG TOTAL) BY MOUTH DAILY BEFORE BREAKFAST. 90 tablet 2  . lidocaine-prilocaine (EMLA) cream Apply 1 application topically as needed. (Patient not taking: Reported on 08/16/2020) 30 g 2  . linagliptin (TRADJENTA) 5 MG TABS tablet TAKE 1 TABLET (5 MG TOTAL) DAILY BY MOUTH. (Patient not taking: Reported on 08/16/2020) 90 tablet 1  . Loperamide HCl (IMODIUM PO) Take by mouth as needed.     Marland Kitchen losartan (COZAAR) 50 MG tablet Take 2 tablets by mouth daily. 180 tablet 1  . nitroGLYCERIN (NITROSTAT) 0.4 MG SL tablet Place 1 tablet (0.4 mg total) under the tongue every 5 (five) minutes x 3 doses as needed for chest pain (Max 3 doses within 15 min. Call 911). 10 tablet 0  . oxyCODONE-acetaminophen (PERCOCET/ROXICET) 5-325 MG tablet Take 1 tablet by mouth every 8 (eight) hours as needed for severe pain. (Patient not taking: Reported on 08/16/2020) 20 tablet 0  . pantoprazole (PROTONIX) 40 MG tablet TAKE 1 TABLET BY MOUTH EVERY DAY 90 tablet 1  . potassium chloride SA (KLOR-CON) 20 MEQ tablet Take 1 tablet (20 mEq total) by mouth daily. 7 tablet 0  . prochlorperazine (COMPAZINE) 10 MG tablet Take 1 tablet (10 mg total) by mouth every 6 (six) hours as needed for nausea or vomiting. (Patient not taking: Reported on 08/16/2020) 30 tablet 1   No current facility-administered medications for this visit.    SURGICAL HISTORY:  Past Surgical History:  Procedure Laterality Date  . BIOPSY  01/08/2020   Procedure: BIOPSY;  Surgeon: Garner Nash, DO;  Location: [facility];  Service: Pulmonary;;  . BRONCHIAL BRUSHINGS  01/08/2020   Procedure: BRONCHIAL BRUSHINGS;  Surgeon: Garner Nash, DO;  Location: [facility];  Service: Pulmonary;;  . BRONCHIAL WASHINGS  01/08/2020   Procedure: BRONCHIAL WASHINGS;  Surgeon: Garner Nash, DO;  Location: [facility];  Service: Pulmonary;;  . CARDIAC CATHETERIZATION N/A 12/26/2015   Procedure: Left Heart Cath and Coronary Angiography;  Surgeon: Peter M [name], MD;  Location: [facility];  Service: Cardiovascular;  Laterality: N/A;  . CARDIOVERSION  05/2015  . CHOLECYSTECTOMY  2012  . CORONARY ANGIOPLASTY WITH STENT PLACEMENT  1990  . ENDOBRONCHIAL ULTRASOUND  01/08/2020   Procedure: ENDOBRONCHIAL ULTRASOUND;  Surgeon: Garner Nash, DO;  Location: [facility];  Service: Pulmonary;;  . FINE NEEDLE ASPIRATION BIOPSY  01/08/2020   Procedure: FINE  NEEDLE ASPIRATION BIOPSY;  Surgeon: Garner Nash, DO;  Location: [facility];  Service: Pulmonary;;  . HERNIA REPAIR    . IR IMAGING GUIDED PORT INSERTION  02/18/2020  . PACEMAKER GENERATOR CHANGE  [id number], 2016  . PACEMAKER INSERTION  1991  . VIDEO BRONCHOSCOPY WITH ENDOBRONCHIAL ULTRASOUND N/A 01/08/2020   Procedure: VIDEO BRONCHOSCOPY;  Surgeon: Garner Nash, DO;  Location: [facility];  Service: Pulmonary;  Laterality: N/A;    REVIEW OF SYSTEMS:  A comprehensive review of systems was negative except for: Constitutional: positive for fatigue Gastrointestinal: positive for abdominal pain   PHYSICAL EXAMINATION: General appearance: alert, cooperative, appears stated age, fatigued and no distress Head: Normocephalic, without obvious abnormality, atraumatic Neck: no adenopathy, no JVD, supple, symmetrical, trachea midline and thyroid not enlarged, symmetric, no tenderness/mass/nodules Lymph nodes: Cervical, supraclavicular, and axillary nodes normal. Resp: clear to auscultation bilaterally Back: symmetric, no curvature. ROM normal. No CVA tenderness. Cardio: regular rate and rhythm, S1, S2 normal, no  murmur, click, rub or gallop GI: soft, non-tender; bowel sounds normal; no masses,  no organomegaly Extremities: extremities normal, atraumatic, no cyanosis or edema  ECOG PERFORMANCE STATUS: 1 - Symptomatic but completely ambulatory  Blood pressure (!) 127/44, pulse 78, temperature 98.6 F (37 C), temperature source Tympanic, resp. rate 17, height 5\' 2"  (1.575 m), weight 107 lb 1.6 oz (48.6 kg), SpO2 100 %.  LABORATORY DATA: Lab Results  Component Value Date   WBC 4.8 08/22/2020   HGB 10.8 (L) 08/22/2020   HCT 34.1 (L) 08/22/2020   MCV 95.5 08/22/2020   PLT 122 (L) 08/22/2020      Chemistry      Component Value Date/Time   NA 139 07/25/2020 1124   NA 143 03/17/2020 0915   K 4.9 07/25/2020 1124   CL 108 07/25/2020 1124   CO2 28 07/25/2020 1124   BUN 20 07/25/2020 1124    BUN 6 (L) 03/17/2020 0915   CREATININE 0.94 07/25/2020 1124      Component Value Date/Time   CALCIUM 9.9 07/25/2020 1124   ALKPHOS 56 07/25/2020 1124   AST 15 07/25/2020 1124   ALT 16 07/25/2020 1124   BILITOT 0.4 07/25/2020 1124       RADIOGRAPHIC STUDIES: CUP PACEART REMOTE DEVICE CHECK  Result Date: 08/10/2020 Scheduled remote reviewed. Normal device function.  AF burden 64% since 11/10/2019; rates controlled; known, on rivaroxaban Next remote 91 days.   ASSESSMENT AND PLAN: This is a very pleasant 85 years old African-American female recently diagnosed with extensive stage small cell lung cancer presented with central right upper lobe lung mass in addition to left lower lobe lung nodule and mediastinal lymphadenopathy and metastatic bone lesion in the left iliac wing diagnosed in April 2021. The patient started her treatment today with carboplatin for AUC of 5 on day 1, etoposide 100 mg/M2 on days 1, 2 and 3 with Neulasta support in addition to Imfinzi 1500 mg every 3 weeks during the chemotherapy course followed by 1500 mg every 4 weeks as maintenance. Status post 8 cycles.  Starting from cycle #5 she is on maintenance treatment with Imfinzi every 4 weeks.  The patient continues to tolerate her treatment well with no concerning adverse effects. I recommended for her to proceed with cycle #9 today as planned. I will see her back for follow-up visit in 4 weeks for evaluation with repeat CT scan of the chest, abdomen pelvis for restaging of her disease. The patient was advised to call immediately if she has any concerning symptoms in the interval. The patient voices understanding of current disease status and treatment options and is in agreement with the current care plan.  All questions were answered. The patient knows to call the clinic with any problems, questions or concerns. We can certainly see the patient much sooner if necessary.  Disclaimer: This note was dictated with  voice recognition software. Similar sounding words can inadvertently be transcribed and may not be corrected upon review.

## 2020-08-22 NOTE — Patient Instructions (Signed)

## 2020-08-22 NOTE — Patient Instructions (Signed)
[facility] Discharge Instructions for Patients Receiving Chemotherapy  Today you received the following chemotherapy agent: durvalumab (Imfinzi)  To help prevent nausea and vomiting after your treatment, we encourage you to take your nausea medication as directed.   If you develop nausea and vomiting that is not controlled by your nausea medication, call the clinic.   BELOW ARE SYMPTOMS THAT SHOULD BE REPORTED IMMEDIATELY:  *FEVER GREATER THAN 100.5 F  *CHILLS WITH OR WITHOUT FEVER  NAUSEA AND VOMITING THAT IS NOT CONTROLLED WITH YOUR NAUSEA MEDICATION  *UNUSUAL SHORTNESS OF BREATH  *UNUSUAL BRUISING OR BLEEDING  TENDERNESS IN MOUTH AND THROAT WITH OR WITHOUT PRESENCE OF ULCERS  *URINARY PROBLEMS  *BOWEL PROBLEMS  UNUSUAL RASH Items with * indicate a potential emergency and should be followed up as soon as possible.  Feel free to call the clinic should you have any questions or concerns. The clinic phone number is [phone].  Please show the [facility] at check-in to the Emergency Department and triage nurse.

## 2020-08-22 NOTE — Progress Notes (Signed)
Nutrition follow-up completed with patient during infusion for small cell lung cancer. Weight is stable at 107.1 pounds November 22 from 107.6 pounds October 25.  Her weight is decreased overall from 117 pounds in July 2021. Patient reports her abdominal pain is much better. She feels as if she is eating better. Diarrhea is controlled with Imodium. She continues to drink 1 carton of boost daily but reports increasing to boost plus.  She reports some difficulty finding this product.  She does not like Ensure.  Nutrition diagnosis: Unintended weight loss has stabilized.  Intervention: Provided support and encouragement for patient to continue increased calories and protein for weight maintenance/weight gain. Provided samples of boost plus. Encouraged patient to continue taking medication as prescribed by MD. Support provided.  Monitoring, evaluation, goals: Patient will tolerate adequate calories and protein to minimize further weight loss.  Next visit: To be scheduled with treatment as needed.  Patient has my contact information for questions.  **Disclaimer: This note was dictated with voice recognition software. Similar sounding words can inadvertently be transcribed and this note may contain transcription errors which may not have been corrected upon publication of note.**

## 2020-08-26 ENCOUNTER — Other Ambulatory Visit: Payer: Self-pay | Admitting: Family Medicine

## 2020-08-26 DIAGNOSIS — E876 Hypokalemia: Secondary | ICD-10-CM

## 2020-09-03 ENCOUNTER — Other Ambulatory Visit: Payer: Self-pay | Admitting: Family Medicine

## 2020-09-03 DIAGNOSIS — E119 Type 2 diabetes mellitus without complications: Secondary | ICD-10-CM

## 2020-09-05 DIAGNOSIS — H25043 Posterior subcapsular polar age-related cataract, bilateral: Secondary | ICD-10-CM | POA: Diagnosis not present

## 2020-09-05 DIAGNOSIS — E119 Type 2 diabetes mellitus without complications: Secondary | ICD-10-CM | POA: Diagnosis not present

## 2020-09-05 DIAGNOSIS — H2513 Age-related nuclear cataract, bilateral: Secondary | ICD-10-CM | POA: Diagnosis not present

## 2020-09-05 DIAGNOSIS — H25013 Cortical age-related cataract, bilateral: Secondary | ICD-10-CM | POA: Diagnosis not present

## 2020-09-05 DIAGNOSIS — H2511 Age-related nuclear cataract, right eye: Secondary | ICD-10-CM | POA: Diagnosis not present

## 2020-09-16 ENCOUNTER — Other Ambulatory Visit: Payer: Self-pay

## 2020-09-16 ENCOUNTER — Ambulatory Visit (HOSPITAL_COMMUNITY)
Admission: RE | Admit: 2020-09-16 | Discharge: 2020-09-16 | Disposition: A | Discharge status: Home / Self Care | Payer: Medicare Other | Source: Ambulatory Visit | Attending: Internal Medicine | Admitting: Internal Medicine

## 2020-09-16 DIAGNOSIS — C3490 Malignant neoplasm of unspecified part of unspecified bronchus or lung: Secondary | ICD-10-CM | POA: Insufficient documentation

## 2020-09-16 MED ORDER — IOHEXOL 300 MG/ML  SOLN
80.0000 mL | Freq: Once | INTRAMUSCULAR | Status: AC | PRN
  Administered 2020-09-16: 80 mL via INTRAVENOUS

## 2020-09-16 MED ORDER — HEPARIN SOD (PORK) LOCK FLUSH 100 UNIT/ML IV SOLN
500.0000 [IU] | Freq: Once | INTRAVENOUS | Status: AC
  Administered 2020-09-16: 500 [IU] via INTRAVENOUS

## 2020-09-16 MED ORDER — HEPARIN SOD (PORK) LOCK FLUSH 100 UNIT/ML IV SOLN
INTRAVENOUS | Status: AC
  Filled 2020-09-16: qty 5

## 2020-09-18 ENCOUNTER — Other Ambulatory Visit: Payer: Self-pay | Admitting: Interventional Cardiology

## 2020-09-19 ENCOUNTER — Other Ambulatory Visit: Payer: Self-pay

## 2020-09-19 ENCOUNTER — Inpatient Hospital Stay: Payer: Medicare Other

## 2020-09-19 ENCOUNTER — Inpatient Hospital Stay: Payer: Medicare Other | Attending: Internal Medicine | Admitting: Internal Medicine

## 2020-09-19 ENCOUNTER — Encounter: Payer: Medicare Other | Admitting: Nutrition

## 2020-09-19 VITALS — BP 148/51 | HR 66 | Temp 96.6°F | Resp 18 | Ht 62.0 in | Wt 107.6 lb

## 2020-09-19 DIAGNOSIS — E119 Type 2 diabetes mellitus without complications: Secondary | ICD-10-CM | POA: Insufficient documentation

## 2020-09-19 DIAGNOSIS — C3491 Malignant neoplasm of unspecified part of right bronchus or lung: Secondary | ICD-10-CM

## 2020-09-19 DIAGNOSIS — I480 Paroxysmal atrial fibrillation: Secondary | ICD-10-CM | POA: Insufficient documentation

## 2020-09-19 DIAGNOSIS — C3411 Malignant neoplasm of upper lobe, right bronchus or lung: Secondary | ICD-10-CM | POA: Insufficient documentation

## 2020-09-19 DIAGNOSIS — Z5112 Encounter for antineoplastic immunotherapy: Secondary | ICD-10-CM

## 2020-09-19 DIAGNOSIS — E039 Hypothyroidism, unspecified: Secondary | ICD-10-CM | POA: Diagnosis not present

## 2020-09-19 DIAGNOSIS — C7951 Secondary malignant neoplasm of bone: Secondary | ICD-10-CM | POA: Insufficient documentation

## 2020-09-19 DIAGNOSIS — Z95828 Presence of other vascular implants and grafts: Secondary | ICD-10-CM

## 2020-09-19 DIAGNOSIS — I10 Essential (primary) hypertension: Secondary | ICD-10-CM | POA: Diagnosis not present

## 2020-09-19 DIAGNOSIS — J449 Chronic obstructive pulmonary disease, unspecified: Secondary | ICD-10-CM | POA: Diagnosis not present

## 2020-09-19 LAB — TSH: TSH: 0.797 u[IU]/mL (ref 0.308–3.960)

## 2020-09-19 LAB — CMP (CANCER CENTER ONLY)
ALT: 28 U/L (ref 0–44)
AST: 24 U/L (ref 15–41)
Albumin: 3.4 g/dL — ABNORMAL LOW (ref 3.5–5.0)
Alkaline Phosphatase: 70 U/L (ref 38–126)
Anion gap: 4 — ABNORMAL LOW (ref 5–15)
BUN: 16 mg/dL (ref 8–23)
CO2: 29 mmol/L (ref 22–32)
Calcium: 9.4 mg/dL (ref 8.9–10.3)
Chloride: 109 mmol/L (ref 98–111)
Creatinine: 0.76 mg/dL (ref 0.44–1.00)
GFR, Estimated: >60 mL/min (ref >60)
Glucose, Bld: 76 mg/dL (ref 70–99)
Potassium: 4.2 mmol/L (ref 3.5–5.1)
Sodium: 142 mmol/L (ref 135–145)
Total Bilirubin: 0.5 mg/dL (ref 0.3–1.2)
Total Protein: 6.5 g/dL (ref 6.5–8.1)

## 2020-09-19 LAB — CBC WITH DIFFERENTIAL (CANCER CENTER ONLY)
Abs Immature Granulocytes: 0.01 10*3/uL (ref 0.00–0.07)
Basophils Absolute: 0 10*3/uL (ref 0.0–0.1)
Basophils Relative: 0 %
Eosinophils Absolute: 0.1 10*3/uL (ref 0.0–0.5)
Eosinophils Relative: 2 %
HCT: 31.9 % — ABNORMAL LOW (ref 36.0–46.0)
Hemoglobin: 10.3 g/dL — ABNORMAL LOW (ref 12.0–15.0)
Immature Granulocytes: 0 %
Lymphocytes Relative: 37 %
Lymphs Abs: 1.8 10*3/uL (ref 0.7–4.0)
MCH: 29.6 pg (ref 26.0–34.0)
MCHC: 32.3 g/dL (ref 30.0–36.0)
MCV: 91.7 fL (ref 80.0–100.0)
Monocytes Absolute: 0.5 10*3/uL (ref 0.1–1.0)
Monocytes Relative: 11 %
Neutro Abs: 2.4 10*3/uL (ref 1.7–7.7)
Neutrophils Relative %: 50 %
Platelet Count: 156 10*3/uL (ref 150–400)
RBC: 3.48 MIL/uL — ABNORMAL LOW (ref 3.87–5.11)
RDW: 15 % (ref 11.5–15.5)
WBC Count: 4.8 10*3/uL (ref 4.0–10.5)
nRBC: 0 % (ref 0.0–0.2)

## 2020-09-19 MED ORDER — SODIUM CHLORIDE 0.9 % IV SOLN
Freq: Once | INTRAVENOUS | Status: AC
  Filled 2020-09-19: qty 250

## 2020-09-19 MED ORDER — SODIUM CHLORIDE 0.9 % IV SOLN
1500.0000 mg | Freq: Once | INTRAVENOUS | Status: AC
  Administered 2020-09-19: 1500 mg via INTRAVENOUS
  Filled 2020-09-19: qty 30

## 2020-09-19 MED ORDER — HEPARIN SOD (PORK) LOCK FLUSH 100 UNIT/ML IV SOLN
500.0000 [IU] | Freq: Once | INTRAVENOUS | Status: AC | PRN
  Administered 2020-09-19: 500 [IU]
  Filled 2020-09-19: qty 5

## 2020-09-19 MED ORDER — SODIUM CHLORIDE 0.9% FLUSH
10.0000 mL | Freq: Once | INTRAVENOUS | Status: AC
  Administered 2020-09-19: 10 mL
  Filled 2020-09-19: qty 10

## 2020-09-19 MED ORDER — HEPARIN SOD (PORK) LOCK FLUSH 100 UNIT/ML IV SOLN
500.0000 [IU] | Freq: Once | INTRAVENOUS | Status: DC
  Filled 2020-09-19: qty 5

## 2020-09-19 MED ORDER — SODIUM CHLORIDE 0.9% FLUSH
10.0000 mL | INTRAVENOUS | Status: DC | PRN
  Administered 2020-09-19: 10 mL
  Filled 2020-09-19: qty 10

## 2020-09-19 NOTE — Patient Instructions (Signed)
[facility] Discharge Instructions for Patients Receiving Chemotherapy  Today you received the following chemotherapy agents: Imfinzi.  To help prevent nausea and vomiting after your treatment, we encourage you to take your nausea medication as directed.   If you develop nausea and vomiting that is not controlled by your nausea medication, call the clinic.   BELOW ARE SYMPTOMS THAT SHOULD BE REPORTED IMMEDIATELY:  *FEVER GREATER THAN 100.5 F  *CHILLS WITH OR WITHOUT FEVER  NAUSEA AND VOMITING THAT IS NOT CONTROLLED WITH YOUR NAUSEA MEDICATION  *UNUSUAL SHORTNESS OF BREATH  *UNUSUAL BRUISING OR BLEEDING  TENDERNESS IN MOUTH AND THROAT WITH OR WITHOUT PRESENCE OF ULCERS  *URINARY PROBLEMS  *BOWEL PROBLEMS  UNUSUAL RASH Items with * indicate a potential emergency and should be followed up as soon as possible.  Feel free to call the clinic should you have any questions or concerns. The clinic phone number is [phone].  Please show the CHEMO ALERT CARD at check-in to the Emergency Department and triage nurse.   

## 2020-09-19 NOTE — Telephone Encounter (Signed)
Age 85, weight 48kg, SCr 1.12 on 08/22/20, last visit aug 2021, afib indication

## 2020-09-19 NOTE — Progress Notes (Signed)
[place] [phone]   [phone]  OFFICE PROGRESS NOTE  [name], [name], MD [address] [place] [address]  DIAGNOSIS: Extensive stage (T2 a, N2, M1 C) small cell lung cancer presented with central right upper lobe lung mass in addition to left lower lobe pulmonary metastasis and bilateral hilar, subcarinal and bilateral paratracheal and left prevascular lymphadenopathy in addition to bone metastasis in the left iliac wing diagnosed in April 2021.  PRIOR THERAPY: None  CURRENT THERAPY: Systemic chemotherapy with carboplatin for AUC of 5 on day 1, etoposide 100 mg/M2 on days 1, 2 and 3 with Neulasta support in addition to Imfinzi 1500 mg IV every 3 weeks during chemotherapy followed by maintenance Imfinzi 1500 mg IV every 4 weeks after cycle #4.  Status post 9 cycles.  INTERVAL HISTORY: [name] 85 y.o. female returns to the clinic today for follow-up visit accompanied by her daughter.  The patient is feeling fine today with no concerning complaints except for cough and nasal congestion started recently.  She thinks that she had cold symptoms from her grandson who was tested negative for Covid.  She denied having any current chest pain, shortness of breath or hemoptysis.  She denied having any recent weight loss or night sweats.  She has no nausea, vomiting, diarrhea or constipation.  He has no recent weight loss or night sweats.  She continues to tolerate her treatment with Imfinzi fairly well.  The patient had repeat CT scan of the chest, abdomen pelvis performed recently and she is here for evaluation and discussion of her risk her results.   MEDICAL HISTORY: Past Medical History:  Diagnosis Date  . CAD (coronary artery disease), native coronary artery    PTCA of distal right coronary artery 1997 Cypher stents to circumflex 2005 Cardiac cath in 2011 with patent stent to circumflex and moderate disease elsewhere treated medically    . Cancer ([place])   . Cardiac pacemaker in situ 12/24/2015   Original implant reportedly in 1991 for tachybradycardia syndrome, generator change in 1999, 2004 and evidently again in 2016 in [place]   . COPD (chronic obstructive pulmonary disease) ([place])   . Diabetes mellitus without complication ([place])   . Hypertension   . Hypothyroidism 03/23/2010  . Pacemaker   . Paroxysmal atrial fibrillation ([place]) 03/23/2010   CHA2DS2VASC score 5     ALLERGIES:  is allergic to seasonal ic [cholestatin].  MEDICATIONS:  Current Outpatient Medications  Medication Sig Dispense Refill  . acetaminophen (TYLENOL) 500 MG tablet Take 500 mg by mouth every 6 (six) hours as needed for headache.    . albuterol (VENTOLIN HFA) 108 (90 Base) MCG/ACT inhaler TAKE 2 PUFFS BY MOUTH EVERY 6 HOURS AS NEEDED FOR WHEEZE OR SHORTNESS OF BREATH 1 each 1  . atorvastatin (LIPITOR) 10 MG tablet Take 1 tablet (10 mg total) by mouth daily. Please make yearly appt with Dr. Tamala Julian for November for future refills. Thank you 1st attempt 90 tablet 0  . carvedilol (COREG) 12.5 MG tablet Take 1 tablet (12.5 mg total) by mouth 2 (two) times daily with a meal. 180 tablet 2  . diclofenac sodium (VOLTAREN) 1 % GEL Apply 4 g topically 4 (four) times daily. (Patient not taking: Reported on 08/16/2020) 4 Tube 3  . doxepin (SINEQUAN) 10 MG/ML solution Take 0.3 mLs (3 mg total) by mouth at bedtime. 30 mL 1  . ELIQUIS 2.5 MG TABS tablet TAKE 1 TABLET BY MOUTH  TWICE A DAY 60 tablet 5  . fenofibrate (TRICOR) 48 MG tablet TAKE 1 TABLET BY MOUTH EVERY DAY (Patient taking differently: Take 48 mg by mouth daily after lunch. ) 90 tablet 3  . glucose blood (ONETOUCH VERIO) test strip Use to test 3-4 times daily. 300 strip 4  . KLOR-CON M20 20 MEQ tablet TAKE 1 TABLET BY MOUTH TWICE A DAY FOR 2 DAYS THEN TAKE 1 TABLET BY MOUTH EVERY DAY 90 tablet 1  . levothyroxine (SYNTHROID) 112 MCG tablet TAKE 1 TABLET (112 MCG TOTAL) BY MOUTH DAILY BEFORE BREAKFAST. 90  tablet 2  . lidocaine-prilocaine (EMLA) cream Apply 1 application topically as needed. (Patient not taking: Reported on 08/16/2020) 30 g 2  . Loperamide HCl (IMODIUM PO) Take by mouth as needed.    Marland Kitchen losartan (COZAAR) 50 MG tablet Take 2 tablets by mouth daily. 180 tablet 1  . nitroGLYCERIN (NITROSTAT) 0.4 MG SL tablet Place 1 tablet (0.4 mg total) under the tongue every 5 (five) minutes x 3 doses as needed for chest pain (Max 3 doses within 15 min. Call 911). 10 tablet 0  . oxyCODONE-acetaminophen (PERCOCET/ROXICET) 5-325 MG tablet Take 1 tablet by mouth every 8 (eight) hours as needed for severe pain. (Patient not taking: Reported on 08/16/2020) 20 tablet 0  . pantoprazole (PROTONIX) 40 MG tablet TAKE 1 TABLET BY MOUTH EVERY DAY 90 tablet 1  . prochlorperazine (COMPAZINE) 10 MG tablet Take 1 tablet (10 mg total) by mouth every 6 (six) hours as needed for nausea or vomiting. (Patient not taking: Reported on 08/16/2020) 30 tablet 1  . TRADJENTA 5 MG TABS tablet TAKE 1 TABLET (5 MG TOTAL) DAILY BY MOUTH. 90 tablet 1   No current facility-administered medications for this visit.    SURGICAL HISTORY:  Past Surgical History:  Procedure Laterality Date  . BIOPSY  01/08/2020   Procedure: BIOPSY;  Surgeon: Garner Nash, DO;  Location: [facility];  Service: Pulmonary;;  . BRONCHIAL BRUSHINGS  01/08/2020   Procedure: BRONCHIAL BRUSHINGS;  Surgeon: Garner Nash, DO;  Location: [facility];  Service: Pulmonary;;  . BRONCHIAL WASHINGS  01/08/2020   Procedure: BRONCHIAL WASHINGS;  Surgeon: Garner Nash, DO;  Location: [facility];  Service: Pulmonary;;  . CARDIAC CATHETERIZATION N/A 12/26/2015   Procedure: Left Heart Cath and Coronary Angiography;  Surgeon: Peter M [name], MD;  Location: [facility];  Service: Cardiovascular;  Laterality: N/A;  . CARDIOVERSION  05/2015  . CHOLECYSTECTOMY  2012  . CORONARY ANGIOPLASTY WITH STENT PLACEMENT  1990  . ENDOBRONCHIAL ULTRASOUND  01/08/2020    Procedure: ENDOBRONCHIAL ULTRASOUND;  Surgeon: Garner Nash, DO;  Location: [facility];  Service: Pulmonary;;  . FINE NEEDLE ASPIRATION BIOPSY  01/08/2020   Procedure: FINE NEEDLE ASPIRATION BIOPSY;  Surgeon: Garner Nash, DO;  Location: [facility];  Service: Pulmonary;;  . HERNIA REPAIR    . IR IMAGING GUIDED PORT INSERTION  02/18/2020  . PACEMAKER GENERATOR CHANGE  [id number], 2016  . PACEMAKER INSERTION  1991  . VIDEO BRONCHOSCOPY WITH ENDOBRONCHIAL ULTRASOUND N/A 01/08/2020   Procedure: VIDEO BRONCHOSCOPY;  Surgeon: Garner Nash, DO;  Location: [facility];  Service: Pulmonary;  Laterality: N/A;    REVIEW OF SYSTEMS:  Constitutional: positive for fatigue Eyes: negative Ears, nose, mouth, throat, and face: positive for nasal congestion Respiratory: positive for cough Cardiovascular: negative Gastrointestinal: negative Genitourinary:negative Integument/breast: negative Hematologic/lymphatic: negative Musculoskeletal:negative Neurological: negative Behavioral/Psych: negative Endocrine: negative Allergic/Immunologic: negative   PHYSICAL EXAMINATION: General appearance: alert,  cooperative, appears stated age, fatigued and no distress Head: Normocephalic, without obvious abnormality, atraumatic Neck: no adenopathy, no JVD, supple, symmetrical, trachea midline and thyroid not enlarged, symmetric, no tenderness/mass/nodules Lymph nodes: Cervical, supraclavicular, and axillary nodes normal. Resp: clear to auscultation bilaterally Back: symmetric, no curvature. ROM normal. No CVA tenderness. Cardio: regular rate and rhythm, S1, S2 normal, no murmur, click, rub or gallop GI: soft, non-tender; bowel sounds normal; no masses,  no organomegaly Extremities: extremities normal, atraumatic, no cyanosis or edema Neurologic: Alert and oriented X 3, normal strength and tone. Normal symmetric reflexes. Normal coordination and gait  ECOG PERFORMANCE STATUS: 1 - Symptomatic but  completely ambulatory  Blood pressure (!) 148/51, pulse 66, temperature (!) 96.6 F (35.9 C), temperature source Tympanic, resp. rate 18, height 5\' 2"  (1.575 m), weight 107 lb 9.6 oz (48.8 kg), SpO2 100 %.  LABORATORY DATA: Lab Results  Component Value Date   WBC 4.8 09/19/2020   HGB 10.3 (L) 09/19/2020   HCT 31.9 (L) 09/19/2020   MCV 91.7 09/19/2020   PLT 156 09/19/2020      Chemistry      Component Value Date/Time   NA 140 08/22/2020 1200   NA 143 03/17/2020 0915   K 4.1 08/22/2020 1200   CL 112 (H) 08/22/2020 1200   CO2 22 08/22/2020 1200   BUN 20 08/22/2020 1200   BUN 6 (L) 03/17/2020 0915   CREATININE 1.12 (H) 08/22/2020 1200      Component Value Date/Time   CALCIUM 9.1 08/22/2020 1200   ALKPHOS 68 08/22/2020 1200   AST 20 08/22/2020 1200   ALT 20 08/22/2020 1200   BILITOT 0.3 08/22/2020 1200       RADIOGRAPHIC STUDIES: CT Chest W Contrast  Result Date: 09/19/2020 CLINICAL DATA:  Restaging small cell lung cancer. Undergoing immunotherapy, completed chemotherapy. EXAM: CT CHEST, ABDOMEN, AND PELVIS WITH CONTRAST TECHNIQUE: Multidetector CT imaging of the chest, abdomen and pelvis was performed following the standard protocol during bolus administration of intravenous contrast. CONTRAST:  77mL OMNIPAQUE IOHEXOL 300 MG/ML  SOLN COMPARISON:  06/22/2020 FINDINGS: CT CHEST FINDINGS Cardiovascular: AICD noted.  Left Port-A-Cath tip: SVC. Coronary, aortic arch, and branch vessel atherosclerotic vascular disease. Mild cardiomegaly. Stable linear calcific density along a subsegmental left lower lobe pulmonary arterial branch on image 24 series 2, no change, probably calcification related to a remote pulmonary embolus. No acute pulmonary embolus identified although today's exam was not performed using pulmonary embolus protocol. Mediastinum/Nodes: Right hilar node 0.6 cm in short axis on image 21 series 2, previously 0.9 cm by my measurement. Indistinct right lower paratracheal  node 1.0 cm in short axis on image 19 series 2, previously measured at 1.2 cm. Lungs/Pleura: Severe emphysema. Right upper lobe paramediastinal nodule 1.9 by 1.3 cm on image 44 series 4, previously 2.1 by 1.4 cm by my measurements. Triangular left lower lobe pulmonary nodule 0.8 by 0.8 cm on image 68 series 4, previously the same by my measurements. Mild scarring in both lower lobes.  No new or enlarging nodules. Musculoskeletal: Thoracic spondylosis. CT ABDOMEN PELVIS FINDINGS Hepatobiliary: Cholecystectomy.  Otherwise unremarkable. Pancreas: Chronic mild prominence of the dorsal pancreatic duct in the head of the pancreas extending to the ampulla, previously 0.4 cm, currently 0.5 cm in diameter, without a well-defined causative lesion. Spleen: Unremarkable Adrenals/Urinary Tract: Left mid kidney hypodense lesion compatible with cyst. Adrenal glands unremarkable. Vascular calcifications in both renal hila. Stomach/Bowel: Is mild sigmoid colon diverticulosis. Vascular/Lymphatic: Aortoiliac atherosclerotic vascular disease. No pathologic adenopathy  identified. Reproductive: Unremarkable Other: No supplemental non-categorized findings. Musculoskeletal: Sclerosis along the SI joints, especially in the left iliac bone, similar to prior. Prior right hip ORIF. IMPRESSION: 1. Minimal reduction in size of the right upper lobe paramediastinal nodule and right lower paratracheal adenopathy. 2. Stable left lower lobe pulmonary nodule. 3. Other imaging findings of potential clinical significance: Coronary atherosclerosis with mild cardiomegaly. Chronic mild prominence of the dorsal pancreatic duct in the head of the pancreas extending to the ampulla, without a well-defined causative lesion. 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 Mild sigmoid colon diverticulosis. 4. Emphysema and aortic atherosclerosis. Aortic Atherosclerosis (ICD10-I70.0) and  Emphysema (ICD10-J43.9). Electronically Signed   By: Van Clines M.D.   On: 09/19/2020 08:59   CT Abdomen Pelvis W Contrast  Result Date: 09/19/2020 CLINICAL DATA:  Restaging small cell lung cancer. Undergoing immunotherapy, completed chemotherapy. EXAM: CT CHEST, ABDOMEN, AND PELVIS WITH CONTRAST TECHNIQUE: Multidetector CT imaging of the chest, abdomen and pelvis was performed following the standard protocol during bolus administration of intravenous contrast. CONTRAST:  73mL OMNIPAQUE IOHEXOL 300 MG/ML  SOLN COMPARISON:  06/22/2020 FINDINGS: CT CHEST FINDINGS Cardiovascular: AICD noted.  Left Port-A-Cath tip: SVC. Coronary, aortic arch, and branch vessel atherosclerotic vascular disease. Mild cardiomegaly. Stable linear calcific density along a subsegmental left lower lobe pulmonary arterial branch on image 24 series 2, no change, probably calcification related to a remote pulmonary embolus. No acute pulmonary embolus identified although today's exam was not performed using pulmonary embolus protocol. Mediastinum/Nodes: Right hilar node 0.6 cm in short axis on image 21 series 2, previously 0.9 cm by my measurement. Indistinct right lower paratracheal node 1.0 cm in short axis on image 19 series 2, previously measured at 1.2 cm. Lungs/Pleura: Severe emphysema. Right upper lobe paramediastinal nodule 1.9 by 1.3 cm on image 44 series 4, previously 2.1 by 1.4 cm by my measurements. Triangular left lower lobe pulmonary nodule 0.8 by 0.8 cm on image 68 series 4, previously the same by my measurements. Mild scarring in both lower lobes.  No new or enlarging nodules. Musculoskeletal: Thoracic spondylosis. CT ABDOMEN PELVIS FINDINGS Hepatobiliary: Cholecystectomy.  Otherwise unremarkable. Pancreas: Chronic mild prominence of the dorsal pancreatic duct in the head of the pancreas extending to the ampulla, previously 0.4 cm, currently 0.5 cm in diameter, without a well-defined causative lesion. Spleen:  Unremarkable Adrenals/Urinary Tract: Left mid kidney hypodense lesion compatible with cyst. Adrenal glands unremarkable. Vascular calcifications in both renal hila. Stomach/Bowel: Is mild sigmoid colon diverticulosis. Vascular/Lymphatic: Aortoiliac atherosclerotic vascular disease. No pathologic adenopathy identified. Reproductive: Unremarkable Other: No supplemental non-categorized findings. Musculoskeletal: Sclerosis along the SI joints, especially in the left iliac bone, similar to prior. Prior right hip ORIF. IMPRESSION: 1. Minimal reduction in size of the right upper lobe paramediastinal nodule and right lower paratracheal adenopathy. 2. Stable left lower lobe pulmonary nodule. 3. Other imaging findings of potential clinical significance: Coronary atherosclerosis with mild cardiomegaly. Chronic mild prominence of the dorsal pancreatic duct in the head of the pancreas extending to the ampulla, without a well-defined causative lesion. 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 Mild sigmoid colon diverticulosis. 4. Emphysema and aortic atherosclerosis. Aortic Atherosclerosis (ICD10-I70.0) and Emphysema (ICD10-J43.9). Electronically Signed   By: Van Clines M.D.   On: 09/19/2020 08:59    ASSESSMENT AND PLAN: This is a very pleasant 85 years old African-American female recently diagnosed  with extensive stage small cell lung cancer presented with central right upper lobe lung mass in addition to left lower lobe lung nodule and mediastinal lymphadenopathy and metastatic bone lesion in the left iliac wing diagnosed in April 2021. The patient started her treatment today with carboplatin for AUC of 5 on day 1, etoposide 100 mg/M2 on days 1, 2 and 3 with Neulasta support in addition to Imfinzi 1500 mg every 3 weeks during the chemotherapy course followed by 1500 mg every 4 weeks as maintenance. Status post 9 cycles.  Starting from  cycle #5 she is on maintenance treatment with Imfinzi every 4 weeks.  The patient continues to tolerate her treatment fairly well with no concerning adverse effects. She had repeat CT scan of the chest performed recently.  I personally and independently reviewed the scans and discussed the results with the patient and her daughter today. Her scan showed no concerning findings for disease progression. I recommended for the patient to continue with her current treatment with Imfinzi and she will proceed with cycle #10 today. She will come back for follow-up visit in 4 weeks for evaluation before the next cycle of her treatment. She was advised to call immediately if she has any other concerning symptoms in the interval. The patient voices understanding of current disease status and treatment options and is in agreement with the current care plan.  All questions were answered. The patient knows to call the clinic with any problems, questions or concerns. We can certainly see the patient much sooner if necessary.  Disclaimer: This note was dictated with voice recognition software. Similar sounding words can inadvertently be transcribed and may not be corrected upon review.

## 2020-09-19 NOTE — Patient Instructions (Signed)

## 2020-09-20 ENCOUNTER — Telehealth: Payer: Self-pay | Admitting: Pharmacist

## 2020-09-20 NOTE — Progress Notes (Signed)
Chronic Care Management Pharmacy Assistant   Name: [name]  MRN: [id number] DOB: June 04, 1935  Reason for Encounter: Medication Review  PCP : [name], [name], MD  Allergies:   Allergies  Allergen Reactions  . Seasonal Ic [Cholestatin]     Itchy eye and runny nose    Medications: Outpatient Encounter Medications as of 09/20/2020  Medication Sig Note  . acetaminophen (TYLENOL) 500 MG tablet Take 500 mg by mouth every 6 (six) hours as needed for headache.   . albuterol (VENTOLIN HFA) 108 (90 Base) MCG/ACT inhaler TAKE 2 PUFFS BY MOUTH EVERY 6 HOURS AS NEEDED FOR WHEEZE OR SHORTNESS OF BREATH   . atorvastatin (LIPITOR) 10 MG tablet Take 1 tablet (10 mg total) by mouth daily. Please make yearly appt with Dr. Tamala Julian for November for future refills. Thank you 1st attempt   . carvedilol (COREG) 12.5 MG tablet Take 1 tablet (12.5 mg total) by mouth 2 (two) times daily with a meal.   . diclofenac sodium (VOLTAREN) 1 % GEL Apply 4 g topically 4 (four) times daily. (Patient not taking: Reported on 08/16/2020)   . doxepin (SINEQUAN) 10 MG/ML solution Take 0.3 mLs (3 mg total) by mouth at bedtime.   Marland Kitchen ELIQUIS 2.5 MG TABS tablet TAKE 1 TABLET BY MOUTH TWICE A DAY   . fenofibrate (TRICOR) 48 MG tablet TAKE 1 TABLET BY MOUTH EVERY DAY (Patient taking differently: Take 48 mg by mouth daily after lunch. )   . glucose blood (ONETOUCH VERIO) test strip Use to test 3-4 times daily.   Marland Kitchen KLOR-CON M20 20 MEQ tablet TAKE 1 TABLET BY MOUTH TWICE A DAY FOR 2 DAYS THEN TAKE 1 TABLET BY MOUTH EVERY DAY   . levothyroxine (SYNTHROID) 112 MCG tablet TAKE 1 TABLET (112 MCG TOTAL) BY MOUTH DAILY BEFORE BREAKFAST.   Marland Kitchen lidocaine-prilocaine (EMLA) cream Apply 1 application topically as needed. (Patient not taking: Reported on 08/16/2020)   . Loperamide HCl (IMODIUM PO) Take by mouth as needed.   Marland Kitchen losartan (COZAAR) 50 MG tablet Take 2 tablets by mouth daily.   . nitroGLYCERIN (NITROSTAT) 0.4 MG SL tablet Place 1  tablet (0.4 mg total) under the tongue every 5 (five) minutes x 3 doses as needed for chest pain (Max 3 doses within 15 min. Call 911).   Marland Kitchen oxyCODONE-acetaminophen (PERCOCET/ROXICET) 5-325 MG tablet Take 1 tablet by mouth every 8 (eight) hours as needed for severe pain. (Patient not taking: Reported on 08/16/2020) 02/18/2020: Not started  . pantoprazole (PROTONIX) 40 MG tablet TAKE 1 TABLET BY MOUTH EVERY DAY   . prochlorperazine (COMPAZINE) 10 MG tablet Take 1 tablet (10 mg total) by mouth every 6 (six) hours as needed for nausea or vomiting. (Patient not taking: Reported on 08/16/2020)   . TRADJENTA 5 MG TABS tablet TAKE 1 TABLET (5 MG TOTAL) DAILY BY MOUTH.    No facility-administered encounter medications on file as of 09/20/2020.    Current Diagnosis: Patient Active Problem List   Diagnosis Date Noted  . Epigastric abdominal pain 04/27/2020  . Port-A-Cath in place 04/11/2020  . Small cell lung cancer, right ([place]) 01/28/2020  . Encounter for antineoplastic chemotherapy 01/28/2020  . Encounter for antineoplastic immunotherapy 01/28/2020  . Goals of care, counseling/discussion 01/28/2020  . Lung cancer ([place]) 01/06/2020  . Near syncope 01/05/2020  . Mediastinal lymphadenopathy 01/01/2020  . Multiple lung nodules on CT 01/01/2020  . Atherosclerosis of aorta ([place]) 12/28/2019  . Chronic diarrhea 12/15/2019  . Hyperlipidemia associated with  type 2 diabetes mellitus ([place]) 06/10/2019  . Chronic rhinitis 02/28/2018  . Tobacco use disorder 05/10/2017  . Hypertension with heart disease 05/10/2017  . Chest pain 03/10/2017  . Acute thoracic back pain   . Cardiac pacemaker in situ 12/24/2015  . History of Graves' disease 12/24/2015  . Current use of long term anticoagulation 12/24/2015  . Type 2 diabetes mellitus with other specified complication ([place])   . Hypothyroidism 03/23/2010  . Paroxysmal atrial fibrillation ([place]) 03/23/2010  . COPD (chronic obstructive pulmonary disease) ([place])  03/23/2010  . CAD (coronary artery disease), native coronary artery   . GERD     Goals Addressed   None     Follow-Up:  Pharmacist Review Patient requesting refill for  Eliquis 2.5 mg Tablet.Completed acute form and forward it to CPP.  Notified clinical pharmacist of above.  [place] Pharmacist Assistant [phone]

## 2020-10-03 ENCOUNTER — Other Ambulatory Visit: Payer: Self-pay | Admitting: Family Medicine

## 2020-10-03 DIAGNOSIS — K219 Gastro-esophageal reflux disease without esophagitis: Secondary | ICD-10-CM

## 2020-10-04 ENCOUNTER — Emergency Department (HOSPITAL_COMMUNITY)
Admission: EM | Admit: 2020-10-04 | Discharge: 2020-10-04 | Disposition: A | Discharge status: Home / Self Care | Payer: Medicare Other | Attending: Emergency Medicine | Admitting: Emergency Medicine

## 2020-10-04 ENCOUNTER — Encounter (HOSPITAL_COMMUNITY): Payer: Self-pay | Admitting: Emergency Medicine

## 2020-10-04 DIAGNOSIS — R1030 Lower abdominal pain, unspecified: Secondary | ICD-10-CM | POA: Diagnosis not present

## 2020-10-04 DIAGNOSIS — Z5321 Procedure and treatment not carried out due to patient leaving prior to being seen by health care provider: Secondary | ICD-10-CM | POA: Diagnosis not present

## 2020-10-04 DIAGNOSIS — R109 Unspecified abdominal pain: Secondary | ICD-10-CM | POA: Diagnosis not present

## 2020-10-04 DIAGNOSIS — Z743 Need for continuous supervision: Secondary | ICD-10-CM | POA: Diagnosis not present

## 2020-10-04 DIAGNOSIS — R102 Pelvic and perineal pain: Secondary | ICD-10-CM | POA: Diagnosis not present

## 2020-10-04 NOTE — ED Notes (Signed)
Pt wants her port accessed

## 2020-10-04 NOTE — ED Triage Notes (Signed)
Per EMS-coming from home complaining pelvic/lower abdominal  pain-started this am after eating breakfast has gotten better-CBG 126-no N/V

## 2020-10-05 ENCOUNTER — Encounter: Payer: Self-pay | Admitting: Family Medicine

## 2020-10-05 ENCOUNTER — Other Ambulatory Visit: Payer: Self-pay

## 2020-10-05 ENCOUNTER — Ambulatory Visit (INDEPENDENT_AMBULATORY_CARE_PROVIDER_SITE_OTHER): Payer: Medicare Other | Admitting: Family Medicine

## 2020-10-05 VITALS — BP 140/68 | HR 59 | Temp 97.9°F | Ht 62.0 in | Wt 107.8 lb

## 2020-10-05 DIAGNOSIS — Z23 Encounter for immunization: Secondary | ICD-10-CM

## 2020-10-05 DIAGNOSIS — R1030 Lower abdominal pain, unspecified: Secondary | ICD-10-CM | POA: Diagnosis not present

## 2020-10-05 DIAGNOSIS — R1010 Upper abdominal pain, unspecified: Secondary | ICD-10-CM

## 2020-10-05 LAB — POCT URINALYSIS DIPSTICK
Bilirubin, UA: NEGATIVE
Blood, UA: NEGATIVE
Glucose, UA: NEGATIVE
Ketones, UA: NEGATIVE
Leukocytes, UA: NEGATIVE
Nitrite, UA: NEGATIVE
Protein, UA: NEGATIVE
Spec Grav, UA: 1.025 (ref 1.010–1.025)
Urobilinogen, UA: 0.2 E.U./dL
pH, UA: 5.5 (ref 5.0–8.0)

## 2020-10-05 NOTE — Progress Notes (Signed)
   Subjective:    Patient ID: [name], female    DOB: Jun 17, 1935, 86 y.o.   MRN: [id number]  HPI Here with her daughter for a bout of lower abdominal pain that she had yesterday around noon. This started shortly after she ate part of a sandwich. The pain was severe and crampy, but it only lasted about one hour. There was no nausea or vomiting. No fever. She has chronic diarrhea, and she has had several loose stools as usual in the past few days. She went to the ER yesterday, but she left without being seen. She has felt fine ever since with no more abdominal pains. She ate the rest of the sandwich last night and she did fine. This morning she feels fine and she is hungry. She denies any urinary symptoms. She sees Dr. Julien Nordmann for lung cancer, and she had a CT of the abdomen and pelvis on 09-16-20. This showed nothing abnormal in the abdomen or pelvis. A CBC drawn on 09-19-20 was normal with a WBC of 4.8. A UA today is clear.    Review of Systems  Constitutional: Negative.   Respiratory: Negative.   Cardiovascular: Negative.   Gastrointestinal: Positive for abdominal pain and diarrhea. Negative for abdominal distention, anal bleeding, blood in stool, constipation, nausea, rectal pain and vomiting.  Genitourinary: Negative.        Objective:   Physical Exam Constitutional:      Appearance: Normal appearance. She is well-developed. She is not ill-appearing.  Cardiovascular:     Rate and Rhythm: Normal rate and regular rhythm.     Pulses: Normal pulses.     Heart sounds: Normal heart sounds.  Pulmonary:     Effort: Pulmonary effort is normal.     Breath sounds: Normal breath sounds.  Abdominal:     General: Abdomen is flat. Bowel sounds are normal. There is no distension.     Palpations: Abdomen is soft. There is no mass.     Tenderness: There is no abdominal tenderness. There is no right CVA tenderness, left CVA tenderness, guarding or rebound.     Hernia: No hernia is present.   Neurological:     Mental Status: She is alert.           Assessment & Plan:  She had a brief bout of lower abdominal pain yesterday of uncertain etiology. She has felt fine ever since. We will simply observe her at this point. She will let us know if the pain returns.  Alysia Penna, MD

## 2020-10-12 ENCOUNTER — Telehealth: Payer: Self-pay | Admitting: Pharmacist

## 2020-10-12 NOTE — Chronic Care Management (AMB) (Signed)
Chronic Care Management Pharmacy Assistant   Name: Allannah Kempen  MRN: [id number] DOB: [birth year]  Reason for Encounter: Medication Review  PCP : [name], [name], MD  Allergies:   Allergies  Allergen Reactions  . Seasonal Ic [Cholestatin]     Itchy eye and runny nose    Medications: Outpatient Encounter Medications as of 10/12/2020  Medication Sig Note  . acetaminophen (TYLENOL) 500 MG tablet Take 500 mg by mouth every 6 (six) hours as needed for headache.   . albuterol (VENTOLIN HFA) 108 (90 Base) MCG/ACT inhaler TAKE 2 PUFFS BY MOUTH EVERY 6 HOURS AS NEEDED FOR WHEEZE OR SHORTNESS OF BREATH   . atorvastatin (LIPITOR) 10 MG tablet Take 1 tablet (10 mg total) by mouth daily. Please make yearly appt with Dr. Tamala Julian for November for future refills. Thank you 1st attempt   . carvedilol (COREG) 12.5 MG tablet Take 1 tablet (12.5 mg total) by mouth 2 (two) times daily with a meal.   . diclofenac sodium (VOLTAREN) 1 % GEL Apply 4 g topically 4 (four) times daily. (Patient not taking: No sig reported)   . doxepin (SINEQUAN) 10 MG/ML solution Take 0.3 mLs (3 mg total) by mouth at bedtime. (Patient not taking: Reported on 10/05/2020)   . ELIQUIS 2.5 MG TABS tablet TAKE 1 TABLET BY MOUTH TWICE A DAY   . fenofibrate (TRICOR) 48 MG tablet TAKE 1 TABLET BY MOUTH EVERY DAY (Patient taking differently: Take 48 mg by mouth daily after lunch.)   . glucose blood (ONETOUCH VERIO) test strip Use to test 3-4 times daily.   Marland Kitchen KLOR-CON M20 20 MEQ tablet TAKE 1 TABLET BY MOUTH TWICE A DAY FOR 2 DAYS THEN TAKE 1 TABLET BY MOUTH EVERY DAY   . levothyroxine (SYNTHROID) 112 MCG tablet TAKE 1 TABLET (112 MCG TOTAL) BY MOUTH DAILY BEFORE BREAKFAST.   Marland Kitchen lidocaine-prilocaine (EMLA) cream Apply 1 application topically as needed. (Patient not taking: No sig reported)   . Loperamide HCl (IMODIUM PO) Take by mouth as needed.   Marland Kitchen losartan (COZAAR) 50 MG tablet Take 2 tablets by mouth daily.   . nitroGLYCERIN (NITROSTAT)  0.4 MG SL tablet Place 1 tablet (0.4 mg total) under the tongue every 5 (five) minutes x 3 doses as needed for chest pain (Max 3 doses within 15 min. Call 911).   Marland Kitchen oxyCODONE-acetaminophen (PERCOCET/ROXICET) 5-325 MG tablet Take 1 tablet by mouth every 8 (eight) hours as needed for severe pain. (Patient not taking: No sig reported) 02/18/2020: Not started  . pantoprazole (PROTONIX) 40 MG tablet TAKE 1 TABLET BY MOUTH EVERY DAY   . prochlorperazine (COMPAZINE) 10 MG tablet Take 1 tablet (10 mg total) by mouth every 6 (six) hours as needed for nausea or vomiting. (Patient not taking: No sig reported)   . TRADJENTA 5 MG TABS tablet TAKE 1 TABLET (5 MG TOTAL) DAILY BY MOUTH.    No facility-administered encounter medications on file as of 10/12/2020.    Current Diagnosis: Patient Active Problem List   Diagnosis Date Noted  . Epigastric abdominal pain 04/27/2020  . Port-A-Cath in place 04/11/2020  . Small cell lung cancer, right ([place]) 01/28/2020  . Encounter for antineoplastic chemotherapy 01/28/2020  . Encounter for antineoplastic immunotherapy 01/28/2020  . Goals of care, counseling/discussion 01/28/2020  . Lung cancer ([place]) 01/06/2020  . Near syncope 01/05/2020  . Mediastinal lymphadenopathy 01/01/2020  . Multiple lung nodules on CT 01/01/2020  . Atherosclerosis of aorta ([place]) 12/28/2019  . Chronic diarrhea 12/15/2019  .  Hyperlipidemia associated with type 2 diabetes mellitus ([place]) 06/10/2019  . Chronic rhinitis 02/28/2018  . Tobacco use disorder 05/10/2017  . Hypertension with heart disease 05/10/2017  . Chest pain 03/10/2017  . Acute thoracic back pain   . Cardiac pacemaker in situ 12/24/2015  . History of Graves' disease 12/24/2015  . Current use of long term anticoagulation 12/24/2015  . Type 2 diabetes mellitus with other specified complication ([place])   . Hypothyroidism 03/23/2010  . Paroxysmal atrial fibrillation ([place]) 03/23/2010  . COPD (chronic obstructive pulmonary disease)  ([place]) 03/23/2010  . CAD (coronary artery disease), native coronary artery   . GERD     Goals Addressed   None    Follow-Up:  Pharmacist Review   10-19-2019-  I have attempted three times without success to contact this patient by phone to do his monthly assessment call. I left a voice messages for the patient to return my call.   Maia Breslow, [place] Assistant [phone]

## 2020-10-17 ENCOUNTER — Inpatient Hospital Stay: Payer: Medicaid Other

## 2020-10-17 ENCOUNTER — Inpatient Hospital Stay: Payer: Medicaid Other | Admitting: Internal Medicine

## 2020-10-17 ENCOUNTER — Inpatient Hospital Stay: Payer: Medicaid Other | Admitting: Nutrition

## 2020-10-18 ENCOUNTER — Ambulatory Visit: Payer: Medicare Other | Admitting: Internal Medicine

## 2020-10-18 ENCOUNTER — Other Ambulatory Visit: Payer: Medicare Other

## 2020-10-18 ENCOUNTER — Other Ambulatory Visit: Payer: Self-pay | Admitting: Family Medicine

## 2020-10-18 ENCOUNTER — Telehealth: Payer: Self-pay | Admitting: Family Medicine

## 2020-10-18 ENCOUNTER — Ambulatory Visit: Payer: Medicare Other

## 2020-10-18 DIAGNOSIS — E119 Type 2 diabetes mellitus without complications: Secondary | ICD-10-CM

## 2020-10-18 MED ORDER — LINAGLIPTIN 5 MG PO TABS: ORAL_TABLET | ORAL | 1 refills | Status: DC

## 2020-10-18 NOTE — Telephone Encounter (Signed)
Rx sent in

## 2020-10-18 NOTE — Telephone Encounter (Signed)
Patient is calling and requesting a refill for TRADJENTA 5 MG TABS tablet sent to  [id number] [address], [place] [place], [place] [address]  Phone:  [phone] Fax:  [phone]  CB is [phone]

## 2020-10-19 ENCOUNTER — Telehealth: Payer: Self-pay | Admitting: Internal Medicine

## 2020-10-19 ENCOUNTER — Telehealth: Payer: Self-pay | Admitting: Pharmacist

## 2020-10-19 NOTE — Progress Notes (Signed)
Chronic Care Management Pharmacy Assistant   Name: [name]  MRN: [id number] DOB: 07-02-1935  Reason for Encounter: Medication Review  PCP : [name], [name], MD  Allergies:   Allergies  Allergen Reactions  . Seasonal Ic [Cholestatin]     Itchy eye and runny nose    Medications: Outpatient Encounter Medications as of 10/19/2020  Medication Sig Note  . acetaminophen (TYLENOL) 500 MG tablet Take 500 mg by mouth every 6 (six) hours as needed for headache.   . albuterol (VENTOLIN HFA) 108 (90 Base) MCG/ACT inhaler TAKE 2 PUFFS BY MOUTH EVERY 6 HOURS AS NEEDED FOR WHEEZE OR SHORTNESS OF BREATH   . atorvastatin (LIPITOR) 10 MG tablet Take 1 tablet (10 mg total) by mouth daily. Please make yearly appt with Dr. Tamala Julian for November for future refills. Thank you 1st attempt   . carvedilol (COREG) 12.5 MG tablet TAKE 1 TABLET (12.5 MG TOTAL) BY MOUTH 2 (TWO) TIMES DAILY WITH A MEAL.   Marland Kitchen diclofenac sodium (VOLTAREN) 1 % GEL Apply 4 g topically 4 (four) times daily. (Patient not taking: No sig reported)   . doxepin (SINEQUAN) 10 MG/ML solution Take 0.3 mLs (3 mg total) by mouth at bedtime. (Patient not taking: Reported on 10/05/2020)   . ELIQUIS 2.5 MG TABS tablet TAKE 1 TABLET BY MOUTH TWICE A DAY   . fenofibrate (TRICOR) 48 MG tablet TAKE 1 TABLET BY MOUTH EVERY DAY (Patient taking differently: Take 48 mg by mouth daily after lunch.)   . glucose blood (ONETOUCH VERIO) test strip Use to test 3-4 times daily.   Marland Kitchen KLOR-CON M20 20 MEQ tablet TAKE 1 TABLET BY MOUTH TWICE A DAY FOR 2 DAYS THEN TAKE 1 TABLET BY MOUTH EVERY DAY   . levothyroxine (SYNTHROID) 112 MCG tablet TAKE 1 TABLET (112 MCG TOTAL) BY MOUTH DAILY BEFORE BREAKFAST.   Marland Kitchen lidocaine-prilocaine (EMLA) cream Apply 1 application topically as needed. (Patient not taking: No sig reported)   . linagliptin (TRADJENTA) 5 MG TABS tablet TAKE 1 TABLET (5 MG TOTAL) DAILY BY MOUTH.   . Loperamide HCl (IMODIUM PO) Take by mouth as needed.   Marland Kitchen  losartan (COZAAR) 50 MG tablet Take 2 tablets by mouth daily.   . nitroGLYCERIN (NITROSTAT) 0.4 MG SL tablet Place 1 tablet (0.4 mg total) under the tongue every 5 (five) minutes x 3 doses as needed for chest pain (Max 3 doses within 15 min. Call 911).   Marland Kitchen oxyCODONE-acetaminophen (PERCOCET/ROXICET) 5-325 MG tablet Take 1 tablet by mouth every 8 (eight) hours as needed for severe pain. (Patient not taking: No sig reported) 02/18/2020: Not started  . pantoprazole (PROTONIX) 40 MG tablet TAKE 1 TABLET BY MOUTH EVERY DAY   . prochlorperazine (COMPAZINE) 10 MG tablet Take 1 tablet (10 mg total) by mouth every 6 (six) hours as needed for nausea or vomiting. (Patient not taking: No sig reported)    No facility-administered encounter medications on file as of 10/19/2020.    Current Diagnosis: Patient Active Problem List   Diagnosis Date Noted  . Epigastric abdominal pain 04/27/2020  . Port-A-Cath in place 04/11/2020  . Small cell lung cancer, right ([place]) 01/28/2020  . Encounter for antineoplastic chemotherapy 01/28/2020  . Encounter for antineoplastic immunotherapy 01/28/2020  . Goals of care, counseling/discussion 01/28/2020  . Lung cancer ([place]) 01/06/2020  . Near syncope 01/05/2020  . Mediastinal lymphadenopathy 01/01/2020  . Multiple lung nodules on CT 01/01/2020  . Atherosclerosis of aorta ([place]) 12/28/2019  . Chronic diarrhea  12/15/2019  . Hyperlipidemia associated with type 2 diabetes mellitus ([place]) 06/10/2019  . Chronic rhinitis 02/28/2018  . Tobacco use disorder 05/10/2017  . Hypertension with heart disease 05/10/2017  . Chest pain 03/10/2017  . Acute thoracic back pain   . Cardiac pacemaker in situ 12/24/2015  . History of Graves' disease 12/24/2015  . Current use of long term anticoagulation 12/24/2015  . Type 2 diabetes mellitus with other specified complication ([place])   . Hypothyroidism 03/23/2010  . Paroxysmal atrial fibrillation ([place]) 03/23/2010  . COPD (chronic obstructive  pulmonary disease) ([place]) 03/23/2010  . CAD (coronary artery disease), native coronary artery   . GERD     Goals Addressed   None    Reviewed chart for medication changes ahead of medication coordination call.  No OVs, Consults, or hospital visits since last care coordination call/Pharmacist visit. No medication changes indicated   BP Readings from Last 3 Encounters:  10/05/20 140/68  10/04/20 (!) 132/50  09/19/20 (!) 148/51    Lab Results  Component Value Date   HGBA1C 5.4 04/27/2020     [facility]. Clinical Pharmacist notified.    Follow-Up:  Pharmacist Review   Bessie [place] Pharmacist Assistant [phone]

## 2020-10-19 NOTE — Telephone Encounter (Signed)
Scheduled appt per 1/17 sch msg - pt daughter is aware of new appt date and time

## 2020-10-20 ENCOUNTER — Other Ambulatory Visit: Payer: Medicare Other

## 2020-10-20 ENCOUNTER — Ambulatory Visit: Payer: Medicare Other

## 2020-10-20 ENCOUNTER — Ambulatory Visit: Payer: Medicare Other | Admitting: Physician Assistant

## 2020-10-21 ENCOUNTER — Telehealth: Payer: Self-pay | Admitting: Internal Medicine

## 2020-10-21 NOTE — Telephone Encounter (Signed)
R/s appt per 1/20 sch msg - pt is aware of appt.

## 2020-10-22 ENCOUNTER — Other Ambulatory Visit: Payer: Self-pay | Admitting: Family Medicine

## 2020-10-24 NOTE — Progress Notes (Signed)
[facility] OFFICE PROGRESS NOTE  [name], Robin G, MD [place] [address]  DIAGNOSIS: Extensive stage (T2 a, N2, M1 C) small cell lung cancer presented with central right upper lobe lung mass in addition to left lower lobe pulmonary metastasis and bilateral hilar, subcarinal and bilateral paratracheal and left prevascular lymphadenopathy in addition to bone metastasis in the left iliac wing diagnosed in April 2021.  PRIOR THERAPY: None  CURRENT THERAPY: Systemic chemotherapy with carboplatin for AUC of 5 on day 1, etoposide 100 mg/M2 on days 1, 2 and 3 with Neulasta support in addition to Imfinzi 1500 mg IV every 3 weeks during chemotherapy followed by maintenance Imfinzi 1500 mg IV every 4 weeks after cycle #4.  Status post 10 cycles. She started maintenance immunotherapy with single agent Imfinzi starting from cycle #5  INTERVAL HISTORY: [name] 86 y.o. female returns to the clinic today for a follow-up visit.  The patient is feeling fairly well today without any concerning complaints. In the interval since her last appointment, the patient presented to the emergency room on 10/04/2020 with chief complaint of pelvis/abdominal pain that started after eating. The patient's pain resolved spontaneously and she left the emergency room before being seen.  Of note, the patient had a CT scan of the abdomen and pelvis last month which noted chronic mild prominence of the dorsal pancreatic duct in the head of the pancreas extending to the ampulla, without a well-defined causative lesion and mild mild sigmoid colon diverticulosis. The patient also has chronic abdominal cramping and intermittent diarrhea depending on what she eats. She had a follow up with her PCP with Dr. Sarajane Jews after her hospitalization.   She tolerated her last cycle of immunotherapy with Imfinzi  well without any concerning adverse side effects. She denies any significant cough or hemoptysis but she  reports her baseline dyspnea on exertion; however, this is greatly improved compared to when she first started treatment in which she was on supplemental oxygen. She denies any headache or visual changes. She denies any nausea or vomiting. She reports her baseline intermittent diarrhea. She reports dry skin on her back that itches. The patient is here today for evaluation before starting cycle #11.   MEDICAL HISTORY: Past Medical History:  Diagnosis Date  . CAD (coronary artery disease), native coronary artery    PTCA of distal right coronary artery 1997 [place]her stents to circumflex 2005 Cardiac cath in 2011 with patent stent to circumflex and moderate disease elsewhere treated medically   . Cancer ([place])   . Cardiac pacemaker in situ 12/24/2015   Original implant reportedly in 1991 for tachybradycardia syndrome, generator change in 1999, 2004 and evidently again in 2016 in [place]   . COPD (chronic obstructive pulmonary disease) ([place])   . Diabetes mellitus without complication ([place])   . Hypertension   . Hypothyroidism 03/23/2010  . Pacemaker   . Paroxysmal atrial fibrillation ([place]) 03/23/2010   CHA2DS2VASC score 5     ALLERGIES:  is allergic to seasonal ic [cholestatin].  MEDICATIONS:  Current Outpatient Medications  Medication Sig Dispense Refill  . acetaminophen (TYLENOL) 500 MG tablet Take 500 mg by mouth every 6 (six) hours as needed for headache.    . albuterol (VENTOLIN HFA) 108 (90 Base) MCG/ACT inhaler TAKE 2 PUFFS BY MOUTH EVERY 6 HOURS AS NEEDED FOR WHEEZE OR SHORTNESS OF BREATH 1 each 1  . atorvastatin (LIPITOR) 10 MG tablet Take 1 tablet (10 mg total) by mouth daily.  Please make yearly appt with Dr. Tamala Julian for November for future refills. Thank you 1st attempt 90 tablet 0  . carvedilol (COREG) 12.5 MG tablet TAKE 1 TABLET (12.5 MG TOTAL) BY MOUTH 2 (TWO) TIMES DAILY WITH A MEAL. 60 tablet 0  . diclofenac sodium (VOLTAREN) 1 % GEL Apply 4 [name] topically 4 (four) times daily. 4 Tube  3  . doxepin (SINEQUAN) 10 MG/ML solution Take 0.3 mLs (3 mg total) by mouth at bedtime. 30 mL 1  . ELIQUIS 2.5 MG TABS tablet TAKE 1 TABLET BY MOUTH TWICE A DAY 60 tablet 5  . fenofibrate (TRICOR) 48 MG tablet TAKE 1 TABLET BY MOUTH EVERY DAY (Patient taking differently: Take 48 mg by mouth daily after lunch.) 90 tablet 3  . glucose blood (ONETOUCH VERIO) test strip Use to test 3-4 times daily. 300 strip 4  . KLOR-CON M20 20 MEQ tablet TAKE 1 TABLET BY MOUTH TWICE A DAY FOR 2 DAYS THEN TAKE 1 TABLET BY MOUTH EVERY DAY 90 tablet 1  . levothyroxine (SYNTHROID) 112 MCG tablet TAKE 1 TABLET (112 MCG TOTAL) BY MOUTH DAILY BEFORE BREAKFAST. 90 tablet 2  . lidocaine-prilocaine (EMLA) cream Apply 1 application topically as needed. 30 [name] 2  . linagliptin (TRADJENTA) 5 MG TABS tablet TAKE 1 TABLET (5 MG TOTAL) DAILY BY MOUTH. 90 tablet 1  . Loperamide HCl (IMODIUM PO) Take by mouth as needed.    Marland Kitchen losartan (COZAAR) 50 MG tablet TAKE 2 TABLETS BY MOUTH EVERY DAY 180 tablet 0  . nitroGLYCERIN (NITROSTAT) 0.4 MG SL tablet Place 1 tablet (0.4 mg total) under the tongue every 5 (five) minutes x 3 doses as needed for chest pain (Max 3 doses within 15 min. Call 911). 10 tablet 0  . oxyCODONE-acetaminophen (PERCOCET/ROXICET) 5-325 MG tablet Take 1 tablet by mouth every 8 (eight) hours as needed for severe pain. 20 tablet 0  . pantoprazole (PROTONIX) 40 MG tablet TAKE 1 TABLET BY MOUTH EVERY DAY 90 tablet 1  . prochlorperazine (COMPAZINE) 10 MG tablet Take 1 tablet (10 mg total) by mouth every 6 (six) hours as needed for nausea or vomiting. 30 tablet 1   No current facility-administered medications for this visit.    SURGICAL HISTORY:  Past Surgical History:  Procedure Laterality Date  . BIOPSY  01/08/2020   Procedure: BIOPSY;  Surgeon: Garner Nash, DO;  Location: [facility];  Service: Pulmonary;;  . BRONCHIAL BRUSHINGS  01/08/2020   Procedure: BRONCHIAL BRUSHINGS;  Surgeon: Garner Nash, DO;  Location:  [facility];  Service: Pulmonary;;  . BRONCHIAL WASHINGS  01/08/2020   Procedure: BRONCHIAL WASHINGS;  Surgeon: Garner Nash, DO;  Location: [facility];  Service: Pulmonary;;  . CARDIAC CATHETERIZATION N/A 12/26/2015   Procedure: Left Heart Cath and Coronary Angiography;  Surgeon: Peter M [name], MD;  Location: [facility];  Service: Cardiovascular;  Laterality: N/A;  . CARDIOVERSION  05/2015  . CHOLECYSTECTOMY  2012  . CORONARY ANGIOPLASTY WITH STENT PLACEMENT  1990  . ENDOBRONCHIAL ULTRASOUND  01/08/2020   Procedure: ENDOBRONCHIAL ULTRASOUND;  Surgeon: Garner Nash, DO;  Location: [facility];  Service: Pulmonary;;  . FINE NEEDLE ASPIRATION BIOPSY  01/08/2020   Procedure: FINE NEEDLE ASPIRATION BIOPSY;  Surgeon: Garner Nash, DO;  Location: [facility];  Service: Pulmonary;;  . HERNIA REPAIR    . IR IMAGING GUIDED PORT INSERTION  02/18/2020  . PACEMAKER GENERATOR CHANGE  [id number], 2016  . PACEMAKER INSERTION  1991  . VIDEO BRONCHOSCOPY  WITH ENDOBRONCHIAL ULTRASOUND N/A 01/08/2020   Procedure: VIDEO BRONCHOSCOPY;  Surgeon: Garner Nash, DO;  Location: [facility];  Service: Pulmonary;  Laterality: N/A;    REVIEW OF SYSTEMS:   Review of Systems  Constitutional: Negative for appetite change, chills, fatigue, fever and unexpected weight change.  HENT: Negative for mouth sores, nosebleeds, sore throat and trouble swallowing.   Eyes: Negative for eye problems and icterus.  Respiratory: Positive for baseline mild dyspnea on exertion. Negative for cough, hemoptysis, and wheezing.   Cardiovascular: Negative for chest pain and leg swelling.  Gastrointestinal: Positive for chronic diarrhea. Negative for abdominal pain, constipation, nausea and vomiting.  Genitourinary: Negative for bladder incontinence, difficulty urinating, dysuria, frequency and hematuria.   Musculoskeletal: Positive for intermittent right knee pain. Negative for back pain, gait problem, neck pain and neck  stiffness.  Skin: positive for itching and dry skin on her back.  Neurological: Negative for dizziness, extremity weakness, gait problem, headaches, light-headedness and seizures.  Hematological: Negative for adenopathy. Does not bruise/bleed easily.  Psychiatric/Behavioral: Negative for confusion, depression and sleep disturbance. The patient is not nervous/anxious.     PHYSICAL EXAMINATION:  Blood pressure (!) 131/44, pulse 71, temperature (!) 97.3 F (36.3 C), temperature source Tympanic, resp. rate 16, height 5\' 2"  (1.575 m), weight 105 lb 11.2 oz (47.9 kg), SpO2 100 %.  ECOG PERFORMANCE STATUS: 1 - Symptomatic but completely ambulatory  Physical Exam  Constitutional: Oriented to person, place, and time and thin appearing female and in no distress.  HENT:  Head: Normocephalic and atraumatic.  Mouth/Throat: Oropharynx is clear and moist. No oropharyngeal exudate.  Eyes: Conjunctivae are normal. Right eye exhibits no discharge. Left eye exhibits no discharge. No scleral icterus.  Neck: Normal range of motion. Neck supple.  Cardiovascular: Normal rate, regular rhythm, normal heart sounds and intact distal pulses.   Pulmonary/Chest: Effort normal. Quiet breath sounds in all lung fields. No respiratory distress. No wheezes. No rales.  Abdominal: Soft. Bowel sounds are normal. Exhibits no distension and no mass.  Musculoskeletal: Normal range of motion. Exhibits no edema.  Lymphadenopathy:    No cervical adenopathy.  Neurological: Alert and oriented to person, place, and time. Exhibits normal muscle tone. Gait normal. Coordination normal.  Skin: Skin is warm and dry. No rash noted. Not diaphoretic. No erythema. No pallor.  Psychiatric: Mood, memory and judgment normal.  Vitals reviewed.  LABORATORY DATA: Lab Results  Component Value Date   WBC 4.4 10/27/2020   HGB 10.2 (L) 10/27/2020   HCT 33.1 (L) 10/27/2020   MCV 96.5 10/27/2020   PLT 158 10/27/2020      Chemistry       Component Value Date/Time   NA 141 10/27/2020 1100   NA 143 03/17/2020 0915   K 4.1 10/27/2020 1100   CL 111 10/27/2020 1100   CO2 25 10/27/2020 1100   BUN 15 10/27/2020 1100   BUN 6 (L) 03/17/2020 0915   CREATININE 0.72 10/27/2020 1100      Component Value Date/Time   CALCIUM 8.9 10/27/2020 1100   ALKPHOS 82 10/27/2020 1100   AST 19 10/27/2020 1100   ALT 17 10/27/2020 1100   BILITOT 0.6 10/27/2020 1100       RADIOGRAPHIC STUDIES:  No results found.   ASSESSMENT/PLAN:  This is a very pleasant 86 year old african [place] female with extensive stage small cell lung cancer. She presented with a central right upper lobe lung mass in addition to left lower lobe nodule and mediastinal lymphadenopathy  and a metastatic bone lesion in the left iliac wing. She was diagnosed in April 2021.    The patient is currently undergoing treatment with palliative systemic chemotherapy with carboplatin for an AUC of 5 on day 1, etoposide 100 mg/m2 on days 1, 2, and 3 and Imfinzi 1500 mg every 3 weeks. Starting from cycle #5, the patient has been on maintenance immunotherapy with Imfinzi. She is status post 10 cycles.    Labs were reviewed. Recommend that she proceed with cycle #11 today as scheduled.   We will see her back for a follow up visit in 4 weeks for evaluation before starting cycle #12.   She mentions she started having mild right knee pain with certain positions at night. She denies trauma or injuries. Advised her to take tylenol if needed and follow up with her PCP if worsening symptoms or if it fails to improve.   Regarding the dry skin on her back and itching. Advised her to use lotion. For itching, advised she can use hydrocortisone cream.  The patient was inquiring about a form to give consent for [place] to discuss her medical condition with her daughter, [name]. A consent form was provided. She is also interested in setting up a medical power of attorney. We will reach out to social  work to see if they can help assist her with this process. She was given brochures regarding this.    The patient was advised to call immediately if she has any concerning symptoms in the interval. The patient voices understanding of current disease status and treatment options and is in agreement with the current care plan. All questions were answered. The patient knows to call the clinic with any problems, questions or concerns. We can certainly see the patient much sooner if necessary   No orders of the defined types were placed in this encounter.    I spent 20-29 minutes in this encounter  Neya Creegan L Rayette Mogg, PA-C 10/27/20

## 2020-10-27 ENCOUNTER — Encounter: Payer: Self-pay | Admitting: Physician Assistant

## 2020-10-27 ENCOUNTER — Encounter: Payer: Self-pay | Admitting: Licensed Clinical Social Worker

## 2020-10-27 ENCOUNTER — Other Ambulatory Visit: Payer: Self-pay

## 2020-10-27 ENCOUNTER — Inpatient Hospital Stay: Payer: Medicaid Other

## 2020-10-27 ENCOUNTER — Inpatient Hospital Stay (HOSPITAL_BASED_OUTPATIENT_CLINIC_OR_DEPARTMENT_OTHER): Payer: Medicaid Other | Admitting: Physician Assistant

## 2020-10-27 ENCOUNTER — Inpatient Hospital Stay: Payer: Medicaid Other | Attending: Internal Medicine

## 2020-10-27 VITALS — BP 131/44 | HR 71 | Temp 97.3°F | Resp 16 | Ht 62.0 in | Wt 105.7 lb

## 2020-10-27 DIAGNOSIS — C7951 Secondary malignant neoplasm of bone: Secondary | ICD-10-CM | POA: Diagnosis not present

## 2020-10-27 DIAGNOSIS — I10 Essential (primary) hypertension: Secondary | ICD-10-CM | POA: Insufficient documentation

## 2020-10-27 DIAGNOSIS — Z95828 Presence of other vascular implants and grafts: Secondary | ICD-10-CM | POA: Diagnosis not present

## 2020-10-27 DIAGNOSIS — Z5112 Encounter for antineoplastic immunotherapy: Secondary | ICD-10-CM | POA: Diagnosis not present

## 2020-10-27 DIAGNOSIS — E039 Hypothyroidism, unspecified: Secondary | ICD-10-CM | POA: Diagnosis not present

## 2020-10-27 DIAGNOSIS — C3491 Malignant neoplasm of unspecified part of right bronchus or lung: Secondary | ICD-10-CM

## 2020-10-27 DIAGNOSIS — C3411 Malignant neoplasm of upper lobe, right bronchus or lung: Secondary | ICD-10-CM | POA: Diagnosis present

## 2020-10-27 LAB — CBC WITH DIFFERENTIAL (CANCER CENTER ONLY)
Abs Immature Granulocytes: 0.02 10*3/uL (ref 0.00–0.07)
Basophils Absolute: 0 10*3/uL (ref 0.0–0.1)
Basophils Relative: 1 %
Eosinophils Absolute: 0.1 10*3/uL (ref 0.0–0.5)
Eosinophils Relative: 3 %
HCT: 33.1 % — ABNORMAL LOW (ref 36.0–46.0)
Hemoglobin: 10.2 g/dL — ABNORMAL LOW (ref 12.0–15.0)
Immature Granulocytes: 1 %
Lymphocytes Relative: 38 %
Lymphs Abs: 1.7 10*3/uL (ref 0.7–4.0)
MCH: 29.7 pg (ref 26.0–34.0)
MCHC: 30.8 g/dL (ref 30.0–36.0)
MCV: 96.5 fL (ref 80.0–100.0)
Monocytes Absolute: 0.6 10*3/uL (ref 0.1–1.0)
Monocytes Relative: 14 %
Neutro Abs: 1.9 10*3/uL (ref 1.7–7.7)
Neutrophils Relative %: 43 %
Platelet Count: 158 10*3/uL (ref 150–400)
RBC: 3.43 MIL/uL — ABNORMAL LOW (ref 3.87–5.11)
RDW: 15.4 % (ref 11.5–15.5)
WBC Count: 4.4 10*3/uL (ref 4.0–10.5)
nRBC: 0 % (ref 0.0–0.2)

## 2020-10-27 LAB — CMP (CANCER CENTER ONLY)
ALT: 17 U/L (ref 0–44)
AST: 19 U/L (ref 15–41)
Albumin: 3.3 g/dL — ABNORMAL LOW (ref 3.5–5.0)
Alkaline Phosphatase: 82 U/L (ref 38–126)
Anion gap: 5 (ref 5–15)
BUN: 15 mg/dL (ref 8–23)
CO2: 25 mmol/L (ref 22–32)
Calcium: 8.9 mg/dL (ref 8.9–10.3)
Chloride: 111 mmol/L (ref 98–111)
Creatinine: 0.72 mg/dL (ref 0.44–1.00)
GFR, Estimated: >60 mL/min (ref >60)
Glucose, Bld: 89 mg/dL (ref 70–99)
Potassium: 4.1 mmol/L (ref 3.5–5.1)
Sodium: 141 mmol/L (ref 135–145)
Total Bilirubin: 0.6 mg/dL (ref 0.3–1.2)
Total Protein: 6.4 g/dL — ABNORMAL LOW (ref 6.5–8.1)

## 2020-10-27 LAB — TSH: TSH: 0.431 u[IU]/mL (ref 0.308–3.960)

## 2020-10-27 MED ORDER — SODIUM CHLORIDE 0.9% FLUSH
10.0000 mL | INTRAVENOUS | Status: DC | PRN
  Administered 2020-10-27: 10 mL
  Filled 2020-10-27: qty 10

## 2020-10-27 MED ORDER — SODIUM CHLORIDE 0.9 % IV SOLN
1500.0000 mg | Freq: Once | INTRAVENOUS | Status: AC
  Administered 2020-10-27: 1500 mg via INTRAVENOUS
  Filled 2020-10-27: qty 30

## 2020-10-27 MED ORDER — HEPARIN SOD (PORK) LOCK FLUSH 100 UNIT/ML IV SOLN
500.0000 [IU] | Freq: Once | INTRAVENOUS | Status: AC | PRN
  Administered 2020-10-27: 500 [IU]
  Filled 2020-10-27: qty 5

## 2020-10-27 MED ORDER — SODIUM CHLORIDE 0.9 % IV SOLN
Freq: Once | INTRAVENOUS | Status: AC
  Filled 2020-10-27: qty 250

## 2020-10-27 MED ORDER — SODIUM CHLORIDE 0.9% FLUSH
10.0000 mL | Freq: Once | INTRAVENOUS | Status: AC
  Administered 2020-10-27: 10 mL
  Filled 2020-10-27: qty 10

## 2020-10-27 NOTE — Patient Instructions (Signed)
[facility] Discharge Instructions for Patients Receiving Chemotherapy  Today you received the following chemotherapy agents: durvalumab.  To help prevent nausea and vomiting after your treatment, we encourage you to take your nausea medication as directed.   If you develop nausea and vomiting that is not controlled by your nausea medication, call the clinic.   BELOW ARE SYMPTOMS THAT SHOULD BE REPORTED IMMEDIATELY:  *FEVER GREATER THAN 100.5 F  *CHILLS WITH OR WITHOUT FEVER  NAUSEA AND VOMITING THAT IS NOT CONTROLLED WITH YOUR NAUSEA MEDICATION  *UNUSUAL SHORTNESS OF BREATH  *UNUSUAL BRUISING OR BLEEDING  TENDERNESS IN MOUTH AND THROAT WITH OR WITHOUT PRESENCE OF ULCERS  *URINARY PROBLEMS  *BOWEL PROBLEMS  UNUSUAL RASH Items with * indicate a potential emergency and should be followed up as soon as possible.  Feel free to call the clinic should you have any questions or concerns. The clinic phone number is [phone].  Please show the CHEMO ALERT CARD at check-in to the Emergency Department and triage nurse.   

## 2020-10-27 NOTE — Progress Notes (Signed)
[name]  CSW met patient in infusion per request from RN to discuss HCPOA.  Per patient, her daughter has questions and she would like both of them to have the information together. CSW provided information on upcoming advanced directives clinic that she and her daughter can sign up for to have their questions answered and complete paperwork.   Christeen Douglas, LCSW

## 2020-10-28 ENCOUNTER — Telehealth: Payer: Self-pay | Admitting: Physician Assistant

## 2020-10-28 NOTE — Telephone Encounter (Signed)
Scheduled appointments per 1/27 los. Called patient, no answer. Left message with appointments dates and times.

## 2020-11-07 ENCOUNTER — Telehealth: Payer: Self-pay

## 2020-11-07 DIAGNOSIS — E890 Postprocedural hypothyroidism: Secondary | ICD-10-CM

## 2020-11-07 MED ORDER — LEVOTHYROXINE SODIUM 112 MCG PO TABS: 112.0000 ug | ORAL_TABLET | Freq: Every day | ORAL | 2 refills | Status: DC

## 2020-11-07 NOTE — Telephone Encounter (Signed)
Patient called in needing a refill on Rx and also wanting to switch her pharmacy    levothyroxine (SYNTHROID) 112 MCG tablet  [id number] [place], [place]

## 2020-11-07 NOTE — Telephone Encounter (Signed)
Rx sent in to new pharmacy as requested.

## 2020-11-09 ENCOUNTER — Ambulatory Visit (INDEPENDENT_AMBULATORY_CARE_PROVIDER_SITE_OTHER): Payer: Medicare Other

## 2020-11-09 ENCOUNTER — Other Ambulatory Visit: Payer: Self-pay | Admitting: Family Medicine

## 2020-11-09 DIAGNOSIS — I495 Sick sinus syndrome: Secondary | ICD-10-CM

## 2020-11-11 ENCOUNTER — Encounter: Payer: Medicare Other | Admitting: Cardiology

## 2020-11-11 LAB — CUP PACEART REMOTE DEVICE CHECK
Battery Remaining Longevity: 24 mo
Battery Remaining Percentage: 40 %
Brady Statistic RA Percent Paced: 10 %
Brady Statistic RV Percent Paced: 34 %
Date Time Interrogation Session: 20220209021100
Implantable Lead Implant Date: 19990610
Implantable Lead Implant Date: 20160713
Implantable Lead Location: 753859
Implantable Lead Location: 753860
Implantable Lead Model: 4136
Implantable Lead Serial Number: 29813363
Implantable Pulse Generator Implant Date: 20150130
Lead Channel Impedance Value: 348 Ohm
Lead Channel Impedance Value: 559 Ohm
Lead Channel Pacing Threshold Amplitude: 0.8 V
Lead Channel Pacing Threshold Amplitude: 1.1 V
Lead Channel Pacing Threshold Pulse Width: 0.4 ms
Lead Channel Pacing Threshold Pulse Width: 0.5 ms
Lead Channel Setting Pacing Amplitude: 1.2 V
Lead Channel Setting Pacing Amplitude: 2 V
Lead Channel Setting Pacing Pulse Width: 0.4 ms
Lead Channel Setting Sensing Sensitivity: 2 mV
Pulse Gen Serial Number: 393477

## 2020-11-14 ENCOUNTER — Encounter: Payer: Medicare Other | Admitting: Nutrition

## 2020-11-14 ENCOUNTER — Ambulatory Visit: Payer: Medicare Other | Admitting: Internal Medicine

## 2020-11-14 ENCOUNTER — Ambulatory Visit: Payer: Medicare Other

## 2020-11-14 ENCOUNTER — Other Ambulatory Visit: Payer: Medicare Other

## 2020-11-15 NOTE — Progress Notes (Signed)
Remote pacemaker transmission.   

## 2020-11-21 ENCOUNTER — Other Ambulatory Visit: Payer: Self-pay | Admitting: Family Medicine

## 2020-11-24 ENCOUNTER — Inpatient Hospital Stay (HOSPITAL_BASED_OUTPATIENT_CLINIC_OR_DEPARTMENT_OTHER): Payer: Medicare Other | Admitting: Internal Medicine

## 2020-11-24 ENCOUNTER — Other Ambulatory Visit: Payer: Self-pay

## 2020-11-24 ENCOUNTER — Inpatient Hospital Stay: Payer: Medicare Other

## 2020-11-24 ENCOUNTER — Inpatient Hospital Stay: Payer: Medicare Other | Attending: Internal Medicine

## 2020-11-24 ENCOUNTER — Inpatient Hospital Stay: Payer: Medicare Other | Admitting: Nutrition

## 2020-11-24 VITALS — BP 142/52 | HR 68 | Temp 97.5°F | Resp 14 | Ht 62.0 in | Wt 109.2 lb

## 2020-11-24 DIAGNOSIS — C3490 Malignant neoplasm of unspecified part of unspecified bronchus or lung: Secondary | ICD-10-CM

## 2020-11-24 DIAGNOSIS — C3491 Malignant neoplasm of unspecified part of right bronchus or lung: Secondary | ICD-10-CM

## 2020-11-24 DIAGNOSIS — Z5112 Encounter for antineoplastic immunotherapy: Secondary | ICD-10-CM | POA: Diagnosis not present

## 2020-11-24 DIAGNOSIS — C3411 Malignant neoplasm of upper lobe, right bronchus or lung: Secondary | ICD-10-CM | POA: Insufficient documentation

## 2020-11-24 DIAGNOSIS — Z95828 Presence of other vascular implants and grafts: Secondary | ICD-10-CM

## 2020-11-24 DIAGNOSIS — C7951 Secondary malignant neoplasm of bone: Secondary | ICD-10-CM | POA: Insufficient documentation

## 2020-11-24 DIAGNOSIS — Z79899 Other long term (current) drug therapy: Secondary | ICD-10-CM | POA: Diagnosis not present

## 2020-11-24 LAB — CMP (CANCER CENTER ONLY)
ALT: 28 U/L (ref 0–44)
AST: 23 U/L (ref 15–41)
Albumin: 3.3 g/dL — ABNORMAL LOW (ref 3.5–5.0)
Alkaline Phosphatase: 78 U/L (ref 38–126)
Anion gap: 6 (ref 5–15)
BUN: 17 mg/dL (ref 8–23)
CO2: 25 mmol/L (ref 22–32)
Calcium: 9.1 mg/dL (ref 8.9–10.3)
Chloride: 110 mmol/L (ref 98–111)
Creatinine: 0.71 mg/dL (ref 0.44–1.00)
GFR, Estimated: >60 mL/min (ref >60)
Glucose, Bld: 81 mg/dL (ref 70–99)
Potassium: 4.2 mmol/L (ref 3.5–5.1)
Sodium: 141 mmol/L (ref 135–145)
Total Bilirubin: 0.4 mg/dL (ref 0.3–1.2)
Total Protein: 6.3 g/dL — ABNORMAL LOW (ref 6.5–8.1)

## 2020-11-24 LAB — CBC WITH DIFFERENTIAL (CANCER CENTER ONLY)
Abs Immature Granulocytes: 0.01 10*3/uL (ref 0.00–0.07)
Basophils Absolute: 0 10*3/uL (ref 0.0–0.1)
Basophils Relative: 1 %
Eosinophils Absolute: 0.1 10*3/uL (ref 0.0–0.5)
Eosinophils Relative: 2 %
HCT: 30.7 % — ABNORMAL LOW (ref 36.0–46.0)
Hemoglobin: 9.6 g/dL — ABNORMAL LOW (ref 12.0–15.0)
Immature Granulocytes: 0 %
Lymphocytes Relative: 37 %
Lymphs Abs: 1.6 10*3/uL (ref 0.7–4.0)
MCH: 30.3 pg (ref 26.0–34.0)
MCHC: 31.3 g/dL (ref 30.0–36.0)
MCV: 96.8 fL (ref 80.0–100.0)
Monocytes Absolute: 0.6 10*3/uL (ref 0.1–1.0)
Monocytes Relative: 15 %
Neutro Abs: 1.8 10*3/uL (ref 1.7–7.7)
Neutrophils Relative %: 45 %
Platelet Count: 143 10*3/uL — ABNORMAL LOW (ref 150–400)
RBC: 3.17 MIL/uL — ABNORMAL LOW (ref 3.87–5.11)
RDW: 15.2 % (ref 11.5–15.5)
WBC Count: 4.2 10*3/uL (ref 4.0–10.5)
nRBC: 0 % (ref 0.0–0.2)

## 2020-11-24 LAB — TSH: TSH: 0.474 u[IU]/mL (ref 0.308–3.960)

## 2020-11-24 MED ORDER — SODIUM CHLORIDE 0.9 % IV SOLN
Freq: Once | INTRAVENOUS | Status: AC
  Filled 2020-11-24: qty 250

## 2020-11-24 MED ORDER — SODIUM CHLORIDE 0.9 % IV SOLN
1500.0000 mg | Freq: Once | INTRAVENOUS | Status: AC
Start: 2020-11-24 — End: 2020-11-24
  Administered 2020-11-24: 1500 mg via INTRAVENOUS
  Filled 2020-11-24: qty 30

## 2020-11-24 MED ORDER — HEPARIN SOD (PORK) LOCK FLUSH 100 UNIT/ML IV SOLN
500.0000 [IU] | Freq: Once | INTRAVENOUS | Status: AC | PRN
  Administered 2020-11-24: 500 [IU]
  Filled 2020-11-24: qty 5

## 2020-11-24 MED ORDER — SODIUM CHLORIDE 0.9% FLUSH
10.0000 mL | INTRAVENOUS | Status: DC | PRN
  Administered 2020-11-24: 10 mL
  Filled 2020-11-24: qty 10

## 2020-11-24 MED ORDER — SODIUM CHLORIDE 0.9% FLUSH
10.0000 mL | Freq: Once | INTRAVENOUS | Status: AC
  Administered 2020-11-24: 10 mL
  Filled 2020-11-24: qty 10

## 2020-11-24 NOTE — Progress Notes (Signed)
Nutrition follow-up completed with patient during infusion for small cell lung cancer. Weight documented as 109.2 pounds on February 24 increased from 105.7 pounds January 27. Patient reports she is eating quite well.  She has a good appetite.  She enjoys boost supplements but cannot always find the one she enjoys.  She declines coupons at this time as she still has some left from our last visit. She denies nutrition impact symptoms.  Nutrition diagnosis: Unintended weight loss improved.  Intervention: Provided support and encouragement to patient to continue strategies for increased calories and protein. Encouraged boost supplements as needed. Encourage patient to contact me with questions or concerns.  Monitoring, evaluation, goals: Patient will tolerate increased calories and protein to minimize weight loss.  Next visit: To be scheduled if needed.  **Disclaimer: This note was dictated with voice recognition software. Similar sounding words can inadvertently be transcribed and this note may contain transcription errors which may not have been corrected upon publication of note.**

## 2020-11-24 NOTE — Patient Instructions (Signed)
[facility] Discharge Instructions for Patients Receiving Chemotherapy  Today you received the following chemotherapy agents: durvalumab.  To help prevent nausea and vomiting after your treatment, we encourage you to take your nausea medication as directed.   If you develop nausea and vomiting that is not controlled by your nausea medication, call the clinic.   BELOW ARE SYMPTOMS THAT SHOULD BE REPORTED IMMEDIATELY:  *FEVER GREATER THAN 100.5 F  *CHILLS WITH OR WITHOUT FEVER  NAUSEA AND VOMITING THAT IS NOT CONTROLLED WITH YOUR NAUSEA MEDICATION  *UNUSUAL SHORTNESS OF BREATH  *UNUSUAL BRUISING OR BLEEDING  TENDERNESS IN MOUTH AND THROAT WITH OR WITHOUT PRESENCE OF ULCERS  *URINARY PROBLEMS  *BOWEL PROBLEMS  UNUSUAL RASH Items with * indicate a potential emergency and should be followed up as soon as possible.  Feel free to call the clinic should you have any questions or concerns. The clinic phone number is [phone].  Please show the CHEMO ALERT CARD at check-in to the Emergency Department and triage nurse.   

## 2020-11-24 NOTE — Progress Notes (Signed)
[place] [phone]   [phone]  OFFICE PROGRESS NOTE  [name], [name], MD [address] [place] [address]  DIAGNOSIS: Extensive stage (T2 a, N2, M1 C) small cell lung cancer presented with central right upper lobe lung mass in addition to left lower lobe pulmonary metastasis and bilateral hilar, subcarinal and bilateral paratracheal and left prevascular lymphadenopathy in addition to bone metastasis in the left iliac wing diagnosed in April 2021.  PRIOR THERAPY: None  CURRENT THERAPY: Systemic chemotherapy with carboplatin for AUC of 5 on day 1, etoposide 100 mg/M2 on days 1, 2 and 3 with Neulasta support in addition to Imfinzi 1500 mg IV every 3 weeks during chemotherapy followed by maintenance Imfinzi 1500 mg IV every 4 weeks after cycle #4.  Status post 11 cycles.  INTERVAL HISTORY: [name] 86 y.o. female returns to the clinic today for follow-up visit accompanied by her daughter.  The patient is feeling fine today with no concerning complaints except for mild fatigue.  She denied having any chest pain, shortness of breath, cough or hemoptysis.  She denied having any fever or chills.  She has no nausea, vomiting, diarrhea or constipation.  She denied having any headache or visual changes.  She has no recent weight loss or night sweats.  She is here today for evaluation before starting cycle #12 of her treatment.   MEDICAL HISTORY: Past Medical History:  Diagnosis Date  . CAD (coronary artery disease), native coronary artery    PTCA of distal right coronary artery 1997 Cypher stents to circumflex 2005 Cardiac cath in 2011 with patent stent to circumflex and moderate disease elsewhere treated medically   . Cancer ([place])   . Cardiac pacemaker in situ 12/24/2015   Original implant reportedly in 1991 for tachybradycardia syndrome, generator change in 1999, 2004 and evidently again in 2016 in [place]   . COPD (chronic obstructive  pulmonary disease) ([place])   . Diabetes mellitus without complication ([place])   . Hypertension   . Hypothyroidism 03/23/2010  . Pacemaker   . Paroxysmal atrial fibrillation ([place]) 03/23/2010   CHA2DS2VASC score 5     ALLERGIES:  is allergic to seasonal ic [cholestatin].  MEDICATIONS:  Current Outpatient Medications  Medication Sig Dispense Refill  . acetaminophen (TYLENOL) 500 MG tablet Take 500 mg by mouth every 6 (six) hours as needed for headache.    . albuterol (VENTOLIN HFA) 108 (90 Base) MCG/ACT inhaler TAKE 2 PUFFS BY MOUTH EVERY 6 HOURS AS NEEDED FOR WHEEZE OR SHORTNESS OF BREATH 1 each 1  . atorvastatin (LIPITOR) 10 MG tablet Take 1 tablet (10 mg total) by mouth daily. Please make yearly appt with Dr. Tamala Julian for November for future refills. Thank you 1st attempt 90 tablet 0  . carvedilol (COREG) 12.5 MG tablet TAKE 1 TABLET (12.5 MG TOTAL) BY MOUTH 2 (TWO) TIMES DAILY WITH A MEAL. 60 tablet 0  . diclofenac sodium (VOLTAREN) 1 % GEL Apply 4 g topically 4 (four) times daily. 4 Tube 3  . doxepin (SINEQUAN) 10 MG/ML solution Take 0.3 mLs (3 mg total) by mouth at bedtime. 30 mL 1  . ELIQUIS 2.5 MG TABS tablet TAKE 1 TABLET BY MOUTH TWICE A DAY 60 tablet 5  . fenofibrate (TRICOR) 48 MG tablet TAKE 1 TABLET BY MOUTH EVERY DAY 90 tablet 3  . glucose blood (ONETOUCH VERIO) test strip Use to test 3-4 times daily. 300 strip 4  . KLOR-CON M20 20  MEQ tablet TAKE 1 TABLET BY MOUTH TWICE A DAY FOR 2 DAYS THEN TAKE 1 TABLET BY MOUTH EVERY DAY 90 tablet 1  . levothyroxine (SYNTHROID) 112 MCG tablet Take 1 tablet (112 mcg total) by mouth daily before breakfast. 90 tablet 2  . lidocaine-prilocaine (EMLA) cream Apply 1 application topically as needed. 30 g 2  . linagliptin (TRADJENTA) 5 MG TABS tablet TAKE 1 TABLET (5 MG TOTAL) DAILY BY MOUTH. 90 tablet 1  . Loperamide HCl (IMODIUM PO) Take by mouth as needed.    Marland Kitchen losartan (COZAAR) 50 MG tablet TAKE 2 TABLETS BY MOUTH EVERY DAY 180 tablet 0  .  nitroGLYCERIN (NITROSTAT) 0.4 MG SL tablet Place 1 tablet (0.4 mg total) under the tongue every 5 (five) minutes x 3 doses as needed for chest pain (Max 3 doses within 15 min. Call 911). 10 tablet 0  . oxyCODONE-acetaminophen (PERCOCET/ROXICET) 5-325 MG tablet Take 1 tablet by mouth every 8 (eight) hours as needed for severe pain. 20 tablet 0  . pantoprazole (PROTONIX) 40 MG tablet TAKE 1 TABLET BY MOUTH EVERY DAY 90 tablet 1  . prochlorperazine (COMPAZINE) 10 MG tablet Take 1 tablet (10 mg total) by mouth every 6 (six) hours as needed for nausea or vomiting. 30 tablet 1   No current facility-administered medications for this visit.    SURGICAL HISTORY:  Past Surgical History:  Procedure Laterality Date  . BIOPSY  01/08/2020   Procedure: BIOPSY;  Surgeon: Garner Nash, DO;  Location: [facility];  Service: Pulmonary;;  . BRONCHIAL BRUSHINGS  01/08/2020   Procedure: BRONCHIAL BRUSHINGS;  Surgeon: Garner Nash, DO;  Location: [facility];  Service: Pulmonary;;  . BRONCHIAL WASHINGS  01/08/2020   Procedure: BRONCHIAL WASHINGS;  Surgeon: Garner Nash, DO;  Location: [facility];  Service: Pulmonary;;  . CARDIAC CATHETERIZATION N/A 12/26/2015   Procedure: Left Heart Cath and Coronary Angiography;  Surgeon: Peter M [name], MD;  Location: [facility];  Service: Cardiovascular;  Laterality: N/A;  . CARDIOVERSION  05/2015  . CHOLECYSTECTOMY  2012  . CORONARY ANGIOPLASTY WITH STENT PLACEMENT  1990  . ENDOBRONCHIAL ULTRASOUND  01/08/2020   Procedure: ENDOBRONCHIAL ULTRASOUND;  Surgeon: Garner Nash, DO;  Location: [facility];  Service: Pulmonary;;  . FINE NEEDLE ASPIRATION BIOPSY  01/08/2020   Procedure: FINE NEEDLE ASPIRATION BIOPSY;  Surgeon: Garner Nash, DO;  Location: [facility];  Service: Pulmonary;;  . HERNIA REPAIR    . IR IMAGING GUIDED PORT INSERTION  02/18/2020  . PACEMAKER GENERATOR CHANGE  [id number], 2016  . PACEMAKER INSERTION  1991  . VIDEO BRONCHOSCOPY WITH  ENDOBRONCHIAL ULTRASOUND N/A 01/08/2020   Procedure: VIDEO BRONCHOSCOPY;  Surgeon: Garner Nash, DO;  Location: [facility];  Service: Pulmonary;  Laterality: N/A;    REVIEW OF SYSTEMS:  A comprehensive review of systems was negative except for: Constitutional: positive for fatigue   PHYSICAL EXAMINATION: General appearance: alert, cooperative, appears stated age, fatigued and no distress Head: Normocephalic, without obvious abnormality, atraumatic Neck: no adenopathy, no JVD, supple, symmetrical, trachea midline and thyroid not enlarged, symmetric, no tenderness/mass/nodules Lymph nodes: Cervical, supraclavicular, and axillary nodes normal. Resp: clear to auscultation bilaterally Back: symmetric, no curvature. ROM normal. No CVA tenderness. Cardio: regular rate and rhythm, S1, S2 normal, no murmur, click, rub or gallop GI: soft, non-tender; bowel sounds normal; no masses,  no organomegaly Extremities: extremities normal, atraumatic, no cyanosis or edema  ECOG PERFORMANCE STATUS: 1 - Symptomatic but completely ambulatory  Blood pressure Marland Kitchen)  142/52, pulse 68, temperature (!) 97.5 F (36.4 C), temperature source Tympanic, resp. rate 14, height 5\' 2"  (1.575 m), weight 109 lb 3.2 oz (49.5 kg), SpO2 100 %.  LABORATORY DATA: Lab Results  Component Value Date   WBC 4.2 11/24/2020   HGB 9.6 (L) 11/24/2020   HCT 30.7 (L) 11/24/2020   MCV 96.8 11/24/2020   PLT 143 (L) 11/24/2020      Chemistry      Component Value Date/Time   NA 141 10/27/2020 1100   NA 143 03/17/2020 0915   K 4.1 10/27/2020 1100   CL 111 10/27/2020 1100   CO2 25 10/27/2020 1100   BUN 15 10/27/2020 1100   BUN 6 (L) 03/17/2020 0915   CREATININE 0.72 10/27/2020 1100      Component Value Date/Time   CALCIUM 8.9 10/27/2020 1100   ALKPHOS 82 10/27/2020 1100   AST 19 10/27/2020 1100   ALT 17 10/27/2020 1100   BILITOT 0.6 10/27/2020 1100       RADIOGRAPHIC STUDIES: CUP PACEART REMOTE DEVICE CHECK  Result  Date: 11/11/2020 Scheduled remote reviewed. Normal device function.  Known persistent AF Next remote 91 days.   ASSESSMENT AND PLAN: This is a very pleasant 86 years old African-American female recently diagnosed with extensive stage small cell lung cancer presented with central right upper lobe lung mass in addition to left lower lobe lung nodule and mediastinal lymphadenopathy and metastatic bone lesion in the left iliac wing diagnosed in April 2021. The patient started her treatment today with carboplatin for AUC of 5 on day 1, etoposide 100 mg/M2 on days 1, 2 and 3 with Neulasta support in addition to Imfinzi 1500 mg every 3 weeks during the chemotherapy course followed by 1500 mg every 4 weeks as maintenance. Status post 11 cycles.  Starting from cycle #5 she is on maintenance treatment with Imfinzi every 4 weeks.  The patient continues to tolerate her treatment fairly well with no concerning adverse effects. I recommended for her to continue her current treatment with Imfinzi and she will proceed with cycle #12 today. I will see her back for follow-up visit in 4 weeks for evaluation with repeat CT scan of the chest, abdomen pelvis for restaging of her disease. She was advised to call immediately if she has any concerning symptoms in the interval. The patient voices understanding of current disease status and treatment options and is in agreement with the current care plan.  All questions were answered. The patient knows to call the clinic with any problems, questions or concerns. We can certainly see the patient much sooner if necessary.  Disclaimer: This note was dictated with voice recognition software. Similar sounding words can inadvertently be transcribed and may not be corrected upon review.

## 2020-11-25 ENCOUNTER — Inpatient Hospital Stay: Payer: Medicare Other | Admitting: General Practice

## 2020-11-25 ENCOUNTER — Telehealth: Payer: Self-pay | Admitting: Internal Medicine

## 2020-11-25 ENCOUNTER — Other Ambulatory Visit: Payer: Self-pay

## 2020-11-25 DIAGNOSIS — C3491 Malignant neoplasm of unspecified part of right bronchus or lung: Secondary | ICD-10-CM

## 2020-11-25 NOTE — Progress Notes (Signed)
[facility] Social Work  Clinical Social Work was referred by [facility]  to review and complete healthcare advance directives.  Clinical Social Worker met with patient and Nelson Chimes in [place] office.  The patient designated Nelson Chimes as their primary healthcare agent and no one as their secondary agent.  Patient also completed healthcare living will.    Clinical Social Worker notarized documents and made copies for patient/family. Clinical Social Worker will send documents to medical records to be scanned into patient's chart. Clinical Social Worker encouraged patient/family to contact with any additional questions or concerns.  Edwyna Shell, LCSW Clinical Social Worker Phone:  [phone]  \

## 2020-11-25 NOTE — Telephone Encounter (Signed)
Scheduled appts per 2/24 los. Pt to get updated appt calendar at next visit per appt notes.

## 2020-11-30 ENCOUNTER — Other Ambulatory Visit: Payer: Self-pay | Admitting: Family Medicine

## 2020-12-12 ENCOUNTER — Other Ambulatory Visit: Payer: Self-pay | Admitting: Family Medicine

## 2020-12-12 ENCOUNTER — Other Ambulatory Visit: Payer: Self-pay | Admitting: Cardiology

## 2020-12-19 NOTE — Progress Notes (Signed)
[name] OFFICE PROGRESS NOTE  [name], [name], MD [place] [address]  DIAGNOSIS:  Extensive stage (T2 a, N2, M1 C) small cell lung cancer presented with central right upper lobe lung mass in addition to left lower lobe pulmonary metastasis and bilateral hilar, subcarinal and bilateral paratracheal and left prevascular lymphadenopathy in addition to bone metastasis in the left iliac wing diagnosed in April 2021.  PRIOR THERAPY: None  CURRENT THERAPY: Systemic chemotherapy with carboplatin for AUC of 5 on day 1, etoposide 100 mg/M2 on days 1, 2 and 3 with Neulasta support in addition to Imfinzi 1500 mg IV every 3 weeks during chemotherapy followed by maintenance Imfinzi 1500 mg IV every 4 weeks after cycle #4.Status post12cycles.She started maintenance immunotherapy with single agent Imfinzi starting from cycle #5  INTERVAL HISTORY: [name] 86 y.o. female returns to the clinic today for a follow up visit accompanied by her daughter. The patient is feeling well today without any concerning complaints. She tolerated her last cycle of immunotherapy with Imfinzi well without any concerning adverse side effects.She denies any significant cough or hemoptysis but she reports her baseline dyspnea on exertion;however,this is greatly improved compared to when she first started treatment in which she was on supplemental oxygen. She denies any headache or visual changes. She denies any nausea or vomiting. She reports her baseline intermittent diarrhea which is unchanged. She uses imodium if needed. She lost a few pounds since her last appointment. She states she was going to try boost plus but could not find them. She reports dry skin on her back that itches as well as on her abdomen.The patient recently had a restaging CT scan performed. The patient is here today for evaluation and to review her scan before starting cycle #13.      MEDICAL HISTORY: Past  Medical History:  Diagnosis Date  . CAD (coronary artery disease), native coronary artery    PTCA of distal right coronary artery 1997 Cypher stents to circumflex 2005 Cardiac cath in 2011 with patent stent to circumflex and moderate disease elsewhere treated medically   . Cancer ([facility])   . Cardiac pacemaker in situ 12/24/2015   Original implant reportedly in 1991 for tachybradycardia syndrome, generator change in 1999, 2004 and evidently again in 2016 in [place]   . COPD (chronic obstructive pulmonary disease) ([facility])   . Diabetes mellitus without complication ([facility])   . Hypertension   . Hypothyroidism 03/23/2010  . Pacemaker   . Paroxysmal atrial fibrillation ([facility]) 03/23/2010   CHA2DS2VASC score 5     ALLERGIES:  is allergic to seasonal ic [cholestatin].  MEDICATIONS:  Current Outpatient Medications  Medication Sig Dispense Refill  . acetaminophen (TYLENOL) 500 MG tablet Take 500 mg by mouth every 6 (six) hours as needed for headache.    . albuterol (VENTOLIN HFA) 108 (90 Base) MCG/ACT inhaler TAKE 2 PUFFS BY MOUTH EVERY 6 HOURS AS NEEDED FOR WHEEZE OR SHORTNESS OF BREATH 1 each 1  . atorvastatin (LIPITOR) 10 MG tablet TAKE 1 TABLET BY MOUTH EVERY DAY 90 tablet 0  . carvedilol (COREG) 12.5 MG tablet TAKE 1 TABLET (12.5 MG TOTAL) BY MOUTH 2 (TWO) TIMES DAILY WITH A MEAL. 60 tablet 2  . diclofenac sodium (VOLTAREN) 1 % GEL Apply 4 g topically 4 (four) times daily. 4 Tube 3  . ELIQUIS 2.5 MG TABS tablet TAKE 1 TABLET BY MOUTH TWICE A DAY 60 tablet 5  . fenofibrate (TRICOR) 48 MG tablet  TAKE 1 TABLET BY MOUTH EVERY DAY 90 tablet 3  . glucose blood (ONETOUCH VERIO) test strip Use to test 3-4 times daily. 300 strip 4  . KLOR-CON M20 20 MEQ tablet TAKE 1 TABLET BY MOUTH TWICE A DAY FOR 2 DAYS THEN TAKE 1 TABLET BY MOUTH EVERY DAY 90 tablet 1  . levothyroxine (SYNTHROID) 112 MCG tablet Take 1 tablet (112 mcg total) by mouth daily before breakfast. 90 tablet 2  . lidocaine-prilocaine (EMLA) cream  Apply 1 application topically as needed. 30 g 2  . linagliptin (TRADJENTA) 5 MG TABS tablet TAKE 1 TABLET (5 MG TOTAL) DAILY BY MOUTH. 90 tablet 1  . Loperamide HCl (IMODIUM PO) Take by mouth as needed.    Marland Kitchen losartan (COZAAR) 50 MG tablet TAKE 2 TABLETS BY MOUTH EVERY DAY 180 tablet 1  . nitroGLYCERIN (NITROSTAT) 0.4 MG SL tablet Place 1 tablet (0.4 mg total) under the tongue every 5 (five) minutes x 3 doses as needed for chest pain (Max 3 doses within 15 min. Call 911). 10 tablet 0  . oxyCODONE-acetaminophen (PERCOCET/ROXICET) 5-325 MG tablet Take 1 tablet by mouth every 8 (eight) hours as needed for severe pain. 20 tablet 0  . pantoprazole (PROTONIX) 40 MG tablet TAKE 1 TABLET BY MOUTH EVERY DAY 90 tablet 1  . prochlorperazine (COMPAZINE) 10 MG tablet Take 1 tablet (10 mg total) by mouth every 6 (six) hours as needed for nausea or vomiting. 30 tablet 1  . doxepin (SINEQUAN) 10 MG/ML solution Take 0.3 mLs (3 mg total) by mouth at bedtime. 30 mL 1   No current facility-administered medications for this visit.    SURGICAL HISTORY:  Past Surgical History:  Procedure Laterality Date  . BIOPSY  01/08/2020   Procedure: BIOPSY;  Surgeon: Garner Nash, DO;  Location: [facility];  Service: Pulmonary;;  . BRONCHIAL BRUSHINGS  01/08/2020   Procedure: BRONCHIAL BRUSHINGS;  Surgeon: Garner Nash, DO;  Location: [facility];  Service: Pulmonary;;  . BRONCHIAL WASHINGS  01/08/2020   Procedure: BRONCHIAL WASHINGS;  Surgeon: Garner Nash, DO;  Location: [facility];  Service: Pulmonary;;  . CARDIAC CATHETERIZATION N/A 12/26/2015   Procedure: Left Heart Cath and Coronary Angiography;  Surgeon: Peter M [name], MD;  Location: [facility];  Service: Cardiovascular;  Laterality: N/A;  . CARDIOVERSION  05/2015  . CHOLECYSTECTOMY  2012  . CORONARY ANGIOPLASTY WITH STENT PLACEMENT  1990  . ENDOBRONCHIAL ULTRASOUND  01/08/2020   Procedure: ENDOBRONCHIAL ULTRASOUND;  Surgeon: Garner Nash, DO;   Location: [facility];  Service: Pulmonary;;  . FINE NEEDLE ASPIRATION BIOPSY  01/08/2020   Procedure: FINE NEEDLE ASPIRATION BIOPSY;  Surgeon: Garner Nash, DO;  Location: [facility];  Service: Pulmonary;;  . HERNIA REPAIR    . IR IMAGING GUIDED PORT INSERTION  02/18/2020  . PACEMAKER GENERATOR CHANGE  [id number], 2016  . PACEMAKER INSERTION  1991  . VIDEO BRONCHOSCOPY WITH ENDOBRONCHIAL ULTRASOUND N/A 01/08/2020   Procedure: VIDEO BRONCHOSCOPY;  Surgeon: Garner Nash, DO;  Location: [facility];  Service: Pulmonary;  Laterality: N/A;    REVIEW OF SYSTEMS:   Review of Systems  Constitutional: Negative for appetite change, chills, fatigue, fever and unexpected weight change.  HENT: Negative for mouth sores, nosebleeds, sore throat and trouble swallowing.   Eyes: Negative for eye problems and icterus.  Respiratory: Positive for baseline/stable mild dyspnea on exertion. Negative for cough, hemoptysis, and wheezing.   Cardiovascular: Negative for chest pain and leg swelling.  Gastrointestinal: Positive  for chronic diarrhea. Negative for abdominal pain, constipation, nausea and vomiting.  Genitourinary: Negative for bladder incontinence, difficulty urinating, dysuria, frequency and hematuria.   Musculoskeletal: Positive for intermittent right knee pain. Negative for back pain, gait problem, neck pain and neck stiffness.  Skin: Positive for itching and dry skin on her back.  Neurological: Negative for dizziness, extremity weakness, gait problem, headaches, light-headedness and seizures.  Hematological: Negative for adenopathy. Does not bruise/bleed easily.  Psychiatric/Behavioral: Negative for confusion, depression and sleep disturbance. The patient is not nervous/anxious.     PHYSICAL EXAMINATION:  Blood pressure (!) 145/53, pulse 61, temperature 97.6 F (36.4 C), temperature source Tympanic, resp. rate 13, height 5\' 2"  (1.575 m), weight 106 lb 8 oz (48.3 kg), SpO2 100 %.  ECOG  PERFORMANCE STATUS: 1 - Symptomatic but completely ambulatory  Physical Exam  Constitutional: Oriented to person, place, and time andthin appearing femaleand in no distress.  HENT:  Head: Normocephalic and atraumatic.  Mouth/Throat: Oropharynx is clear and moist. No oropharyngeal exudate.  Eyes: Conjunctivae are normal. Right eye exhibits no discharge. Left eye exhibits no discharge. No scleral icterus.  Neck: Normal range of motion. Neck supple.  Cardiovascular: Normal rate, regular rhythm, normal heart sounds and intact distal pulses.  Pulmonary/Chest: Effort normal. Quiet breath sounds in all lung fields. No respiratory distress. No wheezes. No rales.  Abdominal: Soft. Bowel sounds are normal. Exhibits no distension and no mass. Musculoskeletal: Normal range of motion. Exhibits no edema.  Lymphadenopathy:  No cervical adenopathy.  Neurological: Alert and oriented to person, place, and time. Exhibits normal muscle tone. Gait normal. Coordination normal.  Skin: A few scattered dry appearing skin lesions on right abdomen and left shoulder. Skin is warm and dry. Not diaphoretic. No erythema. No pallor.  Psychiatric: Mood, memory and judgment normal.  Vitals reviewed.  LABORATORY DATA: Lab Results  Component Value Date   WBC 4.5 12/22/2020   HGB 10.3 (L) 12/22/2020   HCT 32.0 (L) 12/22/2020   MCV 94.7 12/22/2020   PLT 132 (L) 12/22/2020      Chemistry      Component Value Date/Time   NA 144 12/22/2020 0846   NA 143 03/17/2020 0915   K 4.2 12/22/2020 0846   CL 110 12/22/2020 0846   CO2 26 12/22/2020 0846   BUN 19 12/22/2020 0846   BUN 6 (L) 03/17/2020 0915   CREATININE 0.77 12/22/2020 0846      Component Value Date/Time   CALCIUM 8.8 (L) 12/22/2020 0846   ALKPHOS 80 12/22/2020 0846   AST 17 12/22/2020 0846   ALT 18 12/22/2020 0846   BILITOT 0.3 12/22/2020 0846       RADIOGRAPHIC STUDIES:  CT Chest W Contrast  Result Date: 12/21/2020 CLINICAL DATA:   Small-cell lung cancer, staging. Right upper lobe lung mass and left lower lobe pulmonary metastasis. EXAM: CT CHEST, ABDOMEN, AND PELVIS WITH CONTRAST TECHNIQUE: Multidetector CT imaging of the chest, abdomen and pelvis was performed following the standard protocol during bolus administration of intravenous contrast. CONTRAST:  161mL OMNIPAQUE IOHEXOL 300 MG/ML  SOLN COMPARISON:  09/16/2020 FINDINGS: CT CHEST FINDINGS Cardiovascular: Left Port-A-Cath tip at high right atrium. Dual lead pacer. Aortic atherosclerosis. Multivessel coronary artery atherosclerosis. Mediastinum/Nodes: No supraclavicular adenopathy. No mediastinal or hilar adenopathy. Esophageal fluid level on 28/2. Lungs/Pleura: No pleural fluid.  Advanced bullous emphysema. Right medial upper lobe 1.9 x 1.9 cm irregular nodule on 40/6 is decreased from 2.1 x 1.9 cm on the prior exam (when remeasured). Tiny calcified granuloma  in the left upper lobe including on 30/6. Triangular left lower lobe pulmonary nodule of 8 mm on 63/6 is unchanged. Posterior left upper lobe 2 mm nodule is also likely a calcified granuloma on 33/6. Musculoskeletal: No acute osseous abnormality. CT ABDOMEN PELVIS FINDINGS Hepatobiliary: Normal liver. Cholecystectomy, without biliary ductal dilatation. Pancreas: Mild pancreatic atrophy with borderline duct dilatation, similar. Spleen: Normal in size, without focal abnormality. Adrenals/Urinary Tract: Normal adrenal glands. Interpolar left renal 1.2 cm low-density lesion is likely a cyst or minimally complex cyst. Normal right kidney, without hydronephrosis. Degraded evaluation of the pelvis, secondary to beam hardening artifact from right femur fixation. Grossly normal urinary bladder. Stomach/Bowel: Normal stomach, without wall thickening. Scattered colonic diverticula. Normal terminal ileum and appendix. Normal small bowel. Vascular/Lymphatic: Advanced aortic and branch vessel atherosclerosis. No abdominopelvic adenopathy.  Reproductive: Normal uterus and adnexa. Prominent gonadal veins, especially on the left. Other: No significant free fluid. Mild pelvic floor laxity. No evidence of omental or peritoneal disease. Musculoskeletal: Osteopenia.  Right proximal femur fixation. IMPRESSION: 1. Decreased size of a right upper lobe lung nodule. Similar size of a left lower lobe pulmonary nodule. 2. No thoracic adenopathy. 3. No acute process or evidence of metastatic disease in the abdomen or pelvis. 4. Aortic atherosclerosis (ICD10-I70.0), coronary artery atherosclerosis and emphysema (ICD10-J43.9). 5. Esophageal air fluid level suggests dysmotility or gastroesophageal reflux. 6. Prominent gonadal veins, as can be seen with pelvic congestion syndrome. Electronically Signed   By: Abigail Miyamoto M.D.   On: 12/21/2020 13:32   CT Abdomen Pelvis W Contrast  Result Date: 12/21/2020 CLINICAL DATA:  Small-cell lung cancer, staging. Right upper lobe lung mass and left lower lobe pulmonary metastasis. EXAM: CT CHEST, ABDOMEN, AND PELVIS WITH CONTRAST TECHNIQUE: Multidetector CT imaging of the chest, abdomen and pelvis was performed following the standard protocol during bolus administration of intravenous contrast. CONTRAST:  1109mL OMNIPAQUE IOHEXOL 300 MG/ML  SOLN COMPARISON:  09/16/2020 FINDINGS: CT CHEST FINDINGS Cardiovascular: Left Port-A-Cath tip at high right atrium. Dual lead pacer. Aortic atherosclerosis. Multivessel coronary artery atherosclerosis. Mediastinum/Nodes: No supraclavicular adenopathy. No mediastinal or hilar adenopathy. Esophageal fluid level on 28/2. Lungs/Pleura: No pleural fluid.  Advanced bullous emphysema. Right medial upper lobe 1.9 x 1.9 cm irregular nodule on 40/6 is decreased from 2.1 x 1.9 cm on the prior exam (when remeasured). Tiny calcified granuloma in the left upper lobe including on 30/6. Triangular left lower lobe pulmonary nodule of 8 mm on 63/6 is unchanged. Posterior left upper lobe 2 mm nodule is also  likely a calcified granuloma on 33/6. Musculoskeletal: No acute osseous abnormality. CT ABDOMEN PELVIS FINDINGS Hepatobiliary: Normal liver. Cholecystectomy, without biliary ductal dilatation. Pancreas: Mild pancreatic atrophy with borderline duct dilatation, similar. Spleen: Normal in size, without focal abnormality. Adrenals/Urinary Tract: Normal adrenal glands. Interpolar left renal 1.2 cm low-density lesion is likely a cyst or minimally complex cyst. Normal right kidney, without hydronephrosis. Degraded evaluation of the pelvis, secondary to beam hardening artifact from right femur fixation. Grossly normal urinary bladder. Stomach/Bowel: Normal stomach, without wall thickening. Scattered colonic diverticula. Normal terminal ileum and appendix. Normal small bowel. Vascular/Lymphatic: Advanced aortic and branch vessel atherosclerosis. No abdominopelvic adenopathy. Reproductive: Normal uterus and adnexa. Prominent gonadal veins, especially on the left. Other: No significant free fluid. Mild pelvic floor laxity. No evidence of omental or peritoneal disease. Musculoskeletal: Osteopenia.  Right proximal femur fixation. IMPRESSION: 1. Decreased size of a right upper lobe lung nodule. Similar size of a left lower lobe pulmonary nodule. 2. No thoracic adenopathy.  3. No acute process or evidence of metastatic disease in the abdomen or pelvis. 4. Aortic atherosclerosis (ICD10-I70.0), coronary artery atherosclerosis and emphysema (ICD10-J43.9). 5. Esophageal air fluid level suggests dysmotility or gastroesophageal reflux. 6. Prominent gonadal veins, as can be seen with pelvic congestion syndrome. Electronically Signed   By: Abigail Miyamoto M.D.   On: 12/21/2020 13:32     ASSESSMENT/PLAN:  This is a very pleasant 86year old [place] American female with extensive stage small cell lung cancer. She presented with a central right upper lobe lung mass in addition to left lower lobe nodule and mediastinal lymphadenopathy and a  metastatic bone lesion in the left iliac wing. She was diagnosed in April 2021.   The patient is currently undergoing treatment with palliative systemic chemotherapy with carboplatin for an AUC of 5 on day 1, etoposide 100 mg/m2 on days 1, 2, and 3 and Imfinzi 1500 mg every 3 weeks.Starting from cycle #5, the patient has been on maintenance immunotherapy with Imfinzi.She is status post12cycles.   The patient recently had a restaging CT scan performed. Dr. Julien Nordmann personally and independently reviewed the scan and discussed the results with the patient. The scan showed no evidence for disease progression. Dr. Julien Nordmann recommends that she proceed with cycle #13 today as scheduled.   We will see her back for a follow up visit in 4 weeks for evaluation before starting cycle #14.   Discussed she may apply hydrocortisone cream on the few scattered itchy skin lesions.   She has chronic diarrhea that predates her chemotherapy/immunotherapy treatment. No evidence of colitis on imaging studies. She will continue to take imodium.   If the patient is unable to find boost at the store, dicussed she may order it. Otherwise, she may always make herself protein shakes herself.   The patient was advised to call immediately if she has any concerning symptoms in the interval. The patient voices understanding of current disease status and treatment options and is in agreement with the current care plan. All questions were answered. The patient knows to call the clinic with any problems, questions or concerns. We can certainly see the patient much sooner if necessary        Orders Placed This Encounter  Procedures  . TSH    Standing Status:   Standing    Number of Occurrences:   10    Standing Expiration Date:   12/22/2021       Tobe Sos Pablo Mathurin, PA-C 12/22/20  ADDENDUM: Hematology/Oncology Attending: I had a face-to-face encounter with the patient today.  I reviewed her records, scan  and recommended her care plan.  The patient is a very pleasant 86 years old African-American female diagnosed with extensive stage small cell lung cancer in August 2021 status post 4 cycles of induction systemic chemotherapy with carboplatin, etoposide and Imfinzi.  She has partial response to this treatment and she is currently undergoing maintenance treatment with Imfinzi every 4 weeks status post total treatment of 12 cycles.  The patient has been tolerating this treatment well with no concerning adverse effects. She had repeat CT scan of the chest, abdomen pelvis performed recently.  I personally and independently reviewed the scan and discussed the results with the patient and her daughter. Her scan showed no concerning findings for disease progression and the patient continues to have benefit from the treatment with immunotherapy. I recommended for her to proceed with cycle #13 today as planned. I will see the patient back for follow-up visit in 4  weeks for evaluation before starting cycle #14. She was advised to call immediately if she has any other concerning symptoms in the interval. The total time spent in the appointment was 35 minutes.  Disclaimer: This note was dictated with voice recognition software. Similar sounding words can inadvertently be transcribed and may be missed upon review. Eilleen Kempf, MD 12/23/20

## 2020-12-20 ENCOUNTER — Other Ambulatory Visit: Payer: Self-pay

## 2020-12-20 ENCOUNTER — Encounter (HOSPITAL_COMMUNITY): Payer: Self-pay

## 2020-12-20 ENCOUNTER — Ambulatory Visit (HOSPITAL_COMMUNITY)
Admission: RE | Admit: 2020-12-20 | Discharge: 2020-12-20 | Disposition: A | Discharge status: Home / Self Care | Payer: Medicare Other | Source: Ambulatory Visit | Attending: Internal Medicine | Admitting: Internal Medicine

## 2020-12-20 DIAGNOSIS — C3490 Malignant neoplasm of unspecified part of unspecified bronchus or lung: Secondary | ICD-10-CM | POA: Insufficient documentation

## 2020-12-20 DIAGNOSIS — I700 Atherosclerosis of aorta: Secondary | ICD-10-CM | POA: Diagnosis not present

## 2020-12-20 DIAGNOSIS — C3412 Malignant neoplasm of upper lobe, left bronchus or lung: Secondary | ICD-10-CM | POA: Diagnosis not present

## 2020-12-20 DIAGNOSIS — I2510 Atherosclerotic heart disease of native coronary artery without angina pectoris: Secondary | ICD-10-CM | POA: Diagnosis not present

## 2020-12-20 DIAGNOSIS — K8689 Other specified diseases of pancreas: Secondary | ICD-10-CM | POA: Diagnosis not present

## 2020-12-20 DIAGNOSIS — J439 Emphysema, unspecified: Secondary | ICD-10-CM | POA: Diagnosis not present

## 2020-12-20 MED ORDER — HEPARIN SOD (PORK) LOCK FLUSH 100 UNIT/ML IV SOLN
500.0000 [IU] | Freq: Once | INTRAVENOUS | Status: AC
  Administered 2020-12-20: 500 [IU] via INTRAVENOUS

## 2020-12-20 MED ORDER — HEPARIN SOD (PORK) LOCK FLUSH 100 UNIT/ML IV SOLN
INTRAVENOUS | Status: AC
  Filled 2020-12-20: qty 5

## 2020-12-20 MED ORDER — IOHEXOL 300 MG/ML  SOLN
100.0000 mL | Freq: Once | INTRAMUSCULAR | Status: AC | PRN
  Administered 2020-12-20: 100 mL via INTRAVENOUS

## 2020-12-22 ENCOUNTER — Inpatient Hospital Stay: Payer: Medicare Other | Attending: Internal Medicine

## 2020-12-22 ENCOUNTER — Other Ambulatory Visit: Payer: Self-pay

## 2020-12-22 ENCOUNTER — Inpatient Hospital Stay: Payer: Medicare Other

## 2020-12-22 ENCOUNTER — Encounter: Payer: Self-pay | Admitting: Physician Assistant

## 2020-12-22 ENCOUNTER — Inpatient Hospital Stay (HOSPITAL_BASED_OUTPATIENT_CLINIC_OR_DEPARTMENT_OTHER): Payer: Medicare Other | Admitting: Physician Assistant

## 2020-12-22 VITALS — BP 145/53 | HR 61 | Temp 97.6°F | Resp 13 | Ht 62.0 in | Wt 106.5 lb

## 2020-12-22 DIAGNOSIS — I10 Essential (primary) hypertension: Secondary | ICD-10-CM | POA: Diagnosis not present

## 2020-12-22 DIAGNOSIS — E039 Hypothyroidism, unspecified: Secondary | ICD-10-CM | POA: Diagnosis not present

## 2020-12-22 DIAGNOSIS — C3491 Malignant neoplasm of unspecified part of right bronchus or lung: Secondary | ICD-10-CM | POA: Diagnosis not present

## 2020-12-22 DIAGNOSIS — E119 Type 2 diabetes mellitus without complications: Secondary | ICD-10-CM | POA: Diagnosis not present

## 2020-12-22 DIAGNOSIS — C7800 Secondary malignant neoplasm of unspecified lung: Secondary | ICD-10-CM | POA: Insufficient documentation

## 2020-12-22 DIAGNOSIS — Z5112 Encounter for antineoplastic immunotherapy: Secondary | ICD-10-CM

## 2020-12-22 DIAGNOSIS — K529 Noninfective gastroenteritis and colitis, unspecified: Secondary | ICD-10-CM | POA: Diagnosis not present

## 2020-12-22 DIAGNOSIS — Z95828 Presence of other vascular implants and grafts: Secondary | ICD-10-CM

## 2020-12-22 DIAGNOSIS — I480 Paroxysmal atrial fibrillation: Secondary | ICD-10-CM | POA: Diagnosis not present

## 2020-12-22 DIAGNOSIS — C7951 Secondary malignant neoplasm of bone: Secondary | ICD-10-CM | POA: Insufficient documentation

## 2020-12-22 DIAGNOSIS — C3411 Malignant neoplasm of upper lobe, right bronchus or lung: Secondary | ICD-10-CM | POA: Diagnosis not present

## 2020-12-22 LAB — CBC WITH DIFFERENTIAL (CANCER CENTER ONLY)
Abs Immature Granulocytes: 0.01 10*3/uL (ref 0.00–0.07)
Basophils Absolute: 0 10*3/uL (ref 0.0–0.1)
Basophils Relative: 1 %
Eosinophils Absolute: 0.1 10*3/uL (ref 0.0–0.5)
Eosinophils Relative: 2 %
HCT: 32 % — ABNORMAL LOW (ref 36.0–46.0)
Hemoglobin: 10.3 g/dL — ABNORMAL LOW (ref 12.0–15.0)
Immature Granulocytes: 0 %
Lymphocytes Relative: 41 %
Lymphs Abs: 1.8 10*3/uL (ref 0.7–4.0)
MCH: 30.5 pg (ref 26.0–34.0)
MCHC: 32.2 g/dL (ref 30.0–36.0)
MCV: 94.7 fL (ref 80.0–100.0)
Monocytes Absolute: 0.7 10*3/uL (ref 0.1–1.0)
Monocytes Relative: 15 %
Neutro Abs: 1.8 10*3/uL (ref 1.7–7.7)
Neutrophils Relative %: 41 %
Platelet Count: 132 10*3/uL — ABNORMAL LOW (ref 150–400)
RBC: 3.38 MIL/uL — ABNORMAL LOW (ref 3.87–5.11)
RDW: 14.3 % (ref 11.5–15.5)
WBC Count: 4.5 10*3/uL (ref 4.0–10.5)
nRBC: 0 % (ref 0.0–0.2)

## 2020-12-22 LAB — CMP (CANCER CENTER ONLY)
ALT: 18 U/L (ref 0–44)
AST: 17 U/L (ref 15–41)
Albumin: 3.5 g/dL (ref 3.5–5.0)
Alkaline Phosphatase: 80 U/L (ref 38–126)
Anion gap: 8 (ref 5–15)
BUN: 19 mg/dL (ref 8–23)
CO2: 26 mmol/L (ref 22–32)
Calcium: 8.8 mg/dL — ABNORMAL LOW (ref 8.9–10.3)
Chloride: 110 mmol/L (ref 98–111)
Creatinine: 0.77 mg/dL (ref 0.44–1.00)
GFR, Estimated: >60 mL/min (ref >60)
Glucose, Bld: 90 mg/dL (ref 70–99)
Potassium: 4.2 mmol/L (ref 3.5–5.1)
Sodium: 144 mmol/L (ref 135–145)
Total Bilirubin: 0.3 mg/dL (ref 0.3–1.2)
Total Protein: 6.4 g/dL — ABNORMAL LOW (ref 6.5–8.1)

## 2020-12-22 MED ORDER — SODIUM CHLORIDE 0.9% FLUSH
10.0000 mL | Freq: Once | INTRAVENOUS | Status: AC
  Administered 2020-12-22: 10 mL
  Filled 2020-12-22: qty 10

## 2020-12-22 MED ORDER — SODIUM CHLORIDE 0.9% FLUSH
10.0000 mL | INTRAVENOUS | Status: DC | PRN
  Administered 2020-12-22: 10 mL
  Filled 2020-12-22: qty 10

## 2020-12-22 MED ORDER — HEPARIN SOD (PORK) LOCK FLUSH 100 UNIT/ML IV SOLN
500.0000 [IU] | Freq: Once | INTRAVENOUS | Status: AC | PRN
  Administered 2020-12-22: 500 [IU]
  Filled 2020-12-22: qty 5

## 2020-12-22 MED ORDER — SODIUM CHLORIDE 0.9 % IV SOLN
1500.0000 mg | Freq: Once | INTRAVENOUS | Status: AC
  Administered 2020-12-22: 1500 mg via INTRAVENOUS
  Filled 2020-12-22: qty 30

## 2020-12-22 MED ORDER — SODIUM CHLORIDE 0.9 % IV SOLN
Freq: Once | INTRAVENOUS | Status: AC
  Filled 2020-12-22: qty 250

## 2020-12-22 NOTE — Patient Instructions (Signed)
[facility] Discharge Instructions for Patients Receiving Chemotherapy  Today you received the following chemotherapy agents: Durvalumab (Imfinzi)   To help prevent nausea and vomiting after your treatment, we encourage you to take your nausea medication as directed.   If you develop nausea and vomiting that is not controlled by your nausea medication, call the clinic.   BELOW ARE SYMPTOMS THAT SHOULD BE REPORTED IMMEDIATELY:  *FEVER GREATER THAN 100.5 F  *CHILLS WITH OR WITHOUT FEVER  NAUSEA AND VOMITING THAT IS NOT CONTROLLED WITH YOUR NAUSEA MEDICATION  *UNUSUAL SHORTNESS OF BREATH  *UNUSUAL BRUISING OR BLEEDING  TENDERNESS IN MOUTH AND THROAT WITH OR WITHOUT PRESENCE OF ULCERS  *URINARY PROBLEMS  *BOWEL PROBLEMS  UNUSUAL RASH Items with * indicate a potential emergency and should be followed up as soon as possible.  Feel free to call the clinic should you have any questions or concerns. The clinic phone number is [phone].  Please show the CHEMO ALERT CARD at check-in to the Emergency Department and triage nurse.   

## 2020-12-22 NOTE — Patient Instructions (Signed)

## 2021-01-04 ENCOUNTER — Telehealth: Payer: Self-pay | Admitting: Pharmacist

## 2021-01-04 NOTE — Chronic Care Management (AMB) (Signed)
I spoke with the patient about her upcoming appointment on 01/05/2021 @ 12:00 pm with the clinical pharmacist. She was asked to please have all medication on hand to review with the pharmacist. She confirmed the appointment.   [name], [place] [phone]

## 2021-01-05 ENCOUNTER — Ambulatory Visit (INDEPENDENT_AMBULATORY_CARE_PROVIDER_SITE_OTHER): Payer: Medicare Other | Admitting: Pharmacist

## 2021-01-05 DIAGNOSIS — I119 Hypertensive heart disease without heart failure: Secondary | ICD-10-CM

## 2021-01-05 DIAGNOSIS — E785 Hyperlipidemia, unspecified: Secondary | ICD-10-CM | POA: Diagnosis not present

## 2021-01-05 DIAGNOSIS — E1169 Type 2 diabetes mellitus with other specified complication: Secondary | ICD-10-CM

## 2021-01-05 NOTE — Progress Notes (Signed)
Chronic Care Management Pharmacy Note  01/19/2021 Name:  Nathali Vent MRN:  [id number] DOB:  [birth year]  Subjective: Jezabelle Chisolm is an 86 y.o. year old female who is a primary patient of [name], Malka So, MD.  The CCM team was consulted for assistance with disease management and care coordination needs.    Engaged with patient by telephone for follow up visit in response to provider referral for pharmacy case management and/or care coordination services.   Consent to Services:  The patient was given information about Chronic Care Management services, agreed to services, and gave verbal consent prior to initiation of services.  Please see initial visit note for detailed documentation.   Patient Care Team: [name], [name], MD as PCP - General (Family Medicine) Constance Haw, MD as PCP - Electrophysiology (Cardiology) [name], MD as PCP - Cardiology (Cardiology) Viona Gilmore, [facility] as Pharmacist (Pharmacist)  Recent office visits: 10/05/20 Alysia Penna, MD: Patient presented for abdominal pain.  08/16/20 Charlott Rakes, LPN: Patient presented for AWV.  04/27/20: Patient presented to Dr. [name] for follow-up. A1c 5.4%. Patient started on Doxepin 3 mg solution at bedtime for constipation.   Recent consult visits: 12/22/20 Cassandra Heilingoetter, PA-C (Oncology): Patient presented for Imfinzi infusion and lung cancer follow up.   11/25/20 Edwyna Shell, LCSW (oncology): Patient presented to fill out advanced directives documents.  11/24/20 Curt Bears, MD (Oncology): Patient presented for Imfinzi infusion and lung cancer follow up.   10/27/20 12/22/20 Cassandra Heilingoetter, PA-C (Oncology): Patient presented for Imfinzi infusion and lung cancer follow up.   09/19/20 Curt Bears, MD (Oncology): Patient presented for Imfinzi infusion and lung cancer follow up.   08/19/20 Adelina Mings Margret Chance, MD (optometry): Unable to access notes.   Hospital visits: 10/04/20 Patient  presented to the ED for abdominal pain.  Objective:  Lab Results  Component Value Date   CREATININE 0.77 01/19/2021   BUN 19 01/19/2021   GFR 117.94 12/15/2019   GFRNONAA >60 01/19/2021   GFRAA >60 06/27/2020   NA 145 01/19/2021   K 4.5 01/19/2021   CALCIUM 9.0 01/19/2021   CO2 25 01/19/2021   GLUCOSE 100 (H) 01/19/2021    Lab Results  Component Value Date/Time   HGBA1C 6.2 01/17/2021 10:18 AM   HGBA1C 5.4 04/27/2020 02:35 PM   HGBA1C 6.5 09/30/2019 08:26 AM   FRUCTOSAMINE 254 01/17/2021 10:18 AM   FRUCTOSAMINE 229 06/10/2019 09:29 AM   GFR 117.94 12/15/2019 11:23 AM   GFR 85.50 09/30/2019 08:26 AM   MICROALBUR 6.0 (H) 06/10/2019 09:29 AM   MICROALBUR 4.2 (H) 11/12/2018 09:56 AM    Last diabetic Eye exam: No results found for: HMDIABEYEEXA  Last diabetic Foot exam: No results found for: HMDIABFOOTEX   Lab Results  Component Value Date   CHOL 116 01/17/2021   HDL 63.70 01/17/2021   LDLCALC 43 01/17/2021   TRIG 49.0 01/17/2021   CHOLHDL 2 01/17/2021    Hepatic Function Latest Ref Rng & Units 01/19/2021 12/22/2020 11/24/2020  Total Protein 6.5 - 8.1 g/dL 6.6 6.4(L) 6.3(L)  Albumin 3.5 - 5.0 g/dL 3.5 3.5 3.3(L)  AST 15 - 41 U/L _0 ALT 0 - 44 U/L _1 Alk Phosphatase 38 - 126 U/L 87 80 78  Total Bilirubin 0.3 - 1.2 mg/dL 0.3 0.3 0.4  Bilirubin, Direct 0.1 - 0.5 mg/dL - - -    Lab Results  Component Value Date/Time   TSH 1.014 01/19/2021 10:00 AM  TSH 0.474 11/24/2020 08:16 AM   TSH 0.64 06/10/2019 09:29 AM   TSH 0.42 10/09/2017 09:47 AM   FREET4 1.19 05/10/2017 10:59 AM    CBC Latest Ref Rng & Units 01/19/2021 12/22/2020 11/24/2020  WBC 4.0 - 10.5 K/uL 4.1 4.5 4.2  Hemoglobin 12.0 - 15.0 g/dL 10.8(L) 10.3(L) 9.6(L)  Hematocrit 36.0 - 46.0 % 33.8(L) 32.0(L) 30.7(L)  Platelets 150 - 400 K/uL 134(L) 132(L) 143(L)    No results found for: VD25OH  Clinical ASCVD: Yes  The ASCVD Risk score Mikey Bussing DC Jr., et al., 2013) failed to calculate for the  following reasons:   The 2013 ASCVD risk score is only valid for ages 83 to 59    Depression screen PHQ 2/9 08/16/2020 10/02/2019  Decreased Interest 0 0  Down, Depressed, Hopeless 0 0  PHQ - 2 Score 0 0  Some recent data might be hidden     Social History   Tobacco Use  Smoking Status Former Smoker  . Packs/day: 0.75  . Years: 66.00  . Pack years: 49.50  . Quit date: 12/27/2019  . Years since quitting: 1.0  Smokeless Tobacco Never Used   BP Readings from Last 3 Encounters:  01/19/21 (!) 128/50  01/17/21 116/70  12/22/20 (!) 145/53   Pulse Readings from Last 3 Encounters:  01/19/21 61  01/17/21 67  12/22/20 61   Wt Readings from Last 3 Encounters:  01/19/21 106 lb 4.8 oz (48.2 kg)  01/17/21 105 lb 3.2 oz (47.7 kg)  12/22/20 106 lb 8 oz (48.3 kg)   BMI Readings from Last 3 Encounters:  01/19/21 19.44 kg/m  01/17/21 19.24 kg/m  12/22/20 19.48 kg/m    Assessment/Interventions: Review of patient past medical history, allergies, medications, health status, including review of consultants reports, laboratory and other test data, was performed as part of comprehensive evaluation and provision of chronic care management services.   SDOH:  (Social Determinants of Health) assessments and interventions performed: No  SDOH Screenings   Alcohol Screen: Not on file  Depression (PHQ2-9): Low Risk   . PHQ-2 Score: 0  Financial Resource Strain: Low Risk   . Difficulty of Paying Living Expenses: Not hard at all  Food Insecurity: No Food Insecurity  . Worried About Charity fundraiser in the Last Year: Never true  . Ran Out of Food in the Last Year: Never true  Housing: Low Risk   . Last Housing Risk Score: 0  Physical Activity: Inactive  . Days of Exercise per Week: 0 days  . Minutes of Exercise per Session: 0 min  Social Connections: Socially Isolated  . Frequency of Communication with Friends and Family: More than three times a week  . Frequency of Social Gatherings  with Friends and Family: Never  . Attends Religious Services: Never  . Active Member of Clubs or Organizations: No  . Attends Archivist Meetings: Never  . Marital Status: Widowed  Stress: No Stress Concern Present  . Feeling of Stress : Not at all  Tobacco Use: Medium Risk  . Smoking Tobacco Use: Former Smoker  . Smokeless Tobacco Use: Never Used  Transportation Needs: No Transportation Needs  . Lack of Transportation (Medical): No  . Lack of Transportation (Non-Medical): No    CCM Care Plan  Allergies  Allergen Reactions  . Seasonal Ic [Cholestatin]     Itchy eye and runny nose    Medications Reviewed Today    Reviewed by Ardeen Garland, RN (Registered Nurse) on 01/19/21  at [place] List Status: <None>  Medication Order Taking? Sig Documenting Provider Last Dose Status Informant  acetaminophen (TYLENOL) 500 MG tablet [id number] No Take 500 mg by mouth every 6 (six) hours as needed for headache. [provider] Taking Active Child  albuterol (VENTOLIN HFA) 108 (90 Base) MCG/ACT inhaler [id number] No TAKE 2 PUFFS BY MOUTH EVERY 6 HOURS AS NEEDED FOR WHEEZE OR SHORTNESS OF BREATH [name], [name], MD Taking Active   atorvastatin (LIPITOR) 10 MG tablet [id number] No TAKE 1 TABLET BY MOUTH EVERY DAY [name], MD Taking Active   carvedilol (COREG) 12.5 MG tablet [id number] No TAKE 1 TABLET (12.5 MG TOTAL) BY MOUTH 2 (TWO) TIMES DAILY WITH A MEAL. Laurey Morale, MD Taking Active   diclofenac sodium (VOLTAREN) 1 % GEL [id number] No Apply 4 g topically 4 (four) times daily. [name], [name], MD Taking Active   doxepin [facility]) 10 MG/ML solution [id number] No Take 0.3 mLs (3 mg total) by mouth at bedtime. [name], [name], MD Taking Active   ELIQUIS 2.5 MG TABS tablet [id number] No TAKE 1 TABLET BY MOUTH TWICE A DAY [name], MD Taking Active   fenofibrate (TRICOR) 48 MG tablet [id number] No TAKE 1 TABLET BY MOUTH EVERY DAY [name], [name], MD Taking Active   glucose  blood [facility] VERIO) test strip [id number] No Use to test 3-4 times daily. [name], [name], MD Taking Active Child  KLOR-CON M20 20 MEQ tablet [id number] No TAKE 1 TABLET BY MOUTH TWICE A DAY FOR 2 DAYS THEN TAKE 1 TABLET BY MOUTH EVERY DAY [name], [name], MD Taking Active   levothyroxine (SYNTHROID) 112 MCG tablet [id number] No Take 1 tablet (112 mcg total) by mouth daily before breakfast. [name], [name], MD Taking Active   lidocaine-prilocaine (EMLA) cream [id number] No Apply 1 application topically as needed. Heilingoetter, Cassandra L, PA-C Taking Active   linagliptin (TRADJENTA) 5 MG TABS tablet [id number] No TAKE 1 TABLET (5 MG TOTAL) DAILY BY MOUTH. [name], [name], MD Taking Active   Loperamide HCl (IMODIUM PO) [id number] No Take by mouth as needed. [provider] Taking Active   losartan (COZAAR) 100 MG tablet [id number]  Take 1 tablet (100 mg total) by mouth daily. [name], [name], MD  Active   nitroGLYCERIN (NITROSTAT) 0.4 MG SL tablet [id number] No Place 1 tablet (0.4 mg total) under the tongue every 5 (five) minutes x 3 doses as needed for chest pain (Max 3 doses within 15 min. Call 911). Sherran Needs, NP Taking Active   oxyCODONE-acetaminophen (PERCOCET/ROXICET) 5-325 MG tablet [id number] No Take 1 tablet by mouth every 8 (eight) hours as needed for severe pain. Curt Bears, MD Taking Active            Med Note Demetria Pore Feb 18, 2020 12:59 PM) Not started  pantoprazole (PROTONIX) 40 MG tablet [id number] No TAKE 1 TABLET BY MOUTH EVERY DAY [name], [name], MD Taking Active   prochlorperazine (COMPAZINE) 10 MG tablet [id number] No Take 1 tablet (10 mg total) by mouth every 6 (six) hours as needed for nausea or vomiting. Curt Bears, MD Taking Active           Patient Active Problem List   Diagnosis Date Noted  . Epigastric abdominal pain 04/27/2020  . Port-A-Cath in place 04/11/2020  . Small cell lung cancer, right ([place]) 01/28/2020  . Encounter for  antineoplastic chemotherapy 01/28/2020  . Encounter for antineoplastic immunotherapy 01/28/2020  .  Goals of care, counseling/discussion 01/28/2020  . Near syncope 01/05/2020  . Mediastinal lymphadenopathy 01/01/2020  . Multiple lung nodules on CT 01/01/2020  . Atherosclerosis of aorta ([place]) 12/28/2019  . Chronic diarrhea 12/15/2019  . Hyperlipidemia associated with type 2 diabetes mellitus ([place]) 06/10/2019  . Chronic rhinitis 02/28/2018  . Tobacco use disorder 05/10/2017  . Hypertension with heart disease 05/10/2017  . Chest pain 03/10/2017  . Acute thoracic back pain   . Cardiac pacemaker in situ 12/24/2015  . History of Graves' disease 12/24/2015  . Current use of long term anticoagulation 12/24/2015  . Type 2 diabetes mellitus with other specified complication ([place])   . Hypothyroidism 03/23/2010  . Paroxysmal atrial fibrillation ([place]) 03/23/2010  . COPD (chronic obstructive pulmonary disease) ([place]) 03/23/2010  . CAD (coronary artery disease), native coronary artery   . GERD     Immunization History  Administered Date(s) Administered  . Fluad Quad(high Dose 65+) 10/05/2020  . Influenza, High Dose Seasonal PF 07/31/2017, 07/24/2018  . Influenza-Unspecified 07/31/2017  . Pneumococcal Polysaccharide-23 04/27/2020   [facility].   Daughter manages her pill box and straighted it out for the whole week. No problems with cost of medications.    Conditions to be addressed/monitored:  Hypertension, Hyperlipidemia, Diabetes, Atrial Fibrillation, Coronary Artery Disease, GERD, COPD, Hypothyroidism and Chronic Diarrhea and Lung Cancer  Care Plan : CCM Pharmacy Care Plan  Updates made by Viona Gilmore, [place] since 01/19/2021 12:00 AM    Problem: Problem: Hypertension, Hyperlipidemia, Diabetes, Atrial Fibrillation, Coronary Artery Disease, GERD, COPD, Hypothyroidism and Chronic Diarrhea and Lung Cancer     Long-Range Goal: Patient-Specific Goal   Start Date: 01/05/2021   Expected End Date: 01/05/2022  This Visit's Progress: On track  Priority: High  Note:   Current Barriers:  . Unable to independently monitor therapeutic efficacy . Suboptimal therapeutic regimen for cholesterol  Pharmacist Clinical Goal(s):  Marland Kitchen Patient will achieve adherence to monitoring guidelines and medication adherence to achieve therapeutic efficacy . maintain control of blood pressure as evidenced by home blood pressure readings  through collaboration with PharmD and provider.   Interventions: . 1:1 collaboration with [name], [name], MD regarding development and update of comprehensive plan of care as evidenced by provider attestation and co-signature . Inter-disciplinary care team collaboration (see longitudinal plan of care) . Comprehensive medication review performed; medication list updated in electronic medical record  Hypertension (BP goal <140/90) -Uncontrolled -Current treatment:  Carvedilol 12.5 mg twice daily   Losartan 50 mg 2 tablets daily -Medications previously tried: none  -Current home readings: not checking consistently but her granddaughter can -Current dietary habits: limits salt intake; cut down on meat; loves broccoli and loves cabbage  -Current exercise habits: no structured exercise -Denies hypotensive/hypertensive symptoms -Educated on Importance of home blood pressure monitoring; Proper BP monitoring technique; -Counseled to monitor BP at home weekly, document, and provide log at future appointments -Counseled on diet and exercise extensively Recommended to continue current medication Recommended switching to losartan 100 mg tablets. Request sent to [place].  Hyperlipidemia: (LDL goal < 70) -Controlled -Current treatment:  Atorvastatin 10 mg daily   Fenofibrate 48 mg daily -Medications previously tried: none  -Current dietary patterns: has cut down on red meat -Current exercise habits: no structured exercise -Educated on Cholesterol goals;   Importance of limiting foods high in cholesterol; Exercise goal of 150 minutes per week; -Counseled on diet and exercise extensively Recommended to continue current medication Recommended stopping fenofibrate depending on results of cholesterol panel  Diabetes (A1c goal <7%) -Controlled -Current medications: . Tradjenta 5 mg daily -Medications previously tried: none  -Current home glucose readings . fasting glucose: 80s-100s  . post prandial glucose: none -Denies hypoglycemic/hyperglycemic symptoms -Current meal patterns:  . breakfast: did not discuss  . lunch: did not discuss  . dinner: did not discuss  . snacks: did not discuss  . drinks: did not discuss  -Current exercise: no structured exercise -Educated on A1c and blood sugar goals; Benefits of routine self-monitoring of blood sugar; -Counseled to check feet daily and get yearly eye exams -Counseled on diet and exercise extensively Recommended to continue current medication Recommended making an appointment with eye doctor for diabetic eye exam  Atrial Fibrillation (Goal: prevent stroke and major bleeding) -Controlled -CHADSVASC: 5 -Current treatment:  Rate control: Carvedilol 12.5 mg twice daily  . Anticoagulation: Eliquis 2.5 mg 1 tablet twice daily -Medications previously tried: n/a -Home BP and HR readings: does not check consistently  -Counseled on bleeding risk associated with Eliquis and importance of self-monitoring for signs/symptoms of bleeding; avoidance of NSAIDs due to increased bleeding risk with anticoagulants; -Recommended to continue current medication  COPD (Goal: control symptoms and prevent exacerbations) -Controlled -Current treatment  . Ventolin HFA as needed -Medications previously tried: n/a  -Current COPD Classification:  A (low sx, <2 exacerbations/yr) -MMRC/CAT score: n/a -Pulmonary function testing: n/a -Exacerbations requiring treatment in last 6 months: none -Patient denies  consistent use of maintenance inhaler -Frequency of rescue inhaler use: not often -Counseled on When to use rescue inhaler -Recommended to continue current medication  Hypothyroidism (Goal: TSH 0.35-4.5) -Controlled -Current treatment  . Levothyroxine 112 mcg daily  -Medications previously tried: none  -Recommended to continue current medication  GERD (Goal: minimize symptoms) -Controlled -Current treatment  . Pantoprazole 40 mg daily  -Medications previously tried: none  -Recommended to continue current medication Patient denies dysphagia, heartburn or nausea. Expresses understanding to avoid triggers such as citrus juices, large meals and lying down after eating.  Chronic pain (Goal: minimize symptoms) -Controlled -Current treatment   APAP 500 mg every 6 hours as needed  Voltaren 1% gel   Emla Cream   Oxycodone-APAP 5-325 mg every 8 hours as needed -Medications previously tried: n/a -Recommended to continue current medication  Chronic constipation(Goal: regular bowel movements) -Controlled -Current treatment   Doxepin 27m/mL 3 mg QHS   Loperamide 2 mg  -Medications previously tried: dicyclomine (ineffective) -Recommended to continue current medication   Health Maintenance -Vaccine gaps: shingles, COVID -Current therapy:   Nitroglycerin as needed  Potassium 20 mEq daily   Prochlorperazine 10 mg as needed  Ferrous Sulfate 65 mg 1 tablet nightly -Educated on Cost vs benefit of each product must be carefully weighed by individual consumer -Patient is satisfied with current therapy and denies issues -Recommended to continue current medication  Patient Goals/Self-Care Activities . Patient will:  - take medications as prescribed check blood pressure weekly, document, and provide at future appointments target a minimum of 150 minutes of moderate intensity exercise weekly  Follow Up Plan: Telephone follow up appointment with care management team member  scheduled for: : 6 months      Medication Assistance: None required.  Patient affirms current coverage meets needs.  [id number] [place]: [phone]: [phone] pill box? Yes  Pt endorses 99% compliance  We discussed: Current pharmacy is preferred with insurance plan and patient  is satisfied with pharmacy services Patient decided to: Continue current medication management strategy  Care Plan and Follow Up Patient Decision:  Patient agrees to Care Plan and Follow-up.  Plan: Telephone follow up appointment with care management team member scheduled for:  6 months  Jeni Salles, PharmD [facility] Pharmacist [place] at [place] [phone]

## 2021-01-17 ENCOUNTER — Other Ambulatory Visit: Payer: Self-pay

## 2021-01-17 ENCOUNTER — Ambulatory Visit (INDEPENDENT_AMBULATORY_CARE_PROVIDER_SITE_OTHER): Payer: Medicare Other | Admitting: Family Medicine

## 2021-01-17 ENCOUNTER — Encounter: Payer: Self-pay | Admitting: Family Medicine

## 2021-01-17 VITALS — BP 116/70 | HR 67 | Temp 98.4°F | Resp 16 | Ht 62.0 in | Wt 105.2 lb

## 2021-01-17 DIAGNOSIS — R6889 Other general symptoms and signs: Secondary | ICD-10-CM | POA: Diagnosis not present

## 2021-01-17 DIAGNOSIS — E038 Other specified hypothyroidism: Secondary | ICD-10-CM

## 2021-01-17 DIAGNOSIS — I480 Paroxysmal atrial fibrillation: Secondary | ICD-10-CM | POA: Diagnosis not present

## 2021-01-17 DIAGNOSIS — E1169 Type 2 diabetes mellitus with other specified complication: Secondary | ICD-10-CM | POA: Diagnosis not present

## 2021-01-17 DIAGNOSIS — Z0000 Encounter for general adult medical examination without abnormal findings: Secondary | ICD-10-CM | POA: Diagnosis not present

## 2021-01-17 DIAGNOSIS — Z780 Asymptomatic menopausal state: Secondary | ICD-10-CM | POA: Diagnosis not present

## 2021-01-17 DIAGNOSIS — F17200 Nicotine dependence, unspecified, uncomplicated: Secondary | ICD-10-CM

## 2021-01-17 DIAGNOSIS — J42 Unspecified chronic bronchitis: Secondary | ICD-10-CM | POA: Diagnosis not present

## 2021-01-17 DIAGNOSIS — E785 Hyperlipidemia, unspecified: Secondary | ICD-10-CM | POA: Diagnosis not present

## 2021-01-17 DIAGNOSIS — I119 Hypertensive heart disease without heart failure: Secondary | ICD-10-CM

## 2021-01-17 DIAGNOSIS — E890 Postprocedural hypothyroidism: Secondary | ICD-10-CM

## 2021-01-17 DIAGNOSIS — H2513 Age-related nuclear cataract, bilateral: Secondary | ICD-10-CM | POA: Diagnosis not present

## 2021-01-17 LAB — LIPID PANEL
Cholesterol: 116 mg/dL (ref 0–200)
HDL: 63.7 mg/dL (ref >39.00)
LDL Cholesterol: 43 mg/dL (ref 0–99)
NonHDL: 52.7
Total CHOL/HDL Ratio: 2
Triglycerides: 49 mg/dL (ref 0.0–149.0)
VLDL: 9.8 mg/dL (ref 0.0–40.0)

## 2021-01-17 LAB — HEMOGLOBIN A1C: Hgb A1c MFr Bld: 6.2 % (ref 4.6–6.5)

## 2021-01-17 MED ORDER — LOSARTAN POTASSIUM 100 MG PO TABS: 100.0000 mg | ORAL_TABLET | Freq: Every day | ORAL | 2 refills | Status: DC

## 2021-01-17 NOTE — Patient Instructions (Addendum)
A few things to remember from today's visit:   Routine general medical examination at a health care facility  Type 2 diabetes mellitus with other specified complication, without long-term current use of insulin ([place]) - Plan: Fructosamine, Hemoglobin A1c, Lipid panel  Hypertension with heart disease - Plan: losartan (COZAAR) 100 MG tablet  Hyperlipidemia associated with type 2 diabetes mellitus ([place])  Other specified hypothyroidism  Asymptomatic postmenopausal estrogen deficiency - Plan: DEXAScan  Age-related nuclear cataract of both eyes - Plan: Ambulatory referral to Ophthalmology  If you need refills please call your pharmacy. Do not use My Chart to request refills or for acute issues that need immediate attention.   Albuterol inh 2 puff every 6 hours for a week then as needed for wheezing or shortness of breath.  Smoking cessation encouraged.  Please be sure medication list is accurate. If a new problem present, please set up appointment sooner than planned today.  A few tips:  -As we age balance is not as good as it was, so there is a higher risks for falls. Please remove small rugs and furniture that is "in your way" and could increase the risk of falls. Stretching exercises may help with fall prevention: Yoga and Tai Chi are some examples. Low impact exercise is better, so you are not very achy the next day.  -Sun screen and avoidance of direct sun light recommended. Caution with dehydration, if working outdoors be sure to drink enough fluids.  - Some medications are not safe as we age, increases the risk of side effects and can potentially interact with other medication you are also taken;  including some of over the counter medications. Be sure to let me know when you start a new medication even if it is a dietary/vitamin supplement.   -Healthy diet low in red meet/animal fat and sugar + regular physical activity is recommended.

## 2021-01-17 NOTE — Progress Notes (Addendum)
HPI: [name] is a 86 y.o. female, who is here today with her daughter for her routine physical.  Last CPE: Over a year ago. AWV on 08/16/20.  Regular exercise 3 or more time per week: She does not. Following a healthy diet: She eats what she feels like eating, small portions. She lives with her daughter and son.  Chronic medical problems: Small cell lung cancer, DM II,HTN,HLD, CAD, chronic diarrhea, and COPD among some.  Immunization History  Administered Date(s) Administered  . Fluad Quad(high Dose 65+) 10/05/2020  . Influenza, High Dose Seasonal PF 07/31/2017, 07/24/2018  . Influenza-Unspecified 07/31/2017  . Pneumococcal Polysaccharide-23 04/27/2020   DEXA: She does not remember having one. She has been told she has low bone density years ago. She is not on Ca++ or vit D supplementation.  Last follow up on 04/27/20. Since her last visit she has followed with oncologist. Small cell lung cancer, she is on immunotherapy, planning on having chest CT repeated in a few weeks. She is feeling well, tolerating treatment well.  Resumed tobacco use. Problem is trigger by stress. COPD: Occasional wheezing. Non productive cough attributed to allergies. + Rhinorrhea, eye pruritus and epiphora. She has not tried OTC antihistaminics.  DM II: She is on Trajenda 5 mg daily. Negative for polydipsia,polyuria, or polyphagia. Last HgA1C 5.4 in 03/2020. BS's 80's-120's, occasionally in the 70's.  Last eye exam 05/2020, cataract surgery was recommended but because other health issues happening at the time, she decided to hold on it. She has noted that "cloudy" vision is getting worse. She loves to color draws on her eye pad and sometimes cannot see details.  HTN: She is on Losartan 50 mg 2 tabs daily and Carvedilol 12.5 mg bid. Atrial fib on Eliquis 2.5 mg bid. She follows with Dr Tamala Julian regularly. HypoK+: She is on KLOR 20 meq daily.  Component     Latest Ref Rng & Units  12/22/2020  Sodium     135 - 145 mmol/L 144  Potassium     3.5 - 5.1 mmol/L 4.2  Chloride     98 - 111 mmol/L 110  CO2     22 - 32 mmol/L 26  Glucose     70 - 99 mg/dL 90  BUN     8 - 23 mg/dL 19  Creatinine     0.44 - 1.00 mg/dL 0.77  Calcium     8.9 - 10.3 mg/dL 8.8 (L)   Hypothyroidism: She is on Levothyroxine 112 mcg daily. Last TSH on 11/24/20 was normal at 0.47.  HLD: She is on Atorvastatin 10 mg daily and Fenofibrate 48 mg daily. Last FLP on 06/10/19: HDL 64, TG 79,LDL 76,and TC 156.  Her daughter is concerned about memory getting worse. Problem has been going on for a while. She forgets dates and conversations. According to daughter, a few years ago she was dx'ed with beginning of dementia.  Review of Systems  Constitutional: Negative for activity change, appetite change and fever.  HENT: Negative for mouth sores, nosebleeds and sore throat.   Eyes: Negative for pain and redness.  Respiratory: Negative for cough, shortness of breath and wheezing.   Cardiovascular: Negative for chest pain, palpitations and leg swelling.  Gastrointestinal: Negative for abdominal pain, nausea and vomiting.       Negative for changes in bowel habits.  Genitourinary: Negative for decreased urine volume and hematuria.  Musculoskeletal: Negative for gait problem.  Allergic/Immunologic: Positive for environmental allergies.  Neurological: Negative  for syncope, weakness and headaches.  Psychiatric/Behavioral: Negative for hallucinations. The patient is nervous/anxious.   All other systems reviewed and are negative.  Current Outpatient Medications on File Prior to Visit  Medication Sig Dispense Refill  . acetaminophen (TYLENOL) 500 MG tablet Take 500 mg by mouth every 6 (six) hours as needed for headache.    . albuterol (VENTOLIN HFA) 108 (90 Base) MCG/ACT inhaler TAKE 2 PUFFS BY MOUTH EVERY 6 HOURS AS NEEDED FOR WHEEZE OR SHORTNESS OF BREATH 1 each 1  . atorvastatin (LIPITOR) 10 MG tablet  TAKE 1 TABLET BY MOUTH EVERY DAY 90 tablet 0  . carvedilol (COREG) 12.5 MG tablet TAKE 1 TABLET (12.5 MG TOTAL) BY MOUTH 2 (TWO) TIMES DAILY WITH A MEAL. 60 tablet 2  . diclofenac sodium (VOLTAREN) 1 % GEL Apply 4 g topically 4 (four) times daily. 4 Tube 3  . ELIQUIS 2.5 MG TABS tablet TAKE 1 TABLET BY MOUTH TWICE A DAY 60 tablet 5  . fenofibrate (TRICOR) 48 MG tablet TAKE 1 TABLET BY MOUTH EVERY DAY 90 tablet 3  . glucose blood (ONETOUCH VERIO) test strip Use to test 3-4 times daily. 300 strip 4  . KLOR-CON M20 20 MEQ tablet TAKE 1 TABLET BY MOUTH TWICE A DAY FOR 2 DAYS THEN TAKE 1 TABLET BY MOUTH EVERY DAY 90 tablet 1  . levothyroxine (SYNTHROID) 112 MCG tablet Take 1 tablet (112 mcg total) by mouth daily before breakfast. 90 tablet 2  . lidocaine-prilocaine (EMLA) cream Apply 1 application topically as needed. 30 g 2  . linagliptin (TRADJENTA) 5 MG TABS tablet TAKE 1 TABLET (5 MG TOTAL) DAILY BY MOUTH. 90 tablet 1  . Loperamide HCl (IMODIUM PO) Take by mouth as needed.    . nitroGLYCERIN (NITROSTAT) 0.4 MG SL tablet Place 1 tablet (0.4 mg total) under the tongue every 5 (five) minutes x 3 doses as needed for chest pain (Max 3 doses within 15 min. Call 911). 10 tablet 0  . oxyCODONE-acetaminophen (PERCOCET/ROXICET) 5-325 MG tablet Take 1 tablet by mouth every 8 (eight) hours as needed for severe pain. 20 tablet 0  . pantoprazole (PROTONIX) 40 MG tablet TAKE 1 TABLET BY MOUTH EVERY DAY 90 tablet 1  . prochlorperazine (COMPAZINE) 10 MG tablet Take 1 tablet (10 mg total) by mouth every 6 (six) hours as needed for nausea or vomiting. 30 tablet 1   No current facility-administered medications on file prior to visit.     Past Medical History:  Diagnosis Date  . CAD (coronary artery disease), native coronary artery    PTCA of distal right coronary artery 1997 Cypher stents to circumflex 2005 Cardiac cath in 2011 with patent stent to circumflex and moderate disease elsewhere treated medically   .  Cancer ([place])   . Cardiac pacemaker in situ 12/24/2015   Original implant reportedly in 1991 for tachybradycardia syndrome, generator change in 1999, 2004 and evidently again in 2016 in [place]   . COPD (chronic obstructive pulmonary disease) ([place])   . Diabetes mellitus without complication ([place])   . Hypertension   . Hypothyroidism 03/23/2010  . Pacemaker   . Paroxysmal atrial fibrillation ([place]) 03/23/2010   CHA2DS2VASC score 5     Past Surgical History:  Procedure Laterality Date  . BIOPSY  01/08/2020   Procedure: BIOPSY;  Surgeon: Garner Nash, DO;  Location: [facility];  Service: Pulmonary;;  . BRONCHIAL BRUSHINGS  01/08/2020   Procedure: BRONCHIAL BRUSHINGS;  Surgeon: Garner Nash, DO;  Location: [facility]  ENDOSCOPY;  Service: Pulmonary;;  . BRONCHIAL WASHINGS  01/08/2020   Procedure: BRONCHIAL WASHINGS;  Surgeon: Garner Nash, DO;  Location: [facility];  Service: Pulmonary;;  . CARDIAC CATHETERIZATION N/A 12/26/2015   Procedure: Left Heart Cath and Coronary Angiography;  Surgeon: Peter M [name], MD;  Location: [facility];  Service: Cardiovascular;  Laterality: N/A;  . CARDIOVERSION  05/2015  . CHOLECYSTECTOMY  2012  . CORONARY ANGIOPLASTY WITH STENT PLACEMENT  1990  . ENDOBRONCHIAL ULTRASOUND  01/08/2020   Procedure: ENDOBRONCHIAL ULTRASOUND;  Surgeon: Garner Nash, DO;  Location: [facility];  Service: Pulmonary;;  . FINE NEEDLE ASPIRATION BIOPSY  01/08/2020   Procedure: FINE NEEDLE ASPIRATION BIOPSY;  Surgeon: Garner Nash, DO;  Location: [facility];  Service: Pulmonary;;  . HERNIA REPAIR    . IR IMAGING GUIDED PORT INSERTION  02/18/2020  . PACEMAKER GENERATOR CHANGE  [id number], 2016  . PACEMAKER INSERTION  1991  . VIDEO BRONCHOSCOPY WITH ENDOBRONCHIAL ULTRASOUND N/A 01/08/2020   Procedure: VIDEO BRONCHOSCOPY;  Surgeon: Garner Nash, DO;  Location: [facility];  Service: Pulmonary;  Laterality: N/A;    Allergies  Allergen Reactions  . Seasonal Ic  [Cholestatin]     Itchy eye and runny nose    Family History  Problem Relation Age of Onset  . Heart disease Father   . Heart disease Mother   . Heart attack Mother   . Cirrhosis Brother   . Colon cancer Neg Hx   . Stomach cancer Neg Hx     Social History   Socioeconomic History  . Marital status: Widowed    Spouse name: Not on file  . Number of children: Not on file  . Years of education: Not on file  . Highest education level: Not on file  Occupational History  . Occupation: retired  Tobacco Use  . Smoking status: Former Smoker    Packs/day: 0.75    Years: 66.00    Pack years: 49.50    Quit date: 12/27/2019    Years since quitting: 1.0  . Smokeless tobacco: Never Used  Vaping Use  . Vaping Use: Never used  Substance and Sexual Activity  . Alcohol use: No    Alcohol/week: 0.0 standard drinks  . Drug use: No  . Sexual activity: Not on file  Other Topics Concern  . Not on file  Social History Narrative  . Not on file   Social Determinants of Health   Financial Resource Strain: Low Risk   . Difficulty of Paying Living Expenses: Not hard at all  Food Insecurity: No Food Insecurity  . Worried About Charity fundraiser in the Last Year: Never true  . Ran Out of Food in the Last Year: Never true  Transportation Needs: No Transportation Needs  . Lack of Transportation (Medical): No  . Lack of Transportation (Non-Medical): No  Physical Activity: Inactive  . Days of Exercise per Week: 0 days  . Minutes of Exercise per Session: 0 min  Stress: No Stress Concern Present  . Feeling of Stress : Not at all  Social Connections: Socially Isolated  . Frequency of Communication with Friends and Family: More than three times a week  . Frequency of Social Gatherings with Friends and Family: Never  . Attends Religious Services: Never  . Active Member of Clubs or Organizations: No  . Attends Archivist Meetings: Never  . Marital Status: Widowed   Vitals:    01/17/21 0926  BP: 116/70  Pulse:  67  Temp: 98.4 F (36.9 C)  SpO2: (!) 87%   Body mass index is 19.24 kg/m.  Wt Readings from Last 3 Encounters:  01/19/21 106 lb 4.8 oz (48.2 kg)  01/17/21 105 lb 3.2 oz (47.7 kg)  12/22/20 106 lb 8 oz (48.3 kg)   Physical Exam Vitals and nursing note reviewed.  Constitutional:      General: She is not in acute distress.    Appearance: She is well-developed.  HENT:     Head: Normocephalic and atraumatic.     Mouth/Throat:     Mouth: Mucous membranes are moist.     Dentition: Has dentures.     Pharynx: Oropharynx is clear.  Eyes:     Conjunctiva/sclera: Conjunctivae normal.     Pupils: Pupils are equal.     Right eye: Pupil is sluggish.     Left eye: Pupil is sluggish.  Cardiovascular:     Rate and Rhythm: Normal rate and regular rhythm.     Pulses:          Dorsalis pedis pulses are 2+ on the right side and 2+ on the left side.     Heart sounds: No murmur heard.   Pulmonary:     Effort: Pulmonary effort is normal. No respiratory distress.     Breath sounds: Normal breath sounds.  Abdominal:     Palpations: Abdomen is soft. There is no hepatomegaly or mass.     Tenderness: There is no abdominal tenderness.  Lymphadenopathy:     Cervical: No cervical adenopathy.  Skin:    General: Skin is warm.     Findings: No erythema or rash.  Neurological:     Mental Status: She is alert.     Cranial Nerves: No cranial nerve deficit.     Gait: Gait normal.     Comments: Stable gait,not assisted.  Psychiatric:     Comments: Well groomed, good eye contact.    ASSESSMENT AND PLAN:  Ms. [name] was here today annual physical examination.  Orders Placed This Encounter  Procedures  . DEXAScan  . Fructosamine  . Hemoglobin A1c  . Lipid panel  . Ambulatory referral to Ophthalmology  . Ambulatory referral to Neuropsychology    Lab Results  Component Value Date   CHOL 116 01/17/2021   HDL 63.70 01/17/2021   LDLCALC 43 01/17/2021    TRIG 49.0 01/17/2021   CHOLHDL 2 01/17/2021   Lab Results  Component Value Date   HGBA1C 6.2 01/17/2021   Routine general medical examination at a health care facility We discussed the importance of staying active, physically and mentally, as well as the benefits of a healthy/balance diet. Low impact exercise that involve stretching and strengthing are ideal. Vaccines up to date. Encouraged smoking cessation. Ca++ an vit D supplementation recommended. Fall prevention.  Type 2 diabetes mellitus with other specified complication, without long-term current use of insulin ([place]) HgA1C has been at goal. Continue Trajenta for now but if HgA1C <6.5, we will consider stopping medication. Annual eye exam and foot care recommended. F/U in 5-6 months  Hypertension with heart disease BP adequately controlled. Monitor BP at home. Continue same dose of Losartan and Carvedilol.  -     losartan (COZAAR) 100 MG tablet; Take 1 tablet (100 mg total) by mouth daily.  Hyperlipidemia associated with type 2 diabetes mellitus (HCC) Continue Atorvastatin 10 mg daily and Fenofibrate 48 mg daily. Further recommendations according to lipid panel results.  Asymptomatic postmenopausal estrogen deficiency -  DEXAScan; Future  Age-related nuclear cataract of both eyes She would like to see a different provider, referral to ophthalmologist placed.  Forgetfulness Possible causes discussed. Neuropsychologic evaluation will be arranged. Cognitive challenging activities recommended.  Postablative hypothyroidism Problem has been stable. TSH has been checked by oncologist regularly. Continue Levothyroxine 112 mcg daily.  Chronic bronchitis, unspecified chronic bronchitis type (HCC) Albuterol inh 2 puff every 6 hours for a week then as needed for wheezing or shortness of breath.  Smocking cessation strongly recommended.  Tobacco use disorder She feels like she can quit smoking using Nicotine  patches. Some side effects discussed.  - nicotine (NICODERM CQ - DOSED IN MG/24 HOURS) 14 mg/24hr patch; Place 1 patch (14 mg total) onto the skin daily.  Dispense: 28 patch; Refill: 0  11. Paroxysmal atrial fibrillation (HCC) Today rate and rhythm controlled. Continue Carvedilol 12.5 mg bid and Eliquis 2.5 mg bid. Following with cardiologist.   Return in 6 months (on 07/19/2021) for HTN,DM II.   Robin Mccullum G. [name], MD  [facility]. [place] office.   A few things to remember from today's visit:   Type 2 diabetes mellitus with other specified complication, without long-term current use of insulin ([place])  Hypertension with heart disease  Hyperlipidemia associated with type 2 diabetes mellitus ([place])  Other specified hypothyroidism  If you need refills please call your pharmacy. Do not use My Chart to request refills or for acute issues that need immediate attention.    Please be sure medication list is accurate. If a new problem present, please set up appointment sooner than planned today.  A few tips:  -As we age balance is not as good as it was, so there is a higher risks for falls. Please remove small rugs and furniture that is "in your way" and could increase the risk of falls. Stretching exercises may help with fall prevention: Yoga and Tai Chi are some examples. Low impact exercise is better, so you are not very achy the next day.  -Sun screen and avoidance of direct sun light recommended. Caution with dehydration, if working outdoors be sure to drink enough fluids.  - Some medications are not safe as we age, increases the risk of side effects and can potentially interact with other medication you are also taken;  including some of over the counter medications. Be sure to let me know when you start a new medication even if it is a dietary/vitamin supplement.   -Healthy diet low in red meet/animal fat and sugar + regular physical activity is recommended.

## 2021-01-19 ENCOUNTER — Other Ambulatory Visit: Payer: Self-pay

## 2021-01-19 ENCOUNTER — Inpatient Hospital Stay: Payer: Medicare Other | Attending: Internal Medicine

## 2021-01-19 ENCOUNTER — Inpatient Hospital Stay: Payer: Medicare Other

## 2021-01-19 ENCOUNTER — Inpatient Hospital Stay (HOSPITAL_BASED_OUTPATIENT_CLINIC_OR_DEPARTMENT_OTHER): Payer: Medicare Other | Admitting: Internal Medicine

## 2021-01-19 ENCOUNTER — Encounter: Payer: Self-pay | Admitting: Internal Medicine

## 2021-01-19 VITALS — BP 128/50 | HR 61 | Temp 97.3°F | Resp 18 | Ht 62.0 in | Wt 106.3 lb

## 2021-01-19 DIAGNOSIS — C3491 Malignant neoplasm of unspecified part of right bronchus or lung: Secondary | ICD-10-CM | POA: Diagnosis not present

## 2021-01-19 DIAGNOSIS — C7951 Secondary malignant neoplasm of bone: Secondary | ICD-10-CM | POA: Insufficient documentation

## 2021-01-19 DIAGNOSIS — Z5112 Encounter for antineoplastic immunotherapy: Secondary | ICD-10-CM | POA: Diagnosis not present

## 2021-01-19 DIAGNOSIS — Z79899 Other long term (current) drug therapy: Secondary | ICD-10-CM | POA: Diagnosis not present

## 2021-01-19 DIAGNOSIS — Z95828 Presence of other vascular implants and grafts: Secondary | ICD-10-CM

## 2021-01-19 DIAGNOSIS — C3411 Malignant neoplasm of upper lobe, right bronchus or lung: Secondary | ICD-10-CM | POA: Insufficient documentation

## 2021-01-19 LAB — CBC WITH DIFFERENTIAL (CANCER CENTER ONLY)
Abs Immature Granulocytes: 0.01 10*3/uL (ref 0.00–0.07)
Basophils Absolute: 0 10*3/uL (ref 0.0–0.1)
Basophils Relative: 1 %
Eosinophils Absolute: 0.1 10*3/uL (ref 0.0–0.5)
Eosinophils Relative: 3 %
HCT: 33.8 % — ABNORMAL LOW (ref 36.0–46.0)
Hemoglobin: 10.8 g/dL — ABNORMAL LOW (ref 12.0–15.0)
Immature Granulocytes: 0 %
Lymphocytes Relative: 37 %
Lymphs Abs: 1.5 10*3/uL (ref 0.7–4.0)
MCH: 30.3 pg (ref 26.0–34.0)
MCHC: 32 g/dL (ref 30.0–36.0)
MCV: 94.9 fL (ref 80.0–100.0)
Monocytes Absolute: 0.7 10*3/uL (ref 0.1–1.0)
Monocytes Relative: 16 %
Neutro Abs: 1.8 10*3/uL (ref 1.7–7.7)
Neutrophils Relative %: 43 %
Platelet Count: 134 10*3/uL — ABNORMAL LOW (ref 150–400)
RBC: 3.56 MIL/uL — ABNORMAL LOW (ref 3.87–5.11)
RDW: 13.9 % (ref 11.5–15.5)
WBC Count: 4.1 10*3/uL (ref 4.0–10.5)
nRBC: 0 % (ref 0.0–0.2)

## 2021-01-19 LAB — CMP (CANCER CENTER ONLY)
ALT: 18 U/L (ref 0–44)
AST: 20 U/L (ref 15–41)
Albumin: 3.5 g/dL (ref 3.5–5.0)
Alkaline Phosphatase: 87 U/L (ref 38–126)
Anion gap: 10 (ref 5–15)
BUN: 19 mg/dL (ref 8–23)
CO2: 25 mmol/L (ref 22–32)
Calcium: 9 mg/dL (ref 8.9–10.3)
Chloride: 110 mmol/L (ref 98–111)
Creatinine: 0.77 mg/dL (ref 0.44–1.00)
GFR, Estimated: >60 mL/min (ref >60)
Glucose, Bld: 100 mg/dL — ABNORMAL HIGH (ref 70–99)
Potassium: 4.5 mmol/L (ref 3.5–5.1)
Sodium: 145 mmol/L (ref 135–145)
Total Bilirubin: 0.3 mg/dL (ref 0.3–1.2)
Total Protein: 6.6 g/dL (ref 6.5–8.1)

## 2021-01-19 LAB — TSH: TSH: 1.014 u[IU]/mL (ref 0.308–3.960)

## 2021-01-19 LAB — FRUCTOSAMINE: Fructosamine: 254 umol/L (ref 205–285)

## 2021-01-19 MED ORDER — HEPARIN SOD (PORK) LOCK FLUSH 100 UNIT/ML IV SOLN
500.0000 [IU] | Freq: Once | INTRAVENOUS | Status: AC | PRN
  Administered 2021-01-19: 500 [IU]
  Filled 2021-01-19: qty 5

## 2021-01-19 MED ORDER — SODIUM CHLORIDE 0.9% FLUSH
10.0000 mL | Freq: Once | INTRAVENOUS | Status: AC
  Administered 2021-01-19: 10 mL
  Filled 2021-01-19: qty 10

## 2021-01-19 MED ORDER — SODIUM CHLORIDE 0.9% FLUSH
10.0000 mL | INTRAVENOUS | Status: DC | PRN
  Administered 2021-01-19: 10 mL
  Filled 2021-01-19: qty 10

## 2021-01-19 MED ORDER — SODIUM CHLORIDE 0.9 % IV SOLN
Freq: Once | INTRAVENOUS | Status: AC
  Filled 2021-01-19: qty 250

## 2021-01-19 MED ORDER — SODIUM CHLORIDE 0.9 % IV SOLN
1500.0000 mg | Freq: Once | INTRAVENOUS | Status: AC
  Administered 2021-01-19: 1500 mg via INTRAVENOUS
  Filled 2021-01-19: qty 30

## 2021-01-19 MED ORDER — NICOTINE 14 MG/24HR TD PT24: 14.0000 mg | MEDICATED_PATCH | Freq: Every day | TRANSDERMAL | 0 refills | Status: DC

## 2021-01-19 NOTE — Progress Notes (Signed)
[place] [phone]   [phone]  OFFICE PROGRESS NOTE  [name], [name], MD [address] [place] [address]  DIAGNOSIS: Extensive stage (T2 a, N2, M1 C) small cell lung cancer presented with central right upper lobe lung mass in addition to left lower lobe pulmonary metastasis and bilateral hilar, subcarinal and bilateral paratracheal and left prevascular lymphadenopathy in addition to bone metastasis in the left iliac wing diagnosed in April 2021.  PRIOR THERAPY: None  CURRENT THERAPY: Systemic chemotherapy with carboplatin for AUC of 5 on day 1, etoposide 100 mg/M2 on days 1, 2 and 3 with Neulasta support in addition to Imfinzi 1500 mg IV every 3 weeks during chemotherapy followed by maintenance Imfinzi 1500 mg IV every 4 weeks after cycle #4.  Status post 13 cycles.  INTERVAL HISTORY: [name] 86 y.o. female returns to the clinic today for follow-up visit.  The patient is feeling fine today with no concerning complaints except for mild rash on the skin of the abdomen.  She denied having any current chest pain, shortness of breath, cough or hemoptysis.  She denied having any fever or chills.  She has no nausea, vomiting, diarrhea or constipation.  She has no headache or visual changes.  She denied having any significant weight loss or night sweats.  She is here today for evaluation before starting cycle #14 of her treatment.    MEDICAL HISTORY: Past Medical History:  Diagnosis Date  . CAD (coronary artery disease), native coronary artery    PTCA of distal right coronary artery 1997 Cypher stents to circumflex 2005 Cardiac cath in 2011 with patent stent to circumflex and moderate disease elsewhere treated medically   . Cancer ([place])   . Cardiac pacemaker in situ 12/24/2015   Original implant reportedly in 1991 for tachybradycardia syndrome, generator change in 1999, 2004 and evidently again in 2016 in [place]   . COPD (chronic  obstructive pulmonary disease) ([place])   . Diabetes mellitus without complication ([place])   . Hypertension   . Hypothyroidism 03/23/2010  . Pacemaker   . Paroxysmal atrial fibrillation ([place]) 03/23/2010   CHA2DS2VASC score 5     ALLERGIES:  is allergic to seasonal ic [cholestatin].  MEDICATIONS:  Current Outpatient Medications  Medication Sig Dispense Refill  . acetaminophen (TYLENOL) 500 MG tablet Take 500 mg by mouth every 6 (six) hours as needed for headache.    . albuterol (VENTOLIN HFA) 108 (90 Base) MCG/ACT inhaler TAKE 2 PUFFS BY MOUTH EVERY 6 HOURS AS NEEDED FOR WHEEZE OR SHORTNESS OF BREATH 1 each 1  . atorvastatin (LIPITOR) 10 MG tablet TAKE 1 TABLET BY MOUTH EVERY DAY 90 tablet 0  . carvedilol (COREG) 12.5 MG tablet TAKE 1 TABLET (12.5 MG TOTAL) BY MOUTH 2 (TWO) TIMES DAILY WITH A MEAL. 60 tablet 2  . diclofenac sodium (VOLTAREN) 1 % GEL Apply 4 g topically 4 (four) times daily. 4 Tube 3  . doxepin (SINEQUAN) 10 MG/ML solution Take 0.3 mLs (3 mg total) by mouth at bedtime. 30 mL 1  . ELIQUIS 2.5 MG TABS tablet TAKE 1 TABLET BY MOUTH TWICE A DAY 60 tablet 5  . fenofibrate (TRICOR) 48 MG tablet TAKE 1 TABLET BY MOUTH EVERY DAY 90 tablet 3  . glucose blood (ONETOUCH VERIO) test strip Use to test 3-4 times daily. 300 strip 4  . KLOR-CON M20 20 MEQ tablet TAKE 1 TABLET BY MOUTH TWICE A DAY FOR 2 DAYS THEN  TAKE 1 TABLET BY MOUTH EVERY DAY 90 tablet 1  . levothyroxine (SYNTHROID) 112 MCG tablet Take 1 tablet (112 mcg total) by mouth daily before breakfast. 90 tablet 2  . lidocaine-prilocaine (EMLA) cream Apply 1 application topically as needed. 30 g 2  . linagliptin (TRADJENTA) 5 MG TABS tablet TAKE 1 TABLET (5 MG TOTAL) DAILY BY MOUTH. 90 tablet 1  . Loperamide HCl (IMODIUM PO) Take by mouth as needed.    Marland Kitchen losartan (COZAAR) 100 MG tablet Take 1 tablet (100 mg total) by mouth daily. 90 tablet 2  . nitroGLYCERIN (NITROSTAT) 0.4 MG SL tablet Place 1 tablet (0.4 mg total) under the tongue  every 5 (five) minutes x 3 doses as needed for chest pain (Max 3 doses within 15 min. Call 911). 10 tablet 0  . oxyCODONE-acetaminophen (PERCOCET/ROXICET) 5-325 MG tablet Take 1 tablet by mouth every 8 (eight) hours as needed for severe pain. 20 tablet 0  . pantoprazole (PROTONIX) 40 MG tablet TAKE 1 TABLET BY MOUTH EVERY DAY 90 tablet 1  . prochlorperazine (COMPAZINE) 10 MG tablet Take 1 tablet (10 mg total) by mouth every 6 (six) hours as needed for nausea or vomiting. 30 tablet 1   No current facility-administered medications for this visit.    SURGICAL HISTORY:  Past Surgical History:  Procedure Laterality Date  . BIOPSY  01/08/2020   Procedure: BIOPSY;  Surgeon: Garner Nash, DO;  Location: [facility];  Service: Pulmonary;;  . BRONCHIAL BRUSHINGS  01/08/2020   Procedure: BRONCHIAL BRUSHINGS;  Surgeon: Garner Nash, DO;  Location: [facility];  Service: Pulmonary;;  . BRONCHIAL WASHINGS  01/08/2020   Procedure: BRONCHIAL WASHINGS;  Surgeon: Garner Nash, DO;  Location: [facility];  Service: Pulmonary;;  . CARDIAC CATHETERIZATION N/A 12/26/2015   Procedure: Left Heart Cath and Coronary Angiography;  Surgeon: Peter M [name], MD;  Location: [facility];  Service: Cardiovascular;  Laterality: N/A;  . CARDIOVERSION  05/2015  . CHOLECYSTECTOMY  2012  . CORONARY ANGIOPLASTY WITH STENT PLACEMENT  1990  . ENDOBRONCHIAL ULTRASOUND  01/08/2020   Procedure: ENDOBRONCHIAL ULTRASOUND;  Surgeon: Garner Nash, DO;  Location: [facility];  Service: Pulmonary;;  . FINE NEEDLE ASPIRATION BIOPSY  01/08/2020   Procedure: FINE NEEDLE ASPIRATION BIOPSY;  Surgeon: Garner Nash, DO;  Location: [facility];  Service: Pulmonary;;  . HERNIA REPAIR    . IR IMAGING GUIDED PORT INSERTION  02/18/2020  . PACEMAKER GENERATOR CHANGE  [id number], 2016  . PACEMAKER INSERTION  1991  . VIDEO BRONCHOSCOPY WITH ENDOBRONCHIAL ULTRASOUND N/A 01/08/2020   Procedure: VIDEO BRONCHOSCOPY;  Surgeon: Garner Nash, DO;  Location: [facility];  Service: Pulmonary;  Laterality: N/A;    REVIEW OF SYSTEMS:  A comprehensive review of systems was negative except for: Integument/breast: positive for rash   PHYSICAL EXAMINATION: General appearance: alert, cooperative, appears stated age and no distress Head: Normocephalic, without obvious abnormality, atraumatic Neck: no adenopathy, no JVD, supple, symmetrical, trachea midline and thyroid not enlarged, symmetric, no tenderness/mass/nodules Lymph nodes: Cervical, supraclavicular, and axillary nodes normal. Resp: clear to auscultation bilaterally Back: symmetric, no curvature. ROM normal. No CVA tenderness. Cardio: regular rate and rhythm, S1, S2 normal, no murmur, click, rub or gallop GI: soft, non-tender; bowel sounds normal; no masses,  no organomegaly Extremities: extremities normal, atraumatic, no cyanosis or edema  ECOG PERFORMANCE STATUS: 1 - Symptomatic but completely ambulatory  Blood pressure (!) 128/50, pulse 61, temperature (!) 97.3 F (36.3 C), temperature source Tympanic,  resp. rate 18, height 5\' 2"  (1.575 m), weight 106 lb 4.8 oz (48.2 kg).  LABORATORY DATA: Lab Results  Component Value Date   WBC 4.1 01/19/2021   HGB 10.8 (L) 01/19/2021   HCT 33.8 (L) 01/19/2021   MCV 94.9 01/19/2021   PLT 134 (L) 01/19/2021      Chemistry      Component Value Date/Time   NA 144 12/22/2020 0846   NA 143 03/17/2020 0915   K 4.2 12/22/2020 0846   CL 110 12/22/2020 0846   CO2 26 12/22/2020 0846   BUN 19 12/22/2020 0846   BUN 6 (L) 03/17/2020 0915   CREATININE 0.77 12/22/2020 0846      Component Value Date/Time   CALCIUM 8.8 (L) 12/22/2020 0846   ALKPHOS 80 12/22/2020 0846   AST 17 12/22/2020 0846   ALT 18 12/22/2020 0846   BILITOT 0.3 12/22/2020 0846       RADIOGRAPHIC STUDIES: CT Chest W Contrast  Result Date: 12/21/2020 CLINICAL DATA:  Small-cell lung cancer, staging. Right upper lobe lung mass and left lower lobe  pulmonary metastasis. EXAM: CT CHEST, ABDOMEN, AND PELVIS WITH CONTRAST TECHNIQUE: Multidetector CT imaging of the chest, abdomen and pelvis was performed following the standard protocol during bolus administration of intravenous contrast. CONTRAST:  173mL OMNIPAQUE IOHEXOL 300 MG/ML  SOLN COMPARISON:  09/16/2020 FINDINGS: CT CHEST FINDINGS Cardiovascular: Left Port-A-Cath tip at high right atrium. Dual lead pacer. Aortic atherosclerosis. Multivessel coronary artery atherosclerosis. Mediastinum/Nodes: No supraclavicular adenopathy. No mediastinal or hilar adenopathy. Esophageal fluid level on 28/2. Lungs/Pleura: No pleural fluid.  Advanced bullous emphysema. Right medial upper lobe 1.9 x 1.9 cm irregular nodule on 40/6 is decreased from 2.1 x 1.9 cm on the prior exam (when remeasured). Tiny calcified granuloma in the left upper lobe including on 30/6. Triangular left lower lobe pulmonary nodule of 8 mm on 63/6 is unchanged. Posterior left upper lobe 2 mm nodule is also likely a calcified granuloma on 33/6. Musculoskeletal: No acute osseous abnormality. CT ABDOMEN PELVIS FINDINGS Hepatobiliary: Normal liver. Cholecystectomy, without biliary ductal dilatation. Pancreas: Mild pancreatic atrophy with borderline duct dilatation, similar. Spleen: Normal in size, without focal abnormality. Adrenals/Urinary Tract: Normal adrenal glands. Interpolar left renal 1.2 cm low-density lesion is likely a cyst or minimally complex cyst. Normal right kidney, without hydronephrosis. Degraded evaluation of the pelvis, secondary to beam hardening artifact from right femur fixation. Grossly normal urinary bladder. Stomach/Bowel: Normal stomach, without wall thickening. Scattered colonic diverticula. Normal terminal ileum and appendix. Normal small bowel. Vascular/Lymphatic: Advanced aortic and branch vessel atherosclerosis. No abdominopelvic adenopathy. Reproductive: Normal uterus and adnexa. Prominent gonadal veins, especially on the  left. Other: No significant free fluid. Mild pelvic floor laxity. No evidence of omental or peritoneal disease. Musculoskeletal: Osteopenia.  Right proximal femur fixation. IMPRESSION: 1. Decreased size of a right upper lobe lung nodule. Similar size of a left lower lobe pulmonary nodule. 2. No thoracic adenopathy. 3. No acute process or evidence of metastatic disease in the abdomen or pelvis. 4. Aortic atherosclerosis (ICD10-I70.0), coronary artery atherosclerosis and emphysema (ICD10-J43.9). 5. Esophageal air fluid level suggests dysmotility or gastroesophageal reflux. 6. Prominent gonadal veins, as can be seen with pelvic congestion syndrome. Electronically Signed   By: Abigail Miyamoto M.D.   On: 12/21/2020 13:32   CT Abdomen Pelvis W Contrast  Result Date: 12/21/2020 CLINICAL DATA:  Small-cell lung cancer, staging. Right upper lobe lung mass and left lower lobe pulmonary metastasis. EXAM: CT CHEST, ABDOMEN, AND PELVIS WITH CONTRAST TECHNIQUE: Multidetector  CT imaging of the chest, abdomen and pelvis was performed following the standard protocol during bolus administration of intravenous contrast. CONTRAST:  151mL OMNIPAQUE IOHEXOL 300 MG/ML  SOLN COMPARISON:  09/16/2020 FINDINGS: CT CHEST FINDINGS Cardiovascular: Left Port-A-Cath tip at high right atrium. Dual lead pacer. Aortic atherosclerosis. Multivessel coronary artery atherosclerosis. Mediastinum/Nodes: No supraclavicular adenopathy. No mediastinal or hilar adenopathy. Esophageal fluid level on 28/2. Lungs/Pleura: No pleural fluid.  Advanced bullous emphysema. Right medial upper lobe 1.9 x 1.9 cm irregular nodule on 40/6 is decreased from 2.1 x 1.9 cm on the prior exam (when remeasured). Tiny calcified granuloma in the left upper lobe including on 30/6. Triangular left lower lobe pulmonary nodule of 8 mm on 63/6 is unchanged. Posterior left upper lobe 2 mm nodule is also likely a calcified granuloma on 33/6. Musculoskeletal: No acute osseous abnormality.  CT ABDOMEN PELVIS FINDINGS Hepatobiliary: Normal liver. Cholecystectomy, without biliary ductal dilatation. Pancreas: Mild pancreatic atrophy with borderline duct dilatation, similar. Spleen: Normal in size, without focal abnormality. Adrenals/Urinary Tract: Normal adrenal glands. Interpolar left renal 1.2 cm low-density lesion is likely a cyst or minimally complex cyst. Normal right kidney, without hydronephrosis. Degraded evaluation of the pelvis, secondary to beam hardening artifact from right femur fixation. Grossly normal urinary bladder. Stomach/Bowel: Normal stomach, without wall thickening. Scattered colonic diverticula. Normal terminal ileum and appendix. Normal small bowel. Vascular/Lymphatic: Advanced aortic and branch vessel atherosclerosis. No abdominopelvic adenopathy. Reproductive: Normal uterus and adnexa. Prominent gonadal veins, especially on the left. Other: No significant free fluid. Mild pelvic floor laxity. No evidence of omental or peritoneal disease. Musculoskeletal: Osteopenia.  Right proximal femur fixation. IMPRESSION: 1. Decreased size of a right upper lobe lung nodule. Similar size of a left lower lobe pulmonary nodule. 2. No thoracic adenopathy. 3. No acute process or evidence of metastatic disease in the abdomen or pelvis. 4. Aortic atherosclerosis (ICD10-I70.0), coronary artery atherosclerosis and emphysema (ICD10-J43.9). 5. Esophageal air fluid level suggests dysmotility or gastroesophageal reflux. 6. Prominent gonadal veins, as can be seen with pelvic congestion syndrome. Electronically Signed   By: Abigail Miyamoto M.D.   On: 12/21/2020 13:32    ASSESSMENT AND PLAN: This is a very pleasant 86 years old African-American female recently diagnosed with extensive stage small cell lung cancer presented with central right upper lobe lung mass in addition to left lower lobe lung nodule and mediastinal lymphadenopathy and metastatic bone lesion in the left iliac wing diagnosed in April  2021. The patient started her treatment today with carboplatin for AUC of 5 on day 1, etoposide 100 mg/M2 on days 1, 2 and 3 with Neulasta support in addition to Imfinzi 1500 mg every 3 weeks during the chemotherapy course followed by 1500 mg every 4 weeks as maintenance. Status post 13 cycles.  Starting from cycle #5 she is on maintenance treatment with Imfinzi every 4 weeks.  The patient has been tolerating this treatment well with no concerning adverse effects. I recommended for her to proceed with cycle #14 today as planned. I will see her back for follow-up visit in 4 weeks for evaluation before the next cycle of her treatment. She was advised to call immediately if she has any concerning symptoms in the interval. The patient voices understanding of current disease status and treatment options and is in agreement with the current care plan.  All questions were answered. The patient knows to call the clinic with any problems, questions or concerns. We can certainly see the patient much sooner if necessary.  Disclaimer: This  note was dictated with voice recognition software. Similar sounding words can inadvertently be transcribed and may not be corrected upon review.

## 2021-01-19 NOTE — Patient Instructions (Signed)
[facility] Discharge Instructions for Patients Receiving Chemotherapy  Today you received the following chemotherapy agents: Imfinzi.  To help prevent nausea and vomiting after your treatment, we encourage you to take your nausea medication as directed.   If you develop nausea and vomiting that is not controlled by your nausea medication, call the clinic.   BELOW ARE SYMPTOMS THAT SHOULD BE REPORTED IMMEDIATELY:  *FEVER GREATER THAN 100.5 F  *CHILLS WITH OR WITHOUT FEVER  NAUSEA AND VOMITING THAT IS NOT CONTROLLED WITH YOUR NAUSEA MEDICATION  *UNUSUAL SHORTNESS OF BREATH  *UNUSUAL BRUISING OR BLEEDING  TENDERNESS IN MOUTH AND THROAT WITH OR WITHOUT PRESENCE OF ULCERS  *URINARY PROBLEMS  *BOWEL PROBLEMS  UNUSUAL RASH Items with * indicate a potential emergency and should be followed up as soon as possible.  Feel free to call the clinic should you have any questions or concerns. The clinic phone number is [phone].  Please show the CHEMO ALERT CARD at check-in to the Emergency Department and triage nurse.   

## 2021-01-19 NOTE — Addendum Note (Signed)
Addended by: Ardeen Garland on: 01/19/2021 10:58 AM   Modules accepted: Orders

## 2021-01-19 NOTE — Patient Instructions (Addendum)
Hi [name],  It was great to get to meet you over the telephone! Below is a summary of some of the topics we discussed.   Please reach out to me if you have any questions or need anything before our follow up!  Best, Maddie  Jeni Salles, PharmD, [facility]  Visit Information  Goals Addressed   None    Patient Care Plan: CCM Pharmacy Care Plan    Problem Identified: Problem: Hypertension, Hyperlipidemia, Diabetes, Atrial Fibrillation, Coronary Artery Disease, GERD, COPD, Hypothyroidism and Chronic Diarrhea and Lung Cancer     Long-Range Goal: Patient-Specific Goal   Start Date: 01/05/2021  Expected End Date: 01/05/2022  This Visit's Progress: On track  Priority: High  Note:   Current Barriers:  . Unable to independently monitor therapeutic efficacy . Suboptimal therapeutic regimen for cholesterol  Pharmacist Clinical Goal(s):  Marland Kitchen Patient will achieve adherence to monitoring guidelines and medication adherence to achieve therapeutic efficacy . maintain control of blood pressure as evidenced by home blood pressure readings  through collaboration with PharmD and provider.   Interventions: . 1:1 collaboration with [name], [name], MD regarding development and update of comprehensive plan of care as evidenced by provider attestation and co-signature . Inter-disciplinary care team collaboration (see longitudinal plan of care) . Comprehensive medication review performed; medication list updated in electronic medical record  Hypertension (BP goal <140/90) -Uncontrolled -Current treatment:  Carvedilol 12.5 mg twice daily   Losartan 50 mg 2 tablets daily -Medications previously tried: none  -Current home readings: not checking consistently but her granddaughter can -Current dietary habits: limits salt intake; cut down on meat; loves broccoli and loves cabbage  -Current exercise habits: no structured exercise -Denies  hypotensive/hypertensive symptoms -Educated on Importance of home blood pressure monitoring; Proper BP monitoring technique; -Counseled to monitor BP at home weekly, document, and provide log at future appointments -Counseled on diet and exercise extensively Recommended to continue current medication Recommended switching to losartan 100 mg tablets. Request sent to [facility].  Hyperlipidemia: (LDL goal < 70) -Controlled -Current treatment:  Atorvastatin 10 mg daily   Fenofibrate 48 mg daily -Medications previously tried: none  -Current dietary patterns: has cut down on red meat -Current exercise habits: no structured exercise -Educated on Cholesterol goals;  Importance of limiting foods high in cholesterol; Exercise goal of 150 minutes per week; -Counseled on diet and exercise extensively Recommended to continue current medication Recommended stopping fenofibrate depending on results of cholesterol panel  Diabetes (A1c goal <7%) -Controlled -Current medications: . Tradjenta 5 mg daily -Medications previously tried: none  -Current home glucose readings . fasting glucose: 80s-100s  . post prandial glucose: none -Denies hypoglycemic/hyperglycemic symptoms -Current meal patterns:  . breakfast: did not discuss  . lunch: did not discuss  . dinner: did not discuss  . snacks: did not discuss  . drinks: did not discuss  -Current exercise: no structured exercise -Educated on A1c and blood sugar goals; Benefits of routine self-monitoring of blood sugar; -Counseled to check feet daily and get yearly eye exams -Counseled on diet and exercise extensively Recommended to continue current medication Recommended making an appointment with eye doctor for diabetic eye exam  Atrial Fibrillation (Goal: prevent stroke and major bleeding) -Controlled -CHADSVASC: 5 -Current treatment:  Rate control: Carvedilol 12.5 mg twice daily  . Anticoagulation: Eliquis 2.5 mg 1 tablet twice  daily -Medications previously tried: n/a -Home BP and HR readings: does not check consistently  -Counseled on bleeding risk associated with  Eliquis and importance of self-monitoring for signs/symptoms of bleeding; avoidance of NSAIDs due to increased bleeding risk with anticoagulants; -Recommended to continue current medication  COPD (Goal: control symptoms and prevent exacerbations) -Controlled -Current treatment  . Ventolin HFA as needed -Medications previously tried: n/a  -Current COPD Classification:  A (low sx, <2 exacerbations/yr) -MMRC/CAT score: n/a -Pulmonary function testing: n/a -Exacerbations requiring treatment in last 6 months: none -Patient denies consistent use of maintenance inhaler -Frequency of rescue inhaler use: not often -Counseled on When to use rescue inhaler -Recommended to continue current medication  Hypothyroidism (Goal: TSH 0.35-4.5) -Controlled -Current treatment  . Levothyroxine 112 mcg daily  -Medications previously tried: none  -Recommended to continue current medication  GERD (Goal: minimize symptoms) -Controlled -Current treatment  . Pantoprazole 40 mg daily  -Medications previously tried: none  -Recommended to continue current medication Patient denies dysphagia, heartburn or nausea. Expresses understanding to avoid triggers such as citrus juices, large meals and lying down after eating.  Chronic pain (Goal: minimize symptoms) -Controlled -Current treatment   APAP 500 mg every 6 hours as needed  Voltaren 1% gel   Emla Cream   Oxycodone-APAP 5-325 mg every 8 hours as needed -Medications previously tried: n/a -Recommended to continue current medication  Chronic constipation(Goal: regular bowel movements) -Controlled -Current treatment   Doxepin 10mg /mL 3 mg QHS   Loperamide 2 mg  -Medications previously tried: dicyclomine (ineffective) -Recommended to continue current medication   Health Maintenance -Vaccine gaps:  shingles, COVID -Current therapy:   Nitroglycerin as needed  Potassium 20 mEq daily   Prochlorperazine 10 mg as needed  Ferrous Sulfate 65 mg 1 tablet nightly -Educated on Cost vs benefit of each product must be carefully weighed by individual consumer -Patient is satisfied with current therapy and denies issues -Recommended to continue current medication  Patient Goals/Self-Care Activities . Patient will:  - take medications as prescribed check blood pressure weekly, document, and provide at future appointments target a minimum of 150 minutes of moderate intensity exercise weekly  Follow Up Plan: Telephone follow up appointment with care management team member scheduled for: : 6 months      Patient verbalizes understanding of instructions provided today and agrees to view in [place].  The pharmacy team will reach out to the patient again over the next 60 days.   [name], RPH  How to Take Your Blood Pressure Blood pressure measures how strongly your blood is pressing against the walls of your arteries. Arteries are blood vessels that carry blood from your heart throughout your body. You can take your blood pressure at home with a machine. You may need to check your blood pressure at home:  To check if you have high blood pressure (hypertension).  To check your blood pressure over time.  To make sure your blood pressure medicine is working. Supplies needed:  Blood pressure machine, or monitor.  Dining room chair to sit in.  Table or desk.  Small notebook.  Pencil or pen. How to prepare Avoid these things for 30 minutes before checking your blood pressure:  Having drinks with caffeine in them, such as coffee or tea.  Drinking alcohol.  Eating.  Smoking.  Exercising. Do these things five minutes before checking your blood pressure:  Go to the bathroom and pee (urinate).  Sit in a dining chair. Do not sit in a soft couch or an armchair.  Be  quiet. Do not talk. How to take your blood pressure Follow the instructions that came  with your machine. If you have a digital blood pressure monitor, these may be the instructions: 1. Sit up straight. 2. Place your feet on the floor. Do not cross your ankles or legs. 3. Rest your left arm at the level of your heart. You may rest it on a table, desk, or chair. 4. Pull up your shirt sleeve. 5. Wrap the blood pressure cuff around the upper part of your left arm. The cuff should be 1 inch (2.5 cm) above your elbow. It is best to wrap the cuff around bare skin. 6. Fit the cuff snugly around your arm. You should be able to place only one finger between the cuff and your arm. 7. Place the cord so that it rests in the bend of your elbow. 8. Press the power button. 9. Sit quietly while the cuff fills with air and loses air. 10. Write down the numbers on the screen. 11. Wait 2-3 minutes and then repeat steps 1-10.   What do the numbers mean? Two numbers make up your blood pressure. The first number is called systolic pressure. The second is called diastolic pressure. An example of a blood pressure reading is "120 over 80" (or 120/80). If you are an adult and do not have a medical condition, use this guide to find out if your blood pressure is normal: Normal  First number: below 120.  Second number: below 80. Elevated  First number: 120-129.  Second number: below 80. Hypertension stage 1  First number: 130-139.  Second number: 80-89. Hypertension stage 2  First number: 140 or above.  Second number: 72 or above. Your blood pressure is above normal even if only the top or bottom number is above normal. Follow these instructions at home:  Check your blood pressure as often as your doctor tells you to.  Check your blood pressure at the same time every day.  Take your monitor to your next doctor's appointment. Your doctor will: ? Make sure you are using it correctly. ? Make sure it is  working right.  Make sure you understand what your blood pressure numbers should be.  Tell your doctor if your medicine is causing side effects.  Keep all follow-up visits as told by your doctor. This is important. General tips:  You will need a blood pressure machine, or monitor. Your doctor can suggest a monitor. You can buy one at a drugstore or online. When choosing one: ? Choose one with an arm cuff. ? Choose one that wraps around your upper arm. Only one finger should fit between your arm and the cuff. ? Do not choose one that measures your blood pressure from your wrist or finger. Where to find more information American Heart Association: www.heart.org Contact a doctor if:  Your blood pressure keeps being high. Get help right away if:  Your first blood pressure number is higher than 180.  Your second blood pressure number is higher than 120. Summary  Check your blood pressure at the same time every day.  Avoid caffeine, alcohol, smoking, and exercise for 30 minutes before checking your blood pressure.  Make sure you understand what your blood pressure numbers should be. This information is not intended to replace advice given to you by your health care provider. Make sure you discuss any questions you have with your health care provider. Document Revised: 09/11/2019 Document Reviewed: 09/11/2019 Elsevier Patient Education  2021 [employer].

## 2021-01-20 ENCOUNTER — Other Ambulatory Visit: Payer: Self-pay

## 2021-02-01 ENCOUNTER — Telehealth (INDEPENDENT_AMBULATORY_CARE_PROVIDER_SITE_OTHER): Payer: Medicare Other | Admitting: Family Medicine

## 2021-02-01 ENCOUNTER — Encounter: Payer: Self-pay | Admitting: Family Medicine

## 2021-02-01 VITALS — HR 65 | Temp 97.9°F

## 2021-02-01 DIAGNOSIS — C3491 Malignant neoplasm of unspecified part of right bronchus or lung: Secondary | ICD-10-CM

## 2021-02-01 DIAGNOSIS — Z20822 Contact with and (suspected) exposure to covid-19: Secondary | ICD-10-CM

## 2021-02-01 DIAGNOSIS — R197 Diarrhea, unspecified: Secondary | ICD-10-CM

## 2021-02-01 DIAGNOSIS — R112 Nausea with vomiting, unspecified: Secondary | ICD-10-CM | POA: Diagnosis not present

## 2021-02-01 NOTE — Progress Notes (Signed)
Virtual Visit via Video Note  I connected with [name] on 02/01/21 at  2:30 PM EDT by a video enabled telemedicine application 2/2 [id number] pandemic and verified that I am speaking with the correct person using two identifiers.  Location patient: home Location provider:work or home office Persons participating in the virtual visit: patient, provider, pt's daughter  I discussed the limitations of evaluation and management by telemedicine and the availability of in person appointments. The patient expressed understanding and agreed to proceed.   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 fib, cataract who is followed by [name], MD and seen today for acute concern.  Patient with emesis and loose stools starting at 2:30 am this morning.  Endorses several more stools and episodes of emesis since then.  Pt tried imodium which has not help.  Pt ate a ham and cheese sandwich and potato salad last night for dinner.  Sick contacts include pt's daughter who tested positive for COVID on last Thursday. Pt also with rhinorrhea but attributed it to allergies. Pt denies sore throat, fever (Temp 97.41F), HA, cough, loss of taste or smell.  ROS: See pertinent positives and negatives per HPI.  Past Medical History:  Diagnosis Date  . CAD (coronary artery disease), native coronary artery    PTCA of distal right coronary artery 1997 Cypher stents to circumflex 2005 Cardiac cath in 2011 with patent stent to circumflex and moderate disease elsewhere treated medically   . Cancer ([place])   . Cardiac pacemaker in situ 12/24/2015   Original implant reportedly in 1991 for tachybradycardia syndrome, generator change in 1999, 2004 and evidently again in 2016 in [place]   . COPD (chronic obstructive pulmonary disease) ([place])   . Diabetes mellitus without  complication ([place])   . Hypertension   . Hypothyroidism 03/23/2010  . Pacemaker   . Paroxysmal atrial fibrillation ([place]) 03/23/2010   CHA2DS2VASC score 5     Past Surgical History:  Procedure Laterality Date  . BIOPSY  01/08/2020   Procedure: BIOPSY;  Surgeon: Garner Nash, DO;  Location: [facility];  Service: Pulmonary;;  . BRONCHIAL BRUSHINGS  01/08/2020   Procedure: BRONCHIAL BRUSHINGS;  Surgeon: Garner Nash, DO;  Location: [facility];  Service: Pulmonary;;  . BRONCHIAL WASHINGS  01/08/2020   Procedure: BRONCHIAL WASHINGS;  Surgeon: Garner Nash, DO;  Location: [facility];  Service: Pulmonary;;  . CARDIAC CATHETERIZATION N/A 12/26/2015   Procedure: Left Heart Cath and Coronary Angiography;  Surgeon: Peter M [name], MD;  Location: [facility];  Service: Cardiovascular;  Laterality: N/A;  . CARDIOVERSION  05/2015  . CHOLECYSTECTOMY  2012  . CORONARY ANGIOPLASTY WITH STENT PLACEMENT  1990  . ENDOBRONCHIAL ULTRASOUND  01/08/2020   Procedure: ENDOBRONCHIAL ULTRASOUND;  Surgeon: Garner Nash, DO;  Location: [facility];  Service: Pulmonary;;  . FINE NEEDLE ASPIRATION BIOPSY  01/08/2020   Procedure: FINE NEEDLE ASPIRATION BIOPSY;  Surgeon: Garner Nash, DO;  Location: [facility];  Service: Pulmonary;;  . HERNIA REPAIR    . IR IMAGING GUIDED PORT INSERTION  02/18/2020  . PACEMAKER GENERATOR CHANGE  [id number], 2016  . PACEMAKER INSERTION  1991  . VIDEO BRONCHOSCOPY WITH ENDOBRONCHIAL ULTRASOUND N/A 01/08/2020   Procedure: VIDEO BRONCHOSCOPY;  Surgeon: Garner Nash, DO;  Location: [facility];  Service: Pulmonary;  Laterality:  N/A;    Family History  Problem Relation Age of Onset  . Heart disease Father   . Heart disease Mother   . Heart attack Mother   . Cirrhosis Brother   . Colon cancer Neg Hx   . Stomach cancer Neg Hx      Current Outpatient Medications:  .  acetaminophen (TYLENOL) 500 MG tablet, Take 500 mg by mouth every 6 (six) hours as needed for  headache., Disp: , Rfl:  .  albuterol (VENTOLIN HFA) 108 (90 Base) MCG/ACT inhaler, TAKE 2 PUFFS BY MOUTH EVERY 6 HOURS AS NEEDED FOR WHEEZE OR SHORTNESS OF BREATH, Disp: 1 each, Rfl: 1 .  atorvastatin (LIPITOR) 10 MG tablet, TAKE 1 TABLET BY MOUTH EVERY DAY, Disp: 90 tablet, Rfl: 0 .  carvedilol (COREG) 12.5 MG tablet, TAKE 1 TABLET (12.5 MG TOTAL) BY MOUTH 2 (TWO) TIMES DAILY WITH A MEAL., Disp: 60 tablet, Rfl: 2 .  diclofenac sodium (VOLTAREN) 1 % GEL, Apply 4 g topically 4 (four) times daily., Disp: 4 Tube, Rfl: 3 .  ELIQUIS 2.5 MG TABS tablet, TAKE 1 TABLET BY MOUTH TWICE A DAY, Disp: 60 tablet, Rfl: 5 .  fenofibrate (TRICOR) 48 MG tablet, TAKE 1 TABLET BY MOUTH EVERY DAY, Disp: 90 tablet, Rfl: 3 .  glucose blood (ONETOUCH VERIO) test strip, Use to test 3-4 times daily., Disp: 300 strip, Rfl: 4 .  KLOR-CON M20 20 MEQ tablet, TAKE 1 TABLET BY MOUTH TWICE A DAY FOR 2 DAYS THEN TAKE 1 TABLET BY MOUTH EVERY DAY, Disp: 90 tablet, Rfl: 1 .  levothyroxine (SYNTHROID) 112 MCG tablet, Take 1 tablet (112 mcg total) by mouth daily before breakfast., Disp: 90 tablet, Rfl: 2 .  lidocaine-prilocaine (EMLA) cream, Apply 1 application topically as needed., Disp: 30 g, Rfl: 2 .  Loperamide HCl (IMODIUM PO), Take by mouth as needed., Disp: , Rfl:  .  losartan (COZAAR) 100 MG tablet, Take 1 tablet (100 mg total) by mouth daily., Disp: 90 tablet, Rfl: 2 .  nicotine (NICODERM CQ - DOSED IN MG/24 HOURS) 14 mg/24hr patch, Place 1 patch (14 mg total) onto the skin daily., Disp: 28 patch, Rfl: 0 .  nitroGLYCERIN (NITROSTAT) 0.4 MG SL tablet, Place 1 tablet (0.4 mg total) under the tongue every 5 (five) minutes x 3 doses as needed for chest pain (Max 3 doses within 15 min. Call 911)., Disp: 10 tablet, Rfl: 0 .  oxyCODONE-acetaminophen (PERCOCET/ROXICET) 5-325 MG tablet, Take 1 tablet by mouth every 8 (eight) hours as needed for severe pain., Disp: 20 tablet, Rfl: 0 .  pantoprazole (PROTONIX) 40 MG tablet, TAKE 1 TABLET  BY MOUTH EVERY DAY, Disp: 90 tablet, Rfl: 1 .  prochlorperazine (COMPAZINE) 10 MG tablet, Take 1 tablet (10 mg total) by mouth every 6 (six) hours as needed for nausea or vomiting., Disp: 30 tablet, Rfl: 1  EXAM:  VITALS per patient if applicable:  Temp 02.7O, RR between 12-20 bpm  GENERAL: alert, oriented, appears well and in no acute distress  HEENT: atraumatic, conjunctiva clear, no obvious abnormalities on inspection of external nose and ears  NECK: normal movements of the head and neck  LUNGS: on inspection no signs of respiratory distress, breathing rate appears normal, no obvious gross SOB, gasping or wheezing  CV: no obvious cyanosis  MS: moves all visible extremities without noticeable abnormality  PSYCH/NEURO: pleasant and cooperative, no obvious depression or anxiety, speech and thought processing grossly intact  ASSESSMENT AND PLAN:  Discussed the following assessment and  plan:  Diarrhea, unspecified type  Non-intractable vomiting with nausea, unspecified vomiting type  Small cell lung cancer, right ([place])  Close exposure to COVID-19 virus  Patient is an 86 year old female who is 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   -COVID testing advised.  Given info on area testing locations.  If positive would benefit from antiviral medications or ab infusion. -Discussed supportive care such as taking small sips of fluids to stay hydrated, imodium, bland diet.   -Pt encouraged to take prochlorperazine for nausea as rx'd by Oncology.  -pt advised to inform oncology of symptoms -given strict precautions for continued or worsened symptoms.  F/u prn   I discussed the assessment and treatment plan with the patient. The patient was provided an opportunity to ask questions and all were answered. The patient agreed with the  plan and demonstrated an understanding of the instructions.   The patient was advised to call back or seek an in-person evaluation if the symptoms worsen or if the condition fails to improve as anticipated.  Billie Ruddy, MD

## 2021-02-08 ENCOUNTER — Ambulatory Visit (INDEPENDENT_AMBULATORY_CARE_PROVIDER_SITE_OTHER): Payer: Medicare Other

## 2021-02-08 DIAGNOSIS — I495 Sick sinus syndrome: Secondary | ICD-10-CM

## 2021-02-08 LAB — CUP PACEART REMOTE DEVICE CHECK
Battery Remaining Longevity: 18 mo
Battery Remaining Percentage: 35 %
Brady Statistic RA Percent Paced: 7 %
Brady Statistic RV Percent Paced: 36 %
Date Time Interrogation Session: 20220511021000
Implantable Lead Implant Date: 19990610
Implantable Lead Implant Date: 20160713
Implantable Lead Location: 753859
Implantable Lead Location: 753860
Implantable Lead Model: 4136
Implantable Lead Serial Number: 29813363
Implantable Pulse Generator Implant Date: 20150130
Lead Channel Impedance Value: 389 Ohm
Lead Channel Impedance Value: 558 Ohm
Lead Channel Pacing Threshold Amplitude: 0.9 V
Lead Channel Pacing Threshold Amplitude: 1.1 V
Lead Channel Pacing Threshold Pulse Width: 0.4 ms
Lead Channel Pacing Threshold Pulse Width: 0.5 ms
Lead Channel Setting Pacing Amplitude: 1.3 V
Lead Channel Setting Pacing Amplitude: 2 V
Lead Channel Setting Pacing Pulse Width: 0.4 ms
Lead Channel Setting Sensing Sensitivity: 2 mV
Pulse Gen Serial Number: 393477

## 2021-02-16 ENCOUNTER — Other Ambulatory Visit: Payer: Self-pay

## 2021-02-16 ENCOUNTER — Inpatient Hospital Stay (HOSPITAL_BASED_OUTPATIENT_CLINIC_OR_DEPARTMENT_OTHER): Payer: Medicare Other | Admitting: Internal Medicine

## 2021-02-16 ENCOUNTER — Inpatient Hospital Stay: Payer: Medicare Other | Attending: Internal Medicine

## 2021-02-16 ENCOUNTER — Inpatient Hospital Stay: Payer: Medicare Other

## 2021-02-16 ENCOUNTER — Encounter: Payer: Self-pay | Admitting: Internal Medicine

## 2021-02-16 ENCOUNTER — Other Ambulatory Visit: Payer: Medicare Other

## 2021-02-16 VITALS — BP 121/51 | HR 63 | Temp 97.0°F | Resp 19 | Wt 107.2 lb

## 2021-02-16 DIAGNOSIS — C3411 Malignant neoplasm of upper lobe, right bronchus or lung: Secondary | ICD-10-CM | POA: Diagnosis not present

## 2021-02-16 DIAGNOSIS — Z95828 Presence of other vascular implants and grafts: Secondary | ICD-10-CM

## 2021-02-16 DIAGNOSIS — Z5112 Encounter for antineoplastic immunotherapy: Secondary | ICD-10-CM | POA: Insufficient documentation

## 2021-02-16 DIAGNOSIS — C7951 Secondary malignant neoplasm of bone: Secondary | ICD-10-CM | POA: Insufficient documentation

## 2021-02-16 DIAGNOSIS — C3491 Malignant neoplasm of unspecified part of right bronchus or lung: Secondary | ICD-10-CM

## 2021-02-16 DIAGNOSIS — Z79899 Other long term (current) drug therapy: Secondary | ICD-10-CM | POA: Diagnosis not present

## 2021-02-16 DIAGNOSIS — C3490 Malignant neoplasm of unspecified part of unspecified bronchus or lung: Secondary | ICD-10-CM

## 2021-02-16 LAB — CBC WITH DIFFERENTIAL (CANCER CENTER ONLY)
Abs Immature Granulocytes: 0.01 10*3/uL (ref 0.00–0.07)
Basophils Absolute: 0 10*3/uL (ref 0.0–0.1)
Basophils Relative: 1 %
Eosinophils Absolute: 0.1 10*3/uL (ref 0.0–0.5)
Eosinophils Relative: 2 %
HCT: 34.3 % — ABNORMAL LOW (ref 36.0–46.0)
Hemoglobin: 10.8 g/dL — ABNORMAL LOW (ref 12.0–15.0)
Immature Granulocytes: 0 %
Lymphocytes Relative: 34 %
Lymphs Abs: 1.9 10*3/uL (ref 0.7–4.0)
MCH: 29.8 pg (ref 26.0–34.0)
MCHC: 31.5 g/dL (ref 30.0–36.0)
MCV: 94.5 fL (ref 80.0–100.0)
Monocytes Absolute: 0.7 10*3/uL (ref 0.1–1.0)
Monocytes Relative: 13 %
Neutro Abs: 2.7 10*3/uL (ref 1.7–7.7)
Neutrophils Relative %: 50 %
Platelet Count: 140 10*3/uL — ABNORMAL LOW (ref 150–400)
RBC: 3.63 MIL/uL — ABNORMAL LOW (ref 3.87–5.11)
RDW: 13.9 % (ref 11.5–15.5)
WBC Count: 5.5 10*3/uL (ref 4.0–10.5)
nRBC: 0 % (ref 0.0–0.2)

## 2021-02-16 LAB — CMP (CANCER CENTER ONLY)
ALT: 21 U/L (ref 0–44)
AST: 29 U/L (ref 15–41)
Albumin: 3.7 g/dL (ref 3.5–5.0)
Alkaline Phosphatase: 68 U/L (ref 38–126)
Anion gap: 7 (ref 5–15)
BUN: 26 mg/dL — ABNORMAL HIGH (ref 8–23)
CO2: 26 mmol/L (ref 22–32)
Calcium: 9.4 mg/dL (ref 8.9–10.3)
Chloride: 110 mmol/L (ref 98–111)
Creatinine: 0.9 mg/dL (ref 0.44–1.00)
GFR, Estimated: >60 mL/min (ref >60)
Glucose, Bld: 90 mg/dL (ref 70–99)
Potassium: 4.6 mmol/L (ref 3.5–5.1)
Sodium: 143 mmol/L (ref 135–145)
Total Bilirubin: 0.4 mg/dL (ref 0.3–1.2)
Total Protein: 6.5 g/dL (ref 6.5–8.1)

## 2021-02-16 LAB — TSH: TSH: 1.56 u[IU]/mL (ref 0.308–3.960)

## 2021-02-16 MED ORDER — SODIUM CHLORIDE 0.9 % IV SOLN
1500.0000 mg | Freq: Once | INTRAVENOUS | Status: AC
  Administered 2021-02-16: 1500 mg via INTRAVENOUS
  Filled 2021-02-16: qty 30

## 2021-02-16 MED ORDER — SODIUM CHLORIDE 0.9 % IV SOLN
Freq: Once | INTRAVENOUS | Status: AC
  Filled 2021-02-16: qty 250

## 2021-02-16 MED ORDER — HEPARIN SOD (PORK) LOCK FLUSH 100 UNIT/ML IV SOLN
500.0000 [IU] | Freq: Once | INTRAVENOUS | Status: AC | PRN
  Administered 2021-02-16: 500 [IU]
  Filled 2021-02-16: qty 5

## 2021-02-16 MED ORDER — SODIUM CHLORIDE 0.9% FLUSH
10.0000 mL | INTRAVENOUS | Status: DC | PRN
  Administered 2021-02-16: 10 mL
  Filled 2021-02-16: qty 10

## 2021-02-16 MED ORDER — SODIUM CHLORIDE 0.9% FLUSH
10.0000 mL | Freq: Once | INTRAVENOUS | Status: AC
  Administered 2021-02-16: 10 mL
  Filled 2021-02-16: qty 10

## 2021-02-16 NOTE — Patient Instructions (Signed)
[facility]  Discharge Instructions: Thank you for choosing [facility] to provide your oncology and hematology care.   If you have a lab appointment with the Cancer Center, please go directly to the Cancer Center and check in at the registration area.   Wear comfortable clothing and clothing appropriate for easy access to any Portacath or PICC line.   We strive to give you quality time with your provider. You may need to reschedule your appointment if you arrive late (15 or more minutes).  Arriving late affects you and other patients whose appointments are after yours.  Also, if you miss three or more appointments without notifying the office, you may be dismissed from the clinic at the provider's discretion.      For prescription refill requests, have your pharmacy contact our office and allow 72 hours for refills to be completed.    Today you received the following chemotherapy and/or immunotherapy agents Imfinzi      To help prevent nausea and vomiting after your treatment, we encourage you to take your nausea medication as directed.  BELOW ARE SYMPTOMS THAT SHOULD BE REPORTED IMMEDIATELY: *FEVER GREATER THAN 100.4 F (38 C) OR HIGHER *CHILLS OR SWEATING *NAUSEA AND VOMITING THAT IS NOT CONTROLLED WITH YOUR NAUSEA MEDICATION *UNUSUAL SHORTNESS OF BREATH *UNUSUAL BRUISING OR BLEEDING *URINARY PROBLEMS (pain or burning when urinating, or frequent urination) *BOWEL PROBLEMS (unusual diarrhea, constipation, pain near the anus) TENDERNESS IN MOUTH AND THROAT WITH OR WITHOUT PRESENCE OF ULCERS (sore throat, sores in mouth, or a toothache) UNUSUAL RASH, SWELLING OR PAIN  UNUSUAL VAGINAL DISCHARGE OR ITCHING   Items with * indicate a potential emergency and should be followed up as soon as possible or go to the Emergency Department if any problems should occur.  Please show the CHEMOTHERAPY ALERT CARD or IMMUNOTHERAPY ALERT CARD at check-in to the  Emergency Department and triage nurse.  Should you have questions after your visit or need to cancel or reschedule your appointment, please contact [facility]  Dept: [phone]  and follow the prompts.  Office hours are 8:00 a.m. to 4:30 p.m. Monday - Friday. Please note that voicemails left after 4:00 p.m. may not be returned until the following business day.  We are closed weekends and major holidays. You have access to a nurse at all times for urgent questions. Please call the main number to the clinic Dept: [phone] and follow the prompts.   For any non-urgent questions, you may also contact your provider using MyChart. We now offer e-Visits for anyone 18 and older to request care online for non-urgent symptoms. For details visit [email or web address].   Also download the MyChart app! Go to the app store, search "MyChart", open the app, select , and log in with your MyChart username and password.  Due to Covid, a mask is required upon entering the hospital/clinic. If you do not have a mask, one will be given to you upon arrival. For doctor visits, patients may have 1 support person aged 18 or older with them. For treatment visits, patients cannot have anyone with them due to current Covid guidelines and our immunocompromised population.   

## 2021-02-16 NOTE — Progress Notes (Signed)
[facility] [phone]   [phone]  OFFICE PROGRESS NOTE  [name], Robin G, MD [address] [place] [address]  DIAGNOSIS: Extensive stage (T2 a, N2, M1 C) small cell lung cancer presented with central right upper lobe lung mass in addition to left lower lobe pulmonary metastasis and bilateral hilar, subcarinal and bilateral paratracheal and left prevascular lymphadenopathy in addition to bone metastasis in the left iliac wing diagnosed in April 2021.  PRIOR THERAPY: None  CURRENT THERAPY: Systemic chemotherapy with carboplatin for AUC of 5 on day 1, etoposide 100 mg/M2 on days 1, 2 and 3 with Neulasta support in addition to Imfinzi 1500 mg IV every 3 weeks during chemotherapy followed by maintenance Imfinzi 1500 mg IV every 4 weeks after cycle #4.  Status post 14 cycles.  INTERVAL HISTORY: [name] 86 y.o. female returns to the clinic today for follow-up visit accompanied by her daughter.  The patient is feeling fine today with no concerning complaints except for headache started few days ago.  She denied having any current chest pain, shortness of breath, cough or hemoptysis.  She denied having any fever or chills.  She has no nausea, vomiting, diarrhea or constipation.  She has no fever or chills.  She continues to tolerate her treatment with maintenance Imfinzi fairly well.  The patient is here today for evaluation before starting cycle #15 of her treatment.   MEDICAL HISTORY: Past Medical History:  Diagnosis Date  . CAD (coronary artery disease), native coronary artery    PTCA of distal right coronary artery 1997 Cypher stents to circumflex 2005 Cardiac cath in 2011 with patent stent to circumflex and moderate disease elsewhere treated medically   . Cancer ([facility])   . Cardiac pacemaker in situ 12/24/2015   Original implant reportedly in 1991 for tachybradycardia syndrome, generator change in 1999, 2004 and evidently again in 2016 in  [place]   . COPD (chronic obstructive pulmonary disease) ([facility])   . Diabetes mellitus without complication ([facility])   . Hypertension   . Hypothyroidism 03/23/2010  . Pacemaker   . Paroxysmal atrial fibrillation ([facility]) 03/23/2010   CHA2DS2VASC score 5     ALLERGIES:  is allergic to seasonal ic [cholestatin].  MEDICATIONS:  Current Outpatient Medications  Medication Sig Dispense Refill  . acetaminophen (TYLENOL) 500 MG tablet Take 500 mg by mouth every 6 (six) hours as needed for headache.    . albuterol (VENTOLIN HFA) 108 (90 Base) MCG/ACT inhaler TAKE 2 PUFFS BY MOUTH EVERY 6 HOURS AS NEEDED FOR WHEEZE OR SHORTNESS OF BREATH 1 each 1  . atorvastatin (LIPITOR) 10 MG tablet TAKE 1 TABLET BY MOUTH EVERY DAY 90 tablet 0  . carvedilol (COREG) 12.5 MG tablet TAKE 1 TABLET (12.5 MG TOTAL) BY MOUTH 2 (TWO) TIMES DAILY WITH A MEAL. 60 tablet 2  . diclofenac sodium (VOLTAREN) 1 % GEL Apply 4 g topically 4 (four) times daily. 4 Tube 3  . ELIQUIS 2.5 MG TABS tablet TAKE 1 TABLET BY MOUTH TWICE A DAY 60 tablet 5  . fenofibrate (TRICOR) 48 MG tablet TAKE 1 TABLET BY MOUTH EVERY DAY 90 tablet 3  . glucose blood (ONETOUCH VERIO) test strip Use to test 3-4 times daily. 300 strip 4  . KLOR-CON M20 20 MEQ tablet TAKE 1 TABLET BY MOUTH TWICE A DAY FOR 2 DAYS THEN TAKE 1 TABLET BY MOUTH EVERY DAY 90 tablet 1  . levothyroxine (SYNTHROID) 112 MCG tablet Take 1  tablet (112 mcg total) by mouth daily before breakfast. 90 tablet 2  . lidocaine-prilocaine (EMLA) cream Apply 1 application topically as needed. 30 g 2  . Loperamide HCl (IMODIUM PO) Take by mouth as needed.    Marland Kitchen losartan (COZAAR) 100 MG tablet Take 1 tablet (100 mg total) by mouth daily. 90 tablet 2  . nicotine (NICODERM CQ - DOSED IN MG/24 HOURS) 14 mg/24hr patch Place 1 patch (14 mg total) onto the skin daily. 28 patch 0  . nitroGLYCERIN (NITROSTAT) 0.4 MG SL tablet Place 1 tablet (0.4 mg total) under the tongue every 5 (five) minutes x 3 doses as needed  for chest pain (Max 3 doses within 15 min. Call 911). 10 tablet 0  . oxyCODONE-acetaminophen (PERCOCET/ROXICET) 5-325 MG tablet Take 1 tablet by mouth every 8 (eight) hours as needed for severe pain. 20 tablet 0  . pantoprazole (PROTONIX) 40 MG tablet TAKE 1 TABLET BY MOUTH EVERY DAY 90 tablet 1  . prochlorperazine (COMPAZINE) 10 MG tablet Take 1 tablet (10 mg total) by mouth every 6 (six) hours as needed for nausea or vomiting. 30 tablet 1   No current facility-administered medications for this visit.    SURGICAL HISTORY:  Past Surgical History:  Procedure Laterality Date  . BIOPSY  01/08/2020   Procedure: BIOPSY;  Surgeon: Garner Nash, DO;  Location: [facility];  Service: Pulmonary;;  . BRONCHIAL BRUSHINGS  01/08/2020   Procedure: BRONCHIAL BRUSHINGS;  Surgeon: Garner Nash, DO;  Location: [facility];  Service: Pulmonary;;  . BRONCHIAL WASHINGS  01/08/2020   Procedure: BRONCHIAL WASHINGS;  Surgeon: Garner Nash, DO;  Location: [facility];  Service: Pulmonary;;  . CARDIAC CATHETERIZATION N/A 12/26/2015   Procedure: Left Heart Cath and Coronary Angiography;  Surgeon: Peter M [name], MD;  Location: [facility];  Service: Cardiovascular;  Laterality: N/A;  . CARDIOVERSION  05/2015  . CHOLECYSTECTOMY  2012  . CORONARY ANGIOPLASTY WITH STENT PLACEMENT  1990  . ENDOBRONCHIAL ULTRASOUND  01/08/2020   Procedure: ENDOBRONCHIAL ULTRASOUND;  Surgeon: Garner Nash, DO;  Location: [facility];  Service: Pulmonary;;  . FINE NEEDLE ASPIRATION BIOPSY  01/08/2020   Procedure: FINE NEEDLE ASPIRATION BIOPSY;  Surgeon: Garner Nash, DO;  Location: [facility];  Service: Pulmonary;;  . HERNIA REPAIR    . IR IMAGING GUIDED PORT INSERTION  02/18/2020  . PACEMAKER GENERATOR CHANGE  [id number], 2016  . PACEMAKER INSERTION  1991  . VIDEO BRONCHOSCOPY WITH ENDOBRONCHIAL ULTRASOUND N/A 01/08/2020   Procedure: VIDEO BRONCHOSCOPY;  Surgeon: Garner Nash, DO;  Location: [facility];   Service: Pulmonary;  Laterality: N/A;    REVIEW OF SYSTEMS:  A comprehensive review of systems was negative except for: Constitutional: positive for fatigue Neurological: positive for headaches   PHYSICAL EXAMINATION: General appearance: alert, cooperative, appears stated age and no distress Head: Normocephalic, without obvious abnormality, atraumatic Neck: no adenopathy, no JVD, supple, symmetrical, trachea midline and thyroid not enlarged, symmetric, no tenderness/mass/nodules Lymph nodes: Cervical, supraclavicular, and axillary nodes normal. Resp: clear to auscultation bilaterally Back: symmetric, no curvature. ROM normal. No CVA tenderness. Cardio: regular rate and rhythm, S1, S2 normal, no murmur, click, rub or gallop GI: soft, non-tender; bowel sounds normal; no masses,  no organomegaly Extremities: extremities normal, atraumatic, no cyanosis or edema  ECOG PERFORMANCE STATUS: 1 - Symptomatic but completely ambulatory  Blood pressure (!) 121/51, pulse 63, temperature (!) 97 F (36.1 C), temperature source Tympanic, resp. rate 19, weight 107 lb 3.2 oz (48.6  kg), SpO2 100 %.  LABORATORY DATA: Lab Results  Component Value Date   WBC 5.5 02/16/2021   HGB 10.8 (L) 02/16/2021   HCT 34.3 (L) 02/16/2021   MCV 94.5 02/16/2021   PLT 140 (L) 02/16/2021      Chemistry      Component Value Date/Time   NA 145 01/19/2021 1000   NA 143 03/17/2020 0915   K 4.5 01/19/2021 1000   CL 110 01/19/2021 1000   CO2 25 01/19/2021 1000   BUN 19 01/19/2021 1000   BUN 6 (L) 03/17/2020 0915   CREATININE 0.77 01/19/2021 1000      Component Value Date/Time   CALCIUM 9.0 01/19/2021 1000   ALKPHOS 87 01/19/2021 1000   AST 20 01/19/2021 1000   ALT 18 01/19/2021 1000   BILITOT 0.3 01/19/2021 1000       RADIOGRAPHIC STUDIES: CUP PACEART REMOTE DEVICE CHECK  Result Date: 02/08/2021 Scheduled remote reviewed. Normal device function.  Persistent AF. 2 NS episode both 01/02/21 @ 160's-170's bpm  lasting 6-8 sec.  EGM shows AF with RVR. Next remote 91 days. LH   ASSESSMENT AND PLAN: This is a very pleasant 86 years old African-American female recently diagnosed with extensive stage small cell lung cancer presented with central right upper lobe lung mass in addition to left lower lobe lung nodule and mediastinal lymphadenopathy and metastatic bone lesion in the left iliac wing diagnosed in April 2021. The patient started her treatment today with carboplatin for AUC of 5 on day 1, etoposide 100 mg/M2 on days 1, 2 and 3 with Neulasta support in addition to Imfinzi 1500 mg every 3 weeks during the chemotherapy course followed by 1500 mg every 4 weeks as maintenance. Status post 14 cycles.   The patient continues to tolerate her treatment fairly well with no concerning adverse effects. I recommended for her to proceed with cycle #15 today as planned. I will see her back for follow-up visit in 4 weeks for evaluation with repeat CT scan of the chest, abdomen pelvis for restaging of her disease. For the persistent headache, I will arrange for the patient to have repeat CT scan of the head with and without contrast to rule out brain metastasis. The patient cannot have MRI because of the pacemaker. She was advised to call immediately if she has any other concerning symptoms in the interval. The patient voices understanding of current disease status and treatment options and is in agreement with the current care plan.  All questions were answered. The patient knows to call the clinic with any problems, questions or concerns. We can certainly see the patient much sooner if necessary.  Disclaimer: This note was dictated with voice recognition software. Similar sounding words can inadvertently be transcribed and may not be corrected upon review.

## 2021-02-16 NOTE — Patient Instructions (Signed)

## 2021-02-17 ENCOUNTER — Other Ambulatory Visit: Payer: Self-pay | Admitting: Family Medicine

## 2021-02-17 DIAGNOSIS — F17200 Nicotine dependence, unspecified, uncomplicated: Secondary | ICD-10-CM

## 2021-02-17 NOTE — Telephone Encounter (Signed)
Last OV: 01/17/2021 Last Refill: 01/19/2021  Disp: 28   R: 0  Future OV: None scheduled

## 2021-03-02 NOTE — Progress Notes (Signed)
Remote pacemaker transmission.   

## 2021-03-10 ENCOUNTER — Other Ambulatory Visit: Payer: Self-pay | Admitting: Family Medicine

## 2021-03-10 DIAGNOSIS — E876 Hypokalemia: Secondary | ICD-10-CM

## 2021-03-14 ENCOUNTER — Other Ambulatory Visit: Payer: Self-pay

## 2021-03-14 ENCOUNTER — Telehealth: Payer: Self-pay | Admitting: Physician Assistant

## 2021-03-14 ENCOUNTER — Encounter (HOSPITAL_COMMUNITY): Payer: Self-pay

## 2021-03-14 ENCOUNTER — Other Ambulatory Visit: Payer: Self-pay | Admitting: Physician Assistant

## 2021-03-14 ENCOUNTER — Ambulatory Visit (HOSPITAL_COMMUNITY)
Admission: RE | Admit: 2021-03-14 | Discharge: 2021-03-14 | Disposition: A | Discharge status: Home / Self Care | Payer: Medicare Other | Source: Ambulatory Visit | Attending: Internal Medicine | Admitting: Internal Medicine

## 2021-03-14 DIAGNOSIS — I6782 Cerebral ischemia: Secondary | ICD-10-CM | POA: Diagnosis not present

## 2021-03-14 DIAGNOSIS — N281 Cyst of kidney, acquired: Secondary | ICD-10-CM | POA: Diagnosis not present

## 2021-03-14 DIAGNOSIS — I2510 Atherosclerotic heart disease of native coronary artery without angina pectoris: Secondary | ICD-10-CM | POA: Insufficient documentation

## 2021-03-14 DIAGNOSIS — M5134 Other intervertebral disc degeneration, thoracic region: Secondary | ICD-10-CM | POA: Diagnosis not present

## 2021-03-14 DIAGNOSIS — J439 Emphysema, unspecified: Secondary | ICD-10-CM | POA: Diagnosis not present

## 2021-03-14 DIAGNOSIS — I700 Atherosclerosis of aorta: Secondary | ICD-10-CM | POA: Diagnosis not present

## 2021-03-14 DIAGNOSIS — J8410 Pulmonary fibrosis, unspecified: Secondary | ICD-10-CM | POA: Diagnosis not present

## 2021-03-14 DIAGNOSIS — C7931 Secondary malignant neoplasm of brain: Secondary | ICD-10-CM

## 2021-03-14 DIAGNOSIS — M40204 Unspecified kyphosis, thoracic region: Secondary | ICD-10-CM | POA: Diagnosis not present

## 2021-03-14 DIAGNOSIS — C3490 Malignant neoplasm of unspecified part of unspecified bronchus or lung: Secondary | ICD-10-CM | POA: Diagnosis not present

## 2021-03-14 DIAGNOSIS — R519 Headache, unspecified: Secondary | ICD-10-CM | POA: Diagnosis not present

## 2021-03-14 MED ORDER — SODIUM CHLORIDE (PF) 0.9 % IJ SOLN
INTRAMUSCULAR | Status: AC
  Filled 2021-03-14: qty 50

## 2021-03-14 MED ORDER — HEPARIN SOD (PORK) LOCK FLUSH 100 UNIT/ML IV SOLN
500.0000 [IU] | Freq: Once | INTRAVENOUS | Status: AC
  Administered 2021-03-14: 500 [IU] via INTRAVENOUS

## 2021-03-14 MED ORDER — DEXAMETHASONE 4 MG PO TABS: ORAL_TABLET | ORAL | 0 refills | Status: DC

## 2021-03-14 MED ORDER — IOHEXOL 300 MG/ML  SOLN
100.0000 mL | Freq: Once | INTRAMUSCULAR | Status: AC | PRN
  Administered 2021-03-14: 75 mL via INTRAVENOUS

## 2021-03-14 MED ORDER — HEPARIN SOD (PORK) LOCK FLUSH 100 UNIT/ML IV SOLN
INTRAVENOUS | Status: AC
  Filled 2021-03-14: qty 5

## 2021-03-14 NOTE — Progress Notes (Signed)
Histology and Location of Primary Cancer:  Extensive stage (T2 a, N2, M1 C) small cell lung   Location(s) of Symptomatic tumor(s):  03/14/2021 CT Head CT w/ & w/o contrast IMPRESSION: --Three intracranial metastatic lesions are identified, new as compared to the head CT of 01/05/2020. These lesions are located within the inferomedial left cerebellum, right midbrain/pons and right occipital lobe. The right occipital and left cerebellar lesions are largest, measuring 2.5 cm in greatest dimension. --There is subtle precontrast hyperdensity associated with the lesions in the right occipital lobe and right  midbrain/pons, which may reflect a small amount of associated hemorrhage. --Mild vasogenic edema surrounds the lesion within the right occipital lobe. --The right occipital and left cerebellar lesions mildly efface the ventricular system. No midline shift or hydrocephalus. --Background mild generalized parenchymal atrophy and chronic small vessel ischemic disease  Past/Anticipated chemotherapy by medical oncology, if any:  Phone note from Cassie Heilingoetter-PA -03/14/2021 I called and spoke to the patient's daughter. I have placed an urgent referral to radiation oncology for the new brain mets. I also started her on Decadron for the edema. We will see her as scheduled on 03/16/21 and can go over further instructions at that time.  Under care of Dr. Curt Bears 02/16/2021 --Systemic chemotherapy with carboplatin for AUC of 5 on day 1, etoposide 100 mg/M2 on days 1, 2 and 3 with Neulasta support in addition to Imfinzi 1500 mg IV every 3 weeks during chemotherapy followed by maintenance Imfinzi 1500 mg IV every 4 weeks after cycle #4.  Status post 14 cycles. --The patient continues to tolerate her treatment fairly well with no concerning adverse effects. --I recommended for her to proceed with cycle #15 today as planned. --I will see her back for follow-up visit in 4 weeks for evaluation with  repeat CT scan of the chest, abdomen pelvis for restaging of her disease. --For the persistent headache, I will arrange for the patient to have repeat CT scan of the head with and without contrast to rule out brain metastasis. --The patient cannot have MRI because of the pacemaker. --She was advised to call immediately if she has any other concerning symptoms in the interval.  Patient's main complaints related to symptomatic tumor(s) are: Persistent headaches prior to starting steroid  Recent neurologic symptoms, if any:  Seizures: Patient denies Headaches: Reports persistent headaches have lessened Nausea: Patient denies Dizziness/ataxia: Reports she feels off balance and often staggers when she stands to ambulate Difficulty with hand coordination: Patient denies Focal numbness/weakness: Reports her fingers will occasionally go numb after prolonged use (such as using a stylis on her I-pad). Also reports weakness to both legs, and pain that shoots down her right leg when she's laying on her side Visual deficits/changes: Reports bilateral cataracts. States her right lower lid has started drooping.  Confusion/Memory deficits: Reports issues with both short-term and long-term memory.   Dose of Decadron, if applicable: 4mg  tablet every 6 hours (started evening on 03/14/21)  Ambulatory status? Walker? Wheelchair?: Presents to clinic in wheelchair. Uses a rollater around the house and in the community  SAFETY ISSUES: Prior radiation? No Pacemaker/ICD? Yes Possible current pregnancy? No--postmenopausal Is the patient on methotrexate? No  Additional Complaints / other details:

## 2021-03-14 NOTE — Telephone Encounter (Signed)
I called and spoke to the patient's daughter. I have placed an urgent referral to radiation oncology for the new brain mets. I also started her on Decadron for the edema. We will see her as scheduled on 03/16/21 and can go over further instructions at that time.

## 2021-03-14 NOTE — Progress Notes (Signed)
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 Brain metastases ([place])  C79.31     2. Metastatic cancer to brain ([place])  C79.31 dexamethasone (DECADRON) 4 MG tablet       HISTORY OF PRESENT ILLNESS::[name] is a 86 y.o. female who  has a history of extensive stage small cell lung cancer diagnosed in April 2021. She is here today for an urgent referral placed by Cassandra Heilingoetter PA-C in regards to new brain metastases.  Her initial lung cancer diagnosis stemmed from a chest CT taken on 12/27/19: results showed bilateral lung masses in the right upper and left lower lobe, with associated hilar and mediastinal adenopathy consistent with neoplasm. The patient underwent endobrachial biopsy on 01/08/20 revealing small cell carcinoma, and fine needle aspiration revealing malignant cells consistent with small cell carcinoma.  CT of the brain taken shortly after her lung cancer diagnosis on 01/05/20 showed no abnormal findings or metastasis to the brain. Pet scan taken on 01/15/20 did however show bone metastasis to the left iliac wing.  The patient received systemic chemotherapy with carboplatin for AUC of 5 on day 1, etoposide 100 mg/M2 on days 1, 2 and 3 with Neulasta support in addition to Imfinzi 1500 mg IV every 3 weeks during chemotherapy followed by maintenance Imfinzi 1500 mg IV every 4 weeks after cycle #4 ; her first infusion was on 02/10/20 (approximately 1 year ago), and her last infusion (to date) was on 02/16/21.  Of note: her last in person follow-up was with Dr. Julien Nordmann on 02/16/21 for evaluation before starting cycle #15 of her treatment. The patient was noted to be  feeling fine during the visit with no concerning complaints other than a headache that started several days prior to the visit. She denied having any other symptoms at that time.   CT of the brain on 03/14/21 demonstrated three intracranial metastatic lesions identified, new as compared to the head CT of 01/05/2020. These lesions are located within the inferomedial left cerebellum, right midbrain/pons and right occipital lobe. The right occipital and left cerebellar lesions are largest, measuring 2.5 cm in greatest dimension.  I have personally reviewed her imaging  There is subtle precontrast hyperdensity associated with the lesions in the right occipital lobe and right midbrain/pons, which may reflect a small amount of associated hemorrhage. Mild vasogenic edema surrounds the lesion within the right occipital lobe.  Today the patient reports that her headaches are improved with Tylenol and recently starting dexamethasone about 24 hours ago.  She has been taking the dexamethasone every 6 hours and her daughter wakes her up for each dose if needed.  Patient acknowledges that she is feeling wobbly when walking.  She presents in a wheelchair today.  She does have some memory issues and her daughter reports that she comes to the patient's medical appointments to help her mother process and remember the information  Recent neurologic symptoms, if any:  Seizures: Patient denies Headaches: Reports persistent headaches have lessened Nausea: Patient denies Dizziness/ataxia: Reports she feels off balance and often staggers when she stands to ambulate Difficulty with hand coordination: Patient denies Focal numbness/weakness: Reports her fingers will occasionally go numb after  prolonged use (such as using a stylis on her I-pad). Also reports weakness to both legs, and pain that shoots down her right leg when she's laying on her side Visual deficits/changes: Reports bilateral cataracts. States her right lower lid  has started drooping.  Confusion/Memory deficits: Reports issues with both short-term and long-term memory.   Dose of Decadron, if applicable: 4mg  tablet every 6 hours (started evening on 03/14/21)  Ambulatory status? Walker? Wheelchair?: Presents to clinic in wheelchair. Uses a rollater around the house and in the community  SAFETY ISSUES: Prior radiation? No Pacemaker/ICD? Yes Possible current pregnancy? No--postmenopausal Is the patient on methotrexate? No   PREVIOUS RADIATION THERAPY: No  PAST MEDICAL HISTORY:  has a past medical history of CAD (coronary artery disease), native coronary artery, Cancer ([place]), Cardiac pacemaker in situ (12/24/2015), COPD (chronic obstructive pulmonary disease) ([place]), Diabetes mellitus without complication ([place]), Hypertension, Hypothyroidism (03/23/2010), Pacemaker, and Paroxysmal atrial fibrillation ([place]) (03/23/2010).    PAST SURGICAL HISTORY: Past Surgical History:  Procedure Laterality Date   BIOPSY  01/08/2020   Procedure: BIOPSY;  Surgeon: Garner Nash, DO;  Location: [facility] [facility];  Service: Pulmonary;;   BRONCHIAL BRUSHINGS  01/08/2020   Procedure: BRONCHIAL BRUSHINGS;  Surgeon: Garner Nash, DO;  Location: [place] [facility];  Service: Pulmonary;;   BRONCHIAL WASHINGS  01/08/2020   Procedure: BRONCHIAL WASHINGS;  Surgeon: Garner Nash, DO;  Location: [facility];  Service: Pulmonary;;   CARDIAC CATHETERIZATION N/A 12/26/2015   Procedure: Left Heart Cath and Coronary Angiography;  Surgeon: Peter M [name], MD;  Location: [facility];  Service: Cardiovascular;  Laterality: N/A;   CARDIOVERSION  05/2015   CHOLECYSTECTOMY  2012   CORONARY ANGIOPLASTY WITH STENT PLACEMENT  1990   ENDOBRONCHIAL ULTRASOUND  01/08/2020   Procedure: ENDOBRONCHIAL ULTRASOUND;  Surgeon: Garner Nash, DO;  Location: [place] [facility];  Service: Pulmonary;;   FINE NEEDLE ASPIRATION BIOPSY  01/08/2020   Procedure: FINE NEEDLE ASPIRATION BIOPSY;  Surgeon: Garner Nash,  DO;  Location: [facility];  Service: Pulmonary;;   HERNIA REPAIR     IR IMAGING GUIDED PORT INSERTION  02/18/2020   PACEMAKER GENERATOR CHANGE  [id number], 2016   PACEMAKER INSERTION  1991   VIDEO BRONCHOSCOPY WITH ENDOBRONCHIAL ULTRASOUND N/A 01/08/2020   Procedure: VIDEO BRONCHOSCOPY;  Surgeon: Garner Nash, DO;  Location: [facility];  Service: Pulmonary;  Laterality: N/A;    FAMILY HISTORY: family history includes Cirrhosis in her brother; Heart attack in her mother; Heart disease in her father and mother.  SOCIAL HISTORY:  reports that she has been smoking cigarettes. She has a 16.50 pack-year smoking history. She has never used smokeless tobacco. She reports that she does not drink alcohol and does not use drugs.  ALLERGIES: Seasonal ic [cholestatin]  MEDICATIONS:  Current Outpatient Medications  Medication Sig Dispense Refill   acetaminophen (TYLENOL) 500 MG tablet Take 500 mg by mouth every 6 (six) hours as needed for headache.     albuterol (VENTOLIN HFA) 108 (90 Base) MCG/ACT inhaler TAKE 2 PUFFS BY MOUTH EVERY 6 HOURS AS NEEDED FOR WHEEZE OR SHORTNESS OF BREATH 1 each 1   atorvastatin (LIPITOR) 10 MG tablet TAKE 1 TABLET BY MOUTH EVERY DAY 90 tablet 0   carvedilol (COREG) 12.5 MG tablet TAKE 1 TABLET (12.5 MG TOTAL) BY MOUTH 2 (TWO) TIMES DAILY WITH A MEAL. 180 tablet 0   dexamethasone (DECADRON) 4 MG tablet Take 1 tablet three times a day with food. 60 tablet 0   diclofenac  sodium (VOLTAREN) 1 % GEL Apply 4 g topically 4 (four) times daily. 4 Tube 3   fenofibrate (TRICOR) 48 MG tablet TAKE 1 TABLET BY MOUTH EVERY DAY 90 tablet 3   glucose blood (ONETOUCH VERIO) test strip Use to test 3-4 times daily. 300 strip 4   KLOR-CON M20 20 MEQ tablet TAKE 1 TABLET BY MOUTH TWICE A DAY FOR 2 DAYS THEN TAKE 1 TABLET BY MOUTH EVERY DAY 90 tablet 1   levothyroxine (SYNTHROID) 112 MCG tablet Take 1 tablet (112 mcg total) by mouth daily before breakfast. 90 tablet 2   lidocaine-prilocaine  (EMLA) cream Apply 1 application topically as needed. 30 g 2   Loperamide HCl (IMODIUM PO) Take by mouth as needed.     losartan (COZAAR) 100 MG tablet Take 1 tablet (100 mg total) by mouth daily. 90 tablet 2   nicotine (NICODERM CQ - DOSED IN MG/24 HOURS) 14 mg/24hr patch PLACE 1 PATCH ONTO THE SKIN DAILY. 28 patch 0   nitroGLYCERIN (NITROSTAT) 0.4 MG SL tablet Place 1 tablet (0.4 mg total) under the tongue every 5 (five) minutes x 3 doses as needed for chest pain (Max 3 doses within 15 min. Call 911). 10 tablet 0   oxyCODONE-acetaminophen (PERCOCET/ROXICET) 5-325 MG tablet Take 1 tablet by mouth every 8 (eight) hours as needed for severe pain. 20 tablet 0   pantoprazole (PROTONIX) 40 MG tablet TAKE 1 TABLET BY MOUTH EVERY DAY 90 tablet 1   prochlorperazine (COMPAZINE) 10 MG tablet Take 1 tablet (10 mg total) by mouth every 6 (six) hours as needed for nausea or vomiting. 30 tablet 1   No current facility-administered medications for this encounter.    REVIEW OF SYSTEMS:  As above.   PHYSICAL EXAM:  height is 5' 1.5" (1.562 m) and weight is 109 lb 3.2 oz (49.5 kg). Her temperature is 97.9 F (36.6 C). Her blood pressure is 112/42 (abnormal) and her pulse is 62. Her respiration is 20 and oxygen saturation is 100%.   General: Alert in no acute distress  - presents in a wheelchair HEENT: Head is normocephalic. Extraocular movements are intact. Oropharynx is clear without thrush. Musculoskeletal: symmetric strength and muscle tone throughout. Neurologic:  No obvious focalities - the visual quadrants were not tested today. Speech is fluent. Coordination is intact per finger-to-nose testing.  Denies any numbness to touch throughout the extremities.  Patient does show some cognitive difficulties in terms of memory and processing information. Psychiatric: Affect is appropriate.  KPS =  50  100 - Normal; no complaints; no evidence of disease. 90   - Able to carry on normal activity; minor signs or  symptoms of disease. 80   - Normal activity with effort; some signs or symptoms of disease. 72   - Cares for self; unable to carry on normal activity or to do active work. 60   - Requires occasional assistance, but is able to care for most of his personal needs. 50   - Requires considerable assistance and frequent medical care. 29   - Disabled; requires special care and assistance. 91   - Severely disabled; hospital admission is indicated although death not imminent. 51   - Very sick; hospital admission necessary; active supportive treatment necessary. 10   - Moribund; fatal processes progressing rapidly. 0     - Dead  Karnofsky DA, Abelmann WH, Craver LS and Burchenal [facility] [phone]) The use of the nitrogen mustards in the palliative treatment of carcinoma: with particular reference to  bronchogenic carcinoma Cancer 1 634-56   LABORATORY DATA:  Lab Results  Component Value Date   WBC 7.6 03/16/2021   HGB 10.1 (L) 03/16/2021   HCT 31.3 (L) 03/16/2021   MCV 92.9 03/16/2021   PLT 128 (L) 03/16/2021   CMP     Component Value Date/Time   NA 142 03/16/2021 0949   NA 143 03/17/2020 0915   K 4.4 03/16/2021 0949   CL 111 03/16/2021 0949   CO2 25 03/16/2021 0949   GLUCOSE 137 (H) 03/16/2021 0949   BUN 20 03/16/2021 0949   BUN 6 (L) 03/17/2020 0915   CREATININE 0.75 03/16/2021 0949   CALCIUM 9.6 03/16/2021 0949   PROT 6.9 03/16/2021 0949   ALBUMIN 3.7 03/16/2021 0949   AST 26 03/16/2021 0949   ALT 29 03/16/2021 0949   ALKPHOS 81 03/16/2021 0949   BILITOT 0.4 03/16/2021 0949   GFRNONAA >60 03/16/2021 0949   GFRAA >60 06/27/2020 0924         RADIOGRAPHY: CT Head W Wo Contrast  Addendum Date: 03/14/2021   ADDENDUM REPORT: 03/14/2021 14:19 ADDENDUM: These results were called by telephone at the time of interpretation on 03/14/2021 at 2:18 pm to provider Dr. Lorenso Courier, who verbally acknowledged these results. Electronically Signed   By: Kellie Simmering DO   On: 03/14/2021 14:19   Result Date:  03/14/2021 CLINICAL DATA:  No new neoplasm of unspecified part of unspecified bronchus or lung. Small cell lung cancer, staging; headache. EXAM: CT HEAD WITHOUT AND WITH CONTRAST TECHNIQUE: Contiguous axial images were obtained from the base of the skull through the vertex without and with intravenous contrast CONTRAST:  57mL OMNIPAQUE IOHEXOL 300 MG/ML  SOLN COMPARISON:  Prior head CT examination 01/05/2020. FINDINGS: Brain: Mild generalized cerebral atrophy. 2.0 x 1.7 x 1.6 cm enhancing lesion within the right aspect of the midbrain and pons (for instance as seen on series 5, image 9) (series 7, image 29). There is corresponding subtle ill-defined hyperdensity at this site on precontrast imaging, which may reflect a small amount of associated hemorrhage. 2.1 x 2.3 x 2.5 cm enhancing and centrally necrotic lesion within the inferomedial left cerebellar hemisphere (for instance as seen on series 5, image 6) (series 6, image 45). There is local mass effect with mild partial effacement of the inferior fourth ventricle. 2.5 x 2.0 x 2.0 cm enhancing and centrally necrotic lesion within the medial right occipital lobe, abutting the falx and right tentorium. There is corresponding subtle ill-defined hyperdensity at this site on precontrast imaging, which may reflect a small amount of associated hemorrhage. Additionally, there is mild surrounding vasogenic edema with partial effacement of the posterior right lateral ventricle. Background mild patchy and ill-defined hypoattenuation within the cerebral white matter is nonspecific, but compatible with chronic small vessel ischemic disease. No demarcated cortical infarct. No extra-axial fluid collection. No midline shift or hydrocephalus. Vascular: No hyperdense vessel.  Atherosclerotic calcifications Skull: No calvarial fracture or focal suspicious osseous lesion. Sinuses/Orbits: Visualized orbits show no acute finding. Mild mucosal thickening within the left frontal,  bilateral ethmoid and imaged left maxillary sinuses. IMPRESSION: Three intracranial metastatic lesions are identified, new as compared to the head CT of 01/05/2020. These lesions are located within the inferomedial left cerebellum, right midbrain/pons and right occipital lobe. The right occipital and left cerebellar lesions are largest, measuring 2.5 cm in greatest dimension. There is subtle precontrast hyperdensity associated with the lesions in the right occipital lobe and right midbrain/pons, which may reflect a small amount of  associated hemorrhage. Mild vasogenic edema surrounds the lesion within the right occipital lobe. The right occipital and left cerebellar lesions mildly efface the ventricular system. No midline shift or hydrocephalus. Background mild generalized parenchymal atrophy and chronic small vessel ischemic disease. Electronically Signed: By: Kellie Simmering DO On: 03/14/2021 13:35   CT Chest W Contrast  Result Date: 03/14/2021 CLINICAL DATA:  Restaging small cell lung cancer. EXAM: CT CHEST, ABDOMEN, AND PELVIS WITH CONTRAST TECHNIQUE: Multidetector CT imaging of the chest, abdomen and pelvis was performed following the standard protocol during bolus administration of intravenous contrast. CONTRAST:  57mL OMNIPAQUE IOHEXOL 300 MG/ML  SOLN COMPARISON:  12/20/2020 FINDINGS: CT CHEST FINDINGS Cardiovascular: Mild cardiac enlargement. Aortic atherosclerosis and coronary artery calcifications. Right chest wall pacer device noted with leads in the right atrial appendage and right ventricle. No pericardial effusion Mediastinum/Nodes: Thyroid gland is either surgically absent or atrophic. The trachea appears patent and is midline. There is mild circumferential wall thickening of the proximal esophagus. No enlarged axillary or supraclavicular lymph nodes. No enlarged mediastinal or hilar lymph nodes. Lungs/Pleura: No pleural effusion. Advanced changes of emphysema. No airspace consolidation, atelectasis  or pneumothorax. Medial right upper lobe lung nodule measures 1.8 cm, image 50/7. Unchanged from previous exam. 8 mm left lower lobe lung nodule with triangular configuration is stable, image 78/7. Small calcified granulomas are again noted. Musculoskeletal: No acute or suspicious osseous findings. Thoracic kyphosis with multilevel degenerative disc disease. CT ABDOMEN PELVIS FINDINGS Hepatobiliary: No focal liver abnormality is seen. No gallstones, gallbladder wall thickening, or biliary dilatation. Pancreas: Unremarkable. No pancreatic ductal dilatation or surrounding inflammatory changes. Spleen: Normal in size without focal abnormality. Adrenals/Urinary Tract: Normal adrenal glands. Interpolar left kidney cyst measures 1.2 cm and is unchanged compared with the previous exam, image 52/3. No hydronephrosis. Urinary bladder unremarkable. Stomach/Bowel: Stomach appears normal. No bowel wall thickening, inflammation or distension. Vascular/Lymphatic: Aortic atherosclerosis. No aneurysm. No abdominopelvic adenopathy. Reproductive: Uterus and bilateral adnexa are unremarkable. Other: No free fluid or fluid collections. Musculoskeletal: No acute or suspicious osseous findings. Right proximal femur fixation. Sclerosis within the left iliac bone is unchanged from previous exam. IMPRESSION: 1. Stable exam. Unchanged appearance of right upper lobe and left lower lobe lung nodules. No thoracic adenopathy. 2. No signs of metastatic disease within the abdomen or pelvis. 3. Coronary artery calcifications noted. Aortic Atherosclerosis (ICD10-I70.0) and Emphysema (ICD10-J43.9). Electronically Signed   By: Kerby Moors M.D.   On: 03/14/2021 14:47   CT Abdomen Pelvis W Contrast  Result Date: 03/14/2021 CLINICAL DATA:  Restaging small cell lung cancer. EXAM: CT CHEST, ABDOMEN, AND PELVIS WITH CONTRAST TECHNIQUE: Multidetector CT imaging of the chest, abdomen and pelvis was performed following the standard protocol during bolus  administration of intravenous contrast. CONTRAST:  72mL OMNIPAQUE IOHEXOL 300 MG/ML  SOLN COMPARISON:  12/20/2020 FINDINGS: CT CHEST FINDINGS Cardiovascular: Mild cardiac enlargement. Aortic atherosclerosis and coronary artery calcifications. Right chest wall pacer device noted with leads in the right atrial appendage and right ventricle. No pericardial effusion Mediastinum/Nodes: Thyroid gland is either surgically absent or atrophic. The trachea appears patent and is midline. There is mild circumferential wall thickening of the proximal esophagus. No enlarged axillary or supraclavicular lymph nodes. No enlarged mediastinal or hilar lymph nodes. Lungs/Pleura: No pleural effusion. Advanced changes of emphysema. No airspace consolidation, atelectasis or pneumothorax. Medial right upper lobe lung nodule measures 1.8 cm, image 50/7. Unchanged from previous exam. 8 mm left lower lobe lung nodule with triangular configuration is stable, image 78/7. Small  calcified granulomas are again noted. Musculoskeletal: No acute or suspicious osseous findings. Thoracic kyphosis with multilevel degenerative disc disease. CT ABDOMEN PELVIS FINDINGS Hepatobiliary: No focal liver abnormality is seen. No gallstones, gallbladder wall thickening, or biliary dilatation. Pancreas: Unremarkable. No pancreatic ductal dilatation or surrounding inflammatory changes. Spleen: Normal in size without focal abnormality. Adrenals/Urinary Tract: Normal adrenal glands. Interpolar left kidney cyst measures 1.2 cm and is unchanged compared with the previous exam, image 52/3. No hydronephrosis. Urinary bladder unremarkable. Stomach/Bowel: Stomach appears normal. No bowel wall thickening, inflammation or distension. Vascular/Lymphatic: Aortic atherosclerosis. No aneurysm. No abdominopelvic adenopathy. Reproductive: Uterus and bilateral adnexa are unremarkable. Other: No free fluid or fluid collections. Musculoskeletal: No acute or suspicious osseous  findings. Right proximal femur fixation. Sclerosis within the left iliac bone is unchanged from previous exam. IMPRESSION: 1. Stable exam. Unchanged appearance of right upper lobe and left lower lobe lung nodules. No thoracic adenopathy. 2. No signs of metastatic disease within the abdomen or pelvis. 3. Coronary artery calcifications noted. Aortic Atherosclerosis (ICD10-I70.0) and Emphysema (ICD10-J43.9). Electronically Signed   By: Kerby Moors M.D.   On: 03/14/2021 14:47      IMPRESSION/PLAN: This is a very pleasant 86 year old female with metastatic disease to the brain, small cell lung cancer, including a tumor in the brainstem.   I had a lengthy discussion with the patient after reviewing their MRI results with them.   Given the bulk of her disease and the locations of her disease I recommend starting radiation therapy as soon as possible to prevent further neurologic decline.  The goal is palliative, to shrink and control these lesions.  We can start her treatment tomorrow.  Anticipate 2 weeks of whole brain radiation therapy, 30 Gray in 10 fractions.  We discussed the risks benefits and side effects of whole brain radiation therapy.  Side effects may include but not necessarily be limited to fatigue, hair loss, scalp irritation, headaches, neurologic /cognitive decline, brain injury.  We discussed how the benefits of radiation outweigh the risks. I have contacted medical oncology (Cassandra H., PA) about her treatment start date.  I have also adjusted her dexamethasone so that she takes it 3 times a day and her daughter knows not to wake her up for doses but that she can double up doses as needed if patient sleeps through the usual dosage time.  They know to take this with food.  We did talk about seizure precautions and her daughter knows to call 911 if she has evidence of seizure activity.  We will proceed with CT simulation today and start treatment tomorrow  On date of service, in total, I  spent 50 minutes on this encounter. Patient was seen in person.  __________________________________________   [name], MD  This document serves as a record of services personally performed by [name], MD. It was created on her behalf by Roney Mans, a trained medical scribe. The creation of this record is based on the scribe's personal observations and the provider's statements to them. This document has been checked and approved by the attending provider.

## 2021-03-15 ENCOUNTER — Ambulatory Visit
Admission: RE | Admit: 2021-03-15 | Discharge: 2021-03-15 | Disposition: A | Discharge status: Home / Self Care | Payer: Medicare Other | Source: Ambulatory Visit | Attending: Radiation Oncology | Admitting: Radiation Oncology

## 2021-03-15 VITALS — BP 112/42 | HR 62 | Temp 97.9°F | Resp 20 | Ht 61.5 in | Wt 109.2 lb

## 2021-03-15 DIAGNOSIS — C7802 Secondary malignant neoplasm of left lung: Secondary | ICD-10-CM | POA: Insufficient documentation

## 2021-03-15 DIAGNOSIS — N281 Cyst of kidney, acquired: Secondary | ICD-10-CM | POA: Insufficient documentation

## 2021-03-15 DIAGNOSIS — C3411 Malignant neoplasm of upper lobe, right bronchus or lung: Secondary | ICD-10-CM | POA: Insufficient documentation

## 2021-03-15 DIAGNOSIS — Z79899 Other long term (current) drug therapy: Secondary | ICD-10-CM | POA: Insufficient documentation

## 2021-03-15 DIAGNOSIS — I480 Paroxysmal atrial fibrillation: Secondary | ICD-10-CM | POA: Insufficient documentation

## 2021-03-15 DIAGNOSIS — C7931 Secondary malignant neoplasm of brain: Secondary | ICD-10-CM | POA: Diagnosis not present

## 2021-03-15 DIAGNOSIS — J439 Emphysema, unspecified: Secondary | ICD-10-CM | POA: Insufficient documentation

## 2021-03-15 DIAGNOSIS — E039 Hypothyroidism, unspecified: Secondary | ICD-10-CM | POA: Insufficient documentation

## 2021-03-15 DIAGNOSIS — I10 Essential (primary) hypertension: Secondary | ICD-10-CM | POA: Insufficient documentation

## 2021-03-15 DIAGNOSIS — Z510 Encounter for antineoplastic radiation therapy: Secondary | ICD-10-CM | POA: Diagnosis not present

## 2021-03-15 DIAGNOSIS — I700 Atherosclerosis of aorta: Secondary | ICD-10-CM | POA: Diagnosis not present

## 2021-03-15 DIAGNOSIS — E119 Type 2 diabetes mellitus without complications: Secondary | ICD-10-CM | POA: Diagnosis not present

## 2021-03-15 DIAGNOSIS — I2510 Atherosclerotic heart disease of native coronary artery without angina pectoris: Secondary | ICD-10-CM | POA: Insufficient documentation

## 2021-03-15 DIAGNOSIS — I119 Hypertensive heart disease without heart failure: Secondary | ICD-10-CM | POA: Insufficient documentation

## 2021-03-15 DIAGNOSIS — J449 Chronic obstructive pulmonary disease, unspecified: Secondary | ICD-10-CM | POA: Insufficient documentation

## 2021-03-15 DIAGNOSIS — C3491 Malignant neoplasm of unspecified part of right bronchus or lung: Secondary | ICD-10-CM | POA: Diagnosis not present

## 2021-03-15 MED ORDER — DEXAMETHASONE 4 MG PO TABS: ORAL_TABLET | ORAL | 0 refills | Status: DC

## 2021-03-15 NOTE — Progress Notes (Signed)
[name]  [name], Robin G, MD [place] [address]  DIAGNOSIS: Extensive stage (T2 a, N2, M1 C) small cell lung cancer presented with central right upper lobe lung mass in addition to left lower lobe pulmonary metastasis and bilateral hilar, subcarinal and bilateral paratracheal and left prevascular lymphadenopathy in addition to bone metastasis in the left iliac wing diagnosed in April 2021.  She was found to have new metastatic disease to the brain in June 2022  PRIOR THERAPY: none  CURRENT THERAPY: 1) Systemic chemotherapy with carboplatin for AUC of 5 on day 1, etoposide 100 mg/M2 on days 1, 2 and 3 with Neulasta support in addition to Imfinzi 1500 mg IV every 3 weeks during chemotherapy followed by maintenance Imfinzi 1500 mg IV every 4 weeks after cycle #4.  Status post 15 cycles. She started maintenance immunotherapy with single agent Imfinzi starting from cycle #5  2) Whole brain radiation under the care of Dr. Isidore Moos. Last dose expected on 03/29/21.  INTERVAL HISTORY: [name] 86 y.o. female returns to the clinic today for a follow-up visit accompanied by her daughter. The patient was last seen by Dr. Julien Nordmann.  At that appointment, the patient was endorsing a new onset headache that she would sometimes wake up with.  The patient cannot have a brain MRI due to her pacemaker; therefore, she had a CT of the head which unfortunately showed new metastatic disease to the brain and some associated hemorrhage. Her blood thinner is currently on hold. I am assuming she is on this for atrial fibrillation and she has an appointment with cardiology later today. The patient was started on Decadron and referred to radiation oncology.  She met with Dr. Isidore Moos yesterday who is planning to perform whole brain radiation. Her first dose of treatment is scheduled for today.   Otherwise, the patient denies any new concerning complaints today.  She  denies any new fevers, chills, night sweats, or unexplained weight loss. Her appetite has improved some since starting the steroids and she has been sleeping well. She reports her baseline dyspnea on exertion which is still greatly improved from when she was first diagnosed and on supplemental oxygen. She is using a wheelchair for her visit today due to her dyspnea on exertion although she states it comes and goes. The patient reports mild hoarseness this morning and her baseline cough. She denies hemoptysis. She denies any nausea or vomiting and reports no changes in her baseline diarrhea.  She denies any constipation.  She reports since starting the immunotherapy she has had skin changes to her groin and thigh area on the right side. She states she had "boils" that appeared after her treatment and then later described them as "hard knots". She states they later burst and then healed.  She has been applying hydrocortisone cream which has provided some improvement. She states this has not happened before and denies other rashes or skin changes.  She continues to have intermittent headaches often in the morning upon waking. Her pain levels have improved since last visit and reports no pain today. The patient recently had a restaging CT scan the chest, abdomen, pelvis, and brain.  She is here today for evaluation and to review her scan results.   MEDICAL HISTORY: Past Medical History:  Diagnosis Date   CAD (coronary artery disease), native coronary artery    PTCA of distal right coronary artery 1997 Cypher stents to circumflex 2005 Cardiac cath in  2011 with patent stent to circumflex and moderate disease elsewhere treated medically    Cancer [facility])    Cardiac pacemaker in situ 12/24/2015   Original implant reportedly in 1991 for tachybradycardia syndrome, generator change in 1999, 2004 and evidently again in 2016 in [place]    COPD (chronic obstructive pulmonary disease) ([place])    Diabetes mellitus without  complication ([place])    Hypertension    Hypothyroidism 03/23/2010   Pacemaker    Paroxysmal atrial fibrillation ([place]) 03/23/2010   CHA2DS2VASC score 5     ALLERGIES:  is allergic to seasonal ic [cholestatin].  MEDICATIONS:  Current Outpatient Medications  Medication Sig Dispense Refill   acetaminophen (TYLENOL) 500 MG tablet Take 500 mg by mouth every 6 (six) hours as needed for headache.     albuterol (VENTOLIN HFA) 108 (90 Base) MCG/ACT inhaler TAKE 2 PUFFS BY MOUTH EVERY 6 HOURS AS NEEDED FOR WHEEZE OR SHORTNESS OF BREATH 1 each 1   atorvastatin (LIPITOR) 10 MG tablet TAKE 1 TABLET BY MOUTH EVERY DAY 90 tablet 0   carvedilol (COREG) 12.5 MG tablet TAKE 1 TABLET (12.5 MG TOTAL) BY MOUTH 2 (TWO) TIMES DAILY WITH A MEAL. 180 tablet 0   dexamethasone (DECADRON) 4 MG tablet Take 1 tablet three times a day with food. 60 tablet 0   diclofenac sodium (VOLTAREN) 1 % GEL Apply 4 [name] topically 4 (four) times daily. 4 Tube 3   fenofibrate (TRICOR) 48 MG tablet TAKE 1 TABLET BY MOUTH EVERY DAY 90 tablet 3   glucose blood (ONETOUCH VERIO) test strip Use to test 3-4 times daily. 300 strip 4   KLOR-CON M20 20 MEQ tablet TAKE 1 TABLET BY MOUTH TWICE A DAY FOR 2 DAYS THEN TAKE 1 TABLET BY MOUTH EVERY DAY 90 tablet 1   levothyroxine (SYNTHROID) 112 MCG tablet Take 1 tablet (112 mcg total) by mouth daily before breakfast. 90 tablet 2   lidocaine-prilocaine (EMLA) cream Apply 1 application topically as needed. 30 [name] 2   Loperamide HCl (IMODIUM PO) Take by mouth as needed.     losartan (COZAAR) 100 MG tablet Take 1 tablet (100 mg total) by mouth daily. 90 tablet 2   nitroGLYCERIN (NITROSTAT) 0.4 MG SL tablet Place 1 tablet (0.4 mg total) under the tongue every 5 (five) minutes x 3 doses as needed for chest pain (Max 3 doses within 15 min. Call 911). 10 tablet 0   oxyCODONE-acetaminophen (PERCOCET/ROXICET) 5-325 MG tablet Take 1 tablet by mouth every 8 (eight) hours as needed for severe pain. 20 tablet 0    pantoprazole (PROTONIX) 40 MG tablet TAKE 1 TABLET BY MOUTH EVERY DAY 90 tablet 1   prochlorperazine (COMPAZINE) 10 MG tablet Take 1 tablet (10 mg total) by mouth every 6 (six) hours as needed for nausea or vomiting. 30 tablet 1   nicotine (NICODERM CQ - DOSED IN MG/24 HOURS) 14 mg/24hr patch PLACE 1 PATCH ONTO THE SKIN DAILY. (Patient not taking: Reported on 03/16/2021) 28 patch 0   No current facility-administered medications for this visit.    SURGICAL HISTORY:  Past Surgical History:  Procedure Laterality Date   BIOPSY  01/08/2020   Procedure: BIOPSY;  Surgeon: Garner Nash, DO;  Location: [facility];  Service: Pulmonary;;   BRONCHIAL BRUSHINGS  01/08/2020   Procedure: BRONCHIAL BRUSHINGS;  Surgeon: Garner Nash, DO;  Location: [facility];  Service: Pulmonary;;   BRONCHIAL WASHINGS  01/08/2020   Procedure: BRONCHIAL WASHINGS;  Surgeon: Garner Nash, DO;  Location: [facility];  Service: Pulmonary;;   CARDIAC CATHETERIZATION N/A 12/26/2015   Procedure: Left Heart Cath and Coronary Angiography;  Surgeon: Peter M [name], MD;  Location: [facility];  Service: Cardiovascular;  Laterality: N/A;   CARDIOVERSION  05/2015   CHOLECYSTECTOMY  2012   CORONARY ANGIOPLASTY WITH STENT PLACEMENT  1990   ENDOBRONCHIAL ULTRASOUND  01/08/2020   Procedure: ENDOBRONCHIAL ULTRASOUND;  Surgeon: Garner Nash, DO;  Location: [facility];  Service: Pulmonary;;   FINE NEEDLE ASPIRATION BIOPSY  01/08/2020   Procedure: FINE NEEDLE ASPIRATION BIOPSY;  Surgeon: Garner Nash, DO;  Location: [facility];  Service: Pulmonary;;   HERNIA REPAIR     IR IMAGING GUIDED PORT INSERTION  02/18/2020   PACEMAKER GENERATOR CHANGE  [id number], 2016   PACEMAKER INSERTION  1991   VIDEO BRONCHOSCOPY WITH ENDOBRONCHIAL ULTRASOUND N/A 01/08/2020   Procedure: VIDEO BRONCHOSCOPY;  Surgeon: Garner Nash, DO;  Location: [facility];  Service: Pulmonary;  Laterality: N/A;    REVIEW OF SYSTEMS:   Review of  Systems  Constitutional: Reports some improved appetite changes and denies chills, fatigue, fever and unexpected weight change.  HENT:   Negative for mouth sores, nosebleeds, sore throat and trouble swallowing.   Eyes: Negative for eye problems and icterus.  Respiratory: Positive for baseline cough and shortness of breath with exertion. Negative for hemoptysis and wheezing.   Cardiovascular: Negative for chest pain and leg swelling.  Gastrointestinal: Positive for baseline diarrhea. Negative for abdominal pain, constipation, nausea and vomiting.  Genitourinary: Negative for bladder incontinence, difficulty urinating, dysuria, frequency and hematuria.   Musculoskeletal: Negative for back pain, gait problem, neck pain and neck stiffness.  Skin: Positive for skin changes as described in HPI.  Negative for itching.  Neurological:Positive headaches.  Negative for dizziness, extremity weakness, gait problem, light-headedness and seizures.  Hematological: Negative for adenopathy. Does not bruise/bleed easily.  Psychiatric/Behavioral: Negative for confusion, depression and sleep disturbance. The patient is not nervous/anxious.     PHYSICAL EXAMINATION:  Blood pressure (!) 153/53, pulse 61, temperature (!) 96.3 F (35.7 C), temperature source Tympanic, resp. rate 20, height 5' 1.5" (1.562 m), weight 109 lb 6.4 oz (49.6 kg), SpO2 100 %.  ECOG PERFORMANCE STATUS: 1-2  Physical Exam  Constitutional: Oriented to person, place, and time and thin appearing female and in no distress. HENT: Head: Normocephalic and atraumatic. Mouth/Throat: Oropharynx is clear and moist. No oropharyngeal exudate. Eyes: Conjunctivae are normal. Right eye exhibits no discharge. Left eye exhibits no discharge. No scleral icterus. Neck: Normal range of motion. Neck supple. Cardiovascular: Normal rate, regular rhythm, normal heart sounds and intact distal pulses.   Pulmonary/Chest: Effort normal. Quiet breath sounds in all  lung fields. No respiratory distress. No wheezes. No rales. Abdominal: Soft. Bowel sounds are normal. Exhibits no distension and no mass.  Musculoskeletal: Normal range of motion. Exhibits no edema.  Lymphadenopathy:    No cervical adenopathy.  Neurological: Alert and oriented to person, place, and time. Exhibits normal muscle tone. Examined in the wheelchair.  Skin: A few scattered dry appearing skin lesions on right abdomen and left shoulder. Skin is warm and dry. Not diaphoretic. No erythema. No pallor.  Psychiatric: Mood, memory and judgment normal. Vitals reviewed.  LABORATORY DATA: Lab Results  Component Value Date   WBC 7.6 03/16/2021   HGB 10.1 (L) 03/16/2021   HCT 31.3 (L) 03/16/2021   MCV 92.9 03/16/2021   PLT 128 (L) 03/16/2021      Chemistry  Component Value Date/Time   NA 142 03/16/2021 0949   NA 143 03/17/2020 0915   K 4.4 03/16/2021 0949   CL 111 03/16/2021 0949   CO2 25 03/16/2021 0949   BUN 20 03/16/2021 0949   BUN 6 (L) 03/17/2020 0915   CREATININE 0.75 03/16/2021 0949      Component Value Date/Time   CALCIUM 9.6 03/16/2021 0949   ALKPHOS 81 03/16/2021 0949   AST 26 03/16/2021 0949   ALT 29 03/16/2021 0949   BILITOT 0.4 03/16/2021 0949       RADIOGRAPHIC STUDIES:  CT Head W Wo Contrast  Addendum Date: 03/14/2021   ADDENDUM REPORT: 03/14/2021 14:19 ADDENDUM: These results were called by telephone at the time of interpretation on 03/14/2021 at 2:18 pm to provider Dr. Lorenso Courier, who verbally acknowledged these results. Electronically Signed   By: Kellie Simmering DO   On: 03/14/2021 14:19   Result Date: 03/14/2021 CLINICAL DATA:  No new neoplasm of unspecified part of unspecified bronchus or lung. Small cell lung cancer, staging; headache. EXAM: CT HEAD WITHOUT AND WITH CONTRAST TECHNIQUE: Contiguous axial images were obtained from the base of the skull through the vertex without and with intravenous contrast CONTRAST:  57m OMNIPAQUE IOHEXOL 300 MG/ML  SOLN  COMPARISON:  Prior head CT examination 01/05/2020. FINDINGS: Brain: Mild generalized cerebral atrophy. 2.0 x 1.7 x 1.6 cm enhancing lesion within the right aspect of the midbrain and pons (for instance as seen on series 5, image 9) (series 7, image 29). There is corresponding subtle ill-defined hyperdensity at this site on precontrast imaging, which may reflect a small amount of associated hemorrhage. 2.1 x 2.3 x 2.5 cm enhancing and centrally necrotic lesion within the inferomedial left cerebellar hemisphere (for instance as seen on series 5, image 6) (series 6, image 45). There is local mass effect with mild partial effacement of the inferior fourth ventricle. 2.5 x 2.0 x 2.0 cm enhancing and centrally necrotic lesion within the medial right occipital lobe, abutting the falx and right tentorium. There is corresponding subtle ill-defined hyperdensity at this site on precontrast imaging, which may reflect a small amount of associated hemorrhage. Additionally, there is mild surrounding vasogenic edema with partial effacement of the posterior right lateral ventricle. Background mild patchy and ill-defined hypoattenuation within the cerebral white matter is nonspecific, but compatible with chronic small vessel ischemic disease. No demarcated cortical infarct. No extra-axial fluid collection. No midline shift or hydrocephalus. Vascular: No hyperdense vessel.  Atherosclerotic calcifications Skull: No calvarial fracture or focal suspicious osseous lesion. Sinuses/Orbits: Visualized orbits show no acute finding. Mild mucosal thickening within the left frontal, bilateral ethmoid and imaged left maxillary sinuses. IMPRESSION: Three intracranial metastatic lesions are identified, new as compared to the head CT of 01/05/2020. These lesions are located within the inferomedial left cerebellum, right midbrain/pons and right occipital lobe. The right occipital and left cerebellar lesions are largest, measuring 2.5 cm in greatest  dimension. There is subtle precontrast hyperdensity associated with the lesions in the right occipital lobe and right midbrain/pons, which may reflect a small amount of associated hemorrhage. Mild vasogenic edema surrounds the lesion within the right occipital lobe. The right occipital and left cerebellar lesions mildly efface the ventricular system. No midline shift or hydrocephalus. Background mild generalized parenchymal atrophy and chronic small vessel ischemic disease. Electronically Signed: By: KKellie SimmeringDO On: 03/14/2021 13:35   CT Chest W Contrast  Result Date: 03/14/2021 CLINICAL DATA:  Restaging small cell lung cancer. EXAM: CT CHEST, ABDOMEN, AND  PELVIS WITH CONTRAST TECHNIQUE: Multidetector CT imaging of the chest, abdomen and pelvis was performed following the standard protocol during bolus administration of intravenous contrast. CONTRAST:  40m OMNIPAQUE IOHEXOL 300 MG/ML  SOLN COMPARISON:  12/20/2020 FINDINGS: CT CHEST FINDINGS Cardiovascular: Mild cardiac enlargement. Aortic atherosclerosis and coronary artery calcifications. Right chest wall pacer device noted with leads in the right atrial appendage and right ventricle. No pericardial effusion Mediastinum/Nodes: Thyroid gland is either surgically absent or atrophic. The trachea appears patent and is midline. There is mild circumferential wall thickening of the proximal esophagus. No enlarged axillary or supraclavicular lymph nodes. No enlarged mediastinal or hilar lymph nodes. Lungs/Pleura: No pleural effusion. Advanced changes of emphysema. No airspace consolidation, atelectasis or pneumothorax. Medial right upper lobe lung nodule measures 1.8 cm, image 50/7. Unchanged from previous exam. 8 mm left lower lobe lung nodule with triangular configuration is stable, image 78/7. Small calcified granulomas are again noted. Musculoskeletal: No acute or suspicious osseous findings. Thoracic kyphosis with multilevel degenerative disc disease. CT  ABDOMEN PELVIS FINDINGS Hepatobiliary: No focal liver abnormality is seen. No gallstones, gallbladder wall thickening, or biliary dilatation. Pancreas: Unremarkable. No pancreatic ductal dilatation or surrounding inflammatory changes. Spleen: Normal in size without focal abnormality. Adrenals/Urinary Tract: Normal adrenal glands. Interpolar left kidney cyst measures 1.2 cm and is unchanged compared with the previous exam, image 52/3. No hydronephrosis. Urinary bladder unremarkable. Stomach/Bowel: Stomach appears normal. No bowel wall thickening, inflammation or distension. Vascular/Lymphatic: Aortic atherosclerosis. No aneurysm. No abdominopelvic adenopathy. Reproductive: Uterus and bilateral adnexa are unremarkable. Other: No free fluid or fluid collections. Musculoskeletal: No acute or suspicious osseous findings. Right proximal femur fixation. Sclerosis within the left iliac bone is unchanged from previous exam. IMPRESSION: 1. Stable exam. Unchanged appearance of right upper lobe and left lower lobe lung nodules. No thoracic adenopathy. 2. No signs of metastatic disease within the abdomen or pelvis. 3. Coronary artery calcifications noted. Aortic Atherosclerosis (ICD10-I70.0) and Emphysema (ICD10-J43.9). Electronically Signed   By: TKerby MoorsM.D.   On: 03/14/2021 14:47   CT Abdomen Pelvis W Contrast  Result Date: 03/14/2021 CLINICAL DATA:  Restaging small cell lung cancer. EXAM: CT CHEST, ABDOMEN, AND PELVIS WITH CONTRAST TECHNIQUE: Multidetector CT imaging of the chest, abdomen and pelvis was performed following the standard protocol during bolus administration of intravenous contrast. CONTRAST:  728mOMNIPAQUE IOHEXOL 300 MG/ML  SOLN COMPARISON:  12/20/2020 FINDINGS: CT CHEST FINDINGS Cardiovascular: Mild cardiac enlargement. Aortic atherosclerosis and coronary artery calcifications. Right chest wall pacer device noted with leads in the right atrial appendage and right ventricle. No pericardial  effusion Mediastinum/Nodes: Thyroid gland is either surgically absent or atrophic. The trachea appears patent and is midline. There is mild circumferential wall thickening of the proximal esophagus. No enlarged axillary or supraclavicular lymph nodes. No enlarged mediastinal or hilar lymph nodes. Lungs/Pleura: No pleural effusion. Advanced changes of emphysema. No airspace consolidation, atelectasis or pneumothorax. Medial right upper lobe lung nodule measures 1.8 cm, image 50/7. Unchanged from previous exam. 8 mm left lower lobe lung nodule with triangular configuration is stable, image 78/7. Small calcified granulomas are again noted. Musculoskeletal: No acute or suspicious osseous findings. Thoracic kyphosis with multilevel degenerative disc disease. CT ABDOMEN PELVIS FINDINGS Hepatobiliary: No focal liver abnormality is seen. No gallstones, gallbladder wall thickening, or biliary dilatation. Pancreas: Unremarkable. No pancreatic ductal dilatation or surrounding inflammatory changes. Spleen: Normal in size without focal abnormality. Adrenals/Urinary Tract: Normal adrenal glands. Interpolar left kidney cyst measures 1.2 cm and is unchanged compared with the previous  exam, image 52/3. No hydronephrosis. Urinary bladder unremarkable. Stomach/Bowel: Stomach appears normal. No bowel wall thickening, inflammation or distension. Vascular/Lymphatic: Aortic atherosclerosis. No aneurysm. No abdominopelvic adenopathy. Reproductive: Uterus and bilateral adnexa are unremarkable. Other: No free fluid or fluid collections. Musculoskeletal: No acute or suspicious osseous findings. Right proximal femur fixation. Sclerosis within the left iliac bone is unchanged from previous exam. IMPRESSION: 1. Stable exam. Unchanged appearance of right upper lobe and left lower lobe lung nodules. No thoracic adenopathy. 2. No signs of metastatic disease within the abdomen or pelvis. 3. Coronary artery calcifications noted. Aortic  Atherosclerosis (ICD10-I70.0) and Emphysema (ICD10-J43.9). Electronically Signed   By: Kerby Moors M.D.   On: 03/14/2021 14:47     ASSESSMENT/PLAN:  This is a very pleasant 86 year old [place] American female with extensive stage small cell lung cancer. She presented with a central right upper lobe lung mass in addition to left lower lobe nodule and mediastinal lymphadenopathy and a metastatic bone lesion in the left iliac wing. She was diagnosed in April 2021.  She was found to have metastatic disease to the brain in June 2022   The patient is currently undergoing treatment with palliative systemic chemotherapy with carboplatin for an AUC of 5 on day 1, etoposide 100 mg/m2 on days 1, 2, and 3 and Imfinzi 1500 mg every 3 weeks. Starting from cycle #5, the patient has been on maintenance immunotherapy with Imfinzi. She is status post 15 cycles.   The patient was found to have new metastatic disease to the brain in June 2022.  She is currently being seen by Dr. Isidore Moos who is planning to do whole brain radiation starting tomorrow.   The patient recently had a restaging CT scan performed.  Dr. Julien Nordmann personally and independently reviewed the scan and discussed the results with the patient today.  The scan did not show any evidence of disease progression in the chest, abdomen, or pelvis.  Dr. Julien Nordmann recommends that the patient continue on the same treatment at the same dose.   Her treatment will be held today per Dr. Julien Nordmann with her current steroid dose and upcoming radiation.   Her blood thinner on hold due to some bleeding noted on her CT of the head, and per Dr. Julien Nordmann will resume after she has completed radiation.   We will see her back for follow-up visit in 3 weeks for evaluation before starting cycle #17  The patient was advised to call immediately if she has any concerning symptoms in the interval. The patient voices understanding of current disease status and treatment options and is in  agreement with the current care plan. All questions were answered. The patient knows to call the clinic with any problems, questions or concerns. We can certainly see the patient much sooner if necessary      No orders of the defined types were placed in this encounter.   Jovontae Banko L Adya Wirz, PA-C 03/16/21  ADDENDUM: Hematology/Oncology Attending: I had a face-to-face encounter with the patient.  I reviewed her records, scan and recommended her care plan.  This is a very pleasant 86 years old African-American female with extensive stage small cell lung cancer diagnosed in April 2021.  The patient started induction systemic chemotherapy with carboplatin, etoposide and Imfinzi for 4 cycles followed by maintenance treatment with Imfinzi for a total of 15 cycles and she has been tolerating this treatment well. Unfortunately the patient had MRI of the brain performed recently for evaluation of recurrent headache and it showed  3 intracranial metastatic lesion new compared to the previous restaging scan.  The patient is started on tapered dose of Decadron and she was seen by Dr. Isidore Moos for consideration of whole brain irradiation. The patient also had staging CT scan of the chest, abdomen pelvis performed recently.  I personally and independently reviewed the scans and discussed the results with the patient today. Her scan showed no concerning findings for disease progression in the chest, abdomen and pelvis. I recommended for her to continue her current treatment with maintenance Imfinzi but after completion of the whole brain irradiation and tapering of the Decadron. We will see her back for follow-up visit in around 3 weeks for evaluation and resuming her treatment. The patient was advised to call immediately if she has any other concerning symptoms in the interval.  Disclaimer: This [name] was dictated with voice recognition software. Similar sounding words can inadvertently be transcribed  and may be missed upon review. Eilleen Kempf, MD 03/17/21

## 2021-03-16 ENCOUNTER — Other Ambulatory Visit: Payer: Self-pay

## 2021-03-16 ENCOUNTER — Other Ambulatory Visit: Payer: Medicare Other

## 2021-03-16 ENCOUNTER — Ambulatory Visit
Admission: RE | Admit: 2021-03-16 | Discharge: 2021-03-16 | Disposition: A | Discharge status: Home / Self Care | Payer: Medicare Other | Source: Ambulatory Visit | Attending: Radiation Oncology | Admitting: Radiation Oncology

## 2021-03-16 ENCOUNTER — Encounter: Payer: Self-pay | Admitting: Physician Assistant

## 2021-03-16 ENCOUNTER — Ambulatory Visit (INDEPENDENT_AMBULATORY_CARE_PROVIDER_SITE_OTHER): Payer: Medicare Other | Admitting: Cardiology

## 2021-03-16 ENCOUNTER — Inpatient Hospital Stay (HOSPITAL_BASED_OUTPATIENT_CLINIC_OR_DEPARTMENT_OTHER): Payer: Medicare Other | Admitting: Physician Assistant

## 2021-03-16 ENCOUNTER — Encounter: Payer: Self-pay | Admitting: Cardiology

## 2021-03-16 ENCOUNTER — Inpatient Hospital Stay: Payer: Medicare Other

## 2021-03-16 ENCOUNTER — Telehealth: Payer: Self-pay | Admitting: Cardiology

## 2021-03-16 ENCOUNTER — Inpatient Hospital Stay: Payer: Medicare Other | Attending: Internal Medicine

## 2021-03-16 VITALS — BP 153/53 | HR 61 | Temp 96.3°F | Resp 20 | Ht 61.5 in | Wt 109.4 lb

## 2021-03-16 VITALS — BP 130/54 | HR 67 | Ht 62.0 in | Wt 110.4 lb

## 2021-03-16 DIAGNOSIS — C7931 Secondary malignant neoplasm of brain: Secondary | ICD-10-CM | POA: Insufficient documentation

## 2021-03-16 DIAGNOSIS — C7951 Secondary malignant neoplasm of bone: Secondary | ICD-10-CM | POA: Diagnosis not present

## 2021-03-16 DIAGNOSIS — Z5112 Encounter for antineoplastic immunotherapy: Secondary | ICD-10-CM | POA: Diagnosis present

## 2021-03-16 DIAGNOSIS — C3411 Malignant neoplasm of upper lobe, right bronchus or lung: Secondary | ICD-10-CM | POA: Insufficient documentation

## 2021-03-16 DIAGNOSIS — C3491 Malignant neoplasm of unspecified part of right bronchus or lung: Secondary | ICD-10-CM

## 2021-03-16 DIAGNOSIS — Z452 Encounter for adjustment and management of vascular access device: Secondary | ICD-10-CM | POA: Insufficient documentation

## 2021-03-16 DIAGNOSIS — I480 Paroxysmal atrial fibrillation: Secondary | ICD-10-CM | POA: Insufficient documentation

## 2021-03-16 DIAGNOSIS — E039 Hypothyroidism, unspecified: Secondary | ICD-10-CM | POA: Insufficient documentation

## 2021-03-16 DIAGNOSIS — I495 Sick sinus syndrome: Secondary | ICD-10-CM

## 2021-03-16 DIAGNOSIS — Z510 Encounter for antineoplastic radiation therapy: Secondary | ICD-10-CM | POA: Diagnosis not present

## 2021-03-16 DIAGNOSIS — Z95828 Presence of other vascular implants and grafts: Secondary | ICD-10-CM

## 2021-03-16 LAB — CBC WITH DIFFERENTIAL (CANCER CENTER ONLY)
Abs Immature Granulocytes: 0.03 10*3/uL (ref 0.00–0.07)
Basophils Absolute: 0 10*3/uL (ref 0.0–0.1)
Basophils Relative: 0 %
Eosinophils Absolute: 0 10*3/uL (ref 0.0–0.5)
Eosinophils Relative: 0 %
HCT: 31.3 % — ABNORMAL LOW (ref 36.0–46.0)
Hemoglobin: 10.1 g/dL — ABNORMAL LOW (ref 12.0–15.0)
Immature Granulocytes: 0 %
Lymphocytes Relative: 9 %
Lymphs Abs: 0.7 10*3/uL (ref 0.7–4.0)
MCH: 30 pg (ref 26.0–34.0)
MCHC: 32.3 g/dL (ref 30.0–36.0)
MCV: 92.9 fL (ref 80.0–100.0)
Monocytes Absolute: 0.6 10*3/uL (ref 0.1–1.0)
Monocytes Relative: 7 %
Neutro Abs: 6.4 10*3/uL (ref 1.7–7.7)
Neutrophils Relative %: 84 %
Platelet Count: 128 10*3/uL — ABNORMAL LOW (ref 150–400)
RBC: 3.37 MIL/uL — ABNORMAL LOW (ref 3.87–5.11)
RDW: 14.3 % (ref 11.5–15.5)
WBC Count: 7.6 10*3/uL (ref 4.0–10.5)
nRBC: 0 % (ref 0.0–0.2)

## 2021-03-16 LAB — CMP (CANCER CENTER ONLY)
ALT: 29 U/L (ref 0–44)
AST: 26 U/L (ref 15–41)
Albumin: 3.7 g/dL (ref 3.5–5.0)
Alkaline Phosphatase: 81 U/L (ref 38–126)
Anion gap: 6 (ref 5–15)
BUN: 20 mg/dL (ref 8–23)
CO2: 25 mmol/L (ref 22–32)
Calcium: 9.6 mg/dL (ref 8.9–10.3)
Chloride: 111 mmol/L (ref 98–111)
Creatinine: 0.75 mg/dL (ref 0.44–1.00)
GFR, Estimated: >60 mL/min (ref >60)
Glucose, Bld: 137 mg/dL — ABNORMAL HIGH (ref 70–99)
Potassium: 4.4 mmol/L (ref 3.5–5.1)
Sodium: 142 mmol/L (ref 135–145)
Total Bilirubin: 0.4 mg/dL (ref 0.3–1.2)
Total Protein: 6.9 g/dL (ref 6.5–8.1)

## 2021-03-16 LAB — TSH: TSH: 0.217 u[IU]/mL — ABNORMAL LOW (ref 0.308–3.960)

## 2021-03-16 MED ORDER — HEPARIN SOD (PORK) LOCK FLUSH 100 UNIT/ML IV SOLN
500.0000 [IU] | Freq: Once | INTRAVENOUS | Status: AC
  Administered 2021-03-16: 500 [IU]
  Filled 2021-03-16: qty 5

## 2021-03-16 MED ORDER — SODIUM CHLORIDE 0.9% FLUSH
10.0000 mL | Freq: Once | INTRAVENOUS | Status: AC
  Administered 2021-03-16: 10 mL
  Filled 2021-03-16: qty 10

## 2021-03-16 NOTE — Progress Notes (Signed)
Per Cassie Hellingoepter PA, no treatment today.

## 2021-03-16 NOTE — Progress Notes (Signed)
Electrophysiology Office Note   Date:  03/16/2021   ID:  [name], DOB Feb 19, 1935, MRN [id number]  PCP:  [name], [name], MD  Cardiologist:  Tamala Julian Primary Electrophysiologist:  Constance Haw, MD    CC: Follow up for atrial fibrillation, tachybradycardia syndrome, and pacemaker.   History of Present Illness: [name] is a 86 y.o. female who presents today for electrophysiology follow up.     She has a history of coronary artery disease with previous stenting to the circumflex and RCA, hypertension, diabetes, COPD, atrial fibrillation.  She has tachybradycardia syndrome and is status post Catering manager.  Today, denies symptoms of palpitations, chest pain, shortness of breath, orthopnea, PND, lower extremity edema, claudication, dizziness, presyncope, syncope, bleeding, or neurologic sequela. The patient is tolerating medications without difficulties.  She currently feels well.  She has no chest pain or shortness of breath.  She is somewhat fatigued.  She is in atrial fibrillation and has had the discussion to remain in atrial fibrillation.  She unfortunately has had lung cancer in the past and is now found a spot on her brain.  She is planned to have radiation therapy.  Past Medical History:  Diagnosis Date   CAD (coronary artery disease), native coronary artery    PTCA of distal right coronary artery 1997 Cypher stents to circumflex 2005 Cardiac cath in 2011 with patent stent to circumflex and moderate disease elsewhere treated medically    Cancer ([place])    Cardiac pacemaker in situ 12/24/2015   Original implant reportedly in 1991 for tachybradycardia syndrome, generator change in 1999, 2004 and evidently again in 2016 in [place]    COPD (chronic obstructive pulmonary disease) ([place])    Diabetes mellitus without complication ([place])    Hypertension    Hypothyroidism 03/23/2010   Pacemaker    Paroxysmal atrial fibrillation ([place]) 03/23/2010    CHA2DS2VASC score 5    Past Surgical History:  Procedure Laterality Date   BIOPSY  01/08/2020   Procedure: BIOPSY;  Surgeon: Garner Nash, DO;  Location: [facility];  Service: Pulmonary;;   BRONCHIAL BRUSHINGS  01/08/2020   Procedure: BRONCHIAL BRUSHINGS;  Surgeon: Garner Nash, DO;  Location: [facility];  Service: Pulmonary;;   BRONCHIAL WASHINGS  01/08/2020   Procedure: BRONCHIAL WASHINGS;  Surgeon: Garner Nash, DO;  Location: [facility];  Service: Pulmonary;;   CARDIAC CATHETERIZATION N/A 12/26/2015   Procedure: Left Heart Cath and Coronary Angiography;  Surgeon: Peter M [name], MD;  Location: [facility];  Service: Cardiovascular;  Laterality: N/A;   CARDIOVERSION  05/2015   CHOLECYSTECTOMY  2012   CORONARY ANGIOPLASTY WITH STENT PLACEMENT  1990   ENDOBRONCHIAL ULTRASOUND  01/08/2020   Procedure: ENDOBRONCHIAL ULTRASOUND;  Surgeon: Garner Nash, DO;  Location: [facility];  Service: Pulmonary;;   FINE NEEDLE ASPIRATION BIOPSY  01/08/2020   Procedure: FINE NEEDLE ASPIRATION BIOPSY;  Surgeon: Garner Nash, DO;  Location: [facility];  Service: Pulmonary;;   HERNIA REPAIR     IR IMAGING GUIDED PORT INSERTION  02/18/2020   PACEMAKER GENERATOR CHANGE  9924,2683, 2016   PACEMAKER INSERTION  1991   VIDEO BRONCHOSCOPY WITH ENDOBRONCHIAL ULTRASOUND N/A 01/08/2020   Procedure: VIDEO BRONCHOSCOPY;  Surgeon: Garner Nash, DO;  Location: [facility];  Service: Pulmonary;  Laterality: N/A;     Current Outpatient Medications  Medication Sig Dispense Refill   acetaminophen (TYLENOL) 500 MG tablet Take 500 mg by mouth every 6 (six) hours as needed  for headache.     albuterol (VENTOLIN HFA) 108 (90 Base) MCG/ACT inhaler TAKE 2 PUFFS BY MOUTH EVERY 6 HOURS AS NEEDED FOR WHEEZE OR SHORTNESS OF BREATH 1 each 1   atorvastatin (LIPITOR) 10 MG tablet TAKE 1 TABLET BY MOUTH EVERY DAY 90 tablet 0   carvedilol (COREG) 12.5 MG tablet TAKE 1 TABLET (12.5 MG TOTAL) BY MOUTH 2 (TWO)  TIMES DAILY WITH A MEAL. 180 tablet 0   dexamethasone (DECADRON) 4 MG tablet Take 1 tablet three times a day with food. 60 tablet 0   diclofenac sodium (VOLTAREN) 1 % GEL Apply 4 g topically 4 (four) times daily. 4 Tube 3   fenofibrate (TRICOR) 48 MG tablet TAKE 1 TABLET BY MOUTH EVERY DAY 90 tablet 3   glucose blood (ONETOUCH VERIO) test strip Use to test 3-4 times daily. 300 strip 4   KLOR-CON M20 20 MEQ tablet TAKE 1 TABLET BY MOUTH TWICE A DAY FOR 2 DAYS THEN TAKE 1 TABLET BY MOUTH EVERY DAY 90 tablet 1   levothyroxine (SYNTHROID) 112 MCG tablet Take 1 tablet (112 mcg total) by mouth daily before breakfast. 90 tablet 2   lidocaine-prilocaine (EMLA) cream Apply 1 application topically as needed. 30 g 2   Loperamide HCl (IMODIUM PO) Take by mouth as needed.     losartan (COZAAR) 100 MG tablet Take 1 tablet (100 mg total) by mouth daily. 90 tablet 2   nicotine (NICODERM CQ - DOSED IN MG/24 HOURS) 14 mg/24hr patch PLACE 1 PATCH ONTO THE SKIN DAILY. 28 patch 0   nitroGLYCERIN (NITROSTAT) 0.4 MG SL tablet Place 1 tablet (0.4 mg total) under the tongue every 5 (five) minutes x 3 doses as needed for chest pain (Max 3 doses within 15 min. Call 911). 10 tablet 0   oxyCODONE-acetaminophen (PERCOCET/ROXICET) 5-325 MG tablet Take 1 tablet by mouth every 8 (eight) hours as needed for severe pain. 20 tablet 0   pantoprazole (PROTONIX) 40 MG tablet TAKE 1 TABLET BY MOUTH EVERY DAY 90 tablet 1   prochlorperazine (COMPAZINE) 10 MG tablet Take 1 tablet (10 mg total) by mouth every 6 (six) hours as needed for nausea or vomiting. 30 tablet 1   No current facility-administered medications for this visit.    Allergies:   Seasonal ic [cholestatin]   Social History:  The patient  reports that she has been smoking cigarettes. She has a 16.50 pack-year smoking history. She has never used smokeless tobacco. She reports that she does not drink alcohol and does not use drugs.   Family History:  The patient's family  history includes Cirrhosis in her brother; Heart attack in her mother; Heart disease in her father and mother.   ROS:  Please see the history of present illness.   Otherwise, review of systems is positive for none.   All other systems are reviewed and negative.   PHYSICAL EXAM: VS:  BP (!) 130/54   Pulse 67   Ht 5\' 2"  (1.575 m)   Wt 110 lb 6.4 oz (50.1 kg)   LMP  (LMP Unknown)   SpO2 99%   BMI 20.19 kg/m  , BMI Body mass index is 20.19 kg/m. GEN: Well nourished, well developed, in no acute distress  HEENT: normal  Neck: no JVD, carotid bruits, or masses Cardiac: Irregular; no murmurs, rubs, or gallops,no edema  Respiratory:  clear to auscultation bilaterally, normal work of breathing GI: soft, nontender, nondistended, + BS MS: no deformity or atrophy  Skin: warm and dry,  device site well healed Neuro:  Strength and sensation are intact Psych: euthymic mood, full affect  EKG:  EKG is ordered today. Personal review of the ekg ordered shows atrial fibrillation, rate 67, intermittent ventricular paced  Personal review of the device interrogation today. Results in [place]: 03/16/2021: ALT 29; BUN 20; Creatinine 0.75; Hemoglobin 10.1; Platelet Count 128; Potassium 4.4; Sodium 142; TSH 0.217    Lipid Panel     Component Value Date/Time   CHOL 116 01/17/2021 1018   TRIG 49.0 01/17/2021 1018   HDL 63.70 01/17/2021 1018   CHOLHDL 2 01/17/2021 1018   VLDL 9.8 01/17/2021 1018   LDLCALC 43 01/17/2021 1018     Wt Readings from Last 3 Encounters:  03/16/21 110 lb 6.4 oz (50.1 kg)  03/16/21 109 lb 6.4 oz (49.6 kg)  03/15/21 109 lb 3.2 oz (49.5 kg)      Other studies Reviewed: Additional studies/ records that were reviewed today include:  TTE 12/25/15 - Left ventricle: The cavity size was normal. Systolic function was   normal. The estimated ejection fraction was in the range of 55%   to 60%. Wall motion was normal; there were no regional wall   motion  abnormalities. The study is not technically sufficient to   allow evaluation of LV diastolic function. - Aortic valve: Transvalvular velocity was within the normal range.   There was no stenosis. There was mild to moderate regurgitation. - Mitral valve: There was no regurgitation. - Left atrium: The atrium was severely dilated. - Right ventricle: The cavity size was normal. Wall thickness was   normal. Systolic function was normal. - Right atrium: The atrium was severely dilated. - Tricuspid valve: There was mild regurgitation. - Pulmonary arteries: Systolic pressure was within the normal   range. PA peak pressure: 23 mm Hg (S).   Cardiac cath 12/26/15  Prox RCA to Dist RCA lesion, 30% stenosed. Prox LAD lesion, 40% stenosed. Mid LAD to Dist LAD lesion, 45% stenosed. Ost Cx to Prox Cx lesion, 10% stenosed. The lesion was previously treated with a drug-eluting stent greater than two years ago. The left ventricular systolic function is normal.   ASSESSMENT AND PLAN:  1.  Tachybradycardia syndrome: Status post Catering manager.  Device functioning appropriately.  No changes at this time.  2.  Permanent atrial fibrillation: Currently on Xarelto.  CHA2DS2-VASc of 5.  She remains in atrial fibrillation.  Minimal symptoms.  No changes.  3.  Hypertension: Currently well controlled  4.  Coronary artery disease: Stable without angina  Current medicines are reviewed at length with the patient today.   The patient does not have concerns regarding her medicines.  The following changes were made today: none  Labs/ tests ordered today include:  Orders Placed This Encounter  Procedures   EKG 12-Lead      Disposition:   FU 12 months  Signed, Lenaya Pietsch Meredith Leeds, MD  03/16/2021 4:57 PM     [address] [place] [id number] [phone] (office) [phone] (fax)

## 2021-03-16 NOTE — Telephone Encounter (Signed)
Fax sent to [facility]. Fax confirmation received. Spoke with Corbin Ade to advise sending fax.

## 2021-03-16 NOTE — Telephone Encounter (Signed)
New message:      Corbin Ade from [facility] radiology calling stating that they sent over a form to be filled out and fax to [phone]. Patient is in the office now to start her treatment they moved it up.

## 2021-03-17 ENCOUNTER — Other Ambulatory Visit: Payer: Self-pay

## 2021-03-17 ENCOUNTER — Encounter: Payer: Self-pay | Admitting: Radiation Oncology

## 2021-03-17 ENCOUNTER — Encounter: Payer: Self-pay | Admitting: Internal Medicine

## 2021-03-17 ENCOUNTER — Ambulatory Visit
Admission: RE | Admit: 2021-03-17 | Discharge: 2021-03-17 | Disposition: A | Discharge status: Home / Self Care | Payer: Medicare Other | Source: Ambulatory Visit | Attending: Radiation Oncology | Admitting: Radiation Oncology

## 2021-03-17 DIAGNOSIS — C7931 Secondary malignant neoplasm of brain: Secondary | ICD-10-CM | POA: Diagnosis not present

## 2021-03-17 DIAGNOSIS — Z510 Encounter for antineoplastic radiation therapy: Secondary | ICD-10-CM | POA: Diagnosis not present

## 2021-03-17 DIAGNOSIS — C3411 Malignant neoplasm of upper lobe, right bronchus or lung: Secondary | ICD-10-CM | POA: Diagnosis not present

## 2021-03-20 ENCOUNTER — Ambulatory Visit
Admission: RE | Admit: 2021-03-20 | Discharge: 2021-03-20 | Disposition: A | Discharge status: Home / Self Care | Payer: Medicare Other | Source: Ambulatory Visit | Attending: Radiation Oncology | Admitting: Radiation Oncology

## 2021-03-20 ENCOUNTER — Other Ambulatory Visit: Payer: Self-pay | Admitting: Radiation Oncology

## 2021-03-20 DIAGNOSIS — C7931 Secondary malignant neoplasm of brain: Secondary | ICD-10-CM | POA: Diagnosis not present

## 2021-03-20 DIAGNOSIS — C3411 Malignant neoplasm of upper lobe, right bronchus or lung: Secondary | ICD-10-CM | POA: Diagnosis not present

## 2021-03-20 DIAGNOSIS — Z510 Encounter for antineoplastic radiation therapy: Secondary | ICD-10-CM | POA: Diagnosis not present

## 2021-03-21 ENCOUNTER — Other Ambulatory Visit: Payer: Self-pay

## 2021-03-21 ENCOUNTER — Ambulatory Visit
Admission: RE | Admit: 2021-03-21 | Discharge: 2021-03-21 | Disposition: A | Discharge status: Home / Self Care | Payer: Medicare Other | Source: Ambulatory Visit | Attending: Radiation Oncology | Admitting: Radiation Oncology

## 2021-03-21 DIAGNOSIS — Z510 Encounter for antineoplastic radiation therapy: Secondary | ICD-10-CM | POA: Diagnosis not present

## 2021-03-21 DIAGNOSIS — C7931 Secondary malignant neoplasm of brain: Secondary | ICD-10-CM | POA: Diagnosis not present

## 2021-03-21 DIAGNOSIS — C3411 Malignant neoplasm of upper lobe, right bronchus or lung: Secondary | ICD-10-CM | POA: Diagnosis not present

## 2021-03-22 ENCOUNTER — Telehealth: Payer: Self-pay | Admitting: Pharmacist

## 2021-03-22 ENCOUNTER — Ambulatory Visit
Admission: RE | Admit: 2021-03-22 | Discharge: 2021-03-22 | Disposition: A | Discharge status: Home / Self Care | Payer: Medicare Other | Source: Ambulatory Visit | Attending: Radiation Oncology | Admitting: Radiation Oncology

## 2021-03-22 DIAGNOSIS — C7931 Secondary malignant neoplasm of brain: Secondary | ICD-10-CM | POA: Diagnosis not present

## 2021-03-22 DIAGNOSIS — Z510 Encounter for antineoplastic radiation therapy: Secondary | ICD-10-CM | POA: Diagnosis not present

## 2021-03-22 DIAGNOSIS — C3491 Malignant neoplasm of unspecified part of right bronchus or lung: Secondary | ICD-10-CM | POA: Diagnosis not present

## 2021-03-22 DIAGNOSIS — C3411 Malignant neoplasm of upper lobe, right bronchus or lung: Secondary | ICD-10-CM | POA: Diagnosis not present

## 2021-03-22 NOTE — Chronic Care Management (AMB) (Signed)
Chronic Care Management Pharmacy Assistant   Name: [name]  MRN: [id number] DOB: 07-Jul-1935  Reason for Encounter: Disease State/ Hypertension Assessment Call.   Conditions to be addressed/monitored: HTN  Recent office visits:  02/01/21 Robin Mitts MD (Family Medicine) - patient presented to clinic for diarrhea and other issues. No medication changes. Follow up as needed.   01/17/21 Robin [name] MD (PCP) - seen for routine general medical examination at a health care facility and other issues. Referrals for Ophthalmology and Neuropsychology placed. Patient started on Nicotine patch. Changed losartan from 2 tablets daily to 1 tablet daily. Follow up in 5-6 months.   Recent consult visits:  03/16/21 Robin Lai MD (Cardiology) - patient presented to clinic for tachy- brady syndrome. No medication changes. Follow up in 12 months.   03/16/21 Robin Heilingoetter PA-C (Oncology) -  patient presented to clinic for small cell lung cancer. Discontinued Apixaban 2.5mg . follow up in 3 weeks.   02/16/21 Robin Bears MD (Oncology) - patient presented to clinic for small cell lung cancer. No medication changes. Follow up in 4 weeks.   01/19/21 Robin Bears MD (Oncology) - patient presented to clinic for small cell lung cancer. Discontinued doxepin 3mg . Follow up in 4 weeks.   Hospital visits:  None in previous 6 months  Medications: Outpatient Encounter Medications as of 03/22/2021  Medication Sig Note   acetaminophen (TYLENOL) 500 MG tablet Take 500 mg by mouth every 6 (six) hours as needed for headache.    albuterol (VENTOLIN HFA) 108 (90 Base) MCG/ACT inhaler TAKE 2 PUFFS BY MOUTH EVERY 6 HOURS AS NEEDED FOR WHEEZE OR SHORTNESS OF BREATH    atorvastatin (LIPITOR) 10 MG tablet TAKE 1 TABLET BY MOUTH EVERY DAY    carvedilol (COREG) 12.5 MG tablet TAKE 1 TABLET (12.5 MG TOTAL) BY MOUTH 2 (TWO) TIMES DAILY WITH A MEAL.    dexamethasone (DECADRON) 4 MG tablet Take 1 tablet three times  a day with food. 03/20/2021: Take 4 mg TID, Then taper to 4 mg BID on April 03, 2021; Then, taper to 4 mg daily on July 18; Then, taper to 2 mg daily on Aug 1; Then, taper to 2 mg QOD on Aug 8th. Last dose - Aug 20.   diclofenac sodium (VOLTAREN) 1 % GEL Apply 4 g topically 4 (four) times daily.    fenofibrate (TRICOR) 48 MG tablet TAKE 1 TABLET BY MOUTH EVERY DAY    glucose blood (ONETOUCH VERIO) test strip Use to test 3-4 times daily.    KLOR-CON M20 20 MEQ tablet TAKE 1 TABLET BY MOUTH TWICE A DAY FOR 2 DAYS THEN TAKE 1 TABLET BY MOUTH EVERY DAY    levothyroxine (SYNTHROID) 112 MCG tablet Take 1 tablet (112 mcg total) by mouth daily before breakfast.    lidocaine-prilocaine (EMLA) cream Apply 1 application topically as needed.    Loperamide HCl (IMODIUM PO) Take by mouth as needed. 01/19/2021: Takes liquid imodium and states it works better than the tablet.   losartan (COZAAR) 100 MG tablet Take 1 tablet (100 mg total) by mouth daily.    nicotine (NICODERM CQ - DOSED IN MG/24 HOURS) 14 mg/24hr patch PLACE 1 PATCH ONTO THE SKIN DAILY.    nitroGLYCERIN (NITROSTAT) 0.4 MG SL tablet Place 1 tablet (0.4 mg total) under the tongue every 5 (five) minutes x 3 doses as needed for chest pain (Max 3 doses within 15 min. Call 911).    oxyCODONE-acetaminophen (PERCOCET/ROXICET) 5-325 MG tablet Take 1 tablet by  mouth every 8 (eight) hours as needed for severe pain. 02/18/2020: Not started   pantoprazole (PROTONIX) 40 MG tablet TAKE 1 TABLET BY MOUTH EVERY DAY    prochlorperazine (COMPAZINE) 10 MG tablet Take 1 tablet (10 mg total) by mouth every 6 (six) hours as needed for nausea or vomiting.    No facility-administered encounter medications on file as of 03/22/2021.    Reviewed chart prior to disease state call. Spoke with patient regarding BP  Recent Office Vitals: BP Readings from Last 3 Encounters:  03/16/21 (!) 130/54  03/16/21 (!) 153/53  03/15/21 (!) 112/42   Pulse Readings from Last 3  Encounters:  03/16/21 67  03/16/21 61  03/15/21 62    Wt Readings from Last 3 Encounters:  03/16/21 110 lb 6.4 oz (50.1 kg)  03/16/21 109 lb 6.4 oz (49.6 kg)  03/15/21 109 lb 3.2 oz (49.5 kg)     Kidney Function Lab Results  Component Value Date/Time   CREATININE 0.75 03/16/2021 09:49 AM   CREATININE 0.90 02/16/2021 10:09 AM   GFR 117.94 12/15/2019 11:23 AM   GFRNONAA >60 03/16/2021 09:49 AM   GFRAA >60 06/27/2020 09:24 AM    BMP Latest Ref Rng & Units 03/16/2021 02/16/2021 01/19/2021  Glucose 70 - 99 mg/dL 137(H) 90 100(H)  BUN 8 - 23 mg/dL 20 26(H) 19  Creatinine 0.44 - 1.00 mg/dL 0.75 0.90 0.77  BUN/Creat Ratio 12 - 28 - - -  Sodium 135 - 145 mmol/L 142 143 145  Potassium 3.5 - 5.1 mmol/L 4.4 4.6 4.5  Chloride 98 - 111 mmol/L 111 110 110  CO2 22 - 32 mmol/L 25 26 25   Calcium 8.9 - 10.3 mg/dL 9.6 9.4 9.0    Current antihypertensive regimen:  Carvedilol 12.5mg   -  take twice daily with a meal.  Losartan 100mg  - take once daily.  How often are you checking your Blood Pressure? Patient and daughters have not been checking recently. Patient is in advanced stage lung cancer. Current home BP readings: None to report. What recent interventions/DTPs have been made by any provider to improve Blood Pressure control since last CPP Visit: Losartan was changed from 2 tablets daily to 1 tablet daily.  Any recent hospitalizations or ED visits since last visit with CPP? No  Adherence Review: Is the patient currently on ACE/ARB medication? Yes Does the patient have >5 day gap between last estimated fill dates? No  Notes:  Spoke with patients daughter [name] and reviewed medications as listed. [name] stated that patient is taking all medications as reported and no issues at this time. Patient does get a little dizzy but its from where patient has a tumor on her brainstem and it is causung her balance issues. Blood pressure has not been getting checked or wrote down. I encouraged [name] to  try and check patients blood pressure and write down at least once a week if they can. [name] was agreeable and states patient has a blood pressure cuff. [name] stated that patient is currently on sterpid to try and help improve her appetite. It has helped and patient has been eating more lately. She likes the [employer] and croissants. Patient is drinking boost shakes throughout the day. She usually has about half a sandwich if different kinds for lunch and usually has a meat and vegetable for dinner. Patient drinks about 8 of the kid sized waters a day. If patient eats out its after one of her chemo treatments when she gets hungry.  Patient is able to get around her house exceot she did have a little fall yesterday when getting out of bed to use the restroom and her foot got caught in the sheet.patient is pacing herself more now and has a potty in her bedroom beside her bed now. [name] thanked me for my call.   Star Rating Drugs:  Atorvastatin 10mg  - last filled on 03/16/21 90DS at [facility]  Losartan 100mg  - last filled on 01/17/21 90DS at [facility] Pharmacist Assistant [phone]

## 2021-03-23 ENCOUNTER — Ambulatory Visit
Admission: RE | Admit: 2021-03-23 | Discharge: 2021-03-23 | Disposition: A | Discharge status: Home / Self Care | Payer: Medicare Other | Source: Ambulatory Visit | Attending: Radiation Oncology | Admitting: Radiation Oncology

## 2021-03-23 ENCOUNTER — Other Ambulatory Visit: Payer: Self-pay

## 2021-03-23 DIAGNOSIS — C7931 Secondary malignant neoplasm of brain: Secondary | ICD-10-CM | POA: Diagnosis not present

## 2021-03-23 DIAGNOSIS — Z510 Encounter for antineoplastic radiation therapy: Secondary | ICD-10-CM | POA: Diagnosis not present

## 2021-03-23 DIAGNOSIS — C3411 Malignant neoplasm of upper lobe, right bronchus or lung: Secondary | ICD-10-CM | POA: Diagnosis not present

## 2021-03-24 ENCOUNTER — Ambulatory Visit
Admission: RE | Admit: 2021-03-24 | Discharge: 2021-03-24 | Disposition: A | Discharge status: Home / Self Care | Payer: Medicare Other | Source: Ambulatory Visit | Attending: Radiation Oncology | Admitting: Radiation Oncology

## 2021-03-24 ENCOUNTER — Other Ambulatory Visit: Payer: Self-pay | Admitting: Radiation Oncology

## 2021-03-24 DIAGNOSIS — Z510 Encounter for antineoplastic radiation therapy: Secondary | ICD-10-CM | POA: Diagnosis not present

## 2021-03-24 DIAGNOSIS — C3411 Malignant neoplasm of upper lobe, right bronchus or lung: Secondary | ICD-10-CM | POA: Diagnosis not present

## 2021-03-24 DIAGNOSIS — C7931 Secondary malignant neoplasm of brain: Secondary | ICD-10-CM | POA: Diagnosis not present

## 2021-03-24 MED ORDER — FLUCONAZOLE 100 MG PO TABS: ORAL_TABLET | ORAL | 0 refills | Status: DC

## 2021-03-27 ENCOUNTER — Other Ambulatory Visit: Payer: Self-pay

## 2021-03-27 ENCOUNTER — Other Ambulatory Visit: Payer: Self-pay | Admitting: Interventional Cardiology

## 2021-03-27 ENCOUNTER — Ambulatory Visit
Admission: RE | Admit: 2021-03-27 | Discharge: 2021-03-27 | Disposition: A | Discharge status: Home / Self Care | Payer: Medicare Other | Source: Ambulatory Visit | Attending: Radiation Oncology | Admitting: Radiation Oncology

## 2021-03-27 DIAGNOSIS — Z510 Encounter for antineoplastic radiation therapy: Secondary | ICD-10-CM | POA: Diagnosis not present

## 2021-03-27 DIAGNOSIS — C3411 Malignant neoplasm of upper lobe, right bronchus or lung: Secondary | ICD-10-CM | POA: Diagnosis not present

## 2021-03-27 DIAGNOSIS — C7931 Secondary malignant neoplasm of brain: Secondary | ICD-10-CM | POA: Diagnosis not present

## 2021-03-27 NOTE — Telephone Encounter (Addendum)
Eliquis 2.5mg  refill request received. Patient is 87 years old, weight-50.1kg, Crea-0.75 on 03/16/2021, Diagnosis-Afib, and last seen by Dr. Curt Bears on 03/16/2021. Dose is appropriate based on dosing criteria. The med is on hold so will not send until resumed to avoid the pt taking when not supposed to do so.   Per 03/22/21 PCP Pharmacy note: 03/16/21 Cassandra Heilingoetter PA-C (Oncology) -  patient presented to clinic for small cell lung cancer. Discontinued Apixaban 2.5mg . follow up in 3 weeks

## 2021-03-28 ENCOUNTER — Ambulatory Visit
Admission: RE | Admit: 2021-03-28 | Discharge: 2021-03-28 | Disposition: A | Discharge status: Home / Self Care | Payer: Medicare Other | Source: Ambulatory Visit | Attending: Radiation Oncology | Admitting: Radiation Oncology

## 2021-03-28 DIAGNOSIS — C7931 Secondary malignant neoplasm of brain: Secondary | ICD-10-CM | POA: Diagnosis not present

## 2021-03-28 DIAGNOSIS — Z510 Encounter for antineoplastic radiation therapy: Secondary | ICD-10-CM | POA: Diagnosis not present

## 2021-03-28 DIAGNOSIS — C3411 Malignant neoplasm of upper lobe, right bronchus or lung: Secondary | ICD-10-CM | POA: Diagnosis not present

## 2021-03-29 ENCOUNTER — Other Ambulatory Visit: Payer: Self-pay

## 2021-03-29 ENCOUNTER — Other Ambulatory Visit: Payer: Self-pay | Admitting: Family Medicine

## 2021-03-29 ENCOUNTER — Encounter: Payer: Self-pay | Admitting: Radiation Oncology

## 2021-03-29 ENCOUNTER — Ambulatory Visit
Admission: RE | Admit: 2021-03-29 | Discharge: 2021-03-29 | Disposition: A | Discharge status: Home / Self Care | Payer: Medicare Other | Source: Ambulatory Visit | Attending: Radiation Oncology | Admitting: Radiation Oncology

## 2021-03-29 DIAGNOSIS — C3491 Malignant neoplasm of unspecified part of right bronchus or lung: Secondary | ICD-10-CM | POA: Diagnosis not present

## 2021-03-29 DIAGNOSIS — K219 Gastro-esophageal reflux disease without esophagitis: Secondary | ICD-10-CM

## 2021-03-29 DIAGNOSIS — Z510 Encounter for antineoplastic radiation therapy: Secondary | ICD-10-CM | POA: Diagnosis not present

## 2021-03-29 DIAGNOSIS — C7931 Secondary malignant neoplasm of brain: Secondary | ICD-10-CM | POA: Diagnosis not present

## 2021-03-29 DIAGNOSIS — C3411 Malignant neoplasm of upper lobe, right bronchus or lung: Secondary | ICD-10-CM | POA: Diagnosis not present

## 2021-03-31 ENCOUNTER — Other Ambulatory Visit (HOSPITAL_COMMUNITY): Payer: Self-pay

## 2021-03-31 ENCOUNTER — Telehealth: Payer: Self-pay | Admitting: Interventional Cardiology

## 2021-03-31 ENCOUNTER — Encounter: Payer: Self-pay | Admitting: Internal Medicine

## 2021-03-31 MED FILL — Carvedilol Tab 12.5 MG: ORAL | 90 days supply | Qty: 180 | Fill #0 | Status: AC

## 2021-03-31 NOTE — Telephone Encounter (Signed)
*  STAT* If patient is at the pharmacy, call can be transferred to refill team.   1. Which medications need to be refilled? (please list name of each medication and dose if known) carvedilol,   2. Which pharmacy/location (including street and city if local pharmacy) is medication to be sent to? [facility] new pharmacy    3. Do they need a 30 day or 90 day supply? 90  Patient is out have not had since [place]

## 2021-03-31 NOTE — Telephone Encounter (Signed)
Returned call to pt, spoke with Daughter, [name], [place] on file, she states that they just picked up the Carvedilol from [facility] and they were good. She was appreciative of the call back.

## 2021-04-04 ENCOUNTER — Other Ambulatory Visit: Payer: Self-pay | Admitting: Radiation Oncology

## 2021-04-04 ENCOUNTER — Other Ambulatory Visit (HOSPITAL_COMMUNITY): Payer: Self-pay

## 2021-04-04 DIAGNOSIS — C7931 Secondary malignant neoplasm of brain: Secondary | ICD-10-CM

## 2021-04-05 ENCOUNTER — Other Ambulatory Visit: Payer: Self-pay | Admitting: Radiation Oncology

## 2021-04-05 ENCOUNTER — Other Ambulatory Visit (HOSPITAL_COMMUNITY): Payer: Self-pay

## 2021-04-05 ENCOUNTER — Other Ambulatory Visit: Payer: Self-pay | Admitting: *Deleted

## 2021-04-05 DIAGNOSIS — C7931 Secondary malignant neoplasm of brain: Secondary | ICD-10-CM

## 2021-04-05 DIAGNOSIS — C3491 Malignant neoplasm of unspecified part of right bronchus or lung: Secondary | ICD-10-CM

## 2021-04-06 ENCOUNTER — Telehealth: Payer: Self-pay | Admitting: Internal Medicine

## 2021-04-06 ENCOUNTER — Telehealth: Payer: Self-pay | Admitting: *Deleted

## 2021-04-06 ENCOUNTER — Inpatient Hospital Stay: Payer: Medicare Other | Admitting: Internal Medicine

## 2021-04-06 ENCOUNTER — Inpatient Hospital Stay: Payer: Medicare Other

## 2021-04-06 NOTE — Telephone Encounter (Signed)
Message to scheduler to arrange follow up with Dr Julien Nordmann or Rubin Payor

## 2021-04-06 NOTE — Telephone Encounter (Signed)
Called pt to sch appt. Was informed by the person who answered the phone that pt was not there, but that she would have the pt call back to sch appt.

## 2021-04-06 NOTE — Telephone Encounter (Signed)
Spoke with son in law about today's appts. States patient thought she wasn't supposed to take chemo while she is still on the steroids (taper from Dr Isidore Moos to end 8/20 according to rx). States it was cancelled previously due to steroids. Wants to know if we should reschedule? Next appt for chemo is 05/04/21

## 2021-04-07 ENCOUNTER — Other Ambulatory Visit (HOSPITAL_COMMUNITY): Payer: Self-pay

## 2021-04-07 ENCOUNTER — Encounter: Payer: Self-pay | Admitting: Internal Medicine

## 2021-04-07 ENCOUNTER — Other Ambulatory Visit: Payer: Self-pay | Admitting: Radiation Oncology

## 2021-04-07 DIAGNOSIS — C7931 Secondary malignant neoplasm of brain: Secondary | ICD-10-CM

## 2021-04-07 MED ORDER — DEXAMETHASONE 4 MG PO TABS
ORAL_TABLET | ORAL | 0 refills | Status: DC
Start: 2021-04-07 — End: 2021-06-01
  Filled 2021-04-07: qty 60, 44d supply, fill #0

## 2021-04-07 MED ORDER — DEXAMETHASONE 4 MG PO TABS
ORAL_TABLET | ORAL | 0 refills | Status: DC
  Filled 2021-04-07: qty 60, fill #0

## 2021-04-15 ENCOUNTER — Encounter: Payer: Self-pay | Admitting: Emergency Medicine

## 2021-04-15 ENCOUNTER — Ambulatory Visit
Admission: EM | Admit: 2021-04-15 | Discharge: 2021-04-15 | Disposition: A | Discharge status: Home / Self Care | Payer: Medicare Other | Attending: Family Medicine | Admitting: Family Medicine

## 2021-04-15 ENCOUNTER — Other Ambulatory Visit: Payer: Self-pay

## 2021-04-15 DIAGNOSIS — L309 Dermatitis, unspecified: Secondary | ICD-10-CM | POA: Diagnosis not present

## 2021-04-15 DIAGNOSIS — M7989 Other specified soft tissue disorders: Secondary | ICD-10-CM

## 2021-04-15 DIAGNOSIS — E11628 Type 2 diabetes mellitus with other skin complications: Secondary | ICD-10-CM

## 2021-04-15 LAB — POCT FASTING CBG KUC MANUAL ENTRY: POCT Glucose (KUC): 258 mg/dL — AB (ref 70–99)

## 2021-04-15 MED ORDER — TRIAMCINOLONE ACETONIDE 0.025 % EX OINT
1.0000 "application " | TOPICAL_OINTMENT | Freq: Two times a day (BID) | CUTANEOUS | 0 refills | Status: DC
  Filled 2021-04-15: qty 90, 30d supply, fill #0

## 2021-04-15 NOTE — Discharge Instructions (Addendum)
For swelling wear compression socks and keep feet elevated to improve swelling. Keep an eye on blood sugar if readings are consistently greater than 200. Blood sugar is elevated today, please follow-up with [name] if reading remain in 200 she may need to adjust steriods. Apply triamcinolone cream twice daily as needed to scalp for dermatitis secondary to radiation therapy.  This will help relieve the itching and rash.

## 2021-04-15 NOTE — ED Triage Notes (Signed)
Bilateral foot swelling started 2 days ago. PItting edema present in both feet, slightly worse in right foot.   Denies chest pain / shortness of breath.

## 2021-04-15 NOTE — ED Provider Notes (Signed)
[facility]    CSN: [id number] Arrival date & time: 04/15/21  0935      History   Chief Complaint Chief Complaint  Patient presents with   Leg Swelling    Bilateral foot swelling    HPI [name] is a 86 y.o. female.   HPI Patient presents today accompanied by her daughter who is concerned patient has had some bilateral lower foot swelling and leg swelling.  Patient has an active history of CAD, diabetes, COPD, hypertension is actively receiving treatment for brain cancer.  Daughter reports she has elevated lower extremities which has improved the swelling however daughter reports patient has had recurrent swelling over the last couple days.  Denies any chest pain or shortness of breath.  Patient is currently on an extended dose of prednisone for treatment related to her cancer.  Patient is also concerned regarding rash on the scalp which has become pruritic and she has abrasive wounds from scratching.  Patient reports is related to her recent radiation treatment.  Past Medical History:  Diagnosis Date   CAD (coronary artery disease), native coronary artery    PTCA of distal right coronary artery 1997 Cypher stents to circumflex 2005 Cardiac cath in 2011 with patent stent to circumflex and moderate disease elsewhere treated medically    Cancer ([place])    Cardiac pacemaker in situ 12/24/2015   Original implant reportedly in 1991 for tachybradycardia syndrome, generator change in 1999, 2004 and evidently again in 2016 in [place]    COPD (chronic obstructive pulmonary disease) ([place])    Diabetes mellitus without complication ([place])    Hypertension    Hypothyroidism 03/23/2010   Pacemaker    Paroxysmal atrial fibrillation ([place]) 03/23/2010   CHA2DS2VASC score 5     Patient Active Problem List   Diagnosis Date Noted   Metastatic cancer to brain ([place]) 03/16/2021   Epigastric abdominal pain 04/27/2020   Port-A-Cath in place 04/11/2020   Small cell lung cancer, right  ([place]) 01/28/2020   Encounter for antineoplastic chemotherapy 01/28/2020   Encounter for antineoplastic immunotherapy 01/28/2020   Goals of care, counseling/discussion 01/28/2020   Near syncope 01/05/2020   Mediastinal lymphadenopathy 01/01/2020   Multiple lung nodules on CT 01/01/2020   Atherosclerosis of aorta ([place]) 12/28/2019   Chronic diarrhea 12/15/2019   Hyperlipidemia associated with type 2 diabetes mellitus ([place]) 06/10/2019   Chronic rhinitis 02/28/2018   Tobacco use disorder 05/10/2017   Hypertension with heart disease 05/10/2017   Chest pain 03/10/2017   Acute thoracic back pain    Cardiac pacemaker in situ 12/24/2015   History of Graves' disease 12/24/2015   Current use of long term anticoagulation 12/24/2015   Type 2 diabetes mellitus with other specified complication ([place])    Hypothyroidism 03/23/2010   Paroxysmal atrial fibrillation ([place]) 03/23/2010   COPD (chronic obstructive pulmonary disease) ([place]) 03/23/2010   CAD (coronary artery disease), native coronary artery    GERD     Past Surgical History:  Procedure Laterality Date   BIOPSY  01/08/2020   Procedure: BIOPSY;  Surgeon: Garner Nash, DO;  Location: [facility];  Service: Pulmonary;;   BRONCHIAL BRUSHINGS  01/08/2020   Procedure: BRONCHIAL BRUSHINGS;  Surgeon: Garner Nash, DO;  Location: [facility];  Service: Pulmonary;;   BRONCHIAL WASHINGS  01/08/2020   Procedure: BRONCHIAL WASHINGS;  Surgeon: Garner Nash, DO;  Location: [facility];  Service: Pulmonary;;   CARDIAC CATHETERIZATION N/A 12/26/2015   Procedure: Left Heart Cath and Coronary Angiography;  Surgeon: Peter M [name], MD;  Location: [facility];  Service: Cardiovascular;  Laterality: N/A;   CARDIOVERSION  05/2015   CHOLECYSTECTOMY  2012   CORONARY ANGIOPLASTY WITH STENT PLACEMENT  1990   ENDOBRONCHIAL ULTRASOUND  01/08/2020   Procedure: ENDOBRONCHIAL ULTRASOUND;  Surgeon: Garner Nash, DO;  Location: [facility];  Service:  Pulmonary;;   FINE NEEDLE ASPIRATION BIOPSY  01/08/2020   Procedure: FINE NEEDLE ASPIRATION BIOPSY;  Surgeon: Garner Nash, DO;  Location: [facility];  Service: Pulmonary;;   HERNIA REPAIR     IR IMAGING GUIDED PORT INSERTION  02/18/2020   PACEMAKER GENERATOR CHANGE  0258,5277, 2016   PACEMAKER INSERTION  1991   VIDEO BRONCHOSCOPY WITH ENDOBRONCHIAL ULTRASOUND N/A 01/08/2020   Procedure: VIDEO BRONCHOSCOPY;  Surgeon: Garner Nash, DO;  Location: [facility];  Service: Pulmonary;  Laterality: N/A;    OB History   No obstetric history on file.      Home Medications    Prior to Admission medications   Medication Sig Start Date End Date Taking? Authorizing Provider  triamcinolone (KENALOG) 0.025 % ointment Apply 1 application topically 2 (two) times daily. 04/15/21  Yes Scot Jun, FNP  acetaminophen (TYLENOL) 500 MG tablet Take 500 mg by mouth every 6 (six) hours as needed for headache.    [provider]  albuterol (VENTOLIN HFA) 108 (90 Base) MCG/ACT inhaler TAKE 2 PUFFS BY MOUTH EVERY 6 HOURS AS NEEDED FOR WHEEZE OR SHORTNESS OF BREATH 05/25/20   [name], [name], MD  atorvastatin (LIPITOR) 10 MG tablet TAKE 1 TABLET BY MOUTH EVERY DAY 12/12/20   [name], MD  carvedilol (COREG) 12.5 MG tablet Take 1 tablet (12.5 mg total) by mouth 2 (two) times daily with a meal. 03/29/21   [name], [name], MD  dexamethasone (DECADRON) 4 MG tablet To start, Take 1 tab twice daily; Then, taper to 1 tab daily on July 18; Then, taper to 1/2 tab daily on Aug 1; Then, taper to 1/2 tab every other day on Aug 8th. Take last dose Aug 20. 04/07/21   [name], MD  diclofenac sodium (VOLTAREN) 1 % GEL Apply 4 g topically 4 (four) times daily. 11/12/18   [name], [name], MD  fenofibrate (TRICOR) 48 MG tablet TAKE 1 TABLET BY MOUTH EVERY DAY 11/21/20   [name], [name], MD  fluconazole (DIFLUCAN) 100 MG tablet Take 2 tablets by mouth today, then 1 tablet daily x 20 more days. Hold  Atorvastatin while on this. 03/24/21   [name], MD  glucose blood [facility] VERIO) test strip Use to test 3-4 times daily. 09/08/19   [name], [name], MD  KLOR-CON M20 20 MEQ tablet TAKE 1 TABLET BY MOUTH TWICE A DAY FOR 2 DAYS THEN TAKE 1 TABLET BY MOUTH EVERY DAY 03/10/21   [name], [name], MD  levothyroxine (SYNTHROID) 112 MCG tablet Take 1 tablet (112 mcg total) by mouth daily before breakfast. 11/07/20   [name], [name], MD  lidocaine-prilocaine (EMLA) cream Apply 1 application topically as needed. 04/11/20   Heilingoetter, Cassandra L, PA-C  Loperamide HCl (IMODIUM PO) Take by mouth as needed.    [provider]  losartan (COZAAR) 100 MG tablet Take 1 tablet (100 mg total) by mouth daily. 01/17/21   [name], [name], MD  nicotine (NICODERM CQ - DOSED IN MG/24 HOURS) 14 mg/24hr patch PLACE 1 PATCH ONTO THE SKIN DAILY. 02/20/21   [name], [name], MD  nitroGLYCERIN (NITROSTAT) 0.4 MG  SL tablet Place 1 tablet (0.4 mg total) under the tongue every 5 (five) minutes x 3 doses as needed for chest pain (Max 3 doses within 15 min. Call 911). 05/26/20   Sherran Needs, NP  oxyCODONE-acetaminophen (PERCOCET/ROXICET) 5-325 MG tablet Take 1 tablet by mouth every 8 (eight) hours as needed for severe pain. 02/10/20   Curt Bears, MD  pantoprazole (PROTONIX) 40 MG tablet TAKE 1 TABLET BY MOUTH EVERY DAY 03/29/21   [name], [name], MD  prochlorperazine (COMPAZINE) 10 MG tablet Take 1 tablet (10 mg total) by mouth every 6 (six) hours as needed for nausea or vomiting. 02/05/20   Curt Bears, MD    Family History Family History  Problem Relation Age of Onset   Heart disease Father    Heart disease Mother    Heart attack Mother    Cirrhosis Brother    Colon cancer Neg Hx    Stomach cancer Neg Hx     Social History Social History   Tobacco Use   Smoking status: Some Days    Packs/day: 0.25    Years: 66.00    Pack years: 16.50    Types: Cigarettes    Last attempt to quit: 12/27/2019     Years since quitting: 1.3   Smokeless tobacco: Never   Tobacco comments:    03/16/21: Per pt, she recently began smoking 2-3 cigs a week but plans to stop again..  Vaping Use   Vaping Use: Never used  Substance Use Topics   Alcohol use: No    Alcohol/week: 0.0 standard drinks   Drug use: No     Allergies   Seasonal ic [cholestatin]   Review of Systems Review of Systems Pertinent negatives listed in HPI   Physical Exam Triage Vital Signs ED Triage Vitals  Enc Vitals Group     BP 04/15/21 1037 133/69     Pulse Rate 04/15/21 1037 65     Resp 04/15/21 1037 16     Temp 04/15/21 1037 98.2 F (36.8 C)     Temp Source 04/15/21 1037 Oral     SpO2 04/15/21 1037 98 %     Weight --      Height --      Head Circumference --      Peak Flow --      Pain Score 04/15/21 1034 0     Pain Loc --      Pain Edu? --      Excl. in [place]? --    No data found.  Updated Vital Signs BP 133/69   Pulse 65   Temp 98.2 F (36.8 C) (Oral)   Resp 16   LMP  (LMP Unknown)   SpO2 98%   Visual Acuity Right Eye Distance:   Left Eye Distance:   Bilateral Distance:    Right Eye Near:   Left Eye Near:    Bilateral Near:     Physical Exam Constitutional:      General: She is not in acute distress.    Appearance: She is not ill-appearing.  Cardiovascular:     Rate and Rhythm: Normal rate and regular rhythm.     Pulses: Normal pulses.  Musculoskeletal:     Cervical back: Normal range of motion.     Right lower leg: Edema present.     Left lower leg: Edema present.     Comments: Non pitting edema (+1 ) bilateral feet and ankles   Skin:    General: Skin  is warm.  Neurological:     General: No focal deficit present.     Mental Status: She is alert and oriented to person, place, and time.  Psychiatric:        Mood and Affect: Mood normal.        Behavior: Behavior normal.        Thought Content: Thought content normal.        Judgment: Judgment normal.     UC Treatments / Results   Labs (all labs ordered are listed, but only abnormal results are displayed) Labs Reviewed  POCT FASTING CBG [facility] MANUAL ENTRY - Abnormal; Notable for the following components:      Result Value   POCT Glucose ([facility]) 258 (*)    All other components within normal limits    EKG   Radiology  Procedures Procedures (including critical care time)  Medications Ordered in UC Medications - No data to display  Initial Impression / Assessment and Plan / UC Course  I have reviewed the triage vital signs and the nursing notes.  Pertinent labs & imaging results that were available during my care of the patient were reviewed by me and considered in my medical decision making (see chart for details).     Acute dermatitis of scalp, tx with triamcinolone cream PRN BLE swelling, appears stable today. Recommend compression socks especially in the presence of prolonged sitting. Follow-up with PCP if symptoms worsen or do not improve. ER if CP or SOB develops. CG and patient bother verbalize understanding and agreement with plan. Final Clinical Impressions(s) / UC Diagnoses   Final diagnoses:  Acute dermatitis scalp  Bilateral swelling of feet  Type 2 diabetes mellitus with other skin complication, without long-term current use of insulin (HCC)     Discharge Instructions      For swelling wear compression socks and keep feet elevated to improve swelling. Keep an eye on blood sugar if readings are consistently greater than 200. Blood sugar is elevated today, please follow-up with Pollie Friar if reading remain in 200 she may need to adjust steriods. Apply triamcinolone cream twice daily as needed to scalp for dermatitis secondary to radiation therapy.  This will help relieve the itching and rash.         ED Prescriptions     Medication Sig Dispense Auth. Provider   triamcinolone (KENALOG) 0.025 % ointment Apply 1 application topically 2 (two) times daily. 90 g Scot Jun, FNP       PDMP not reviewed this encounter.   Scot Jun, [place] 04/18/21 [phone]

## 2021-04-15 NOTE — ED Triage Notes (Signed)
PT last had radiation 03/29/21. Being treated for small cell lung cancer met to brain.

## 2021-04-17 ENCOUNTER — Other Ambulatory Visit (HOSPITAL_COMMUNITY): Payer: Self-pay

## 2021-04-17 ENCOUNTER — Encounter (HOSPITAL_COMMUNITY): Payer: Self-pay | Admitting: Emergency Medicine

## 2021-04-17 ENCOUNTER — Other Ambulatory Visit: Payer: Self-pay

## 2021-04-17 ENCOUNTER — Emergency Department (HOSPITAL_COMMUNITY): Payer: Medicare Other

## 2021-04-17 ENCOUNTER — Emergency Department (HOSPITAL_COMMUNITY)
Admission: EM | Admit: 2021-04-17 | Discharge: 2021-04-17 | Disposition: A | Discharge status: Home / Self Care | Payer: Medicare Other | Attending: Emergency Medicine | Admitting: Emergency Medicine

## 2021-04-17 DIAGNOSIS — Z955 Presence of coronary angioplasty implant and graft: Secondary | ICD-10-CM | POA: Diagnosis not present

## 2021-04-17 DIAGNOSIS — E1169 Type 2 diabetes mellitus with other specified complication: Secondary | ICD-10-CM | POA: Diagnosis not present

## 2021-04-17 DIAGNOSIS — Z85118 Personal history of other malignant neoplasm of bronchus and lung: Secondary | ICD-10-CM | POA: Diagnosis not present

## 2021-04-17 DIAGNOSIS — I2510 Atherosclerotic heart disease of native coronary artery without angina pectoris: Secondary | ICD-10-CM | POA: Insufficient documentation

## 2021-04-17 DIAGNOSIS — I517 Cardiomegaly: Secondary | ICD-10-CM | POA: Diagnosis not present

## 2021-04-17 DIAGNOSIS — E039 Hypothyroidism, unspecified: Secondary | ICD-10-CM | POA: Insufficient documentation

## 2021-04-17 DIAGNOSIS — R0789 Other chest pain: Secondary | ICD-10-CM | POA: Insufficient documentation

## 2021-04-17 DIAGNOSIS — R6889 Other general symptoms and signs: Secondary | ICD-10-CM | POA: Diagnosis not present

## 2021-04-17 DIAGNOSIS — Z950 Presence of cardiac pacemaker: Secondary | ICD-10-CM | POA: Diagnosis not present

## 2021-04-17 DIAGNOSIS — E785 Hyperlipidemia, unspecified: Secondary | ICD-10-CM | POA: Diagnosis not present

## 2021-04-17 DIAGNOSIS — Z85841 Personal history of malignant neoplasm of brain: Secondary | ICD-10-CM | POA: Insufficient documentation

## 2021-04-17 DIAGNOSIS — J449 Chronic obstructive pulmonary disease, unspecified: Secondary | ICD-10-CM | POA: Diagnosis not present

## 2021-04-17 DIAGNOSIS — R531 Weakness: Secondary | ICD-10-CM | POA: Diagnosis not present

## 2021-04-17 DIAGNOSIS — R1110 Vomiting, unspecified: Secondary | ICD-10-CM | POA: Diagnosis not present

## 2021-04-17 DIAGNOSIS — R5383 Other fatigue: Secondary | ICD-10-CM | POA: Insufficient documentation

## 2021-04-17 DIAGNOSIS — Z9221 Personal history of antineoplastic chemotherapy: Secondary | ICD-10-CM | POA: Diagnosis not present

## 2021-04-17 DIAGNOSIS — Z923 Personal history of irradiation: Secondary | ICD-10-CM | POA: Insufficient documentation

## 2021-04-17 DIAGNOSIS — R079 Chest pain, unspecified: Secondary | ICD-10-CM | POA: Diagnosis not present

## 2021-04-17 DIAGNOSIS — R0602 Shortness of breath: Secondary | ICD-10-CM | POA: Diagnosis not present

## 2021-04-17 DIAGNOSIS — R059 Cough, unspecified: Secondary | ICD-10-CM | POA: Insufficient documentation

## 2021-04-17 DIAGNOSIS — Z743 Need for continuous supervision: Secondary | ICD-10-CM | POA: Diagnosis not present

## 2021-04-17 DIAGNOSIS — I499 Cardiac arrhythmia, unspecified: Secondary | ICD-10-CM | POA: Diagnosis not present

## 2021-04-17 LAB — CBC WITH DIFFERENTIAL/PLATELET
Basophils Absolute: 0 10*3/uL (ref 0.0–0.1)
Basophils Relative: 0 %
Eosinophils Absolute: 0 10*3/uL (ref 0.0–0.5)
Eosinophils Relative: 0 %
HCT: 42.6 % (ref 36.0–46.0)
Hemoglobin: 13.9 g/dL (ref 12.0–15.0)
Immature Granulocytes: 0 %
Lymphocytes Relative: 12 %
Lymphs Abs: 0.5 10*3/uL — ABNORMAL LOW (ref 0.7–4.0)
MCH: 30.8 pg (ref 26.0–34.0)
MCHC: 32.6 g/dL (ref 30.0–36.0)
MCV: 95.1 fL (ref 80.0–100.0)
Monocytes Absolute: 0.2 10*3/uL (ref 0.1–1.0)
Monocytes Relative: 6 %
Neutro Abs: 3.5 10*3/uL (ref 1.7–7.7)
Neutrophils Relative %: 82 %
Platelets: 77 10*3/uL — ABNORMAL LOW (ref 150–400)
RBC: 4.48 MIL/uL (ref 3.87–5.11)
RDW: 16.2 % — ABNORMAL HIGH (ref 11.5–15.5)
WBC: 4.4 10*3/uL (ref 4.0–10.5)
nRBC: 0 % (ref 0.0–0.2)

## 2021-04-17 LAB — TROPONIN I (HIGH SENSITIVITY)
Troponin I (High Sensitivity): 18 ng/L — ABNORMAL HIGH (ref <18)
Troponin I (High Sensitivity): 17 ng/L (ref <18)

## 2021-04-17 LAB — COMPREHENSIVE METABOLIC PANEL
ALT: 30 U/L (ref 0–44)
AST: 18 U/L (ref 15–41)
Albumin: 3.3 g/dL — ABNORMAL LOW (ref 3.5–5.0)
Alkaline Phosphatase: 74 U/L (ref 38–126)
Anion gap: 5 (ref 5–15)
BUN: 23 mg/dL (ref 8–23)
CO2: 31 mmol/L (ref 22–32)
Calcium: 9 mg/dL (ref 8.9–10.3)
Chloride: 99 mmol/L (ref 98–111)
Creatinine, Ser: 0.54 mg/dL (ref 0.44–1.00)
GFR, Estimated: >60 mL/min (ref >60)
Glucose, Bld: 188 mg/dL — ABNORMAL HIGH (ref 70–99)
Potassium: 4.3 mmol/L (ref 3.5–5.1)
Sodium: 135 mmol/L (ref 135–145)
Total Bilirubin: 0.9 mg/dL (ref 0.3–1.2)
Total Protein: 5.8 g/dL — ABNORMAL LOW (ref 6.5–8.1)

## 2021-04-17 LAB — MAGNESIUM: Magnesium: 2 mg/dL (ref 1.7–2.4)

## 2021-04-17 LAB — BRAIN NATRIURETIC PEPTIDE: B Natriuretic Peptide: 186.3 pg/mL — ABNORMAL HIGH (ref 0.0–100.0)

## 2021-04-17 MED ORDER — SODIUM CHLORIDE 0.9 % IV BOLUS
500.0000 mL | Freq: Once | INTRAVENOUS | Status: AC
  Administered 2021-04-17: 500 mL via INTRAVENOUS

## 2021-04-17 MED ORDER — HEPARIN SOD (PORK) LOCK FLUSH 100 UNIT/ML IV SOLN
500.0000 [IU] | Freq: Once | INTRAVENOUS | Status: AC
  Administered 2021-04-17: 500 [IU]
  Filled 2021-04-17: qty 5

## 2021-04-17 NOTE — ED Triage Notes (Signed)
Per EMS, ca pt, complains of generalized weakness since treatment on 6/29. Developed chest pain yesterday. Multifocal PVCs on monitor. She refused transport to [facility] and treatment with aspirin.   HR 70 124/54 CBG 228 O2 99%

## 2021-04-17 NOTE — Discharge Instructions (Addendum)
You are seen in the emergency department for some chest pain and some weakness.  You had lab work done along with a chest x-ray EKG that did not show an obvious cause of your symptoms.  You received some IV fluids here and ate some soup with improvement.  Please continue to try to eat something every day.  Contact your primary care doctor and your oncologist for close follow-up.  Return to the emergency department if any worsening or concerning symptoms.

## 2021-04-17 NOTE — ED Notes (Signed)
Pt attempted to provide urine sample. Pt able to urinate, but was not successful in giving urine sample due to feces contamination.

## 2021-04-17 NOTE — ED Provider Notes (Signed)
[facility] DEPT Provider Note   CSN: [id number] Arrival date & time: 04/17/21  0102     History No chief complaint on file.   [name] is a 86 y.o. female.  She is brought in by EMS for generalized weakness and increased shortness of breath.  Some chest discomfort.  Cough.  She has a history of non-small cell lung cancer and just completed chemotherapy and radiation for mets to brain.  She feels she is doing pretty well with that.  EMS states she was having a lot of PVCs.  The history is provided by the patient and the EMS personnel.  Chest Pain Pain location:  L chest Pain quality: dull   Pain radiates to:  Does not radiate Pain severity:  Unable to specify Onset quality:  Unable to specify Progression:  Unchanged Chronicity:  New Relieved by:  None tried Worsened by:  Nothing Ineffective treatments:  None tried Associated symptoms: cough, fatigue and shortness of breath   Associated symptoms: no abdominal pain, no fever, no nausea and no vomiting       Past Medical History:  Diagnosis Date   CAD (coronary artery disease), native coronary artery    PTCA of distal right coronary artery 1997 Cypher stents to circumflex 2005 Cardiac cath in 2011 with patent stent to circumflex and moderate disease elsewhere treated medically    Cancer ([place])    Cardiac pacemaker in situ 12/24/2015   Original implant reportedly in 1991 for tachybradycardia syndrome, generator change in 1999, 2004 and evidently again in 2016 in [place]    COPD (chronic obstructive pulmonary disease) ([place])    Diabetes mellitus without complication ([place])    Hypertension    Hypothyroidism 03/23/2010   Pacemaker    Paroxysmal atrial fibrillation ([place]) 03/23/2010   CHA2DS2VASC score 5     Patient Active Problem List   Diagnosis Date Noted   Metastatic cancer to brain ([place]) 03/16/2021   Epigastric abdominal pain 04/27/2020   Port-A-Cath in place 04/11/2020   Small cell lung  cancer, right ([place]) 01/28/2020   Encounter for antineoplastic chemotherapy 01/28/2020   Encounter for antineoplastic immunotherapy 01/28/2020   Goals of care, counseling/discussion 01/28/2020   Near syncope 01/05/2020   Mediastinal lymphadenopathy 01/01/2020   Multiple lung nodules on CT 01/01/2020   Atherosclerosis of aorta ([place]) 12/28/2019   Chronic diarrhea 12/15/2019   Hyperlipidemia associated with type 2 diabetes mellitus ([place]) 06/10/2019   Chronic rhinitis 02/28/2018   Tobacco use disorder 05/10/2017   Hypertension with heart disease 05/10/2017   Chest pain 03/10/2017   Acute thoracic back pain    Cardiac pacemaker in situ 12/24/2015   History of Graves' disease 12/24/2015   Current use of long term anticoagulation 12/24/2015   Type 2 diabetes mellitus with other specified complication ([place])    Hypothyroidism 03/23/2010   Paroxysmal atrial fibrillation ([facility]) 03/23/2010   COPD (chronic obstructive pulmonary disease) ([place]) 03/23/2010   CAD (coronary artery disease), native coronary artery    GERD     Past Surgical History:  Procedure Laterality Date   BIOPSY  01/08/2020   Procedure: BIOPSY;  Surgeon: Garner Nash, DO;  Location: [facility];  Service: Pulmonary;;   BRONCHIAL BRUSHINGS  01/08/2020   Procedure: BRONCHIAL BRUSHINGS;  Surgeon: Garner Nash, DO;  Location: [facility];  Service: Pulmonary;;   BRONCHIAL WASHINGS  01/08/2020   Procedure: BRONCHIAL WASHINGS;  Surgeon: Garner Nash, DO;  Location: [facility];  Service: Pulmonary;;   CARDIAC  CATHETERIZATION N/A 12/26/2015   Procedure: Left Heart Cath and Coronary Angiography;  Surgeon: Peter M [name], MD;  Location: [facility];  Service: Cardiovascular;  Laterality: N/A;   CARDIOVERSION  05/2015   CHOLECYSTECTOMY  2012   CORONARY ANGIOPLASTY WITH STENT PLACEMENT  1990   ENDOBRONCHIAL ULTRASOUND  01/08/2020   Procedure: ENDOBRONCHIAL ULTRASOUND;  Surgeon: Garner Nash, DO;  Location: [facility];   Service: Pulmonary;;   FINE NEEDLE ASPIRATION BIOPSY  01/08/2020   Procedure: FINE NEEDLE ASPIRATION BIOPSY;  Surgeon: Garner Nash, DO;  Location: [facility];  Service: Pulmonary;;   HERNIA REPAIR     IR IMAGING GUIDED PORT INSERTION  02/18/2020   PACEMAKER GENERATOR CHANGE  [id number],[id number], 2016   PACEMAKER INSERTION  1991   VIDEO BRONCHOSCOPY WITH ENDOBRONCHIAL ULTRASOUND N/A 01/08/2020   Procedure: VIDEO BRONCHOSCOPY;  Surgeon: Garner Nash, DO;  Location: [facility];  Service: Pulmonary;  Laterality: N/A;     OB History   No obstetric history on file.     Family History  Problem Relation Age of Onset   Heart disease Father    Heart disease Mother    Heart attack Mother    Cirrhosis Brother    Colon cancer Neg Hx    Stomach cancer Neg Hx     Social History   Tobacco Use   Smoking status: Some Days    Packs/day: 0.25    Years: 66.00    Pack years: 16.50    Types: Cigarettes    Last attempt to quit: 12/27/2019    Years since quitting: 1.3   Smokeless tobacco: Never   Tobacco comments:    03/16/21: Per pt, she recently began smoking 2-3 cigs a week but plans to stop again..  Vaping Use   Vaping Use: Never used  Substance Use Topics   Alcohol use: No    Alcohol/week: 0.0 standard drinks   Drug use: No    Home Medications Prior to Admission medications   Medication Sig Start Date End Date Taking? Authorizing Provider  acetaminophen (TYLENOL) 500 MG tablet Take 500 mg by mouth every 6 (six) hours as needed for headache.    [provider]  albuterol (VENTOLIN HFA) 108 (90 Base) MCG/ACT inhaler TAKE 2 PUFFS BY MOUTH EVERY 6 HOURS AS NEEDED FOR WHEEZE OR SHORTNESS OF BREATH 05/25/20   [name], [name], MD  atorvastatin (LIPITOR) 10 MG tablet TAKE 1 TABLET BY MOUTH EVERY DAY 12/12/20   [name], MD  carvedilol (COREG) 12.5 MG tablet Take 1 tablet (12.5 mg total) by mouth 2 (two) times daily with a meal. 03/29/21   [name], [name], MD  dexamethasone  (DECADRON) 4 MG tablet To start, Take 1 tab twice daily; Then, taper to 1 tab daily on July 18; Then, taper to 1/2 tab daily on Aug 1; Then, taper to 1/2 tab every other day on Aug 8th. Take last dose Aug 20. 04/07/21   [name], MD  diclofenac sodium (VOLTAREN) 1 % GEL Apply 4 g topically 4 (four) times daily. 11/12/18   [name], [name], MD  fenofibrate (TRICOR) 48 MG tablet TAKE 1 TABLET BY MOUTH EVERY DAY 11/21/20   [name], [name], MD  fluconazole (DIFLUCAN) 100 MG tablet Take 2 tablets by mouth today, then 1 tablet daily x 20 more days. Hold Atorvastatin while on this. 03/24/21   [name], MD  glucose blood [facility] VERIO) test strip Use to test 3-4 times daily. 09/08/19  [name], [name], MD  KLOR-CON M20 20 MEQ tablet TAKE 1 TABLET BY MOUTH TWICE A DAY FOR 2 DAYS THEN TAKE 1 TABLET BY MOUTH EVERY DAY 03/10/21   [name], [name], MD  levothyroxine (SYNTHROID) 112 MCG tablet Take 1 tablet (112 mcg total) by mouth daily before breakfast. 11/07/20   [name], [name], MD  lidocaine-prilocaine (EMLA) cream Apply 1 application topically as needed. 04/11/20   Heilingoetter, Cassandra L, PA-C  Loperamide HCl (IMODIUM PO) Take by mouth as needed.    [provider]  losartan (COZAAR) 100 MG tablet Take 1 tablet (100 mg total) by mouth daily. 01/17/21   [name], [name], MD  nicotine (NICODERM CQ - DOSED IN MG/24 HOURS) 14 mg/24hr patch PLACE 1 PATCH ONTO THE SKIN DAILY. 02/20/21   [name], [name], MD  nitroGLYCERIN (NITROSTAT) 0.4 MG SL tablet Place 1 tablet (0.4 mg total) under the tongue every 5 (five) minutes x 3 doses as needed for chest pain (Max 3 doses within 15 min. Call 911). 05/26/20   Sherran Needs, NP  oxyCODONE-acetaminophen (PERCOCET/ROXICET) 5-325 MG tablet Take 1 tablet by mouth every 8 (eight) hours as needed for severe pain. 02/10/20   Curt Bears, MD  pantoprazole (PROTONIX) 40 MG tablet TAKE 1 TABLET BY MOUTH EVERY DAY 03/29/21   [name], [name], MD  prochlorperazine  (COMPAZINE) 10 MG tablet Take 1 tablet (10 mg total) by mouth every 6 (six) hours as needed for nausea or vomiting. 02/05/20   Curt Bears, MD  triamcinolone (KENALOG) 0.025 % ointment Apply 1 application topically 2 (two) times daily. 04/15/21   Scot Jun, FNP    Allergies    Seasonal ic [cholestatin]  Review of Systems   Review of Systems  Constitutional:  Positive for fatigue. Negative for fever.  HENT:  Negative for sore throat.   Eyes:  Negative for visual disturbance.  Respiratory:  Positive for cough and shortness of breath.   Cardiovascular:  Positive for chest pain.  Gastrointestinal:  Negative for abdominal pain, nausea and vomiting.  Genitourinary:  Negative for dysuria.  Musculoskeletal:  Negative for neck pain.  Skin:  Negative for rash.  Neurological:  Negative for speech difficulty.   Physical Exam Updated Vital Signs BP (!) 112/93 (BP Location: Left Arm)   Pulse 68   Temp 98.4 F (36.9 C) (Oral)   Resp 20   LMP  (LMP Unknown)   SpO2 98%   Physical Exam Vitals and nursing note reviewed.  Constitutional:      General: She is not in acute distress.    Appearance: Normal appearance. She is well-developed.  HENT:     Head: Normocephalic and atraumatic.  Eyes:     Conjunctiva/sclera: Conjunctivae normal.  Cardiovascular:     Rate and Rhythm: Normal rate. Rhythm irregular.     Heart sounds: No murmur heard. Pulmonary:     Effort: Pulmonary effort is normal. No respiratory distress.     Breath sounds: Normal breath sounds.  Abdominal:     Palpations: Abdomen is soft.     Tenderness: There is no abdominal tenderness. There is no guarding or rebound.  Musculoskeletal:        General: Normal range of motion.     Cervical back: Neck supple.     Right lower leg: No edema.     Left lower leg: No edema.  Skin:    General: Skin is warm and dry.  Neurological:     General: No focal  deficit present.     Mental Status: She is alert.    ED Results /  Procedures / Treatments   Labs (all labs ordered are listed, but only abnormal results are displayed) Labs Reviewed  COMPREHENSIVE METABOLIC PANEL - Abnormal; Notable for the following components:      Result Value   Glucose, Bld 188 (*)    Total Protein 5.8 (*)    Albumin 3.3 (*)    All other components within normal limits  CBC WITH DIFFERENTIAL/PLATELET - Abnormal; Notable for the following components:   RDW 16.2 (*)    Platelets 77 (*)    Lymphs Abs 0.5 (*)    All other components within normal limits  BRAIN NATRIURETIC PEPTIDE - Abnormal; Notable for the following components:   B Natriuretic Peptide 186.3 (*)    All other components within normal limits  TROPONIN I (HIGH SENSITIVITY) - Abnormal; Notable for the following components:   Troponin I (High Sensitivity) 18 (*)    All other components within normal limits  MAGNESIUM  URINALYSIS, ROUTINE W REFLEX MICROSCOPIC  TROPONIN I (HIGH SENSITIVITY)    EKG EKG Interpretation  Date/Time:  Monday April 17 2021 10:29:19 EDT Ventricular Rate:  67 PR Interval:    QRS Duration: 98 QT Interval:  404 QTC Calculation: 430 R Axis:   69 Text Interpretation: Atrial fibrillation Abnrm T, consider ischemia, anterolateral lds Premature ventricular complexes No significant change since prior 8/21 Confirmed by Aletta Edouard [phone]) on 04/17/2021 10:33:21 AM  Radiology DG Chest Port 1 View  Result Date: 04/17/2021 CLINICAL DATA:  86 year old female with shortness of breath and weakness since cancer treatment last month. EXAM: PORTABLE CHEST 1 VIEW COMPARISON:  Restaging CT Chest, Abdomen, and Pelvis 03/14/2021 and earlier. FINDINGS: Portable AP semi upright view at 1039 hours. Stable right chest cardiac pacemaker. Stable cardiomegaly and mediastinal contours. Stable left chest power port. Mildly lower lung volumes. Lung markings appear stable. No pneumothorax or pleural effusion. No pulmonary edema. Stable visualized osseous structures.  Paucity of bowel gas in the upper abdomen. IMPRESSION: Stable.  No acute cardiopulmonary abnormality. Electronically Signed   By: Genevie Ann M.D.   On: 04/17/2021 11:19    Procedures Procedures   Medications Ordered in ED Medications  sodium chloride 0.9 % bolus 500 mL (0 mLs Intravenous Stopped 04/17/21 1542)  heparin lock flush 100 unit/mL (500 Units Intracatheter Given 04/17/21 1650)    ED Course  I have reviewed the triage vital signs and the nursing notes.  Pertinent labs & imaging results that were available during my care of the patient were reviewed by me and considered in my medical decision making (see chart for details).  Clinical Course as of 04/17/21 1825  Mon Apr 17, 2021  1110 Chest x-ray interpreted by me as pacemaker in place along with Port-A-Cath. no acute infiltrates.  Awaiting radiology reading. [MB]  1206 Granddaughter is here now who helps take care of the patient.  She said she has been weak since she finished her chemo and radiation.  Fell yesterday. [MB]  4270 Patient is feeling better and is eating some superior.  Sounds like is the first thing she is eaten a little bit.  She wants to go home.  No evidence of cardiac injury.  BNP mildly elevated from baseline chest x-ray does not reflect this. [MB]    Clinical Course User Index [MB] Hayden Rasmussen, MD   MDM Rules/Calculators/A&P  This patient complains of vague chest pain and generalized weakness; this involves an extensive number of treatment Options and is a complaint that carries with it a high risk of complications and Morbidity. The differential includes ACS, pneumonia, pneumothorax, cancer related pain, PE, dehydration, metabolic derangement, renal failure  I ordered, reviewed and interpreted labs, which included CBC with normal white count normal hemoglobin, platelets lower than priors, chemistries elevated glucose normal LFTs, troponins flat, BNP mildly elevated, urinalysis  ordered but not obtained I ordered medication IV fluids I ordered imaging studies which included chest x-ray and I independently    visualized and interpreted imaging which showed no acute findings Additional history obtained from patient's granddaughter Previous records obtained and reviewed in epic including prior oncology visits  After the interventions stated above, I reevaluated the patient and found patient's pain to be improved and her feeling better.  She was able to tolerate a bowl of soup.  She would like to go home.  Cannot see a clear indication for admission.  Recommended close follow-up with primary care and oncology.  Return instructions discussed   Final Clinical Impression(s) / ED Diagnoses Final diagnoses:  Nonspecific chest pain  General weakness    Rx / DC Orders ED Discharge Orders     None        Hayden Rasmussen, MD 04/17/21 [place]

## 2021-04-17 NOTE — ED Notes (Signed)
Pt provided chicken noodle soup and water.

## 2021-04-18 ENCOUNTER — Other Ambulatory Visit (HOSPITAL_COMMUNITY): Payer: Self-pay

## 2021-04-26 ENCOUNTER — Inpatient Hospital Stay: Payer: Medicare Other | Admitting: Family Medicine

## 2021-04-26 ENCOUNTER — Other Ambulatory Visit: Payer: Self-pay

## 2021-04-26 ENCOUNTER — Encounter: Payer: Self-pay | Admitting: Internal Medicine

## 2021-04-26 ENCOUNTER — Other Ambulatory Visit (HOSPITAL_COMMUNITY): Payer: Self-pay

## 2021-04-26 ENCOUNTER — Ambulatory Visit (INDEPENDENT_AMBULATORY_CARE_PROVIDER_SITE_OTHER): Payer: Medicare Other | Admitting: Family Medicine

## 2021-04-26 ENCOUNTER — Encounter: Payer: Self-pay | Admitting: Family Medicine

## 2021-04-26 VITALS — BP 120/70 | HR 91 | Resp 16 | Ht 62.0 in

## 2021-04-26 DIAGNOSIS — I700 Atherosclerosis of aorta: Secondary | ICD-10-CM | POA: Diagnosis not present

## 2021-04-26 DIAGNOSIS — I25119 Atherosclerotic heart disease of native coronary artery with unspecified angina pectoris: Secondary | ICD-10-CM

## 2021-04-26 DIAGNOSIS — D6869 Other thrombophilia: Secondary | ICD-10-CM | POA: Insufficient documentation

## 2021-04-26 DIAGNOSIS — J42 Unspecified chronic bronchitis: Secondary | ICD-10-CM | POA: Diagnosis not present

## 2021-04-26 DIAGNOSIS — C7931 Secondary malignant neoplasm of brain: Secondary | ICD-10-CM

## 2021-04-26 DIAGNOSIS — E785 Hyperlipidemia, unspecified: Secondary | ICD-10-CM

## 2021-04-26 DIAGNOSIS — E1169 Type 2 diabetes mellitus with other specified complication: Secondary | ICD-10-CM

## 2021-04-26 DIAGNOSIS — I119 Hypertensive heart disease without heart failure: Secondary | ICD-10-CM

## 2021-04-26 LAB — POCT GLYCOSYLATED HEMOGLOBIN (HGB A1C): Hemoglobin A1C: 8.8 % — AB (ref 4.0–5.6)

## 2021-04-26 MED ORDER — ADVAIR HFA 115-21 MCG/ACT IN AERO
2.0000 | INHALATION_SPRAY | Freq: Two times a day (BID) | RESPIRATORY_TRACT | 4 refills | Status: DC
  Filled 2021-04-26: qty 12, 30d supply, fill #0

## 2021-04-26 MED ORDER — AEROCHAMBER PLUS MISC
1 refills | Status: AC
Start: 2021-04-26 — End: ?
  Filled 2021-04-26: qty 1, 1d supply, fill #0

## 2021-04-26 MED ORDER — LINAGLIPTIN 5 MG PO TABS
5.0000 mg | ORAL_TABLET | Freq: Every day | ORAL | 1 refills | Status: DC
  Filled 2021-04-26: qty 90, 90d supply, fill #0

## 2021-04-26 NOTE — Patient Instructions (Addendum)
A few things to remember from today's visit:   Type 2 diabetes mellitus with other specified complication, without long-term current use of insulin ([place]) - Plan: POC HgB A1c  Hypertension with heart disease  Secondary hypercoagulable state ([place])  Atherosclerosis of aorta ([place]), Chronic  Coronary artery disease involving native coronary artery of native heart with angina pectoris ([place]), Chronic  Chronic bronchitis, unspecified chronic bronchitis type ([place])  Today we are resuming daily inhaler to see if it helps with breathing,Advair. Use inhalers with spacer. Resume Tradgenta. Stop Fenofibrate. Rest unchanged. Today we signed a DNR. Keep appt with oncologist.  If you need refills please call your pharmacy. Do not use My Chart to request refills or for acute issues that need immediate attention.    Please be sure medication list is accurate. If a new problem present, please set up appointment sooner than planned today.

## 2021-04-26 NOTE — Assessment & Plan Note (Signed)
Problem is not well controlled. Continue albuterol 2 puffs every 4-6 hours as needed. She agrees with adding Advair HFA 2 puff twice daily.  Son side effects discussed, instructed to rinse mouth after use. Recommend using a spacer with inhalers, which may improve medication delivery.

## 2021-04-26 NOTE — Assessment & Plan Note (Signed)
Problem most likely aggravated by dexamethasone treatment. HgA1C is not at goal. Resume Tradjenta 5 mg daily. Continue appropriate foot care and annual eye exams. F/U in 4 months

## 2021-04-26 NOTE — Assessment & Plan Note (Signed)
BP adequately controlled. Continue losartan 100 mg daily and carvedilol 12.5 mg twice daily.

## 2021-04-26 NOTE — Assessment & Plan Note (Signed)
Seen on CT on 12/28/2019. Continue atorvastatin 10 mg daily.

## 2021-04-26 NOTE — Progress Notes (Signed)
HPI:  [name] is a 86 y.o. female, who is here today with her daughter to follow on recent ED visit. She was evaluated in the ED on 04/17/21 because generalized weakness ,CP,and worsening SOB, taken via EMS. EMS personal reported PVC's She had a fall on 04/16/21. According to daughter, symptoms started few days after completing radiation therapy. Radiation has also aggravated diarrhea (chronic).  Since her last visit she has follow-up with her oncologist. She was having some transient headaches and memory difficulties, mainly short term.  Head CT done on 03/14/2021 showed 3 intracranial metastatic lesions, new as compared with head CT done on 01/05/2020.  These lesions are located within the inferior medial left cerebellum, right midbrain/pons, and right occipital lobe.  The right occipital and left cerebellar lesion are largest, measuring 2.5 cm in the greatest dimension. 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  Currently she is on dexamethasone, which she has been weaned off, currently on 4 mg 1/2 tablet daily, immunotherapy is on hold until she completes dexamethasone treatment.  COPD: Currently she is on albuterol inhaler 2 puff every 4-6 hours as needed, she does not use it very frequent. Dyspnea exacerbated by mild exertion. No associated CP or palpitations. Productive cough and wheezing, mainly in the morning, stable.  Lab Results  Component Value Date   WBC 4.4 04/17/2021   HGB 13.9 04/17/2021   HCT 42.6 04/17/2021   MCV 95.1 04/17/2021   PLT 77 (L) 04/17/2021   Lab Results  Component Value Date   CREATININE 0.54 04/17/2021   BUN 23 04/17/2021   NA 135 04/17/2021   K 4.3 04/17/2021   CL 99 04/17/2021   CO2 31 04/17/2021   EKG Interpretation   Date/Time:                  Monday April 17 2021 10:29:19 EDT Ventricular Rate:         67 PR Interval:                   QRS Duration: 98 QT Interval:                 404 QTC Calculation:        430 R Axis:                          69 Text Interpretation:      Atrial fibrillation Abnrm T, consider ischemia, anterolateral lds Premature ventricular complexes No significant change since prior 8/21 Confirmed by Aletta Edouard [phone]) on 04/17/2021 10:33:21 AM  Paroxymal atrial fib and pacemaker in situ: She is no longer on oral anticoagulation. Hypertension on losartan 100 mg daily and carvedilol 12.5 mg twice daily.  DM2: Currently she is on nonpharmacologic treatment. BS has been in the 200s since she started dexamethasone. Appetite has increased, she is eating well. Negative for polyuria and polydipsia.  Lab Results  Component Value Date   HGBA1C 6.2 01/17/2021   Hyperlipidemia: Currently she is on fenofibrate 48 mg daily and atorvastatin 10 mg daily. She has tolerated medication well, no side effects.  Lab Results  Component Value Date   CHOL 116 01/17/2021   HDL 63.70 01/17/2021   LDLCALC 43 01/17/2021   TRIG 49.0 01/17/2021   CHOLHDL 2 01/17/2021   Review of Systems  Constitutional:  Positive for activity change and fatigue. Negative for appetite change and fever.  HENT:  Negative for mouth sores, nosebleeds and  trouble swallowing.   Eyes:  Negative for redness and visual disturbance.  Cardiovascular:  Negative for chest pain, palpitations and leg swelling.  Gastrointestinal:  Negative for abdominal pain, nausea and vomiting.       Negative for changes in bowel habits.  Genitourinary:  Negative for decreased urine volume, dysuria and hematuria.  Musculoskeletal:  Positive for gait problem. Negative for myalgias.  Neurological:  Negative for syncope, facial asymmetry and weakness.  Rest see pertinent positives and negatives per HPI.  Current Outpatient Medications on File Prior to Visit  Medication Sig Dispense Refill   acetaminophen (TYLENOL) 500 MG tablet Take 500 mg by mouth every 6 (six) hours as needed for headache.     albuterol (VENTOLIN HFA) 108 (90 Base) MCG/ACT inhaler  TAKE 2 PUFFS BY MOUTH EVERY 6 HOURS AS NEEDED FOR WHEEZE OR SHORTNESS OF BREATH 1 each 1   atorvastatin (LIPITOR) 10 MG tablet TAKE 1 TABLET BY MOUTH EVERY DAY 90 tablet 0   carvedilol (COREG) 12.5 MG tablet Take 1 tablet (12.5 mg total) by mouth 2 (two) times daily with a meal. 180 tablet 3   dexamethasone (DECADRON) 4 MG tablet To start, Take 1 tab twice daily; Then, taper to 1 tab daily on July 18; Then, taper to 1/2 tab daily on Aug 1; Then, taper to 1/2 tab every other day on Aug 8th. Take last dose Aug 20. 60 tablet 0   diclofenac sodium (VOLTAREN) 1 % GEL Apply 4 g topically 4 (four) times daily. 4 Tube 3   fluconazole (DIFLUCAN) 100 MG tablet Take 2 tablets by mouth today, then 1 tablet daily x 20 more days. Hold Atorvastatin while on this. 22 tablet 0   glucose blood (ONETOUCH VERIO) test strip Use to test 3-4 times daily. 300 strip 4   KLOR-CON M20 20 MEQ tablet TAKE 1 TABLET BY MOUTH TWICE A DAY FOR 2 DAYS THEN TAKE 1 TABLET BY MOUTH EVERY DAY 90 tablet 1   levothyroxine (SYNTHROID) 112 MCG tablet Take 1 tablet (112 mcg total) by mouth daily before breakfast. 90 tablet 2   lidocaine-prilocaine (EMLA) cream Apply 1 application topically as needed. 30 g 2   Loperamide HCl (IMODIUM PO) Take by mouth as needed.     losartan (COZAAR) 100 MG tablet Take 1 tablet (100 mg total) by mouth daily. 90 tablet 2   nicotine (NICODERM CQ - DOSED IN MG/24 HOURS) 14 mg/24hr patch PLACE 1 PATCH ONTO THE SKIN DAILY. 28 patch 0   nitroGLYCERIN (NITROSTAT) 0.4 MG SL tablet Place 1 tablet (0.4 mg total) under the tongue every 5 (five) minutes x 3 doses as needed for chest pain (Max 3 doses within 15 min. Call 911). 10 tablet 0   oxyCODONE-acetaminophen (PERCOCET/ROXICET) 5-325 MG tablet Take 1 tablet by mouth every 8 (eight) hours as needed for severe pain. 20 tablet 0   pantoprazole (PROTONIX) 40 MG tablet TAKE 1 TABLET BY MOUTH EVERY DAY 90 tablet 3   prochlorperazine (COMPAZINE) 10 MG tablet Take 1 tablet (10  mg total) by mouth every 6 (six) hours as needed for nausea or vomiting. 30 tablet 1   triamcinolone (KENALOG) 0.025 % ointment Apply 1 application topically 2 (two) times daily. 90 g 0   No current facility-administered medications on file prior to visit.    Past Medical History:  Diagnosis Date   CAD (coronary artery disease), native coronary artery    PTCA of distal right coronary artery 1997 Cypher stents to circumflex 2005  Cardiac cath in 2011 with patent stent to circumflex and moderate disease elsewhere treated medically    Cancer [facility])    Cardiac pacemaker in situ 12/24/2015   Original implant reportedly in 1991 for tachybradycardia syndrome, generator change in 1999, 2004 and evidently again in 2016 in [place]    COPD (chronic obstructive pulmonary disease) ([place])    Diabetes mellitus without complication ([place])    Hypertension    Hypothyroidism 03/23/2010   Pacemaker    Paroxysmal atrial fibrillation (HCC) 03/23/2010   CHA2DS2VASC score 5    Allergies  Allergen Reactions   Seasonal Ic [Cholestatin]     Itchy eye and runny nose    Social History   Socioeconomic History   Marital status: Widowed    Spouse name: Not on file   Number of children: Not on file   Years of education: Not on file   Highest education level: Not on file  Occupational History   Occupation: retired  Tobacco Use   Smoking status: Some Days    Packs/day: 0.25    Years: 66.00    Pack years: 16.50    Types: Cigarettes    Last attempt to quit: 12/27/2019    Years since quitting: 1.3   Smokeless tobacco: Never   Tobacco comments:    03/16/21: Per pt, she recently began smoking 2-3 cigs a week but plans to stop again..  Vaping Use   Vaping Use: Never used  Substance and Sexual Activity   Alcohol use: No    Alcohol/week: 0.0 standard drinks   Drug use: No   Sexual activity: Not on file  Other Topics Concern   Not on file  Social History Narrative   Not on file   Social Determinants of  Health   Financial Resource Strain: Low Risk    Difficulty of Paying Living Expenses: Not hard at all  Food Insecurity: No Food Insecurity   Worried About Charity fundraiser in the Last Year: Never true   [place] in the Last Year: Never true  Transportation Needs: No Transportation Needs   Lack of Transportation (Medical): No   Lack of Transportation (Non-Medical): No  Physical Activity: Inactive   Days of Exercise per Week: 0 days   Minutes of Exercise per Session: 0 min  Stress: No Stress Concern Present   Feeling of Stress : Not at all  Social Connections: Socially Isolated   Frequency of Communication with Friends and Family: More than three times a week   Frequency of Social Gatherings with Friends and Family: Never   Attends Religious Services: Never   Marine scientist or Organizations: No   Attends Archivist Meetings: Never   Marital Status: Widowed   Vitals:   04/26/21 0805  BP: 120/70  Pulse: 91  Resp: 16  SpO2: 97%   Wt Readings from Last 3 Encounters:  03/16/21 110 lb 6.4 oz (50.1 kg)  03/16/21 109 lb 6.4 oz (49.6 kg)  03/15/21 109 lb 3.2 oz (49.5 kg)   Body mass index is 20.19 kg/m.  Physical Exam Vitals and nursing note reviewed.  Constitutional:      General: She is not in acute distress.    Appearance: She is well-developed.  HENT:     Head: Normocephalic and atraumatic.     Mouth/Throat:     Mouth: Mucous membranes are moist.     Dentition: Has dentures.     Pharynx: Oropharynx is clear.  Eyes:  Conjunctiva/sclera: Conjunctivae normal.  Cardiovascular:     Rate and Rhythm: Normal rate. Rhythm irregular.     Heart sounds: No murmur heard. Pulmonary:     Effort: Pulmonary effort is normal. No respiratory distress.     Breath sounds: Normal breath sounds.  Abdominal:     Palpations: Abdomen is soft.     Tenderness: There is no abdominal tenderness.  Skin:    General: Skin is warm.     Findings: No erythema or  rash.  Neurological:     General: No focal deficit present.     Mental Status: She is alert.     Cranial Nerves: No cranial nerve deficit.     Gait: Gait normal.     Comments: Unstable gait, assisted with a walker. Difficulty getting up from chair.  Psychiatric:     Comments: Well groomed, good eye contact.   ASSESSMENT AND PLAN:  [name] was seen today for hospitalization follow-up.  Diagnoses and all orders for this visit: Orders Placed This Encounter  Procedures   POC HgB A1c   Lab Results  Component Value Date   HGBA1C 8.8 (A) 04/26/2021   COPD (chronic obstructive pulmonary disease) ([place]) Problem is not well controlled. Continue albuterol 2 puffs every 4-6 hours as needed. She agrees with adding Advair HFA 2 puff twice daily.  Son side effects discussed, instructed to rinse mouth after use. Recommend using a spacer with inhalers, which may improve medication delivery.  Type 2 diabetes mellitus with other specified complication ([place]) Problem most likely aggravated by dexamethasone treatment. HgA1C is not at goal. Resume Tradjenta 5 mg daily. Continue appropriate foot care and annual eye exams. F/U in 4 months   Hypertension with heart disease BP adequately controlled. Continue losartan 100 mg daily and carvedilol 12.5 mg twice daily.  Hyperlipidemia associated with type 2 diabetes mellitus (HCC) Continue atorvastatin 10 mg daily. Fenofibrate 48 mg discontinue.  CAD (coronary artery disease), native coronary artery Currently asymptomatic. Continue carvedilol 12.5 mg twice daily and atorvastatin 10 mg daily. Following with cardiologist.  Atherosclerosis of aorta [facility]) Seen on CT on 12/28/2019. Continue atorvastatin 10 mg daily.  Metastatic cancer to brain [facility]) Currently on dexamethasone 2 mg daily. She is not having seizure disorder but her daughter is noticing worsening short-term memory. She has an appointment with her oncologist in 05/2021 to reevaluate  and discuss next step.  I spent a total of 66 minutes in both face to face and non face to face activities for this visit on the date of this encounter. During this time history was obtained and documented, examination was performed, prior labs/imaging reviewed, and assessment/plan discussed. We spent some time discussing goals of care.  She already has POA and living will. She wants to be DNR. DNR for signed.  Return in about 4 months (around 08/27/2021).   Yazhini Mcaulay G. [name], MD  [facility]. [place] office.

## 2021-04-26 NOTE — Assessment & Plan Note (Signed)
Continue atorvastatin 10 mg daily. Fenofibrate 48 mg discontinue.

## 2021-04-26 NOTE — Assessment & Plan Note (Signed)
Currently asymptomatic. Continue carvedilol 12.5 mg twice daily and atorvastatin 10 mg daily. Following with cardiologist.

## 2021-04-26 NOTE — Assessment & Plan Note (Signed)
Currently on dexamethasone 2 mg daily. She is not having seizure disorder but her daughter is noticing worsening short-term memory. She has an appointment with her oncologist in 05/2021 to reevaluate and discuss next step.

## 2021-04-28 ENCOUNTER — Other Ambulatory Visit (HOSPITAL_COMMUNITY): Payer: Self-pay

## 2021-05-01 NOTE — Progress Notes (Signed)
**[name] De-Identified via Obfuscation** Robin [name]  [name], [name], MD [name] [place] [address]  DIAGNOSIS: Extensive stage (T2 a, N2, M1 C) small cell lung cancer presented with central right upper lobe lung mass in addition to left lower lobe pulmonary metastasis and bilateral hilar, subcarinal and bilateral paratracheal and left prevascular lymphadenopathy in addition to bone metastasis in the left iliac wing diagnosed in April 2021.  She was found to have new metastatic disease to the brain in June 2022  PRIOR THERAPY:  1) Whole brain radiation under the care of Dr. Isidore Moos, last dose on 03/29/21.   CURRENT THERAPY: 1) Systemic chemotherapy with carboplatin for AUC of 5 on day 1, etoposide 100 mg/M2 on days 1, 2 and 3 with Neulasta support in addition to Imfinzi 1500 mg IV every 3 weeks during chemotherapy followed by maintenance Imfinzi 1500 mg IV every 4 weeks after cycle #4.  Status post 16 cycles. She started maintenance immunotherapy with single agent Imfinzi starting from cycle #5   INTERVAL HISTORY: Robin [name] 86 y.o. female returns to the clinic today for a follow-up visit.  The patient was last seen in clinic on 03/16/2021.  At that time, the patient was recently diagnosed with new metastatic disease to the brain in which she was undergoing whole brain radiation under the care of Dr. Isidore Moos.  Due to the patient being on steroids, it was recommended at that time that the patient delay treatment by 3 weeks.  For unclear reasons, the patient's 3-week follow-up appointment in the clinic was canceled on 7/7. She is curretnly on 2 mg of decadron every other day.   Since her last appointment, the patient's been feeling "alright".  She did present to the emergency room on 04/17/2021 for generalized weakness and received IV fluids. Today she denies any fever, night sweats, or unexplained weight loss.  She reports her baseline dyspnea on exertion.  She denies any chest pain or  hemoptysis.  She reports her baseline cough. Her PCP recently placed her on Advire to see if it helps with her COPD. She also recently had adjustments to her diabetes medications. She denies any nausea or vomiting or changes with her baseline diarrhea. She uses imodium which "calms" her diarrhea down. She denies any constipation.  She has a 1 month follow up visit with Dr. Isidore Moos on 8/12/ 22.  The patient is here today for evaluation and repeat blood work before considering starting cycle #17.     MEDICAL HISTORY: Past Medical History:  Diagnosis Date   CAD (coronary artery disease), native coronary artery    PTCA of distal right coronary artery 1997 Cypher stents to circumflex 2005 Cardiac cath in 2011 with patent stent to circumflex and moderate disease elsewhere treated medically    Cancer ([place])    Cardiac pacemaker in situ 12/24/2015   Original implant reportedly in 1991 for tachybradycardia syndrome, generator change in 1999, 2004 and evidently again in 2016 in [place]    COPD (chronic obstructive pulmonary disease) ([place])    Diabetes mellitus without complication ([place])    Hypertension    Hypothyroidism 03/23/2010   Pacemaker    Paroxysmal atrial fibrillation ([place]) 03/23/2010   CHA2DS2VASC score 5     ALLERGIES:  is allergic to seasonal ic [cholestatin].  MEDICATIONS:  Current Outpatient Medications  Medication Sig Dispense Refill   acetaminophen (TYLENOL) 500 MG tablet Take 500 mg by mouth every 6 (six) hours as needed for headache.  albuterol (VENTOLIN HFA) 108 (90 Base) MCG/ACT inhaler TAKE 2 PUFFS BY MOUTH EVERY 6 HOURS AS NEEDED FOR WHEEZE OR SHORTNESS OF BREATH 1 each 1   atorvastatin (LIPITOR) 10 MG tablet TAKE 1 TABLET BY MOUTH EVERY DAY 90 tablet 0   carvedilol (COREG) 12.5 MG tablet Take 1 tablet (12.5 mg total) by mouth 2 (two) times daily with a meal. 180 tablet 3   dexamethasone (DECADRON) 4 MG tablet To start, Take 1 tab twice daily; Then, taper to 1 tab daily on  July 18; Then, taper to 1/2 tab daily on Aug 1; Then, taper to 1/2 tab every other day on Aug 8th. Take last dose Aug 20. 60 tablet 0   diclofenac sodium (VOLTAREN) 1 % GEL Apply 4 [name] topically 4 (four) times daily. 4 Tube 3   fluconazole (DIFLUCAN) 100 MG tablet Take 2 tablets by mouth today, then 1 tablet daily x 20 more days. Hold Atorvastatin while on this. 22 tablet 0   fluticasone-salmeterol (ADVAIR HFA) 115-21 MCG/ACT inhaler Inhale 2 puffs into the lungs 2 (two) times daily. 12 [name] 4   glucose blood (ONETOUCH VERIO) test strip Use to test 3-4 times daily. 300 strip 4   KLOR-CON M20 20 MEQ tablet TAKE 1 TABLET BY MOUTH TWICE A DAY FOR 2 DAYS THEN TAKE 1 TABLET BY MOUTH EVERY DAY 90 tablet 1   levothyroxine (SYNTHROID) 112 MCG tablet Take 1 tablet (112 mcg total) by mouth daily before breakfast. 90 tablet 2   lidocaine-prilocaine (EMLA) cream Apply 1 application topically as needed. 30 [name] 2   linagliptin (TRADJENTA) 5 MG TABS tablet Take 1 tablet (5 mg total) by mouth daily. 90 tablet 1   Loperamide HCl (IMODIUM PO) Take by mouth as needed.     losartan (COZAAR) 100 MG tablet Take 1 tablet (100 mg total) by mouth daily. 90 tablet 2   nicotine (NICODERM CQ - DOSED IN MG/24 HOURS) 14 mg/24hr patch PLACE 1 PATCH ONTO THE SKIN DAILY. 28 patch 0   nitroGLYCERIN (NITROSTAT) 0.4 MG SL tablet Place 1 tablet (0.4 mg total) under the tongue every 5 (five) minutes x 3 doses as needed for chest pain (Max 3 doses within 15 min. Call 911). 10 tablet 0   oxyCODONE-acetaminophen (PERCOCET/ROXICET) 5-325 MG tablet Take 1 tablet by mouth every 8 (eight) hours as needed for severe pain. 20 tablet 0   pantoprazole (PROTONIX) 40 MG tablet TAKE 1 TABLET BY MOUTH EVERY DAY 90 tablet 3   prochlorperazine (COMPAZINE) 10 MG tablet Take 1 tablet (10 mg total) by mouth every 6 (six) hours as needed for nausea or vomiting. 30 tablet 1   Spacer/Aero-Holding Chambers (AEROCHAMBER PLUS) inhaler Use as instructed with inahaler. 1  each 1   triamcinolone (KENALOG) 0.025 % ointment Apply 1 application topically 2 (two) times daily. 90 [name] 0   No current facility-administered medications for this visit.    SURGICAL HISTORY:  Past Surgical History:  Procedure Laterality Date   BIOPSY  01/08/2020   Procedure: BIOPSY;  Surgeon: Garner Nash, DO;  Location: [facility];  Service: Pulmonary;;   BRONCHIAL BRUSHINGS  01/08/2020   Procedure: BRONCHIAL BRUSHINGS;  Surgeon: Garner Nash, DO;  Location: [facility];  Service: Pulmonary;;   BRONCHIAL WASHINGS  01/08/2020   Procedure: BRONCHIAL WASHINGS;  Surgeon: Garner Nash, DO;  Location: [facility];  Service: Pulmonary;;   CARDIAC CATHETERIZATION N/A 12/26/2015   Procedure: Left Heart Cath and Coronary Angiography;  Surgeon: Ander Slade  [name], MD;  Location: [facility];  Service: Cardiovascular;  Laterality: N/A;   CARDIOVERSION  05/2015   CHOLECYSTECTOMY  2012   CORONARY ANGIOPLASTY WITH STENT PLACEMENT  1990   ENDOBRONCHIAL ULTRASOUND  01/08/2020   Procedure: ENDOBRONCHIAL ULTRASOUND;  Surgeon: Garner Nash, DO;  Location: [facility];  Service: Pulmonary;;   FINE NEEDLE ASPIRATION BIOPSY  01/08/2020   Procedure: FINE NEEDLE ASPIRATION BIOPSY;  Surgeon: Garner Nash, DO;  Location: [facility];  Service: Pulmonary;;   HERNIA REPAIR     IR IMAGING GUIDED PORT INSERTION  02/18/2020   PACEMAKER GENERATOR CHANGE  8101,7510, 2016   PACEMAKER INSERTION  1991   VIDEO BRONCHOSCOPY WITH ENDOBRONCHIAL ULTRASOUND N/A 01/08/2020   Procedure: VIDEO BRONCHOSCOPY;  Surgeon: Garner Nash, DO;  Location: [facility];  Service: Pulmonary;  Laterality: N/A;    REVIEW OF SYSTEMS:   Review of Systems  Constitutional: Positive for fatigue and cold natured. Negative for appetite change, chills, fever and unexpected weight change.  HENT: Negative for mouth sores, nosebleeds, sore throat and trouble swallowing.   Eyes: Negative for eye problems and icterus.   Respiratory: Respiratory: Positive for baseline cough and shortness of breath with exertion.  Negative for hemoptysis and wheezing.   Cardiovascular: Negative for chest pain and leg swelling.  Gastrointestinal: Positive for baseline diarrhea. Negative for abdominal pain, constipation, nausea and vomiting. Genitourinary: Negative for bladder incontinence, difficulty urinating, dysuria, frequency and hematuria.   Musculoskeletal: Negative for back pain, gait problem, neck pain and neck stiffness.  Skin: Negative for itching and rash.  Neurological: Negative for dizziness, extremity weakness, gait problem, headaches, light-headedness and seizures.  Hematological: Negative for adenopathy. Does not bruise/bleed easily.  Psychiatric/Behavioral: Negative for confusion, depression and sleep disturbance. The patient is not nervous/anxious.     PHYSICAL EXAMINATION:  There were no vitals taken for this visit.  ECOG PERFORMANCE STATUS: 1-2  Physical Exam  Constitutional: Oriented to person, place, and time and thin appearing female and in no distress. HENT: Head: Normocephalic and atraumatic. Mouth/Throat: Oropharynx is clear and moist. No oropharyngeal exudate. Eyes: Conjunctivae are normal. Right eye exhibits no discharge. Left eye exhibits no discharge. No scleral icterus. Neck: Normal range of motion. Neck supple. Cardiovascular: Normal rate, regular rhythm, normal heart sounds and intact distal pulses.   Pulmonary/Chest: Effort normal. Quiet breath sounds in all lung fields. No respiratory distress. No wheezes. No rales. Abdominal: Soft. Bowel sounds are normal. Exhibits no distension and no mass.  Musculoskeletal: Normal range of motion. Exhibits no edema.  Lymphadenopathy:    No cervical adenopathy.  Neurological: Alert and oriented to person, place, and time. Exhibits normal muscle tone. Examined in the wheelchair.  Skin: A few scattered dry appearing skin lesions on right abdomen and  left shoulder. Skin is warm and dry. Not diaphoretic. No erythema. No pallor.  Psychiatric: Mood, memory and judgment normal. Vitals reviewed.  LABORATORY DATA: Lab Results  Component Value Date   WBC 4.4 04/17/2021   HGB 13.9 04/17/2021   HCT 42.6 04/17/2021   MCV 95.1 04/17/2021   PLT 77 (L) 04/17/2021      Chemistry      Component Value Date/Time   NA 135 04/17/2021 1015   NA 143 03/17/2020 0915   K 4.3 04/17/2021 1015   CL 99 04/17/2021 1015   CO2 31 04/17/2021 1015   BUN 23 04/17/2021 1015   BUN 6 (L) 03/17/2020 0915   CREATININE 0.54 04/17/2021 1015   CREATININE 0.75 03/16/2021  4315      Component Value Date/Time   CALCIUM 9.0 04/17/2021 1015   ALKPHOS 74 04/17/2021 1015   AST 18 04/17/2021 1015   AST 26 03/16/2021 0949   ALT 30 04/17/2021 1015   ALT 29 03/16/2021 0949   BILITOT 0.9 04/17/2021 1015   BILITOT 0.4 03/16/2021 0949       RADIOGRAPHIC STUDIES:  DG Chest Port 1 View  Result Date: 04/17/2021 CLINICAL DATA:  86 year old female with shortness of breath and weakness since cancer treatment last month. EXAM: PORTABLE CHEST 1 VIEW COMPARISON:  Restaging CT Chest, Abdomen, and Pelvis 03/14/2021 and earlier. FINDINGS: Portable AP semi upright view at 1039 hours. Stable right chest cardiac pacemaker. Stable cardiomegaly and mediastinal contours. Stable left chest power port. Mildly lower lung volumes. Lung markings appear stable. No pneumothorax or pleural effusion. No pulmonary edema. Stable visualized osseous structures. Paucity of bowel gas in the upper abdomen. IMPRESSION: Stable.  No acute cardiopulmonary abnormality. Electronically Signed   By: Genevie Ann M.D.   On: 04/17/2021 11:19     ASSESSMENT/PLAN:  This is a very pleasant 86 year old [place] American female with extensive stage small cell lung cancer. She presented with a central right upper lobe lung mass in addition to left lower lobe nodule and mediastinal lymphadenopathy and a metastatic bone lesion  in the left iliac wing. She was diagnosed in April 2021.  She was found to have metastatic disease to the brain in June 2022   The patient is currently undergoing treatment with palliative systemic chemotherapy with carboplatin for an AUC of 5 on day 1, etoposide 100 mg/m2 on days 1, 2, and 3 and Imfinzi 1500 mg every 3 weeks. Starting from cycle #5, the patient has been on maintenance immunotherapy with Imfinzi. She is status post 16 cycles.   The patient completed whole brain radiation for the metastatic disease to the brain under the care of Dr. Isidore Moos which was completed on 03/29/2021. She has a one month follow up coming up soon with Dr. Isidore Moos. She is presently on 2 mg of decadron every other day.   The patient was seen with Dr. Julien Nordmann today. Labs were reviewed. Recommend she resume her treatment with immunotherapy. She will proceed with cycle #17 today as scheduled.   We will see her back for a follow up visit in 4 weeks for evaluation before starting cycle #18.   The patient was advised to call immediately if she has any concerning symptoms in the interval. The patient voices understanding of current disease status and treatment options and is in agreement with the current care plan. All questions were answered. The patient knows to call the clinic with any problems, questions or concerns. We can certainly see the patient much sooner if necessary  No orders of the defined types were placed in this encounter.     Graclyn Lawther L Amritha Yorke, PA-C 05/01/21  ADDENDUM: Hematology/Oncology Attending: I had a face-to-face encounter with the patient today.  I reviewed her records, lab and recommended her care plan.  This is a very pleasant 86 years old African-American female with extensive stage small cell lung cancer diagnosed in April 2021 status post systemic chemotherapy with carboplatin, etoposide and Imfinzi for 4 cycles followed by maintenance treatment with Imfinzi status post 12 more  cycles.  The patient had evidence for disease progression in the brain in June 2022 and she underwent whole brain irradiation.  Her treatment with Imfinzi has been been on hold during the  radiotherapy and the tapered dose of Decadron.  She is currently on 2 mg of Decadron every other day. I recommended for the patient to resume her treatment again with Imfinzi and she will proceed with cycle #17 today. She will come back for follow-up visit in 4 weeks for evaluation before the next cycle of her treatment the patient was advised to call immediately if she has any other concerning symptoms in the interval.  Disclaimer: This [name] was dictated with voice recognition software. Similar sounding words can inadvertently be transcribed and may be missed upon review. Eilleen Kempf, MD 05/04/21

## 2021-05-02 ENCOUNTER — Telehealth: Payer: Self-pay | Admitting: Pharmacist

## 2021-05-02 NOTE — Chronic Care Management (AMB) (Addendum)
Chronic Care Management Pharmacy Assistant   Name: [name]  MRN: [id number] DOB: [birth year]  Reason for Encounter: Disease State Diabetes Assessment Call   Conditions to be addressed/monitored: DMII  Recent office visits:  04-26-2021 [name], Robin G, MD (PCP) - Patient presented for hospital follow up and other concerns. Prescribed ADVAIR [id number] MCG/ACT inhaler and TRADJENTA 5 MG Stopped TRICOR 48 MG  Recent consult visits:   03-29-2021 [name], MD (Rad Oncol.) - Patient presented for final oncology treatment. No medication changes.  Hospital visits:  Medication Reconciliation was completed by comparing discharge summary, patient's EMR and Pharmacy list, and upon discussion with patient.  Patient visited [facility] on 04-17-2021 due to chest pain and weakness. Patient was there for 8 hours  New?Medications Started at [facility] Discharge:?? -started None  Medication Changes at Hospital Discharge: -Changed None  Medications Discontinued at Hospital Discharge: -Stopped None   Medications that remain the same after Hospital Discharge:??  -All other medications will remain the same.    Patient visited [facility] on 04-15-2021 due to Dermatitis, leg swelling and skin condition. Patient was at urgent care for 1 hour.  New?Medications Started at [facility] Discharge:?? -started Kenalog ointment 0.025%  Medication Changes at Hospital Discharge: -Changed None  Medications Discontinued at Hospital Discharge: -Stopped None   Medications that remain the same after Hospital Discharge:??  -All other medications will remain the same.    Medications: Outpatient Encounter Medications as of 05/02/2021  Medication Sig Note   acetaminophen (TYLENOL) 500 MG tablet Take 500 mg by mouth every 6 (six) hours as needed for headache.    albuterol (VENTOLIN HFA) 108 (90 Base) MCG/ACT inhaler TAKE 2 PUFFS BY MOUTH EVERY 6 HOURS AS NEEDED FOR WHEEZE  OR SHORTNESS OF BREATH    atorvastatin (LIPITOR) 10 MG tablet TAKE 1 TABLET BY MOUTH EVERY DAY    carvedilol (COREG) 12.5 MG tablet Take 1 tablet (12.5 mg total) by mouth 2 (two) times daily with a meal.    dexamethasone (DECADRON) 4 MG tablet To start, Take 1 tab twice daily; Then, taper to 1 tab daily on July 18; Then, taper to 1/2 tab daily on Aug 1; Then, taper to 1/2 tab every other day on Aug 8th. Take last dose Aug 20.    diclofenac sodium (VOLTAREN) 1 % GEL Apply 4 g topically 4 (four) times daily.    fluconazole (DIFLUCAN) 100 MG tablet Take 2 tablets by mouth today, then 1 tablet daily x 20 more days. Hold Atorvastatin while on this.    fluticasone-salmeterol (ADVAIR HFA) 115-21 MCG/ACT inhaler Inhale 2 puffs into the lungs 2 (two) times daily.    glucose blood (ONETOUCH VERIO) test strip Use to test 3-4 times daily.    KLOR-CON M20 20 MEQ tablet TAKE 1 TABLET BY MOUTH TWICE A DAY FOR 2 DAYS THEN TAKE 1 TABLET BY MOUTH EVERY DAY    levothyroxine (SYNTHROID) 112 MCG tablet Take 1 tablet (112 mcg total) by mouth daily before breakfast.    lidocaine-prilocaine (EMLA) cream Apply 1 application topically as needed.    linagliptin (TRADJENTA) 5 MG TABS tablet Take 1 tablet (5 mg total) by mouth daily.    Loperamide HCl (IMODIUM PO) Take by mouth as needed. 01/19/2021: Takes liquid imodium and states it works better than the tablet.   losartan (COZAAR) 100 MG tablet Take 1 tablet (100 mg total) by mouth daily.    nicotine (NICODERM CQ - DOSED IN MG/24  HOURS) 14 mg/24hr patch PLACE 1 PATCH ONTO THE SKIN DAILY.    nitroGLYCERIN (NITROSTAT) 0.4 MG SL tablet Place 1 tablet (0.4 mg total) under the tongue every 5 (five) minutes x 3 doses as needed for chest pain (Max 3 doses within 15 min. Call 911).    oxyCODONE-acetaminophen (PERCOCET/ROXICET) 5-325 MG tablet Take 1 tablet by mouth every 8 (eight) hours as needed for severe pain. 02/18/2020: Not started   pantoprazole (PROTONIX) 40 MG tablet TAKE 1  TABLET BY MOUTH EVERY DAY    prochlorperazine (COMPAZINE) 10 MG tablet Take 1 tablet (10 mg total) by mouth every 6 (six) hours as needed for nausea or vomiting.    Spacer/Aero-Holding Chambers (AEROCHAMBER PLUS) inhaler Use as instructed with inahaler.    triamcinolone (KENALOG) 0.025 % ointment Apply 1 application topically 2 (two) times daily.    No facility-administered encounter medications on file as of 05/02/2021.  Recent Relevant Labs: Lab Results  Component Value Date/Time   HGBA1C 8.8 (A) 04/26/2021 08:09 AM   HGBA1C 6.2 01/17/2021 10:18 AM   HGBA1C 5.4 04/27/2020 02:35 PM   HGBA1C 6.5 09/30/2019 08:26 AM   MICROALBUR 6.0 (H) 06/10/2019 09:29 AM   MICROALBUR 4.2 (H) 11/12/2018 09:56 AM    Kidney Function Lab Results  Component Value Date/Time   CREATININE 0.54 04/17/2021 10:15 AM   CREATININE 0.75 03/16/2021 09:49 AM   CREATININE 0.90 02/16/2021 10:09 AM   GFR 117.94 12/15/2019 11:23 AM   GFRNONAA >60 04/17/2021 10:15 AM   GFRNONAA >60 03/16/2021 09:49 AM   GFRAA >60 06/27/2020 09:24 AM    Current antihyperglycemic regimen:  Linagliptin 5mg  once daily What recent interventions/DTPs have been made to improve glycemic control:  Patients daugher reports Tradjenta 5mg  has been added as of yesterday  Have there been any recent hospitalizations or ED visits since last visit with CPP? Yes Patient denies hypoglycemic symptoms. Patients daughter reports none Patient denies hyperglycemic symptoms. Patients daughter reports none How often are you checking your blood sugar? Patients daughter reports they had not been checking but will start once she has the Tradjenta  During the week, how often does your blood glucose drop below 70? Patients daughter reports none Notes:  Spoke to patients daughter Candi Leash, reports patient is doing well had sugar checked a bit ago and was a little high and since then has seen Dr [name] who started her on the Tradjenta she has not taken It yet was  prescribed on yesterday. Advises her moms appetite has been well.  She advised she will be following up with Oncology next week as well. She reports no issues with medications or any other concerns at this time.  Adherence Review: Is the patient currently on a STATIN medication? Yes Is the patient currently on ACE/ARB medication? Yes Does the patient have >5 day gap between last estimated fill dates? No   Care Gaps:  AWV -Scheduled for 08-22-21 CCM F/U Call - Scheduled for 07-15-21 at 12p Zoster vaccine - Overdue Foot Exam - Overdue PRA vaccine - Overdue Flu vaccine - Overdue  Star Rating Drugs: Linagliptin 5mg  - Last filled on 04-28-2021 90DS at [facility] Atorvastatin 10mg  - last filled on 03-16-2021 90DS at [facility] Losartan 100mg  - last filled on 04-21-2021 90DS at [facility] Pharmacist Assistant [phone]

## 2021-05-04 ENCOUNTER — Inpatient Hospital Stay: Payer: Medicare Other

## 2021-05-04 ENCOUNTER — Other Ambulatory Visit: Payer: Self-pay

## 2021-05-04 ENCOUNTER — Inpatient Hospital Stay: Payer: Medicare Other | Attending: Internal Medicine | Admitting: Physician Assistant

## 2021-05-04 ENCOUNTER — Encounter: Payer: Self-pay | Admitting: Physician Assistant

## 2021-05-04 VITALS — BP 105/52 | HR 68 | Temp 97.8°F | Resp 18

## 2021-05-04 DIAGNOSIS — E039 Hypothyroidism, unspecified: Secondary | ICD-10-CM | POA: Diagnosis not present

## 2021-05-04 DIAGNOSIS — C3411 Malignant neoplasm of upper lobe, right bronchus or lung: Secondary | ICD-10-CM | POA: Insufficient documentation

## 2021-05-04 DIAGNOSIS — I480 Paroxysmal atrial fibrillation: Secondary | ICD-10-CM | POA: Diagnosis not present

## 2021-05-04 DIAGNOSIS — C7931 Secondary malignant neoplasm of brain: Secondary | ICD-10-CM | POA: Insufficient documentation

## 2021-05-04 DIAGNOSIS — C3491 Malignant neoplasm of unspecified part of right bronchus or lung: Secondary | ICD-10-CM

## 2021-05-04 DIAGNOSIS — Z5112 Encounter for antineoplastic immunotherapy: Secondary | ICD-10-CM | POA: Insufficient documentation

## 2021-05-04 DIAGNOSIS — C7951 Secondary malignant neoplasm of bone: Secondary | ICD-10-CM | POA: Insufficient documentation

## 2021-05-04 DIAGNOSIS — Z95828 Presence of other vascular implants and grafts: Secondary | ICD-10-CM

## 2021-05-04 LAB — CBC WITH DIFFERENTIAL (CANCER CENTER ONLY)
Abs Immature Granulocytes: 0.38 10*3/uL — ABNORMAL HIGH (ref 0.00–0.07)
Basophils Absolute: 0 10*3/uL (ref 0.0–0.1)
Basophils Relative: 0 %
Eosinophils Absolute: 0 10*3/uL (ref 0.0–0.5)
Eosinophils Relative: 0 %
HCT: 32.4 % — ABNORMAL LOW (ref 36.0–46.0)
Hemoglobin: 10.8 g/dL — ABNORMAL LOW (ref 12.0–15.0)
Immature Granulocytes: 7 %
Lymphocytes Relative: 10 %
Lymphs Abs: 0.6 10*3/uL — ABNORMAL LOW (ref 0.7–4.0)
MCH: 30.9 pg (ref 26.0–34.0)
MCHC: 33.3 g/dL (ref 30.0–36.0)
MCV: 92.8 fL (ref 80.0–100.0)
Monocytes Absolute: 0.5 10*3/uL (ref 0.1–1.0)
Monocytes Relative: 10 %
Neutro Abs: 3.8 10*3/uL (ref 1.7–7.7)
Neutrophils Relative %: 73 %
Platelet Count: 159 10*3/uL (ref 150–400)
RBC: 3.49 MIL/uL — ABNORMAL LOW (ref 3.87–5.11)
RDW: 15.9 % — ABNORMAL HIGH (ref 11.5–15.5)
WBC Count: 5.3 10*3/uL (ref 4.0–10.5)
nRBC: 0.4 % — ABNORMAL HIGH (ref 0.0–0.2)

## 2021-05-04 LAB — CMP (CANCER CENTER ONLY)
ALT: 21 U/L (ref 0–44)
AST: 12 U/L — ABNORMAL LOW (ref 15–41)
Albumin: 2.5 g/dL — ABNORMAL LOW (ref 3.5–5.0)
Alkaline Phosphatase: 73 U/L (ref 38–126)
Anion gap: 8 (ref 5–15)
BUN: 14 mg/dL (ref 8–23)
CO2: 26 mmol/L (ref 22–32)
Calcium: 8.7 mg/dL — ABNORMAL LOW (ref 8.9–10.3)
Chloride: 106 mmol/L (ref 98–111)
Creatinine: 0.63 mg/dL (ref 0.44–1.00)
GFR, Estimated: >60 mL/min (ref >60)
Glucose, Bld: 146 mg/dL — ABNORMAL HIGH (ref 70–99)
Potassium: 4.1 mmol/L (ref 3.5–5.1)
Sodium: 140 mmol/L (ref 135–145)
Total Bilirubin: 0.3 mg/dL (ref 0.3–1.2)
Total Protein: 5.3 g/dL — ABNORMAL LOW (ref 6.5–8.1)

## 2021-05-04 LAB — TSH: TSH: 2.391 u[IU]/mL (ref 0.308–3.960)

## 2021-05-04 MED ORDER — HEPARIN SOD (PORK) LOCK FLUSH 100 UNIT/ML IV SOLN
500.0000 [IU] | Freq: Once | INTRAVENOUS | Status: AC | PRN
  Administered 2021-05-04: 500 [IU]
  Filled 2021-05-04: qty 5

## 2021-05-04 MED ORDER — SODIUM CHLORIDE 0.9% FLUSH
10.0000 mL | INTRAVENOUS | Status: DC | PRN
  Administered 2021-05-04: 10 mL
  Filled 2021-05-04: qty 10

## 2021-05-04 MED ORDER — SODIUM CHLORIDE 0.9% FLUSH
10.0000 mL | Freq: Once | INTRAVENOUS | Status: AC
  Administered 2021-05-04: 10 mL
  Filled 2021-05-04: qty 10

## 2021-05-04 MED ORDER — DURVALUMAB 500 MG/10ML IV SOLN
1500.0000 mg | Freq: Once | INTRAVENOUS | Status: AC
  Administered 2021-05-04: 1500 mg via INTRAVENOUS
  Filled 2021-05-04: qty 30

## 2021-05-04 MED ORDER — SODIUM CHLORIDE 0.9 % IV SOLN
Freq: Once | INTRAVENOUS | Status: AC
  Filled 2021-05-04: qty 250

## 2021-05-04 NOTE — Patient Instructions (Signed)
[facility]  Discharge Instructions: Thank you for choosing [facility] to provide your oncology and hematology care.   If you have a lab appointment with the Cancer Center, please go directly to the Cancer Center and check in at the registration area.   Wear comfortable clothing and clothing appropriate for easy access to any Portacath or PICC line.   We strive to give you quality time with your provider. You may need to reschedule your appointment if you arrive late (15 or more minutes).  Arriving late affects you and other patients whose appointments are after yours.  Also, if you miss three or more appointments without notifying the office, you may be dismissed from the clinic at the provider's discretion.      For prescription refill requests, have your pharmacy contact our office and allow 72 hours for refills to be completed.    Today you received the following chemotherapy and/or immunotherapy agents Imfinzi      To help prevent nausea and vomiting after your treatment, we encourage you to take your nausea medication as directed.  BELOW ARE SYMPTOMS THAT SHOULD BE REPORTED IMMEDIATELY: *FEVER GREATER THAN 100.4 F (38 C) OR HIGHER *CHILLS OR SWEATING *NAUSEA AND VOMITING THAT IS NOT CONTROLLED WITH YOUR NAUSEA MEDICATION *UNUSUAL SHORTNESS OF BREATH *UNUSUAL BRUISING OR BLEEDING *URINARY PROBLEMS (pain or burning when urinating, or frequent urination) *BOWEL PROBLEMS (unusual diarrhea, constipation, pain near the anus) TENDERNESS IN MOUTH AND THROAT WITH OR WITHOUT PRESENCE OF ULCERS (sore throat, sores in mouth, or a toothache) UNUSUAL RASH, SWELLING OR PAIN  UNUSUAL VAGINAL DISCHARGE OR ITCHING   Items with * indicate a potential emergency and should be followed up as soon as possible or go to the Emergency Department if any problems should occur.  Please show the CHEMOTHERAPY ALERT CARD or IMMUNOTHERAPY ALERT CARD at check-in to the  Emergency Department and triage nurse.  Should you have questions after your visit or need to cancel or reschedule your appointment, please contact [facility]  Dept: [phone]  and follow the prompts.  Office hours are 8:00 a.m. to 4:30 p.m. Monday - Friday. Please note that voicemails left after 4:00 p.m. may not be returned until the following business day.  We are closed weekends and major holidays. You have access to a nurse at all times for urgent questions. Please call the main number to the clinic Dept: [phone] and follow the prompts.   For any non-urgent questions, you may also contact your provider using MyChart. We now offer e-Visits for anyone 18 and older to request care online for non-urgent symptoms. For details visit [email or web address].   Also download the MyChart app! Go to the app store, search "MyChart", open the app, select [place], and log in with your MyChart username and password.  Due to Covid, a mask is required upon entering the hospital/clinic. If you do not have a mask, one will be given to you upon arrival. For doctor visits, patients may have 1 support person aged 18 or older with them. For treatment visits, patients cannot have anyone with them due to current Covid guidelines and our immunocompromised population.   

## 2021-05-07 IMAGING — US IR IMAGING GUIDED PORT INSERTION
1 series · 2 of 2 positions shown · non-contrast
Comparison: none

INDICATION: 84-year-old female with right upper lobe bronchogenic carcinoma. She
presents for port catheter placement for durable venous access. She
has a right subclavian approach port catheter, therefore we will
proceed with placement of a left-sided port catheter.

[Series 1: (id) · 2 of 2 slices shown]
[im 1/2]
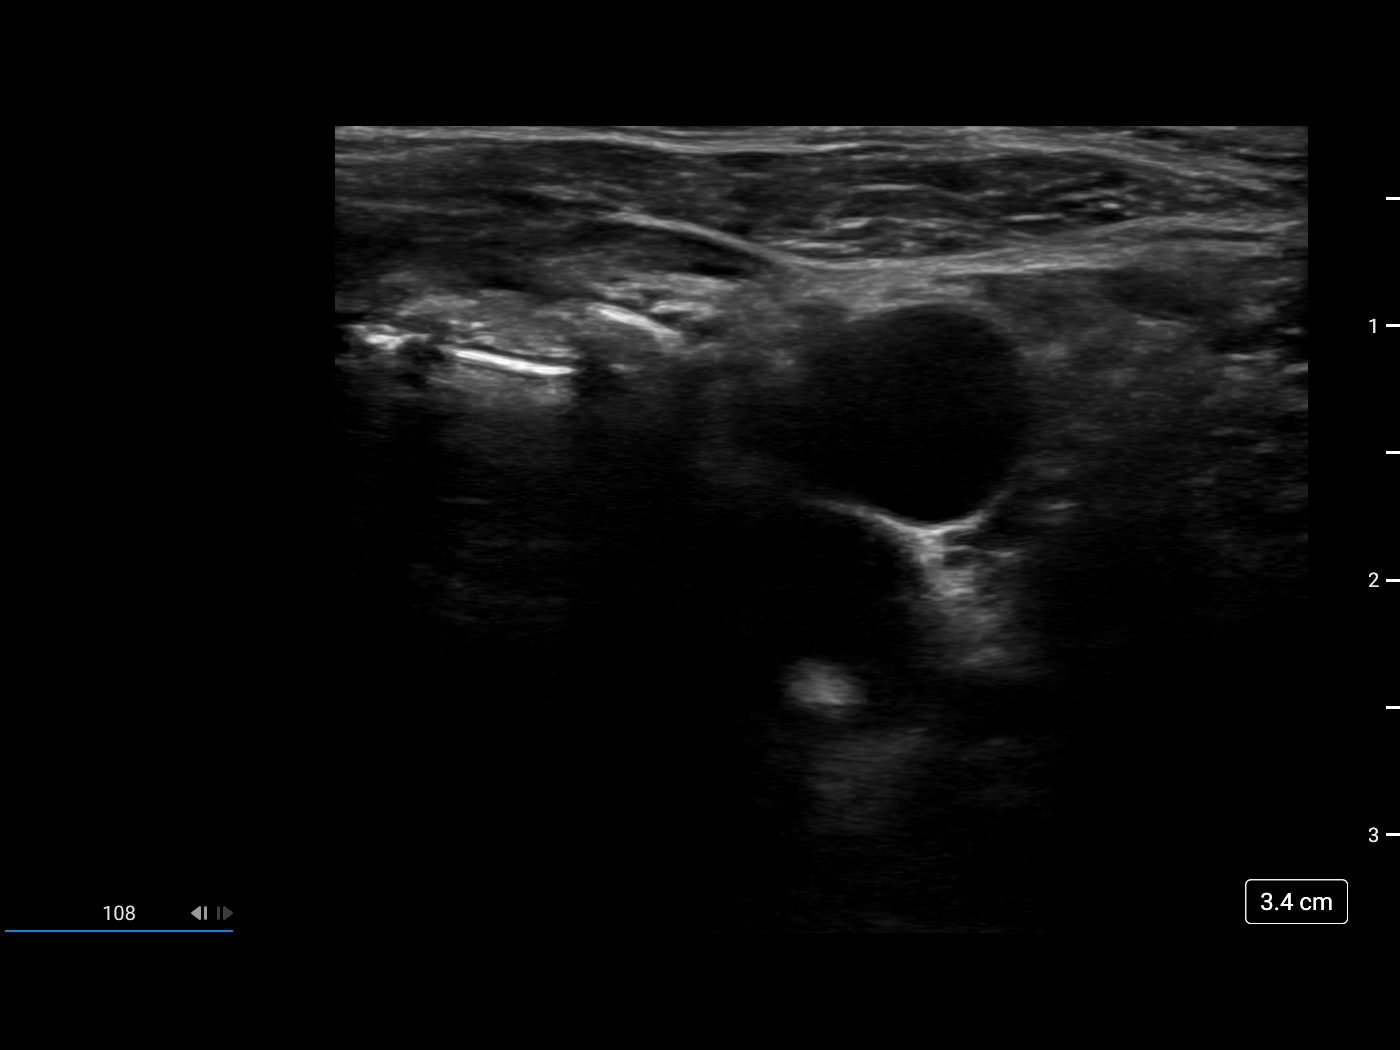
[im 2/2]
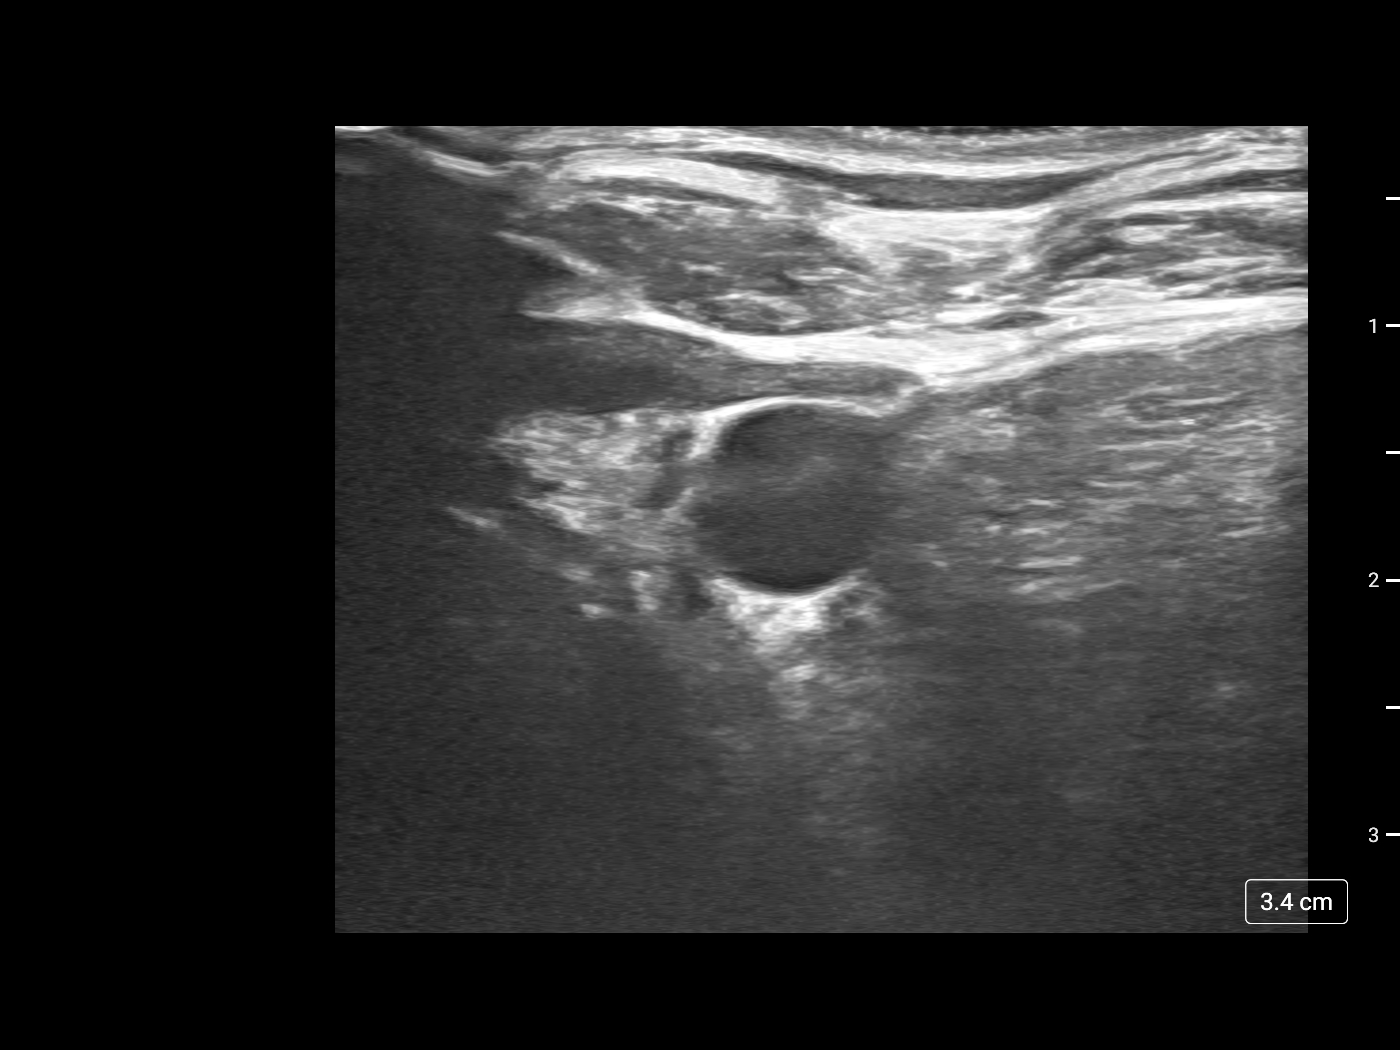

[2 of 2 positions shown; findings below may reference images not displayed]

EXAM:
IMPLANTED PORT A CATH PLACEMENT WITH ULTRASOUND AND FLUOROSCOPIC
GUIDANCE

MEDICATIONS:
2 g Ancef; The antibiotic was administered within an appropriate
time interval prior to skin puncture.

ANESTHESIA/SEDATION:
Versed 2 mg IV; Fentanyl 100 mcg IV;

Moderate Sedation Time:  35 minutes

The patient was continuously monitored during the procedure by the
interventional radiology nurse under my direct supervision.

FLUOROSCOPY TIME:  6 minutes, 12 seconds (80 mGy)

COMPLICATIONS:
None immediate.

PROCEDURE:
The left neck and chest was prepped with chlorhexidine, and draped
in the usual sterile fashion using maximum barrier technique (cap
and mask, sterile gown, sterile gloves, large sterile sheet, hand
hygiene and cutaneous antiseptic). Local anesthesia was attained by
infiltration with 1% lidocaine with epinephrine.

Ultrasound demonstrated patency of the left internal jugular vein
vein, and this was documented with an image. Under real-time
ultrasound guidance, this vein was accessed with a 21 gauge
micropuncture needle and image documentation was performed. A small
dermatotomy was made at the access site with an 11 scalpel. A 0.018"
wire was advanced into the SVC and the access needle exchanged for a
4F micropuncture vascular sheath. The 0.018" wire was then removed
and a 0.035" wire advanced into the IVC. The venous anatomy is
somewhat tortuous and a venogram was performed to identify the path.

An appropriate location for the subcutaneous reservoir was selected
below the clavicle and an incision was made through the skin and
underlying soft tissues. The subcutaneous tissues were then
dissected using a combination of blunt and sharp surgical technique
and a pocket was formed. A single lumen low-profile power injectable
portacatheter was then tunneled through the subcutaneous tissues
from the pocket to the dermatotomy and the port reservoir placed
within the subcutaneous pocket.

The venous access site was then serially dilated and a peel away
vascular sheath placed over the wire. The wire was removed and the
port catheter advanced into position under fluoroscopic guidance.
The catheter tip is positioned in the superior cavoatrial junction.
This was documented with a spot image. The portacatheter was then
tested and found to flush and aspirate well. The port was flushed
with saline followed by 100 units/mL heparinized saline.

The pocket was then closed in two layers using first subdermal
inverted interrupted absorbable sutures followed by a running
subcuticular suture. The epidermis was then sealed with Dermabond.
The dermatotomy at the venous access site was also closed with
Dermabond.
IMPRESSION: Successful placement of a left IJ approach Power Port with
ultrasound and fluoroscopic guidance. The catheter is ready for use.

## 2021-05-10 ENCOUNTER — Ambulatory Visit (INDEPENDENT_AMBULATORY_CARE_PROVIDER_SITE_OTHER): Payer: Medicare Other

## 2021-05-10 DIAGNOSIS — I495 Sick sinus syndrome: Secondary | ICD-10-CM

## 2021-05-10 LAB — CUP PACEART REMOTE DEVICE CHECK
Battery Remaining Longevity: 12 mo
Battery Remaining Percentage: 23 %
Brady Statistic RA Percent Paced: 0 %
Brady Statistic RV Percent Paced: 36 %
Date Time Interrogation Session: 20220810021100
Implantable Lead Implant Date: 19990610
Implantable Lead Implant Date: 20160713
Implantable Lead Location: 753859
Implantable Lead Location: 753860
Implantable Lead Model: 4136
Implantable Lead Serial Number: 29813363
Implantable Pulse Generator Implant Date: 20150130
Lead Channel Impedance Value: 447 Ohm
Lead Channel Impedance Value: 561 Ohm
Lead Channel Pacing Threshold Amplitude: 0.8 V
Lead Channel Pacing Threshold Amplitude: 1.1 V
Lead Channel Pacing Threshold Pulse Width: 0.4 ms
Lead Channel Pacing Threshold Pulse Width: 0.5 ms
Lead Channel Setting Pacing Amplitude: 1.2 V
Lead Channel Setting Pacing Amplitude: 2 V
Lead Channel Setting Pacing Pulse Width: 0.4 ms
Lead Channel Setting Sensing Sensitivity: 2 mV
Pulse Gen Serial Number: 393477

## 2021-05-12 ENCOUNTER — Other Ambulatory Visit: Payer: Self-pay

## 2021-05-12 ENCOUNTER — Other Ambulatory Visit: Payer: Self-pay | Admitting: Family Medicine

## 2021-05-12 ENCOUNTER — Ambulatory Visit
Admission: RE | Admit: 2021-05-12 | Discharge: 2021-05-12 | Disposition: A | Discharge status: Home / Self Care | Payer: Medicare Other | Source: Ambulatory Visit | Attending: Radiation Oncology | Admitting: Radiation Oncology

## 2021-05-12 VITALS — BP 109/47 | HR 42 | Temp 97.2°F | Resp 19 | Wt 98.1 lb

## 2021-05-12 DIAGNOSIS — R0602 Shortness of breath: Secondary | ICD-10-CM | POA: Diagnosis not present

## 2021-05-12 DIAGNOSIS — I119 Hypertensive heart disease without heart failure: Secondary | ICD-10-CM

## 2021-05-12 DIAGNOSIS — Z923 Personal history of irradiation: Secondary | ICD-10-CM | POA: Diagnosis not present

## 2021-05-12 DIAGNOSIS — R531 Weakness: Secondary | ICD-10-CM | POA: Diagnosis not present

## 2021-05-12 DIAGNOSIS — I517 Cardiomegaly: Secondary | ICD-10-CM | POA: Insufficient documentation

## 2021-05-12 DIAGNOSIS — Z950 Presence of cardiac pacemaker: Secondary | ICD-10-CM | POA: Diagnosis not present

## 2021-05-12 DIAGNOSIS — C3411 Malignant neoplasm of upper lobe, right bronchus or lung: Secondary | ICD-10-CM | POA: Insufficient documentation

## 2021-05-12 DIAGNOSIS — B379 Candidiasis, unspecified: Secondary | ICD-10-CM | POA: Diagnosis not present

## 2021-05-12 DIAGNOSIS — Z79899 Other long term (current) drug therapy: Secondary | ICD-10-CM | POA: Insufficient documentation

## 2021-05-12 DIAGNOSIS — C7931 Secondary malignant neoplasm of brain: Secondary | ICD-10-CM | POA: Diagnosis not present

## 2021-05-12 DIAGNOSIS — R143 Flatulence: Secondary | ICD-10-CM | POA: Diagnosis not present

## 2021-05-12 DIAGNOSIS — Z7951 Long term (current) use of inhaled steroids: Secondary | ICD-10-CM | POA: Insufficient documentation

## 2021-05-12 MED ORDER — FLUCONAZOLE 100 MG PO TABS: ORAL_TABLET | ORAL | 0 refills | Status: DC

## 2021-05-12 NOTE — Progress Notes (Signed)
Ms. Utt presents today for follow-up after completing radiation to her brain on 03/29/2021  Dose of Decadron, if applicable: Continuing to taper down. Currently taking 2 mg PO every other day Recent neurologic symptoms, if any:  Seizures: Patient denies Headaches: Reports headaches when she first wakes up in the morning, but states they resolve after she's had something to eat/drink Nausea: Patient denies Wt Readings from Last 3 Encounters:  05/12/21 98 lb 2 oz (44.5 kg)  03/16/21 110 lb 6.4 oz (50.1 kg)  03/16/21 109 lb 6.4 oz (49.6 kg)   Dizziness/ataxia: Continues to experience, and must constantly have something to hold onto to help her feel stable Difficulty with hand coordination: Patient denies, but daughter notes new shakiness in both hands for the past 2 days Focal numbness/weakness: Reports that her legs feel more weak Visual deficits/changes: Denies anything new; continues to deal with blurry vision from cataract in right eye Confusion/Memory deficits: Denies anything new; still reports issues with both short-term and long-term memory.    Additional Complaints / other details:  MedOnc F/U 05/04/2021:  Cassie Heilingoetter, PA-C --The patient is currently undergoing treatment with palliative systemic chemotherapy with carboplatin for an AUC of 5 on day 1, etoposide 100 mg/m2 on days 1, 2, and 3 and Imfinzi 1500 mg every 3 weeks. Starting from cycle #5, the patient has been on maintenance immunotherapy with Imfinzi. She is status post 16 cycles --The patient was seen with Dr. Julien Nordmann today. Labs were reviewed. Recommend she resume her treatment with immunotherapy. She will proceed with cycle #17 today as scheduled.  --We will see her back for a follow up visit in 4 weeks for evaluation before starting cycle #18.   Vitals:   05/12/21 1515  BP: (!) 109/47  Pulse: (!) 42  Resp: 19  Temp: (!) 97.2 F (36.2 C)  SpO2: 93%

## 2021-05-13 ENCOUNTER — Other Ambulatory Visit: Payer: Self-pay | Admitting: Radiation Oncology

## 2021-05-13 DIAGNOSIS — C7931 Secondary malignant neoplasm of brain: Secondary | ICD-10-CM

## 2021-05-13 NOTE — Progress Notes (Signed)
Radiation Oncology         [phone] ________________________________  Name: [name] MRN: [id number]  Date: 05/12/2021  DOB: 1935/07/26  Follow-Up Visit Note  CC: [name], [name], MD  Robin Bears, MD  Diagnosis and Prior Radiotherapy:       ICD-10-CM   1. Metastatic cancer to brain [facility])  C79.31     2. Brain metastases (HCC)  C79.31 fluconazole (DIFLUCAN) 100 MG tablet      CHIEF COMPLAINT:  Here for follow-up and surveillance of brain cancer  Narrative:  The patient returns today for routine follow-up.  [name] presents today for follow-up after completing radiation to her brain on 03/29/2021  Dose of Decadron, if applicable: Continuing to taper down. Currently taking 2 mg PO every other day Recent neurologic symptoms, if any:  Seizures: Patient denies Headaches: Reports headaches when she first wakes up in the morning, but states they resolve after she's had something to eat/drink Nausea: Patient denies Wt Readings from Last 3 Encounters:  05/12/21 98 lb 2 oz (44.5 kg)  03/16/21 110 lb 6.4 oz (50.1 kg)  03/16/21 109 lb 6.4 oz (49.6 kg)   Dizziness/ataxia: Continues to experience, and must constantly have something to hold onto to help her feel stable Difficulty with hand coordination: Patient denies, but daughter notes new shakiness in both hands for the past 2 days Focal numbness/weakness: Reports that her legs feel more weak Visual deficits/changes: Denies anything new; continues to deal with blurry vision from cataract in right eye Confusion/Memory deficits: Denies anything new; still reports issues with both short-term and long-term memory.    Additional Complaints / other details:  MedOnc F/U 05/04/2021:  Robin Heilingoetter, PA-C --The patient is currently undergoing treatment with palliative systemic chemotherapy with carboplatin for an AUC of 5 on day 1, etoposide 100 mg/m2 on days 1, 2, and 3 and Imfinzi 1500 mg every 3 weeks. Starting from cycle  #5, the patient has been on maintenance immunotherapy with Imfinzi. She is status post 16 cycles --The patient was seen with [name] today. Labs were reviewed. Recommend she resume her treatment with immunotherapy. She will proceed with cycle #17 today as scheduled.  --We will see her back for a follow up visit in 4 weeks for evaluation before starting cycle #18.   Vitals:   05/12/21 1515  BP: (!) 109/47  Pulse: (!) 42  Resp: 19  Temp: (!) 97.2 F (36.2 C)  SpO2: 93%                       ALLERGIES:  is allergic to seasonal ic [cholestatin].  Meds: Current Outpatient Medications  Medication Sig Dispense Refill   acetaminophen (TYLENOL) 500 MG tablet Take 500 mg by mouth every 6 (six) hours as needed for headache.     albuterol (VENTOLIN HFA) 108 (90 Base) MCG/ACT inhaler TAKE 2 PUFFS BY MOUTH EVERY 6 HOURS AS NEEDED FOR WHEEZE OR SHORTNESS OF BREATH 1 each 1   atorvastatin (LIPITOR) 10 MG tablet TAKE 1 TABLET BY MOUTH EVERY DAY 90 tablet 0   carvedilol (COREG) 12.5 MG tablet Take 1 tablet (12.5 mg total) by mouth 2 (two) times daily with a meal. 180 tablet 3   dexamethasone (DECADRON) 4 MG tablet To start, Take 1 tab twice daily; Then, taper to 1 tab daily on July 18; Then, taper to 1/2 tab daily on Aug 1; Then, taper to 1/2 tab every other day on Aug 8th. Take last  dose Aug 20. 60 tablet 0   diclofenac sodium (VOLTAREN) 1 % GEL Apply 4 [name] topically 4 (four) times daily. 4 Tube 3   fluconazole (DIFLUCAN) 100 MG tablet Take 2 tablets by mouth today, then 1 tablet daily x 20 more days. Hold Atorvastatin while on this. 22 tablet 0   fluticasone-salmeterol (ADVAIR HFA) 115-21 MCG/ACT inhaler Inhale 2 puffs into the lungs 2 (two) times daily. 12 [name] 4   glucose blood (ONETOUCH VERIO) test strip Use to test 3-4 times daily. 300 strip 4   KLOR-CON M20 20 MEQ tablet TAKE 1 TABLET BY MOUTH TWICE A DAY FOR 2 DAYS THEN TAKE 1 TABLET BY MOUTH EVERY DAY 90 tablet 1   levothyroxine (SYNTHROID) 112  MCG tablet Take 1 tablet (112 mcg total) by mouth daily before breakfast. 90 tablet 2   lidocaine-prilocaine (EMLA) cream Apply 1 application topically as needed. 30 [name] 2   linagliptin (TRADJENTA) 5 MG TABS tablet Take 1 tablet (5 mg total) by mouth daily. 90 tablet 1   Loperamide HCl (IMODIUM PO) Take by mouth as needed.     losartan (COZAAR) 100 MG tablet TAKE 1 TABLET BY MOUTH EVERY DAY 90 tablet 2   nicotine (NICODERM CQ - DOSED IN MG/24 HOURS) 14 mg/24hr patch PLACE 1 PATCH ONTO THE SKIN DAILY. 28 patch 0   nitroGLYCERIN (NITROSTAT) 0.4 MG SL tablet Place 1 tablet (0.4 mg total) under the tongue every 5 (five) minutes x 3 doses as needed for chest pain (Max 3 doses within 15 min. Call 911). 10 tablet 0   oxyCODONE-acetaminophen (PERCOCET/ROXICET) 5-325 MG tablet Take 1 tablet by mouth every 8 (eight) hours as needed for severe pain. 20 tablet 0   pantoprazole (PROTONIX) 40 MG tablet TAKE 1 TABLET BY MOUTH EVERY DAY 90 tablet 3   prochlorperazine (COMPAZINE) 10 MG tablet Take 1 tablet (10 mg total) by mouth every 6 (six) hours as needed for nausea or vomiting. 30 tablet 1   Spacer/Aero-Holding Chambers (AEROCHAMBER PLUS) inhaler Use as instructed with inahaler. 1 each 1   triamcinolone (KENALOG) 0.025 % ointment Apply 1 application topically 2 (two) times daily. 90 [name] 0   No current facility-administered medications for this encounter.    Physical Findings: The patient is in no acute distress. Patient is alert and oriented. Wt Readings from Last 3 Encounters:  05/12/21 98 lb 2 oz (44.5 kg)  03/16/21 110 lb 6.4 oz (50.1 kg)  03/16/21 109 lb 6.4 oz (49.6 kg)    weight is 98 lb 2 oz (44.5 kg). Her tympanic temperature is 97.2 F (36.2 C) (abnormal). Her blood pressure is 109/47 (abnormal) and her pulse is 42 (abnormal). Her respiration is 19 and oxygen saturation is 93%. .  General: Alert and oriented, in no acute distress; in WC HEENT:  Oropharynx is notable for scant thrush Psychiatric:  Judgment and insight are intact. Affect is appropriate.   Lab Findings: Lab Results  Component Value Date   WBC 5.3 05/04/2021   HGB 10.8 (L) 05/04/2021   HCT 32.4 (L) 05/04/2021   MCV 92.8 05/04/2021   PLT 159 05/04/2021    Lab Results  Component Value Date   TSH 2.391 05/04/2021    Radiographic Findings: DG Chest Port 1 View  Result Date: 04/17/2021 CLINICAL DATA:  86 year old female with shortness of breath and weakness since cancer treatment last month. EXAM: PORTABLE CHEST 1 VIEW COMPARISON:  Restaging CT Chest, Abdomen, and Pelvis 03/14/2021 and earlier. FINDINGS: Portable AP semi  upright view at 1039 hours. Stable right chest cardiac pacemaker. Stable cardiomegaly and mediastinal contours. Stable left chest power port. Mildly lower lung volumes. Lung markings appear stable. No pneumothorax or pleural effusion. No pulmonary edema. Stable visualized osseous structures. Paucity of bowel gas in the upper abdomen. IMPRESSION: Stable.  No acute cardiopulmonary abnormality. Electronically Signed   By: Genevie Ann M.D.   On: 04/17/2021 11:19   CUP PACEART REMOTE DEVICE CHECK  Result Date: 05/10/2021 Scheduled remote reviewed. Normal device function.  100% AF, pt. has declined treatment. MV switched vectors 6/22, disabled 7/2. Next remote 91 days. LR   Impression/Plan:     I met with her and her daughter today. She is doing well symptomatically post Whole Brain RT and tolerating steroid taper well. We will resume fluconazole due to recurrent thrush.  She and I spoke about nutritional techniques to deal with weight loss and we will refer her to our dietician.  F/u in 45mow/ CT head, sooner PRN  On date of service, in total, I spent 20 minutes on this encounter. Patient was seen in person. _____________________________________   SEppie Gibson MD

## 2021-05-26 ENCOUNTER — Inpatient Hospital Stay: Payer: Medicare Other | Admitting: Dietician

## 2021-05-26 ENCOUNTER — Encounter: Payer: Self-pay | Admitting: Internal Medicine

## 2021-05-26 NOTE — Progress Notes (Signed)
Nutrition  Patient did not show for scheduled nutrition appointment today.

## 2021-05-26 NOTE — Progress Notes (Signed)
                                                                                                                                                             Patient Name: [name] MRN: [id number] DOB: 07-12-1935 Referring Physician: Curt Bears (Profile Not Attached) Date of Service: 03/29/2021 [facility], [place]                                                        End Of Treatment Note  Diagnoses: C79.31-Secondary malignant neoplasm of brain  Cancer Staging: Stage IV  Intent: Palliative  Radiation Treatment Dates: 03/16/2021 through 03/29/2021 Site Technique Total Dose (Gy) Dose per Fx (Gy) Completed Fx Beam Energies  Brain: Brain_whole Complex 30/30 3 10/10 6X   Narrative: The patient tolerated radiation therapy relatively well.   Plan: The patient will follow-up with radiation oncology in 1 mo.  -----------------------------------  [name], MD

## 2021-06-01 ENCOUNTER — Inpatient Hospital Stay: Payer: Medicare Other

## 2021-06-01 ENCOUNTER — Encounter: Payer: Self-pay | Admitting: Internal Medicine

## 2021-06-01 ENCOUNTER — Inpatient Hospital Stay: Payer: Medicare Other | Attending: Internal Medicine | Admitting: Internal Medicine

## 2021-06-01 ENCOUNTER — Other Ambulatory Visit: Payer: Self-pay

## 2021-06-01 ENCOUNTER — Encounter: Payer: Medicare Other | Admitting: Nutrition

## 2021-06-01 VITALS — BP 113/46 | HR 72 | Temp 97.7°F | Resp 18 | Ht 62.0 in | Wt 103.9 lb

## 2021-06-01 DIAGNOSIS — E039 Hypothyroidism, unspecified: Secondary | ICD-10-CM | POA: Insufficient documentation

## 2021-06-01 DIAGNOSIS — C7931 Secondary malignant neoplasm of brain: Secondary | ICD-10-CM | POA: Diagnosis not present

## 2021-06-01 DIAGNOSIS — C3491 Malignant neoplasm of unspecified part of right bronchus or lung: Secondary | ICD-10-CM

## 2021-06-01 DIAGNOSIS — C3490 Malignant neoplasm of unspecified part of unspecified bronchus or lung: Secondary | ICD-10-CM

## 2021-06-01 DIAGNOSIS — C3411 Malignant neoplasm of upper lobe, right bronchus or lung: Secondary | ICD-10-CM | POA: Insufficient documentation

## 2021-06-01 DIAGNOSIS — Z95828 Presence of other vascular implants and grafts: Secondary | ICD-10-CM

## 2021-06-01 DIAGNOSIS — Z79899 Other long term (current) drug therapy: Secondary | ICD-10-CM | POA: Diagnosis not present

## 2021-06-01 DIAGNOSIS — Z5112 Encounter for antineoplastic immunotherapy: Secondary | ICD-10-CM | POA: Diagnosis not present

## 2021-06-01 DIAGNOSIS — E119 Type 2 diabetes mellitus without complications: Secondary | ICD-10-CM | POA: Diagnosis not present

## 2021-06-01 DIAGNOSIS — C7951 Secondary malignant neoplasm of bone: Secondary | ICD-10-CM | POA: Insufficient documentation

## 2021-06-01 DIAGNOSIS — I480 Paroxysmal atrial fibrillation: Secondary | ICD-10-CM | POA: Diagnosis not present

## 2021-06-01 DIAGNOSIS — I10 Essential (primary) hypertension: Secondary | ICD-10-CM | POA: Diagnosis not present

## 2021-06-01 LAB — CBC WITH DIFFERENTIAL (CANCER CENTER ONLY)
Abs Immature Granulocytes: 0.02 10*3/uL (ref 0.00–0.07)
Basophils Absolute: 0 10*3/uL (ref 0.0–0.1)
Basophils Relative: 1 %
Eosinophils Absolute: 0.1 10*3/uL (ref 0.0–0.5)
Eosinophils Relative: 2 %
HCT: 32.6 % — ABNORMAL LOW (ref 36.0–46.0)
Hemoglobin: 10.4 g/dL — ABNORMAL LOW (ref 12.0–15.0)
Immature Granulocytes: 0 %
Lymphocytes Relative: 27 %
Lymphs Abs: 1.2 10*3/uL (ref 0.7–4.0)
MCH: 31.1 pg (ref 26.0–34.0)
MCHC: 31.9 g/dL (ref 30.0–36.0)
MCV: 97.6 fL (ref 80.0–100.0)
Monocytes Absolute: 0.6 10*3/uL (ref 0.1–1.0)
Monocytes Relative: 14 %
Neutro Abs: 2.5 10*3/uL (ref 1.7–7.7)
Neutrophils Relative %: 56 %
Platelet Count: 156 10*3/uL (ref 150–400)
RBC: 3.34 MIL/uL — ABNORMAL LOW (ref 3.87–5.11)
RDW: 14.9 % (ref 11.5–15.5)
WBC Count: 4.5 10*3/uL (ref 4.0–10.5)
nRBC: 0 % (ref 0.0–0.2)

## 2021-06-01 LAB — CMP (CANCER CENTER ONLY)
ALT: 7 U/L (ref 0–44)
AST: 12 U/L — ABNORMAL LOW (ref 15–41)
Albumin: 2.9 g/dL — ABNORMAL LOW (ref 3.5–5.0)
Alkaline Phosphatase: 43 U/L (ref 38–126)
Anion gap: 5 (ref 5–15)
BUN: 10 mg/dL (ref 8–23)
CO2: 27 mmol/L (ref 22–32)
Calcium: 8.7 mg/dL — ABNORMAL LOW (ref 8.9–10.3)
Chloride: 112 mmol/L — ABNORMAL HIGH (ref 98–111)
Creatinine: 0.56 mg/dL (ref 0.44–1.00)
GFR, Estimated: >60 mL/min (ref >60)
Glucose, Bld: 99 mg/dL (ref 70–99)
Potassium: 3.4 mmol/L — ABNORMAL LOW (ref 3.5–5.1)
Sodium: 144 mmol/L (ref 135–145)
Total Bilirubin: 0.6 mg/dL (ref 0.3–1.2)
Total Protein: 5.4 g/dL — ABNORMAL LOW (ref 6.5–8.1)

## 2021-06-01 LAB — TSH: TSH: 0.358 u[IU]/mL (ref 0.308–3.960)

## 2021-06-01 MED ORDER — SODIUM CHLORIDE 0.9% FLUSH
10.0000 mL | INTRAVENOUS | Status: DC | PRN
  Administered 2021-06-01: 10 mL

## 2021-06-01 MED ORDER — SODIUM CHLORIDE 0.9 % IV SOLN
1500.0000 mg | Freq: Once | INTRAVENOUS | Status: AC
  Administered 2021-06-01: 1500 mg via INTRAVENOUS
  Filled 2021-06-01: qty 30

## 2021-06-01 MED ORDER — SODIUM CHLORIDE 0.9 % IV SOLN: Freq: Once | INTRAVENOUS | Status: AC

## 2021-06-01 MED ORDER — HEPARIN SOD (PORK) LOCK FLUSH 100 UNIT/ML IV SOLN
500.0000 [IU] | Freq: Once | INTRAVENOUS | Status: AC | PRN
  Administered 2021-06-01: 500 [IU]

## 2021-06-01 MED ORDER — SODIUM CHLORIDE 0.9% FLUSH
10.0000 mL | Freq: Once | INTRAVENOUS | Status: AC
  Administered 2021-06-01: 10 mL

## 2021-06-01 NOTE — Patient Instructions (Signed)
[facility]   Discharge Instructions: Thank you for choosing [facility] to provide your oncology and hematology care.   If you have a lab appointment with the Cancer Center, please go directly to the Cancer Center and check in at the registration area.   Wear comfortable clothing and clothing appropriate for easy access to any Portacath or PICC line.   We strive to give you quality time with your provider. You may need to reschedule your appointment if you arrive late (15 or more minutes).  Arriving late affects you and other patients whose appointments are after yours.  Also, if you miss three or more appointments without notifying the office, you may be dismissed from the clinic at the provider's discretion.      For prescription refill requests, have your pharmacy contact our office and allow 72 hours for refills to be completed.    Today you received the following chemotherapy and/or immunotherapy agents: Durvalumab (Imfinzi).      To help prevent nausea and vomiting after your treatment, we encourage you to take your nausea medication as directed.  BELOW ARE SYMPTOMS THAT SHOULD BE REPORTED IMMEDIATELY: *FEVER GREATER THAN 100.4 F (38 C) OR HIGHER *CHILLS OR SWEATING *NAUSEA AND VOMITING THAT IS NOT CONTROLLED WITH YOUR NAUSEA MEDICATION *UNUSUAL SHORTNESS OF BREATH *UNUSUAL BRUISING OR BLEEDING *URINARY PROBLEMS (pain or burning when urinating, or frequent urination) *BOWEL PROBLEMS (unusual diarrhea, constipation, pain near the anus) TENDERNESS IN MOUTH AND THROAT WITH OR WITHOUT PRESENCE OF ULCERS (sore throat, sores in mouth, or a toothache) UNUSUAL RASH, SWELLING OR PAIN  UNUSUAL VAGINAL DISCHARGE OR ITCHING   Items with * indicate a potential emergency and should be followed up as soon as possible or go to the Emergency Department if any problems should occur.  Please show the CHEMOTHERAPY ALERT CARD or IMMUNOTHERAPY ALERT CARD  at check-in to the Emergency Department and triage nurse.  Should you have questions after your visit or need to cancel or reschedule your appointment, please contact [facility]  Dept: [phone]  and follow the prompts.  Office hours are 8:00 a.m. to 4:30 p.m. Monday - Friday. Please note that voicemails left after 4:00 p.m. may not be returned until the following business day.  We are closed weekends and major holidays. You have access to a nurse at all times for urgent questions. Please call the main number to the clinic Dept: [phone] and follow the prompts.   For any non-urgent questions, you may also contact your provider using MyChart. We now offer e-Visits for anyone 18 and older to request care online for non-urgent symptoms. For details visit [email or web address].   Also download the MyChart app! Go to the app store, search "MyChart", open the app, select [place], and log in with your MyChart username and password.  Due to Covid, a mask is required upon entering the hospital/clinic. If you do not have a mask, one will be given to you upon arrival. For doctor visits, patients may have 1 support person aged 18 or older with them. For treatment visits, patients cannot have anyone with them due to current Covid guidelines and our immunocompromised population.   

## 2021-06-01 NOTE — Progress Notes (Signed)
[facility] [phone]   [phone]  OFFICE PROGRESS NOTE  [name], Robin G, MD [address] [place] [address]  DIAGNOSIS: Extensive stage (T2 a, N2, M1 C) small cell lung cancer presented with central right upper lobe lung mass in addition to left lower lobe pulmonary metastasis and bilateral hilar, subcarinal and bilateral paratracheal and left prevascular lymphadenopathy in addition to bone metastasis in the left iliac wing diagnosed in April 2021.  PRIOR THERAPY: Whole brain irradiation under the care of Dr. Isidore Moos completed on 03/29/2021.  CURRENT THERAPY: Systemic chemotherapy with carboplatin for AUC of 5 on day 1, etoposide 100 mg/M2 on days 1, 2 and 3 with Neulasta support in addition to Imfinzi 1500 mg IV every 3 weeks during chemotherapy followed by maintenance Imfinzi 1500 mg IV every 4 weeks after cycle #4.  Status post 17 cycles.  INTERVAL HISTORY: [name] 86 y.o. female returns to the clinic today for follow-up visit accompanied by her daughter.  The patient is feeling fine today with no concerning complaints except for mild fatigue and weakness.  She also has occasional diarrhea after food.  She had 1 episode of nausea improved with Compazine.  She denied having any current chest pain, shortness of breath, cough or hemoptysis.  She denied having any fever or chills.  She has no nausea, vomiting, diarrhea or constipation.  She has no headache or visual changes.  She is here today for evaluation before starting cycle #18 of her treatment.   MEDICAL HISTORY: Past Medical History:  Diagnosis Date   CAD (coronary artery disease), native coronary artery    PTCA of distal right coronary artery 1997 Cypher stents to circumflex 2005 Cardiac cath in 2011 with patent stent to circumflex and moderate disease elsewhere treated medically    Cancer ([facility])    Cardiac pacemaker in situ 12/24/2015   Original implant reportedly in 1991 for  tachybradycardia syndrome, generator change in 1999, 2004 and evidently again in 2016 in [place]    COPD (chronic obstructive pulmonary disease) ([facility])    Diabetes mellitus without complication ([facility])    Hypertension    Hypothyroidism 03/23/2010   Pacemaker    Paroxysmal atrial fibrillation ([facility]) 03/23/2010   CHA2DS2VASC score 5     ALLERGIES:  is allergic to seasonal ic [cholestatin].  MEDICATIONS:  Current Outpatient Medications  Medication Sig Dispense Refill   acetaminophen (TYLENOL) 500 MG tablet Take 500 mg by mouth every 6 (six) hours as needed for headache.     albuterol (VENTOLIN HFA) 108 (90 Base) MCG/ACT inhaler TAKE 2 PUFFS BY MOUTH EVERY 6 HOURS AS NEEDED FOR WHEEZE OR SHORTNESS OF BREATH 1 each 1   atorvastatin (LIPITOR) 10 MG tablet TAKE 1 TABLET BY MOUTH EVERY DAY 90 tablet 0   carvedilol (COREG) 12.5 MG tablet Take 1 tablet (12.5 mg total) by mouth 2 (two) times daily with a meal. 180 tablet 3   dexamethasone (DECADRON) 4 MG tablet To start, Take 1 tab twice daily; Then, taper to 1 tab daily on July 18; Then, taper to 1/2 tab daily on Aug 1; Then, taper to 1/2 tab every other day on Aug 8th. Take last dose Aug 20. 60 tablet 0   diclofenac sodium (VOLTAREN) 1 % GEL Apply 4 g topically 4 (four) times daily. 4 Tube 3   fluconazole (DIFLUCAN) 100 MG tablet Take 2 tablets by mouth today, then 1 tablet daily x 20 more days. Hold Atorvastatin  while on this. 22 tablet 0   fluticasone-salmeterol (ADVAIR HFA) 115-21 MCG/ACT inhaler Inhale 2 puffs into the lungs 2 (two) times daily. 12 g 4   glucose blood (ONETOUCH VERIO) test strip Use to test 3-4 times daily. 300 strip 4   KLOR-CON M20 20 MEQ tablet TAKE 1 TABLET BY MOUTH TWICE A DAY FOR 2 DAYS THEN TAKE 1 TABLET BY MOUTH EVERY DAY 90 tablet 1   levothyroxine (SYNTHROID) 112 MCG tablet Take 1 tablet (112 mcg total) by mouth daily before breakfast. 90 tablet 2   lidocaine-prilocaine (EMLA) cream Apply 1 application topically as  needed. 30 g 2   linagliptin (TRADJENTA) 5 MG TABS tablet Take 1 tablet (5 mg total) by mouth daily. 90 tablet 1   Loperamide HCl (IMODIUM PO) Take by mouth as needed.     losartan (COZAAR) 100 MG tablet TAKE 1 TABLET BY MOUTH EVERY DAY 90 tablet 2   nicotine (NICODERM CQ - DOSED IN MG/24 HOURS) 14 mg/24hr patch PLACE 1 PATCH ONTO THE SKIN DAILY. 28 patch 0   nitroGLYCERIN (NITROSTAT) 0.4 MG SL tablet Place 1 tablet (0.4 mg total) under the tongue every 5 (five) minutes x 3 doses as needed for chest pain (Max 3 doses within 15 min. Call 911). 10 tablet 0   oxyCODONE-acetaminophen (PERCOCET/ROXICET) 5-325 MG tablet Take 1 tablet by mouth every 8 (eight) hours as needed for severe pain. 20 tablet 0   pantoprazole (PROTONIX) 40 MG tablet TAKE 1 TABLET BY MOUTH EVERY DAY 90 tablet 3   prochlorperazine (COMPAZINE) 10 MG tablet Take 1 tablet (10 mg total) by mouth every 6 (six) hours as needed for nausea or vomiting. 30 tablet 1   Spacer/Aero-Holding Chambers (AEROCHAMBER PLUS) inhaler Use as instructed with inahaler. 1 each 1   triamcinolone (KENALOG) 0.025 % ointment Apply 1 application topically 2 (two) times daily. 90 g 0   No current facility-administered medications for this visit.    SURGICAL HISTORY:  Past Surgical History:  Procedure Laterality Date   BIOPSY  01/08/2020   Procedure: BIOPSY;  Surgeon: Garner Nash, DO;  Location: [facility];  Service: Pulmonary;;   BRONCHIAL BRUSHINGS  01/08/2020   Procedure: BRONCHIAL BRUSHINGS;  Surgeon: Garner Nash, DO;  Location: [facility];  Service: Pulmonary;;   BRONCHIAL WASHINGS  01/08/2020   Procedure: BRONCHIAL WASHINGS;  Surgeon: Garner Nash, DO;  Location: [facility];  Service: Pulmonary;;   CARDIAC CATHETERIZATION N/A 12/26/2015   Procedure: Left Heart Cath and Coronary Angiography;  Surgeon: Peter M [name], MD;  Location: [facility];  Service: Cardiovascular;  Laterality: N/A;   CARDIOVERSION  05/2015   CHOLECYSTECTOMY   2012   CORONARY ANGIOPLASTY WITH STENT PLACEMENT  1990   ENDOBRONCHIAL ULTRASOUND  01/08/2020   Procedure: ENDOBRONCHIAL ULTRASOUND;  Surgeon: Garner Nash, DO;  Location: [facility];  Service: Pulmonary;;   FINE NEEDLE ASPIRATION BIOPSY  01/08/2020   Procedure: FINE NEEDLE ASPIRATION BIOPSY;  Surgeon: Garner Nash, DO;  Location: [facility];  Service: Pulmonary;;   HERNIA REPAIR     IR IMAGING GUIDED PORT INSERTION  02/18/2020   PACEMAKER GENERATOR CHANGE  [id number],8099, 2016   PACEMAKER INSERTION  1991   VIDEO BRONCHOSCOPY WITH ENDOBRONCHIAL ULTRASOUND N/A 01/08/2020   Procedure: VIDEO BRONCHOSCOPY;  Surgeon: Garner Nash, DO;  Location: [facility];  Service: Pulmonary;  Laterality: N/A;    REVIEW OF SYSTEMS:  A comprehensive review of systems was negative except for: Constitutional: positive for fatigue  Gastrointestinal: positive for diarrhea and nausea   PHYSICAL EXAMINATION: General appearance: alert, cooperative, appears stated age, fatigued, and no distress Head: Normocephalic, without obvious abnormality, atraumatic Neck: no adenopathy, no JVD, supple, symmetrical, trachea midline, and thyroid not enlarged, symmetric, no tenderness/mass/nodules Lymph nodes: Cervical, supraclavicular, and axillary nodes normal. Resp: clear to auscultation bilaterally Back: symmetric, no curvature. ROM normal. No CVA tenderness. Cardio: regular rate and rhythm, S1, S2 normal, no murmur, click, rub or gallop GI: soft, non-tender; bowel sounds normal; no masses,  no organomegaly Extremities: extremities normal, atraumatic, no cyanosis or edema  ECOG PERFORMANCE STATUS: 1 - Symptomatic but completely ambulatory  Blood pressure (!) 113/46, pulse 72, temperature 97.7 F (36.5 C), temperature source Tympanic, resp. rate 18, height 5\' 2"  (1.575 m), weight 103 lb 14.4 oz (47.1 kg), SpO2 100 %.  LABORATORY DATA: Lab Results  Component Value Date   WBC 5.3 05/04/2021   HGB 10.8 (L) 05/04/2021    HCT 32.4 (L) 05/04/2021   MCV 92.8 05/04/2021   PLT 159 05/04/2021      Chemistry      Component Value Date/Time   NA 140 05/04/2021 0945   NA 143 03/17/2020 0915   K 4.1 05/04/2021 0945   CL 106 05/04/2021 0945   CO2 26 05/04/2021 0945   BUN 14 05/04/2021 0945   BUN 6 (L) 03/17/2020 0915   CREATININE 0.63 05/04/2021 0945      Component Value Date/Time   CALCIUM 8.7 (L) 05/04/2021 0945   ALKPHOS 73 05/04/2021 0945   AST 12 (L) 05/04/2021 0945   ALT 21 05/04/2021 0945   BILITOT 0.3 05/04/2021 0945       RADIOGRAPHIC STUDIES: CUP PACEART REMOTE DEVICE CHECK  Result Date: 05/10/2021 Scheduled remote reviewed. Normal device function.  100% AF, pt. has declined treatment. MV switched vectors 6/22, disabled 7/2. Next remote 91 days. LR    ASSESSMENT AND PLAN: This is a very pleasant 86 years old African-American female recently diagnosed with extensive stage small cell lung cancer presented with central right upper lobe lung mass in addition to left lower lobe lung nodule and mediastinal lymphadenopathy and metastatic bone lesion in the left iliac wing diagnosed in April 2021. The patient started her treatment today with carboplatin for AUC of 5 on day 1, etoposide 100 mg/M2 on days 1, 2 and 3 with Neulasta support in addition to Imfinzi 1500 mg every 3 weeks during the chemotherapy course followed by 1500 mg every 4 weeks as maintenance. Status post 17 cycles.   The patient continues to tolerate her treatment well with no concerning adverse effects. She was treated for metastatic brain lesion 2 months ago and tolerated it well. I recommended for the patient to proceed with cycle #18 today as planned. I will see her back for follow-up visit in 4 weeks for evaluation with repeat CT scan of the chest, abdomen pelvis for restaging of her disease. The patient was advised to call immediately if she has any concerning symptoms in the interval. The patient voices understanding of  current disease status and treatment options and is in agreement with the current care plan.  All questions were answered. The patient knows to call the clinic with any problems, questions or concerns. We can certainly see the patient much sooner if necessary.  Disclaimer: This note was dictated with voice recognition software. Similar sounding words can inadvertently be transcribed and may not be corrected upon review.

## 2021-06-01 NOTE — Progress Notes (Signed)
Remote pacemaker transmission.   

## 2021-06-02 ENCOUNTER — Other Ambulatory Visit: Payer: Self-pay | Admitting: Radiation Therapy

## 2021-06-02 DIAGNOSIS — C7931 Secondary malignant neoplasm of brain: Secondary | ICD-10-CM

## 2021-06-07 ENCOUNTER — Other Ambulatory Visit: Payer: Self-pay | Admitting: Interventional Cardiology

## 2021-06-12 ENCOUNTER — Other Ambulatory Visit: Payer: Self-pay | Admitting: Radiation Therapy

## 2021-06-14 ENCOUNTER — Telehealth: Payer: Self-pay

## 2021-06-14 NOTE — Telephone Encounter (Signed)
Pts daughter, Kalman Shan, [place] requesting a return call.    I have returned her call and left her a message.

## 2021-06-21 ENCOUNTER — Encounter (HOSPITAL_COMMUNITY): Payer: Self-pay | Admitting: Internal Medicine

## 2021-06-21 ENCOUNTER — Inpatient Hospital Stay (HOSPITAL_COMMUNITY)
Admission: EM | Admit: 2021-06-21 | Discharge: 2021-06-23 | DRG: 640 | Disposition: A | Discharge status: Home Health | Payer: Medicare Other | Attending: Internal Medicine | Admitting: Internal Medicine

## 2021-06-21 ENCOUNTER — Observation Stay (HOSPITAL_COMMUNITY): Payer: Medicare Other

## 2021-06-21 ENCOUNTER — Emergency Department (HOSPITAL_COMMUNITY): Payer: Medicare Other

## 2021-06-21 DIAGNOSIS — I5041 Acute combined systolic (congestive) and diastolic (congestive) heart failure: Secondary | ICD-10-CM | POA: Diagnosis not present

## 2021-06-21 DIAGNOSIS — K529 Noninfective gastroenteritis and colitis, unspecified: Secondary | ICD-10-CM | POA: Diagnosis not present

## 2021-06-21 DIAGNOSIS — I493 Ventricular premature depolarization: Secondary | ICD-10-CM | POA: Diagnosis present

## 2021-06-21 DIAGNOSIS — Z9221 Personal history of antineoplastic chemotherapy: Secondary | ICD-10-CM

## 2021-06-21 DIAGNOSIS — I110 Hypertensive heart disease with heart failure: Secondary | ICD-10-CM | POA: Diagnosis not present

## 2021-06-21 DIAGNOSIS — D630 Anemia in neoplastic disease: Secondary | ICD-10-CM | POA: Diagnosis present

## 2021-06-21 DIAGNOSIS — Z950 Presence of cardiac pacemaker: Secondary | ICD-10-CM | POA: Diagnosis not present

## 2021-06-21 DIAGNOSIS — Z66 Do not resuscitate: Secondary | ICD-10-CM | POA: Diagnosis not present

## 2021-06-21 DIAGNOSIS — C3491 Malignant neoplasm of unspecified part of right bronchus or lung: Secondary | ICD-10-CM | POA: Diagnosis present

## 2021-06-21 DIAGNOSIS — E038 Other specified hypothyroidism: Secondary | ICD-10-CM

## 2021-06-21 DIAGNOSIS — Z20822 Contact with and (suspected) exposure to covid-19: Secondary | ICD-10-CM | POA: Diagnosis not present

## 2021-06-21 DIAGNOSIS — E1169 Type 2 diabetes mellitus with other specified complication: Secondary | ICD-10-CM | POA: Diagnosis present

## 2021-06-21 DIAGNOSIS — R627 Adult failure to thrive: Secondary | ICD-10-CM | POA: Diagnosis present

## 2021-06-21 DIAGNOSIS — R531 Weakness: Secondary | ICD-10-CM | POA: Diagnosis present

## 2021-06-21 DIAGNOSIS — R9431 Abnormal electrocardiogram [ECG] [EKG]: Secondary | ICD-10-CM | POA: Diagnosis not present

## 2021-06-21 DIAGNOSIS — N281 Cyst of kidney, acquired: Secondary | ICD-10-CM | POA: Diagnosis not present

## 2021-06-21 DIAGNOSIS — I517 Cardiomegaly: Secondary | ICD-10-CM | POA: Diagnosis not present

## 2021-06-21 DIAGNOSIS — I4821 Permanent atrial fibrillation: Secondary | ICD-10-CM | POA: Diagnosis not present

## 2021-06-21 DIAGNOSIS — R778 Other specified abnormalities of plasma proteins: Secondary | ICD-10-CM | POA: Diagnosis present

## 2021-06-21 DIAGNOSIS — E876 Hypokalemia: Principal | ICD-10-CM | POA: Diagnosis present

## 2021-06-21 DIAGNOSIS — Z79899 Other long term (current) drug therapy: Secondary | ICD-10-CM

## 2021-06-21 DIAGNOSIS — R059 Cough, unspecified: Secondary | ICD-10-CM | POA: Diagnosis not present

## 2021-06-21 DIAGNOSIS — R0600 Dyspnea, unspecified: Secondary | ICD-10-CM

## 2021-06-21 DIAGNOSIS — D61810 Antineoplastic chemotherapy induced pancytopenia: Secondary | ICD-10-CM | POA: Diagnosis not present

## 2021-06-21 DIAGNOSIS — K219 Gastro-esophageal reflux disease without esophagitis: Secondary | ICD-10-CM | POA: Diagnosis not present

## 2021-06-21 DIAGNOSIS — E785 Hyperlipidemia, unspecified: Secondary | ICD-10-CM | POA: Diagnosis not present

## 2021-06-21 DIAGNOSIS — Z8249 Family history of ischemic heart disease and other diseases of the circulatory system: Secondary | ICD-10-CM

## 2021-06-21 DIAGNOSIS — Z681 Body mass index (BMI) 19 or less, adult: Secondary | ICD-10-CM

## 2021-06-21 DIAGNOSIS — C7951 Secondary malignant neoplasm of bone: Secondary | ICD-10-CM | POA: Diagnosis not present

## 2021-06-21 DIAGNOSIS — R6889 Other general symptoms and signs: Secondary | ICD-10-CM | POA: Diagnosis not present

## 2021-06-21 DIAGNOSIS — Z79890 Hormone replacement therapy (postmenopausal): Secondary | ICD-10-CM

## 2021-06-21 DIAGNOSIS — C7931 Secondary malignant neoplasm of brain: Secondary | ICD-10-CM | POA: Diagnosis not present

## 2021-06-21 DIAGNOSIS — I480 Paroxysmal atrial fibrillation: Secondary | ICD-10-CM | POA: Diagnosis present

## 2021-06-21 DIAGNOSIS — I5021 Acute systolic (congestive) heart failure: Secondary | ICD-10-CM | POA: Diagnosis not present

## 2021-06-21 DIAGNOSIS — I2510 Atherosclerotic heart disease of native coronary artery without angina pectoris: Secondary | ICD-10-CM | POA: Diagnosis not present

## 2021-06-21 DIAGNOSIS — I495 Sick sinus syndrome: Secondary | ICD-10-CM | POA: Diagnosis present

## 2021-06-21 DIAGNOSIS — F17210 Nicotine dependence, cigarettes, uncomplicated: Secondary | ICD-10-CM | POA: Diagnosis present

## 2021-06-21 DIAGNOSIS — K5790 Diverticulosis of intestine, part unspecified, without perforation or abscess without bleeding: Secondary | ICD-10-CM | POA: Diagnosis not present

## 2021-06-21 DIAGNOSIS — Z7984 Long term (current) use of oral hypoglycemic drugs: Secondary | ICD-10-CM

## 2021-06-21 DIAGNOSIS — E039 Hypothyroidism, unspecified: Secondary | ICD-10-CM | POA: Diagnosis present

## 2021-06-21 DIAGNOSIS — Z743 Need for continuous supervision: Secondary | ICD-10-CM | POA: Diagnosis not present

## 2021-06-21 DIAGNOSIS — I700 Atherosclerosis of aorta: Secondary | ICD-10-CM | POA: Diagnosis not present

## 2021-06-21 DIAGNOSIS — T451X5A Adverse effect of antineoplastic and immunosuppressive drugs, initial encounter: Secondary | ICD-10-CM | POA: Diagnosis present

## 2021-06-21 DIAGNOSIS — C3490 Malignant neoplasm of unspecified part of unspecified bronchus or lung: Secondary | ICD-10-CM | POA: Diagnosis not present

## 2021-06-21 DIAGNOSIS — Z955 Presence of coronary angioplasty implant and graft: Secondary | ICD-10-CM

## 2021-06-21 DIAGNOSIS — Z888 Allergy status to other drugs, medicaments and biological substances status: Secondary | ICD-10-CM

## 2021-06-21 DIAGNOSIS — J449 Chronic obstructive pulmonary disease, unspecified: Secondary | ICD-10-CM | POA: Diagnosis not present

## 2021-06-21 DIAGNOSIS — Z923 Personal history of irradiation: Secondary | ICD-10-CM

## 2021-06-21 DIAGNOSIS — R079 Chest pain, unspecified: Secondary | ICD-10-CM | POA: Diagnosis not present

## 2021-06-21 DIAGNOSIS — I255 Ischemic cardiomyopathy: Secondary | ICD-10-CM | POA: Diagnosis present

## 2021-06-21 DIAGNOSIS — J439 Emphysema, unspecified: Secondary | ICD-10-CM | POA: Diagnosis not present

## 2021-06-21 DIAGNOSIS — Z7951 Long term (current) use of inhaled steroids: Secondary | ICD-10-CM

## 2021-06-21 HISTORY — DX: Anemia, unspecified: D64.9

## 2021-06-21 HISTORY — DX: Malignant neoplasm of unspecified part of unspecified bronchus or lung: C34.90

## 2021-06-21 LAB — CBC WITH DIFFERENTIAL/PLATELET
Abs Immature Granulocytes: 0.04 10*3/uL (ref 0.00–0.07)
Basophils Absolute: 0 10*3/uL (ref 0.0–0.1)
Basophils Relative: 0 %
Eosinophils Absolute: 0 10*3/uL (ref 0.0–0.5)
Eosinophils Relative: 0 %
HCT: 33.6 % — ABNORMAL LOW (ref 36.0–46.0)
Hemoglobin: 10.7 g/dL — ABNORMAL LOW (ref 12.0–15.0)
Immature Granulocytes: 1 %
Lymphocytes Relative: 21 %
Lymphs Abs: 1.2 10*3/uL (ref 0.7–4.0)
MCH: 30.9 pg (ref 26.0–34.0)
MCHC: 31.8 g/dL (ref 30.0–36.0)
MCV: 97.1 fL (ref 80.0–100.0)
Monocytes Absolute: 0.7 10*3/uL (ref 0.1–1.0)
Monocytes Relative: 12 %
Neutro Abs: 3.7 10*3/uL (ref 1.7–7.7)
Neutrophils Relative %: 66 %
Platelets: 139 10*3/uL — ABNORMAL LOW (ref 150–400)
RBC: 3.46 MIL/uL — ABNORMAL LOW (ref 3.87–5.11)
RDW: 13.8 % (ref 11.5–15.5)
WBC: 5.6 10*3/uL (ref 4.0–10.5)
nRBC: 0 % (ref 0.0–0.2)

## 2021-06-21 LAB — POTASSIUM: Potassium: 2.8 mmol/L — ABNORMAL LOW (ref 3.5–5.1)

## 2021-06-21 LAB — RESP PANEL BY RT-PCR (FLU A&B, COVID) ARPGX2
Influenza A by PCR: NEGATIVE
Influenza B by PCR: NEGATIVE
SARS Coronavirus 2 by RT PCR: NEGATIVE

## 2021-06-21 LAB — BASIC METABOLIC PANEL
Anion gap: 9 (ref 5–15)
BUN: 12 mg/dL (ref 8–23)
CO2: 28 mmol/L (ref 22–32)
Calcium: 8 mg/dL — ABNORMAL LOW (ref 8.9–10.3)
Chloride: 103 mmol/L (ref 98–111)
Creatinine, Ser: 0.4 mg/dL — ABNORMAL LOW (ref 0.44–1.00)
GFR, Estimated: >60 mL/min (ref >60)
Glucose, Bld: 100 mg/dL — ABNORMAL HIGH (ref 70–99)
Potassium: 2.2 mmol/L — CL (ref 3.5–5.1)
Sodium: 140 mmol/L (ref 135–145)

## 2021-06-21 LAB — TROPONIN I (HIGH SENSITIVITY)
Troponin I (High Sensitivity): 675 ng/L (ref <18)
Troponin I (High Sensitivity): 916 ng/L (ref <18)
Troponin I (High Sensitivity): 721 ng/L (ref <18)

## 2021-06-21 LAB — MAGNESIUM: Magnesium: 1.6 mg/dL — ABNORMAL LOW (ref 1.7–2.4)

## 2021-06-21 MED ORDER — POTASSIUM CHLORIDE CRYS ER 20 MEQ PO TBCR
40.0000 meq | EXTENDED_RELEASE_TABLET | Freq: Two times a day (BID) | ORAL | Status: AC
  Administered 2021-06-21 – 2021-06-22 (×2): 40 meq via ORAL
  Filled 2021-06-21 (×2): qty 2

## 2021-06-21 MED ORDER — FLUCONAZOLE 200 MG PO TABS
200.0000 mg | ORAL_TABLET | Freq: Every day | ORAL | Status: DC
  Filled 2021-06-21: qty 1

## 2021-06-21 MED ORDER — SODIUM CHLORIDE 0.9 % IV BOLUS
1000.0000 mL | Freq: Once | INTRAVENOUS | Status: AC
  Administered 2021-06-21: 1000 mL via INTRAVENOUS

## 2021-06-21 MED ORDER — ACETAMINOPHEN 325 MG PO TABS: 650.0000 mg | ORAL_TABLET | Freq: Four times a day (QID) | ORAL | Status: DC | PRN

## 2021-06-21 MED ORDER — CARVEDILOL 12.5 MG PO TABS
12.5000 mg | ORAL_TABLET | Freq: Two times a day (BID) | ORAL | Status: DC
  Administered 2021-06-22 – 2021-06-23 (×4): 12.5 mg via ORAL
  Filled 2021-06-21 (×4): qty 1

## 2021-06-21 MED ORDER — MAGNESIUM SULFATE 2 GM/50ML IV SOLN
2.0000 g | Freq: Once | INTRAVENOUS | Status: AC
  Administered 2021-06-21: 2 g via INTRAVENOUS
  Filled 2021-06-21: qty 50

## 2021-06-21 MED ORDER — NITROGLYCERIN 0.4 MG SL SUBL: 0.4000 mg | SUBLINGUAL_TABLET | SUBLINGUAL | Status: DC | PRN

## 2021-06-21 MED ORDER — INSULIN ASPART 100 UNIT/ML IJ SOLN: 0.0000 [IU] | Freq: Three times a day (TID) | INTRAMUSCULAR | Status: DC

## 2021-06-21 MED ORDER — ACETAMINOPHEN 650 MG RE SUPP: 650.0000 mg | Freq: Four times a day (QID) | RECTAL | Status: DC | PRN

## 2021-06-21 MED ORDER — ONDANSETRON HCL 4 MG/2ML IJ SOLN: 4.0000 mg | Freq: Four times a day (QID) | INTRAMUSCULAR | Status: DC | PRN

## 2021-06-21 MED ORDER — ALBUTEROL SULFATE HFA 108 (90 BASE) MCG/ACT IN AERS: 2.0000 | INHALATION_SPRAY | Freq: Four times a day (QID) | RESPIRATORY_TRACT | Status: DC | PRN

## 2021-06-21 MED ORDER — LOPERAMIDE HCL 1 MG/7.5ML PO SUSP
2.0000 mg | ORAL | Status: DC | PRN
  Filled 2021-06-21: qty 15

## 2021-06-21 MED ORDER — IOHEXOL 350 MG/ML SOLN
80.0000 mL | Freq: Once | INTRAVENOUS | Status: AC | PRN
  Administered 2021-06-21: 80 mL via INTRAVENOUS

## 2021-06-21 MED ORDER — CHLORHEXIDINE GLUCONATE CLOTH 2 % EX PADS
6.0000 | MEDICATED_PAD | Freq: Every day | CUTANEOUS | Status: DC
  Administered 2021-06-21 – 2021-06-23 (×3): 6 via TOPICAL

## 2021-06-21 MED ORDER — GUAIFENESIN-DM 100-10 MG/5ML PO SYRP: 5.0000 mL | ORAL_SOLUTION | ORAL | Status: DC | PRN

## 2021-06-21 MED ORDER — ONDANSETRON HCL 4 MG PO TABS: 4.0000 mg | ORAL_TABLET | Freq: Four times a day (QID) | ORAL | Status: DC | PRN

## 2021-06-21 MED ORDER — SODIUM CHLORIDE 0.9% FLUSH: 10.0000 mL | INTRAVENOUS | Status: DC | PRN

## 2021-06-21 MED ORDER — POTASSIUM CHLORIDE CRYS ER 20 MEQ PO TBCR
40.0000 meq | EXTENDED_RELEASE_TABLET | Freq: Once | ORAL | Status: AC
  Administered 2021-06-21: 40 meq via ORAL
  Filled 2021-06-21: qty 4

## 2021-06-21 MED ORDER — ALBUTEROL SULFATE (2.5 MG/3ML) 0.083% IN NEBU
3.0000 mL | INHALATION_SOLUTION | RESPIRATORY_TRACT | Status: DC | PRN
  Administered 2021-06-22: 3 mL via RESPIRATORY_TRACT
  Filled 2021-06-21: qty 3

## 2021-06-21 MED ORDER — LEVOTHYROXINE SODIUM 112 MCG PO TABS
112.0000 ug | ORAL_TABLET | Freq: Every day | ORAL | Status: DC
Start: 2021-06-22 — End: 2021-06-23
  Administered 2021-06-22 – 2021-06-23 (×2): 112 ug via ORAL
  Filled 2021-06-21 (×2): qty 1

## 2021-06-21 MED ORDER — POTASSIUM CHLORIDE 10 MEQ/100ML IV SOLN
10.0000 meq | INTRAVENOUS | Status: AC
  Administered 2021-06-21 (×4): 10 meq via INTRAVENOUS
  Filled 2021-06-21 (×4): qty 100

## 2021-06-21 MED ORDER — PANTOPRAZOLE SODIUM 40 MG PO TBEC
40.0000 mg | DELAYED_RELEASE_TABLET | Freq: Every day | ORAL | Status: DC
  Administered 2021-06-21 – 2021-06-23 (×3): 40 mg via ORAL
  Filled 2021-06-21 (×3): qty 1

## 2021-06-21 MED ORDER — MAGNESIUM OXIDE -MG SUPPLEMENT 400 (240 MG) MG PO TABS
800.0000 mg | ORAL_TABLET | Freq: Once | ORAL | Status: AC
  Administered 2021-06-21: 800 mg via ORAL
  Filled 2021-06-21: qty 2

## 2021-06-21 MED ORDER — FENOFIBRATE 160 MG PO TABS
160.0000 mg | ORAL_TABLET | Freq: Every day | ORAL | Status: DC
  Administered 2021-06-21 – 2021-06-23 (×3): 160 mg via ORAL
  Filled 2021-06-21 (×3): qty 1

## 2021-06-21 NOTE — ED Provider Notes (Signed)
  Physical Exam  BP 127/63 (BP Location: Right Arm)   Pulse (!) 58   Temp 97.8 F (36.6 C) (Oral)   Resp 18   Ht 5\' 2"  (1.575 m)   Wt 48 kg   LMP  (LMP Unknown)   SpO2 100%   BMI 19.35 kg/m   Physical Exam Vitals and nursing note reviewed.  Constitutional:      General: She is not in acute distress.    Appearance: She is well-developed.  HENT:     Head: Normocephalic and atraumatic.  Eyes:     Conjunctiva/sclera: Conjunctivae normal.  Cardiovascular:     Rate and Rhythm: Regular rhythm. Bradycardia present.     Heart sounds: No murmur heard. Pulmonary:     Effort: Pulmonary effort is normal. No respiratory distress.     Breath sounds: Normal breath sounds.  Abdominal:     Palpations: Abdomen is soft.     Tenderness: There is no abdominal tenderness.  Musculoskeletal:     Cervical back: Neck supple.  Skin:    General: Skin is warm and dry.  Neurological:     Mental Status: She is alert.    ED Course/Procedures     Procedures  MDM  Patient received an handoff, COVID-positive with known lung cancer and brain mets.  Labs showing significant hypokalemia to 2.2 and elevated troponin to 675.  ECG with slow A. fib, multiple PVCs.  Potassium repleted with 40 mEq oral potassium and 800 mg mag oxide.  Cardiology consulted who recommended admission to medicine and they will round on the patient tomorrow.  Patient then admitted to medicine.       Teressa Lower, MD 06/21/21 2221

## 2021-06-21 NOTE — ED Triage Notes (Signed)
Pt BIB [facility] from home for productive cough and congestion for a few days. Sputum was green this morning. Lung sounds clear. Hx lung cancer with mets to the brain. Intermittent confusion at baseline otherwise A&O. No other complaints.   BP 104/50 HR 70 RR 20 SpO2 100% RA CBG 121 Temp 98.0

## 2021-06-21 NOTE — ED Notes (Signed)
Patient returned from CT

## 2021-06-21 NOTE — H&P (Addendum)
History and Physical    [name] UYQ:[id number] DOB: [birth year] DOA: 06/21/2021  PCP: [name], [name], MD  Patient coming from: Home  I have personally briefly reviewed patient's old medical records in [place]  Chief Complaint: cough, generalized weakness   HPI: [name] is a 86 y.o. female with medical history significant of metastatic small cell lung cancer with pulmonary mets, bilateral hilar lymphadenopathy, bone mets, brain mets, s/p 18 cycles of chemotherapy last cycle on 06/01/2021, s/p whole brain irradiation under the care of Dr. Isidore Moos completed on 03/29/2021 , COPD, diabetes mellitus, hypertension, hypothyroidism, paroxysmal atrial fibrillation, tachybradycardia syndrome s/p PPM was brought in by by her daughter for generalized weakness and worsening cough. Patient reports subjective chills, occasional nausea, 1 episode of vomiting and watery diarrhea since 2 weeks.  As per the daughter patient has had diarrhea for more than 2 years and has been chronically on imodium for symptomatic relief. No fevers, no chest pain, sob on exertion is present. No palpitation, no hemoptysis or hematemesis or hematochezia. No headache, dizziness or syncopal episodes. No urinary symptoms. Pt denies any abdominal pain or back pain.    ED Course: She was afebrile, heart rate of 65/min, respiratory rate 28/min blood pressure 115/51. Labs were significant for severe hypokalemia with a potassium of 2.2 and magnesium of 1.6.  Troponin elevated at 675.  Hemoglobin of 10.7 and hematocrit of 33.6 and platelets of 139,000. Respiratory panel by PCR is negative Chest x-ray does not show any new consolidation or pneumonia  EKG shows atrial fibrillation. Patient was referred to [facility] for admission for evaluation of severe hypokalemia and cardiology was consulted for elevated troponins.  Review of Systems: As per HPI otherwise " "All others reviewed and are negative," Past Medical History:  Diagnosis  Date   CAD (coronary artery disease), native coronary artery    PTCA of distal right coronary artery 1997 Cypher stents to circumflex 2005 Cardiac cath in 2011 with patent stent to circumflex and moderate disease elsewhere treated medically    Cancer ([place])    Cardiac pacemaker in situ 12/24/2015   Original implant reportedly in 1991 for tachybradycardia syndrome, generator change in 1999, 2004 and evidently again in 2016 in [place]    COPD (chronic obstructive pulmonary disease) ([place])    Diabetes mellitus without complication ([place])    Hypertension    Hypothyroidism 03/23/2010   Pacemaker    Paroxysmal atrial fibrillation ([place]) 03/23/2010   CHA2DS2VASC score 5     Past Surgical History:  Procedure Laterality Date   BIOPSY  01/08/2020   Procedure: BIOPSY;  Surgeon: Garner Nash, DO;  Location: [place] [facility];  Service: Pulmonary;;   BRONCHIAL BRUSHINGS  01/08/2020   Procedure: BRONCHIAL BRUSHINGS;  Surgeon: Garner Nash, DO;  Location: [place] [facility];  Service: Pulmonary;;   BRONCHIAL WASHINGS  01/08/2020   Procedure: BRONCHIAL WASHINGS;  Surgeon: Garner Nash, DO;  Location: [facility];  Service: Pulmonary;;   CARDIAC CATHETERIZATION N/A 12/26/2015   Procedure: Left Heart Cath and Coronary Angiography;  Surgeon: Peter M [name], MD;  Location: [facility];  Service: Cardiovascular;  Laterality: N/A;   CARDIOVERSION  05/2015   CHOLECYSTECTOMY  2012   CORONARY ANGIOPLASTY WITH STENT PLACEMENT  1990   ENDOBRONCHIAL ULTRASOUND  01/08/2020   Procedure: ENDOBRONCHIAL ULTRASOUND;  Surgeon: Garner Nash, DO;  Location: [place] [facility];  Service: Pulmonary;;   FINE NEEDLE ASPIRATION BIOPSY  01/08/2020   Procedure: FINE NEEDLE ASPIRATION BIOPSY;  Surgeon: Valeta Harms,  Octavio Graves, DO;  Location: [place] [facility];  Service: Pulmonary;;   HERNIA REPAIR     IR IMAGING GUIDED PORT INSERTION  02/18/2020   PACEMAKER GENERATOR CHANGE  [id number], 2016   PACEMAKER INSERTION  1991   VIDEO BRONCHOSCOPY WITH  ENDOBRONCHIAL ULTRASOUND N/A 01/08/2020   Procedure: VIDEO BRONCHOSCOPY;  Surgeon: Garner Nash, DO;  Location: [place] [facility];  Service: Pulmonary;  Laterality: N/A;    Social History  reports that she has been smoking cigarettes. She has a 16.50 pack-year smoking history. She has never used smokeless tobacco. She reports that she does not drink alcohol and does not use drugs.  Allergies  Allergen Reactions   Seasonal Ic [Cholestatin]     Itchy eye and runny nose    Family History  Problem Relation Age of Onset   Heart disease Father    Heart disease Mother    Heart attack Mother    Cirrhosis Brother    Colon cancer Neg Hx    Stomach cancer Neg Hx    Family history is negative for IBS.   Prior to Admission medications   Medication Sig Start Date End Date Taking? Authorizing Provider  acetaminophen (TYLENOL) 500 MG tablet Take 500 mg by mouth every 6 (six) hours as needed for headache.   Yes [provider]  albuterol (VENTOLIN HFA) 108 (90 Base) MCG/ACT inhaler TAKE 2 PUFFS BY MOUTH EVERY 6 HOURS AS NEEDED FOR WHEEZE OR SHORTNESS OF BREATH Patient taking differently: Inhale 2 puffs into the lungs every 6 (six) hours as needed for wheezing or shortness of breath. 05/25/20  Yes [name], [name], MD  atorvastatin (LIPITOR) 10 MG tablet TAKE 1 TABLET BY MOUTH EVERY DAY Patient taking differently: Take 10 mg by mouth daily. 06/08/21  Yes [name], MD  carvedilol (COREG) 12.5 MG tablet Take 1 tablet (12.5 mg total) by mouth 2 (two) times daily with a meal. 03/29/21  Yes [name], [name], MD  fenofibrate (TRICOR) 48 MG tablet Take 48 mg by mouth daily. 05/27/21  Yes [provider]  fluconazole (DIFLUCAN) 100 MG tablet Take 2 tablets by mouth today, then 1 tablet daily x 20 more days. Hold Atorvastatin while on this. 05/12/21  Yes [name], MD  KLOR-CON M20 20 MEQ tablet TAKE 1 TABLET BY MOUTH TWICE A DAY FOR 2 DAYS THEN TAKE 1 TABLET BY MOUTH EVERY DAY Patient  taking differently: Take 20 mEq by mouth daily. 03/10/21  Yes [name], [name], MD  levothyroxine (SYNTHROID) 112 MCG tablet Take 1 tablet (112 mcg total) by mouth daily before breakfast. 11/07/20  Yes [name], [name], MD  linagliptin (TRADJENTA) 5 MG TABS tablet Take 1 tablet (5 mg total) by mouth daily. 04/26/21  Yes [name], [name], MD  Loperamide HCl (IMODIUM PO) Take by mouth as needed.   Yes [provider]  losartan (COZAAR) 100 MG tablet TAKE 1 TABLET BY MOUTH EVERY DAY Patient taking differently: Take 100 mg by mouth daily. 05/12/21  Yes [name], [name], MD  nitroGLYCERIN (NITROSTAT) 0.4 MG SL tablet Place 1 tablet (0.4 mg total) under the tongue every 5 (five) minutes x 3 doses as needed for chest pain (Max 3 doses within 15 min. Call 911). 05/26/20  Yes Sherran Needs, NP  pantoprazole (PROTONIX) 40 MG tablet TAKE 1 TABLET BY MOUTH EVERY DAY Patient taking differently: Take 40 mg by mouth daily. 03/29/21  Yes [name], [name], MD  prochlorperazine (COMPAZINE) 10 MG tablet Take 1 tablet (  10 mg total) by mouth every 6 (six) hours as needed for nausea or vomiting. 02/05/20  Yes Curt Bears, MD  diclofenac sodium (VOLTAREN) 1 % GEL Apply 4 g topically 4 (four) times daily. Patient not taking: Reported on 06/21/2021 11/12/18   [name], [name], MD  fluticasone-salmeterol (ADVAIR [facility]) [phone] MCG/ACT inhaler Inhale 2 puffs into the lungs 2 (two) times daily. Patient not taking: Reported on 06/21/2021 04/26/21   [name], [name], MD  glucose blood [facility] VERIO) test strip Use to test 3-4 times daily. 09/08/19   [name], [name], MD  lidocaine-prilocaine (EMLA) cream Apply 1 application topically as needed. Patient not taking: Reported on 06/21/2021 04/11/20   Heilingoetter, Cassandra L, PA-C  nicotine (NICODERM CQ - DOSED IN MG/24 HOURS) 14 mg/24hr patch PLACE 1 PATCH ONTO THE SKIN DAILY. Patient not taking: Reported on 06/21/2021 02/20/21   [name], [name], MD  Spacer/Aero-Holding Chambers  (AEROCHAMBER PLUS) inhaler Use as instructed with inahaler. 04/26/21   [name], [name], MD  triamcinolone (KENALOG) 0.025 % ointment Apply 1 application topically 2 (two) times daily. Patient not taking: Reported on 06/21/2021 04/15/21   Scot Jun, FNP    Physical Exam: Vitals:   06/21/21 1530 06/21/21 1538 06/21/21 1600 06/21/21 1629  BP: (!) 128/58  (!) 120/55 (!) 111/51  Pulse: 70  67 65  Resp: 16  (!) 28 (!) 28  Temp:      TempSrc:      SpO2: 95%  97% 96%  Weight:  48 kg    Height:  5\' 2"  (1.575 m)      Constitutional: NAD, calm, comfortable Vitals:   06/21/21 1530 06/21/21 1538 06/21/21 1600 06/21/21 1629  BP: (!) 128/58  (!) 120/55 (!) 111/51  Pulse: 70  67 65  Resp: 16  (!) 28 (!) 28  Temp:      TempSrc:      SpO2: 95%  97% 96%  Weight:  48 kg    Height:  5\' 2"  (1.575 m)     Eyes: PERRL, lids and conjunctivae normal ENMT: Mucous membranes are dry,  Neck: normal, supple, no masses, no thyromegaly Respiratory: scattered rhonchi, no wheezing, no crackles. Normal respiratory effort.  Cardiovascular: Regular rate and rhythm, no murmurs , no pedal edema, no JVD.  Abdomen: no tenderness, no masses palpated. Bowel sounds positive.  Musculoskeletal: no clubbing / cyanosis.  Skin:  Neurologic: CN 2-12 grossly intact.  Psychiatric: normal mood.  Labs on Admission: I have personally reviewed following labs and imaging studies  CBC: Recent Labs  Lab 06/21/21 1427  WBC 5.6  NEUTROABS 3.7  HGB 10.7*  HCT 33.6*  MCV 97.1  PLT 139*    Basic Metabolic Panel: Recent Labs  Lab 06/21/21 1427  NA 140  K 2.2*  CL 103  CO2 28  GLUCOSE 100*  BUN 12  CREATININE 0.40*  CALCIUM 8.0*  MG 1.6*    GFR: Estimated Creatinine Clearance: 39.7 mL/min (A) (by C-G formula based on SCr of 0.4 mg/dL (L)).  Liver Function Tests: No results for input(s): AST, ALT, ALKPHOS, BILITOT, PROT, ALBUMIN in the last 168 hours.  Urine analysis:    Component Value Date/Time    COLORURINE YELLOW 01/05/2020 0631   APPEARANCEUR HAZY (A) 01/05/2020 0631   LABSPEC 1.023 01/05/2020 0631   PHURINE 5.0 01/05/2020 0631   GLUCOSEU NEGATIVE 01/05/2020 0631   HGBUR NEGATIVE 01/05/2020 0631   BILIRUBINUR neg 10/05/2020 1107   KETONESUR NEGATIVE 01/05/2020 0631   PROTEINUR  Negative 10/05/2020 1107   PROTEINUR 30 (A) 01/05/2020 0631   UROBILINOGEN 0.2 10/05/2020 1107   NITRITE neg 10/05/2020 1107   NITRITE NEGATIVE 01/05/2020 0631   LEUKOCYTESUR Negative 10/05/2020 1107   LEUKOCYTESUR NEGATIVE 01/05/2020 0631    Radiological Exams on Admission: DG Chest 2 View  Result Date: 06/21/2021 CLINICAL DATA:  Cough, history of lung cancer EXAM: CHEST - 2 VIEW COMPARISON:  03/18/2021 FINDINGS: Left chest port catheter. Cardiomegaly with right chest multi lead pacer defibrillator. No acute airspace opacity. Disc degenerative disease of the thoracic spine. IMPRESSION: Cardiomegaly without acute abnormality of the lungs. Electronically Signed   By: Eddie Candle M.D.   On: 06/21/2021 14:22    EKG: Independently reviewed. Atrial fibrillation.   Assessment/Plan Active Problems:   Hypothyroidism   Paroxysmal atrial fibrillation (HCC)   GERD   Cardiac pacemaker in situ   Type 2 diabetes mellitus with other specified complication (HCC)   Hyperlipidemia associated with type 2 diabetes mellitus (HCC)   Chronic diarrhea   Small cell lung cancer, right ([place])   Metastatic cancer to brain ([place])   Hypokalemia     Severe Hypokalemia and hypomagnesemia: Probably secondary to chronic diarrhea, which appears to have improved for now.  Replace aggressively.  Recheck levels tomorrow.    Sob, productive cough with some chills.  CXR did not show any pneumonia or consolidation.  Will get a CT chest with contrast for further evaluation.    Metastatic small cell lung cancer s/p chemo and radiation Will notify Dr. Julien Nordmann of patient's admission S/p completion of 18 cycles and whole brain  irradiation.   Non-insulin-dependent diabetes mellitus Start the patient on sliding scale insulin. Get A1c.   Hypothyroidism Last TSH within normal limits, continue home dose of thyroid replacement.    Anemia of chronic disease  Baseline hemoglobin around 10 and stable at this time.    Mild thrombocytopenia Continue to monitor for bleeding   Elevated troponins , no chest pain and EKG shows atrial fibrillation with  nonspecific T wave inversions in the lateral leads similar to the one in June 2022 probably from demand ischemia from ? Hypokalemia ? Acute illness from diarrhea.  Will get echocardiogram for further evaluation.  Not a candidate for anticoagulation as pt has brain mets.  Cardiology consulted.   Hyperlipidemia:  Statin on hold.    Hypertension;  Well controlled.    PAF:  Xarelto on hold for brain metastasis Rate controlled with Coreg.  S/p pacemaker placement.  Patient denies any complaints of palpitations or dizziness   Failure to thrive, deconditioning and generalized weakness.  - therapy evaluations ordered.      DVT prophylaxis: scd's Code Status:   DNR Family Communication:  None at bedside. Discussed with daughter over the phone.  Disposition Plan:   Patient is from:  home  Anticipated DC to:  Home  Anticipated DC date:  tomorrow  Anticipated DC barriers: Hypokalemia.   Consults called:  oncology Admission status:  Obs/progressive  Severity of Illness: The appropriate patient status for this patient is OBSERVATION. Observation status is judged to be reasonable and necessary in order to provide the required intensity of service to ensure the patient's safety. The patient's presenting symptoms, physical exam findings, and initial radiographic and laboratory data in the context of their medical condition is felt to place them at decreased risk for further clinical deterioration. Furthermore, it is anticipated that the patient will be medically  stable for discharge from the hospital within 2 midnights  of admission.     Hosie Poisson MD [facility]  How to contact the [facility] Attending or Consulting provider [place] or covering provider during after hours [place], for this patient?   Check the care team in [facility] and look for a) attending/consulting [facility] provider listed and b) the [facility] team listed Log into [email or web address] and use [place]'s universal password to access. If you do not have the password, please contact the hospital operator. Locate the [facility] provider you are looking for under [facility] and page to a number that you can be directly reached. If you still have difficulty reaching the provider, please page the [facility] (Director on Call) for the Hospitalists listed on amion for assistance.  06/21/2021, 5:55 PM

## 2021-06-21 NOTE — ED Notes (Addendum)
Patient transported to CT 

## 2021-06-21 NOTE — ED Notes (Signed)
Per floor staff, pt is inappropriate for current telemetry bed placement. Pt will be reassigned to progressive care.

## 2021-06-22 ENCOUNTER — Telehealth (HOSPITAL_COMMUNITY): Payer: Self-pay | Admitting: Pharmacist

## 2021-06-22 ENCOUNTER — Other Ambulatory Visit: Payer: Self-pay

## 2021-06-22 ENCOUNTER — Encounter (HOSPITAL_COMMUNITY): Payer: Self-pay | Admitting: Internal Medicine

## 2021-06-22 ENCOUNTER — Observation Stay (HOSPITAL_COMMUNITY): Payer: Medicare Other

## 2021-06-22 DIAGNOSIS — Z66 Do not resuscitate: Secondary | ICD-10-CM | POA: Diagnosis not present

## 2021-06-22 DIAGNOSIS — D61810 Antineoplastic chemotherapy induced pancytopenia: Secondary | ICD-10-CM | POA: Diagnosis not present

## 2021-06-22 DIAGNOSIS — I495 Sick sinus syndrome: Secondary | ICD-10-CM | POA: Diagnosis not present

## 2021-06-22 DIAGNOSIS — R0600 Dyspnea, unspecified: Secondary | ICD-10-CM | POA: Diagnosis not present

## 2021-06-22 DIAGNOSIS — K529 Noninfective gastroenteritis and colitis, unspecified: Secondary | ICD-10-CM | POA: Diagnosis not present

## 2021-06-22 DIAGNOSIS — E1169 Type 2 diabetes mellitus with other specified complication: Secondary | ICD-10-CM | POA: Diagnosis not present

## 2021-06-22 DIAGNOSIS — I4821 Permanent atrial fibrillation: Secondary | ICD-10-CM | POA: Diagnosis not present

## 2021-06-22 DIAGNOSIS — R778 Other specified abnormalities of plasma proteins: Secondary | ICD-10-CM | POA: Diagnosis not present

## 2021-06-22 DIAGNOSIS — J449 Chronic obstructive pulmonary disease, unspecified: Secondary | ICD-10-CM | POA: Diagnosis present

## 2021-06-22 DIAGNOSIS — Z9221 Personal history of antineoplastic chemotherapy: Secondary | ICD-10-CM | POA: Diagnosis not present

## 2021-06-22 DIAGNOSIS — C3491 Malignant neoplasm of unspecified part of right bronchus or lung: Secondary | ICD-10-CM | POA: Diagnosis not present

## 2021-06-22 DIAGNOSIS — R079 Chest pain, unspecified: Secondary | ICD-10-CM | POA: Diagnosis not present

## 2021-06-22 DIAGNOSIS — I5041 Acute combined systolic (congestive) and diastolic (congestive) heart failure: Secondary | ICD-10-CM | POA: Diagnosis not present

## 2021-06-22 DIAGNOSIS — Z681 Body mass index (BMI) 19 or less, adult: Secondary | ICD-10-CM | POA: Diagnosis not present

## 2021-06-22 DIAGNOSIS — C7951 Secondary malignant neoplasm of bone: Secondary | ICD-10-CM | POA: Diagnosis not present

## 2021-06-22 DIAGNOSIS — C7931 Secondary malignant neoplasm of brain: Secondary | ICD-10-CM | POA: Diagnosis not present

## 2021-06-22 DIAGNOSIS — D630 Anemia in neoplastic disease: Secondary | ICD-10-CM | POA: Diagnosis present

## 2021-06-22 DIAGNOSIS — K219 Gastro-esophageal reflux disease without esophagitis: Secondary | ICD-10-CM | POA: Diagnosis present

## 2021-06-22 DIAGNOSIS — I110 Hypertensive heart disease with heart failure: Secondary | ICD-10-CM | POA: Diagnosis present

## 2021-06-22 DIAGNOSIS — I5021 Acute systolic (congestive) heart failure: Secondary | ICD-10-CM | POA: Diagnosis not present

## 2021-06-22 DIAGNOSIS — Z950 Presence of cardiac pacemaker: Secondary | ICD-10-CM

## 2021-06-22 DIAGNOSIS — R627 Adult failure to thrive: Secondary | ICD-10-CM | POA: Diagnosis not present

## 2021-06-22 DIAGNOSIS — Z20822 Contact with and (suspected) exposure to covid-19: Secondary | ICD-10-CM | POA: Diagnosis not present

## 2021-06-22 DIAGNOSIS — E039 Hypothyroidism, unspecified: Secondary | ICD-10-CM | POA: Diagnosis present

## 2021-06-22 DIAGNOSIS — E876 Hypokalemia: Secondary | ICD-10-CM | POA: Diagnosis not present

## 2021-06-22 DIAGNOSIS — I480 Paroxysmal atrial fibrillation: Secondary | ICD-10-CM | POA: Diagnosis not present

## 2021-06-22 DIAGNOSIS — R9431 Abnormal electrocardiogram [ECG] [EKG]: Secondary | ICD-10-CM | POA: Diagnosis not present

## 2021-06-22 DIAGNOSIS — E785 Hyperlipidemia, unspecified: Secondary | ICD-10-CM | POA: Diagnosis present

## 2021-06-22 LAB — ECHOCARDIOGRAM COMPLETE
AR max vel: 1.59 cm2
AV Area VTI: 1.7 cm2
AV Area mean vel: 1.46 cm2
AV Mean grad: 2 mmHg
AV Peak grad: 4.9 mmHg
Ao pk vel: 1.11 m/s
Area-P 1/2: 4.83 cm2
Calc EF: 33.8 %
Height: 62 in
P 1/2 time: 376 msec
S' Lateral: 3.9 cm
Single Plane A2C EF: 27.6 %
Single Plane A4C EF: 36.4 %
Weight: 1696.66 oz

## 2021-06-22 LAB — CBC
HCT: 33.5 % — ABNORMAL LOW (ref 36.0–46.0)
Hemoglobin: 10.2 g/dL — ABNORMAL LOW (ref 12.0–15.0)
MCH: 30 pg (ref 26.0–34.0)
MCHC: 30.4 g/dL (ref 30.0–36.0)
MCV: 98.5 fL (ref 80.0–100.0)
Platelets: 140 10*3/uL — ABNORMAL LOW (ref 150–400)
RBC: 3.4 MIL/uL — ABNORMAL LOW (ref 3.87–5.11)
RDW: 13.9 % (ref 11.5–15.5)
WBC: 4.7 10*3/uL (ref 4.0–10.5)
nRBC: 0 % (ref 0.0–0.2)

## 2021-06-22 LAB — MAGNESIUM: Magnesium: 2.1 mg/dL (ref 1.7–2.4)

## 2021-06-22 LAB — BASIC METABOLIC PANEL WITH GFR
Anion gap: 6 (ref 5–15)
BUN: 10 mg/dL (ref 8–23)
CO2: 24 mmol/L (ref 22–32)
Calcium: 8 mg/dL — ABNORMAL LOW (ref 8.9–10.3)
Chloride: 114 mmol/L — ABNORMAL HIGH (ref 98–111)
Creatinine, Ser: 0.32 mg/dL — ABNORMAL LOW (ref 0.44–1.00)
GFR, Estimated: >60 mL/min
Glucose, Bld: 67 mg/dL — ABNORMAL LOW (ref 70–99)
Potassium: 3.9 mmol/L (ref 3.5–5.1)
Sodium: 144 mmol/L (ref 135–145)

## 2021-06-22 LAB — GLUCOSE, CAPILLARY
Glucose-Capillary: 88 mg/dL (ref 70–99)
Glucose-Capillary: 104 mg/dL — ABNORMAL HIGH (ref 70–99)
Glucose-Capillary: 89 mg/dL (ref 70–99)

## 2021-06-22 MED ORDER — POTASSIUM CHLORIDE CRYS ER 20 MEQ PO TBCR
20.0000 meq | EXTENDED_RELEASE_TABLET | Freq: Every day | ORAL | Status: DC
  Administered 2021-06-22 – 2021-06-23 (×2): 20 meq via ORAL
  Filled 2021-06-22 (×2): qty 1

## 2021-06-22 MED ORDER — LOPERAMIDE HCL 1 MG/7.5ML PO SUSP
2.0000 mg | Freq: Three times a day (TID) | ORAL | Status: DC | PRN
  Administered 2021-06-22 – 2021-06-23 (×2): 2 mg via ORAL
  Filled 2021-06-22 (×3): qty 15

## 2021-06-22 MED ORDER — PERFLUTREN LIPID MICROSPHERE
1.0000 mL | INTRAVENOUS | Status: AC | PRN
  Administered 2021-06-22: 2 mL via INTRAVENOUS
  Filled 2021-06-22: qty 10

## 2021-06-22 MED ORDER — DOXYCYCLINE HYCLATE 100 MG PO TABS
100.0000 mg | ORAL_TABLET | Freq: Two times a day (BID) | ORAL | Status: DC
  Administered 2021-06-22 – 2021-06-23 (×2): 100 mg via ORAL
  Filled 2021-06-22 (×2): qty 1

## 2021-06-22 MED ORDER — LOSARTAN POTASSIUM 50 MG PO TABS
100.0000 mg | ORAL_TABLET | Freq: Every day | ORAL | Status: DC
  Administered 2021-06-22 – 2021-06-23 (×2): 100 mg via ORAL
  Filled 2021-06-22 (×2): qty 2

## 2021-06-22 NOTE — Progress Notes (Signed)
PROGRESS NOTE   [name]  BUL:[id number]    DOB: Mar 21, 1935    DOA: 06/21/2021  PCP: [name], [name], MD   I have briefly reviewed patients previous medical records in [facility].  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  Patient with history of chronic diarrhea on Imodium.  Admitted for severe hypokalemia/hypomagnesemia secondary to acute on chronic diarrhea and elevated troponins.  Cardiology was consulted.   Assessment & Plan:  Active Problems:   Hypothyroidism   Paroxysmal atrial fibrillation (HCC)   GERD   Cardiac pacemaker in situ   Type 2 diabetes mellitus with other specified complication ([place])   Hyperlipidemia associated with type 2 diabetes mellitus (HCC)   Chronic diarrhea   Small cell lung cancer, right ([place])   Metastatic cancer to brain ([place])   Hypokalemia   Hypokalemia: Suspected secondary to poor oral intake and diarrhea.  Replaced.  Follow BMP in AM.  Hypomagnesemia: Secondary to poor oral intake and diarrhea.  Replaced.  Follow labs in AM.  Acute on chronic diarrhea: Unclear etiology.?  Viral.  Low index of suspicion for C. difficile.  Continue as needed Imodium and consider work-up if does not return to her baseline.  Elevated troponin: Cath in 2019 showed diffuse nonobstructive CAD.  Most recent echo showed LVEF of 55-60%.  Cardiology input appreciated.  Recommend checking 2D echocardiogram.  Unfortunately  not a candidate for invasive evaluation.  If EF reduced to require medical management.  Permanent atrial fibrillation: Telemetry shows A. fib with controlled/on demand V paced rhythm.  As per cardiology, anticoagulation on hold given brain mets.  Continue carvedilol.  Checking repeat echocardiogram.  Stage IV small cell lung cancer s/p chemotherapy and whole brain radiation: Consulted with oncology.  CT chest abdomen and pelvis with contrast shows stable exam, no evidence of metastatic disease in abdomen or pelvis, no acute process in the chest abdomen or pelvis.  Unsure if her dyspnea is related to lung nodules versus COPD versus others.  She does report productive cough.  Will treat with a brief course of oral doxycycline.  Type II DM: Continue SSI.  Hypothyroidism: Continue thyroid replacement.  Anemia of chronic disease: Stable.  Thrombocytopenia: Stable.  Hyperlipidemia: Statins on hold.  Essential hypertension: Controlled  Adult failure to thrive:  Body mass index is 19.4 kg/m.  Nutritional Status        Pressure Ulcer:     DVT prophylaxis: SCDs Start: 06/21/21 1802     Code Status: DNR Family Communication: I discussed with patients daughter, updated care and answered questions. Disposition:  Status is: Observation  The patient will require care spanning > 2 midnights and should be moved to inpatient because: Inpatient level of care appropriate due to severity of illness  Dispo: The patient is from: Home              Anticipated d/c is to: Home              Patient currently is not medically stable to d/c.  Difficult to place patient No        Consultants:   Medical oncology Cardiology  Procedures:   None  Antimicrobials:    Anti-infectives (From admission, onward)    Start     Dose/Rate Route Frequency Ordered Stop   06/22/21 1000  fluconazole (DIFLUCAN) tablet 200 mg  Status:  Discontinued        200 mg Oral Daily 06/21/21 1733 06/22/21 0925          Subjective:  Seen this morning.  Reported feeling better.  States that she ate for the first time at breakfast after several days.  To me she stated that she had not had a BM since admission but subsequently in the afternoon RN reported ongoing diarrhea.  Also reports cough with productive green sputum and intermittent dyspnea without chest pain or wheezing.  Objective:   Vitals:   06/22/21 0700 06/22/21 0750 06/22/21 1300 06/22/21 1500  BP:  126/72    Pulse:  72    Resp: 16 18 16 18   Temp:  98.3 F (36.8 C)    TempSrc:  Oral    SpO2:  97%    Weight:      Height:        General exam: Elderly female, small built and frail, sitting up comfortably in bed without distress.  Intermittent wet sounding cough. Respiratory system: Slightly harsh sounding breath sounds but no wheezing, rhonchi or crackles.  No increased work of breathing. Cardiovascular system: S1 & S2 heard, RRR. No JVD, murmurs, rubs, gallops or clicks. No pedal edema.  Telemetry personally reviewed: A. fib with controlled ventricular rate and on demand V paced rhythm. Gastrointestinal system: Abdomen is nondistended, soft and nontender. No organomegaly or masses felt. Normal bowel sounds heard. Central nervous system: Alert and oriented. No focal neurological deficits. Extremities: Symmetric 5 x 5 power. Skin: No rashes, lesions or ulcers Psychiatry: Judgement and insight appear normal. Mood & affect appropriate.     Data Reviewed:   I have personally reviewed following labs and imaging studies   CBC: Recent Labs  Lab 06/21/21 1427 06/22/21 0546  WBC 5.6 4.7  NEUTROABS 3.7  --   HGB 10.7* 10.2*  HCT 33.6* 33.5*  MCV 97.1 98.5  PLT 139* 140*    Basic Metabolic Panel: Recent Labs  Lab 06/21/21 1427 06/21/21 1947 06/22/21 0546  NA 140  --  144  K 2.2* 2.8* 3.9  CL 103  --  114*  CO2 28  --  24  GLUCOSE 100*  --  67*  BUN 12  --  10  CREATININE 0.40*  --  0.32*  CALCIUM 8.0*  --  8.0*   MG 1.6*  --  2.1    Liver Function Tests: No results for input(s): AST, ALT, ALKPHOS, BILITOT, PROT, ALBUMIN in the last 168 hours.  CBG: Recent Labs  Lab 06/22/21 0750 06/22/21 1300  GLUCAP 88 104*    Microbiology Studies:   Recent Results (from the past 240 hour(s))  Resp Panel by RT-PCR (Flu A&B, Covid) Nasopharyngeal Swab     Status: None   Collection Time: 06/21/21  2:24 PM   Specimen: Nasopharyngeal Swab; Nasopharyngeal(NP) swabs in vial transport medium  Result Value Ref Range Status   SARS Coronavirus 2 by RT PCR NEGATIVE NEGATIVE Final    Comment: (NOTE) SARS-CoV-2 target nucleic acids are NOT DETECTED.  The SARS-CoV-2 RNA is generally detectable in upper respiratory specimens during the acute phase of infection. The lowest concentration  of SARS-CoV-2 viral copies this assay can detect is 138 copies/mL. A negative result does not preclude SARS-Cov-2 infection and should not be used as the sole basis for treatment or other patient management decisions. A negative result may occur with  improper specimen collection/handling, submission of specimen other than nasopharyngeal swab, presence of viral mutation(s) within the areas targeted by this assay, and inadequate number of viral copies(<138 copies/mL). A negative result must be combined with clinical observations, patient history, and epidemiological information. The expected result is Negative.  Fact Sheet for Patients:  EntrepreneurPulse.com.au  Fact Sheet for Healthcare Providers:  IncredibleEmployment.be  This test is no t yet approved or cleared by the [place] FDA and  has been authorized for detection and/or diagnosis of SARS-CoV-2 by FDA under an Emergency Use Authorization (EUA). This EUA will remain  in effect (meaning this test can be used) for the duration of the COVID-19 declaration under Section 564(b)(1) of the Act, 21 U.S.C.section 360bbb-3(b)(1), unless  the authorization is terminated  or revoked sooner.       Influenza A by PCR NEGATIVE NEGATIVE Final   Influenza B by PCR NEGATIVE NEGATIVE Final    Comment: (NOTE) The Xpert Xpress SARS-CoV-2/FLU/RSV plus assay is intended as an aid in the diagnosis of influenza from Nasopharyngeal swab specimens and should not be used as a sole basis for treatment. Nasal washings and aspirates are unacceptable for Xpert Xpress SARS-CoV-2/FLU/RSV testing.  Fact Sheet for Patients: EntrepreneurPulse.com.au  Fact Sheet for Healthcare Providers: IncredibleEmployment.be  This test is not yet approved or cleared by the [place] FDA and has been authorized for detection and/or diagnosis of SARS-CoV-2 by FDA under an Emergency Use Authorization (EUA). This EUA will remain in effect (meaning this test can be used) for the duration of the COVID-19 declaration under Section 564(b)(1) of the Act, 21 U.S.C. section 360bbb-3(b)(1), unless the authorization is terminated or revoked.  Performed at [facility], [place] [address], [place], [place] [address]      Radiology Studies:  DG Chest 2 View  Result Date: 06/21/2021 CLINICAL DATA:  Cough, history of lung cancer EXAM: CHEST - 2 VIEW COMPARISON:  03/18/2021 FINDINGS: Left chest port catheter. Cardiomegaly with right chest multi lead pacer defibrillator. No acute airspace opacity. Disc degenerative disease of the thoracic spine. IMPRESSION: Cardiomegaly without acute abnormality of the lungs. Electronically Signed   By: Eddie Candle M.D.   On: 06/21/2021 14:22   CT CHEST ABDOMEN PELVIS W CONTRAST  Result Date: 06/21/2021 CLINICAL DATA:  Metastatic small-cell lung cancer EXAM: CT CHEST, ABDOMEN, AND PELVIS WITH CONTRAST TECHNIQUE: Multidetector CT imaging of the chest, abdomen and pelvis was performed following the standard protocol during bolus administration of intravenous contrast. CONTRAST:  63mL  OMNIPAQUE IOHEXOL 350 MG/ML SOLN COMPARISON:  03/14/2021 CT chest abdomen pelvis. FINDINGS: CT CHEST FINDINGS Cardiovascular: Coronary, aortic arch, and branch vessel atherosclerotic calcifications. Unchanged mild cardiomegaly. Right chest wall pacer with leads in the right atrial appendage and right ventricle. No pericardial effusion. Mediastinum/Nodes: The thyroid gland is not visualized. No enlarged mediastinal, hilar, or axillary lymph nodes. The trachea is midline. Lungs/Pleura: Advanced emphysema. No focal airspace consolidation, pleural effusion, or pneumothorax. Redemonstrated medial right upper lobe nodule measures 15 x 14 mm (series 4, image 55), previously 15 x 15 mm when remeasured similarly, likely unchanged when accounting for slice selection. 8 mm triangular nodule in the left lower lobe (series 4, image 74), unchanged. Musculoskeletal: No acute osseous abnormality. CT ABDOMEN PELVIS FINDINGS  Hepatobiliary: No focal liver abnormality is seen. Status post cholecystectomy. No biliary dilatation. Pancreas: Unremarkable. No pancreatic ductal dilatation or surrounding inflammatory changes. Spleen: Normal in size without focal abnormality. Adrenals/Urinary Tract: The adrenals are unremarkable. The kidneys enhance symmetrically with no hydronephrosis. Unchanged left mid kidney cyst. No nephrolithiasis. The bladder is unremarkable. Stomach/Bowel: The stomach is within normal limits. No bowel wall thickening or evidence of obstruction. Diverticulosis without evidence of diverticulitis. Vascular/Lymphatic: Aortic atherosclerosis. No enlarged abdominal or pelvic lymph nodes. Reproductive: Uterus and bilateral adnexa are unremarkable. Other: No free fluid or free air in the abdomen or pelvis. Musculoskeletal: No acute osseous abnormality. Right femoral nail. Unchanged sclerosis in the left iliac bone. IMPRESSION: 1. Stable exam. Unchanged appearance of right upper lobe and left lower lobe lung nodules. 2. No  evidence of metastatic disease in the abdomen or pelvis. 3. No acute process in the chest, abdomen, or pelvis. 4. Aortic Atherosclerosis (ICD10-I70.0) and Emphysema (ICD10-J43.9). Electronically Signed   By: Merilyn Baba M.D.   On: 06/21/2021 19:35   ECHOCARDIOGRAM COMPLETE  Result Date: 06/22/2021    ECHOCARDIOGRAM REPORT   Patient Name:   [name] Date of Exam: 06/22/2021 Medical Rec #:  [id number]   Height:       62.0 in Accession #:    [id number]  Weight:       106.0 lb Date of Birth:  [birth year]   BSA:          1.460 m Patient Age:    19 years    BP:           126/72 mmHg Patient Gender: F           HR:           65 bpm. Exam Location:  Inpatient Procedure: 2D Echo, 3D Echo, Cardiac Doppler, Color Doppler, Strain Analysis and            Intracardiac Opacification Agent Indications:    R07.9* Chest pain, unspecified. Elevated troponin.  History:        Patient has prior history of Echocardiogram examinations, most                 recent 01/06/2020. Abnormal ECG and Pacemaker, Arrythmias:Atrial                 Fibrillation, Signs/Symptoms:Syncope; Risk Factors:Diabetes,                 Current Smoker and Dyslipidemia. Metastatic cancer.  Sonographer:    Roseanna Rainbow RDCS Referring Phys: Theola Sequin  Sonographer Comments: Global longitudinal strain was attempted. IMPRESSIONS  1. Global hypokinesis with akinesis of the distal anteroseptal wall, apex and distal inferior wall; overall severe LV dysfunction. LV function worse compared to previous.  2. Left ventricular ejection fraction, by estimation, is 25 to 30%. The left ventricle has severely decreased function. The left ventricle demonstrates global hypokinesis. Left ventricular diastolic parameters are indeterminate. The average left ventricular global longitudinal strain is -7.0 %. The global longitudinal strain is abnormal.  3. Right ventricular systolic function is normal. The right ventricular size is mildly enlarged. There is mildly elevated  pulmonary artery systolic pressure.  4. Left atrial size was severely dilated.  5. Right atrial size was moderately dilated.  6. The mitral valve is normal in structure. Mild mitral valve regurgitation. No evidence of mitral stenosis.  7. Tricuspid valve regurgitation is moderate.  8. The aortic valve is tricuspid. Aortic valve regurgitation is moderate to severe. Mild aortic  valve sclerosis is present, with no evidence of aortic valve stenosis.  9. The inferior vena cava is normal in size with greater than 50% respiratory variability, suggesting right atrial pressure of 3 mmHg. FINDINGS  Left Ventricle: Left ventricular ejection fraction, by estimation, is 25 to 30%. The left ventricle has severely decreased function. The left ventricle demonstrates global hypokinesis. Definity contrast agent was given IV to delineate the left ventricular endocardial borders. The average left ventricular global longitudinal strain is -7.0 %. The global longitudinal strain is abnormal. The left ventricular internal cavity size was normal in size. There is no left ventricular hypertrophy. Left ventricular diastolic parameters are indeterminate. Right Ventricle: The right ventricular size is mildly enlarged. Right ventricular systolic function is normal. There is mildly elevated pulmonary artery systolic pressure. The tricuspid regurgitant velocity is 2.94 m/s, and with an assumed right atrial pressure of 3 mmHg, the estimated right ventricular systolic pressure is 12.4 mmHg. Left Atrium: Left atrial size was severely dilated. Right Atrium: Right atrial size was moderately dilated. Pericardium: There is no evidence of pericardial effusion. Mitral Valve: The mitral valve is normal in structure. Mild mitral valve regurgitation. No evidence of mitral valve stenosis. Tricuspid Valve: The tricuspid valve is normal in structure. Tricuspid valve regurgitation is moderate . No evidence of tricuspid stenosis. Aortic Valve: The aortic valve is  tricuspid. Aortic valve regurgitation is moderate to severe. Aortic regurgitation PHT measures 376 msec. Mild aortic valve sclerosis is present, with no evidence of aortic valve stenosis. Aortic valve mean gradient measures 2.0 mmHg. Aortic valve peak gradient measures 4.9 mmHg. Pulmonic Valve: The pulmonic valve was normal in structure. Pulmonic valve regurgitation is trivial. No evidence of pulmonic stenosis. Aorta: The aortic root is normal in size and structure. Venous: The inferior vena cava is normal in size with greater than 50% respiratory variability, suggesting right atrial pressure of 3 mmHg. IAS/Shunts: No atrial level shunt detected by color flow Doppler. Additional Comments: Global hypokinesis with akinesis of the distal anteroseptal wall, apex and distal inferior wall; overall severe LV dysfunction. LV function worse compared to previous. A device lead is visualized.  LEFT VENTRICLE PLAX 2D LVIDd:         4.70 cm LVIDs:         3.90 cm     2D Longitudinal Strain LV PW:         1.10 cm     2D Strain GLS Avg:     -7.0 % LV IVS:        1.00 cm LVOT diam:     1.80 cm LV SV:         36 LV SV Index:   24          3D Volume EF: LVOT Area:     2.54 cm    3D EF:        36 %                            LV EDV:       108 ml                            LV ESV:       69 ml LV Volumes (MOD)           LV SV:        39 ml LV vol d, MOD A2C: 80.0 ml LV  vol d, MOD A4C: 66.2 ml LV vol s, MOD A2C: 57.9 ml LV vol s, MOD A4C: 42.1 ml LV SV MOD A2C:     22.1 ml LV SV MOD A4C:     66.2 ml LV SV MOD BP:      24.8 ml RIGHT VENTRICLE            IVC RV S prime:     5.00 cm/s  IVC diam: 2.00 cm TAPSE (M-mode): 0.7 cm LEFT ATRIUM             Index       RIGHT ATRIUM           Index LA diam:        3.90 cm 2.67 cm/m  RA Area:     19.60 cm LA Vol (A2C):   92.0 ml 63.02 ml/m RA Volume:   58.10 ml  39.80 ml/m LA Vol (A4C):   60.5 ml 41.44 ml/m LA Biplane Vol: 79.8 ml 54.66 ml/m  AORTIC VALVE                   PULMONIC VALVE AV  Area (Vmax):    1.59 cm    PR End Diast Vel: 1.62 msec AV Area (Vmean):   1.46 cm AV Area (VTI):     1.70 cm AV Vmax:           111.00 cm/s AV Vmean:          67.100 cm/s AV VTI:            0.209 m AV Peak Grad:      4.9 mmHg AV Mean Grad:      2.0 mmHg LVOT Vmax:         69.50 cm/s LVOT Vmean:        38.600 cm/s LVOT VTI:          0.140 m LVOT/AV VTI ratio: 0.67 AI PHT:            376 msec  AORTA Ao Root diam: 3.30 cm Ao Asc diam:  3.40 cm MITRAL VALVE               TRICUSPID VALVE MV Area (PHT): 4.83 cm    TR Peak grad:   34.6 mmHg MV Decel Time: 157 msec    TR Vmax:        294.00 cm/s MV E velocity: 65.95 cm/s                            SHUNTS                            Systemic VTI:  0.14 m                            Systemic Diam: 1.80 cm Kirk Ruths MD Electronically signed by Kirk Ruths MD Signature Date/Time: 06/22/2021/1:53:01 PM    Final      Scheduled Meds:    carvedilol  12.5 mg Oral BID WC   Chlorhexidine Gluconate Cloth  6 each Topical Daily   fenofibrate  160 mg Oral Daily   insulin aspart  0-9 Units Subcutaneous TID WC   levothyroxine  112 mcg Oral QAC breakfast   losartan  100 mg Oral Daily   pantoprazole  40 mg Oral Daily   potassium chloride SA  20 mEq Oral Daily    Continuous Infusions:     LOS: 0 days     Vernell Leep, MD, [place], [facility]. [facility]    To contact the attending provider between 7A-7P or the covering provider during after hours 7P-7A, please log into the web site [email or web address] and access using universal [place] password for that web site. If you do not have the password, please call the hospital operator.  06/22/2021, 4:14 PM

## 2021-06-22 NOTE — Progress Notes (Signed)
HEMATOLOGY-ONCOLOGY PROGRESS NOTE  SUBJECTIVE: [name] is followed by Dr. Julien Nordmann for extensive stage small cell lung cancer.  She is currently receiving maintenance Imfinzi.  This was last given on 06/01/2021.  She is now admitted due to severe hypokalemia and hypomagnesemia.  She also was having shortness of breath productive cough on admission.  When I saw the patient this morning, she was eating breakfast.  She tells me that she came to the hospital due to abdominal pain with diarrhea.  She did not mention anything about being short of breath or having a cough to me.  This morning, she states that she is not currently having any abdominal pain.  She states that she has not had any diarrhea since admission.  Was somewhat nonspecific about how long she has had diarrhea.  States that she has tried Imodium which has been ineffective.  She was noted to be coughing during my visit with her.  She denies shortness of breath.  Oncology History  Small cell lung cancer, right ([place])  01/28/2020 Initial Diagnosis   Small cell lung cancer, right ([place])   02/10/2020 -  Chemotherapy    Patient is on Treatment Plan: LUNG SMALL CELL EXTENSIVE STAGE DURVALUMAB + CARBOPLATIN D1 + ETOPOSIDE D1-3 Q21D X 4 CYCLES / DURVALUMAB Q28D       11/24/2020 Cancer Staging   Staging form: Lung, AJCC 8th Edition - Clinical: Stage IVB (cT2a, cN2, cM1c) - Signed by Curt Bears, MD on 11/24/2020      REVIEW OF SYSTEMS:   A 10 point review of systems is negative symptoms noted in the HPI.  I have reviewed the past medical history, past surgical history, social history and family history with the patient and they are unchanged from previous note.   PHYSICAL EXAMINATION: ECOG PERFORMANCE STATUS: 2 - Symptomatic, <50% confined to bed  Vitals:   06/22/21 0750 06/22/21 1300  BP: 126/72   Pulse: 72   Resp: 18 16  Temp: 98.3 F (36.8 C)   SpO2: 97%    Filed Weights   06/21/21 1538 06/21/21 1900  Weight: 48 kg 48.1  kg    Intake/Output from previous day: 09/21 0701 - 09/22 0700 In: 1300.6 [P.O.:120; IV Piggyback:1180.6] Out: 500 [Urine:500]  GENERAL:alert, no distress and comfortable SKIN: skin color, texture, turgor are normal, no rashes or significant lesions EYES: normal, Conjunctiva are pink and non-injected, sclera clear LUNGS: Diminished breath sounds throughout HEART: Irregularly irregular, no lower extremity edema ABDOMEN:abdomen soft, non-tender and normal bowel sounds NEURO: alert & oriented x 3 with fluent speech, no focal motor/sensory deficits  LABORATORY DATA:  I have reviewed the data as listed CMP Latest Ref Rng & Units 06/22/2021 06/21/2021 06/21/2021  Glucose 70 - 99 mg/dL 67(L) - 100(H)  BUN 8 - 23 mg/dL 10 - 12  Creatinine 0.44 - 1.00 mg/dL 0.32(L) - 0.40(L)  Sodium 135 - 145 mmol/L 144 - 140  Potassium 3.5 - 5.1 mmol/L 3.9 2.8(L) 2.2(LL)  Chloride 98 - 111 mmol/L 114(H) - 103  CO2 22 - 32 mmol/L 24 - 28  Calcium 8.9 - 10.3 mg/dL 8.0(L) - 8.0(L)  Total Protein 6.5 - 8.1 g/dL - - -  Total Bilirubin 0.3 - 1.2 mg/dL - - -  Alkaline Phos 38 - 126 U/L - - -  AST 15 - 41 U/L - - -  ALT 0 - 44 U/L - - -    Lab Results  Component Value Date   WBC 4.7 06/22/2021  HGB 10.2 (L) 06/22/2021   HCT 33.5 (L) 06/22/2021   MCV 98.5 06/22/2021   PLT 140 (L) 06/22/2021   NEUTROABS 3.7 06/21/2021    DG Chest 2 View  Result Date: 06/21/2021 CLINICAL DATA:  Cough, history of lung cancer EXAM: CHEST - 2 VIEW COMPARISON:  03/18/2021 FINDINGS: Left chest port catheter. Cardiomegaly with right chest multi lead pacer defibrillator. No acute airspace opacity. Disc degenerative disease of the thoracic spine. IMPRESSION: Cardiomegaly without acute abnormality of the lungs. Electronically Signed   By: Eddie Candle M.D.   On: 06/21/2021 14:22   CT CHEST ABDOMEN PELVIS W CONTRAST  Result Date: 06/21/2021 CLINICAL DATA:  Metastatic small-cell lung cancer EXAM: CT CHEST, ABDOMEN, AND PELVIS WITH  CONTRAST TECHNIQUE: Multidetector CT imaging of the chest, abdomen and pelvis was performed following the standard protocol during bolus administration of intravenous contrast. CONTRAST:  15mL OMNIPAQUE IOHEXOL 350 MG/ML SOLN COMPARISON:  03/14/2021 CT chest abdomen pelvis. FINDINGS: CT CHEST FINDINGS Cardiovascular: Coronary, aortic arch, and branch vessel atherosclerotic calcifications. Unchanged mild cardiomegaly. Right chest wall pacer with leads in the right atrial appendage and right ventricle. No pericardial effusion. Mediastinum/Nodes: The thyroid gland is not visualized. No enlarged mediastinal, hilar, or axillary lymph nodes. The trachea is midline. Lungs/Pleura: Advanced emphysema. No focal airspace consolidation, pleural effusion, or pneumothorax. Redemonstrated medial right upper lobe nodule measures 15 x 14 mm (series 4, image 55), previously 15 x 15 mm when remeasured similarly, likely unchanged when accounting for slice selection. 8 mm triangular nodule in the left lower lobe (series 4, image 74), unchanged. Musculoskeletal: No acute osseous abnormality. CT ABDOMEN PELVIS FINDINGS Hepatobiliary: No focal liver abnormality is seen. Status post cholecystectomy. No biliary dilatation. Pancreas: Unremarkable. No pancreatic ductal dilatation or surrounding inflammatory changes. Spleen: Normal in size without focal abnormality. Adrenals/Urinary Tract: The adrenals are unremarkable. The kidneys enhance symmetrically with no hydronephrosis. Unchanged left mid kidney cyst. No nephrolithiasis. The bladder is unremarkable. Stomach/Bowel: The stomach is within normal limits. No bowel wall thickening or evidence of obstruction. Diverticulosis without evidence of diverticulitis. Vascular/Lymphatic: Aortic atherosclerosis. No enlarged abdominal or pelvic lymph nodes. Reproductive: Uterus and bilateral adnexa are unremarkable. Other: No free fluid or free air in the abdomen or pelvis. Musculoskeletal: No acute  osseous abnormality. Right femoral nail. Unchanged sclerosis in the left iliac bone. IMPRESSION: 1. Stable exam. Unchanged appearance of right upper lobe and left lower lobe lung nodules. 2. No evidence of metastatic disease in the abdomen or pelvis. 3. No acute process in the chest, abdomen, or pelvis. 4. Aortic Atherosclerosis (ICD10-I70.0) and Emphysema (ICD10-J43.9). Electronically Signed   By: Merilyn Baba M.D.   On: 06/21/2021 19:35   ECHOCARDIOGRAM COMPLETE  Result Date: 06/22/2021    ECHOCARDIOGRAM REPORT   Patient Name:   [name] Date of Exam: 06/22/2021 Medical Rec #:  [id number]   Height:       62.0 in Accession #:    [id number]  Weight:       106.0 lb Date of Birth:  1934-10-25   BSA:          1.460 m Patient Age:    19 years    BP:           126/72 mmHg Patient Gender: F           HR:           65 bpm. Exam Location:  Inpatient Procedure: 2D Echo, 3D Echo, Cardiac Doppler, Color Doppler, Strain Analysis and  Intracardiac Opacification Agent Indications:    R07.9* Chest pain, unspecified. Elevated troponin.  History:        Patient has prior history of Echocardiogram examinations, most                 recent 01/06/2020. Abnormal ECG and Pacemaker, Arrythmias:Atrial                 Fibrillation, Signs/Symptoms:Syncope; Risk Factors:Diabetes,                 Current Smoker and Dyslipidemia. Metastatic cancer.  Sonographer:    Roseanna Rainbow RDCS Referring Phys: Theola Sequin  Sonographer Comments: Global longitudinal strain was attempted. IMPRESSIONS  1. Global hypokinesis with akinesis of the distal anteroseptal wall, apex and distal inferior wall; overall severe LV dysfunction. LV function worse compared to previous.  2. Left ventricular ejection fraction, by estimation, is 25 to 30%. The left ventricle has severely decreased function. The left ventricle demonstrates global hypokinesis. Left ventricular diastolic parameters are indeterminate. The average left ventricular global longitudinal  strain is -7.0 %. The global longitudinal strain is abnormal.  3. Right ventricular systolic function is normal. The right ventricular size is mildly enlarged. There is mildly elevated pulmonary artery systolic pressure.  4. Left atrial size was severely dilated.  5. Right atrial size was moderately dilated.  6. The mitral valve is normal in structure. Mild mitral valve regurgitation. No evidence of mitral stenosis.  7. Tricuspid valve regurgitation is moderate.  8. The aortic valve is tricuspid. Aortic valve regurgitation is moderate to severe. Mild aortic valve sclerosis is present, with no evidence of aortic valve stenosis.  9. The inferior vena cava is normal in size with greater than 50% respiratory variability, suggesting right atrial pressure of 3 mmHg. FINDINGS  Left Ventricle: Left ventricular ejection fraction, by estimation, is 25 to 30%. The left ventricle has severely decreased function. The left ventricle demonstrates global hypokinesis. Definity contrast agent was given IV to delineate the left ventricular endocardial borders. The average left ventricular global longitudinal strain is -7.0 %. The global longitudinal strain is abnormal. The left ventricular internal cavity size was normal in size. There is no left ventricular hypertrophy. Left ventricular diastolic parameters are indeterminate. Right Ventricle: The right ventricular size is mildly enlarged. Right ventricular systolic function is normal. There is mildly elevated pulmonary artery systolic pressure. The tricuspid regurgitant velocity is 2.94 m/s, and with an assumed right atrial pressure of 3 mmHg, the estimated right ventricular systolic pressure is 09.9 mmHg. Left Atrium: Left atrial size was severely dilated. Right Atrium: Right atrial size was moderately dilated. Pericardium: There is no evidence of pericardial effusion. Mitral Valve: The mitral valve is normal in structure. Mild mitral valve regurgitation. No evidence of mitral valve  stenosis. Tricuspid Valve: The tricuspid valve is normal in structure. Tricuspid valve regurgitation is moderate . No evidence of tricuspid stenosis. Aortic Valve: The aortic valve is tricuspid. Aortic valve regurgitation is moderate to severe. Aortic regurgitation PHT measures 376 msec. Mild aortic valve sclerosis is present, with no evidence of aortic valve stenosis. Aortic valve mean gradient measures 2.0 mmHg. Aortic valve peak gradient measures 4.9 mmHg. Pulmonic Valve: The pulmonic valve was normal in structure. Pulmonic valve regurgitation is trivial. No evidence of pulmonic stenosis. Aorta: The aortic root is normal in size and structure. Venous: The inferior vena cava is normal in size with greater than 50% respiratory variability, suggesting right atrial pressure of 3 mmHg. IAS/Shunts: No atrial level shunt detected  by color flow Doppler. Additional Comments: Global hypokinesis with akinesis of the distal anteroseptal wall, apex and distal inferior wall; overall severe LV dysfunction. LV function worse compared to previous. A device lead is visualized.  LEFT VENTRICLE PLAX 2D LVIDd:         4.70 cm LVIDs:         3.90 cm     2D Longitudinal Strain LV PW:         1.10 cm     2D Strain GLS Avg:     -7.0 % LV IVS:        1.00 cm LVOT diam:     1.80 cm LV SV:         36 LV SV Index:   24          3D Volume EF: LVOT Area:     2.54 cm    3D EF:        36 %                            LV EDV:       108 ml                            LV ESV:       69 ml LV Volumes (MOD)           LV SV:        39 ml LV vol d, MOD A2C: 80.0 ml LV vol d, MOD A4C: 66.2 ml LV vol s, MOD A2C: 57.9 ml LV vol s, MOD A4C: 42.1 ml LV SV MOD A2C:     22.1 ml LV SV MOD A4C:     66.2 ml LV SV MOD BP:      24.8 ml RIGHT VENTRICLE            IVC RV S prime:     5.00 cm/s  IVC diam: 2.00 cm TAPSE (M-mode): 0.7 cm LEFT ATRIUM             Index       RIGHT ATRIUM           Index LA diam:        3.90 cm 2.67 cm/m  RA Area:     19.60 cm LA Vol  (A2C):   92.0 ml 63.02 ml/m RA Volume:   58.10 ml  39.80 ml/m LA Vol (A4C):   60.5 ml 41.44 ml/m LA Biplane Vol: 79.8 ml 54.66 ml/m  AORTIC VALVE                   PULMONIC VALVE AV Area (Vmax):    1.59 cm    PR End Diast Vel: 1.62 msec AV Area (Vmean):   1.46 cm AV Area (VTI):     1.70 cm AV Vmax:           111.00 cm/s AV Vmean:          67.100 cm/s AV VTI:            0.209 m AV Peak Grad:      4.9 mmHg AV Mean Grad:      2.0 mmHg LVOT Vmax:         69.50 cm/s LVOT Vmean:        38.600 cm/s LVOT VTI:          0.140 m LVOT/AV  VTI ratio: 0.67 AI PHT:            376 msec  AORTA Ao Root diam: 3.30 cm Ao Asc diam:  3.40 cm MITRAL VALVE               TRICUSPID VALVE MV Area (PHT): 4.83 cm    TR Peak grad:   34.6 mmHg MV Decel Time: 157 msec    TR Vmax:        294.00 cm/s MV E velocity: 65.95 cm/s                            SHUNTS                            Systemic VTI:  0.14 m                            Systemic Diam: 1.80 cm Kirk Ruths MD Electronically signed by Kirk Ruths MD Signature Date/Time: 06/22/2021/1:53:01 PM    Final     ASSESSMENT AND PLAN: This is an 86 year old female with extensive stage small cell lung cancer who presented with a central right upper lobe lung mass in addition to left lower lobe lung nodule and mediastinal lymphadenopathy and metastatic bone lesion in the left iliac wing diagnosed April 2021.  She received carboplatin for an AUC of 5 on day 1 with etoposide 100 mg per metered squared on days 1, 2, and 3 with Neulasta in addition to Imfinzi 1500 mg every 3 weeks followed by a course of Imfinzi 1500 mg every 4 weeks as maintenance.  Status post 18 cycles.  Last cycle was given on 06/01/2021.  Now admitted with severe hypokalemia and hypomagnesemia and shortness of breath.  She had a CT of the chest/abdomen/pelvis with contrast performed on admission on 06/21/2021 which was a stable exam, no evidence of metastatic disease in the abdomen or pelvis and no acute process in the  chest, abdomen, or pelvis.  Unclear on the etiology of her shortness of breath.  She was not wearing oxygen when I saw her this morning.  Recommend symptomatic management.  Follow-up with Dr. Julien Nordmann as previously scheduled for continuation of her immunotherapy.  We had a lengthy discussion regarding her abdominal pain and diarrhea.  I am not clear on the frequency of diarrhea or how long its been going on.  Given that she is on immunotherapy, immune mediated colitis is always a concern.  However, the history that she gave me does not really fit this clinical scenario.  I would be concerned if she was having diarrhea 6-7 times per day despite taking Imodium on a regular basis.  She did not report this to me today.  However, if she develops diarrhea to this extent, would recommend a course of prednisone 1 mg/kg daily.  Please let me know if her diarrhea worsens and we will be happy to reevaluate her.  Medical oncology will not continue to follow this patient while she is in the hospital.  Please call us if questions arise.  Outpatient follow-up appointments as noted below.  Future Appointments  Date Time Provider [place]  06/29/2021  8:15 AM [facility] None  06/29/2021  8:45 AM Curt Bears, MD [facility] None  06/29/2021  9:30 AM [facility] INFUSION [facility] None  06/29/2021  9:45 AM Morrell Riddle,  RD [facility] None  07/03/2021  7:00 AM [facility]TUMOR BOARD CONFERENCE [facility] None  07/05/2021 12:00 PM [facility] PHARMACIST [facility]  07/07/2021  2:20 PM [name], MD [facility] None  07/27/2021  9:00 AM [facility] [place] FLUSH [facility] None  07/27/2021  9:30 AM Curt Bears, MD [facility] None  07/27/2021 10:30 AM [facility] INFUSION [facility] None  08/09/2021  7:15 AM [facility] DEVICE REMOTES [facility]  08/22/2021 11:00 AM [facility]-NURSE HEALTH ADVISOR 2 [facility]  11/08/2021  7:15 AM [facility] DEVICE REMOTES [facility]  [facility]  02/07/2022  7:15 AM [facility] DEVICE REMOTES [facility]      LOS: 0 days   Mikey Bussing, DNP, AGPCNP-BC, AOCNP 06/22/21

## 2021-06-22 NOTE — Progress Notes (Signed)
  Echocardiogram 2D Echocardiogram has been performed.  [name] 06/22/2021, 12:25 PM

## 2021-06-22 NOTE — Evaluation (Signed)
Physical Therapy Evaluation Patient Details Name: [name] MRN: [id number] DOB: 10-01-1935 Today's Date: 06/22/2021  History of Present Illness  86 y.o. female  was brought in by by her daughter for generalized weakness and worsening cough. Dx of severe hypokalemia. Pt with medical history significant of metastatic small cell lung cancer with pulmonary mets, bilateral hilar lymphadenopathy, bone mets, brain mets, s/p 18 cycles of chemotherapy last cycle on 06/01/2021, s/p whole brain irradiation  completed on 03/29/2021 , COPD, diabetes mellitus, hypertension, hypothyroidism, paroxysmal atrial fibrillation, tachybradycardia syndrome s/p PPM  Clinical Impression  Pt admitted with above diagnosis. Pt ambulated 61' with RW, distance limited by fatigue. Pt reports she has 24* assistance available at home. HHPT recommended. Pt currently with functional limitations due to the deficits listed below (see PT Problem List). Pt will benefit from skilled PT to increase their independence and safety with mobility to allow discharge to the venue listed below.          Recommendations for follow up therapy are one component of a multi-disciplinary discharge planning process, led by the attending physician.  Recommendations may be updated based on patient status, additional functional criteria and insurance authorization.  Follow Up Recommendations Home health PT    Equipment Recommendations  None recommended by PT    Recommendations for Other Services       Precautions / Restrictions Precautions Precautions: Fall Precaution Comments: pt reports having a fall ~1 month ago Restrictions Weight Bearing Restrictions: No      Mobility  Bed Mobility               General bed mobility comments: up in recliner    Transfers Overall transfer level: Needs assistance Equipment used: Rolling walker (2 wheeled) Transfers: Sit to/from Stand Sit to Stand: Min assist         General transfer  comment: VCs hand placement, min A to power up  Ambulation/Gait Ambulation/Gait assistance: Min guard Gait Distance (Feet): 70 Feet Assistive device: Rolling walker (2 wheeled) Gait Pattern/deviations: Step-through pattern;Decreased stride length Gait velocity: decr   General Gait Details: VCs for proximity to [employer] Mobility    Modified Rankin (Stroke Patients Only)       Balance Overall balance assessment: History of Falls;Needs assistance   Sitting balance-Leahy Scale: Good       Standing balance-Leahy Scale: Fair                               Pertinent Vitals/Pain Pain Assessment: No/denies pain    Home Living Family/patient expects to be discharged to:: Private residence Living Arrangements: Children Available Help at Discharge: Family;Available 24 hours/day Type of Home: House Home Access: Stairs to enter Entrance Stairs-Rails: None Entrance Stairs-Number of Steps: 2 Home Layout: One level Home Equipment: Walker - 2 wheels;Bedside commode;Shower seat      Prior Function Level of Independence: Independent with assistive device(s)         Comments: walks with RW; daughter assists with showering     Hand Dominance        Extremity/Trunk Assessment   Upper Extremity Assessment Upper Extremity Assessment: Overall WFL for tasks assessed    Lower Extremity Assessment Lower Extremity Assessment: Overall WFL for tasks assessed    Cervical / Trunk Assessment Cervical / Trunk Assessment: Normal  Communication   Communication: HOH  Cognition Arousal/Alertness: Awake/alert Behavior During  Therapy: WFL for tasks assessed/performed Overall Cognitive Status: Within Functional Limits for tasks assessed                                        General Comments      Exercises     Assessment/Plan    PT Assessment Patient needs continued PT services  PT Problem List Decreased  mobility;Decreased activity tolerance       PT Treatment Interventions Gait training;Therapeutic activities;Therapeutic exercise;Functional mobility training;Patient/family education    PT Goals (Current goals can be found in the Care Plan section)  Acute Rehab PT Goals Patient Stated Goal: to have more energy PT Goal Formulation: With patient Time For Goal Achievement: 07/06/21 Potential to Achieve Goals: Good    Frequency Min 3X/week   Barriers to discharge        Co-evaluation               AM-PAC PT "6 Clicks" Mobility  Outcome Measure Help needed turning from your back to your side while in a flat bed without using bedrails?: A Little Help needed moving from lying on your back to sitting on the side of a flat bed without using bedrails?: A Little Help needed moving to and from a bed to a chair (including a wheelchair)?: A Little Help needed standing up from a chair using your arms (e.g., wheelchair or bedside chair)?: A Little Help needed to walk in hospital room?: A Little Help needed climbing 3-5 steps with a railing? : A Little 6 Click Score: 18    End of Session Equipment Utilized During Treatment: Gait belt Activity Tolerance: Patient tolerated treatment well Patient left: in chair;with call bell/phone within reach;with chair alarm set Nurse Communication: Mobility status PT Visit Diagnosis: Difficulty in walking, not elsewhere classified (R26.2)    Time: 1037-1050 PT Time Calculation (min) (ACUTE ONLY): 13 min   Charges:   PT Evaluation $PT Eval Moderate Complexity: 1 Mod         Philomena Doheny PT 06/22/2021  Acute Rehabilitation Services Pager [phone] Office [phone]

## 2021-06-22 NOTE — TOC Progression Note (Signed)
Transition of Care [facility]) - Progression Note    Patient Details  Name: [name] MRN: [id number] Date of Birth: 05-15-1935  Transition [facility] Care [facility] CM/SW Contact  Purcell Mouton, RN Phone Number: 06/22/2021, 2:51 PM  Clinical Narrative:    Spoke with pt's daughter [name] who informed CM that [name] is the [facility] and live with pt. [name] states that her mother(pt), is asking MD for Palliative or Hospice. TOC will continue to follow.    Expected Discharge Plan: [place] Barriers to Discharge: No Barriers Identified  Expected Discharge Plan and Services Expected Discharge Plan: [place] arrangements for the past 2 months: Single Family Home                                       Social Determinants of Health (SDOH) Interventions    Readmission Risk Interventions No flowsheet data found.

## 2021-06-22 NOTE — Consult Note (Addendum)
Cardiology Consultation:   Patient ID: [name] MRN: [id number]; DOB: 11-28-1934  Admit date: 06/21/2021 Date of Consult: 06/22/2021  PCP:  [name], [name], MD   [facility] Cardiologist:  Robin Grooms, MD  Electrophysiologist:  Robin Haw, MD       Patient Profile:   [name] is a 86 y.o. female with a hx of metastatic small cell lung cancer with pulmonary mets, lymphadenopathy, bone mets, brain mets, COPD, HTN, hypothyroidism, permanent atrial fibrillation (per EP note), tachy-brady syndrome s/p BosSci PPM, CAD (PTCA 1997, DES to Cx 2005), DM, chronic appearing anemia who is being seen 06/22/2021 for the evaluation of elevated troponin at the request of [name].   History of Present Illness:   [name] was previously seen by [name] last in 2019 but more recent visits have been with APP and [name]. Last cath 2017 showing diffuse nonobstructive CAD (30% prox-distal RCA, 40% prox LAd, 45% mid-distal LAD, 10% Cx stent), managed medically at that time. Last echo EF 55-60%, grade 1 DD, mildly elevated PASP, severely dilated LA, mild RAE, mild AI. Anticoagulation history is somewhat challenging to follow. She was previously on Xarelto then switched to Eliquis at one point. Per oncology note 03/2021, "Her blood thinner on hold due to some bleeding noted on her CT of the head, and per [name] will resume after she has completed radiation." She followed up with [name] that day and note indicated to continue Xarelto. She currently does not have either anticoagulant on medication list at this juncture. She underwent brain radiation earlier this summer and has been treated with ongoing chemotherapy.  She presented to the hospital with generalized weakness, cough, chills, nausea, 1 episode of vomiting, and watery diarrhea for 2 weeks (although has had chronic diarrhea as well). EDP note mentions patient was Covid positive but her lab testing was negative  and she is not on isolation. Labs show severe hypokalemia of 2.2, hypomagnesemia of 1.6, Hgb 10.7 (similar to prior), mild thrombocytopenia 139. Troponin incidentally found to elevated - 675 ->916 ->721. EKG shows known atrial fib with QT prolongation and deep TWI precordial leads, mild TWI I, avL - previously had TW changes in these territories but much more pronounced this admission. + SOB/DOE, but no chest pain or syncope reported. CXR cardiomegaly without acute abnormality. CT chest/abd/pevlis showed no new acute findings. Oncology also notified of admission.   Past Medical History:  Diagnosis Date   Anemia    CAD (coronary artery disease), native coronary artery    PTCA of distal right coronary artery 1997 Cypher stents to circumflex 2005 Cardiac cath in 2011 with patent stent to circumflex and moderate disease elsewhere treated medically    Cardiac pacemaker in situ 12/24/2015   Original implant reportedly in 1991 for tachybradycardia syndrome, generator change in 1999, 2004 and evidently again in 2016 in [place]    COPD (chronic obstructive pulmonary disease) ([place])    Diabetes mellitus without complication ([place])    Hypertension    Hypothyroidism 03/23/2010   Lung cancer ([place])    Pacemaker    Permanent atrial fibrillation ([place]) 03/23/2010    Past Surgical History:  Procedure Laterality Date   BIOPSY  01/08/2020   Procedure: BIOPSY;  Surgeon: Robin Nash, DO;  Location: [facility];  Service: Pulmonary;;   BRONCHIAL BRUSHINGS  01/08/2020   Procedure: BRONCHIAL BRUSHINGS;  Surgeon: Robin Nash, DO;  Location: [facility];  Service: Pulmonary;;   BRONCHIAL  WASHINGS  01/08/2020   Procedure: BRONCHIAL WASHINGS;  Surgeon: Robin Nash, DO;  Location: [facility];  Service: Pulmonary;;   CARDIAC CATHETERIZATION N/A 12/26/2015   Procedure: Left Heart Cath and Coronary Angiography;  Surgeon: Robin M [name], MD;  Location: [facility];  Service: Cardiovascular;   Laterality: N/A;   CARDIOVERSION  05/2015   CHOLECYSTECTOMY  2012   CORONARY ANGIOPLASTY WITH STENT PLACEMENT  1990   ENDOBRONCHIAL ULTRASOUND  01/08/2020   Procedure: ENDOBRONCHIAL ULTRASOUND;  Surgeon: Robin Nash, DO;  Location: [facility];  Service: Pulmonary;;   FINE NEEDLE ASPIRATION BIOPSY  01/08/2020   Procedure: FINE NEEDLE ASPIRATION BIOPSY;  Surgeon: Robin Nash, DO;  Location: [facility];  Service: Pulmonary;;   HERNIA REPAIR     IR IMAGING GUIDED PORT INSERTION  02/18/2020   PACEMAKER GENERATOR CHANGE  [id number], 2016   PACEMAKER INSERTION  1991   VIDEO BRONCHOSCOPY WITH ENDOBRONCHIAL ULTRASOUND N/A 01/08/2020   Procedure: VIDEO BRONCHOSCOPY;  Surgeon: Robin Nash, DO;  Location: [facility];  Service: Pulmonary;  Laterality: N/A;     Home Medications:  Prior to Admission medications   Medication Sig Start Date End Date Taking? Authorizing Provider  acetaminophen (TYLENOL) 500 MG tablet Take 500 mg by mouth every 6 (six) hours as needed for headache.   Yes [provider]  albuterol (VENTOLIN HFA) 108 (90 Base) MCG/ACT inhaler TAKE 2 PUFFS BY MOUTH EVERY 6 HOURS AS NEEDED FOR WHEEZE OR SHORTNESS OF BREATH Patient taking differently: Inhale 2 puffs into the lungs every 6 (six) hours as needed for wheezing or shortness of breath. 05/25/20  Yes [name], [name], MD  atorvastatin (LIPITOR) 10 MG tablet TAKE 1 TABLET BY MOUTH EVERY DAY Patient taking differently: Take 10 mg by mouth daily. 06/08/21  Yes Robin Crome, MD  carvedilol (COREG) 12.5 MG tablet Take 1 tablet (12.5 mg total) by mouth 2 (two) times daily with a meal. 03/29/21  Yes [name], [name], MD  fenofibrate (TRICOR) 48 MG tablet Take 48 mg by mouth daily. 05/27/21  Yes [provider]  fluconazole (DIFLUCAN) 100 MG tablet Take 2 tablets by mouth today, then 1 tablet daily x 20 more days. Hold Atorvastatin while on this. 05/12/21  Yes Robin Gibson, MD  KLOR-CON M20 20 MEQ tablet TAKE 1 TABLET  BY MOUTH TWICE A DAY FOR 2 DAYS THEN TAKE 1 TABLET BY MOUTH EVERY DAY Patient taking differently: Take 20 mEq by mouth daily. 03/10/21  Yes [name], [name], MD  levothyroxine (SYNTHROID) 112 MCG tablet Take 1 tablet (112 mcg total) by mouth daily before breakfast. 11/07/20  Yes [name], [name], MD  linagliptin (TRADJENTA) 5 MG TABS tablet Take 1 tablet (5 mg total) by mouth daily. 04/26/21  Yes [name], [name], MD  Loperamide HCl (IMODIUM PO) Take by mouth as needed.   Yes [provider]  losartan (COZAAR) 100 MG tablet TAKE 1 TABLET BY MOUTH EVERY DAY Patient taking differently: Take 100 mg by mouth daily. 05/12/21  Yes [name], [name], MD  nitroGLYCERIN (NITROSTAT) 0.4 MG SL tablet Place 1 tablet (0.4 mg total) under the tongue every 5 (five) minutes x 3 doses as needed for chest pain (Max 3 doses within 15 min. Call 911). 05/26/20  Yes Sherran Needs, NP  pantoprazole (PROTONIX) 40 MG tablet TAKE 1 TABLET BY MOUTH EVERY DAY Patient taking differently: Take 40 mg by mouth daily. 03/29/21  Yes [name], [name], MD  prochlorperazine (COMPAZINE)  10 MG tablet Take 1 tablet (10 mg total) by mouth every 6 (six) hours as needed for nausea or vomiting. 02/05/20  Yes Curt Bears, MD  diclofenac sodium (VOLTAREN) 1 % GEL Apply 4 [name] topically 4 (four) times daily. Patient not taking: Reported on 06/21/2021 11/12/18   [name], [name], MD  fluticasone-salmeterol (ADVAIR [facility]) [phone] MCG/ACT inhaler Inhale 2 puffs into the lungs 2 (two) times daily. Patient not taking: Reported on 06/21/2021 04/26/21   [name], [name], MD  glucose blood [facility] VERIO) test strip Use to test 3-4 times daily. 09/08/19   [name], [name], MD  lidocaine-prilocaine (EMLA) cream Apply 1 application topically as needed. Patient not taking: Reported on 06/21/2021 04/11/20   Heilingoetter, Cassandra L, PA-C  nicotine (NICODERM CQ - DOSED IN MG/24 HOURS) 14 mg/24hr patch PLACE 1 PATCH ONTO THE SKIN DAILY. Patient not taking: Reported on  06/21/2021 02/20/21   [name], [name], MD  Spacer/Aero-Holding Chambers (AEROCHAMBER PLUS) inhaler Use as instructed with inahaler. 04/26/21   [name], [name], MD  triamcinolone (KENALOG) 0.025 % ointment Apply 1 application topically 2 (two) times daily. Patient not taking: Reported on 06/21/2021 04/15/21   Scot Jun, FNP    Inpatient Medications: Scheduled Meds:  carvedilol  12.5 mg Oral BID WC   Chlorhexidine Gluconate Cloth  6 each Topical Daily   fenofibrate  160 mg Oral Daily   fluconazole  200 mg Oral Daily   insulin aspart  0-9 Units Subcutaneous TID WC   levothyroxine  112 mcg Oral QAC breakfast   pantoprazole  40 mg Oral Daily   potassium chloride  40 mEq Oral BID   Continuous Infusions:  PRN Meds: acetaminophen **OR** acetaminophen, albuterol, guaiFENesin-dextromethorphan, loperamide HCl, nitroGLYCERIN, ondansetron **OR** ondansetron (ZOFRAN) IV, sodium chloride flush  Allergies:    Allergies  Allergen Reactions   Seasonal Ic [Cholestatin]     Itchy eye and runny nose    Social History:   Social History   Socioeconomic History   Marital status: Widowed    Spouse name: Not on file   Number of children: Not on file   Years of education: Not on file   Highest education level: Not on file  Occupational History   Occupation: retired  Tobacco Use   Smoking status: Some Days    Packs/day: 0.25    Years: 66.00    Pack years: 16.50    Types: Cigarettes    Last attempt to quit: 12/27/2019    Years since quitting: 1.4   Smokeless tobacco: Never   Tobacco comments:    03/16/21: Per pt, she recently began smoking 2-3 cigs a week but plans to stop again..  Vaping Use   Vaping Use: Never used  Substance and Sexual Activity   Alcohol use: No    Alcohol/week: 0.0 standard drinks   Drug use: No   Sexual activity: Not on file  Other Topics Concern   Not on file  Social History Narrative   Not on file   Social Determinants of Health   Financial Resource  Strain: Low Risk    Difficulty of Paying Living Expenses: Not hard at all  Food Insecurity: No Food Insecurity   Worried About Charity fundraiser in the Last Year: Never true   Anna in the Last Year: Never true  Transportation Needs: No Transportation Needs   Lack of Transportation (Medical): No   Lack of Transportation (Non-Medical): No  Physical Activity: Inactive   Days  of Exercise per Week: 0 days   Minutes of Exercise per Session: 0 min  Stress: No Stress Concern Present   Feeling of Stress : Not at all  Social Connections: Socially Isolated   Frequency of Communication with Friends and Family: More than three times a week   Frequency of Social Gatherings with Friends and Family: Never   Attends Religious Services: Never   Marine scientist or Organizations: No   Attends Archivist Meetings: Never   Marital Status: Widowed  Human resources officer Violence: Not At Risk   Fear of Current or Ex-Partner: No   Emotionally Abused: No   Physically Abused: No   Sexually Abused: No    Family History:    Family History  Problem Relation Age of Onset   Heart disease Father    Heart disease Mother    Heart attack Mother    Cirrhosis Brother    Colon cancer Neg Hx    Stomach cancer Neg Hx      ROS:  Please see the history of present illness.  All other ROS reviewed and negative.     Physical Exam/Data:   Vitals:   06/21/21 2300 06/22/21 0452 06/22/21 0700 06/22/21 0750  BP: (!) 113/44 116/64  126/72  Pulse: 63 71  72  Resp: 19 19 16 18   Temp: 98.2 F (36.8 C) 98 F (36.7 C)  98.3 F (36.8 C)  TempSrc: Oral Oral  Oral  SpO2: 100% 97%  97%  Weight:      Height:        Intake/Output Summary (Last 24 hours) at 06/22/2021 0902 Last data filed at 06/22/2021 0900 Gross per 24 hour  Intake 1360.63 ml  Output 700 ml  Net 660.63 ml   Last 3 Weights 06/21/2021 06/21/2021 06/01/2021  Weight (lbs) 106 lb 0.7 oz 105 lb 13.1 oz 103 lb 14.4 oz  Weight (kg)  48.1 kg 48 kg 47.129 kg     Body mass index is 19.4 kg/m.  General: Very frail elderly AAF in no acute distress. Head: Normocephalic, atraumatic, sclera non-icteric, no xanthomas, nares are without discharge. Neck: Negative for carotid bruits. JVP not elevated. Kyphotic posture. Lungs: Diffusely diminished throughout without rales, wheezing or rhonchi Heart: Irregularly irregular, rate controlled S1 S2 without murmurs, rubs, or gallops.  Abdomen: Soft, non-tender, non-distended with normoactive bowel sounds. No rebound/guarding. Extremities: No clubbing or cyanosis. No edema. Distal pedal pulses are 2+ and equal bilaterally. Neuro: Alert and oriented X 3. Moves all extremities spontaneously. Psych:  Responds to questions appropriately with a normal affect.   EKG:  The EKGs were personally reviewed - multiple tracings  - Initial EKG showed atrial fib 67bpm baseline wander/poor quality, suspected diffuse TW changes with possible QT prolongation but difficult to discern.  - Last EKG today showed atrial fib 69bpm with marked STTW changes with deep TWI V1-V5, less pronounced I, avL, prolonged QTc 533ms  She had previous TW changes in these leads but much more pronounced than 2021-04-16  (One tracing last night showed occasional V pacing)  Telemetry:  Telemetry was personally reviewed and demonstrates:  atrial fib rate controlled  Relevant CV Studies: 2D echo 12/2019   1. Left ventricular ejection fraction, by estimation, is 55 to 60%. The  left ventricle has normal function. The left ventricle has no regional  wall motion abnormalities. Left ventricular diastolic parameters are  consistent with Grade I diastolic  dysfunction (impaired relaxation).   2. Right ventricular systolic function is  normal. The right ventricular  size is normal. There is mildly elevated pulmonary artery systolic  pressure. The estimated right ventricular systolic pressure is 94.1 mmHg.   3. Left atrial size was  severely dilated.   4. Right atrial size was mildly dilated.   5. The mitral valve is abnormal. Trivial mitral valve regurgitation.   6. The aortic valve is tricuspid. Aortic valve regurgitation is mild.  Mild aortic valve sclerosis is present, with no evidence of aortic valve  stenosis. Aortic regurgitation PHT measures 350 msec.   7. The inferior vena cava is normal in size with greater than 50%  respiratory variability, suggesting right atrial pressure of 3 mmHg.  Laboratory Data:  High Sensitivity Troponin:   Recent Labs  Lab 06/21/21 1427 06/21/21 1947 06/21/21 2220  TROPONINIHS 675* 916* 721*     Chemistry Recent Labs  Lab 06/21/21 1427 06/21/21 1947 06/22/21 0546  NA 140  --  144  K 2.2* 2.8* 3.9  CL 103  --  114*  CO2 28  --  24  GLUCOSE 100*  --  67*  BUN 12  --  10  CREATININE 0.40*  --  0.32*  CALCIUM 8.0*  --  8.0*  MG 1.6*  --  2.1  GFRNONAA >60  --  >60  ANIONGAP 9  --  6    No results for input(s): PROT, ALBUMIN, AST, ALT, ALKPHOS, BILITOT in the last 168 hours. Lipids No results for input(s): CHOL, TRIG, HDL, LABVLDL, LDLCALC, CHOLHDL in the last 168 hours.  Hematology Recent Labs  Lab 06/21/21 1427 06/22/21 0546  WBC 5.6 4.7  RBC 3.46* 3.40*  HGB 10.7* 10.2*  HCT 33.6* 33.5*  MCV 97.1 98.5  MCH 30.9 30.0  MCHC 31.8 30.4  RDW 13.8 13.9  PLT 139* 140*    Radiology/Studies:  DG Chest 2 View  Result Date: 06/21/2021 CLINICAL DATA:  Cough, history of lung cancer EXAM: CHEST - 2 VIEW COMPARISON:  03/18/2021 FINDINGS: Left chest port catheter. Cardiomegaly with right chest multi lead pacer defibrillator. No acute airspace opacity. Disc degenerative disease of the thoracic spine. IMPRESSION: Cardiomegaly without acute abnormality of the lungs. Electronically Signed   By: Eddie Candle M.D.   On: 06/21/2021 14:22   CT CHEST ABDOMEN PELVIS W CONTRAST  Result Date: 06/21/2021 CLINICAL DATA:  Metastatic small-cell lung cancer EXAM: CT CHEST, ABDOMEN,  AND PELVIS WITH CONTRAST TECHNIQUE: Multidetector CT imaging of the chest, abdomen and pelvis was performed following the standard protocol during bolus administration of intravenous contrast. CONTRAST:  40mL OMNIPAQUE IOHEXOL 350 MG/ML SOLN COMPARISON:  03/14/2021 CT chest abdomen pelvis. FINDINGS: CT CHEST FINDINGS Cardiovascular: Coronary, aortic arch, and branch vessel atherosclerotic calcifications. Unchanged mild cardiomegaly. Right chest wall pacer with leads in the right atrial appendage and right ventricle. No pericardial effusion. Mediastinum/Nodes: The thyroid gland is not visualized. No enlarged mediastinal, hilar, or axillary lymph nodes. The trachea is midline. Lungs/Pleura: Advanced emphysema. No focal airspace consolidation, pleural effusion, or pneumothorax. Redemonstrated medial right upper lobe nodule measures 15 x 14 mm (series 4, image 55), previously 15 x 15 mm when remeasured similarly, likely unchanged when accounting for slice selection. 8 mm triangular nodule in the left lower lobe (series 4, image 74), unchanged. Musculoskeletal: No acute osseous abnormality. CT ABDOMEN PELVIS FINDINGS Hepatobiliary: No focal liver abnormality is seen. Status post cholecystectomy. No biliary dilatation. Pancreas: Unremarkable. No pancreatic ductal dilatation or surrounding inflammatory changes. Spleen: Normal in size without focal abnormality. Adrenals/Urinary Tract: The adrenals  are unremarkable. The kidneys enhance symmetrically with no hydronephrosis. Unchanged left mid kidney cyst. No nephrolithiasis. The bladder is unremarkable. Stomach/Bowel: The stomach is within normal limits. No bowel wall thickening or evidence of obstruction. Diverticulosis without evidence of diverticulitis. Vascular/Lymphatic: Aortic atherosclerosis. No enlarged abdominal or pelvic lymph nodes. Reproductive: Uterus and bilateral adnexa are unremarkable. Other: No free fluid or free air in the abdomen or pelvis.  Musculoskeletal: No acute osseous abnormality. Right femoral nail. Unchanged sclerosis in the left iliac bone. IMPRESSION: 1. Stable exam. Unchanged appearance of right upper lobe and left lower lobe lung nodules. 2. No evidence of metastatic disease in the abdomen or pelvis. 3. No acute process in the chest, abdomen, or pelvis. 4. Aortic Atherosclerosis (ICD10-I70.0) and Emphysema (ICD10-J43.9). Electronically Signed   By: Merilyn Baba M.D.   On: 06/21/2021 19:35     Assessment and Plan:   1. SOB, cough, weakness, diarrhea - multiple medical issues as noted above, with severe hypokalemia and hypomagnesemia superimposed on metastatic lung CA - Covid mentioned in EDP note but testing negative - further management per IM  2. Elevated troponin with known hx of CAD (peak 916) - suspect demand ischemia in the setting of increased metabolic demands, superimposed on underlying CAD - cannot fully exclude ACS but given constellation of symptoms and comorbidities including metastatic cancer with active brain mets, patient is not a candidate for invasive ischemic evaluation, and not a good candidate for additional blood thinners at this time  - therefore would tailor treatment to symptomatic management/QOL rather than aggressive interventions which may do more harm than good - agree with echo to reassess LV function for prognostic data and tailor of medical therapy - continue BB as prescribed - resume statin when OK with primary team  3. Prolonged QT interval - suspect secondary to severe electrolyte abnormalities - primary team managing lytes, recommending keeping K 4.0 or greater and Mg 2.0 or greater - will tentatively dc orders for loperamide / Zofran at this time to allow trending of EKG - will also hold fluconazole that is ordered from one time dose this AM - appreciate IM input on this rx - prescribed 8/12 to take one dose then continue 20 more days, so unclear if needs to be continued  4.  Hypothyroidism - recent TSH 06/01/21 was normal  5. Permanent atrial fibrillation - rate is controlled on home carvedilol - CHADSVASC is 6 - anticoagulation picture was tricky to follow but per in-depth chart review, this was stopped earlier this year after presence of brain mets were identified - oncology note indicated that blood thinner was on hold due to some bleeding noted on her CT of the head, and per [name] will resume after she has completed radiation - will defer timing of resumption to oncology team, appreciate their input  6. H/o PPM - continue usual EP f/u  Risk Assessment/Risk Scores:     TIMI Risk Score for Unstable Angina or Non-ST Elevation MI:   The patient's TIMI risk score is 4, which indicates a 20% risk of all cause mortality, new or recurrent myocardial infarction or need for urgent revascularization in the next 14 days.    CHA2DS2-VASc Score = 6   This indicates a 9.7% annual risk of stroke. The patient's score is based upon: CHF History: 0 HTN History: 1 Diabetes History: 1 Stroke History: 0 Vascular Disease History: 1 Age Score: 2 Gender Score: 1     For questions or updates, please contact [place]  Please consult [email or web address] for contact info under    Signed, Charlie Pitter, PA-C  06/22/2021 9:02 AM   Patient seen and examined.  Agree with below documentation.  Ms. Casella is an 86 year old female with a history of metastatic small cell lung cancer with brain and bone mets, COPD, permanent atrial fibrillation, tachybradycardia syndrome status post PPM, CAD (status post DES to LCx in 2005) T2DM, chronic anemia, hypothyroidism who we are consulted at the request of [name] for evaluation of troponin elevation.  Most recent catheterization was in 2019, which showed diffuse nonobstructive CAD.  Most recent echo showed EF 55 to 11%, grade 1 diastolic dysfunction, severe left atrial enlargement.  She has chronic atrial fibrillation and follows with  [name].  Her anticoagulation has been held by oncology given brain mets.  She presented yesterday with weakness and nausea/vomiting/diarrhea.  Vital signs notable for BP 109/64, pulse 71, SPO2 98% on room air.  Labs notable for creatinine 0.4, potassium 2.2, magnesium 1.6, hemoglobin 10.7, platelets 139, WBC 5.6, troponin 675 > 916 > 721.  EKG shows atrial fibrillation, rate 67.  CT chest shows no acute process, stable lung nodules.  Telemetry shows A. fib, rate 60s to 70s.  On exam, patient is alert and oriented, distant heart sounds, irregular rhythm, diminished breath sounds, no LE edema or JVD.  For her troponin elevation, agree with checking echocardiogram.  Unfortunately she is not a candidate for any invasive evaluation.  If EF reduced, would plan medical management.  Donato Heinz, MD

## 2021-06-22 NOTE — Evaluation (Signed)
Occupational Therapy Evaluation Patient Details Name: [name] MRN: [id number] DOB: April 15, 1935 Today's Date: 06/22/2021   History of Present Illness 86 y.o. female  was brought in by by her daughter for generalized weakness and worsening cough. Dx of severe hypokalemia. Pt with medical history significant of metastatic small cell lung cancer with pulmonary mets, bilateral hilar lymphadenopathy, bone mets, brain mets, s/p 18 cycles of chemotherapy last cycle on 06/01/2021, s/p whole brain irradiation  completed on 03/29/2021 , COPD, diabetes mellitus, hypertension, hypothyroidism, paroxysmal atrial fibrillation, tachybradycardia syndrome s/p PPM   Clinical Impression   [name] is an 86 year old woman who presents with generalized weakness, decreased activity tolerance and impaired balance resulting in a decline in her normal independent self. Patient was min assist to transfer out of bed and min guard with RW to ambulate in room. Patient min assist to rise from toileting and overall min assist for LB ADLs. Patient will benefit from skilled OT services while in hospital to improve deficits and learn compensatory strategies as needed in order to return to PLOF.        Recommendations for follow up therapy are one component of a multi-disciplinary discharge planning process, led by the attending physician.  Recommendations may be updated based on patient status, additional functional criteria and insurance authorization.   Follow Up Recommendations  Home health OT    Equipment Recommendations  None recommended by OT    Recommendations for Other Services       Precautions / Restrictions Precautions Precautions: Fall Precaution Comments: pt reports having a fall ~1 month ago, incontinent of diarrhea Restrictions Weight Bearing Restrictions: No      Mobility Bed Mobility Overal bed mobility: Needs Assistance Bed Mobility: Supine to Sit     Supine to sit: Min assist      General bed mobility comments: min assist for hand hold to transfer in to sitting.    Transfers Overall transfer level: Needs assistance Equipment used: Rolling walker (2 wheeled) Transfers: Sit to/from Stand Sit to Stand: Min assist         General transfer comment: Min assist to stand and min guard to ambulate with RW to bathroom and then to recliner.    Balance Overall balance assessment: Needs assistance Sitting-balance support: No upper extremity supported Sitting balance-Leahy Scale: Fair     Standing balance support: During functional activity Standing balance-Leahy Scale: Fair                             ADL either performed or assessed with clinical judgement   ADL Overall ADL's : Needs assistance/impaired Eating/Feeding: Independent   Grooming: Standing;Wash/dry hands   Upper Body Bathing: Set up;Sitting   Lower Body Bathing: Minimal assistance;Sit to/from stand;Set up   Upper Body Dressing : Set up;Sitting   Lower Body Dressing: Moderate assistance;Sit to/from stand Lower Body Dressing Details (indicate cue type and reason): to don socks Toilet Transfer: Minimal assistance;Regular Toilet;Grab bars;RW Toilet Transfer Details (indicate cue type and reason): min assist to rise from low toilet Toileting- Clothing Manipulation and Hygiene: Minimal assistance Toileting - Clothing Manipulation Details (indicate cue type and reason): asisst with perianal cleaning for thoroughness - patient able to assist with wipuing and clothing managing.     Functional mobility during ADLs: Min guard;Rolling walker       Vision Patient Visual Report: No change from baseline       Perception  Praxis      Pertinent Vitals/Pain Pain Assessment: No/denies pain     Hand Dominance Right   Extremity/Trunk Assessment Upper Extremity Assessment Upper Extremity Assessment: Overall WFL for tasks assessed   Lower Extremity Assessment Lower Extremity  Assessment: Defer to PT evaluation   Cervical / Trunk Assessment Cervical / Trunk Assessment: Kyphotic   Communication Communication Communication: HOH   Cognition Arousal/Alertness: Awake/alert Behavior During Therapy: WFL for tasks assessed/performed Overall Cognitive Status: Within Functional Limits for tasks assessed                                     General Comments       Exercises     Shoulder Instructions      Home Living Family/patient expects to be discharged to:: Private residence Living Arrangements: Children Available Help at Discharge: Family;Available 24 hours/day Type of Home: House Home Access: Stairs to enter [employer] of Steps: 2 Entrance Stairs-Rails: None Home Layout: One level     Bathroom Shower/Tub: Teacher, early years/pre: Standard     Home Equipment: Environmental consultant - 2 wheels;Bedside commode;Shower seat          Prior Functioning/Environment Level of Independence: Independent with assistive device(s)        Comments: walks with RW; daughter assists with showering        OT Problem List: Decreased strength;Decreased activity tolerance;Impaired balance (sitting and/or standing);Decreased knowledge of use of DME or AE      OT Treatment/Interventions: Self-care/ADL training;Therapeutic exercise;DME and/or AE instruction;Therapeutic activities;Patient/family education    OT Goals(Current goals can be found in the care plan section) Acute Rehab OT Goals Patient Stated Goal: get stronger OT Goal Formulation: With patient Time For Goal Achievement: 07/06/21 Potential to Achieve Goals: Good  OT Frequency: Min 2X/week   Barriers to D/C:            Co-evaluation              AM-PAC OT "6 Clicks" Daily Activity     Outcome Measure Help from another person eating meals?: None Help from another person taking care of personal grooming?: A Little Help from another person toileting, which includes  using toliet, bedpan, or urinal?: A Little Help from another person bathing (including washing, rinsing, drying)?: A Little Help from another person to put on and taking off regular upper body clothing?: A Little Help from another person to put on and taking off regular lower body clothing?: A Little 6 Click Score: 19   End of Session Equipment Utilized During Treatment: Rolling walker Nurse Communication: Mobility status (diarrhea x 2)  Activity Tolerance: Patient tolerated treatment well Patient left: in chair;with call bell/phone within reach;with chair alarm set  OT Visit Diagnosis: Muscle weakness (generalized) (M62.81)                Time: 1194-1740 OT Time Calculation (min): 21 min Charges:  OT General Charges $OT Visit: 1 Visit OT Evaluation $OT Eval Low Complexity: 1 Low  [name], OTR/L [place]  Office [phone] Pager: [phone]   Lenward Chancellor 06/22/2021, 4:55 PM

## 2021-06-23 ENCOUNTER — Other Ambulatory Visit: Payer: Self-pay | Admitting: Physician Assistant

## 2021-06-23 ENCOUNTER — Telehealth: Payer: Self-pay | Admitting: Medical Oncology

## 2021-06-23 DIAGNOSIS — I4821 Permanent atrial fibrillation: Secondary | ICD-10-CM | POA: Diagnosis not present

## 2021-06-23 DIAGNOSIS — I5041 Acute combined systolic (congestive) and diastolic (congestive) heart failure: Secondary | ICD-10-CM | POA: Diagnosis not present

## 2021-06-23 DIAGNOSIS — R0600 Dyspnea, unspecified: Secondary | ICD-10-CM

## 2021-06-23 DIAGNOSIS — R9431 Abnormal electrocardiogram [ECG] [EKG]: Secondary | ICD-10-CM

## 2021-06-23 DIAGNOSIS — C3491 Malignant neoplasm of unspecified part of right bronchus or lung: Secondary | ICD-10-CM

## 2021-06-23 DIAGNOSIS — R778 Other specified abnormalities of plasma proteins: Secondary | ICD-10-CM | POA: Diagnosis not present

## 2021-06-23 LAB — CBC
HCT: 33 % — ABNORMAL LOW (ref 36.0–46.0)
Hemoglobin: 10 g/dL — ABNORMAL LOW (ref 12.0–15.0)
MCH: 29.9 pg (ref 26.0–34.0)
MCHC: 30.3 g/dL (ref 30.0–36.0)
MCV: 98.8 fL (ref 80.0–100.0)
Platelets: 142 10*3/uL — ABNORMAL LOW (ref 150–400)
RBC: 3.34 MIL/uL — ABNORMAL LOW (ref 3.87–5.11)
RDW: 13.8 % (ref 11.5–15.5)
WBC: 3.1 10*3/uL — ABNORMAL LOW (ref 4.0–10.5)
nRBC: 0 % (ref 0.0–0.2)

## 2021-06-23 LAB — HEMOGLOBIN A1C
Hgb A1c MFr Bld: 6.8 % — ABNORMAL HIGH (ref 4.8–5.6)
Mean Plasma Glucose: 148 mg/dL

## 2021-06-23 LAB — BASIC METABOLIC PANEL
Anion gap: 6 (ref 5–15)
BUN: 8 mg/dL (ref 8–23)
CO2: 25 mmol/L (ref 22–32)
Calcium: 8.2 mg/dL — ABNORMAL LOW (ref 8.9–10.3)
Chloride: 110 mmol/L (ref 98–111)
Creatinine, Ser: 0.37 mg/dL — ABNORMAL LOW (ref 0.44–1.00)
GFR, Estimated: >60 mL/min (ref >60)
Glucose, Bld: 96 mg/dL (ref 70–99)
Potassium: 3.9 mmol/L (ref 3.5–5.1)
Sodium: 141 mmol/L (ref 135–145)

## 2021-06-23 LAB — GLUCOSE, CAPILLARY
Glucose-Capillary: 106 mg/dL — ABNORMAL HIGH (ref 70–99)
Glucose-Capillary: 114 mg/dL — ABNORMAL HIGH (ref 70–99)

## 2021-06-23 LAB — MAGNESIUM: Magnesium: 1.8 mg/dL (ref 1.7–2.4)

## 2021-06-23 MED ORDER — POTASSIUM CHLORIDE CRYS ER 20 MEQ PO TBCR: 20.0000 meq | EXTENDED_RELEASE_TABLET | Freq: Every day | ORAL | Status: DC

## 2021-06-23 MED ORDER — ENSURE ENLIVE PO LIQD: 237.0000 mL | ORAL | 12 refills | Status: DC

## 2021-06-23 MED ORDER — ENSURE ENLIVE PO LIQD: 237.0000 mL | ORAL | Status: DC

## 2021-06-23 MED ORDER — HEPARIN SOD (PORK) LOCK FLUSH 100 UNIT/ML IV SOLN
500.0000 [IU] | INTRAVENOUS | Status: AC | PRN
  Administered 2021-06-23: 500 [IU]
  Filled 2021-06-23: qty 5

## 2021-06-23 MED ORDER — BOOST / RESOURCE BREEZE PO LIQD CUSTOM
1.0000 | Freq: Two times a day (BID) | ORAL | Status: DC
  Administered 2021-06-23: 1 via ORAL

## 2021-06-23 MED ORDER — ADULT MULTIVITAMIN W/MINERALS CH
1.0000 | ORAL_TABLET | Freq: Every day | ORAL | Status: DC
  Administered 2021-06-23: 1 via ORAL
  Filled 2021-06-23: qty 1

## 2021-06-23 MED ORDER — GUAIFENESIN-DM 100-10 MG/5ML PO SYRP: 5.0000 mL | ORAL_SOLUTION | ORAL | 0 refills | Status: DC | PRN

## 2021-06-23 MED ORDER — ADULT MULTIVITAMIN W/MINERALS CH: 1.0000 | ORAL_TABLET | Freq: Every day | ORAL | Status: AC

## 2021-06-23 MED ORDER — DOXYCYCLINE HYCLATE 100 MG PO TABS: 100.0000 mg | ORAL_TABLET | Freq: Two times a day (BID) | ORAL | 0 refills | Status: AC

## 2021-06-23 NOTE — Discharge Summary (Signed)
Physician Discharge Summary  [name] FHL:[id number] DOB: Jun 11, 1935  PCP: [name], [name], MD  Admitted from: Home Discharged to: Home  Admit date: 06/21/2021 Discharge date: 06/23/2021  Recommendations for Outpatient Follow-up:    Follow-up Information     [name], [name], MD. Schedule an appointment as soon as possible for a visit in 1 week(s).   Specialty: Family Medicine Why: To be seen with repeat labs CBC, CMP, magnesium) and EKG to monitor QTC. Contact information: [place] [address] [phone]         Constance Haw, MD .   Specialty: Cardiology Contact information: [place] [address] [phone]         [name], MD .   Specialty: Cardiology Contact information: [id number] [place] [address] [phone]         Curt Bears, MD Follow up.   Specialty: Oncology Contact information: [place] [address] [place] Orders (From admission, onward)     Start     Ordered   06/23/21 [place]  At discharge       Question:  To provide the following care/treatments  Answer:  PT   06/23/21 1607             Equipment/Devices: None    Discharge Condition: Improved and stable   Code Status: DNR Diet recommendation:  Discharge Diet Orders (From admission, onward)     Start     Ordered   06/23/21 0000  Diet - low sodium heart healthy        06/23/21 1607   06/23/21 0000  Diet Carb Modified        06/23/21 1607             Discharge Diagnoses:  Active Problems:   Hypothyroidism   Paroxysmal atrial fibrillation (HCC)   GERD   Cardiac pacemaker in situ   Type 2 diabetes mellitus with other specified complication (HCC)   Hyperlipidemia associated with type 2 diabetes mellitus (HCC)   Chronic diarrhea   Small cell lung cancer, right ([place])    Metastatic cancer to brain ([place])   Hypokalemia   QT prolongation   Brief Summary: 86 year old female with medical history significant for 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  Patient with history of chronic diarrhea on Imodium.  Admitted for severe hypokalemia/hypomagnesemia secondary to chronic diarrhea and elevated troponins.  Cardiology was consulted.     Assessment & Plan:   Hypokalemia: Suspected secondary to poor oral intake and diarrhea.  Replaced.  At risk for recurrent hypokalemia due to chronic diarrhea.  Counseled regarding high potassium food/diet.  Periodically monitor BMP closely as outpatient  Hypomagnesemia: Secondary to poor oral intake and diarrhea.  Replaced.    Chronic diarrhea: Unclear etiology.?  Viral.  Low index of suspicion for C. difficile.  As per detailed discussion with daughter, patient has had chronic diarrhea up to 3-4 times per day  for almost 2 years, Imodium works well.  On further questioning, this does not appear to be an acute flare.  As per oncology input, they indicate that she is on immunotherapy and immune mediated colitis is always a concern.  She can follow-up regarding this with oncology who could consider prednisone if this worsens.   Elevated troponin: Cath in 2019 showed diffuse nonobstructive CAD.  Most recent echo showed LVEF of 55-60%.  Cardiology input appreciated.  Repeat echocardiogram showed LVEF of 25-30% with moderate to severe aortic insufficiency.  As per cardiology, differential diagnosis includes ischemic  cardiomyopathy with would also be concerned about cardiotoxicity from chemotherapy, as her immunotherapy is associated with myocarditis.  Cardiology team have discussed this with the oncology team who will follow-up with [place] in the office.  They may have to consider alternate regimen for her cancer treatment.  Per cardiology, unfortunately she is not a candidate for invasive evaluation given her metastatic cancer with brain metastasis and recommend medical management including losartan and carvedilol.  They will arrange outpatient follow-up.  Permanent atrial fibrillation: Telemetry shows A. fib with controlled/on demand V paced rhythm.  As per cardiology, anticoagulation on hold given brain mets.  Continue carvedilol.  Repeat echo as above.  Stage IV small cell lung cancer s/p chemotherapy and whole brain radiation: Consulted with oncology.  CT chest abdomen and pelvis with contrast shows stable exam, no evidence of metastatic disease in abdomen or pelvis, no acute process in the chest abdomen or pelvis.  Unsure if her dyspnea is related to lung nodules versus COPD versus others.  She does report productive cough.  Will treat with a brief course of oral doxycycline for suspected acute bronchitis.  Feeling better.  Not hypoxic.   Type II DM: Continue home medication   Hypothyroidism: Continue thyroid replacement.   Anemia of chronic disease: Stable.   Thrombocytopenia: Stable.  Has pancytopenia.?  Related to her cancer treatment.  Follow CBC closely at cancer center during follow-up.  Hyperlipidemia: Her statins were on hold while she was on fluconazole which she appears to have completed (started on 8/12 for 21-day course).  Resume statins.  Essential hypertension: Controlled   Adult failure to thrive: Communicated with oncology team regarding palliative care consultation at the cancer center and they will arrange.  Prolonged QTC: May be related to electrolyte abnormalities and cardiomyopathy.   Peers to have completed fluconazole and will DC from list.  Minimize use of QT prolonging medications and follow EKG for QT prolongation as outpatient.  Unfortunately Imodium is a must for her due to chronic diarrhea.   Body mass index is 19.4 kg/m.   Nutrition Status: Nutrition Problem: Increased nutrient needs Etiology: acute illness, cancer and cancer related treatments, chronic illness Signs/Symptoms: estimated needs Interventions: Ensure Enlive (each supplement provides 350kcal and 20 grams of protein), Boost Breeze, MVI, Snacks           Consultants:   Medical oncology Cardiology   Procedures:   None    Discharge Instructions  Discharge Instructions     Call MD for:   Complete by: As directed    Worsening diarrhea.   Call MD for:  difficulty breathing, headache or visual disturbances   Complete by: As directed    Call MD for:  extreme fatigue   Complete by: As directed    Call MD for:  persistant dizziness or light-headedness   Complete by: As directed    Call MD for:  persistant nausea and vomiting  Complete by: As directed    Call MD for:  severe uncontrolled pain   Complete by: As directed    Call MD for:  temperature >100.4   Complete by: As directed    Diet - low sodium heart healthy   Complete by: As directed    Diet Carb Modified   Complete by: As directed    Discharge instructions   Complete by: As directed    feeding supplement (BOOST / RESOURCE BREEZE) liquid 1 Container  Take 1 Container, Oral, 2 times daily between meals,   Increase activity slowly   Complete by: As directed         Medication List     STOP taking these medications    diclofenac sodium 1 % Gel Commonly known as: VOLTAREN   fluconazole 100 MG tablet Commonly known as: DIFLUCAN   lidocaine-prilocaine cream Commonly known as: EMLA   nicotine 14 mg/24hr patch Commonly known as: NICODERM CQ - dosed in mg/24 hours   triamcinolone 0.025 % ointment Commonly known  as: KENALOG       TAKE these medications    acetaminophen 500 MG tablet Commonly known as: TYLENOL Take 500 mg by mouth every 6 (six) hours as needed for headache.   Advair HFA 115-21 MCG/ACT inhaler Generic drug: fluticasone-salmeterol Inhale 2 puffs into the lungs 2 (two) times daily.   aerochamber plus with mask inhaler Use as instructed with inahaler.   albuterol 108 (90 Base) MCG/ACT inhaler Commonly known as: VENTOLIN HFA TAKE 2 PUFFS BY MOUTH EVERY 6 HOURS AS NEEDED FOR WHEEZE OR SHORTNESS OF BREATH What changed: See the new instructions.   atorvastatin 10 MG tablet Commonly known as: LIPITOR TAKE 1 TABLET BY MOUTH EVERY DAY   carvedilol 12.5 MG tablet Commonly known as: COREG Take 1 tablet (12.5 mg total) by mouth 2 (two) times daily with a meal.   doxycycline 100 MG tablet Commonly known as: VIBRA-TABS Take 1 tablet (100 mg total) by mouth 2 (two) times daily for 4 days.   feeding supplement Liqd Take 237 mLs by mouth daily. Start taking on: June 24, 2021   fenofibrate 48 MG tablet Commonly known as: TRICOR Take 48 mg by mouth daily.   guaiFENesin-dextromethorphan 100-10 MG/5ML syrup Commonly known as: ROBITUSSIN DM Take 5 mLs by mouth every 4 (four) hours as needed for cough.   IMODIUM PO Take by mouth as needed.   levothyroxine 112 MCG tablet Commonly known as: SYNTHROID Take 1 tablet (112 mcg total) by mouth daily before breakfast.   losartan 100 MG tablet Commonly known as: COZAAR TAKE 1 TABLET BY MOUTH EVERY DAY   multivitamin with minerals Tabs tablet Take 1 tablet by mouth daily.   nitroGLYCERIN 0.4 MG SL tablet Commonly known as: NITROSTAT Place 1 tablet (0.4 mg total) under the tongue every 5 (five) minutes x 3 doses as needed for chest pain (Max 3 doses within 15 min. Call 911).   OneTouch Verio test strip Generic drug: glucose blood Use to test 3-4 times daily.   pantoprazole 40 MG tablet Commonly known as: PROTONIX TAKE  1 TABLET BY MOUTH EVERY DAY   potassium chloride SA 20 MEQ tablet Commonly known as: Klor-Con M20 Take 1 tablet (20 mEq total) by mouth daily. What changed: See the new instructions.   prochlorperazine 10 MG tablet Commonly known as: COMPAZINE Take 1 tablet (10 mg total) by mouth every 6 (six) hours as needed for nausea or vomiting.   Tradjenta 5 MG  Tabs tablet Generic drug: linagliptin Take 1 tablet (5 mg total) by mouth daily.       Allergies  Allergen Reactions   Seasonal Ic [Cholestatin]     Itchy eye and runny nose      Procedures/Studies: DG Chest 2 View  Result Date: 06/21/2021 CLINICAL DATA:  Cough, history of lung cancer EXAM: CHEST - 2 VIEW COMPARISON:  03/18/2021 FINDINGS: Left chest port catheter. Cardiomegaly with right chest multi lead pacer defibrillator. No acute airspace opacity. Disc degenerative disease of the thoracic spine. IMPRESSION: Cardiomegaly without acute abnormality of the lungs. Electronically Signed   By: Eddie Candle M.D.   On: 06/21/2021 14:22   CT CHEST ABDOMEN PELVIS W CONTRAST  Result Date: 06/21/2021 CLINICAL DATA:  Metastatic small-cell lung cancer EXAM: CT CHEST, ABDOMEN, AND PELVIS WITH CONTRAST TECHNIQUE: Multidetector CT imaging of the chest, abdomen and pelvis was performed following the standard protocol during bolus administration of intravenous contrast. CONTRAST:  47mL OMNIPAQUE IOHEXOL 350 MG/ML SOLN COMPARISON:  03/14/2021 CT chest abdomen pelvis. FINDINGS: CT CHEST FINDINGS Cardiovascular: Coronary, aortic arch, and branch vessel atherosclerotic calcifications. Unchanged mild cardiomegaly. Right chest wall pacer with leads in the right atrial appendage and right ventricle. No pericardial effusion. Mediastinum/Nodes: The thyroid gland is not visualized. No enlarged mediastinal, hilar, or axillary lymph nodes. The trachea is midline. Lungs/Pleura: Advanced emphysema. No focal airspace consolidation, pleural effusion, or pneumothorax.  Redemonstrated medial right upper lobe nodule measures 15 x 14 mm (series 4, image 55), previously 15 x 15 mm when remeasured similarly, likely unchanged when accounting for slice selection. 8 mm triangular nodule in the left lower lobe (series 4, image 74), unchanged. Musculoskeletal: No acute osseous abnormality. CT ABDOMEN PELVIS FINDINGS Hepatobiliary: No focal liver abnormality is seen. Status post cholecystectomy. No biliary dilatation. Pancreas: Unremarkable. No pancreatic ductal dilatation or surrounding inflammatory changes. Spleen: Normal in size without focal abnormality. Adrenals/Urinary Tract: The adrenals are unremarkable. The kidneys enhance symmetrically with no hydronephrosis. Unchanged left mid kidney cyst. No nephrolithiasis. The bladder is unremarkable. Stomach/Bowel: The stomach is within normal limits. No bowel wall thickening or evidence of obstruction. Diverticulosis without evidence of diverticulitis. Vascular/Lymphatic: Aortic atherosclerosis. No enlarged abdominal or pelvic lymph nodes. Reproductive: Uterus and bilateral adnexa are unremarkable. Other: No free fluid or free air in the abdomen or pelvis. Musculoskeletal: No acute osseous abnormality. Right femoral nail. Unchanged sclerosis in the left iliac bone. IMPRESSION: 1. Stable exam. Unchanged appearance of right upper lobe and left lower lobe lung nodules. 2. No evidence of metastatic disease in the abdomen or pelvis. 3. No acute process in the chest, abdomen, or pelvis. 4. Aortic Atherosclerosis (ICD10-I70.0) and Emphysema (ICD10-J43.9). Electronically Signed   By: Merilyn Baba M.D.   On: 06/21/2021 19:35   ECHOCARDIOGRAM COMPLETE  Result Date: 06/22/2021    ECHOCARDIOGRAM REPORT   Patient Name:   [name] Date of Exam: 06/22/2021 Medical Rec #:  [id number]   Height:       62.0 in Accession #:    [id number]  Weight:       106.0 lb Date of Birth:  [birth year]   BSA:          1.460 m Patient Age:    86 years    BP:            126/72 mmHg Patient Gender: F           HR:           65 bpm. Exam Location:  Inpatient Procedure: 2D Echo, 3D Echo, Cardiac Doppler, Color Doppler, Strain Analysis and            Intracardiac Opacification Agent Indications:    R07.9* Chest pain, unspecified. Elevated troponin.  History:        Patient has prior history of Echocardiogram examinations, most                 recent 01/06/2020. Abnormal ECG and Pacemaker, Arrythmias:Atrial                 Fibrillation, Signs/Symptoms:Syncope; Risk Factors:Diabetes,                 Current Smoker and Dyslipidemia. Metastatic cancer.  Sonographer:    Roseanna Rainbow RDCS Referring Phys: Theola Sequin  Sonographer Comments: Global longitudinal strain was attempted. IMPRESSIONS  1. Global hypokinesis with akinesis of the distal anteroseptal wall, apex and distal inferior wall; overall severe LV dysfunction. LV function worse compared to previous.  2. Left ventricular ejection fraction, by estimation, is 25 to 30%. The left ventricle has severely decreased function. The left ventricle demonstrates global hypokinesis. Left ventricular diastolic parameters are indeterminate. The average left ventricular global longitudinal strain is -7.0 %. The global longitudinal strain is abnormal.  3. Right ventricular systolic function is normal. The right ventricular size is mildly enlarged. There is mildly elevated pulmonary artery systolic pressure.  4. Left atrial size was severely dilated.  5. Right atrial size was moderately dilated.  6. The mitral valve is normal in structure. Mild mitral valve regurgitation. No evidence of mitral stenosis.  7. Tricuspid valve regurgitation is moderate.  8. The aortic valve is tricuspid. Aortic valve regurgitation is moderate to severe. Mild aortic valve sclerosis is present, with no evidence of aortic valve stenosis.  9. The inferior vena cava is normal in size with greater than 50% respiratory variability, suggesting right atrial pressure of 3 mmHg.  FINDINGS  Left Ventricle: Left ventricular ejection fraction, by estimation, is 25 to 30%. The left ventricle has severely decreased function. The left ventricle demonstrates global hypokinesis. Definity contrast agent was given IV to delineate the left ventricular endocardial borders. The average left ventricular global longitudinal strain is -7.0 %. The global longitudinal strain is abnormal. The left ventricular internal cavity size was normal in size. There is no left ventricular hypertrophy. Left ventricular diastolic parameters are indeterminate. Right Ventricle: The right ventricular size is mildly enlarged. Right ventricular systolic function is normal. There is mildly elevated pulmonary artery systolic pressure. The tricuspid regurgitant velocity is 2.94 m/s, and with an assumed right atrial pressure of 3 mmHg, the estimated right ventricular systolic pressure is 03.7 mmHg. Left Atrium: Left atrial size was severely dilated. Right Atrium: Right atrial size was moderately dilated. Pericardium: There is no evidence of pericardial effusion. Mitral Valve: The mitral valve is normal in structure. Mild mitral valve regurgitation. No evidence of mitral valve stenosis. Tricuspid Valve: The tricuspid valve is normal in structure. Tricuspid valve regurgitation is moderate . No evidence of tricuspid stenosis. Aortic Valve: The aortic valve is tricuspid. Aortic valve regurgitation is moderate to severe. Aortic regurgitation PHT measures 376 msec. Mild aortic valve sclerosis is present, with no evidence of aortic valve stenosis. Aortic valve mean gradient measures 2.0 mmHg. Aortic valve peak gradient measures 4.9 mmHg. Pulmonic Valve: The pulmonic valve was normal in structure. Pulmonic valve regurgitation is trivial. No evidence of pulmonic stenosis. Aorta: The aortic root is normal in size and structure. Venous: The inferior vena cava  is normal in size with greater than 50% respiratory variability, suggesting right  atrial pressure of 3 mmHg. IAS/Shunts: No atrial level shunt detected by color flow Doppler. Additional Comments: Global hypokinesis with akinesis of the distal anteroseptal wall, apex and distal inferior wall; overall severe LV dysfunction. LV function worse compared to previous. A device lead is visualized.  LEFT VENTRICLE PLAX 2D LVIDd:         4.70 cm LVIDs:         3.90 cm     2D Longitudinal Strain LV PW:         1.10 cm     2D Strain GLS Avg:     -7.0 % LV IVS:        1.00 cm LVOT diam:     1.80 cm LV SV:         36 LV SV Index:   24          3D Volume EF: LVOT Area:     2.54 cm    3D EF:        36 %                            LV EDV:       108 ml                            LV ESV:       69 ml LV Volumes (MOD)           LV SV:        39 ml LV vol d, MOD A2C: 80.0 ml LV vol d, MOD A4C: 66.2 ml LV vol s, MOD A2C: 57.9 ml LV vol s, MOD A4C: 42.1 ml LV SV MOD A2C:     22.1 ml LV SV MOD A4C:     66.2 ml LV SV MOD BP:      24.8 ml RIGHT VENTRICLE            IVC RV S prime:     5.00 cm/s  IVC diam: 2.00 cm TAPSE (M-mode): 0.7 cm LEFT ATRIUM             Index       RIGHT ATRIUM           Index LA diam:        3.90 cm 2.67 cm/m  RA Area:     19.60 cm LA Vol (A2C):   92.0 ml 63.02 ml/m RA Volume:   58.10 ml  39.80 ml/m LA Vol (A4C):   60.5 ml 41.44 ml/m LA Biplane Vol: 79.8 ml 54.66 ml/m  AORTIC VALVE                   PULMONIC VALVE AV Area (Vmax):    1.59 cm    PR End Diast Vel: 1.62 msec AV Area (Vmean):   1.46 cm AV Area (VTI):     1.70 cm AV Vmax:           111.00 cm/s AV Vmean:          67.100 cm/s AV VTI:            0.209 m AV Peak Grad:      4.9 mmHg AV Mean Grad:      2.0 mmHg LVOT Vmax:         69.50 cm/s LVOT Vmean:  38.600 cm/s LVOT VTI:          0.140 m LVOT/AV VTI ratio: 0.67 AI PHT:            376 msec  AORTA Ao Root diam: 3.30 cm Ao Asc diam:  3.40 cm MITRAL VALVE               TRICUSPID VALVE MV Area (PHT): 4.83 cm    TR Peak grad:   34.6 mmHg MV Decel Time: 157 msec    TR Vmax:         294.00 cm/s MV E velocity: 65.95 cm/s                            SHUNTS                            Systemic VTI:  0.14 m                            Systemic Diam: 1.80 cm Kirk Ruths MD Electronically signed by Kirk Ruths MD Signature Date/Time: 06/22/2021/1:53:01 PM    Final       Subjective: Feels better.  Diarrhea improved.  3 BMs yesterday and one overnight and none when I saw her this morning.  This is at baseline as per discussion with daughter.  No pain.  Denies dyspnea.  Still has cough but less compared to yesterday.  Ambulated 100 feet with rolling walker in hallway with nursing tech.  Discharge Exam:  Vitals:   06/23/21 0828 06/23/21 0900 06/23/21 1218 06/23/21 1405  BP: (!) 127/56  (!) 114/46 109/74  Pulse: 65  70   Resp: 16 15 16  (!) 22  Temp: 98 F (36.7 C)  97.8 F (36.6 C)   TempSrc: Oral  Oral   SpO2: 99%  100%   Weight:      Height:        General exam: Elderly female, small built and frail, sitting up comfortably in bed without distress.  Respiratory system: Distant but clear to breath sounds bilaterally without wheezing, rhonchi or crackles.  No increased work of breathing. Cardiovascular system: S1 & S2 heard, RRR. No JVD, murmurs, rubs, gallops or clicks. No pedal edema.  Telemetry personally reviewed: A. fib with controlled ventricular rate and on demand V paced rhythm. Gastrointestinal system: Abdomen is nondistended, soft and nontender. No organomegaly or masses felt. Normal bowel sounds heard. Central nervous system: Alert and oriented. No focal neurological deficits. Extremities: Symmetric 5 x 5 power. Skin: No rashes, lesions or ulcers Psychiatry: Judgement and insight appear impaired.  Mood & affect appropriate.     The results of significant diagnostics from this hospitalization (including imaging, microbiology, ancillary and laboratory) are listed below for reference.     Microbiology: Recent Results (from the past 240 hour(s))  Resp Panel  by RT-PCR (Flu A&B, Covid) Nasopharyngeal Swab     Status: None   Collection Time: 06/21/21  2:24 PM   Specimen: Nasopharyngeal Swab; Nasopharyngeal(NP) swabs in vial transport medium  Result Value Ref Range Status   SARS Coronavirus 2 by RT PCR NEGATIVE NEGATIVE Final    Comment: (NOTE) SARS-CoV-2 target nucleic acids are NOT DETECTED.  The SARS-CoV-2 RNA is generally detectable in upper respiratory specimens during the acute phase of infection. The lowest concentration of SARS-CoV-2 viral copies this assay can detect is  138 copies/mL. A negative result does not preclude SARS-Cov-2 infection and should not be used as the sole basis for treatment or other patient management decisions. A negative result may occur with  improper specimen collection/handling, submission of specimen other than nasopharyngeal swab, presence of viral mutation(s) within the areas targeted by this assay, and inadequate number of viral copies(<138 copies/mL). A negative result must be combined with clinical observations, patient history, and epidemiological information. The expected result is Negative.  Fact Sheet for Patients:  EntrepreneurPulse.com.au  Fact Sheet for Healthcare Providers:  IncredibleEmployment.be  This test is no t yet approved or cleared by the [place] FDA and  has been authorized for detection and/or diagnosis of SARS-CoV-2 by FDA under an Emergency Use Authorization (EUA). This EUA will remain  in effect (meaning this test can be used) for the duration of the COVID-19 declaration under Section 564(b)(1) of the Act, 21 U.S.C.section 360bbb-3(b)(1), unless the authorization is terminated  or revoked sooner.       Influenza A by PCR NEGATIVE NEGATIVE Final   Influenza B by PCR NEGATIVE NEGATIVE Final    Comment: (NOTE) The Xpert Xpress SARS-CoV-2/FLU/RSV plus assay is intended as an aid in the diagnosis of influenza from Nasopharyngeal swab  specimens and should not be used as a sole basis for treatment. Nasal washings and aspirates are unacceptable for Xpert Xpress SARS-CoV-2/FLU/RSV testing.  Fact Sheet for Patients: EntrepreneurPulse.com.au  Fact Sheet for Healthcare Providers: IncredibleEmployment.be  This test is not yet approved or cleared by the [place] FDA and has been authorized for detection and/or diagnosis of SARS-CoV-2 by FDA under an Emergency Use Authorization (EUA). This EUA will remain in effect (meaning this test can be used) for the duration of the COVID-19 declaration under Section 564(b)(1) of the Act, 21 U.S.C. section 360bbb-3(b)(1), unless the authorization is terminated or revoked.  Performed at [facility], [place] [address] [place], [place] [address]      Labs: CBC: Recent Labs  Lab 06/21/21 1427 06/22/21 0546 06/23/21 0530  WBC 5.6 4.7 3.1*  NEUTROABS 3.7  --   --   HGB 10.7* 10.2* 10.0*  HCT 33.6* 33.5* 33.0*  MCV 97.1 98.5 98.8  PLT 139* 140* 142*    Basic Metabolic Panel: Recent Labs  Lab 06/21/21 1427 06/21/21 1947 06/22/21 0546 06/23/21 0530  NA 140  --  144 141  K 2.2* 2.8* 3.9 3.9  CL 103  --  114* 110  CO2 28  --  24 25  GLUCOSE 100*  --  67* 96  BUN 12  --  10 8  CREATININE 0.40*  --  0.32* 0.37*  CALCIUM 8.0*  --  8.0* 8.2*  MG 1.6*  --  2.1 1.8    Liver Function Tests: No results for input(s): AST, ALT, ALKPHOS, BILITOT, PROT, ALBUMIN in the last 168 hours.  CBG: Recent Labs  Lab 06/22/21 0750 06/22/21 1300 06/22/21 1645 06/23/21 0738 06/23/21 1123  GLUCAP 88 104* 89 106* 114*    Hgb A1c Recent Labs    06/22/21 0546  HGBA1C 6.8*    I discussed in detail with patient's daughter via phone, updated care and answered all questions.  Time coordinating discharge: 25 minutes  SIGNED:  Vernell Leep, MD, [place], [facility]. [facility]  To contact the attending provider between  7A-7P or the covering provider during after hours 7P-7A, please log into the web site [email or web address] and access using universal [place] password for that web site. If  you do not have the password, please call the hospital operator.

## 2021-06-23 NOTE — Progress Notes (Signed)
   06/23/21 1500  Mobility  Activity Ambulated in hall  Level of Assistance Contact guard assist, steadying assist  Assistive Device Front wheel walker  Distance Ambulated (ft) 100 ft  Mobility Ambulated with assistance in hallway  Mobility Response Tolerated well  Mobility performed by Mobility specialist  $Mobility charge 1 Mobility   Pt agreeable to mobilize today. Ambulated about 172ft with RW in hallway, tolerated well. NT and RN requested to bring the pt to the [facility] in the bathroom to prepare for bathing. Pt agreeable to plan. Left pt on [facility] with RN present. No other complaints.    [place] [facility] Acute [facility] Office: [phone]

## 2021-06-23 NOTE — Discharge Instructions (Signed)

## 2021-06-23 NOTE — Progress Notes (Addendum)
Progress Note  Patient Name: [name] Date of Encounter: 06/23/2021  Primary Cardiologist: [name], MD  Subjective   Stomach feels somewhat improved, only had 3 BM yesterday instead of going all day long. Still with rattly productive cough and DOE. No CP. Complains of dry mouth.  Inpatient Medications    Scheduled Meds:  carvedilol  12.5 mg Oral BID WC   Chlorhexidine Gluconate Cloth  6 each Topical Daily   doxycycline  100 mg Oral Q12H   fenofibrate  160 mg Oral Daily   insulin aspart  0-9 Units Subcutaneous TID WC   levothyroxine  112 mcg Oral QAC breakfast   losartan  100 mg Oral Daily   pantoprazole  40 mg Oral Daily   potassium chloride SA  20 mEq Oral Daily   Continuous Infusions:  PRN Meds: acetaminophen **OR** acetaminophen, albuterol, guaiFENesin-dextromethorphan, loperamide HCl, nitroGLYCERIN, sodium chloride flush   Vital Signs    Vitals:   06/22/21 1648 06/22/21 1834 06/22/21 2148 06/23/21 0459  BP: (!) 105/50 119/60 (!) 97/42 (!) 120/44  Pulse: 63  67 81  Resp: 16 (!) 24 19 18   Temp: 97.9 F (36.6 C)  97.9 F (36.6 C) 97.8 F (36.6 C)  TempSrc: Oral  Oral   SpO2: 99%  100% 100%  Weight:      Height:        Intake/Output Summary (Last 24 hours) at 06/23/2021 0729 Last data filed at 06/22/2021 0900 Gross per 24 hour  Intake 60 ml  Output 200 ml  Net -140 ml   Last 3 Weights 06/21/2021 06/21/2021 06/01/2021  Weight (lbs) 106 lb 0.7 oz 105 lb 13.1 oz 103 lb 14.4 oz  Weight (kg) 48.1 kg 48 kg 47.129 kg     Telemetry    Atrial fib, rate generally controlled, occasional PVC/one couplet - Personally Reviewed  Physical Exam   GEN: No acute distress. Very frail appearing. HEENT: Normocephalic, atraumatic, sclera non-icteric. Neck: No JVD or bruits. Cardiac: Irregularly irregular, rate controlled, no murmurs, rubs, or gallops.  Respiratory: Diffusely diminished throughout without wheezing, rales or rhonchi. Breathing is unlabored. GI:  Soft, nontender, non-distended, BS +x 4. MS: no deformity. Extremities: No clubbing or cyanosis. No edema. Distal pedal pulses are 2+ and equal bilaterally. Neuro:  AAOx3. Follows commands. Psych:  Responds to questions appropriately with a normal affect. In good spirits.  Labs    High Sensitivity Troponin:   Recent Labs  Lab 06/21/21 1427 06/21/21 1947 06/21/21 2220  TROPONINIHS 675* 916* 721*      Cardiac EnzymesNo results for input(s): TROPONINI in the last 168 hours. No results for input(s): TROPIPOC in the last 168 hours.   Chemistry Recent Labs  Lab 06/21/21 1427 06/21/21 1947 06/22/21 0546 06/23/21 0530  NA 140  --  144 141  K 2.2* 2.8* 3.9 3.9  CL 103  --  114* 110  CO2 28  --  24 25  GLUCOSE 100*  --  67* 96  BUN 12  --  10 8  CREATININE 0.40*  --  0.32* 0.37*  CALCIUM 8.0*  --  8.0* 8.2*  GFRNONAA >60  --  >60 >60  ANIONGAP 9  --  6 6     Hematology Recent Labs  Lab 06/21/21 1427 06/22/21 0546 06/23/21 0530  WBC 5.6 4.7 3.1*  RBC 3.46* 3.40* 3.34*  HGB 10.7* 10.2* 10.0*  HCT 33.6* 33.5* 33.0*  MCV 97.1 98.5 98.8  MCH 30.9 30.0 29.9  MCHC  31.8 30.4 30.3  RDW 13.8 13.9 13.8  PLT 139* 140* 142*    BNPNo results for input(s): BNP, PROBNP in the last 168 hours.   DDimer No results for input(s): DDIMER in the last 168 hours.   Radiology    DG Chest 2 View  Result Date: 06/21/2021 CLINICAL DATA:  Cough, history of lung cancer EXAM: CHEST - 2 VIEW COMPARISON:  03/18/2021 FINDINGS: Left chest port catheter. Cardiomegaly with right chest multi lead pacer defibrillator. No acute airspace opacity. Disc degenerative disease of the thoracic spine. IMPRESSION: Cardiomegaly without acute abnormality of the lungs. Electronically Signed   By: Eddie Candle M.D.   On: 06/21/2021 14:22   CT CHEST ABDOMEN PELVIS W CONTRAST  Result Date: 06/21/2021 CLINICAL DATA:  Metastatic small-cell lung cancer EXAM: CT CHEST, ABDOMEN, AND PELVIS WITH CONTRAST TECHNIQUE:  Multidetector CT imaging of the chest, abdomen and pelvis was performed following the standard protocol during bolus administration of intravenous contrast. CONTRAST:  76mL OMNIPAQUE IOHEXOL 350 MG/ML SOLN COMPARISON:  03/14/2021 CT chest abdomen pelvis. FINDINGS: CT CHEST FINDINGS Cardiovascular: Coronary, aortic arch, and branch vessel atherosclerotic calcifications. Unchanged mild cardiomegaly. Right chest wall pacer with leads in the right atrial appendage and right ventricle. No pericardial effusion. Mediastinum/Nodes: The thyroid gland is not visualized. No enlarged mediastinal, hilar, or axillary lymph nodes. The trachea is midline. Lungs/Pleura: Advanced emphysema. No focal airspace consolidation, pleural effusion, or pneumothorax. Redemonstrated medial right upper lobe nodule measures 15 x 14 mm (series 4, image 55), previously 15 x 15 mm when remeasured similarly, likely unchanged when accounting for slice selection. 8 mm triangular nodule in the left lower lobe (series 4, image 74), unchanged. Musculoskeletal: No acute osseous abnormality. CT ABDOMEN PELVIS FINDINGS Hepatobiliary: No focal liver abnormality is seen. Status post cholecystectomy. No biliary dilatation. Pancreas: Unremarkable. No pancreatic ductal dilatation or surrounding inflammatory changes. Spleen: Normal in size without focal abnormality. Adrenals/Urinary Tract: The adrenals are unremarkable. The kidneys enhance symmetrically with no hydronephrosis. Unchanged left mid kidney cyst. No nephrolithiasis. The bladder is unremarkable. Stomach/Bowel: The stomach is within normal limits. No bowel wall thickening or evidence of obstruction. Diverticulosis without evidence of diverticulitis. Vascular/Lymphatic: Aortic atherosclerosis. No enlarged abdominal or pelvic lymph nodes. Reproductive: Uterus and bilateral adnexa are unremarkable. Other: No free fluid or free air in the abdomen or pelvis. Musculoskeletal: No acute osseous abnormality.  Right femoral nail. Unchanged sclerosis in the left iliac bone. IMPRESSION: 1. Stable exam. Unchanged appearance of right upper lobe and left lower lobe lung nodules. 2. No evidence of metastatic disease in the abdomen or pelvis. 3. No acute process in the chest, abdomen, or pelvis. 4. Aortic Atherosclerosis (ICD10-I70.0) and Emphysema (ICD10-J43.9). Electronically Signed   By: Merilyn Baba M.D.   On: 06/21/2021 19:35   ECHOCARDIOGRAM COMPLETE  Result Date: 06/22/2021    ECHOCARDIOGRAM REPORT   Patient Name:   BRALYNN VELADOR Date of Exam: 06/22/2021 Medical Rec #:  [id number]   Height:       62.0 in Accession #:    [id number]  Weight:       106.0 lb Date of Birth:  [birth year]   BSA:          1.460 m Patient Age:    86 years    BP:           126/72 mmHg Patient Gender: F           HR:           65 bpm. Exam  Location:  Inpatient Procedure: 2D Echo, 3D Echo, Cardiac Doppler, Color Doppler, Strain Analysis and            Intracardiac Opacification Agent Indications:    R07.9* Chest pain, unspecified. Elevated troponin.  History:        Patient has prior history of Echocardiogram examinations, most                 recent 01/06/2020. Abnormal ECG and Pacemaker, Arrythmias:Atrial                 Fibrillation, Signs/Symptoms:Syncope; Risk Factors:Diabetes,                 Current Smoker and Dyslipidemia. Metastatic cancer.  Sonographer:    Roseanna Rainbow RDCS Referring Phys: Theola Sequin  Sonographer Comments: Global longitudinal strain was attempted. IMPRESSIONS  1. Global hypokinesis with akinesis of the distal anteroseptal wall, apex and distal inferior wall; overall severe LV dysfunction. LV function worse compared to previous.  2. Left ventricular ejection fraction, by estimation, is 25 to 30%. The left ventricle has severely decreased function. The left ventricle demonstrates global hypokinesis. Left ventricular diastolic parameters are indeterminate. The average left ventricular global longitudinal strain is -7.0 %.  The global longitudinal strain is abnormal.  3. Right ventricular systolic function is normal. The right ventricular size is mildly enlarged. There is mildly elevated pulmonary artery systolic pressure.  4. Left atrial size was severely dilated.  5. Right atrial size was moderately dilated.  6. The mitral valve is normal in structure. Mild mitral valve regurgitation. No evidence of mitral stenosis.  7. Tricuspid valve regurgitation is moderate.  8. The aortic valve is tricuspid. Aortic valve regurgitation is moderate to severe. Mild aortic valve sclerosis is present, with no evidence of aortic valve stenosis.  9. The inferior vena cava is normal in size with greater than 50% respiratory variability, suggesting right atrial pressure of 3 mmHg. FINDINGS  Left Ventricle: Left ventricular ejection fraction, by estimation, is 25 to 30%. The left ventricle has severely decreased function. The left ventricle demonstrates global hypokinesis. Definity contrast agent was given IV to delineate the left ventricular endocardial borders. The average left ventricular global longitudinal strain is -7.0 %. The global longitudinal strain is abnormal. The left ventricular internal cavity size was normal in size. There is no left ventricular hypertrophy. Left ventricular diastolic parameters are indeterminate. Right Ventricle: The right ventricular size is mildly enlarged. Right ventricular systolic function is normal. There is mildly elevated pulmonary artery systolic pressure. The tricuspid regurgitant velocity is 2.94 m/s, and with an assumed right atrial pressure of 3 mmHg, the estimated right ventricular systolic pressure is 30.1 mmHg. Left Atrium: Left atrial size was severely dilated. Right Atrium: Right atrial size was moderately dilated. Pericardium: There is no evidence of pericardial effusion. Mitral Valve: The mitral valve is normal in structure. Mild mitral valve regurgitation. No evidence of mitral valve stenosis.  Tricuspid Valve: The tricuspid valve is normal in structure. Tricuspid valve regurgitation is moderate . No evidence of tricuspid stenosis. Aortic Valve: The aortic valve is tricuspid. Aortic valve regurgitation is moderate to severe. Aortic regurgitation PHT measures 376 msec. Mild aortic valve sclerosis is present, with no evidence of aortic valve stenosis. Aortic valve mean gradient measures 2.0 mmHg. Aortic valve peak gradient measures 4.9 mmHg. Pulmonic Valve: The pulmonic valve was normal in structure. Pulmonic valve regurgitation is trivial. No evidence of pulmonic stenosis. Aorta: The aortic root is normal in size and structure. Venous: The  inferior vena cava is normal in size with greater than 50% respiratory variability, suggesting right atrial pressure of 3 mmHg. IAS/Shunts: No atrial level shunt detected by color flow Doppler. Additional Comments: Global hypokinesis with akinesis of the distal anteroseptal wall, apex and distal inferior wall; overall severe LV dysfunction. LV function worse compared to previous. A device lead is visualized.  LEFT VENTRICLE PLAX 2D LVIDd:         4.70 cm LVIDs:         3.90 cm     2D Longitudinal Strain LV PW:         1.10 cm     2D Strain GLS Avg:     -7.0 % LV IVS:        1.00 cm LVOT diam:     1.80 cm LV SV:         36 LV SV Index:   24          3D Volume EF: LVOT Area:     2.54 cm    3D EF:        36 %                            LV EDV:       108 ml                            LV ESV:       69 ml LV Volumes (MOD)           LV SV:        39 ml LV vol d, MOD A2C: 80.0 ml LV vol d, MOD A4C: 66.2 ml LV vol s, MOD A2C: 57.9 ml LV vol s, MOD A4C: 42.1 ml LV SV MOD A2C:     22.1 ml LV SV MOD A4C:     66.2 ml LV SV MOD BP:      24.8 ml RIGHT VENTRICLE            IVC RV S prime:     5.00 cm/s  IVC diam: 2.00 cm TAPSE (M-mode): 0.7 cm LEFT ATRIUM             Index       RIGHT ATRIUM           Index LA diam:        3.90 cm 2.67 cm/m  RA Area:     19.60 cm LA Vol (A2C):   92.0  ml 63.02 ml/m RA Volume:   58.10 ml  39.80 ml/m LA Vol (A4C):   60.5 ml 41.44 ml/m LA Biplane Vol: 79.8 ml 54.66 ml/m  AORTIC VALVE                   PULMONIC VALVE AV Area (Vmax):    1.59 cm    PR End Diast Vel: 1.62 msec AV Area (Vmean):   1.46 cm AV Area (VTI):     1.70 cm AV Vmax:           111.00 cm/s AV Vmean:          67.100 cm/s AV VTI:            0.209 m AV Peak Grad:      4.9 mmHg AV Mean Grad:      2.0 mmHg LVOT Vmax:         69.50  cm/s LVOT Vmean:        38.600 cm/s LVOT VTI:          0.140 m LVOT/AV VTI ratio: 0.67 AI PHT:            376 msec  AORTA Ao Root diam: 3.30 cm Ao Asc diam:  3.40 cm MITRAL VALVE               TRICUSPID VALVE MV Area (PHT): 4.83 cm    TR Peak grad:   34.6 mmHg MV Decel Time: 157 msec    TR Vmax:        294.00 cm/s MV E velocity: 65.95 cm/s                            SHUNTS                            Systemic VTI:  0.14 m                            Systemic Diam: 1.80 cm Kirk Ruths MD Electronically signed by Kirk Ruths MD Signature Date/Time: 06/22/2021/1:53:01 PM    Final     Cardiac Studies   2D echo 06/22/21  1. Global hypokinesis with akinesis of the distal anteroseptal wall, apex  and distal inferior wall; overall severe LV dysfunction. LV function worse  compared to previous.   2. Left ventricular ejection fraction, by estimation, is 25 to 30%. The  left ventricle has severely decreased function. The left ventricle  demonstrates global hypokinesis. Left ventricular diastolic parameters are  indeterminate. The average left  ventricular global longitudinal strain is -7.0 %. The global longitudinal  strain is abnormal.   3. Right ventricular systolic function is normal. The right ventricular  size is mildly enlarged. There is mildly elevated pulmonary artery  systolic pressure.   4. Left atrial size was severely dilated.   5. Right atrial size was moderately dilated.   6. The mitral valve is normal in structure. Mild mitral valve   regurgitation. No evidence of mitral stenosis.   7. Tricuspid valve regurgitation is moderate.   8. The aortic valve is tricuspid. Aortic valve regurgitation is moderate  to severe. Mild aortic valve sclerosis is present, with no evidence of  aortic valve stenosis.   9. The inferior vena cava is normal in size with greater than 50%  respiratory variability, suggesting right atrial pressure of 3 mmHg.   Patient Profile     86 y.o. 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 ED note mentioned Covid but SARS-CoV2 testing was negative on admission. She was found to have severe hypokalemia of 2.2, hypomagnesemia of 1.6, Hgb 10.7 (similar to prior), mild thrombocytopenia 139, elevated troponin to 778, and new systolic dysfunction by echocardiogram.  Assessment & Plan    1. SOB, cough, weakness, diarrhea - multiple medical issues as noted above, with severe hypokalemia and hypomagnesemia superimposed on metastatic lung CA -  also found to have new severe cardiomyopathy likely contributing  to picture of failure to thrive - Covid mentioned in EDP note but testing negative - further management per IM   2. Elevated troponin with known hx of CAD (peak 916) - suspect demand ischemia in the setting of increased metabolic demands, superimposed on underlying CAD - cannot fully exclude ACS but given constellation of symptoms and comorbidities including metastatic cancer with active brain mets, patient is not a candidate for invasive ischemic evaluation, and not a good candidate for additional  blood thinners at this time  - therefore would tailor treatment to symptomatic management/QOL rather than aggressive interventions which may do more harm than good - continue BB as prescribed - resume statin when OK with primary team  3. Acute systolic CHF with moderate-severe aortic insufficiency - 2D Echo 9/22/2 with severely decreased EF 25-30% with global hypokinesis with akinesis of the distal anteroseptal wall, apex  and distal inferior wall, mildly enlarged RV with mildly elevated PASP, severely dilated LA, moderately dilated RA, mild MR, moderate-severe AI - Dr. Gardiner Rhyme has raised question in our discussion yesterday whether this could be related to cardiotoxicity from immunotherapy agent given small incidence of myocarditis - I did relay this to K. Curcio with oncology who will review with Dr. Julien Nordmann on his return - plan medical management - continue carvedilol, losartan - BP is frequently too soft to consider additional titration of medications - will discuss potential diuretic plan with MD although patient presently appears on dry side with dry mucous membranes and increased skin turgor  4. Prolonged QT interval - suspect secondary to severe electrolyte abnormalities, potentially cardiomyopathy - primary team managing lytes, recommending keeping K 4.0 or greater and Mg 2.0 or greater - held loperamide/odansetron - will defer to IM on fluconazole rx listed yesterday - per [facility] was likely to have finished this, but had been resumed for a 1 time dose, held due to QT issues - EKG pending this AM - QTc still around 564ms on telemetry this AM - Addendum: EKG appears c/w prior, QTc still 555ms - avoid additional QT prolonging medications  5 Hypothyroidism - recent TSH 06/01/21 was normal   6. Permanent atrial fibrillation - rate is controlled on home carvedilol - CHADSVASC is 6 - anticoagulation picture was tricky to follow but per in-depth chart review, this was stopped earlier this  year after presence of brain mets were identified - oncology note indicated that blood thinner was on hold due to some bleeding noted on her CT of the head, and per Dr. Julien Nordmann will resume after she has completed radiation - will defer timing of resumption to oncology team   6. H/o PPM - continue usual EP f/u  [facility] will sign off.   Medication Recommendations: Losartan 100 mg daily, carvedilol 12.5 mg twice daily.  Would defer restarting anticoagulation to oncology given her brain mets Other recommendations (labs, testing, etc): None Follow up as an outpatient: Will schedule   For questions or updates, please contact [place] Please consult [email or web address] for contact info under Cardiology/STEMI.  Signed, Charlie Pitter, PA-C 06/23/2021, 7:29 AM    Patient seen and examined.  Agree with above documentation.  On exam, patient is alert and oriented, regular rate and rhythm, no murmurs, lungs CTAB, no LE edema or JVD.  Echocardiogram yesterday showed EF 25 to 30%, moderate to severe AI.  Differential diagnosis includes ischemic cardiomyopathy but would also be concerned about cardiotoxicity from chemotherapy, as her immunotherapy is associated with myocarditis.  Unfortunately she is not a  candidate for invasive evaluation given her metastatic cancer with brain mets.  Would recommend medical management.  Would continue losartan and carvedilol.  BP has been soft, would not titrate GDMT further at this time.  We will plan outpatient follow-up.  Agree with palliative care evaluation.  Donato Heinz, MD

## 2021-06-23 NOTE — Telephone Encounter (Signed)
[name] said she received the palliative care referral and has everything ready for when pt is D/C.

## 2021-06-23 NOTE — Progress Notes (Signed)
[place] [address] [facility]) Hospital Liaison note:  This is a pending outpatient-based Palliative Care patient. Will continue to follow for disposition.  Please call with any outpatient palliative questions or concerns.  Thank you, Lorelee Market, LPN [facility] Liaison [phone]

## 2021-06-23 NOTE — Plan of Care (Signed)

## 2021-06-23 NOTE — Progress Notes (Signed)
Initial Nutrition Assessment  DOCUMENTATION CODES:   Not applicable  INTERVENTION:  - will order Boost Breeze BID, each supplement provides 250 kcal and 9 grams of protein. - will order Ensure Plus once/day, each supplement provides 350 kcal and 13 grams of protein. - will order snacks small at 1000 and 1400 daily.    NUTRITION DIAGNOSIS:   Increased nutrient needs related to acute illness, cancer and cancer related treatments, chronic illness as evidenced by estimated needs.  GOAL:   Patient will meet greater than or equal to 90% of their needs  MONITOR:   PO intake, Supplement acceptance, Labs, Weight trends  REASON FOR ASSESSMENT:   Malnutrition Screening Tool, Consult Assessment of nutrition requirement/status  ASSESSMENT:   86 y.o. female with medical history of metastatic small cell lung cancer with pulmonary mets, bilateral hilar lymphadenopathy, bone mets, and brain mets s/p 18 cycles of chemotherapy (last: 06/01/21) and s/p whole brain XRT, COPD, DM, HTN, hypothyroidism, atrial fibrillation, and tachybradycardia syndrome s/p PPM. She presented to the ED due to generalized weakness, worsening cough, chills, occasional nausea, 1 episode of vomiting, and watery diarrhea x2 weeks. Daughter reported that patient has had diarrhea for >2 years.  Patient sleeping soundly at the time of visit and did not awake to name call x2. No visitors in the room. Able to talk with Tech who cared for patient yesterday and today. She reports that she has helped patient with ordering and has visualized her meals/meal completion. Yesterday patient ate 100% of breakfast and lunch. Lunch was chicken salad and soup. Patient ate 100% of breakfast this AM which was a banana, cream of wheat, and juice. Tech offered to order patient more food after she completed the meal but patient declined.   She was scheduled to be seen by a RD at the [facility] on 05/26/21 but was a no show for her appointment.    She has not been seen by a [place] RD at any time in the past.  Weight on 9/21 was 106 lb and weight has been stable over the past 6 months. No information documented in the edema section of flow sheet.   Patient has an outpatient Palliative Care consult pending.    Labs reviewed; CBGs: 106 mg/dl, creatinine: 0.37 mg/dl, Ca: 8.2 mg/dl. Medications reviewed; sliding scale novolog, 112 mcg oral synthroid/day, 40 mg oral protonix/day, 20 mEq Klor-Con/day.      NUTRITION - FOCUSED PHYSICAL EXAM:  Unable to complete at this time.  Diet Order:   Diet Order             Diet regular Room service appropriate? Yes; Fluid consistency: Thin  Diet effective now                   EDUCATION NEEDS:   No education needs have been identified at this time  Skin:  Skin Assessment: Reviewed RN Assessment  Last BM:  9/23 (type 7 x1)  Height:   Ht Readings from Last 1 Encounters:  06/21/21 5\' 2"  (1.575 m)    Weight:   Wt Readings from Last 1 Encounters:  06/21/21 48.1 kg     Estimated Nutritional Needs:  Kcal:  1650-1850 kcal Protein:  80-95 grams Fluid:  >/= 2 L/day      Jarome Matin, MS, RD, LDN, CNSC Inpatient Clinical Dietitian RD pager # available in [facility]  After hours/weekend pager # available in [facility]

## 2021-06-23 NOTE — Progress Notes (Signed)
F/u made per Dr. Newman Nickels request, notified nurse and they will re-print AVS to pull in appt.

## 2021-06-26 ENCOUNTER — Telehealth: Payer: Self-pay

## 2021-06-26 ENCOUNTER — Ambulatory Visit (HOSPITAL_COMMUNITY): Payer: Medicare Other

## 2021-06-26 ENCOUNTER — Other Ambulatory Visit: Payer: Self-pay

## 2021-06-26 DIAGNOSIS — J449 Chronic obstructive pulmonary disease, unspecified: Secondary | ICD-10-CM

## 2021-06-26 NOTE — Telephone Encounter (Signed)
Has appt 9/28

## 2021-06-26 NOTE — Telephone Encounter (Signed)
It is ok to try Albuterol neb treatments at home but we need to be cautious because it can aggravate atrial fib.She can try 1/2 dose tid prn. Thanks, BJ

## 2021-06-26 NOTE — Telephone Encounter (Signed)
Daughter of patient called to request a Rx prescription for a nebulizer because pt is not receiving medication properly with the inhaler and spacer she was prescribed

## 2021-06-26 NOTE — Telephone Encounter (Signed)
Transition Care Management Follow-up Telephone Call Date of discharge and from where: [name] 06/22/2021 How have you been since you were released from the hospital? Resting  Any questions or concerns? Yes  Items Reviewed: Did the pt receive and understand the discharge instructions provided? Yes  Medications obtained and verified? Yes  Other? No  Any new allergies since your discharge? No  Dietary orders reviewed? Yes Do you have support at home? Yes   Home Care and Equipment/Supplies: Were home health services ordered? no If so, what is the name of the agency? N/a  Has the agency set up a time to come to the patient's home? not applicable Were any new equipment or medical supplies ordered?  No What is the name of the medical supply agency? N/a Were you able to get the supplies/equipment? not applicable Do you have any questions related to the use of the equipment or supplies? No  Functional Questionnaire: (I = Independent and D = Dependent) ADLs: D  Bathing/Dressing- D  Meal Prep- D  Eating- D  Maintaining continence- D  Transferring/Ambulation- D  Managing Meds- D  Follow up appointments reviewed:  PCP Hospital f/u appt confirmed? Yes  Scheduled to see Dr.Jordan  on 06/28/2021 @ 1030. [facility] f/u appt confirmed? Yes  Scheduled to see Dr.Muhammad on 07/04/2021 @ 0800. Are transportation arrangements needed? No  If their condition worsens, is the pt aware to call PCP or go to the Emergency Dept.? Yes Was the patient provided with contact information for the PCP's office or ED? Yes Was to pt encouraged to call back with questions or concerns? Yes

## 2021-06-27 ENCOUNTER — Other Ambulatory Visit (HOSPITAL_COMMUNITY): Payer: Self-pay

## 2021-06-27 DIAGNOSIS — C3491 Malignant neoplasm of unspecified part of right bronchus or lung: Secondary | ICD-10-CM | POA: Diagnosis not present

## 2021-06-27 DIAGNOSIS — J449 Chronic obstructive pulmonary disease, unspecified: Secondary | ICD-10-CM | POA: Diagnosis not present

## 2021-06-27 MED ORDER — IPRATROPIUM-ALBUTEROL 0.5-2.5 (3) MG/3ML IN SOLN
1.5000 mL | Freq: Three times a day (TID) | RESPIRATORY_TRACT | 1 refills | Status: AC | PRN
  Filled 2021-06-27: qty 360, 30d supply, fill #0

## 2021-06-27 NOTE — Addendum Note (Signed)
Addended by: Rodrigo Ran on: 06/27/2021 09:52 AM   Modules accepted: Orders

## 2021-06-27 NOTE — Telephone Encounter (Signed)
I called and spoke with patient's daughter. She is aware of message below and that I will send the prescriptions in. I also changed patient's hospital follow up to a video visit, daughter will be there to help her with the visit.

## 2021-06-28 ENCOUNTER — Telehealth: Payer: Self-pay | Admitting: Internal Medicine

## 2021-06-28 ENCOUNTER — Telehealth (INDEPENDENT_AMBULATORY_CARE_PROVIDER_SITE_OTHER): Payer: Medicare Other | Admitting: Family Medicine

## 2021-06-28 ENCOUNTER — Encounter: Payer: Self-pay | Admitting: Internal Medicine

## 2021-06-28 ENCOUNTER — Other Ambulatory Visit (HOSPITAL_COMMUNITY): Payer: Self-pay

## 2021-06-28 ENCOUNTER — Encounter: Payer: Self-pay | Admitting: Family Medicine

## 2021-06-28 VITALS — Ht 62.0 in

## 2021-06-28 DIAGNOSIS — I119 Hypertensive heart disease without heart failure: Secondary | ICD-10-CM | POA: Diagnosis not present

## 2021-06-28 DIAGNOSIS — J42 Unspecified chronic bronchitis: Secondary | ICD-10-CM | POA: Diagnosis not present

## 2021-06-28 DIAGNOSIS — E876 Hypokalemia: Secondary | ICD-10-CM

## 2021-06-28 DIAGNOSIS — E1169 Type 2 diabetes mellitus with other specified complication: Secondary | ICD-10-CM | POA: Diagnosis not present

## 2021-06-28 DIAGNOSIS — K529 Noninfective gastroenteritis and colitis, unspecified: Secondary | ICD-10-CM

## 2021-06-28 DIAGNOSIS — E785 Hyperlipidemia, unspecified: Secondary | ICD-10-CM

## 2021-06-28 DIAGNOSIS — C7931 Secondary malignant neoplasm of brain: Secondary | ICD-10-CM

## 2021-06-28 MED ORDER — OXYCODONE-ACETAMINOPHEN 10-325 MG PO TABS
0.5000 | ORAL_TABLET | Freq: Two times a day (BID) | ORAL | 0 refills | Status: AC | PRN
Start: 2021-06-28 — End: ?
  Filled 2021-06-28: qty 14, 7d supply, fill #0

## 2021-06-28 NOTE — Progress Notes (Signed)
Virtual Visit via Video Note I connected with [name] on 06/28/21 by a video enabled telemedicine application and verified that I am speaking with the correct person using two identifiers.  Location patient: home Location provider:Home office Persons participating in the virtual visit: patient, daughter,provider  I discussed the limitations of evaluation and management by telemedicine and the availability of in person appointments. The patient expressed understanding and agreed to proceed.  Chief Complaint  Patient presents with   Hospitalization Follow-up   HPI: [name] is a 86 yo female with hx of metastatic small cell cancer with pulmonary/bone/ brain metastasis s/p 18 cycles of chemotherapy, last cycle on 06/01/2021, s/p whole brain irradiation completed on 03/29/2021, COPD, DM, HTN, hypothyroidism, PAF, tachybradycardia syndrome s/p pacemaker placement following on recent hospitalization. She was admitted on 06/21/21 and discharged home on 06/23/21.  TCM call on 06/26/21.  She was taken to the ED by her daughter because worsening generalized weakness,cough,and subjective fever as well as decreased oral intake. Hypokalemia and hypoMg++ caused by chronic diarrhea.She is on KLOR 20 meq daily. Mg++ She is on Imodium for chronic diarrhea. Upper abdominal pain and nausea.  Symptoms exacerbated by food intake. Elevated troponin, cardio consultation during hospitalization. Ischemic cardiomyopathy and possible chemo cardiotoxicity are to be considered. She is not having CP,palpitations,or worsening dyspnea.  Echo 06/22/21: LVEF 25-30%,moderate to severe aortic insufficiency. Atrial fib: Not on anticoagulation de to brain metastasis.  She is on Carvedilol 12.5 mg bid and Losartan 100 mg daily. She is not checking BP's.  Lab Results  Component Value Date   CREATININE 0.37 (L) 06/23/2021   BUN 8 06/23/2021   NA 141 06/23/2021   K 3.9 06/23/2021   CL 110 06/23/2021   CO2 25 06/23/2021    DM II: She is on Tradgenta 5 mg daily. She is not checking BS's.  Lab Results  Component Value Date   HGBA1C 6.8 (H) 06/22/2021   Lab Results  Component Value Date   WBC 3.1 (L) 06/23/2021   HGB 10.0 (L) 06/23/2021   HCT 33.0 (L) 06/23/2021   MCV 98.8 06/23/2021   PLT 142 (L) 06/23/2021   She has an appt with her oncologist tomorrow. For the past 2 weeks she has had more headaches, Tylenol was helping but frequently she is asking for something stronger. She took Percocet in the past. Negative for associated Robin changes or focal deficit.  Pending PT through [facility].  Her daughter would like have palliative care evaluation, states that referral was supposed to be placed but has not heard from [facility]. When I asked [name] if she wants to continue chemo she tells me "yes,if I needed." Decreased appetite. She is using a walker at home but in bed most of the time.  COPD: Completed abx treatment, Doxycycline. Productive cough, cannot cough sputum up. + Wheezing. On supplemental O2 3 LPM prn.  Duoneb sent to her pharmacy yesterday, her daughter is going to pick up neb machine. She is using Advair 115-21 mcg 2 puff bid. She is not using Albuterol inh.  She is on Atorvastatin 10 mg daily and Fenofibrate 48 mg daily. Lab Results  Component Value Date   CHOL 116 01/17/2021   HDL 63.70 01/17/2021   LDLCALC 43 01/17/2021   TRIG 49.0 01/17/2021   CHOLHDL 2 01/17/2021   ROS: See pertinent positives and negatives per HPI.  Past Medical History:  Diagnosis Date   Anemia    CAD (coronary artery disease), native coronary artery  PTCA of distal right coronary artery 1997 Cypher stents to circumflex 2005 Cardiac cath in 2011 with patent stent to circumflex and moderate disease elsewhere treated medically    Cardiac pacemaker in situ 12/24/2015   Original implant reportedly in 1991 for tachybradycardia syndrome, generator change in 1999, 2004 and evidently again in 2016 in [place]     COPD (chronic obstructive pulmonary disease) ([place])    Diabetes mellitus without complication ([place])    Hypertension    Hypothyroidism 03/23/2010   Lung cancer ([place])    Pacemaker    Permanent atrial fibrillation ([place]) 03/23/2010    Past Surgical History:  Procedure Laterality Date   BIOPSY  01/08/2020   Procedure: BIOPSY;  Surgeon: Garner Nash, DO;  Location: [place] [facility];  Service: Pulmonary;;   BRONCHIAL BRUSHINGS  01/08/2020   Procedure: BRONCHIAL BRUSHINGS;  Surgeon: Garner Nash, DO;  Location: [place] [facility];  Service: Pulmonary;;   BRONCHIAL WASHINGS  01/08/2020   Procedure: BRONCHIAL WASHINGS;  Surgeon: Garner Nash, DO;  Location: [place] [facility];  Service: Pulmonary;;   CARDIAC CATHETERIZATION N/A 12/26/2015   Procedure: Left Heart Cath and Coronary Angiography;  Surgeon: Peter M [name], MD;  Location: [place] [facility];  Service: Cardiovascular;  Laterality: N/A;   CARDIOVERSION  05/2015   CHOLECYSTECTOMY  2012   CORONARY ANGIOPLASTY WITH STENT PLACEMENT  1990   ENDOBRONCHIAL ULTRASOUND  01/08/2020   Procedure: ENDOBRONCHIAL ULTRASOUND;  Surgeon: Garner Nash, DO;  Location: [place] [facility];  Service: Pulmonary;;   FINE NEEDLE ASPIRATION BIOPSY  01/08/2020   Procedure: FINE NEEDLE ASPIRATION BIOPSY;  Surgeon: Garner Nash, DO;  Location: [place] [facility];  Service: Pulmonary;;   HERNIA REPAIR     IR IMAGING GUIDED PORT INSERTION  02/18/2020   PACEMAKER GENERATOR CHANGE  [id number], 2016   PACEMAKER INSERTION  1991   VIDEO BRONCHOSCOPY WITH ENDOBRONCHIAL ULTRASOUND N/A 01/08/2020   Procedure: VIDEO BRONCHOSCOPY;  Surgeon: Garner Nash, DO;  Location: [place];  Service: Pulmonary;  Laterality: N/A;    Family History  Problem Relation Age of Onset   Heart disease Father    Heart disease Mother    Heart attack Mother    Cirrhosis Brother    Colon cancer Neg Hx    Stomach cancer Neg Hx     Social History   Socioeconomic History   Marital status: Widowed     Spouse name: Not on file   Number of children: Not on file   Years of education: Not on file   Highest education level: Not on file  Occupational History   Occupation: retired  Tobacco Use   Smoking status: Some Days    Packs/day: 0.25    Years: 66.00    Pack years: 16.50    Types: Cigarettes    Last attempt to quit: 12/27/2019    Years since quitting: 1.5   Smokeless tobacco: Never   Tobacco comments:    03/16/21: Per pt, she recently began smoking 2-3 cigs a week but plans to stop again..  Vaping Use   Vaping Use: Never used  Substance and Sexual Activity   Alcohol use: No    Alcohol/week: 0.0 standard drinks   Drug use: No   Sexual activity: Not on file  Other Topics Concern   Not on file  Social History Narrative   Not on file   Social Determinants of Health   Financial Resource Strain: Low Risk    Difficulty of Paying Living Expenses: Not hard at  all  Food Insecurity: No Food Insecurity   Worried About Charity fundraiser in the Last Year: Never true   Ran Out of Food in the Last Year: Never true  Transportation Needs: No Transportation Needs   Lack of Transportation (Medical): No   Lack of Transportation (Non-Medical): No  Physical Activity: Inactive   Days of Exercise per Week: 0 days   Minutes of Exercise per Session: 0 min  Stress: No Stress Concern Present   Feeling of Stress : Not at all  Social Connections: Socially Isolated   Frequency of Communication with Friends and Family: More than three times a week   Frequency of Social Gatherings with Friends and Family: Never   Attends Religious Services: Never   Marine scientist or Organizations: No   Attends Archivist Meetings: Never   Marital Status: Widowed  Human resources officer Violence: Not At Risk   Fear of Current or Ex-Partner: No   Emotionally Abused: No   Physically Abused: No   Sexually Abused: No      Current Outpatient Medications:    acetaminophen (TYLENOL) 500 MG tablet,  Take 500 mg by mouth every 6 (six) hours as needed for headache., Disp: , Rfl:    albuterol (VENTOLIN HFA) 108 (90 Base) MCG/ACT inhaler, TAKE 2 PUFFS BY MOUTH EVERY 6 HOURS AS NEEDED FOR WHEEZE OR SHORTNESS OF BREATH (Patient taking differently: Inhale 2 puffs into the lungs every 6 (six) hours as needed for wheezing or shortness of breath.), Disp: 1 each, Rfl: 1   atorvastatin (LIPITOR) 10 MG tablet, TAKE 1 TABLET BY MOUTH EVERY DAY (Patient taking differently: Take 10 mg by mouth daily.), Disp: 30 tablet, Rfl: 0   carvedilol (COREG) 12.5 MG tablet, Take 1 tablet (12.5 mg total) by mouth 2 (two) times daily with a meal., Disp: 180 tablet, Rfl: 3   feeding supplement (ENSURE ENLIVE / ENSURE PLUS) LIQD, Take 237 mLs by mouth daily., Disp: 237 mL, Rfl: 12   fluticasone-salmeterol (ADVAIR HFA) 115-21 MCG/ACT inhaler, Inhale 2 puffs into the lungs 2 (two) times daily., Disp: 12 g, Rfl: 4   glucose blood (ONETOUCH VERIO) test strip, Use to test 3-4 times daily., Disp: 300 strip, Rfl: 4   guaiFENesin-dextromethorphan (ROBITUSSIN DM) 100-10 MG/5ML syrup, Take 5 mLs by mouth every 4 (four) hours as needed for cough., Disp: 118 mL, Rfl: 0   ipratropium-albuterol (DUONEB) 0.5-2.5 (3) MG/3ML SOLN, Take 1.5 mLs by nebulization 3 (three) times daily as needed., Disp: 360 mL, Rfl: 1   levothyroxine (SYNTHROID) 112 MCG tablet, Take 1 tablet (112 mcg total) by mouth daily before breakfast., Disp: 90 tablet, Rfl: 2   linagliptin (TRADJENTA) 5 MG TABS tablet, Take 1 tablet (5 mg total) by mouth daily., Disp: 90 tablet, Rfl: 1   Loperamide HCl (IMODIUM PO), Take by mouth as needed., Disp: , Rfl:    losartan (COZAAR) 100 MG tablet, TAKE 1 TABLET BY MOUTH EVERY DAY (Patient taking differently: Take 100 mg by mouth daily.), Disp: 90 tablet, Rfl: 2   Multiple Vitamin (MULTIVITAMIN WITH MINERALS) TABS tablet, Take 1 tablet by mouth daily., Disp: , Rfl:    nitroGLYCERIN (NITROSTAT) 0.4 MG SL tablet, Place 1 tablet (0.4 mg  total) under the tongue every 5 (five) minutes x 3 doses as needed for chest pain (Max 3 doses within 15 min. Call 911)., Disp: 10 tablet, Rfl: 0   oxyCODONE-acetaminophen (PERCOCET) 10-325 MG tablet, Take 0.5-1 tablets by mouth every 12 (twelve) hours as  needed for pain., Disp: 30 tablet, Rfl: 0   pantoprazole (PROTONIX) 40 MG tablet, TAKE 1 TABLET BY MOUTH EVERY DAY (Patient taking differently: Take 40 mg by mouth daily.), Disp: 90 tablet, Rfl: 3   potassium chloride SA (KLOR-CON M20) 20 MEQ tablet, Take 1 tablet (20 mEq total) by mouth daily., Disp: , Rfl:    prochlorperazine (COMPAZINE) 10 MG tablet, Take 1 tablet (10 mg total) by mouth every 6 (six) hours as needed for nausea or vomiting., Disp: 30 tablet, Rfl: 1   Spacer/Aero-Holding Chambers (AEROCHAMBER PLUS) inhaler, Use as instructed with inahaler., Disp: 1 each, Rfl: 1  EXAM:  VITALS per patient if applicable:Ht 5\' 2"  (1.575 m)   LMP  (LMP Unknown)   BMI 19.40 kg/m   GENERAL: alert, oriented in person and place, appears comfortable (in bed), and in no acute distress  HEENT: atraumatic, conjunctiva clear, no obvious abnormalities on inspection.  NECK: normal movements of the head and neck  LUNGS: on inspection no signs of respiratory distress, breathing rate appears normal, no obvious gross SOB, gasping or wheezing  CV: no obvious cyanosis  Robin: moves all visible extremities without noticeable abnormality  PSYCH/NEURO: pleasant and cooperative, no obvious depression or anxiety, speech and thought processing grossly intact  ASSESSMENT AND PLAN:  Discussed the following assessment and plan: Orders Placed This Encounter  Procedures   Basic metabolic panel   Ambulatory referral to [place]   Metastatic cancer to brain [facility]) - Plan: oxyCODONE-acetaminophen (PERCOCET) 10-325 MG tablet, Ambulatory referral to Home Health We discussed the difference between palliative and hospice care. Referral placed. She has an appt with  oncologic tomorrow and planning on having labs done.  Percocet side effects discussed, instructed to take it if tylenol does not help with headache or abdominal pain.Explained that it could aggravate nausea.  Type 2 diabetes mellitus with other specified complication, without long-term current use of insulin (HCC) Well controlled. No changes in [place] for now.  Hypertension with heart disease No changes in Carvedilol or Losartan dose. Monitor BP regularly, ideally BP >120/70's.  Chronic diarrhea Continue Imodium daily prn. Adequate hydration.  Hyperlipidemia associated with type 2 diabetes mellitus (HCC) Fenofibrate 48 mg to d/c. Continue Atorvastatin 10 mg daily for now.  Chronic bronchitis, unspecified chronic bronchitis type ([place]) Problem is not well controlled. Duoneb 1/2 treatment tid prn. Some side effects discussed. Continue Advair 115-21 mcg 2 puff bid, rinse mouth after use. Respiratory therapy through [facility].  Hypokalemia Continue KLOR 20 meq daily. BMP order placed to be done at her oncologist's office tomorrow.  I discussed the assessment and treatment plan with the patient. She and her daughter were provided an opportunity to ask questions and all were answered. They agreed with the plan and demonstrated an understanding of the instructions.  Return in about 3 months (around 09/27/2021).   Rita Prom [name], MD

## 2021-06-28 NOTE — Telephone Encounter (Signed)
Spoke with patient's daughter, Nelson Chimes, regarding the Palliative referral and she was in agreement with scheduling visit.  Palliative Consult was scheduled for 07/05/21 @ 8:30 AM with Dr. Hollace Kinnier and documentation will be noted in [facility]'s EMR in [place].

## 2021-06-29 ENCOUNTER — Encounter: Payer: Self-pay | Admitting: Internal Medicine

## 2021-06-29 ENCOUNTER — Inpatient Hospital Stay: Payer: Medicare Other

## 2021-06-29 ENCOUNTER — Telehealth: Payer: Self-pay | Admitting: Family Medicine

## 2021-06-29 ENCOUNTER — Inpatient Hospital Stay: Payer: Medicare Other | Admitting: Dietician

## 2021-06-29 ENCOUNTER — Other Ambulatory Visit: Payer: Self-pay

## 2021-06-29 ENCOUNTER — Inpatient Hospital Stay (HOSPITAL_BASED_OUTPATIENT_CLINIC_OR_DEPARTMENT_OTHER): Payer: Medicare Other | Admitting: Internal Medicine

## 2021-06-29 VITALS — BP 116/46 | HR 64 | Temp 97.9°F | Resp 19 | Ht 62.0 in | Wt 98.3 lb

## 2021-06-29 DIAGNOSIS — E039 Hypothyroidism, unspecified: Secondary | ICD-10-CM | POA: Diagnosis not present

## 2021-06-29 DIAGNOSIS — C7931 Secondary malignant neoplasm of brain: Secondary | ICD-10-CM

## 2021-06-29 DIAGNOSIS — Z5112 Encounter for antineoplastic immunotherapy: Secondary | ICD-10-CM

## 2021-06-29 DIAGNOSIS — E119 Type 2 diabetes mellitus without complications: Secondary | ICD-10-CM | POA: Diagnosis not present

## 2021-06-29 DIAGNOSIS — Z79899 Other long term (current) drug therapy: Secondary | ICD-10-CM | POA: Diagnosis not present

## 2021-06-29 DIAGNOSIS — I10 Essential (primary) hypertension: Secondary | ICD-10-CM | POA: Diagnosis not present

## 2021-06-29 DIAGNOSIS — C3491 Malignant neoplasm of unspecified part of right bronchus or lung: Secondary | ICD-10-CM | POA: Diagnosis not present

## 2021-06-29 DIAGNOSIS — Z95828 Presence of other vascular implants and grafts: Secondary | ICD-10-CM

## 2021-06-29 DIAGNOSIS — C3411 Malignant neoplasm of upper lobe, right bronchus or lung: Secondary | ICD-10-CM | POA: Diagnosis not present

## 2021-06-29 DIAGNOSIS — C7951 Secondary malignant neoplasm of bone: Secondary | ICD-10-CM | POA: Diagnosis not present

## 2021-06-29 DIAGNOSIS — I480 Paroxysmal atrial fibrillation: Secondary | ICD-10-CM | POA: Diagnosis not present

## 2021-06-29 LAB — CBC WITH DIFFERENTIAL (CANCER CENTER ONLY)
Abs Immature Granulocytes: 0.03 10*3/uL (ref 0.00–0.07)
Basophils Absolute: 0 10*3/uL (ref 0.0–0.1)
Basophils Relative: 0 %
Eosinophils Absolute: 0 10*3/uL (ref 0.0–0.5)
Eosinophils Relative: 1 %
HCT: 31.6 % — ABNORMAL LOW (ref 36.0–46.0)
Hemoglobin: 10 g/dL — ABNORMAL LOW (ref 12.0–15.0)
Immature Granulocytes: 1 %
Lymphocytes Relative: 19 %
Lymphs Abs: 1.1 10*3/uL (ref 0.7–4.0)
MCH: 30 pg (ref 26.0–34.0)
MCHC: 31.6 g/dL (ref 30.0–36.0)
MCV: 94.9 fL (ref 80.0–100.0)
Monocytes Absolute: 1 10*3/uL (ref 0.1–1.0)
Monocytes Relative: 17 %
Neutro Abs: 3.7 10*3/uL (ref 1.7–7.7)
Neutrophils Relative %: 62 %
Platelet Count: 212 10*3/uL (ref 150–400)
RBC: 3.33 MIL/uL — ABNORMAL LOW (ref 3.87–5.11)
RDW: 14 % (ref 11.5–15.5)
WBC Count: 5.8 10*3/uL (ref 4.0–10.5)
nRBC: 0 % (ref 0.0–0.2)

## 2021-06-29 LAB — CMP (CANCER CENTER ONLY)
ALT: 7 U/L (ref 0–44)
AST: 10 U/L — ABNORMAL LOW (ref 15–41)
Albumin: 2.6 g/dL — ABNORMAL LOW (ref 3.5–5.0)
Alkaline Phosphatase: 45 U/L (ref 38–126)
Anion gap: 9 (ref 5–15)
BUN: 11 mg/dL (ref 8–23)
CO2: 25 mmol/L (ref 22–32)
Calcium: 9 mg/dL (ref 8.9–10.3)
Chloride: 109 mmol/L (ref 98–111)
Creatinine: 0.65 mg/dL (ref 0.44–1.00)
GFR, Estimated: >60 mL/min (ref >60)
Glucose, Bld: 93 mg/dL (ref 70–99)
Potassium: 3.4 mmol/L — ABNORMAL LOW (ref 3.5–5.1)
Sodium: 143 mmol/L (ref 135–145)
Total Bilirubin: 0.7 mg/dL (ref 0.3–1.2)
Total Protein: 5.4 g/dL — ABNORMAL LOW (ref 6.5–8.1)

## 2021-06-29 LAB — TSH: TSH: 0.473 u[IU]/mL (ref 0.308–3.960)

## 2021-06-29 MED ORDER — SODIUM CHLORIDE 0.9% FLUSH
10.0000 mL | Freq: Once | INTRAVENOUS | Status: AC
  Administered 2021-06-29: 10 mL

## 2021-06-29 MED ORDER — SODIUM CHLORIDE 0.9 % IV SOLN
1500.0000 mg | Freq: Once | INTRAVENOUS | Status: AC
  Administered 2021-06-29: 1500 mg via INTRAVENOUS
  Filled 2021-06-29: qty 30

## 2021-06-29 MED ORDER — SODIUM CHLORIDE 0.9 % IV SOLN: Freq: Once | INTRAVENOUS | Status: AC

## 2021-06-29 NOTE — Patient Instructions (Signed)
[facility]COL[facility]   Discharge Instructions: Thank you for choosing [facility] to provide your oncology and hematology care.   If you have a lab appointment with the Cancer Center, please go directly to the Cancer Center and check in at the registration area.   Wear comfortable clothing and clothing appropriate for easy access to any Portacath or PICC line.   We strive to give you quality time with your provider. You may need to reschedule your appointment if you arrive late (15 or more minutes).  Arriving late affects you and other patients whose appointments are after yours.  Also, if you miss three or more appointments without notifying the office, you may be dismissed from the clinic at the provider's discretion.      For prescription refill requests, have your pharmacy contact our office and allow 72 hours for refills to be completed.    Today you received the following chemotherapy and/or immunotherapy agents: Durvalumab (Imfinzi).      To help prevent nausea and vomiting after your treatment, we encourage you to take your nausea medication as directed.  BELOW ARE SYMPTOMS THAT SHOULD BE REPORTED IMMEDIATELY: *FEVER GREATER THAN 100.4 F (38 C) OR HIGHER *CHILLS OR SWEATING *NAUSEA AND VOMITING THAT IS NOT CONTROLLED WITH YOUR NAUSEA MEDICATION *UNUSUAL SHORTNESS OF BREATH *UNUSUAL BRUISING OR BLEEDING *URINARY PROBLEMS (pain or burning when urinating, or frequent urination) *BOWEL PROBLEMS (unusual diarrhea, constipation, pain near the anus) TENDERNESS IN MOUTH AND THROAT WITH OR WITHOUT PRESENCE OF ULCERS (sore throat, sores in mouth, or a toothache) UNUSUAL RASH, SWELLING OR PAIN  UNUSUAL VAGINAL DISCHARGE OR ITCHING   Items with * indicate a potential emergency and should be followed up as soon as possible or go to the Emergency Department if any problems should occur.  Please show the CHEMOTHERAPY ALERT CARD or IMMUNOTHERAPY ALERT CARD  at check-in to the Emergency Department and triage nurse.  Should you have questions after your visit or need to cancel or reschedule your appointment, please contact [facility]  Dept: [phone]  and follow the prompts.  Office hours are 8:00 a.m. to 4:30 p.m. Monday - Friday. Please note that voicemails left after 4:00 p.m. may not be returned until the following business day.  We are closed weekends and major holidays. You have access to a nurse at all times for urgent questions. Please call the main number to the clinic Dept: [phone] and follow the prompts.   For any non-urgent questions, you may also contact your provider using MyChart. We now offer e-Visits for anyone 18 and older to request care online for non-urgent symptoms. For details visit [email or web address].   Also download the MyChart app! Go to the app store, search "MyChart", open the app, select , and log in with your MyChart username and password.  Due to Covid, a mask is required upon entering the hospital/clinic. If you do not have a mask, one will be given to you upon arrival. For doctor visits, patients may have 1 support person aged 18 or older with them. For treatment visits, patients cannot have anyone with them due to current Covid guidelines and our immunocompromised population.   

## 2021-06-29 NOTE — Progress Notes (Signed)
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 She completed whole brain radiation 6/29. Patient is currently receiving systemic chemotherapy with carboplatin with Neulasta support followed by maintenance Imfinzi after cycle 4. She is s/p 18 cycles.  Recent hospital admission 9/21-9/23 for shortness of breath and abdominal pain. She has been referred to outpatient palliative care team   Met with patient during infusion, she reports she does not like when it gets cold outside. She is under layers of blankets, politely declines need for additional blanket. Patient reports her appetite has not been good, but knows she needs to eat. She recalls feeling full after eating half sandwich ([place] or hot sub). Patient waits a few hours and eats more. Patient likes peanut butter, sometimes she has a few crackers. She is drinking 1-2 Boost Plus (does not like vanilla flavor) daily, reports this helps her go to the bathroom. Patient reports chronic constipation, her last BM was this morning. She denies nausea, vomiting. Patient reports abdominal pain if she drinks juice or boost that is right out of the fridge. She will let it sit out on the counter for a while before drinking.   Medications: Oxycodone, Klor-con, MVI, Tradjenta, Protonix, Imodium, Compazine  Labs: K 3.4  Anthropometrics: Weight 98 lb 4.8 oz today decreased 7.5% (8 lbs) in 7 days. This is significant.   9/21 - 106 lb 0.7 oz 9/1 - 103 lb 14.4 oz   NUTRITION DIAGNOSIS: Unintended weight loss ongoing   MALNUTRITION DIAGNOSIS: Highly suspect patient meets criteria for degree of malnutrition given significant weight loss, however patient is under multiple blankets and wearing a sweatshirt in infusion today, unable to complete nutrition focused physical exam to assess for fat/muscle depletions   INTERVENTION:  Encouraged patient to have small frequent meals and snacks that are high in calories and  protein - handout with snack ideas and warm supplement recipes given Encouraged drinking 2-3 Boost Plus/equivalent daily as tolerated - coupons provided Patient given strawberry Boost Plus to drink during infusion today Contact information provided    MONITORING, EVALUATION, GOAL: weight trends, intake   NEXT VISIT: Thursday, October 27 during infusion

## 2021-06-29 NOTE — Telephone Encounter (Deleted)
This telephone encounter documentation was created in error.  Elenor Quinones, PharmD, BCPS, BCIDP Clinical Pharmacist 06/29/2021 12:16 PM

## 2021-06-29 NOTE — Telephone Encounter (Signed)
This telephone encounter documentation was created in error.

## 2021-06-29 NOTE — Progress Notes (Signed)
[name] Telephone:([phone]) [phone]   Fax:(336) [phone]  OFFICE PROGRESS NOTE  [name], [name], MD [address] [place] [address]  DIAGNOSIS: Extensive stage (T2 a, N2, M1 C) small cell lung cancer presented with central right upper lobe lung mass in addition to left lower lobe pulmonary metastasis and bilateral hilar, subcarinal and bilateral paratracheal and left prevascular lymphadenopathy in addition to bone metastasis in the left iliac wing diagnosed in April 2021.  PRIOR THERAPY: Whole brain irradiation under the care of Dr. Isidore Moos completed on 03/29/2021.  CURRENT THERAPY: Systemic chemotherapy with carboplatin for AUC of 5 on day 1, etoposide 100 mg/M2 on days 1, 2 and 3 with Neulasta support in addition to Imfinzi 1500 mg IV every 3 weeks during chemotherapy followed by maintenance Imfinzi 1500 mg IV every 4 weeks after cycle #4.  Status post 18 cycles.  INTERVAL HISTORY: [name] 86 y.o. female returns to the clinic today for follow-up visit accompanied by her daughter.  The patient is feeling fine today with no concerning complaints except for fatigue and cough productive of thick mucus.  She was admitted to the hospital recently for shortness of breath as well as abdominal pain.  She had CT scan of the chest performed during her hospitalization that showed no concerning findings for disease progression or pneumonia.  The patient is feeling much better and ready to resume her treatment.  She had a referral to the palliative care team on outpatient basis.  She denied having any nausea, vomiting, diarrhea or constipation.  She has no headache or visual changes.  She has no recent weight loss or night sweats.  She is here today for evaluation before starting cycle #19 of her treatment.    MEDICAL HISTORY: Past Medical History:  Diagnosis Date   Anemia    CAD (coronary artery disease), native coronary artery    PTCA of distal right coronary artery  1997 Cypher stents to circumflex 2005 Cardiac cath in 2011 with patent stent to circumflex and moderate disease elsewhere treated medically    Cardiac pacemaker in situ 12/24/2015   Original implant reportedly in 1991 for tachybradycardia syndrome, generator change in 1999, 2004 and evidently again in 2016 in [place]    COPD (chronic obstructive pulmonary disease) ([place])    Diabetes mellitus without complication ([place])    Hypertension    Hypothyroidism 03/23/2010   Lung cancer [facility])    Pacemaker    Permanent atrial fibrillation ([place]) 03/23/2010    ALLERGIES:  is allergic to seasonal ic [cholestatin].  MEDICATIONS:  Current Outpatient Medications  Medication Sig Dispense Refill   acetaminophen (TYLENOL) 500 MG tablet Take 500 mg by mouth every 6 (six) hours as needed for headache.     albuterol (VENTOLIN HFA) 108 (90 Base) MCG/ACT inhaler TAKE 2 PUFFS BY MOUTH EVERY 6 HOURS AS NEEDED FOR WHEEZE OR SHORTNESS OF BREATH (Patient taking differently: Inhale 2 puffs into the lungs every 6 (six) hours as needed for wheezing or shortness of breath.) 1 each 1   atorvastatin (LIPITOR) 10 MG tablet TAKE 1 TABLET BY MOUTH EVERY DAY (Patient taking differently: Take 10 mg by mouth daily.) 30 tablet 0   carvedilol (COREG) 12.5 MG tablet Take 1 tablet (12.5 mg total) by mouth 2 (two) times daily with a meal. 180 tablet 3   feeding supplement (ENSURE ENLIVE / ENSURE PLUS) LIQD Take 237 mLs by mouth daily. 237 mL 12   fluticasone-salmeterol (ADVAIR HFA)  115-21 MCG/ACT inhaler Inhale 2 puffs into the lungs 2 (two) times daily. 12 g 4   glucose blood (ONETOUCH VERIO) test strip Use to test 3-4 times daily. 300 strip 4   guaiFENesin-dextromethorphan (ROBITUSSIN DM) 100-10 MG/5ML syrup Take 5 mLs by mouth every 4 (four) hours as needed for cough. 118 mL 0   ipratropium-albuterol (DUONEB) 0.5-2.5 (3) MG/3ML SOLN Take 1.5 mLs by nebulization 3 (three) times daily as needed. 360 mL 1   levothyroxine (SYNTHROID)  112 MCG tablet Take 1 tablet (112 mcg total) by mouth daily before breakfast. 90 tablet 2   linagliptin (TRADJENTA) 5 MG TABS tablet Take 1 tablet (5 mg total) by mouth daily. 90 tablet 1   Loperamide HCl (IMODIUM PO) Take by mouth as needed.     losartan (COZAAR) 100 MG tablet TAKE 1 TABLET BY MOUTH EVERY DAY (Patient taking differently: Take 100 mg by mouth daily.) 90 tablet 2   Multiple Vitamin (MULTIVITAMIN WITH MINERALS) TABS tablet Take 1 tablet by mouth daily.     nitroGLYCERIN (NITROSTAT) 0.4 MG SL tablet Place 1 tablet (0.4 mg total) under the tongue every 5 (five) minutes x 3 doses as needed for chest pain (Max 3 doses within 15 min. Call 911). 10 tablet 0   oxyCODONE-acetaminophen (PERCOCET) 10-325 MG tablet Take 0.5-1 tablets by mouth every 12 (twelve) hours as needed for pain. 30 tablet 0   pantoprazole (PROTONIX) 40 MG tablet TAKE 1 TABLET BY MOUTH EVERY DAY (Patient taking differently: Take 40 mg by mouth daily.) 90 tablet 3   potassium chloride SA (KLOR-CON M20) 20 MEQ tablet Take 1 tablet (20 mEq total) by mouth daily.     prochlorperazine (COMPAZINE) 10 MG tablet Take 1 tablet (10 mg total) by mouth every 6 (six) hours as needed for nausea or vomiting. 30 tablet 1   Spacer/Aero-Holding Chambers (AEROCHAMBER PLUS) inhaler Use as instructed with inahaler. 1 each 1   No current facility-administered medications for this visit.    SURGICAL HISTORY:  Past Surgical History:  Procedure Laterality Date   BIOPSY  01/08/2020   Procedure: BIOPSY;  Surgeon: Garner Nash, DO;  Location: [place] [facility];  Service: Pulmonary;;   BRONCHIAL BRUSHINGS  01/08/2020   Procedure: BRONCHIAL BRUSHINGS;  Surgeon: Garner Nash, DO;  Location: [place] [facility];  Service: Pulmonary;;   BRONCHIAL WASHINGS  01/08/2020   Procedure: BRONCHIAL WASHINGS;  Surgeon: Garner Nash, DO;  Location: [place] [facility];  Service: Pulmonary;;   CARDIAC CATHETERIZATION N/A 12/26/2015   Procedure: Left Heart Cath and Coronary  Angiography;  Surgeon: Peter M [name], MD;  Location: [place];  Service: Cardiovascular;  Laterality: N/A;   CARDIOVERSION  05/2015   CHOLECYSTECTOMY  2012   CORONARY ANGIOPLASTY WITH STENT PLACEMENT  1990   ENDOBRONCHIAL ULTRASOUND  01/08/2020   Procedure: ENDOBRONCHIAL ULTRASOUND;  Surgeon: Garner Nash, DO;  Location: [place] [facility];  Service: Pulmonary;;   FINE NEEDLE ASPIRATION BIOPSY  01/08/2020   Procedure: FINE NEEDLE ASPIRATION BIOPSY;  Surgeon: Garner Nash, DO;  Location: [place] [facility];  Service: Pulmonary;;   HERNIA REPAIR     IR IMAGING GUIDED PORT INSERTION  02/18/2020   PACEMAKER GENERATOR CHANGE  [id number], 2016   PACEMAKER INSERTION  1991   VIDEO BRONCHOSCOPY WITH ENDOBRONCHIAL ULTRASOUND N/A 01/08/2020   Procedure: VIDEO BRONCHOSCOPY;  Surgeon: Garner Nash, DO;  Location: [place];  Service: Pulmonary;  Laterality: N/A;    REVIEW OF SYSTEMS:  Constitutional: positive for fatigue Eyes: negative Ears,  nose, mouth, throat, and face: negative Respiratory: positive for cough Cardiovascular: negative Gastrointestinal: positive for abdominal pain and dyspepsia Genitourinary:negative Integument/breast: negative Hematologic/lymphatic: negative Musculoskeletal:negative Neurological: negative Behavioral/Psych: negative Endocrine: negative Allergic/Immunologic: negative   PHYSICAL EXAMINATION: General appearance: alert, cooperative, appears stated age, fatigued, and no distress Head: Normocephalic, without obvious abnormality, atraumatic Neck: no adenopathy, no JVD, supple, symmetrical, trachea midline, and thyroid not enlarged, symmetric, no tenderness/mass/nodules Lymph nodes: Cervical, supraclavicular, and axillary nodes normal. Resp: clear to auscultation bilaterally Back: symmetric, no curvature. ROM normal. No CVA tenderness. Cardio: regular rate and rhythm, S1, S2 normal, no murmur, click, rub or gallop GI: soft, non-tender; bowel sounds normal; no  masses,  no organomegaly Extremities: extremities normal, atraumatic, no cyanosis or edema Neurologic: Alert and oriented X 3, normal strength and tone. Normal symmetric reflexes. Normal coordination and gait  ECOG PERFORMANCE STATUS: 1 - Symptomatic but completely ambulatory  Blood pressure (!) 116/46, pulse 64, temperature 97.9 F (36.6 C), temperature source Oral, resp. rate 19, height 5\' 2"  (1.575 m), weight 98 lb 4.8 oz (44.6 kg), SpO2 98 %.  LABORATORY DATA: Lab Results  Component Value Date   WBC 5.8 06/29/2021   HGB 10.0 (L) 06/29/2021   HCT 31.6 (L) 06/29/2021   MCV 94.9 06/29/2021   PLT 212 06/29/2021      Chemistry      Component Value Date/Time   NA 141 06/23/2021 0530   NA 143 03/17/2020 0915   K 3.9 06/23/2021 0530   CL 110 06/23/2021 0530   CO2 25 06/23/2021 0530   BUN 8 06/23/2021 0530   BUN 6 (L) 03/17/2020 0915   CREATININE 0.37 (L) 06/23/2021 0530   CREATININE 0.56 06/01/2021 0901      Component Value Date/Time   CALCIUM 8.2 (L) 06/23/2021 0530   ALKPHOS 43 06/01/2021 0901   AST 12 (L) 06/01/2021 0901   ALT 7 06/01/2021 0901   BILITOT 0.6 06/01/2021 0901       RADIOGRAPHIC STUDIES: DG Chest 2 View  Result Date: 06/21/2021 CLINICAL DATA:  Cough, history of lung cancer EXAM: CHEST - 2 VIEW COMPARISON:  03/18/2021 FINDINGS: Left chest port catheter. Cardiomegaly with right chest multi lead pacer defibrillator. No acute airspace opacity. Disc degenerative disease of the thoracic spine. IMPRESSION: Cardiomegaly without acute abnormality of the lungs. Electronically Signed   By: Eddie Candle M.D.   On: 06/21/2021 14:22   CT CHEST ABDOMEN PELVIS W CONTRAST  Result Date: 06/21/2021 CLINICAL DATA:  Metastatic small-cell lung cancer EXAM: CT CHEST, ABDOMEN, AND PELVIS WITH CONTRAST TECHNIQUE: Multidetector CT imaging of the chest, abdomen and pelvis was performed following the standard protocol during bolus administration of intravenous contrast. CONTRAST:   54mL OMNIPAQUE IOHEXOL 350 MG/ML SOLN COMPARISON:  03/14/2021 CT chest abdomen pelvis. FINDINGS: CT CHEST FINDINGS Cardiovascular: Coronary, aortic arch, and branch vessel atherosclerotic calcifications. Unchanged mild cardiomegaly. Right chest wall pacer with leads in the right atrial appendage and right ventricle. No pericardial effusion. Mediastinum/Nodes: The thyroid gland is not visualized. No enlarged mediastinal, hilar, or axillary lymph nodes. The trachea is midline. Lungs/Pleura: Advanced emphysema. No focal airspace consolidation, pleural effusion, or pneumothorax. Redemonstrated medial right upper lobe nodule measures 15 x 14 mm (series 4, image 55), previously 15 x 15 mm when remeasured similarly, likely unchanged when accounting for slice selection. 8 mm triangular nodule in the left lower lobe (series 4, image 74), unchanged. Musculoskeletal: No acute osseous abnormality. CT ABDOMEN PELVIS FINDINGS Hepatobiliary: No focal liver abnormality is seen. Status post  cholecystectomy. No biliary dilatation. Pancreas: Unremarkable. No pancreatic ductal dilatation or surrounding inflammatory changes. Spleen: Normal in size without focal abnormality. Adrenals/Urinary Tract: The adrenals are unremarkable. The kidneys enhance symmetrically with no hydronephrosis. Unchanged left mid kidney cyst. No nephrolithiasis. The bladder is unremarkable. Stomach/Bowel: The stomach is within normal limits. No bowel wall thickening or evidence of obstruction. Diverticulosis without evidence of diverticulitis. Vascular/Lymphatic: Aortic atherosclerosis. No enlarged abdominal or pelvic lymph nodes. Reproductive: Uterus and bilateral adnexa are unremarkable. Other: No free fluid or free air in the abdomen or pelvis. Musculoskeletal: No acute osseous abnormality. Right femoral nail. Unchanged sclerosis in the left iliac bone. IMPRESSION: 1. Stable exam. Unchanged appearance of right upper lobe and left lower lobe lung nodules. 2. No  evidence of metastatic disease in the abdomen or pelvis. 3. No acute process in the chest, abdomen, or pelvis. 4. Aortic Atherosclerosis (ICD10-I70.0) and Emphysema (ICD10-J43.9). Electronically Signed   By: Merilyn Baba M.D.   On: 06/21/2021 19:35   ECHOCARDIOGRAM COMPLETE  Result Date: 06/22/2021    ECHOCARDIOGRAM REPORT   Patient Name:   [name] Date of Exam: 06/22/2021 Medical Rec #:  [id number]   Height:       62.0 in Accession #:    [id number]  Weight:       106.0 lb Date of Birth:  [birth year]   BSA:          1.460 m Patient Age:    24 years    BP:           126/72 mmHg Patient Gender: F           HR:           65 bpm. Exam Location:  Inpatient Procedure: 2D Echo, 3D Echo, Cardiac Doppler, Color Doppler, Strain Analysis and            Intracardiac Opacification Agent Indications:    R07.9* Chest pain, unspecified. Elevated troponin.  History:        Patient has prior history of Echocardiogram examinations, most                 recent 01/06/2020. Abnormal ECG and Pacemaker, Arrythmias:Atrial                 Fibrillation, Signs/Symptoms:Syncope; Risk Factors:Diabetes,                 Current Smoker and Dyslipidemia. Metastatic cancer.  Sonographer:    Roseanna Rainbow RDCS Referring Phys: Theola Sequin  Sonographer Comments: Global longitudinal strain was attempted. IMPRESSIONS  1. Global hypokinesis with akinesis of the distal anteroseptal wall, apex and distal inferior wall; overall severe LV dysfunction. LV function worse compared to previous.  2. Left ventricular ejection fraction, by estimation, is 25 to 30%. The left ventricle has severely decreased function. The left ventricle demonstrates global hypokinesis. Left ventricular diastolic parameters are indeterminate. The average left ventricular global longitudinal strain is -7.0 %. The global longitudinal strain is abnormal.  3. Right ventricular systolic function is normal. The right ventricular size is mildly enlarged. There is mildly elevated  pulmonary artery systolic pressure.  4. Left atrial size was severely dilated.  5. Right atrial size was moderately dilated.  6. The mitral valve is normal in structure. Mild mitral valve regurgitation. No evidence of mitral stenosis.  7. Tricuspid valve regurgitation is moderate.  8. The aortic valve is tricuspid. Aortic valve regurgitation is moderate to severe. Mild aortic valve sclerosis is present, with no evidence of aortic  valve stenosis.  9. The inferior vena cava is normal in size with greater than 50% respiratory variability, suggesting right atrial pressure of 3 mmHg. FINDINGS  Left Ventricle: Left ventricular ejection fraction, by estimation, is 25 to 30%. The left ventricle has severely decreased function. The left ventricle demonstrates global hypokinesis. Definity contrast agent was given IV to delineate the left ventricular endocardial borders. The average left ventricular global longitudinal strain is -7.0 %. The global longitudinal strain is abnormal. The left ventricular internal cavity size was normal in size. There is no left ventricular hypertrophy. Left ventricular diastolic parameters are indeterminate. Right Ventricle: The right ventricular size is mildly enlarged. Right ventricular systolic function is normal. There is mildly elevated pulmonary artery systolic pressure. The tricuspid regurgitant velocity is 2.94 m/s, and with an assumed right atrial pressure of 3 mmHg, the estimated right ventricular systolic pressure is 10.1 mmHg. Left Atrium: Left atrial size was severely dilated. Right Atrium: Right atrial size was moderately dilated. Pericardium: There is no evidence of pericardial effusion. Mitral Valve: The mitral valve is normal in structure. Mild mitral valve regurgitation. No evidence of mitral valve stenosis. Tricuspid Valve: The tricuspid valve is normal in structure. Tricuspid valve regurgitation is moderate . No evidence of tricuspid stenosis. Aortic Valve: The aortic valve is  tricuspid. Aortic valve regurgitation is moderate to severe. Aortic regurgitation PHT measures 376 msec. Mild aortic valve sclerosis is present, with no evidence of aortic valve stenosis. Aortic valve mean gradient measures 2.0 mmHg. Aortic valve peak gradient measures 4.9 mmHg. Pulmonic Valve: The pulmonic valve was normal in structure. Pulmonic valve regurgitation is trivial. No evidence of pulmonic stenosis. Aorta: The aortic root is normal in size and structure. Venous: The inferior vena cava is normal in size with greater than 50% respiratory variability, suggesting right atrial pressure of 3 mmHg. IAS/Shunts: No atrial level shunt detected by color flow Doppler. Additional Comments: Global hypokinesis with akinesis of the distal anteroseptal wall, apex and distal inferior wall; overall severe LV dysfunction. LV function worse compared to previous. A device lead is visualized.  LEFT VENTRICLE PLAX 2D LVIDd:         4.70 cm LVIDs:         3.90 cm     2D Longitudinal Strain LV PW:         1.10 cm     2D Strain GLS Avg:     -7.0 % LV IVS:        1.00 cm LVOT diam:     1.80 cm LV SV:         36 LV SV Index:   24          3D Volume EF: LVOT Area:     2.54 cm    3D EF:        36 %                            LV EDV:       108 ml                            LV ESV:       69 ml LV Volumes (MOD)           LV SV:        39 ml LV vol d, MOD A2C: 80.0 ml LV vol d, MOD A4C: 66.2 ml LV vol s,  MOD A2C: 57.9 ml LV vol s, MOD A4C: 42.1 ml LV SV MOD A2C:     22.1 ml LV SV MOD A4C:     66.2 ml LV SV MOD BP:      24.8 ml RIGHT VENTRICLE            IVC RV S prime:     5.00 cm/s  IVC diam: 2.00 cm TAPSE (M-mode): 0.7 cm LEFT ATRIUM             Index       RIGHT ATRIUM           Index LA diam:        3.90 cm 2.67 cm/m  RA Area:     19.60 cm LA Vol (A2C):   92.0 ml 63.02 ml/m RA Volume:   58.10 ml  39.80 ml/m LA Vol (A4C):   60.5 ml 41.44 ml/m LA Biplane Vol: 79.8 ml 54.66 ml/m  AORTIC VALVE                   PULMONIC VALVE AV  Area (Vmax):    1.59 cm    PR End Diast Vel: 1.62 msec AV Area (Vmean):   1.46 cm AV Area (VTI):     1.70 cm AV Vmax:           111.00 cm/s AV Vmean:          67.100 cm/s AV VTI:            0.209 m AV Peak Grad:      4.9 mmHg AV Mean Grad:      2.0 mmHg LVOT Vmax:         69.50 cm/s LVOT Vmean:        38.600 cm/s LVOT VTI:          0.140 m LVOT/AV VTI ratio: 0.67 AI PHT:            376 msec  AORTA Ao Root diam: 3.30 cm Ao Asc diam:  3.40 cm MITRAL VALVE               TRICUSPID VALVE MV Area (PHT): 4.83 cm    TR Peak grad:   34.6 mmHg MV Decel Time: 157 msec    TR Vmax:        294.00 cm/s MV E velocity: 65.95 cm/s                            SHUNTS                            Systemic VTI:  0.14 m                            Systemic Diam: 1.80 cm Kirk Ruths MD Electronically signed by Kirk Ruths MD Signature Date/Time: 06/22/2021/1:53:01 PM    Final      ASSESSMENT AND PLAN: This is a very pleasant 86 years old African-American female recently diagnosed with extensive stage small cell lung cancer presented with central right upper lobe lung mass in addition to left lower lobe lung nodule and mediastinal lymphadenopathy and metastatic bone lesion in the left iliac wing diagnosed in April 2021. The patient started her treatment today with carboplatin for AUC of 5 on day 1, etoposide 100 mg/M2 on days 1, 2 and 3 with  Neulasta support in addition to Imfinzi 1500 mg every 3 weeks during the chemotherapy course followed by 1500 mg every 4 weeks as maintenance. Status post 18 cycles.   The patient continues to tolerate her treatment with Imfinzi fairly well with no concerning adverse effects. She had repeat CT scan of the chest performed recently during her hospitalization and it showed no concerning findings for disease progression. I recommended for her to continue her current treatment with maintenance Imfinzi. I will see her back for follow-up visit in 4 weeks for evaluation before the next cycle of her  treatment. For the cough, she will start taking Mucinex at regular basis. For the dyspepsia and abdominal pain, I advised the patient to take omeprazole over-the-counter. She was advised to call immediately if she has any other concerning symptoms in the interval. The patient voices understanding of current disease status and treatment options and is in agreement with the current care plan.  All questions were answered. The patient knows to call the clinic with any problems, questions or concerns. We can certainly see the patient much sooner if necessary.  Disclaimer: This note was dictated with voice recognition software. Similar sounding words can inadvertently be transcribed and may not be corrected upon review.

## 2021-06-29 NOTE — Telephone Encounter (Signed)
[name] (daughter) called to let Dr.Jordan know that she picked up the prescription from her visit yesterday with her. Patient wasn't able to get the machine so she needs Dr.Jordan to send something in for it.  [name] could be contacted at [phone].  Please advise.

## 2021-06-30 NOTE — Telephone Encounter (Signed)
I spoke with [employer], they have shipped her nebulizer. I called and spoke with patient's daughter, she is aware.

## 2021-07-03 ENCOUNTER — Inpatient Hospital Stay: Payer: Medicare Other

## 2021-07-04 ENCOUNTER — Telehealth: Payer: Self-pay | Admitting: Pharmacist

## 2021-07-04 NOTE — Chronic Care Management (AMB) (Signed)
Chronic Care Management Pharmacy Assistant   Name: [name]  MRN: [id number] DOB: Jun 09, 1935   07-04-2021 APPOINTMENT REMINDER   Called [name], No answer, left message of appointment on 07-05-2021 at 12 via telephone visit with Jeni Salles, Pharm D. Notified to have all medications, supplements, blood pressure and/or blood sugar logs available during appointment and to return call if need to reschedule.  Care Gaps: Zoster Vaccines - Overdue Foot Exam - Overdue Eye Exam - Overdue DEXA - Overdue A1C- 6.8 BP- 116/46 AWV - 08-2020  Star Rating Drug: Losartan (Cozaar) 100 mg - Last [facility]12-[facility] Atorvastatin (Lipitor) 10 mg - Last filled 06-08-2021 [facility] Linagliptin (Tradjenta) 5 mg - Last filled 04-28-2021 90 DS at [facility]   Any gaps in medications fill history? None  Medications: Outpatient Encounter Medications as of 07/04/2021  Medication Sig Note   acetaminophen (TYLENOL) 500 MG tablet Take 500 mg by mouth every 6 (six) hours as needed for headache.    albuterol (VENTOLIN HFA) 108 (90 Base) MCG/ACT inhaler TAKE 2 PUFFS BY MOUTH EVERY 6 HOURS AS NEEDED FOR WHEEZE OR SHORTNESS OF BREATH (Patient taking differently: Inhale 2 puffs into the lungs every 6 (six) hours as needed for wheezing or shortness of breath.)    atorvastatin (LIPITOR) 10 MG tablet TAKE 1 TABLET BY MOUTH EVERY DAY (Patient taking differently: Take 10 mg by mouth daily.) 06/21/2021: Med on hold for medical appointment next week    carvedilol (COREG) 12.5 MG tablet Take 1 tablet (12.5 mg total) by mouth 2 (two) times daily with a meal.    feeding supplement (BOOST / RESOURCE BREEZE) LIQD Take 1 Container by mouth 3 (three) times daily between meals.    fluticasone-salmeterol (ADVAIR HFA) 115-21 MCG/ACT inhaler Inhale 2 puffs into the lungs 2 (two) times daily.    glucose blood (ONETOUCH VERIO) test strip Use to test 3-4 times daily.    guaiFENesin-dextromethorphan (ROBITUSSIN DM)  100-10 MG/5ML syrup Take 5 mLs by mouth every 4 (four) hours as needed for cough.    ipratropium-albuterol (DUONEB) 0.5-2.5 (3) MG/3ML SOLN Take 1.5 mLs by nebulization 3 (three) times daily as needed.    levothyroxine (SYNTHROID) 112 MCG tablet Take 1 tablet (112 mcg total) by mouth daily before breakfast.    linagliptin (TRADJENTA) 5 MG TABS tablet Take 1 tablet (5 mg total) by mouth daily.    Loperamide HCl (IMODIUM PO) Take by mouth as needed. (Patient not taking: Reported on 06/29/2021) 01/19/2021: Takes liquid imodium and states it works better than the tablet.   losartan (COZAAR) 100 MG tablet TAKE 1 TABLET BY MOUTH EVERY DAY (Patient taking differently: Take 100 mg by mouth daily.)    Multiple Vitamin (MULTIVITAMIN WITH MINERALS) TABS tablet Take 1 tablet by mouth daily. (Patient not taking: Reported on 06/29/2021)    nitroGLYCERIN (NITROSTAT) 0.4 MG SL tablet Place 1 tablet (0.4 mg total) under the tongue every 5 (five) minutes x 3 doses as needed for chest pain (Max 3 doses within 15 min. Call 911). (Patient not taking: Reported on 06/29/2021)    oxyCODONE-acetaminophen (PERCOCET) 10-325 MG tablet Take 0.5-1 tablets by mouth every 12 (twelve) hours as needed for pain. 06/29/2021: Just started   pantoprazole (PROTONIX) 40 MG tablet TAKE 1 TABLET BY MOUTH EVERY DAY (Patient taking differently: Take 40 mg by mouth daily.)    potassium chloride SA (KLOR-CON M20) 20 MEQ tablet Take 1 tablet (20 mEq total) by mouth daily.  prochlorperazine (COMPAZINE) 10 MG tablet Take 1 tablet (10 mg total) by mouth every 6 (six) hours as needed for nausea or vomiting. (Patient not taking: Reported on 06/29/2021)    Spacer/Aero-Holding Chambers (AEROCHAMBER PLUS) inhaler Use as instructed with inahaler. (Patient not taking: Reported on 06/29/2021)    No facility-administered encounter medications on file as of 07/04/2021.    [facility] Clinical Pharmacist Assistant [phone]

## 2021-07-05 ENCOUNTER — Other Ambulatory Visit (HOSPITAL_COMMUNITY): Payer: Self-pay

## 2021-07-05 ENCOUNTER — Telehealth: Payer: Medicare Other

## 2021-07-05 ENCOUNTER — Telehealth: Payer: Self-pay | Admitting: Pharmacist

## 2021-07-05 ENCOUNTER — Other Ambulatory Visit: Payer: Medicare Other | Admitting: Internal Medicine

## 2021-07-05 ENCOUNTER — Other Ambulatory Visit: Payer: Self-pay

## 2021-07-05 DIAGNOSIS — C7931 Secondary malignant neoplasm of brain: Secondary | ICD-10-CM | POA: Diagnosis not present

## 2021-07-05 DIAGNOSIS — R634 Abnormal weight loss: Secondary | ICD-10-CM | POA: Diagnosis not present

## 2021-07-05 DIAGNOSIS — R100 Acute abdomen: Secondary | ICD-10-CM | POA: Diagnosis not present

## 2021-07-05 DIAGNOSIS — R0600 Dyspnea, unspecified: Secondary | ICD-10-CM | POA: Diagnosis not present

## 2021-07-05 DIAGNOSIS — I5022 Chronic systolic (congestive) heart failure: Secondary | ICD-10-CM | POA: Diagnosis not present

## 2021-07-05 MED ORDER — DICYCLOMINE HCL 10 MG PO CAPS
10.0000 mg | ORAL_CAPSULE | Freq: Four times a day (QID) | ORAL | 1 refills | Status: DC
  Filled 2021-07-05: qty 120, 30d supply, fill #0
  Filled 2021-10-10: qty 120, 30d supply, fill #1

## 2021-07-05 NOTE — Progress Notes (Deleted)
Chronic Care Management Pharmacy Note  07/05/2021 Name:  [name] MRN:  [id number] DOB:  [birth year]  Summary: ***  Recommendations/Changes made from today's visit: ***  Plan: ***  Subjective: [name] is an 86 y.o. year old female who is a primary patient of [name], Robin So, MD.  The CCM team was consulted for assistance with disease management and care coordination needs.    Engaged with patient by telephone for follow up visit in response to provider referral for pharmacy case management and/or care coordination services.   Consent to Services:  The patient was given information about Chronic Care Management services, agreed to services, and gave verbal consent prior to initiation of services.  Please see initial visit note for detailed documentation.   Patient Care Team: [name], Robin G, MD as PCP - General (Family Medicine) Constance Haw, MD as PCP - Electrophysiology (Cardiology) [name], MD as PCP - Cardiology (Cardiology) Viona Gilmore, [facility] as Pharmacist (Pharmacist)  Recent office visits: 06/28/21 Robin [name], MD: Patient presented for hospitalization follow up. D/c fenofibrate.  04-26-2021 [name], Robin G, MD (PCP) - Patient presented for hospital follow up and other concerns. Prescribed ADVAIR [id number] MCG/ACT inhaler and TRADJENTA 5 MG Stopped TRICOR 48 MG.  Recent consult visits: 06/22/21 Robin Bears, MD (Oncology): Patient presented for Imfinzi infusion and lung cancer follow up.   06/01/21 Robin Bears, MD (Oncology): Patient presented for Imfinzi infusion and lung cancer follow up.   05/04/21 Robin Bears, MD (Oncology): Patient presented for Imfinzi infusion and lung cancer follow up.   03-29-2021 Robin Gibson, MD (Rad Oncol.) - Patient presented for final oncology treatment. No medication changes.  12/22/20 Robin Heilingoetter, PA-C (Oncology): Patient presented for Imfinzi infusion and lung cancer follow up.   11/25/20  Robin Shell, LCSW (oncology): Patient presented to fill out advanced directives documents.  11/24/20 Robin Bears, MD (Oncology): Patient presented for Imfinzi infusion and lung cancer follow up.   10/27/20 12/22/20 Robin Heilingoetter, PA-C (Oncology): Patient presented for Imfinzi infusion and lung cancer follow up.   09/19/20 Robin Bears, MD (Oncology): Patient presented for Imfinzi infusion and lung cancer follow up.   08/19/20 Robin Mings Margret Chance, MD (optometry): Unable to access notes.   Hospital visits: 9/21-9/23/22 Patient admitted to [facility]  for diarrhea and poor oral intake.  Medication Reconciliation was completed by comparing discharge summary, patient's EMR and Pharmacy list, and upon discussion with patient.   Patient visited [facility] on 04-17-2021 due to chest pain and weakness. Patient was there for 8 hours   New?Medications Started at [facility] Discharge:?? -started None   Medication Changes at Hospital Discharge: -Changed None   Medications Discontinued at Hospital Discharge: -Stopped None    Medications that remain the same after Hospital Discharge:??  -All other medications will remain the same.     Patient visited [facility] on 04-15-2021 due to Dermatitis, leg swelling and skin condition. Patient was at urgent care for 1 hour.   New?Medications Started at [facility] Discharge:?? -started Kenalog ointment 0.025%   Medication Changes at Hospital Discharge: -Changed None   Medications Discontinued at Hospital Discharge: -Stopped None    Medications that remain the same after Hospital Discharge:??  -All other medications will remain the same.      Objective:  Lab Results  Component Value Date   CREATININE 0.65 06/29/2021   BUN 11 06/29/2021   GFR 117.94 12/15/2019   GFRNONAA >60 06/29/2021  GFRAA >60 06/27/2020   NA 143 06/29/2021   K 3.4 (L) 06/29/2021   CALCIUM 9.0  06/29/2021   CO2 25 06/29/2021   GLUCOSE 93 06/29/2021    Lab Results  Component Value Date/Time   HGBA1C 6.8 (H) 06/22/2021 05:46 AM   HGBA1C 8.8 (A) 04/26/2021 08:09 AM   HGBA1C 6.2 01/17/2021 10:18 AM   FRUCTOSAMINE 254 01/17/2021 10:18 AM   FRUCTOSAMINE 229 06/10/2019 09:29 AM   GFR 117.94 12/15/2019 11:23 AM   GFR 85.50 09/30/2019 08:26 AM   MICROALBUR 6.0 (H) 06/10/2019 09:29 AM   MICROALBUR 4.2 (H) 11/12/2018 09:56 AM    Last diabetic Eye exam: No results found for: HMDIABEYEEXA  Last diabetic Foot exam: No results found for: HMDIABFOOTEX   Lab Results  Component Value Date   CHOL 116 01/17/2021   HDL 63.70 01/17/2021   LDLCALC 43 01/17/2021   TRIG 49.0 01/17/2021   CHOLHDL 2 01/17/2021    Hepatic Function Latest Ref Rng & Units 06/29/2021 06/01/2021 05/04/2021  Total Protein 6.5 - 8.1 [name]/dL 5.4(L) 5.4(L) 5.3(L)  Albumin 3.5 - 5.0 [name]/dL 2.6(L) 2.9(L) 2.5(L)  AST 15 - 41 U/L 10(L) 12(L) 12(L)  ALT 0 - 44 U/L '7 7 21  ' Alk Phosphatase 38 - 126 U/L 45 43 73  Total Bilirubin 0.3 - 1.2 mg/dL 0.7 0.6 0.3  Bilirubin, Direct 0.1 - 0.5 mg/dL - - -    Lab Results  Component Value Date/Time   TSH 0.473 06/29/2021 08:56 AM   TSH 0.358 06/01/2021 09:01 AM   TSH 0.64 06/10/2019 09:29 AM   TSH 0.42 10/09/2017 09:47 AM   FREET4 1.19 05/10/2017 10:59 AM    CBC Latest Ref Rng & Units 06/29/2021 06/23/2021 06/22/2021  WBC 4.0 - 10.5 K/uL 5.8 3.1(L) 4.7  Hemoglobin 12.0 - 15.0 [name]/dL 10.0(L) 10.0(L) 10.2(L)  Hematocrit 36.0 - 46.0 % 31.6(L) 33.0(L) 33.5(L)  Platelets 150 - 400 K/uL 212 142(L) 140(L)    No results found for: VD25OH  Clinical ASCVD: Yes  The ASCVD Risk score (Arnett DK, et al., 2019) failed to calculate for the following reasons:   The 2019 ASCVD risk score is only valid for ages 31 to 52    Depression screen PHQ 2/9 08/16/2020  Decreased Interest 0  Down, Depressed, Hopeless 0  PHQ - 2 Score 0  Some recent data might be hidden     Social History   Tobacco  Use  Smoking Status Some Days   Packs/day: 0.25   Years: 66.00   Pack years: 16.50   Types: Cigarettes   Last attempt to quit: 12/27/2019   Years since quitting: 1.5  Smokeless Tobacco Never  Tobacco Comments   03/16/21: Per pt, she recently began smoking 2-3 cigs a week but plans to stop again..   BP Readings from Last 3 Encounters:  06/29/21 (!) 116/46  06/23/21 109/74  06/01/21 (!) 113/46   Pulse Readings from Last 3 Encounters:  06/29/21 64  06/23/21 70  06/01/21 72   Wt Readings from Last 3 Encounters:  06/29/21 98 lb 4.8 oz (44.6 kg)  06/21/21 106 lb 0.7 oz (48.1 kg)  06/01/21 103 lb 14.4 oz (47.1 kg)   BMI Readings from Last 3 Encounters:  06/29/21 17.98 kg/m  06/28/21 19.40 kg/m  06/21/21 19.40 kg/m    Assessment/Interventions: Review of patient past medical history, allergies, medications, health status, including review of consultants reports, laboratory and other test data, was performed as part of comprehensive evaluation and provision of  chronic care management services.   SDOH:  (Social Determinants of Health) assessments and interventions performed: No  SDOH Screenings   Alcohol Screen: Not on file  Depression (PHQ2-9): Low Risk    PHQ-2 Score: 0  Financial Resource Strain: Low Risk    Difficulty of Paying Living Expenses: Not hard at all  Food Insecurity: No Food Insecurity   Worried About Charity fundraiser in the Last Year: Never true   Ran Out of Food in the Last Year: Never true  Housing: Low Risk    Last Housing Risk Score: 0  Physical Activity: Inactive   Days of Exercise per Week: 0 days   Minutes of Exercise per Session: 0 min  Social Connections: Socially Isolated   Frequency of Communication with Friends and Family: More than three times a week   Frequency of Social Gatherings with Friends and Family: Never   Attends Religious Services: Never   Marine scientist or Organizations: No   Attends Archivist Meetings:  Never   Marital Status: Widowed  Stress: No Stress Concern Present   Feeling of Stress : Not at all  Tobacco Use: High Risk   Smoking Tobacco Use: Some Days   Smokeless Tobacco Use: Never  Transportation Needs: No Transportation Needs   Lack of Transportation (Medical): No   Lack of Transportation (Non-Medical): No    CCM Care Plan  Allergies  Allergen Reactions   Seasonal Ic [Cholestatin]     Itchy eye and runny nose    Medications Reviewed Today     Reviewed by Robin Bears, MD (Physician) on 06/29/21 at [phone]  Med List Status: <None>   Medication Order Taking? Sig Documenting Provider Last Dose Status Informant  acetaminophen (TYLENOL) 500 MG tablet [id number] Yes Take 500 mg by mouth every 6 (six) hours as needed for headache. [provider] Taking Active Child  albuterol (VENTOLIN HFA) 108 (90 Base) MCG/ACT inhaler [id number] Yes TAKE 2 PUFFS BY MOUTH EVERY 6 HOURS AS NEEDED FOR WHEEZE OR SHORTNESS OF BREATH  Patient taking differently: Inhale 2 puffs into the lungs every 6 (six) hours as needed for wheezing or shortness of breath.   [name], Robin G, MD Taking Active Child  atorvastatin (LIPITOR) 10 MG tablet [id number] Yes TAKE 1 TABLET BY MOUTH EVERY DAY  Patient taking differently: Take 10 mg by mouth daily.   [name], MD Taking Active Child           Med Note Armen Pickup Jun 21, 2021  4:41 PM) Med on hold for medical appointment next week   carvedilol (COREG) 12.5 MG tablet [id number] Yes Take 1 tablet (12.5 mg total) by mouth 2 (two) times daily with a meal. [name], Robin G, MD Taking Active Child  feeding supplement (BOOST / RESOURCE BREEZE) LIQD [id number] Yes Take 1 Container by mouth 3 (three) times daily between meals. [provider]  Active     Discontinued 06/29/21 [phone] (Patient Preference)   fluticasone-salmeterol (ADVAIR HFA) 115-21 MCG/ACT inhaler [id number] Yes Inhale 2 puffs into the lungs 2 (two) times daily. [name], Robin  G, MD Taking Active Child  glucose blood [facility] VERIO) test strip [id number] Yes Use to test 3-4 times daily. [name], Robin G, MD Taking Active Child  guaiFENesin-dextromethorphan [facility] DM) 100-10 MG/5ML syrup [id number] Yes Take 5 mLs by mouth every 4 (four) hours as needed for cough. Modena Jansky, MD Taking Active   ipratropium-albuterol (DUONEB) 0.5-2.5 (3) MG/3ML SOLN  [id number] Yes Take 1.5 mLs by nebulization 3 (three) times daily as needed. [name], Robin G, MD Taking Active   levothyroxine (SYNTHROID) 112 MCG tablet [id number] Yes Take 1 tablet (112 mcg total) by mouth daily before breakfast. [name], Robin G, MD Taking Active Child  linagliptin (TRADJENTA) 5 MG TABS tablet [id number] Yes Take 1 tablet (5 mg total) by mouth daily. [name], Robin G, MD Taking Active Child  Loperamide HCl (IMODIUM PO) [id number] No Take by mouth as needed.  Patient not taking: Reported on 06/29/2021   [provider] Not Taking Active Child           Med Note Gloriann Loan, Ellard Artis   Thu Jan 19, 2021 10:55 AM) Takes liquid imodium and states it works better than the tablet.  losartan (COZAAR) 100 MG tablet [id number] Yes TAKE 1 TABLET BY MOUTH EVERY DAY  Patient taking differently: Take 100 mg by mouth daily.   [name], Robin G, MD Taking Active Child  Multiple Vitamin (MULTIVITAMIN WITH MINERALS) TABS tablet [id number] No Take 1 tablet by mouth daily.  Patient not taking: Reported on 06/29/2021   Modena Jansky, MD Not Taking Active   nitroGLYCERIN (NITROSTAT) 0.4 MG SL tablet [id number] No Place 1 tablet (0.4 mg total) under the tongue every 5 (five) minutes x 3 doses as needed for chest pain (Max 3 doses within 15 min. Call 911).  Patient not taking: Reported on 06/29/2021   Sherran Needs, NP Not Taking Active Child  oxyCODONE-acetaminophen (PERCOCET) 10-325 MG tablet [id number]  Take 0.5-1 tablets by mouth every 12 (twelve) hours as needed for pain. [name], Robin G, MD  Active            Med Note  Delories Heinz Jun 29, 2021  9:43 AM) Just started  pantoprazole (PROTONIX) 40 MG tablet [id number] Yes TAKE 1 TABLET BY MOUTH EVERY DAY  Patient taking differently: Take 40 mg by mouth daily.   [name], Robin G, MD Taking Active Child  potassium chloride SA (KLOR-CON M20) 20 MEQ tablet [id number] Yes Take 1 tablet (20 mEq total) by mouth daily. Modena Jansky, MD Taking Active   prochlorperazine (COMPAZINE) 10 MG tablet [id number] No Take 1 tablet (10 mg total) by mouth every 6 (six) hours as needed for nausea or vomiting.  Patient not taking: Reported on 06/29/2021   Robin Bears, MD Not Taking Active Child  Spacer/Aero-Holding Chambers (AEROCHAMBER PLUS) inhaler [id number] No Use as instructed with inahaler.  Patient not taking: Reported on 06/29/2021   [name], Robin G, MD Not Taking Active Child            Patient Active Problem List   Diagnosis Date Noted   QT prolongation 06/23/2021   Hypokalemia 06/21/2021   Secondary hypercoagulable state ([place]) 04/26/2021   Metastatic cancer to brain ([place]) 03/16/2021   Epigastric abdominal pain 04/27/2020   Port-A-Cath in place 04/11/2020   Small cell lung cancer, right ([place]) 01/28/2020   Encounter for antineoplastic chemotherapy 01/28/2020   Encounter for antineoplastic immunotherapy 01/28/2020   Goals of care, counseling/discussion 01/28/2020   Near syncope 01/05/2020   Mediastinal lymphadenopathy 01/01/2020   Multiple lung nodules on CT 01/01/2020   Atherosclerosis of aorta ([place]) 12/28/2019   Chronic diarrhea 12/15/2019   Hyperlipidemia associated with type 2 diabetes mellitus ([place]) 06/10/2019   Chronic rhinitis 02/28/2018   Tobacco use disorder 05/10/2017   Hypertension with heart disease 05/10/2017   Chest pain 03/10/2017   Acute thoracic back pain  Cardiac pacemaker in situ 12/24/2015   History of Graves' disease 12/24/2015   Current use of long term anticoagulation 12/24/2015   Type 2 diabetes mellitus with other  specified complication ([place])    Hypothyroidism 03/23/2010   Paroxysmal atrial fibrillation ([place]) 03/23/2010   COPD (chronic obstructive pulmonary disease) ([place]) 03/23/2010   CAD (coronary artery disease), native coronary artery    GERD     Immunization History  Administered Date(s) Administered   Fluad Quad(high Dose 65+) 10/05/2020   Influenza, High Dose Seasonal PF 07/31/2017, 07/24/2018   Influenza-Unspecified 07/31/2017   Pneumococcal Polysaccharide-23 04/27/2020   [facility]   Daughter manages her pill box and straighted it out for the whole week. No problems with cost of medications.    Conditions to be addressed/monitored:  Hypertension, Hyperlipidemia, Diabetes, Atrial Fibrillation, Coronary Artery Disease, GERD, COPD, Hypothyroidism and Chronic Diarrhea and Lung Cancer  Conditions addressed this visit: ***  There are no care plans that you recently modified to display for this patient.    Medication Assistance: None required.  Patient affirms current coverage meets needs.  Compliance/Adherence/Medication fill history: Care Gaps: Shingrix, foot exam, eye exam, DEXA A1C- 6.8 BP- 116/46   Star-Rating Drugs: Losartan (Cozaar) 100 mg - Last [facility] Atorvastatin (Lipitor) 10 mg - Last filled [facility]-08-2021 [facility] Linagliptin (Tradjenta) 5 mg - Last filled 04-28-2021 90 DS at [facility]   Patient's preferred pharmacy is:  [facility] [address] [address] Phone: [phone] Fax: [phone]  [id number] [place] [place] [place]Phone: [phone]: [phone] Uses pill box? Yes  Pt endorses 99% compliance  We discussed: Current pharmacy is preferred with insurance plan and patient is satisfied with pharmacy services Patient decided to: Continue current medication management strategy  Care Plan and Follow Up  Patient Decision:  Patient agrees to Care Plan and Follow-up.  Plan: Telephone follow up appointment with care management team member scheduled for:  6 months  MJeni Salles PharmD BHawaiiPharmacist LSulphur Springsat [phone]

## 2021-07-05 NOTE — Telephone Encounter (Signed)
  Chronic Care Management   Outreach Note  07/05/2021 Name: Derinda Bartus MRN: [id number] DOB: 10-Mar-1935  Referred by: [name], [name], MD  Patient had a phone appointment scheduled with clinical pharmacist today.  An unsuccessful telephone outreach was attempted today. The patient was referred to the pharmacist for assistance with care management and care coordination.   Spoke with female at residence who stated she was sleeping and would return call to: [phone] or to [facility]: [place], PharmD, [place] [facility] [place]

## 2021-07-07 ENCOUNTER — Ambulatory Visit: Payer: Medicare Other | Admitting: Radiation Oncology

## 2021-07-10 ENCOUNTER — Other Ambulatory Visit: Payer: Self-pay | Admitting: Interventional Cardiology

## 2021-07-13 ENCOUNTER — Other Ambulatory Visit: Payer: Self-pay

## 2021-07-13 ENCOUNTER — Ambulatory Visit (HOSPITAL_COMMUNITY): Payer: Medicare Other

## 2021-07-13 ENCOUNTER — Ambulatory Visit (HOSPITAL_COMMUNITY)
Admission: RE | Admit: 2021-07-13 | Discharge: 2021-07-13 | Disposition: A | Discharge status: Home / Self Care | Payer: Medicare Other | Source: Ambulatory Visit | Attending: Radiation Oncology | Admitting: Radiation Oncology

## 2021-07-13 DIAGNOSIS — C7931 Secondary malignant neoplasm of brain: Secondary | ICD-10-CM | POA: Diagnosis not present

## 2021-07-13 DIAGNOSIS — R519 Headache, unspecified: Secondary | ICD-10-CM | POA: Diagnosis not present

## 2021-07-13 MED ORDER — IOHEXOL 350 MG/ML SOLN
60.0000 mL | Freq: Once | INTRAVENOUS | Status: AC | PRN
  Administered 2021-07-13: 60 mL via INTRAVENOUS

## 2021-07-13 MED ORDER — HEPARIN SOD (PORK) LOCK FLUSH 100 UNIT/ML IV SOLN
INTRAVENOUS | Status: AC
  Filled 2021-07-13: qty 5

## 2021-07-13 MED ORDER — HEPARIN SOD (PORK) LOCK FLUSH 100 UNIT/ML IV SOLN
500.0000 [IU] | Freq: Once | INTRAVENOUS | Status: AC
  Administered 2021-07-13: 500 [IU] via INTRAVENOUS

## 2021-07-14 ENCOUNTER — Ambulatory Visit: Payer: Self-pay | Admitting: Radiation Oncology

## 2021-07-17 ENCOUNTER — Inpatient Hospital Stay: Payer: Medicare Other | Attending: Internal Medicine

## 2021-07-19 ENCOUNTER — Other Ambulatory Visit: Payer: Self-pay

## 2021-07-19 ENCOUNTER — Encounter: Payer: Self-pay | Admitting: Radiation Oncology

## 2021-07-19 ENCOUNTER — Ambulatory Visit
Admission: RE | Admit: 2021-07-19 | Discharge: 2021-07-19 | Disposition: A | Discharge status: Home / Self Care | Payer: Medicare Other | Source: Ambulatory Visit | Attending: Radiation Oncology | Admitting: Radiation Oncology

## 2021-07-19 ENCOUNTER — Ambulatory Visit: Payer: Medicare Other | Admitting: Radiation Oncology

## 2021-07-19 VITALS — BP 90/53 | HR 62 | Temp 97.9°F | Resp 20 | Ht 61.0 in | Wt 93.6 lb

## 2021-07-19 DIAGNOSIS — C7931 Secondary malignant neoplasm of brain: Secondary | ICD-10-CM

## 2021-07-19 DIAGNOSIS — Z08 Encounter for follow-up examination after completed treatment for malignant neoplasm: Secondary | ICD-10-CM | POA: Diagnosis not present

## 2021-07-19 DIAGNOSIS — C3491 Malignant neoplasm of unspecified part of right bronchus or lung: Secondary | ICD-10-CM | POA: Diagnosis not present

## 2021-07-19 NOTE — Progress Notes (Signed)
Ms. [name] presents today for follow-up after completing radiation to her brain on 03/29/2021 and to review CT scan results from 07/13/2021  Dose of Decadron, if applicable: no  Recent neurologic symptoms, if any:  Seizures: no Headaches: yes Nausea: yes Dizziness/ataxia: yes Difficulty with hand coordination: yes Focal numbness/weakness: yes Visual deficits/changes: yes Confusion/Memory deficits: yes  Additional Complaints / other details: Overall muscle weakness, extreme fatigue, weight loss of 13 lbs x2 months, severely reduced appetite, abdominal pain, reduced motor skills, hearing, vision and speech.  Had F/U with Dr. Curt Bears on 06/29/2021 --The patient continues to tolerate her treatment with Imfinzi fairly well with no concerning adverse effects. --She had repeat CT scan of the chest performed recently during her hospitalization and it showed no concerning findings for disease progression. --I recommended for her to continue her current treatment with maintenance Imfinzi. --I will see her back for follow-up visit in 4 weeks for evaluation before the next cycle of her treatment.

## 2021-07-21 ENCOUNTER — Encounter: Payer: Self-pay | Admitting: Radiation Oncology

## 2021-07-21 NOTE — Progress Notes (Signed)
Radiation Oncology         [phone] ________________________________  Name: [name] MRN: [id number]  Date: 07/19/2021  DOB: 04-27-1935  Follow-Up Visit Note  CC: [name], [name], MD  Curt Bears, MD  Diagnosis and Prior Radiotherapy:       ICD-10-CM   1. Metastatic cancer to brain [facility])  C79.31       CHIEF COMPLAINT:  Here for follow-up and surveillance of brain cancer  Narrative:   I met with the patient and her daughter today.  She reports that her neurologic deficits are stable.  Her main concern is extreme fatigue and weight loss as well as severely reduced appetite.  The patient and her daughter are also concerned about heart issues.  They are concerned about the risks of continuing systemic therapy.  They have met with palliative care in the past.  They are still considering hospice.                    CT scan of brain shows no evidence of progressive or recurrent metastatic disease  ALLERGIES:  is allergic to seasonal ic [cholestatin].  Meds: Current Outpatient Medications  Medication Sig Dispense Refill   acetaminophen (TYLENOL) 500 MG tablet Take 500 mg by mouth every 6 (six) hours as needed for headache.     albuterol (VENTOLIN HFA) 108 (90 Base) MCG/ACT inhaler TAKE 2 PUFFS BY MOUTH EVERY 6 HOURS AS NEEDED FOR WHEEZE OR SHORTNESS OF BREATH (Patient taking differently: Inhale 2 puffs into the lungs every 6 (six) hours as needed for wheezing or shortness of breath.) 1 each 1   atorvastatin (LIPITOR) 10 MG tablet TAKE 1 TABLET BY MOUTH EVERY DAY 30 tablet 11   carvedilol (COREG) 12.5 MG tablet Take 1 tablet (12.5 mg total) by mouth 2 (two) times daily with a meal. 180 tablet 3   dicyclomine (BENTYL) 10 MG capsule Take 1 capsule (10 mg total) by mouth 4 (four) times daily routinely for abdominal cramping. 120 capsule 1   feeding supplement (BOOST / RESOURCE BREEZE) LIQD Take 1 Container by mouth 3 (three) times daily between meals.     fluticasone-salmeterol  (ADVAIR HFA) 115-21 MCG/ACT inhaler Inhale 2 puffs into the lungs 2 (two) times daily. 12 g 4   glucose blood (ONETOUCH VERIO) test strip Use to test 3-4 times daily. 300 strip 4   guaiFENesin-dextromethorphan (ROBITUSSIN DM) 100-10 MG/5ML syrup Take 5 mLs by mouth every 4 (four) hours as needed for cough. 118 mL 0   ipratropium-albuterol (DUONEB) 0.5-2.5 (3) MG/3ML SOLN Take 1.5 mLs by nebulization 3 (three) times daily as needed. 360 mL 1   levothyroxine (SYNTHROID) 112 MCG tablet Take 1 tablet (112 mcg total) by mouth daily before breakfast. 90 tablet 2   linagliptin (TRADJENTA) 5 MG TABS tablet Take 1 tablet (5 mg total) by mouth daily. 90 tablet 1   Loperamide HCl (IMODIUM PO) Take by mouth as needed. (Patient not taking: Reported on 06/29/2021)     losartan (COZAAR) 100 MG tablet TAKE 1 TABLET BY MOUTH EVERY DAY (Patient taking differently: Take 100 mg by mouth daily.) 90 tablet 2   Multiple Vitamin (MULTIVITAMIN WITH MINERALS) TABS tablet Take 1 tablet by mouth daily. (Patient not taking: Reported on 06/29/2021)     nitroGLYCERIN (NITROSTAT) 0.4 MG SL tablet Place 1 tablet (0.4 mg total) under the tongue every 5 (five) minutes x 3 doses as needed for chest pain (Max 3 doses within 15 min. Call 911). (Patient  not taking: Reported on 06/29/2021) 10 tablet 0   oxyCODONE-acetaminophen (PERCOCET) 10-325 MG tablet Take 0.5-1 tablets by mouth every 12 (twelve) hours as needed for pain. 30 tablet 0   pantoprazole (PROTONIX) 40 MG tablet TAKE 1 TABLET BY MOUTH EVERY DAY (Patient taking differently: Take 40 mg by mouth daily.) 90 tablet 3   potassium chloride SA (KLOR-CON M20) 20 MEQ tablet Take 1 tablet (20 mEq total) by mouth daily.     prochlorperazine (COMPAZINE) 10 MG tablet Take 1 tablet (10 mg total) by mouth every 6 (six) hours as needed for nausea or vomiting. (Patient not taking: Reported on 06/29/2021) 30 tablet 1   Spacer/Aero-Holding Chambers (AEROCHAMBER PLUS) inhaler Use as instructed with  inahaler. (Patient not taking: Reported on 06/29/2021) 1 each 1   No current facility-administered medications for this encounter.    Physical Findings: The patient is in no acute distress. Patient is alert and oriented. Wt Readings from Last 3 Encounters:  07/19/21 93 lb 9.6 oz (42.5 kg)  06/29/21 98 lb 4.8 oz (44.6 kg)  06/21/21 106 lb 0.7 oz (48.1 kg)    height is '5\' 1"'  (1.549 m) and weight is 93 lb 9.6 oz (42.5 kg). Her temperature is 97.9 F (36.6 C). Her blood pressure is 90/53 (abnormal) and her pulse is 62. Her respiration is 20 and oxygen saturation is 100%. .  General: Alert and oriented, in no acute distress; in WC; frail appearing Heart is regular in rate and rhythm Chest clear to auscultation HEENT:  Oropharynx is clear Neuro: No focalities Psychiatric: Judgment and insight are intact. Affect is appropriate.   Lab Findings: Lab Results  Component Value Date   WBC 5.8 06/29/2021   HGB 10.0 (L) 06/29/2021   HCT 31.6 (L) 06/29/2021   MCV 94.9 06/29/2021   PLT 212 06/29/2021    Lab Results  Component Value Date   TSH 0.473 06/29/2021    Radiographic Findings: CT HEAD W & WO CONTRAST (5MM)  Result Date: 07/15/2021 CLINICAL DATA:  Brain/CNS neoplasm, surveillance. Metastatic lung cancer. Headache and dizziness. EXAM: CT HEAD WITHOUT AND WITH CONTRAST TECHNIQUE: Contiguous axial images were obtained from the base of the skull through the vertex without and with intravenous contrast CONTRAST:  23m OMNIPAQUE IOHEXOL 350 MG/ML SOLN COMPARISON:  03/14/2021 FINDINGS: Brain: No residual enhancement is seen at the sites of the treated left cerebellar and right pontine lesions. There is a small amount of vague hypodensity in the right pons, however no significant residual edema is present at the sites. Only a 5 mm focus of residual precontrast hyperdensity is present at the site of the treated right occipital mass, also with resolved edema. No new enhancing intracranial lesions  are identified. No acute infarct, intracranial hemorrhage, midline shift, or extra-axial fluid collection is evident. Mild cerebral atrophy is within normal limits for age. Hypodensities in the cerebral white matter bilaterally are unchanged and nonspecific but compatible with mild chronic small vessel ischemic disease. Vascular: Calcified atherosclerosis at the skull base. Gross patency of the major dural venous sinuses. Skull: No fracture or suspicious osseous lesion. Sinuses/Orbits: Small left maxillary sinus. Mild mucosal thickening in the ethmoid and sphenoid sinuses. Increased, large bilateral mastoid effusions. New partial opacification of the left middle ear cavity. Unremarkable orbits. Other: None. IMPRESSION: 1. Positive treatment response with essentially resolved right occipital and posterior fossa metastases. No residual edema. 2. No evidence of new intracranial metastases. Electronically Signed   By: ALogan BoresM.D.   On: 07/15/2021  11:21   CT CHEST ABDOMEN PELVIS W CONTRAST  Result Date: 06/21/2021 CLINICAL DATA:  Metastatic small-cell lung cancer EXAM: CT CHEST, ABDOMEN, AND PELVIS WITH CONTRAST TECHNIQUE: Multidetector CT imaging of the chest, abdomen and pelvis was performed following the standard protocol during bolus administration of intravenous contrast. CONTRAST:  57m OMNIPAQUE IOHEXOL 350 MG/ML SOLN COMPARISON:  03/14/2021 CT chest abdomen pelvis. FINDINGS: CT CHEST FINDINGS Cardiovascular: Coronary, aortic arch, and branch vessel atherosclerotic calcifications. Unchanged mild cardiomegaly. Right chest wall pacer with leads in the right atrial appendage and right ventricle. No pericardial effusion. Mediastinum/Nodes: The thyroid gland is not visualized. No enlarged mediastinal, hilar, or axillary lymph nodes. The trachea is midline. Lungs/Pleura: Advanced emphysema. No focal airspace consolidation, pleural effusion, or pneumothorax. Redemonstrated medial right upper lobe nodule  measures 15 x 14 mm (series 4, image 55), previously 15 x 15 mm when remeasured similarly, likely unchanged when accounting for slice selection. 8 mm triangular nodule in the left lower lobe (series 4, image 74), unchanged. Musculoskeletal: No acute osseous abnormality. CT ABDOMEN PELVIS FINDINGS Hepatobiliary: No focal liver abnormality is seen. Status post cholecystectomy. No biliary dilatation. Pancreas: Unremarkable. No pancreatic ductal dilatation or surrounding inflammatory changes. Spleen: Normal in size without focal abnormality. Adrenals/Urinary Tract: The adrenals are unremarkable. The kidneys enhance symmetrically with no hydronephrosis. Unchanged left mid kidney cyst. No nephrolithiasis. The bladder is unremarkable. Stomach/Bowel: The stomach is within normal limits. No bowel wall thickening or evidence of obstruction. Diverticulosis without evidence of diverticulitis. Vascular/Lymphatic: Aortic atherosclerosis. No enlarged abdominal or pelvic lymph nodes. Reproductive: Uterus and bilateral adnexa are unremarkable. Other: No free fluid or free air in the abdomen or pelvis. Musculoskeletal: No acute osseous abnormality. Right femoral nail. Unchanged sclerosis in the left iliac bone. IMPRESSION: 1. Stable exam. Unchanged appearance of right upper lobe and left lower lobe lung nodules. 2. No evidence of metastatic disease in the abdomen or pelvis. 3. No acute process in the chest, abdomen, or pelvis. 4. Aortic Atherosclerosis (ICD10-I70.0) and Emphysema (ICD10-J43.9). Electronically Signed   By: AMerilyn BabaM.D.   On: 06/21/2021 19:35   ECHOCARDIOGRAM COMPLETE  Result Date: 06/22/2021    ECHOCARDIOGRAM REPORT   Patient Name:   KDAISIA SLOMSKIDate of Exam: 06/22/2021 Medical Rec #:  [id number]  Height:       62.0 in Accession #:    [id number] Weight:       106.0 lb Date of Birth:  [birth year]  BSA:          1.460 m Patient Age:    88years    BP:           126/72 mmHg Patient Gender: F           HR:            65 bpm. Exam Location:  Inpatient Procedure: 2D Echo, 3D Echo, Cardiac Doppler, Color Doppler, Strain Analysis and            Intracardiac Opacification Agent Indications:    R07.9* Chest pain, unspecified. Elevated troponin.  History:        Patient has prior history of Echocardiogram examinations, most                 recent 01/06/2020. Abnormal ECG and Pacemaker, Arrythmias:Atrial                 Fibrillation, Signs/Symptoms:Syncope; Risk Factors:Diabetes,  Current Smoker and Dyslipidemia. Metastatic cancer.  Sonographer:    Roseanna Rainbow RDCS Referring Phys: Theola Sequin  Sonographer Comments: Global longitudinal strain was attempted. IMPRESSIONS  1. Global hypokinesis with akinesis of the distal anteroseptal wall, apex and distal inferior wall; overall severe LV dysfunction. LV function worse compared to previous.  2. Left ventricular ejection fraction, by estimation, is 25 to 30%. The left ventricle has severely decreased function. The left ventricle demonstrates global hypokinesis. Left ventricular diastolic parameters are indeterminate. The average left ventricular global longitudinal strain is -7.0 %. The global longitudinal strain is abnormal.  3. Right ventricular systolic function is normal. The right ventricular size is mildly enlarged. There is mildly elevated pulmonary artery systolic pressure.  4. Left atrial size was severely dilated.  5. Right atrial size was moderately dilated.  6. The mitral valve is normal in structure. Mild mitral valve regurgitation. No evidence of mitral stenosis.  7. Tricuspid valve regurgitation is moderate.  8. The aortic valve is tricuspid. Aortic valve regurgitation is moderate to severe. Mild aortic valve sclerosis is present, with no evidence of aortic valve stenosis.  9. The inferior vena cava is normal in size with greater than 50% respiratory variability, suggesting right atrial pressure of 3 mmHg. FINDINGS  Left Ventricle: Left ventricular  ejection fraction, by estimation, is 25 to 30%. The left ventricle has severely decreased function. The left ventricle demonstrates global hypokinesis. Definity contrast agent was given IV to delineate the left ventricular endocardial borders. The average left ventricular global longitudinal strain is -7.0 %. The global longitudinal strain is abnormal. The left ventricular internal cavity size was normal in size. There is no left ventricular hypertrophy. Left ventricular diastolic parameters are indeterminate. Right Ventricle: The right ventricular size is mildly enlarged. Right ventricular systolic function is normal. There is mildly elevated pulmonary artery systolic pressure. The tricuspid regurgitant velocity is 2.94 m/s, and with an assumed right atrial pressure of 3 mmHg, the estimated right ventricular systolic pressure is 67.0 mmHg. Left Atrium: Left atrial size was severely dilated. Right Atrium: Right atrial size was moderately dilated. Pericardium: There is no evidence of pericardial effusion. Mitral Valve: The mitral valve is normal in structure. Mild mitral valve regurgitation. No evidence of mitral valve stenosis. Tricuspid Valve: The tricuspid valve is normal in structure. Tricuspid valve regurgitation is moderate . No evidence of tricuspid stenosis. Aortic Valve: The aortic valve is tricuspid. Aortic valve regurgitation is moderate to severe. Aortic regurgitation PHT measures 376 msec. Mild aortic valve sclerosis is present, with no evidence of aortic valve stenosis. Aortic valve mean gradient measures 2.0 mmHg. Aortic valve peak gradient measures 4.9 mmHg. Pulmonic Valve: The pulmonic valve was normal in structure. Pulmonic valve regurgitation is trivial. No evidence of pulmonic stenosis. Aorta: The aortic root is normal in size and structure. Venous: The inferior vena cava is normal in size with greater than 50% respiratory variability, suggesting right atrial pressure of 3 mmHg. IAS/Shunts: No  atrial level shunt detected by color flow Doppler. Additional Comments: Global hypokinesis with akinesis of the distal anteroseptal wall, apex and distal inferior wall; overall severe LV dysfunction. LV function worse compared to previous. A device lead is visualized.  LEFT VENTRICLE PLAX 2D LVIDd:         4.70 cm LVIDs:         3.90 cm     2D Longitudinal Strain LV PW:         1.10 cm     2D Strain GLS Avg:     -  7.0 % LV IVS:        1.00 cm LVOT diam:     1.80 cm LV SV:         36 LV SV Index:   24          3D Volume EF: LVOT Area:     2.54 cm    3D EF:        36 %                            LV EDV:       108 ml                            LV ESV:       69 ml LV Volumes (MOD)           LV SV:        39 ml LV vol d, MOD A2C: 80.0 ml LV vol d, MOD A4C: 66.2 ml LV vol s, MOD A2C: 57.9 ml LV vol s, MOD A4C: 42.1 ml LV SV MOD A2C:     22.1 ml LV SV MOD A4C:     66.2 ml LV SV MOD BP:      24.8 ml RIGHT VENTRICLE            IVC RV S prime:     5.00 cm/s  IVC diam: 2.00 cm TAPSE (M-mode): 0.7 cm LEFT ATRIUM             Index       RIGHT ATRIUM           Index LA diam:        3.90 cm 2.67 cm/m  RA Area:     19.60 cm LA Vol (A2C):   92.0 ml 63.02 ml/m RA Volume:   58.10 ml  39.80 ml/m LA Vol (A4C):   60.5 ml 41.44 ml/m LA Biplane Vol: 79.8 ml 54.66 ml/m  AORTIC VALVE                   PULMONIC VALVE AV Area (Vmax):    1.59 cm    PR End Diast Vel: 1.62 msec AV Area (Vmean):   1.46 cm AV Area (VTI):     1.70 cm AV Vmax:           111.00 cm/s AV Vmean:          67.100 cm/s AV VTI:            0.209 m AV Peak Grad:      4.9 mmHg AV Mean Grad:      2.0 mmHg LVOT Vmax:         69.50 cm/s LVOT Vmean:        38.600 cm/s LVOT VTI:          0.140 m LVOT/AV VTI ratio: 0.67 AI PHT:            376 msec  AORTA Ao Root diam: 3.30 cm Ao Asc diam:  3.40 cm MITRAL VALVE               TRICUSPID VALVE MV Area (PHT): 4.83 cm    TR Peak grad:   34.6 mmHg MV Decel Time: 157 msec    TR Vmax:        294.00 cm/s MV E velocity: 65.95 cm/s  SHUNTS                            Systemic VTI:  0.14 m                            Systemic Diam: 1.80 cm Kirk Ruths MD Electronically signed by Kirk Ruths MD Signature Date/Time: 06/22/2021/1:53:01 PM    Final     Impression/Plan:     I met with her and her daughter today.  From a neurologic standpoint she is doing well symptomatically post Whole Brain RT   Their main concern is her reduced appetite, weight loss, weakness, heart issues, and concerns regarding whether to continue chemotherapy.  They wish to have a thorough discussion with Dr. Julien Nordmann when they see him next week.  I will send him a note to bring their concerns to him ahead of time.  She will see her nutritionist at that time.  Tentatively I will see her back in 3 months with repeat brain imaging at that time, unless she enrolls on hospice before then.  They are pleased with this plan and decline referral back to palliative care until they meet with medical oncology.  On date of service, in total, I spent 20 minutes on this encounter. Patient was seen in person. _____________________________________   [name], MD

## 2021-07-24 ENCOUNTER — Encounter: Payer: Self-pay | Admitting: Physician Assistant

## 2021-07-24 NOTE — Progress Notes (Deleted)
Cardiology Office Note    Date:  07/24/2021   ID:  [name], DOB April 23, 1935, MRN [id number]  PCP:  [name], [name], MD  Cardiologist:  [name], MD  Electrophysiologist:  Constance Haw, MD   Chief Complaint: ***  History of Present Illness:   [name] is a 86 y.o. female with history of metastatic small cell lung cancer with pulmonary mets, lymphadenopathy, bone mets, brain mets, COPD, HTN, hypothyroidism, permanent atrial fibrillation, tachy-brady syndrome s/p BosSci PPM, CAD (PTCA 1997, DES to Cx 2005), DM, chronic appearing anemia, recently diagnosed systolic CHF and prolonged QT interval who presents for hospital follow-up.   She was recently admitted with multiple complaints including generalized weakness, cough, chills, nausea, 1 episode of vomiting, and watery diarrhea for 2 weeks (although has had chronic diarrhea as well). ED note mentioned Covid but SARS-CoV2 testing was negative on admission. She was found to have severe hypokalemia of 2.2, hypomagnesemia of 1.6, Hgb 10.7 (similar to prior), mild thrombocytopenia 139, elevated troponin to 782, and new systolic dysfunction by echocardiogram. 2D echo showed EF 25-30% + WMA as below, mildly elevated PASP, mildly enlarged RV, severe LAE, moderate RAE, mild MR, moderate-severe AI. Her elevated troponin was suspected due to demand ischemia in the setting of increased metabolic demands, superimposed on underlying CAD, but could not fully exclude ACS. Given constellation of symptoms and comorbidities including metastatic cancer with active brain mets, she was not felt to be a candidate for invasive ischemic evaluation, and not a good candidate for additional blood thinners. There was question of whether perhaps her new onset HF was related to cardiotoxicity from immunotherapy which was relayed to oncology.  Ekg Anticoagulation to oncology they continued this 06/29/21 OV Repeat BMET  CAD with recent elevated  troponin Chronic systolic CHF Moderate - severe aortic insufficiency Prolonged QT Permanent atrial fib, also hx of PPM   Labwork independently reviewed: 06/2021 Hgb 10.0, K 3.4, Cr 0.65, albumin 2.6, TSH wnl, a1c 6.8 12/2020 LDL 43   Past Medical History:  Diagnosis Date   Anemia    CAD (coronary artery disease), native coronary artery    PTCA of distal right coronary artery 1997 Cypher stents to circumflex 2005 Cardiac cath in 2011 with patent stent to circumflex and moderate disease elsewhere treated medically    Cardiac pacemaker in situ 12/24/2015   Original implant reportedly in 1991 for tachybradycardia syndrome, generator change in 1999, 2004 and evidently again in 2016 in [place]    COPD (chronic obstructive pulmonary disease) ([place])    Diabetes mellitus without complication ([place])    Hypertension    Hypothyroidism 03/23/2010   Lung cancer ([place])    Pacemaker    Permanent atrial fibrillation ([place]) 03/23/2010    Past Surgical History:  Procedure Laterality Date   BIOPSY  01/08/2020   Procedure: BIOPSY;  Surgeon: Garner Nash, DO;  Location: [facility];  Service: Pulmonary;;   BRONCHIAL BRUSHINGS  01/08/2020   Procedure: BRONCHIAL BRUSHINGS;  Surgeon: Garner Nash, DO;  Location: [facility];  Service: Pulmonary;;   BRONCHIAL WASHINGS  01/08/2020   Procedure: BRONCHIAL WASHINGS;  Surgeon: Garner Nash, DO;  Location: [facility];  Service: Pulmonary;;   CARDIAC CATHETERIZATION N/A 12/26/2015   Procedure: Left Heart Cath and Coronary Angiography;  Surgeon: Peter M [name], MD;  Location: [facility];  Service: Cardiovascular;  Laterality: N/A;   CARDIOVERSION  05/2015   CHOLECYSTECTOMY  2012   CORONARY ANGIOPLASTY WITH STENT  PLACEMENT  1990   ENDOBRONCHIAL ULTRASOUND  01/08/2020   Procedure: ENDOBRONCHIAL ULTRASOUND;  Surgeon: Garner Nash, DO;  Location: [facility];  Service: Pulmonary;;   FINE NEEDLE ASPIRATION BIOPSY  01/08/2020   Procedure: FINE  NEEDLE ASPIRATION BIOPSY;  Surgeon: Garner Nash, DO;  Location: [facility];  Service: Pulmonary;;   HERNIA REPAIR     IR IMAGING GUIDED PORT INSERTION  02/18/2020   PACEMAKER GENERATOR CHANGE  4034,7425, 2016   PACEMAKER INSERTION  1991   VIDEO BRONCHOSCOPY WITH ENDOBRONCHIAL ULTRASOUND N/A 01/08/2020   Procedure: VIDEO BRONCHOSCOPY;  Surgeon: Garner Nash, DO;  Location: [facility];  Service: Pulmonary;  Laterality: N/A;    Current Medications: No outpatient medications have been marked as taking for the 07/28/21 encounter (Appointment) with Charlie Pitter, PA-C.   ***   Allergies:   Seasonal ic [cholestatin]   Social History   Socioeconomic History   Marital status: Widowed    Spouse name: Not on file   Number of children: Not on file   Years of education: Not on file   Highest education level: Not on file  Occupational History   Occupation: retired  Tobacco Use   Smoking status: Some Days    Packs/day: 0.25    Years: 66.00    Pack years: 16.50    Types: Cigarettes    Last attempt to quit: 12/27/2019    Years since quitting: 1.5   Smokeless tobacco: Never   Tobacco comments:    03/16/21: Per pt, she recently began smoking 2-3 cigs a week but plans to stop again..  Vaping Use   Vaping Use: Never used  Substance and Sexual Activity   Alcohol use: No    Alcohol/week: 0.0 standard drinks   Drug use: No   Sexual activity: Not on file  Other Topics Concern   Not on file  Social History Narrative   Not on file   Social Determinants of Health   Financial Resource Strain: Low Risk    Difficulty of Paying Living Expenses: Not hard at all  Food Insecurity: No Food Insecurity   Worried About Charity fundraiser in the Last Year: Never true   [place] in the Last Year: Never true  Transportation Needs: No Transportation Needs   Lack of Transportation (Medical): No   Lack of Transportation (Non-Medical): No  Physical Activity: Inactive   Days of Exercise  per Week: 0 days   Minutes of Exercise per Session: 0 min  Stress: No Stress Concern Present   Feeling of Stress : Not at all  Social Connections: Socially Isolated   Frequency of Communication with Friends and Family: More than three times a week   Frequency of Social Gatherings with Friends and Family: Never   Attends Religious Services: Never   Marine scientist or Organizations: No   Attends Archivist Meetings: Never   Marital Status: Widowed     Family History:  The patient's ***family history includes Cirrhosis in her brother; Heart attack in her mother; Heart disease in her father and mother. There is no history of Colon cancer or Stomach cancer.  ROS:   Please see the history of present illness. Otherwise, review of systems is positive for ***.  All other systems are reviewed and otherwise negative.    EKGs/Labs/Other Studies Reviewed:    Studies reviewed are outlined and summarized above. Reports included below if pertinent.      2D echo 06/22/21  1. Global hypokinesis with akinesis of the distal anteroseptal wall, apex  and distal inferior wall; overall severe LV dysfunction. LV function worse  compared to previous.   2. Left ventricular ejection fraction, by estimation, is 25 to 30%. The  left ventricle has severely decreased function. The left ventricle  demonstrates global hypokinesis. Left ventricular diastolic parameters are  indeterminate. The average left  ventricular global longitudinal strain is -7.0 %. The global longitudinal  strain is abnormal.   3. Right ventricular systolic function is normal. The right ventricular  size is mildly enlarged. There is mildly elevated pulmonary artery  systolic pressure.   4. Left atrial size was severely dilated.   5. Right atrial size was moderately dilated.   6. The mitral valve is normal in structure. Mild mitral valve  regurgitation. No evidence of mitral stenosis.   7. Tricuspid valve regurgitation  is moderate.   8. The aortic valve is tricuspid. Aortic valve regurgitation is moderate  to severe. Mild aortic valve sclerosis is present, with no evidence of  aortic valve stenosis.   9. The inferior vena cava is normal in size with greater than 50%  respiratory variability, suggesting right atrial pressure of 3 mmHg.       EKG:  EKG is ordered today, personally reviewed, demonstrating ***  Recent Labs: 04/17/2021: B Natriuretic Peptide 186.3 06/23/2021: Magnesium 1.8 06/29/2021: ALT 7; BUN 11; Creatinine 0.65; Hemoglobin 10.0; Platelet Count 212; Potassium 3.4; Sodium 143; TSH 0.473  Recent Lipid Panel    Component Value Date/Time   CHOL 116 01/17/2021 1018   TRIG 49.0 01/17/2021 1018   HDL 63.70 01/17/2021 1018   CHOLHDL 2 01/17/2021 1018   VLDL 9.8 01/17/2021 1018   LDLCALC 43 01/17/2021 1018    PHYSICAL EXAM:    VS:  LMP  (LMP Unknown)   BMI: There is no height or weight on file to calculate BMI.  GEN: Well nourished, well developed female in no acute distress HEENT: normocephalic, atraumatic Neck: no JVD, carotid bruits, or masses Cardiac: ***RRR; no murmurs, rubs, or gallops, no edema  Respiratory:  clear to auscultation bilaterally, normal work of breathing GI: soft, nontender, nondistended, + BS MS: no deformity or atrophy Skin: warm and dry, no rash Neuro:  Alert and Oriented x 3, Strength and sensation are intact, follows commands Psych: euthymic mood, full affect  Wt Readings from Last 3 Encounters:  07/19/21 93 lb 9.6 oz (42.5 kg)  06/29/21 98 lb 4.8 oz (44.6 kg)  06/21/21 106 lb 0.7 oz (48.1 kg)     ASSESSMENT & PLAN:   ***     Disposition: F/u with ***   Medication Adjustments/Labs and Tests Ordered: Current medicines are reviewed at length with the patient today.  Concerns regarding medicines are outlined above. Medication changes, Labs and Tests ordered today are summarized above and listed in the Patient Instructions accessible in Encounters.    Signed, Charlie Pitter, PA-C  07/24/2021 11:25 AM    [address], [place], [place]  [id number] Phone: [phone]; Fax: [phone]

## 2021-07-27 ENCOUNTER — Inpatient Hospital Stay: Payer: Medicare Other

## 2021-07-27 ENCOUNTER — Inpatient Hospital Stay: Payer: Medicare Other | Admitting: Dietician

## 2021-07-27 ENCOUNTER — Inpatient Hospital Stay: Payer: Medicare Other | Admitting: Internal Medicine

## 2021-07-27 ENCOUNTER — Telehealth: Payer: Self-pay | Admitting: Medical Oncology

## 2021-07-27 NOTE — Telephone Encounter (Signed)
Hospice referral done and [name] will be attending.

## 2021-07-27 NOTE — Telephone Encounter (Signed)
[name] does not want " to go to any more doctor's appointment". She is weak.  [name] said she thinks it  is time for Hospice.

## 2021-07-28 ENCOUNTER — Ambulatory Visit: Payer: Medicare Other | Admitting: Physician Assistant

## 2021-07-28 DIAGNOSIS — I351 Nonrheumatic aortic (valve) insufficiency: Secondary | ICD-10-CM

## 2021-07-28 DIAGNOSIS — I2510 Atherosclerotic heart disease of native coronary artery without angina pectoris: Secondary | ICD-10-CM

## 2021-07-28 DIAGNOSIS — I5022 Chronic systolic (congestive) heart failure: Secondary | ICD-10-CM

## 2021-07-28 DIAGNOSIS — I4821 Permanent atrial fibrillation: Secondary | ICD-10-CM

## 2021-07-28 DIAGNOSIS — R9431 Abnormal electrocardiogram [ECG] [EKG]: Secondary | ICD-10-CM

## 2021-07-28 DIAGNOSIS — Z950 Presence of cardiac pacemaker: Secondary | ICD-10-CM

## 2021-08-02 ENCOUNTER — Other Ambulatory Visit: Payer: Self-pay | Admitting: Family Medicine

## 2021-08-02 DIAGNOSIS — E890 Postprocedural hypothyroidism: Secondary | ICD-10-CM

## 2021-08-09 ENCOUNTER — Ambulatory Visit (INDEPENDENT_AMBULATORY_CARE_PROVIDER_SITE_OTHER)

## 2021-08-09 DIAGNOSIS — I495 Sick sinus syndrome: Secondary | ICD-10-CM

## 2021-08-09 LAB — CUP PACEART REMOTE DEVICE CHECK
Battery Remaining Longevity: 5 mo
Battery Remaining Percentage: 7 %
Brady Statistic RA Percent Paced: 0 %
Brady Statistic RV Percent Paced: 41 %
Date Time Interrogation Session: 20221109021100
Implantable Lead Implant Date: 19990610
Implantable Lead Implant Date: 20160713
Implantable Lead Location: 753859
Implantable Lead Location: 753860
Implantable Lead Model: 4136
Implantable Lead Serial Number: 29813363
Implantable Pulse Generator Implant Date: 20150130
Lead Channel Impedance Value: 492 Ohm
Lead Channel Impedance Value: 604 Ohm
Lead Channel Pacing Threshold Amplitude: 0.7 V
Lead Channel Pacing Threshold Amplitude: 1.1 V
Lead Channel Pacing Threshold Pulse Width: 0.4 ms
Lead Channel Pacing Threshold Pulse Width: 0.5 ms
Lead Channel Setting Pacing Amplitude: 1.2 V
Lead Channel Setting Pacing Amplitude: 2 V
Lead Channel Setting Pacing Pulse Width: 0.4 ms
Lead Channel Setting Sensing Sensitivity: 2 mV
Pulse Gen Serial Number: 393477

## 2021-08-10 ENCOUNTER — Telehealth: Payer: Self-pay

## 2021-08-10 NOTE — Telephone Encounter (Signed)
Scheduled remtoe monitor received, device has 5 month remaining until ERI.    Monthly battery checks scheduled in Epic and [employer], next check will be 09/08/21.  Left detailed message for pt daughter [name] (DPR on file).  Also sending mychart message.    Pt overdue for in-clinic check, LOV 11/10/19.  Sending message to scheduling to contact pt/ family.

## 2021-08-17 NOTE — Progress Notes (Signed)
Remote pacemaker transmission.   

## 2021-08-18 ENCOUNTER — Other Ambulatory Visit: Payer: Self-pay

## 2021-08-18 ENCOUNTER — Encounter: Payer: Self-pay | Admitting: Physician Assistant

## 2021-08-18 ENCOUNTER — Ambulatory Visit (INDEPENDENT_AMBULATORY_CARE_PROVIDER_SITE_OTHER): Payer: Medicare Other | Admitting: Physician Assistant

## 2021-08-18 DIAGNOSIS — Z950 Presence of cardiac pacemaker: Secondary | ICD-10-CM

## 2021-08-18 DIAGNOSIS — I2510 Atherosclerotic heart disease of native coronary artery without angina pectoris: Secondary | ICD-10-CM

## 2021-08-18 DIAGNOSIS — R9431 Abnormal electrocardiogram [ECG] [EKG]: Secondary | ICD-10-CM

## 2021-08-18 DIAGNOSIS — I4821 Permanent atrial fibrillation: Secondary | ICD-10-CM | POA: Diagnosis not present

## 2021-08-18 DIAGNOSIS — I428 Other cardiomyopathies: Secondary | ICD-10-CM | POA: Diagnosis not present

## 2021-08-18 DIAGNOSIS — I480 Paroxysmal atrial fibrillation: Secondary | ICD-10-CM

## 2021-08-18 LAB — CUP PACEART INCLINIC DEVICE CHECK
Date Time Interrogation Session: 20221118184625
Implantable Lead Implant Date: 19990610
Implantable Lead Implant Date: 20160713
Implantable Lead Location: 753859
Implantable Lead Location: 753860
Implantable Lead Model: 4136
Implantable Lead Serial Number: 29813363
Implantable Pulse Generator Implant Date: 20150130
Lead Channel Impedance Value: 460 Ohm
Lead Channel Impedance Value: 598 Ohm
Lead Channel Pacing Threshold Amplitude: 0.7 V
Lead Channel Pacing Threshold Pulse Width: 0.4 ms
Lead Channel Sensing Intrinsic Amplitude: 0.8 mV
Lead Channel Sensing Intrinsic Amplitude: 7 mV
Lead Channel Setting Pacing Amplitude: 1.3 V
Lead Channel Setting Pacing Amplitude: 2 V
Lead Channel Setting Pacing Pulse Width: 0.4 ms
Lead Channel Setting Sensing Sensitivity: 2 mV
Pulse Gen Serial Number: 393477

## 2021-08-18 NOTE — Patient Instructions (Addendum)
Medication Instructions:   Your physician recommends that you continue on your current medications as directed. Please refer to the Current Medication list given to you today.   *If you need a refill on your cardiac medications before your next appointment, please call your pharmacy*   Lab Work: [place]   If you have labs (blood work) drawn today and your tests are completely normal, you will receive your results only by: [place] (if you have MyChart) OR A paper copy in the mail If you have any lab test that is abnormal or we need to change your treatment, we will call you to review the results.   Testing/Procedures: NONE ORDERED  TODAY      Follow-Up: At [facility], you and your health needs are our priority.  As part of our continuing mission to provide you with exceptional heart care, we have created designated Provider Care Teams.  These Care Teams include your primary Cardiologist (physician) and Advanced Practice Providers (APPs -  Physician Assistants and Nurse Practitioners) who all work together to provide you with the care you need, when you need it.  We recommend signing up for the patient portal called "MyChart".  Sign up information is provided on this After Visit Summary.  MyChart is used to connect with patients for Virtual Visits (Telemedicine).  Patients are able to view lab/test results, encounter notes, upcoming appointments, etc.  Non-urgent messages can be sent to your provider as well.   To learn more about what you can do with MyChart, go to [email or web address].    Your next appointment: WITH DR Zigmund Daniel ( [place])  4 month(s)  ( CONTACT [facility] FOR SCHEDULING)  The format for your next appointment:   In Person  Provider:   Allegra Lai, MD{   Other Instructions

## 2021-08-18 NOTE — Progress Notes (Signed)
Cardiology Office Note Date:  08/18/2021  Patient ID:  [name], [name] [birth year], MRN [id number] PCP:  [name], [name], MD  Cardiologist:  Dr. Tamala Julian Electrophysiologist: Dr .Curt Bears     Chief Complaint:  post hospital follow up  History of Present Illness: [name] is a 86 y.o. female with history of CAD (remote PCI to RCA, LCx), COPD, AFib (not on a/c it seems due to some bleeding noted on her CT of the head), tachy/brady w/PPM, HTN, HLD, DM. Lung cancer with mets to brain, bone, lymphadenopathy Chronic diarrhea  She comes today to be seen for Dr. Curt Bears, last seen by him June 2022.  Discussed that previously with d/w the patient, planned for rate control strategy. Was pending start of radiation therapy with hx of lung cancer and new spot found on her brain. No changes were made.  She had a hospital stay in September, went initially for generalized weakness, cough, chills, nausea, 1 episode of vomiting, and watery diarrhea for 2 weeks, labs quite abnormal including marked hypokalemia, low mag, she was admitted EKG was abnormal  and cardiology consulted, HS Trop abn Echo noted marked reduction in her LVEF, this suspect 2/2 her chemo Ultimately given metastatic cancer, comorbid conditions/constellation of medical issues not felt a candidate for ischemic w/u and recommended traetment towards QOL rather then aggressive interventions  TODAY She is accompanied by her daughter that she lives with Since her hospitalization the patient has decided no longer to pursue chemo or further management of her cancer She is under Hospice care, still getting her med, though no plans for any aggressive/invasive care She is ambulatory to some degree She denies any CP, has some pain right shoulder blade > the left She has poor appetite and not eating well, recently started on steroid which has helped her oral intake and is more animated as well.  No near syncope or syncope  She is off a/c, her  daughter says that this is indeed 2/2 prior bleeding found in her head     Device information BSci dual chamber PPM last gen change, 10/30/2013 RA, guidant 04/13/2015 RV lead, intermedics, 03/10/1998   AAD Multaq, intolerant of BID dosing with G side effects   Past Medical History:  Diagnosis Date   Anemia    CAD (coronary artery disease), native coronary artery    PTCA of distal right coronary artery 1997 Cypher stents to circumflex 2005 Cardiac cath in 2011 with patent stent to circumflex and moderate disease elsewhere treated medically    Cardiac pacemaker in situ 12/24/2015   Original implant reportedly in 1991 for tachybradycardia syndrome, generator change in 1999, 2004 and evidently again in 2016 in [place]    Chronic systolic CHF (congestive heart failure) (HCC)    COPD (chronic obstructive pulmonary disease) ([place])    Diabetes mellitus without complication ([place])    Hypertension    Hypothyroidism 03/23/2010   Lung cancer ([place])    Pacemaker    Permanent atrial fibrillation ([place]) 03/23/2010   Prolonged Q-T interval on ECG     Past Surgical History:  Procedure Laterality Date   BIOPSY  01/08/2020   Procedure: BIOPSY;  Surgeon: Garner Nash, DO;  Location: [facility];  Service: Pulmonary;;   BRONCHIAL BRUSHINGS  01/08/2020   Procedure: BRONCHIAL BRUSHINGS;  Surgeon: Garner Nash, DO;  Location: [facility];  Service: Pulmonary;;   BRONCHIAL WASHINGS  01/08/2020   Procedure: BRONCHIAL WASHINGS;  Surgeon: Garner Nash, DO;  Location: [facility];  Service: Pulmonary;;   CARDIAC CATHETERIZATION N/A 12/26/2015   Procedure: Left Heart Cath and Coronary Angiography;  Surgeon: Peter M [name], MD;  Location: [facility];  Service: Cardiovascular;  Laterality: N/A;   CARDIOVERSION  05/2015   CHOLECYSTECTOMY  2012   CORONARY ANGIOPLASTY WITH STENT PLACEMENT  1990   ENDOBRONCHIAL ULTRASOUND  01/08/2020   Procedure: ENDOBRONCHIAL ULTRASOUND;  Surgeon: Garner Nash, DO;  Location: [facility];  Service: Pulmonary;;   FINE NEEDLE ASPIRATION BIOPSY  01/08/2020   Procedure: FINE NEEDLE ASPIRATION BIOPSY;  Surgeon: Garner Nash, DO;  Location: [facility];  Service: Pulmonary;;   HERNIA REPAIR     IR IMAGING GUIDED PORT INSERTION  02/18/2020   PACEMAKER GENERATOR CHANGE  2119,4174, 2016   PACEMAKER INSERTION  1991   VIDEO BRONCHOSCOPY WITH ENDOBRONCHIAL ULTRASOUND N/A 01/08/2020   Procedure: VIDEO BRONCHOSCOPY;  Surgeon: Garner Nash, DO;  Location: [facility];  Service: Pulmonary;  Laterality: N/A;    Current Outpatient Medications  Medication Sig Dispense Refill   acetaminophen (TYLENOL) 500 MG tablet Take 500 mg by mouth every 6 (six) hours as needed for headache.     albuterol (VENTOLIN HFA) 108 (90 Base) MCG/ACT inhaler TAKE 2 PUFFS BY MOUTH EVERY 6 HOURS AS NEEDED FOR WHEEZE OR SHORTNESS OF BREATH (Patient taking differently: Inhale 2 puffs into the lungs every 6 (six) hours as needed for wheezing or shortness of breath.) 1 each 1   atorvastatin (LIPITOR) 10 MG tablet TAKE 1 TABLET BY MOUTH EVERY DAY 30 tablet 11   carvedilol (COREG) 12.5 MG tablet Take 1 tablet (12.5 mg total) by mouth 2 (two) times daily with a meal. 180 tablet 3   dicyclomine (BENTYL) 10 MG capsule Take 1 capsule (10 mg total) by mouth 4 (four) times daily routinely for abdominal cramping. 120 capsule 1   feeding supplement (BOOST / RESOURCE BREEZE) LIQD Take 1 Container by mouth 3 (three) times daily between meals.     fluticasone-salmeterol (ADVAIR HFA) 115-21 MCG/ACT inhaler Inhale 2 puffs into the lungs 2 (two) times daily. 12 g 4   glucose blood (ONETOUCH VERIO) test strip Use to test 3-4 times daily. 300 strip 4   guaiFENesin-dextromethorphan (ROBITUSSIN DM) 100-10 MG/5ML syrup Take 5 mLs by mouth every 4 (four) hours as needed for cough. 118 mL 0   ipratropium-albuterol (DUONEB) 0.5-2.5 (3) MG/3ML SOLN Take 1.5 mLs by nebulization 3 (three) times daily as  needed. 360 mL 1   levothyroxine (SYNTHROID) 112 MCG tablet TAKE 1 TABLET BY MOUTH DAILY BEFORE BREAKFAST. 90 tablet 2   linagliptin (TRADJENTA) 5 MG TABS tablet Take 1 tablet (5 mg total) by mouth daily. 90 tablet 1   Loperamide HCl (IMODIUM PO) Take by mouth as needed. (Patient not taking: Reported on 06/29/2021)     losartan (COZAAR) 100 MG tablet TAKE 1 TABLET BY MOUTH EVERY DAY (Patient taking differently: Take 100 mg by mouth daily.) 90 tablet 2   Multiple Vitamin (MULTIVITAMIN WITH MINERALS) TABS tablet Take 1 tablet by mouth daily. (Patient not taking: Reported on 06/29/2021)     nitroGLYCERIN (NITROSTAT) 0.4 MG SL tablet Place 1 tablet (0.4 mg total) under the tongue every 5 (five) minutes x 3 doses as needed for chest pain (Max 3 doses within 15 min. Call 911). (Patient not taking: Reported on 06/29/2021) 10 tablet 0   oxyCODONE-acetaminophen (PERCOCET) 10-325 MG tablet Take 0.5-1 tablets by mouth every 12 (twelve) hours as needed for pain. 30 tablet 0  pantoprazole (PROTONIX) 40 MG tablet TAKE 1 TABLET BY MOUTH EVERY DAY (Patient taking differently: Take 40 mg by mouth daily.) 90 tablet 3   potassium chloride SA (KLOR-CON M20) 20 MEQ tablet Take 1 tablet (20 mEq total) by mouth daily.     prochlorperazine (COMPAZINE) 10 MG tablet Take 1 tablet (10 mg total) by mouth every 6 (six) hours as needed for nausea or vomiting. (Patient not taking: Reported on 06/29/2021) 30 tablet 1   Spacer/Aero-Holding Chambers (AEROCHAMBER PLUS) inhaler Use as instructed with inahaler. (Patient not taking: Reported on 06/29/2021) 1 each 1   No current facility-administered medications for this visit.    Allergies:   Seasonal ic [cholestatin]   Social History:  The patient  reports that she has been smoking cigarettes. She has a 16.50 pack-year smoking history. She has never used smokeless tobacco. She reports that she does not drink alcohol and does not use drugs.   Family History:  The patient's family history  includes Cirrhosis in her brother; Heart attack in her mother; Heart disease in her father and mother.  ROS:  Please see the history of present illness.    All other systems are reviewed and otherwise negative.   PHYSICAL EXAM:  VS:  LMP  (LMP Unknown)  BMI: There is no height or weight on file to calculate BMI. Well nourished, well developed, in no acute distress HEENT: normocephalic, atraumatic Neck: no JVD, carotid bruits or masses Cardiac:  RRR; no significant murmurs, no rubs, or gallops Lungs:  CTA b/l, no wheezing, rhonchi or rales Abd: soft, nontender MS: no deformity, advanced atrophy Ext:  no edema Skin: warm and dry, no rash Neuro:  No gross deficits appreciated Psych: euthymic mood, full affect   PPM site is stable, no tethering or discomfort   EKG:  not done today  Device interrogation done today and reviewed by myself:  Battery is nearing ERI, approx 41mo Lead measurements are ok 100% AF burden Industry rep helped with an a;ert for minute ventilation sensor disabled This apparently 2.2 to her Afib No other programming changes were made    echo 06/22/21  1. Global hypokinesis with akinesis of the distal anteroseptal wall, apex  and distal inferior wall; overall severe LV dysfunction. LV function worse  compared to previous.   2. Left ventricular ejection fraction, by estimation, is 25 to 30%. The  left ventricle has severely decreased function. The left ventricle  demonstrates global hypokinesis. Left ventricular diastolic parameters are  indeterminate. The average left  ventricular global longitudinal strain is -7.0 %. The global longitudinal  strain is abnormal.   3. Right ventricular systolic function is normal. The right ventricular  size is mildly enlarged. There is mildly elevated pulmonary artery  systolic pressure.   4. Left atrial size was severely dilated.   5. Right atrial size was moderately dilated.   6. The mitral valve is normal in structure.  Mild mitral valve  regurgitation. No evidence of mitral stenosis.   7. Tricuspid valve regurgitation is moderate.   8. The aortic valve is tricuspid. Aortic valve regurgitation is moderate  to severe. Mild aortic valve sclerosis is present, with no evidence of  aortic valve stenosis.   9. The inferior vena cava is normal in size with greater than 50%  respiratory variability, suggesting right atrial pressure of 3 mmHg.     TTE 12/25/15 - Left ventricle: The cavity size was normal. Systolic function was   normal. The estimated ejection fraction was in  the range of 55%   to 60%. Wall motion was normal; there were no regional wall   motion abnormalities. The study is not technically sufficient to   allow evaluation of LV diastolic function. - Aortic valve: Transvalvular velocity was within the normal range.   There was no stenosis. There was mild to moderate regurgitation. - Mitral valve: There was no regurgitation. - Left atrium: The atrium was severely dilated. - Right ventricle: The cavity size was normal. Wall thickness was   normal. Systolic function was normal. - Right atrium: The atrium was severely dilated. - Tricuspid valve: There was mild regurgitation. - Pulmonary arteries: Systolic pressure was within the normal   range. PA peak pressure: 23 mm Hg (S).     Cardiac cath 12/26/15  Prox RCA to Dist RCA lesion, 30% stenosed. Prox LAD lesion, 40% stenosed. Mid LAD to Dist LAD lesion, 45% stenosed. Ost Cx to Prox Cx lesion, 10% stenosed. The lesion was previously treated with a drug-eluting stent greater than two years ago. The left ventricular systolic function is normal.     Recent Labs: 04/17/2021: B Natriuretic Peptide 186.3 06/23/2021: Magnesium 1.8 06/29/2021: ALT 7; BUN 11; Creatinine 0.65; Hemoglobin 10.0; Platelet Count 212; Potassium 3.4; Sodium 143; TSH 0.473  01/17/2021: Cholesterol 116; HDL 63.70; LDL Cholesterol 43; Total CHOL/HDL Ratio 2; Triglycerides 49.0; VLDL  9.8   CrCl cannot be calculated (Patient's most recent lab result is older than the maximum 21 days allowed.).   Wt Readings from Last 3 Encounters:  07/19/21 93 lb 9.6 oz (42.5 kg)  06/29/21 98 lb 4.8 oz (44.6 kg)  06/21/21 106 lb 0.7 oz (48.1 kg)     Other studies reviewed: Additional studies/records reviewed today include: summarized above  ASSESSMENT AND PLAN:  PPM Nearing ERI The patient's daughter says they would want to pursue gen change given fairly minimal procedure   Permanent AFib CHA2DS2Vasc is 6, No longer on a/c 2/2 some bleeding on her brain Will defer to her oncologist though given no plans for treatment tumors/bleeding associated with them remain  CAD No anginal sound complaints  HTN Now with relative hypotension  5, new severe CM Suspect 2/2 chemo She does not appear volume OL BP will limit management Her ARB has been stopped and her coreg reduced 2/2 low BPs since her discharge    Disposition: F/u with monthly remotes/battery check, and in clniic in 42mo, sooner if needed she should be near ERI  Current medicines are reviewed at length with the patient today.  The patient did not have any concerns regarding medicines.  Venetia Night, PA-C 08/18/2021 3:44 AM     [facility] [place] [address] [phone] (office)  [phone] (fax)

## 2021-08-22 ENCOUNTER — Ambulatory Visit: Payer: Self-pay

## 2021-08-23 ENCOUNTER — Inpatient Hospital Stay: Payer: Medicare Other

## 2021-08-23 ENCOUNTER — Inpatient Hospital Stay: Payer: Medicare Other | Admitting: Internal Medicine

## 2021-09-01 NOTE — Progress Notes (Signed)
Chief Complaint  Patient presents with   Hearing Problem    This has been going on for months.    HPI: RobinRobin [name] is a 86 y.o. female, who is here today with her daughter complaining of hearing loss, getting worse. Problem started a couple months before recent hospitalization,06/2021. Not proceeded by URI or travel. She has not noted ear ache or drainage. It is constant but sometimes she feels like it is better.  + Rhinorrhea, no nasal congestion or sore throat. Her daughter reports that sometimes Ms [name] does "hear voices." Problem has been going on since she was started on Dexamethasone.Medication has helped with appetite and energy.  She was last seen on 06/28/21. Since her last visit she has seen her cardiologist and oncologist. Small cell lung cancer with brain metastasis, recently referred to hospice.  BP today is low. Reports intermittent episodes of dizziness,lightheadedness, when she bend neck/head and when getting up. She is in a wheel chair today. No associated CP or diaphoresis. Atrial fib, not on anticoagulation due to brain bleeding. Sick sinus synd with pacemaker placement, planning on having battery replaced in a few month.  Home BP 110's/60's. Losartan was discontinued and Carvedilol was decreased to 12.5 mg daily.  Today she is c/o months of hearing loss. Started in both ears but now left seems to be getting worse.  Review of Systems  Constitutional:  Positive for activity change, appetite change and fatigue. Negative for chills and fever.  Respiratory:  Positive for cough. Negative for shortness of breath and wheezing.   Cardiovascular:  Negative for chest pain and palpitations.  Genitourinary:  Negative for decreased urine volume, dysuria and hematuria.  Rest see pertinent positives and negatives per HPI.  Current Outpatient Medications on File Prior to Visit  Medication Sig Dispense Refill   acetaminophen (TYLENOL) 500 MG tablet Take 500 mg by mouth  every 6 (six) hours as needed for headache.     carvedilol (COREG) 12.5 MG tablet Take 1 tablet (12.5 mg total) by mouth 2 (two) times daily with a meal. 180 tablet 3   dexamethasone (DECADRON) 2 MG tablet Take 2 mg by mouth daily.     dicyclomine (BENTYL) 10 MG capsule Take 1 capsule (10 mg total) by mouth 4 (four) times daily routinely for abdominal cramping. 120 capsule 1   feeding supplement (BOOST / RESOURCE BREEZE) LIQD Take 1 Container by mouth 3 (three) times daily between meals. (Patient not taking: Reported on 09/04/2021)     glucose blood (ONETOUCH VERIO) test strip Use to test 3-4 times daily. 300 strip 4   ipratropium-albuterol (DUONEB) 0.5-2.5 (3) MG/3ML SOLN Take 1.5 mLs by nebulization 3 (three) times daily as needed. 360 mL 1   levothyroxine (SYNTHROID) 112 MCG tablet TAKE 1 TABLET BY MOUTH DAILY BEFORE BREAKFAST. 90 tablet 2   Loperamide HCl (IMODIUM PO) Take by mouth as needed.     morphine 10 MG/5ML solution Take by mouth every 2 (two) hours as needed for severe pain. PRN     Multiple Vitamin (MULTIVITAMIN WITH MINERALS) TABS tablet Take 1 tablet by mouth daily.     nitroGLYCERIN (NITROSTAT) 0.4 MG SL tablet Place 1 tablet (0.4 mg total) under the tongue every 5 (five) minutes x 3 doses as needed for chest pain (Max 3 doses within 15 min. Call 911). 10 tablet 0   oxyCODONE-acetaminophen (PERCOCET) 10-325 MG tablet Take 0.5-1 tablets by mouth every 12 (twelve) hours as needed for pain. 30 tablet 0   pantoprazole (  PROTONIX) 40 MG tablet TAKE 1 TABLET BY MOUTH EVERY DAY (Patient taking differently: Take 40 mg by mouth daily.) 90 tablet 3   potassium chloride SA (KLOR-CON M20) 20 MEQ tablet Take 1 tablet (20 mEq total) by mouth daily.     prochlorperazine (COMPAZINE) 10 MG tablet Take 1 tablet (10 mg total) by mouth every 6 (six) hours as needed for nausea or vomiting. 30 tablet 1   Spacer/Aero-Holding Chambers (AEROCHAMBER PLUS) inhaler Use as instructed with inahaler. 1 each 1    linagliptin (TRADJENTA) 5 MG TABS tablet Take 1 tablet (5 mg total) by mouth daily. 90 tablet 1   No current facility-administered medications on file prior to visit.     Past Medical History:  Diagnosis Date   Anemia    CAD (coronary artery disease), native coronary artery    PTCA of distal right coronary artery 1997 Cypher stents to circumflex 2005 Cardiac cath in 2011 with patent stent to circumflex and moderate disease elsewhere treated medically    Cardiac pacemaker in situ 12/24/2015   Original implant reportedly in 1991 for tachybradycardia syndrome, generator change in 1999, 2004 and evidently again in 2016 in [place]    Chronic systolic CHF (congestive heart failure) (HCC)    COPD (chronic obstructive pulmonary disease) ([place])    Diabetes mellitus without complication ([place])    Hypertension    Hypothyroidism 03/23/2010   Lung cancer ([place])    Pacemaker    Permanent atrial fibrillation ([place]) 03/23/2010   Prolonged Q-T interval on ECG    Allergies  Allergen Reactions   Seasonal Ic [Cholestatin]     Itchy eye and runny nose    Social History   Socioeconomic History   Marital status: Widowed    Spouse name: Not on file   Number of children: Not on file   Years of education: Not on file   Highest education level: Not on file  Occupational History   Occupation: retired  Tobacco Use   Smoking status: Some Days    Packs/day: 0.25    Years: 66.00    Pack years: 16.50    Types: Cigarettes    Last attempt to quit: 12/27/2019    Years since quitting: 1.6   Smokeless tobacco: Never   Tobacco comments:    03/16/21: Per pt, she recently began smoking 2-3 cigs a week but plans to stop again..  Vaping Use   Vaping Use: Never used  Substance and Sexual Activity   Alcohol use: No    Alcohol/week: 0.0 standard drinks   Drug use: No   Sexual activity: Not on file  Other Topics Concern   Not on file  Social History Narrative   Not on file   Social Determinants of Health    Financial Resource Strain: Low Risk    Difficulty of Paying Living Expenses: Not hard at all  Food Insecurity: No Food Insecurity   Worried About Charity fundraiser in the Last Year: Never true   [place] in the Last Year: Never true  Transportation Needs: No Transportation Needs   Lack of Transportation (Medical): No   Lack of Transportation (Non-Medical): No  Physical Activity: Inactive   Days of Exercise per Week: 0 days   Minutes of Exercise per Session: 0 min  Stress: No Stress Concern Present   Feeling of Stress : Not at all  Social Connections: Socially Isolated   Frequency of Communication with Friends and Family: More than three times a  week   Frequency of Social Gatherings with Friends and Family: More than three times a week   Attends Religious Services: Never   Marine scientist or Organizations: No   Attends Archivist Meetings: Never   Marital Status: Widowed   Vitals:   09/04/21 1227  BP: (!) 90/42  Pulse: (!) 56  Resp: 16  Temp: 98.3 F (36.8 C)  SpO2: 97%   Body mass index is 18.74 kg/m.  Physical Exam Vitals and nursing note reviewed.  Constitutional:      General: She is not in acute distress.    Appearance: She is well-developed, well-groomed and underweight.  HENT:     Head: Normocephalic and atraumatic.     Right Ear: Ear canal and external ear normal. Decreased hearing noted. A middle ear effusion is present. Tympanic membrane is not erythematous.     Left Ear: Ear canal and external ear normal. Decreased hearing noted. A middle ear effusion is present. Tympanic membrane is not erythematous.  Eyes:     Conjunctiva/sclera: Conjunctivae normal.  Cardiovascular:     Rate and Rhythm: Normal rate. Rhythm irregular.     Heart sounds: No murmur heard. Pulmonary:     Effort: Pulmonary effort is normal. No respiratory distress.     Breath sounds: Normal breath sounds.  Skin:    General: Skin is warm.     Findings: No  erythema or rash.  Neurological:     General: No focal deficit present.     Mental Status: She is alert.     Cranial Nerves: No cranial nerve deficit.     Gait: Gait normal.     Comments: Oriented in place and person.  Psychiatric:     Comments: Well groomed, good eye contact.   ASSESSMENT AND PLAN:  RobinEsparanza was seen today for hearing problem.  Diagnoses and all orders for this visit:  Bilateral hearing loss, unspecified hearing loss type Abnormal hearing test. We discussed possible etiologies, combination of sensorineural and conductive. Auto inflation maneuvers a few times per day may help with Eustachian tube dysfunction.  Hearing Screening   500Hz  1000Hz  2000Hz  4000Hz   Right ear Fail Fail Pass Fail  Left ear Fail Fail Pass Fail   Hypotension, unspecified hypotension type Home BP's low 100's/60's. Increasing fluid intake recommended. Antihypertensive treatment has been recently adjusted by hospice team. No changes in Carvedilol for now but will need to be discontinue if BP does not improved.  Return if symptoms worsen or fail to improve.  Ukiah Trawick G. [name], MD  [facility]. [place] office.

## 2021-09-04 ENCOUNTER — Ambulatory Visit (INDEPENDENT_AMBULATORY_CARE_PROVIDER_SITE_OTHER): Payer: Medicare Other

## 2021-09-04 ENCOUNTER — Ambulatory Visit (INDEPENDENT_AMBULATORY_CARE_PROVIDER_SITE_OTHER): Payer: Medicare Other | Admitting: Family Medicine

## 2021-09-04 ENCOUNTER — Encounter: Payer: Self-pay | Admitting: Family Medicine

## 2021-09-04 VITALS — BP 90/42 | Ht 61.0 in | Wt 99.0 lb

## 2021-09-04 VITALS — BP 90/42 | HR 56 | Temp 98.3°F | Resp 16 | Ht 61.0 in | Wt 99.2 lb

## 2021-09-04 DIAGNOSIS — I959 Hypotension, unspecified: Secondary | ICD-10-CM | POA: Diagnosis not present

## 2021-09-04 DIAGNOSIS — Z0000 Encounter for general adult medical examination without abnormal findings: Secondary | ICD-10-CM

## 2021-09-04 DIAGNOSIS — H9193 Unspecified hearing loss, bilateral: Secondary | ICD-10-CM | POA: Diagnosis not present

## 2021-09-04 NOTE — Patient Instructions (Addendum)
A few things to remember from today's visit:  Bilateral hearing loss, unspecified hearing loss type  Hypotension, unspecified hypotension type  If you need refills please call your pharmacy. Do not use My Chart to request refills or for acute issues that need immediate attention.   Adequate hydration. Popping the ears a few times during the day may help. Try to increase oral intake.  Please be sure medication list is accurate. If a new problem present, please set up appointment sooner than planned today.

## 2021-09-04 NOTE — Progress Notes (Signed)
Subjective:   [name] is a 86 y.o. female who presents for Medicare Annual (Subsequent) preventive examination.  Review of Systems    No ROS Cardiac Risk Factors include: advanced age (>37men, >58 women);diabetes mellitus;hypertension     Objective:    Today's Vitals   09/04/21 1317  BP: (!) 90/42  Weight: 99 lb (44.9 kg)  Height: 5\' 1"  (1.549 m)   Body mass index is 18.71 kg/m.  Advanced Directives 09/04/2021 07/19/2021 06/29/2021 06/21/2021 06/01/2021 05/12/2021 05/04/2021  Does Patient Have a Medical Advance Directive? Yes Yes Yes Yes Yes Yes Yes  Type of Paramedic of [place];Living will Living will;Healthcare Power of [place];Living will Out of facility DNR (pink MOST or yellow form) [facility];Living will [place];Living will [place];Living will  Does patient want to make changes to medical advance directive? No - Patient declined No - Patient declined No - Patient declined No - Guardian declined - - No - Patient declined  Copy of [place] in Chart? No - copy requested - Yes - validated most recent copy scanned in chart (See row information) - Yes - validated most recent copy scanned in chart (See row information) Yes - validated most recent copy scanned in chart (See row information) Yes - validated most recent copy scanned in chart (See row information)  Would patient like information on creating a medical advance directive? - - No - Patient declined - - - -    Current Medications (verified) Outpatient Encounter Medications as of 09/04/2021  Medication Sig   acetaminophen (TYLENOL) 500 MG tablet Take 500 mg by mouth every 6 (six) hours as needed for headache.   carvedilol (COREG) 12.5 MG tablet Take 1 tablet (12.5 mg total) by mouth 2 (two) times daily with a meal.   dexamethasone (DECADRON) 2 MG tablet Take 2 mg by mouth daily.   dicyclomine  (BENTYL) 10 MG capsule Take 1 capsule (10 mg total) by mouth 4 (four) times daily routinely for abdominal cramping.   glucose blood (ONETOUCH VERIO) test strip Use to test 3-4 times daily.   ipratropium-albuterol (DUONEB) 0.5-2.5 (3) MG/3ML SOLN Take 1.5 mLs by nebulization 3 (three) times daily as needed.   levothyroxine (SYNTHROID) 112 MCG tablet TAKE 1 TABLET BY MOUTH DAILY BEFORE BREAKFAST.   linagliptin (TRADJENTA) 5 MG TABS tablet Take 1 tablet (5 mg total) by mouth daily.   Loperamide HCl (IMODIUM PO) Take by mouth as needed.   morphine 10 MG/5ML solution Take by mouth every 2 (two) hours as needed for severe pain. PRN   Multiple Vitamin (MULTIVITAMIN WITH MINERALS) TABS tablet Take 1 tablet by mouth daily.   nitroGLYCERIN (NITROSTAT) 0.4 MG SL tablet Place 1 tablet (0.4 mg total) under the tongue every 5 (five) minutes x 3 doses as needed for chest pain (Max 3 doses within 15 min. Call 911).   oxyCODONE-acetaminophen (PERCOCET) 10-325 MG tablet Take 0.5-1 tablets by mouth every 12 (twelve) hours as needed for pain.   pantoprazole (PROTONIX) 40 MG tablet TAKE 1 TABLET BY MOUTH EVERY DAY (Patient taking differently: Take 40 mg by mouth daily.)   potassium chloride SA (KLOR-CON M20) 20 MEQ tablet Take 1 tablet (20 mEq total) by mouth daily.   prochlorperazine (COMPAZINE) 10 MG tablet Take 1 tablet (10 mg total) by mouth every 6 (six) hours as needed for nausea or vomiting.   Spacer/Aero-Holding Chambers (AEROCHAMBER PLUS) inhaler Use as instructed  with inahaler.   feeding supplement (BOOST / RESOURCE BREEZE) LIQD Take 1 Container by mouth 3 (three) times daily between meals. (Patient not taking: Reported on 09/04/2021)   [DISCONTINUED] atorvastatin (LIPITOR) 10 MG tablet TAKE 1 TABLET BY MOUTH EVERY DAY (Patient not taking: Reported on 09/04/2021)   No facility-administered encounter medications on file as of 09/04/2021.    Allergies (verified) Seasonal ic [cholestatin]   History: Past  Medical History:  Diagnosis Date   Anemia    CAD (coronary artery disease), native coronary artery    PTCA of distal right coronary artery 1997 Cypher stents to circumflex 2005 Cardiac cath in 2011 with patent stent to circumflex and moderate disease elsewhere treated medically    Cardiac pacemaker in situ 12/24/2015   Original implant reportedly in 1991 for tachybradycardia syndrome, generator change in 1999, 2004 and evidently again in 2016 in [place]    Chronic systolic CHF (congestive heart failure) (HCC)    COPD (chronic obstructive pulmonary disease) ([place])    Diabetes mellitus without complication ([place])    Hypertension    Hypothyroidism 03/23/2010   Lung cancer ([place])    Pacemaker    Permanent atrial fibrillation ([place]) 03/23/2010   Prolonged Q-T interval on ECG    Past Surgical History:  Procedure Laterality Date   BIOPSY  01/08/2020   Procedure: BIOPSY;  Surgeon: Garner Nash, DO;  Location: [facility];  Service: Pulmonary;;   BRONCHIAL BRUSHINGS  01/08/2020   Procedure: BRONCHIAL BRUSHINGS;  Surgeon: Garner Nash, DO;  Location: [facility];  Service: Pulmonary;;   BRONCHIAL WASHINGS  01/08/2020   Procedure: BRONCHIAL WASHINGS;  Surgeon: Garner Nash, DO;  Location: [facility];  Service: Pulmonary;;   CARDIAC CATHETERIZATION N/A 12/26/2015   Procedure: Left Heart Cath and Coronary Angiography;  Surgeon: Peter M [name], MD;  Location: [facility];  Service: Cardiovascular;  Laterality: N/A;   CARDIOVERSION  05/2015   CHOLECYSTECTOMY  2012   CORONARY ANGIOPLASTY WITH STENT PLACEMENT  1990   ENDOBRONCHIAL ULTRASOUND  01/08/2020   Procedure: ENDOBRONCHIAL ULTRASOUND;  Surgeon: Garner Nash, DO;  Location: [facility];  Service: Pulmonary;;   FINE NEEDLE ASPIRATION BIOPSY  01/08/2020   Procedure: FINE NEEDLE ASPIRATION BIOPSY;  Surgeon: Garner Nash, DO;  Location: [facility];  Service: Pulmonary;;   HERNIA REPAIR     IR IMAGING GUIDED PORT INSERTION   02/18/2020   PACEMAKER GENERATOR CHANGE  [id number],[id number], 2016   PACEMAKER INSERTION  1991   VIDEO BRONCHOSCOPY WITH ENDOBRONCHIAL ULTRASOUND N/A 01/08/2020   Procedure: VIDEO BRONCHOSCOPY;  Surgeon: Garner Nash, DO;  Location: [facility];  Service: Pulmonary;  Laterality: N/A;   Family History  Problem Relation Age of Onset   Heart disease Father    Heart disease Mother    Heart attack Mother    Cirrhosis Brother    Colon cancer Neg Hx    Stomach cancer Neg Hx    Social History   Socioeconomic History   Marital status: Widowed    Spouse name: Not on file   Number of children: Not on file   Years of education: Not on file   Highest education level: Not on file  Occupational History   Occupation: retired  Tobacco Use   Smoking status: Some Days    Packs/day: 0.25    Years: 66.00    Pack years: 16.50    Types: Cigarettes    Last attempt to quit: 12/27/2019    Years since quitting: 1.6  Smokeless tobacco: Never   Tobacco comments:    03/16/21: Per pt, she recently began smoking 2-3 cigs a week but plans to stop again..  Vaping Use   Vaping Use: Never used  Substance and Sexual Activity   Alcohol use: No    Alcohol/week: 0.0 standard drinks   Drug use: No   Sexual activity: Not on file  Other Topics Concern   Not on file  Social History Narrative   Not on file   Social Determinants of Health   Financial Resource Strain: Low Risk    Difficulty of Paying Living Expenses: Not hard at all  Food Insecurity: No Food Insecurity   Worried About Charity fundraiser in the Last Year: Never true   [place] in the Last Year: Never true  Transportation Needs: No Transportation Needs   Lack of Transportation (Medical): No   Lack of Transportation (Non-Medical): No  Physical Activity: Inactive   Days of Exercise per Week: 0 days   Minutes of Exercise per Session: 0 min  Stress: No Stress Concern Present   Feeling of Stress : Not at all  Social Connections: Socially  Isolated   Frequency of Communication with Friends and Family: More than three times a week   Frequency of Social Gatherings with Friends and Family: More than three times a week   Attends Religious Services: Never   Marine scientist or Organizations: No   Attends Archivist Meetings: Never   Marital Status: Widowed    Clinical Intake:  Nutrition Risk Assessment:  Has the patient had any N/V/D within the last 2 months?  No  Does the patient have any non-healing wounds?  No  Has the patient had any unintentional weight loss or weight gain?  No   Diabetes:  Is the patient diabetic?  Yes  If diabetic, was a CBG obtained today?  No  Did the patient bring in their glucometer from home?  No   Financial Strains and Diabetes Management:  Are you having any financial strains with the device, your supplies or your medication? No .  Does the patient want to be seen by Chronic Care Management for management of their diabetes?  No  Would the patient like to be referred to a Nutritionist or for Diabetic Management?  No Followed by PCP  Diabetic Exams:  Diabetic Eye Exam: Completed No. Overdue for diabetic eye exam. Pt has been advised about the importance in completing this exam.   Diabetic Foot Exam: Completed No  Patient deferred Followed by PCP.  Pre-visit preparation completed: Yes   Diabetes: Yes CBG done?: No Did pt. bring in CBG monitor from home?: No  How often do you need to have someone help you when you read instructions, pamphlets, or other written materials from your doctor or pharmacy?: 5 - Always (Daughter assist)  Diabetic? Yes  Interpreter Needed?: No      Activities of Daily Living In your present state of health, do you have any difficulty performing the following activities: 09/04/2021 06/21/2021  Hearing? N Y  Vision? N N  Comment Wears glasses has cataracts in both eyes  Difficulty concentrating or making decisions? Y Y  Comment Daughter  assist -  Walking or climbing stairs? Y Y  Comment Wheelchair dependant -  Dressing or bathing? Tempie Donning  Comment Daughter assist -  Doing errands, shopping? N Y  Comment Daughter assist -  Conservation officer, nature and eating ? Y -  Comment  Daughter assist -  Using the Toilet? N -  In the past six months, have you accidently leaked urine? Y -  Comment Wears pads and breifs -  Do you have problems with loss of bowel control? Y -  Comment Wears breifs -  Managing your Medications? N -  Comment Daughter assist -  Managing your Finances? N -  Comment Daughter assist -  Some recent data might be hidden    Patient Care Team: [name], [name], MD as PCP - General (Family Medicine) Constance Haw, MD as PCP - Electrophysiology (Cardiology) [name], MD as PCP - Cardiology (Cardiology) Viona Gilmore, [facility] as Pharmacist (Pharmacist)  Indicate any recent Medical Services you may have received from other than [facility] providers in the past year (date may be approximate).     Assessment:   This is a routine wellness examination for Frannie.  Hearing/Vision screen Hearing Screening - Comments:: No difficulty hearing Vision Screening - Comments:: Wears glasses. Followed by [facility]  Dietary issues and exercise activities discussed:     Goals Addressed             This Visit's Progress    Patient Stated       None at this time.       Depression Screen PHQ 2/9 Scores 09/04/2021 08/16/2020 10/02/2019 10/09/2017  PHQ - 2 Score 0 0 0 0    Fall Risk Fall Risk  09/04/2021 08/16/2020 04/30/2020  Falls in the past year? 1 0 0  Comment Patient fell without injury or medical attention - -  Number falls in past yr: 0 0 0  Injury with Fall? 0 0 0  Risk for fall due to : - Impaired vision;Impaired balance/gait;Impaired mobility -  Follow up - Falls prevention discussed Education provided    FALL RISK PREVENTION PERTAINING TO THE HOME:  Any stairs in or around the home? No   If so, are there any without handrails? No  Home free of loose throw rugs in walkways, pet beds, electrical cords, etc? Yes  Adequate lighting in your home to reduce risk of falls? Yes   ASSISTIVE DEVICES UTILIZED TO PREVENT FALLS:  Life alert? No  Use of a cane, walker or w/c? Yes  Grab bars in the bathroom? No  Shower chair or bench in shower? Yes  Elevated toilet seat or a handicapped toilet? No   TIMED UP AND GO:  Was the test performed? No . Wheelchair dependant  Cognitive Function:    Immunizations Immunization History  Administered Date(s) Administered   Fluad Quad(high Dose 65+) 10/05/2020   Influenza, High Dose Seasonal PF 07/31/2017, 07/24/2018   Influenza-Unspecified 07/31/2017   Pneumococcal Polysaccharide-23 04/27/2020      Flu Vaccine status: Due, Education has been provided regarding the importance of this vaccine. Advised may receive this vaccine at local pharmacy or Health Dept. Aware to provide a copy of the vaccination record if obtained from local pharmacy or Health Dept. Verbalized acceptance and understanding.  Pneumococcal vaccine status: Due, Education has been provided regarding the importance of this vaccine. Advised may receive this vaccine at local pharmacy or Health Dept. Aware to provide a copy of the vaccination record if obtained from local pharmacy or Health Dept. Verbalized acceptance and understanding.  Qualifies for Shingles Vaccine? Yes   Zostavax completed No   Shingrix Completed?: No.    Education has been provided regarding the importance of this vaccine. Patient has been advised to call insurance  company to determine out of pocket expense if they have not yet received this vaccine. Advised may also receive vaccine at local pharmacy or Health Dept. Verbalized acceptance and understanding.  Screening Tests Health Maintenance  Topic Date Due   OPHTHALMOLOGY EXAM  Never done   DEXA SCAN  Never done   FOOT EXAM  04/27/2021   URINE  MICROALBUMIN  10/05/2021 (Originally 06/09/2020)   Zoster Vaccines- Shingrix (1 of 2) 12/03/2021 (Originally 06/21/1954)   INFLUENZA VACCINE  03/30/2022 (Originally 05/01/2021)   Pneumonia Vaccine 84+ Years old (2 - PCV) 09/04/2022 (Originally 04/27/2021)   HEMOGLOBIN A1C  12/20/2021   HPV VACCINES  Aged Out   TETANUS/TDAP  Discontinued   COVID-19 Vaccine  Discontinued    Health Maintenance  Health Maintenance Due  Topic Date Due   OPHTHALMOLOGY EXAM  Never done   DEXA SCAN  Never done   FOOT EXAM  04/27/2021    Vision Screening: Recommended annual ophthalmology exams for early detection of glaucoma and other disorders of the eye. Is the patient up to date with their annual eye exam?  No  Who is the provider or what is the name of the office in which the patient attends annual eye exams? Followed by [place] Screening: Recommended annual dental exams for proper oral hygiene  Community Resource Referral / Chronic Care Management:  CRR required this visit?  No   CCM required this visit?  No      Plan:     I have personally reviewed and noted the following in the patient's chart:   Medical and social history Use of alcohol, tobacco or illicit drugs  Current medications and supplements including opioid prescriptions. Patient currently taking opioids Functional ability and status Nutritional status Physical activity Advanced directives List of other physicians Hospitalizations, surgeries, and ER visits in previous 12 months Vitals Screenings to include cognitive, depression, and falls Referrals and appointments  In addition, I have reviewed and discussed with patient certain preventive protocols, quality metrics, and best practice recommendations. A written personalized care plan for preventive services as well as general preventive health recommendations were provided to patient.     Criselda Peaches, LPN   09/01/6243

## 2021-09-04 NOTE — Patient Instructions (Addendum)
[name] , Thank you for taking time to come for your Medicare Wellness Visit. I appreciate your ongoing commitment to your health goals. Please review the following plan we discussed and let me know if I can assist you in the future.   These are the goals we discussed:  Goals      Patient Stated     None at this time.        This is a list of the screening recommended for you and due dates:  Health Maintenance  Topic Date Due   Eye exam for diabetics  Never done   DEXA scan (bone density measurement)  Never done   Complete foot exam   04/27/2021   Urine Protein Check  10/05/2021*   Zoster (Shingles) Vaccine (1 of 2) 12/03/2021*   Flu Shot  03/30/2022*   Pneumonia Vaccine (2 - PCV) 09/04/2022*   Hemoglobin A1C  12/20/2021   HPV Vaccine  Aged Out   Tetanus Vaccine  Discontinued   COVID-19 Vaccine  Discontinued  *Topic was postponed. The date shown is not the original due date.     Advanced directives: Yes  Conditions/risks identified: None  Next appointment: Follow up in one year for your annual wellness visit    Preventive Care 65 Years and Older, Female Preventive care refers to lifestyle choices and visits with your health care provider that can promote health and wellness. What does preventive care include? A yearly physical exam. This is also called an annual well check. Dental exams once or twice a year. Routine eye exams. Ask your health care provider how often you should have your eyes checked. Personal lifestyle choices, including: Daily care of your teeth and gums. Regular physical activity. Eating a healthy diet. Avoiding tobacco and drug use. Limiting alcohol use. Practicing safe sex. Taking low-dose aspirin every day. Taking vitamin and mineral supplements as recommended by your health care provider. What happens during an annual well check? The services and screenings done by your health care provider during your annual well check will depend on  your age, overall health, lifestyle risk factors, and family history of disease. Counseling  Your health care provider may ask you questions about your: Alcohol use. Tobacco use. Drug use. Emotional well-being. Home and relationship well-being. Sexual activity. Eating habits. History of falls. Memory and ability to understand (cognition). Work and work Statistician. Reproductive health. Screening  You may have the following tests or measurements: Height, weight, and BMI. Blood pressure. Lipid and cholesterol levels. These may be checked every 5 years, or more frequently if you are over 5 years old. Skin check. Lung cancer screening. You may have this screening every year starting at age 82 if you have a 30-pack-year history of smoking and currently smoke or have quit within the past 15 years. Fecal occult blood test (FOBT) of the stool. You may have this test every year starting at age 81. Flexible sigmoidoscopy or colonoscopy. You may have a sigmoidoscopy every 5 years or a colonoscopy every 10 years starting at age 23. Hepatitis C blood test. Hepatitis B blood test. Sexually transmitted disease (STD) testing. Diabetes screening. This is done by checking your blood sugar (glucose) after you have not eaten for a while (fasting). You may have this done every 1-3 years. Bone density scan. This is done to screen for osteoporosis. You may have this done starting at age 79. Mammogram. This may be done every 1-2 years. Talk to your health care provider about how often  you should have regular mammograms. Talk with your health care provider about your test results, treatment options, and if necessary, the need for more tests. Vaccines  Your health care provider may recommend certain vaccines, such as: Influenza vaccine. This is recommended every year. Tetanus, diphtheria, and acellular pertussis (Tdap, Td) vaccine. You may need a Td booster every 10 years. Zoster vaccine. You may need this  after age 62. Pneumococcal 13-valent conjugate (PCV13) vaccine. One dose is recommended after age 79. Pneumococcal polysaccharide (PPSV23) vaccine. One dose is recommended after age 28. Talk to your health care provider about which screenings and vaccines you need and how often you need them. This information is not intended to replace advice given to you by your health care provider. Make sure you discuss any questions you have with your health care provider. Document Released: 10/14/2015 Document Revised: 06/06/2016 Document Reviewed: 07/19/2015 Elsevier Interactive Patient Education  2017 [place] Prevention in the Home Falls can cause injuries. They can happen to people of all ages. There are many things you can do to make your home safe and to help prevent falls. What can I do on the outside of my home? Regularly fix the edges of walkways and driveways and fix any cracks. Remove anything that might make you trip as you walk through a door, such as a raised step or threshold. Trim any bushes or trees on the path to your home. Use bright outdoor lighting. Clear any walking paths of anything that might make someone trip, such as rocks or tools. Regularly check to see if handrails are loose or broken. Make sure that both sides of any steps have handrails. Any raised decks and porches should have guardrails on the edges. Have any leaves, snow, or ice cleared regularly. Use sand or salt on walking paths during winter. Clean up any spills in your garage right away. This includes oil or grease spills. What can I do in the bathroom? Use night lights. Install grab bars by the toilet and in the tub and shower. Do not use towel bars as grab bars. Use non-skid mats or decals in the tub or shower. If you need to sit down in the shower, use a plastic, non-slip stool. Keep the floor dry. Clean up any water that spills on the floor as soon as it happens. Remove soap buildup in the tub or  shower regularly. Attach bath mats securely with double-sided non-slip rug tape. Do not have throw rugs and other things on the floor that can make you trip. What can I do in the bedroom? Use night lights. Make sure that you have a light by your bed that is easy to reach. Do not use any sheets or blankets that are too big for your bed. They should not hang down onto the floor. Have a firm chair that has side arms. You can use this for support while you get dressed. Do not have throw rugs and other things on the floor that can make you trip. What can I do in the kitchen? Clean up any spills right away. Avoid walking on wet floors. Keep items that you use a lot in easy-to-reach places. If you need to reach something above you, use a strong step stool that has a grab bar. Keep electrical cords out of the way. Do not use floor polish or wax that makes floors slippery. If you must use wax, use non-skid floor wax. Do not have throw rugs and other things on  the floor that can make you trip. What can I do with my stairs? Do not leave any items on the stairs. Make sure that there are handrails on both sides of the stairs and use them. Fix handrails that are broken or loose. Make sure that handrails are as long as the stairways. Check any carpeting to make sure that it is firmly attached to the stairs. Fix any carpet that is loose or worn. Avoid having throw rugs at the top or bottom of the stairs. If you do have throw rugs, attach them to the floor with carpet tape. Make sure that you have a light switch at the top of the stairs and the bottom of the stairs. If you do not have them, ask someone to add them for you. What else can I do to help prevent falls? Wear shoes that: Do not have high heels. Have rubber bottoms. Are comfortable and fit you well. Are closed at the toe. Do not wear sandals. If you use a stepladder: Make sure that it is fully opened. Do not climb a closed stepladder. Make  sure that both sides of the stepladder are locked into place. Ask someone to hold it for you, if possible. Clearly mark and make sure that you can see: Any grab bars or handrails. First and last steps. Where the edge of each step is. Use tools that help you move around (mobility aids) if they are needed. These include: Canes. Walkers. Scooters. Crutches. Turn on the lights when you go into a dark area. Replace any light bulbs as soon as they burn out. Set up your furniture so you have a clear path. Avoid moving your furniture around. If any of your floors are uneven, fix them. If there are any pets around you, be aware of where they are. Review your medicines with your doctor. Some medicines can make you feel dizzy. This can increase your chance of falling. Ask your doctor what other things that you can do to help prevent falls. This information is not intended to replace advice given to you by your health care provider. Make sure you discuss any questions you have with your health care provider. Document Released: 07/14/2009 Document Revised: 02/23/2016 Document Reviewed: 10/22/2014 Elsevier Interactive Patient Education  2017 [place]. Opioid Pain Medicine Management Opioids are powerful medicines that are used to treat moderate to severe pain. When used for short periods of time, they can help you to: Sleep better. Do better in physical or occupational therapy. Feel better in the first few days after an injury. Recover from surgery. Opioids should be taken with the supervision of a trained health care provider. They should be taken for the shortest period of time possible. This is because opioids can be addictive, and the longer you take opioids, the greater your risk of addiction. This addiction can also be called opioid use disorder. What are the risks? Using opioid pain medicines for longer than 3 days increases your risk of side effects. Side effects  include: Constipation. Nausea and vomiting. Breathing difficulties (respiratory depression). Drowsiness. Confusion. Opioid use disorder. Itching. Taking opioid pain medicine for a long period of time can affect your ability to do daily tasks. It also puts you at risk for: Motor vehicle crashes. Depression. Suicide. Heart attack. Overdose, which can be life-threatening. What is a pain treatment plan? A pain treatment plan is an agreement between you and your health care provider. Pain is unique to each person, and treatments vary depending on  your condition. To manage your pain, you and your health care provider need to work together. To help you do this: Discuss the goals of your treatment, including how much pain you might expect to have and how you will manage the pain. Review the risks and benefits of taking opioid medicines. Remember that a good treatment plan uses more than one approach and minimizes the chance of side effects. Be honest about the amount of medicines you take and about any drug or alcohol use. Get pain medicine prescriptions from only one health care provider. Pain can be managed with many types of alternative treatments. Ask your health care provider to refer you to one or more specialists who can help you manage pain through: Physical or occupational therapy. Counseling (cognitive behavioral therapy). Good nutrition. Biofeedback. Massage. Meditation. Non-opioid medicine. Following a gentle exercise program. How to use opioid pain medicine Taking medicine Take your pain medicine exactly as told by your health care provider. Take it only when you need it. If your pain gets less severe, you may take less than your prescribed dose if your health care provider approves. If you are not having pain, do nottake pain medicine unless your health care provider tells you to take it. If your pain is severe, do nottry to treat it yourself by taking more pills than  instructed on your prescription. Contact your health care provider for help. Write down the times when you take your pain medicine. It is easy to become confused while on pain medicine. Writing the time can help you avoid overdose. Take other over-the-counter or prescription medicines only as told by your health care provider. Keeping yourself and others safe  While you are taking opioid pain medicine: Do not drive, use machinery, or power tools. Do not sign legal documents. Do not drink alcohol. Do not take sleeping pills. Do not supervise children by yourself. Do not do activities that require climbing or being in high places. Do not go to a lake, river, ocean, spa, or swimming pool. Do not share your pain medicine with anyone. Keep pain medicine in a locked cabinet or in a secure area where pets and children cannot reach it. Stopping your use of opioids If you have been taking opioid medicine for more than a few weeks, you may need to slowly decrease (taper) how much you take until you stop completely. Tapering your use of opioids can decrease your risk of symptoms of withdrawal, such as: Pain and cramping in the abdomen. Nausea. Sweating. Sleepiness. Restlessness. Uncontrollable shaking (tremors). Cravings for the medicine. Do not attempt to taper your use of opioids on your own. Talk with your health care provider about how to do this. Your health care provider may prescribe a step-down schedule based on how much medicine you are taking and how long you have been taking it. Getting rid of leftover pills Do not save any leftover pills. Get rid of leftover pills safely by: Taking the medicine to a prescription take-back program. This is usually offered by the county or law enforcement. Bringing them to a pharmacy that has a drug disposal container. Flushing them down the toilet. Check the label or package insert of your medicine to see whether this is safe to do. Throwing them out in  the trash. Check the label or package insert of your medicine to see whether this is safe to do. If it is safe to throw it out, remove the medicine from the original container, put it into a  sealable bag or container, and mix it with used coffee grounds, food scraps, dirt, or cat litter before putting it in the trash. Follow these instructions at home: Activity Do exercises as told by your health care provider. Avoid activities that make your pain worse. Return to your normal activities as told by your health care provider. Ask your health care provider what activities are safe for you. General instructions You may need to take these actions to prevent or treat constipation: Drink enough fluid to keep your urine pale yellow. Take over-the-counter or prescription medicines. Eat foods that are high in fiber, such as beans, whole grains, and fresh fruits and vegetables. Limit foods that are high in fat and processed sugars, such as fried or sweet foods. Keep all follow-up visits. This is important. Where to find support If you have been taking opioids for a long time, you may benefit from receiving support for quitting from a local support group or counselor. Ask your health care provider for a referral to these resources in your area. Where to find more information Centers for Disease Control and Prevention (CDC): http://www.wolf.info/ [place] Food and Drug Administration (FDA): [employer] Get help right away if: You may have taken too much of an opioid (overdosed). Common symptoms of an overdose: Your breathing is slower or more shallow than normal. You have a very slow heartbeat (pulse). You have slurred speech. You have nausea and vomiting. Your pupils become very small. You have other potential symptoms: You are very confused. You faint or feel like you will faint. You have cold, clammy skin. You have blue lips or fingernails. You have thoughts of harming yourself or harming others. These  symptoms may represent a serious problem that is an emergency. Do not wait to see if the symptoms will go away. Get medical help right away. Call your local emergency services (911 in the [place] Do not drive yourself to the hospital.  If you ever feel like you may hurt yourself or others, or have thoughts about taking your own life, get help right away. Go to your nearest emergency department or: Call your local emergency services (911 in the [place] Call the [facility] 814-708-4589 in the [place] Call a suicide crisis helpline, such as the [facility] at (407)801-7774 or 988 in the [place]. This is open 24 hours a day in the [place] Text the Crisis Text Line at 814-734-1367 (in the [place].). Summary Opioid medicines can help you manage moderate to severe pain for a short period of time. A pain treatment plan is an agreement between you and your health care provider. Discuss the goals of your treatment, including how much pain you might expect to have and how you will manage the pain. If you think that you or someone else may have taken too much of an opioid, get medical help right away. This information is not intended to replace advice given to you by your health care provider. Make sure you discuss any questions you have with your health care provider. Document Revised: 04/12/2021 Document Reviewed: 12/28/2020 Elsevier Patient Education  [place].

## 2021-09-07 NOTE — ED Provider Notes (Signed)
[facility] Provider Note   CSN: [id number] Arrival date & time: 06/21/21  1151     History Chief Complaint  Patient presents with   Cough    [name] is a 86 y.o. female w/ hx of lung and brain cancer presenting from home with generalized weakness and productive cough, in company of her daughter.  Symptoms for 2-3 days.  Covid (+).  HPI     Past Medical History:  Diagnosis Date   Anemia    CAD (coronary artery disease), native coronary artery    PTCA of distal right coronary artery 1997 Cypher stents to circumflex 2005 Cardiac cath in 2011 with patent stent to circumflex and moderate disease elsewhere treated medically    Cardiac pacemaker in situ 12/24/2015   Original implant reportedly in 1991 for tachybradycardia syndrome, generator change in 1999, 2004 and evidently again in 2016 in [place]    Chronic systolic CHF (congestive heart failure) (HCC)    COPD (chronic obstructive pulmonary disease) ([place])    Diabetes mellitus without complication ([place])    Hypertension    Hypothyroidism 03/23/2010   Lung cancer ([place])    Pacemaker    Permanent atrial fibrillation ([place]) 03/23/2010   Prolonged Q-T interval on ECG     Patient Active Problem List   Diagnosis Date Noted   QT prolongation 06/23/2021   Hypokalemia 06/21/2021   Secondary hypercoagulable state ([place]) 04/26/2021   Metastatic cancer to brain ([place]) 03/16/2021   Epigastric abdominal pain 04/27/2020   Port-A-Cath in place 04/11/2020   Small cell lung cancer, right ([place]) 01/28/2020   Encounter for antineoplastic chemotherapy 01/28/2020   Encounter for antineoplastic immunotherapy 01/28/2020   Goals of care, counseling/discussion 01/28/2020   Near syncope 01/05/2020   Mediastinal lymphadenopathy 01/01/2020   Multiple lung nodules on CT 01/01/2020   Atherosclerosis of aorta ([place]) 12/28/2019   Chronic diarrhea 12/15/2019   Hyperlipidemia associated with type 2 diabetes  mellitus ([place]) 06/10/2019   Chronic rhinitis 02/28/2018   Tobacco use disorder 05/10/2017   Hypertension with heart disease 05/10/2017   Chest pain 03/10/2017   Acute thoracic back pain    Cardiac pacemaker in situ 12/24/2015   History of Graves' disease 12/24/2015   Current use of long term anticoagulation 12/24/2015   Type 2 diabetes mellitus with other specified complication ([place])    Hypothyroidism 03/23/2010   Paroxysmal atrial fibrillation ([place]) 03/23/2010   COPD (chronic obstructive pulmonary disease) ([place]) 03/23/2010   CAD (coronary artery disease), native coronary artery    GERD     Past Surgical History:  Procedure Laterality Date   BIOPSY  01/08/2020   Procedure: BIOPSY;  Surgeon: Garner Nash, DO;  Location: [facility];  Service: Pulmonary;;   BRONCHIAL BRUSHINGS  01/08/2020   Procedure: BRONCHIAL BRUSHINGS;  Surgeon: Garner Nash, DO;  Location: [facility];  Service: Pulmonary;;   BRONCHIAL WASHINGS  01/08/2020   Procedure: BRONCHIAL WASHINGS;  Surgeon: Garner Nash, DO;  Location: [facility];  Service: Pulmonary;;   CARDIAC CATHETERIZATION N/A 12/26/2015   Procedure: Left Heart Cath and Coronary Angiography;  Surgeon: Peter M [name], MD;  Location: [facility];  Service: Cardiovascular;  Laterality: N/A;   CARDIOVERSION  05/2015   CHOLECYSTECTOMY  2012   CORONARY ANGIOPLASTY WITH STENT PLACEMENT  1990   ENDOBRONCHIAL ULTRASOUND  01/08/2020   Procedure: ENDOBRONCHIAL ULTRASOUND;  Surgeon: Garner Nash, DO;  Location: [facility];  Service: Pulmonary;;   FINE NEEDLE  ASPIRATION BIOPSY  01/08/2020   Procedure: FINE NEEDLE ASPIRATION BIOPSY;  Surgeon: Garner Nash, DO;  Location: [facility];  Service: Pulmonary;;   HERNIA REPAIR     IR IMAGING GUIDED PORT INSERTION  02/18/2020   PACEMAKER GENERATOR CHANGE  8315,1761, 2016   PACEMAKER INSERTION  1991   VIDEO BRONCHOSCOPY WITH ENDOBRONCHIAL ULTRASOUND N/A 01/08/2020   Procedure: VIDEO BRONCHOSCOPY;   Surgeon: Garner Nash, DO;  Location: [facility];  Service: Pulmonary;  Laterality: N/A;     OB History   No obstetric history on file.     Family History  Problem Relation Age of Onset   Heart disease Father    Heart disease Mother    Heart attack Mother    Cirrhosis Brother    Colon cancer Neg Hx    Stomach cancer Neg Hx     Social History   Tobacco Use   Smoking status: Some Days    Packs/day: 0.25    Years: 66.00    Pack years: 16.50    Types: Cigarettes    Last attempt to quit: 12/27/2019    Years since quitting: 1.6   Smokeless tobacco: Never   Tobacco comments:    03/16/21: Per pt, she recently began smoking 2-3 cigs a week but plans to stop again..  Vaping Use   Vaping Use: Never used  Substance Use Topics   Alcohol use: No    Alcohol/week: 0.0 standard drinks   Drug use: No    Home Medications Prior to Admission medications   Medication Sig Start Date End Date Taking? Authorizing Provider  acetaminophen (TYLENOL) 500 MG tablet Take 500 mg by mouth every 6 (six) hours as needed for headache.   Yes [provider]  carvedilol (COREG) 12.5 MG tablet Take 1 tablet (12.5 mg total) by mouth 2 (two) times daily with a meal. 03/29/21  Yes [name], [name], MD  linagliptin (TRADJENTA) 5 MG TABS tablet Take 1 tablet (5 mg total) by mouth daily. 04/26/21  Yes [name], [name], MD  Loperamide HCl (IMODIUM PO) Take by mouth as needed.   Yes [provider]  nitroGLYCERIN (NITROSTAT) 0.4 MG SL tablet Place 1 tablet (0.4 mg total) under the tongue every 5 (five) minutes x 3 doses as needed for chest pain (Max 3 doses within 15 min. Call 911). 05/26/20  Yes Sherran Needs, NP  pantoprazole (PROTONIX) 40 MG tablet TAKE 1 TABLET BY MOUTH EVERY DAY Patient taking differently: Take 40 mg by mouth daily. 03/29/21  Yes [name], [name], MD  prochlorperazine (COMPAZINE) 10 MG tablet Take 1 tablet (10 mg total) by mouth every 6 (six) hours as needed for nausea or  vomiting. 02/05/20  Yes Curt Bears, MD  dexamethasone (DECADRON) 2 MG tablet Take 2 mg by mouth daily.    [provider]  dicyclomine (BENTYL) 10 MG capsule Take 1 capsule (10 mg total) by mouth 4 (four) times daily routinely for abdominal cramping. 07/05/21     feeding supplement (BOOST / RESOURCE BREEZE) LIQD Take 1 Container by mouth 3 (three) times daily between meals. Patient not taking: Reported on 09/04/2021    [provider]  glucose blood (ONETOUCH VERIO) test strip Use to test 3-4 times daily. 09/08/19   [name], [name], MD  ipratropium-albuterol (DUONEB) 0.5-2.5 (3) MG/3ML SOLN Take 1.5 mLs by nebulization 3 (three) times daily as needed. 06/27/21   [name], [name], MD  levothyroxine (SYNTHROID) 112 MCG tablet TAKE 1 TABLET BY  MOUTH DAILY BEFORE BREAKFAST. 08/02/21   [name], [name], MD  morphine 10 MG/5ML solution Take by mouth every 2 (two) hours as needed for severe pain. PRN    [provider]  Multiple Vitamin (MULTIVITAMIN WITH MINERALS) TABS tablet Take 1 tablet by mouth daily. 06/23/21   Hongalgi, Lenis Dickinson, MD  oxyCODONE-acetaminophen (PERCOCET) 10-325 MG tablet Take 0.5-1 tablets by mouth every 12 (twelve) hours as needed for pain. 06/28/21   [name], [name], MD  potassium chloride SA (KLOR-CON M20) 20 MEQ tablet Take 1 tablet (20 mEq total) by mouth daily. 06/23/21   Hongalgi, Lenis Dickinson, MD  Spacer/Aero-Holding Chambers (AEROCHAMBER PLUS) inhaler Use as instructed with inahaler. 04/26/21   [name], [name], MD    Allergies    Seasonal ic [cholestatin]  Review of Systems   Review of Systems  Constitutional:  Positive for activity change, appetite change, chills and fatigue.  HENT:  Negative for ear pain and sore throat.   Eyes:  Negative for pain and visual disturbance.  Respiratory:  Positive for cough and shortness of breath.   Cardiovascular:  Negative for chest pain and palpitations.  Gastrointestinal:  Negative for abdominal pain and vomiting.   Genitourinary:  Negative for dysuria and hematuria.  Musculoskeletal:  Negative for arthralgias and back pain.  Skin:  Negative for color change and rash.  Neurological:  Negative for syncope and light-headedness.  All other systems reviewed and are negative.  Physical Exam Updated Vital Signs BP 109/74   Pulse 70   Temp 97.8 F (36.6 C) (Oral)   Resp (!) 22   Ht 5\' 2"  (1.575 m)   Wt 48.1 kg   LMP  (LMP Unknown)   SpO2 100%   BMI 19.40 kg/m   Physical Exam Constitutional:      General: She is not in acute distress. HENT:     Head: Normocephalic and atraumatic.  Eyes:     Conjunctiva/sclera: Conjunctivae normal.     Pupils: Pupils are equal, round, and reactive to light.  Cardiovascular:     Rate and Rhythm: Normal rate and regular rhythm.  Pulmonary:     Effort: Pulmonary effort is normal. No respiratory distress.  Abdominal:     General: There is no distension.     Tenderness: There is no abdominal tenderness.  Skin:    General: Skin is warm and dry.  Neurological:     General: No focal deficit present.     Mental Status: She is alert. Mental status is at baseline.  Psychiatric:        Mood and Affect: Mood normal.        Behavior: Behavior normal.    ED Results / Procedures / Treatments   Labs (all labs ordered are listed, but only abnormal results are displayed) Labs Reviewed  BASIC METABOLIC PANEL - Abnormal; Notable for the following components:      Result Value   Potassium 2.2 (*)    Glucose, Bld 100 (*)    Creatinine, Ser 0.40 (*)    Calcium 8.0 (*)    All other components within normal limits  CBC WITH DIFFERENTIAL/PLATELET - Abnormal; Notable for the following components:   RBC 3.46 (*)    Hemoglobin 10.7 (*)    HCT 33.6 (*)    Platelets 139 (*)    All other components within normal limits  MAGNESIUM - Abnormal; Notable for the following components:   Magnesium 1.6 (*)    All other components within normal limits  POTASSIUM - Abnormal;  Notable for the following components:   Potassium 2.8 (*)    All other components within normal limits  BASIC METABOLIC PANEL - Abnormal; Notable for the following components:   Chloride 114 (*)    Glucose, Bld 67 (*)    Creatinine, Ser 0.32 (*)    Calcium 8.0 (*)    All other components within normal limits  CBC - Abnormal; Notable for the following components:   RBC 3.40 (*)    Hemoglobin 10.2 (*)    HCT 33.5 (*)    Platelets 140 (*)    All other components within normal limits  HEMOGLOBIN A1C - Abnormal; Notable for the following components:   Hgb A1c MFr Bld 6.8 (*)    All other components within normal limits  GLUCOSE, CAPILLARY - Abnormal; Notable for the following components:   Glucose-Capillary 104 (*)    All other components within normal limits  BASIC METABOLIC PANEL - Abnormal; Notable for the following components:   Creatinine, Ser 0.37 (*)    Calcium 8.2 (*)    All other components within normal limits  CBC - Abnormal; Notable for the following components:   WBC 3.1 (*)    RBC 3.34 (*)    Hemoglobin 10.0 (*)    HCT 33.0 (*)    Platelets 142 (*)    All other components within normal limits  GLUCOSE, CAPILLARY - Abnormal; Notable for the following components:   Glucose-Capillary 106 (*)    All other components within normal limits  GLUCOSE, CAPILLARY - Abnormal; Notable for the following components:   Glucose-Capillary 114 (*)    All other components within normal limits  TROPONIN I (HIGH SENSITIVITY) - Abnormal; Notable for the following components:   Troponin I (High Sensitivity) 675 (*)    All other components within normal limits  TROPONIN I (HIGH SENSITIVITY) - Abnormal; Notable for the following components:   Troponin I (High Sensitivity) 916 (*)    All other components within normal limits  TROPONIN I (HIGH SENSITIVITY) - Abnormal; Notable for the following components:   Troponin I (High Sensitivity) 721 (*)    All other components within normal limits   RESP PANEL BY RT-PCR (FLU A&B, COVID) ARPGX2  MAGNESIUM  GLUCOSE, CAPILLARY  GLUCOSE, CAPILLARY  MAGNESIUM    EKG EKG Interpretation  Date/Time:  Wednesday June 21 2021 16:46:00 EDT Ventricular Rate:  64 PR Interval:    QRS Duration: 110 QT Interval:  448 QTC Calculation: 477 R Axis:   92 Text Interpretation: Atrial fibrillation Anteroseptal infarct, age indeterminate Abnormal ECG Confirmed by Carmin Muskrat [phone]) on 06/22/2021 11:12:52 PM  Radiology No results found.  Procedures Procedures   Medications Ordered in ED Medications  perflutren lipid microspheres (DEFINITY) IV suspension (2 mLs Intravenous Given 06/22/21 1224)  sodium chloride 0.9 % bolus 1,000 mL (0 mLs Intravenous Stopped 06/21/21 1530)  potassium chloride SA (KLOR-CON) CR tablet 40 mEq (40 mEq Oral Given 06/21/21 1656)  magnesium oxide (MAG-OX) tablet 800 mg (800 mg Oral Given 06/21/21 1656)  potassium chloride 10 mEq in 100 mL IVPB (0 mEq Intravenous Stopped 06/22/21 0745)  potassium chloride SA (KLOR-CON) CR tablet 40 mEq (40 mEq Oral Given 06/22/21 1019)  magnesium sulfate IVPB 2 g 50 mL (2 g Intravenous New Bag/Given 06/21/21 1756)  iohexol (OMNIPAQUE) 350 MG/ML injection 80 mL (80 mLs Intravenous Contrast Given 06/21/21 1814)  heparin lock flush 100 unit/mL (500 Units Intracatheter Given 06/23/21 1651)    ED Course  I have reviewed the triage vital signs and the nursing notes.  Pertinent labs & imaging results that were available during my care of the patient were reviewed by me and considered in my medical decision making (see chart for details).  Ddx includes viral illness vs PNA vs atypical ACS vs other  Vital signs stable on arrival. Pt signed out to Dr Tinnie Gens pending f/u on labs, which are pending, and reassessment   Clinical Course as of 09/07/21 1958  Wed Jun 21, 2021  1508 Known lung ca with brain mets, between chemo cycles, 3-4 days productive cough, home with doxy vs paxlovid [MK]     Clinical Course User Index [MK] Kommor, [place], MD    Final Clinical Impression(s) / ED Diagnoses Final diagnoses:  Dyspnea  Hypokalemia  Type 2 diabetes mellitus with other specified complication, without long-term current use of insulin ([place])    Rx / DC Orders ED Discharge Orders          Ordered    feeding supplement (ENSURE ENLIVE / ENSURE PLUS) LIQD  Every 24 hours,   Status:  Discontinued        06/23/21 1607    doxycycline (VIBRA-TABS) 100 MG tablet  2 times daily        06/23/21 1607    potassium chloride SA (KLOR-CON M20) 20 MEQ tablet  Daily        06/23/21 1607    Multiple Vitamin (MULTIVITAMIN WITH MINERALS) TABS tablet  Daily        06/23/21 1607    guaiFENesin-dextromethorphan (ROBITUSSIN DM) 100-10 MG/5ML syrup  Every 4 hours PRN,   Status:  Discontinued        06/23/21 1607    Increase activity slowly        06/23/21 1607    Diet - low sodium heart healthy        06/23/21 1607    Diet Carb Modified        06/23/21 1607    Call MD for:       Comments: Worsening diarrhea.   06/23/21 1607    Call MD for:  temperature >100.4        06/23/21 1607    Call MD for:  persistant nausea and vomiting        06/23/21 1607    Call MD for:  severe uncontrolled pain        06/23/21 1607    Call MD for:  difficulty breathing, headache or visual disturbances        06/23/21 1607    Call MD for:  persistant dizziness or light-headedness        06/23/21 1607    Call MD for:  extreme fatigue        06/23/21 1607    Discharge instructions       Comments: feeding supplement (BOOST / RESOURCE BREEZE) liquid 1 Container  Take 1 Container, Oral, 2 times daily between meals,   06/23/21 1607             Wyvonnia Dusky, MD 09/07/21 2001

## 2021-09-08 LAB — CUP PACEART REMOTE DEVICE CHECK
Battery Remaining Longevity: 3 mo — CL
Battery Remaining Percentage: 2 %
Brady Statistic RA Percent Paced: 0 %
Brady Statistic RV Percent Paced: 65 %
Date Time Interrogation Session: 20221209021100
Implantable Lead Implant Date: 19990610
Implantable Lead Implant Date: 20160713
Implantable Lead Location: 753859
Implantable Lead Location: 753860
Implantable Lead Model: 4136
Implantable Lead Serial Number: 29813363
Implantable Pulse Generator Implant Date: 20150130
Lead Channel Impedance Value: 426 Ohm
Lead Channel Impedance Value: 528 Ohm
Lead Channel Pacing Threshold Amplitude: 1.1 V
Lead Channel Pacing Threshold Amplitude: 1.1 V
Lead Channel Pacing Threshold Pulse Width: 0.4 ms
Lead Channel Pacing Threshold Pulse Width: 0.5 ms
Lead Channel Setting Pacing Amplitude: 1.6 V
Lead Channel Setting Pacing Amplitude: 2 V
Lead Channel Setting Pacing Pulse Width: 0.4 ms
Lead Channel Setting Sensing Sensitivity: 2 mV
Pulse Gen Serial Number: 393477

## 2021-09-11 ENCOUNTER — Ambulatory Visit (INDEPENDENT_AMBULATORY_CARE_PROVIDER_SITE_OTHER): Payer: Medicare Other

## 2021-09-11 DIAGNOSIS — I428 Other cardiomyopathies: Secondary | ICD-10-CM

## 2021-09-12 ENCOUNTER — Other Ambulatory Visit: Payer: Self-pay | Admitting: Family Medicine

## 2021-09-12 DIAGNOSIS — E876 Hypokalemia: Secondary | ICD-10-CM

## 2021-09-18 NOTE — Progress Notes (Signed)
Remote pacemaker transmission.   

## 2021-09-21 ENCOUNTER — Ambulatory Visit: Payer: Medicare Other | Admitting: Internal Medicine

## 2021-09-21 ENCOUNTER — Ambulatory Visit: Payer: Medicare Other

## 2021-09-21 ENCOUNTER — Other Ambulatory Visit: Payer: Medicare Other

## 2021-09-27 ENCOUNTER — Encounter: Payer: Self-pay | Admitting: Internal Medicine

## 2021-09-27 ENCOUNTER — Telehealth: Payer: Self-pay

## 2021-09-27 NOTE — Telephone Encounter (Signed)
BSC alert Explant indicator reached on Sep 26, 2021 20:10.  LOV on 08/18/21 with Renee U, PA indicates plan is to changeout device when ERI reached, given minimally invasive procedure despite pt current Hospice status.     Spoke with daughter, [name] (DPR on file).  She confirmed that it is still their wish to replace battery.   Advised that device should be running in backup mode pacing at rate of 85bpm.     Advised that I would forward message to Dr. Curt Bears nurse for review.   Pt daughter awaiting phone call to schedule procedure.

## 2021-10-04 NOTE — Telephone Encounter (Signed)
LVM for patient's Daughter [name] (on DPR) to call the office and schedule appt to discuss gen change for device at [facility]

## 2021-10-05 ENCOUNTER — Encounter: Payer: Self-pay | Admitting: Internal Medicine

## 2021-10-10 ENCOUNTER — Other Ambulatory Visit (HOSPITAL_COMMUNITY): Payer: Self-pay

## 2021-10-12 ENCOUNTER — Ambulatory Visit (INDEPENDENT_AMBULATORY_CARE_PROVIDER_SITE_OTHER): Payer: Commercial Managed Care - HMO

## 2021-10-12 DIAGNOSIS — I428 Other cardiomyopathies: Secondary | ICD-10-CM

## 2021-10-12 LAB — CUP PACEART REMOTE DEVICE CHECK
Brady Statistic RA Percent Paced: 0 %
Brady Statistic RV Percent Paced: 58 %
Date Time Interrogation Session: 20230112021100
Implantable Lead Implant Date: 19990610
Implantable Lead Implant Date: 20160713
Implantable Lead Location: 753859
Implantable Lead Location: 753860
Implantable Lead Model: 4136
Implantable Lead Serial Number: 29813363
Implantable Pulse Generator Implant Date: 20150130
Lead Channel Impedance Value: 482 Ohm
Lead Channel Impedance Value: 559 Ohm
Lead Channel Pacing Threshold Amplitude: 1 V
Lead Channel Pacing Threshold Amplitude: 1.1 V
Lead Channel Pacing Threshold Pulse Width: 0.4 ms
Lead Channel Pacing Threshold Pulse Width: 0.5 ms
Lead Channel Setting Pacing Amplitude: 1.3 V
Lead Channel Setting Pacing Amplitude: 2 V
Lead Channel Setting Pacing Pulse Width: 0.4 ms
Lead Channel Setting Sensing Sensitivity: 2 mV
Pulse Gen Serial Number: 393477

## 2021-10-24 ENCOUNTER — Encounter: Payer: Self-pay | Admitting: Internal Medicine

## 2021-10-24 NOTE — Progress Notes (Signed)
Remote pacemaker transmission.   

## 2021-10-26 ENCOUNTER — Encounter: Payer: Self-pay | Admitting: Internal Medicine

## 2021-10-26 ENCOUNTER — Encounter: Admitting: Cardiology

## 2021-10-26 ENCOUNTER — Encounter (HOSPITAL_COMMUNITY): Payer: Self-pay | Admitting: Oncology

## 2021-10-26 ENCOUNTER — Emergency Department (HOSPITAL_COMMUNITY)
Admission: EM | Admit: 2021-10-26 | Discharge: 2021-10-27 | Disposition: A | Discharge status: Home / Self Care | Payer: Medicaid Other | Attending: Student | Admitting: Student

## 2021-10-26 ENCOUNTER — Other Ambulatory Visit: Payer: Self-pay

## 2021-10-26 ENCOUNTER — Emergency Department (HOSPITAL_COMMUNITY): Payer: Medicaid Other

## 2021-10-26 DIAGNOSIS — E039 Hypothyroidism, unspecified: Secondary | ICD-10-CM | POA: Insufficient documentation

## 2021-10-26 DIAGNOSIS — J329 Chronic sinusitis, unspecified: Secondary | ICD-10-CM | POA: Insufficient documentation

## 2021-10-26 DIAGNOSIS — I110 Hypertensive heart disease with heart failure: Secondary | ICD-10-CM | POA: Diagnosis not present

## 2021-10-26 DIAGNOSIS — F17210 Nicotine dependence, cigarettes, uncomplicated: Secondary | ICD-10-CM | POA: Insufficient documentation

## 2021-10-26 DIAGNOSIS — I2510 Atherosclerotic heart disease of native coronary artery without angina pectoris: Secondary | ICD-10-CM | POA: Insufficient documentation

## 2021-10-26 DIAGNOSIS — E876 Hypokalemia: Secondary | ICD-10-CM | POA: Insufficient documentation

## 2021-10-26 DIAGNOSIS — R6889 Other general symptoms and signs: Secondary | ICD-10-CM | POA: Diagnosis not present

## 2021-10-26 DIAGNOSIS — R1111 Vomiting without nausea: Secondary | ICD-10-CM | POA: Diagnosis not present

## 2021-10-26 DIAGNOSIS — Z950 Presence of cardiac pacemaker: Secondary | ICD-10-CM | POA: Diagnosis not present

## 2021-10-26 DIAGNOSIS — U071 COVID-19: Secondary | ICD-10-CM | POA: Diagnosis not present

## 2021-10-26 DIAGNOSIS — E785 Hyperlipidemia, unspecified: Secondary | ICD-10-CM | POA: Insufficient documentation

## 2021-10-26 DIAGNOSIS — R109 Unspecified abdominal pain: Secondary | ICD-10-CM | POA: Insufficient documentation

## 2021-10-26 DIAGNOSIS — Z743 Need for continuous supervision: Secondary | ICD-10-CM | POA: Diagnosis not present

## 2021-10-26 DIAGNOSIS — R110 Nausea: Secondary | ICD-10-CM | POA: Diagnosis not present

## 2021-10-26 DIAGNOSIS — I5022 Chronic systolic (congestive) heart failure: Secondary | ICD-10-CM | POA: Insufficient documentation

## 2021-10-26 DIAGNOSIS — Z85841 Personal history of malignant neoplasm of brain: Secondary | ICD-10-CM | POA: Insufficient documentation

## 2021-10-26 DIAGNOSIS — Z85118 Personal history of other malignant neoplasm of bronchus and lung: Secondary | ICD-10-CM | POA: Insufficient documentation

## 2021-10-26 DIAGNOSIS — R112 Nausea with vomiting, unspecified: Secondary | ICD-10-CM | POA: Diagnosis present

## 2021-10-26 DIAGNOSIS — E1169 Type 2 diabetes mellitus with other specified complication: Secondary | ICD-10-CM | POA: Diagnosis not present

## 2021-10-26 DIAGNOSIS — G4489 Other headache syndrome: Secondary | ICD-10-CM | POA: Diagnosis not present

## 2021-10-26 DIAGNOSIS — J449 Chronic obstructive pulmonary disease, unspecified: Secondary | ICD-10-CM | POA: Diagnosis not present

## 2021-10-26 LAB — COMPREHENSIVE METABOLIC PANEL
ALT: 28 U/L (ref 0–44)
AST: 18 U/L (ref 15–41)
Albumin: 3.4 g/dL — ABNORMAL LOW (ref 3.5–5.0)
Alkaline Phosphatase: 54 U/L (ref 38–126)
Anion gap: 5 (ref 5–15)
BUN: 11 mg/dL (ref 8–23)
CO2: 28 mmol/L (ref 22–32)
Calcium: 8.8 mg/dL — ABNORMAL LOW (ref 8.9–10.3)
Chloride: 103 mmol/L (ref 98–111)
Creatinine, Ser: 0.36 mg/dL — ABNORMAL LOW (ref 0.44–1.00)
GFR, Estimated: >60 mL/min (ref >60)
Glucose, Bld: 92 mg/dL (ref 70–99)
Potassium: 3.4 mmol/L — ABNORMAL LOW (ref 3.5–5.1)
Sodium: 136 mmol/L (ref 135–145)
Total Bilirubin: 0.9 mg/dL (ref 0.3–1.2)
Total Protein: 6 g/dL — ABNORMAL LOW (ref 6.5–8.1)

## 2021-10-26 LAB — CBC WITH DIFFERENTIAL/PLATELET
Abs Immature Granulocytes: 0.03 10*3/uL (ref 0.00–0.07)
Basophils Absolute: 0 10*3/uL (ref 0.0–0.1)
Basophils Relative: 0 %
Eosinophils Absolute: 0 10*3/uL (ref 0.0–0.5)
Eosinophils Relative: 1 %
HCT: 38.3 % (ref 36.0–46.0)
Hemoglobin: 12.3 g/dL (ref 12.0–15.0)
Immature Granulocytes: 1 %
Lymphocytes Relative: 14 %
Lymphs Abs: 0.9 10*3/uL (ref 0.7–4.0)
MCH: 30.8 pg (ref 26.0–34.0)
MCHC: 32.1 g/dL (ref 30.0–36.0)
MCV: 95.8 fL (ref 80.0–100.0)
Monocytes Absolute: 0.6 10*3/uL (ref 0.1–1.0)
Monocytes Relative: 10 %
Neutro Abs: 4.8 10*3/uL (ref 1.7–7.7)
Neutrophils Relative %: 74 %
Platelets: 111 10*3/uL — ABNORMAL LOW (ref 150–400)
RBC: 4 MIL/uL (ref 3.87–5.11)
RDW: 15.5 % (ref 11.5–15.5)
WBC: 6.3 10*3/uL (ref 4.0–10.5)
nRBC: 0 % (ref 0.0–0.2)

## 2021-10-26 LAB — RESP PANEL BY RT-PCR (FLU A&B, COVID) ARPGX2
Influenza A by PCR: NEGATIVE
Influenza B by PCR: NEGATIVE
SARS Coronavirus 2 by RT PCR: POSITIVE — AB

## 2021-10-26 LAB — LIPASE, BLOOD: Lipase: 21 U/L (ref 11–51)

## 2021-10-26 MED ORDER — NIRMATRELVIR/RITONAVIR (PAXLOVID)TABLET
3.0000 | ORAL_TABLET | Freq: Two times a day (BID) | ORAL | 0 refills | Status: AC
  Filled 2021-10-26: qty 30, 5d supply, fill #0

## 2021-10-26 MED ORDER — PROCHLORPERAZINE EDISYLATE 10 MG/2ML IJ SOLN
10.0000 mg | Freq: Once | INTRAMUSCULAR | Status: AC
  Administered 2021-10-26: 10 mg via INTRAVENOUS
  Filled 2021-10-26: qty 2

## 2021-10-26 MED ORDER — HEPARIN SOD (PORK) LOCK FLUSH 100 UNIT/ML IV SOLN
500.0000 [IU] | Freq: Once | INTRAVENOUS | Status: AC
  Administered 2021-10-26: 500 [IU]
  Filled 2021-10-26: qty 5

## 2021-10-26 MED ORDER — DIPHENHYDRAMINE HCL 50 MG/ML IJ SOLN
25.0000 mg | Freq: Once | INTRAMUSCULAR | Status: AC
  Administered 2021-10-26: 25 mg via INTRAVENOUS
  Filled 2021-10-26: qty 1

## 2021-10-26 NOTE — ED Notes (Signed)
Patient transported to CT 

## 2021-10-26 NOTE — ED Provider Notes (Signed)
[facility] DEPT Provider Note  CSN: [id number] Arrival date & time: 10/26/21 1725  Chief Complaint(s) Abdominal Pain  HPI [name] is a 87 y.o. female with PMH CAD, pacemaker placement, CHF, COPD, metastatic lung cancer on hospice, permanent A. fib who presents emergency department for evaluation of nausea vomiting and headache.  Patient states that she had a single episode of abdominal pain nausea and vomiting that has since resolved but now she has a persistent headache.  She denies chest pain, shortness of breath, headache, fever or other systemic symptoms.   Abdominal Pain Associated symptoms: nausea and vomiting    Past Medical History Past Medical History:  Diagnosis Date   Anemia    CAD (coronary artery disease), native coronary artery    PTCA of distal right coronary artery 1997 Cypher stents to circumflex 2005 Cardiac cath in 2011 with patent stent to circumflex and moderate disease elsewhere treated medically    Cardiac pacemaker in situ 12/24/2015   Original implant reportedly in 1991 for tachybradycardia syndrome, generator change in 1999, 2004 and evidently again in 2016 in [place]    Chronic systolic CHF (congestive heart failure) (HCC)    COPD (chronic obstructive pulmonary disease) ([place])    Diabetes mellitus without complication ([place])    Hypertension    Hypothyroidism 03/23/2010   Lung cancer ([place])    Pacemaker    Permanent atrial fibrillation ([place]) 03/23/2010   Prolonged Q-T interval on ECG    Patient Active Problem List   Diagnosis Date Noted   QT prolongation 06/23/2021   Hypokalemia 06/21/2021   Secondary hypercoagulable state ([place]) 04/26/2021   Metastatic cancer to brain ([place]) 03/16/2021   Epigastric abdominal pain 04/27/2020   Port-A-Cath in place 04/11/2020   Small cell lung cancer, right ([place]) 01/28/2020   Encounter for antineoplastic chemotherapy 01/28/2020   Encounter for antineoplastic immunotherapy 01/28/2020    Goals of care, counseling/discussion 01/28/2020   Near syncope 01/05/2020   Mediastinal lymphadenopathy 01/01/2020   Multiple lung nodules on CT 01/01/2020   Atherosclerosis of aorta ([place]) 12/28/2019   Chronic diarrhea 12/15/2019   Hyperlipidemia associated with type 2 diabetes mellitus ([place]) 06/10/2019   Chronic rhinitis 02/28/2018   Tobacco use disorder 05/10/2017   Hypertension with heart disease 05/10/2017   Chest pain 03/10/2017   Acute thoracic back pain    Cardiac pacemaker in situ 12/24/2015   History of Graves' disease 12/24/2015   Current use of long term anticoagulation 12/24/2015   Type 2 diabetes mellitus with other specified complication ([place])    Hypothyroidism 03/23/2010   Paroxysmal atrial fibrillation ([place]) 03/23/2010   COPD (chronic obstructive pulmonary disease) ([place]) 03/23/2010   CAD (coronary artery disease), native coronary artery    GERD    Home Medication(s) Prior to Admission medications   Medication Sig Start Date End Date Taking? Authorizing Provider  nirmatrelvir/ritonavir EUA (PAXLOVID) 20 x 150 MG & 10 x 100MG  TABS Take 3 tablets by mouth 2 (two) times daily for 5 days. Patient GFR is >60. Take nirmatrelvir (150 mg) two tablets twice daily for 5 days and ritonavir (100 mg) one tablet twice daily for 5 days. 10/26/21 10/31/21 Yes Jaelen Soth, MD  acetaminophen (TYLENOL) 500 MG tablet Take 500 mg by mouth every 6 (six) hours as needed for headache.    [provider]  carvedilol (COREG) 12.5 MG tablet Take 1 tablet (12.5 mg total) by mouth 2 (two) times daily with a meal. 03/29/21   [name], [name], MD  dexamethasone (DECADRON) 2 MG tablet Take 2 mg by mouth daily.    [provider]  dicyclomine (BENTYL) 10 MG capsule Take 1 capsule (10 mg total) by mouth 4 (four) times daily routinely for abdominal cramping. 07/05/21     feeding supplement (BOOST / RESOURCE BREEZE) LIQD Take 1 Container by mouth 3 (three) times daily between  meals. Patient not taking: Reported on 09/04/2021    [provider]  glucose blood (ONETOUCH VERIO) test strip Use to test 3-4 times daily. 09/08/19   [name], [name], MD  ipratropium-albuterol (DUONEB) 0.5-2.5 (3) MG/3ML SOLN Take 1.5 mLs by nebulization 3 (three) times daily as needed. 06/27/21   [name], [name], MD  levothyroxine (SYNTHROID) 112 MCG tablet TAKE 1 TABLET BY MOUTH DAILY BEFORE BREAKFAST. 08/02/21   [name], [name], MD  linagliptin (TRADJENTA) 5 MG TABS tablet Take 1 tablet (5 mg total) by mouth daily. 04/26/21   [name], [name], MD  Loperamide HCl (IMODIUM PO) Take by mouth as needed.    [provider]  morphine 10 MG/5ML solution Take by mouth every 2 (two) hours as needed for severe pain. PRN    [provider]  Multiple Vitamin (MULTIVITAMIN WITH MINERALS) TABS tablet Take 1 tablet by mouth daily. 06/23/21   Hongalgi, Lenis Dickinson, MD  nitroGLYCERIN (NITROSTAT) 0.4 MG SL tablet Place 1 tablet (0.4 mg total) under the tongue every 5 (five) minutes x 3 doses as needed for chest pain (Max 3 doses within 15 min. Call 911). 05/26/20   Sherran Needs, NP  oxyCODONE-acetaminophen (PERCOCET) 10-325 MG tablet Take 0.5-1 tablets by mouth every 12 (twelve) hours as needed for pain. 06/28/21   [name], [name], MD  pantoprazole (PROTONIX) 40 MG tablet TAKE 1 TABLET BY MOUTH EVERY DAY Patient taking differently: Take 40 mg by mouth daily. 03/29/21   [name], [name], MD  potassium chloride SA (KLOR-CON M20) 20 MEQ tablet Take 1 tablet (20 mEq total) by mouth daily. 09/13/21   [name], [name], MD  prochlorperazine (COMPAZINE) 10 MG tablet Take 1 tablet (10 mg total) by mouth every 6 (six) hours as needed for nausea or vomiting. 02/05/20   Curt Bears, MD  Spacer/Aero-Holding Chambers (AEROCHAMBER PLUS) inhaler Use as instructed with inahaler. 04/26/21   [name], [name], MD                                                                                                                                     Past Surgical History Past Surgical History:  Procedure Laterality Date   BIOPSY  01/08/2020   Procedure: BIOPSY;  Surgeon: Garner Nash, DO;  Location: [facility];  Service: Pulmonary;;   BRONCHIAL BRUSHINGS  01/08/2020   Procedure: BRONCHIAL BRUSHINGS;  Surgeon: Garner Nash, DO;  Location: [facility];  Service: Pulmonary;;   BRONCHIAL WASHINGS  01/08/2020   Procedure: BRONCHIAL WASHINGS;  Surgeon: Garner Nash,  DO;  Location: [facility];  Service: Pulmonary;;   CARDIAC CATHETERIZATION N/A 12/26/2015   Procedure: Left Heart Cath and Coronary Angiography;  Surgeon: Peter M [name], MD;  Location: [facility];  Service: Cardiovascular;  Laterality: N/A;   CARDIOVERSION  05/2015   CHOLECYSTECTOMY  2012   CORONARY ANGIOPLASTY WITH STENT PLACEMENT  1990   ENDOBRONCHIAL ULTRASOUND  01/08/2020   Procedure: ENDOBRONCHIAL ULTRASOUND;  Surgeon: Garner Nash, DO;  Location: [facility];  Service: Pulmonary;;   FINE NEEDLE ASPIRATION BIOPSY  01/08/2020   Procedure: FINE NEEDLE ASPIRATION BIOPSY;  Surgeon: Garner Nash, DO;  Location: [facility];  Service: Pulmonary;;   HERNIA REPAIR     IR IMAGING GUIDED PORT INSERTION  02/18/2020   PACEMAKER GENERATOR CHANGE  [id number], 2016   PACEMAKER INSERTION  1991   VIDEO BRONCHOSCOPY WITH ENDOBRONCHIAL ULTRASOUND N/A 01/08/2020   Procedure: VIDEO BRONCHOSCOPY;  Surgeon: Garner Nash, DO;  Location: [facility];  Service: Pulmonary;  Laterality: N/A;   Family History Family History  Problem Relation Age of Onset   Heart disease Father    Heart disease Mother    Heart attack Mother    Cirrhosis Brother    Colon cancer Neg Hx    Stomach cancer Neg Hx     Social History Social History   Tobacco Use   Smoking status: Some Days    Packs/day: 0.25    Years: 66.00    Pack years: 16.50    Types: Cigarettes    Last attempt to quit: 12/27/2019    Years since quitting: 1.8   Smokeless tobacco: Never    Tobacco comments:    03/16/21: Per pt, she recently began smoking 2-3 cigs a week but plans to stop again..  Vaping Use   Vaping Use: Never used  Substance Use Topics   Alcohol use: No    Alcohol/week: 0.0 standard drinks   Drug use: No   Allergies Seasonal ic [cholestatin]  Review of Systems Review of Systems  Gastrointestinal:  Positive for abdominal pain, nausea and vomiting.  Neurological:  Positive for headaches.   Physical Exam Vital Signs  I have reviewed the triage vital signs BP (!) 119/43    Pulse 71    Temp 99.2 F (37.3 C) (Oral)    Resp (!) 23    LMP  (LMP Unknown)    SpO2 97%   Physical Exam Vitals and nursing note reviewed.  Constitutional:      General: She is not in acute distress.    Appearance: She is well-developed.  HENT:     Head: Normocephalic and atraumatic.  Eyes:     Conjunctiva/sclera: Conjunctivae normal.  Cardiovascular:     Rate and Rhythm: Normal rate and regular rhythm.     Heart sounds: No murmur heard. Pulmonary:     Effort: Pulmonary effort is normal. No respiratory distress.     Breath sounds: Normal breath sounds.  Abdominal:     Palpations: Abdomen is soft.     Tenderness: There is no abdominal tenderness.  Musculoskeletal:        General: No swelling.     Cervical back: Neck supple.  Skin:    General: Skin is warm and dry.     Capillary Refill: Capillary refill takes less than 2 seconds.  Neurological:     Mental Status: She is alert.  Psychiatric:        Mood and Affect: Mood normal.    ED Results and Treatments  Labs (all labs ordered are listed, but only abnormal results are displayed) Labs Reviewed  RESP PANEL BY RT-PCR (FLU A&B, COVID) ARPGX2 - Abnormal; Notable for the following components:      Result Value   SARS Coronavirus 2 by RT PCR POSITIVE (*)    All other components within normal limits  CBC WITH DIFFERENTIAL/PLATELET - Abnormal; Notable for the following components:   Platelets 111 (*)    All other  components within normal limits  COMPREHENSIVE METABOLIC PANEL - Abnormal; Notable for the following components:   Potassium 3.4 (*)    Creatinine, Ser 0.36 (*)    Calcium 8.8 (*)    Total Protein 6.0 (*)    Albumin 3.4 (*)    All other components within normal limits  LIPASE, BLOOD  URINALYSIS, ROUTINE W REFLEX MICROSCOPIC                                                                                                                          Radiology CT Head Wo Contrast  Result Date: 10/26/2021 CLINICAL DATA:  Headache. Nausea, vomiting and diarrhea that began today. EXAM: CT HEAD WITHOUT CONTRAST TECHNIQUE: Contiguous axial images were obtained from the base of the skull through the vertex without intravenous contrast. RADIATION DOSE REDUCTION: This exam was performed according to the departmental dose-optimization program which includes automated exposure control, adjustment of the mA and/or kV according to patient size and/or use of iterative reconstruction technique. COMPARISON:  07/13/2021 FINDINGS: Brain: Mild-to-moderate enlargement of the ventricles and subarachnoid spaces. Moderate patchy white matter low density in both cerebral hemispheres. Small rounded area of increased density in the right occipital lobe at the location of a previously treated mass, without significant change. No intracranial hemorrhage, mass lesion or CT evidence of acute infarction. Vascular: No hyperdense vessel or unexpected calcification. Skull: Normal. Negative for fracture or focal lesion. Sinuses/Orbits: Interval bilateral maxillary sinus mucosal thickening, extensive bilateral ethmoid sinus mucosal thickening, inferior bilateral frontal sinus mucosal thickening, bilateral anterior sphenoid sinus mucosal thickening and soft tissue density extending from the left maxillary sinus into the nasal cavity with similar density in the nasal cavity on the right. No acute orbital abnormality. Interval bilateral mastoid  air cell opacification and increased density in both middle ears. Other: None. IMPRESSION: 1. No intracranial hemorrhage or new mass. 2. Mildly progressive atrophy and chronic small vessel white matter ischemic changes. 3. Interval extensive chronic sinusitis with involvement of both nasal cavities. 4. Progressive bilateral middle ear and mastoid sinus opacification. Electronically Signed   By: Claudie Revering M.D.   On: 10/26/2021 19:09    Pertinent labs & imaging results that were available during my care of the patient were reviewed by me and considered in my medical decision making (see MDM for details).  Medications Ordered in ED Medications  heparin lock flush 100 unit/mL (has no administration in time range)  prochlorperazine (COMPAZINE) injection 10 mg (10 mg Intravenous Given 10/26/21 1900)  diphenhydrAMINE (BENADRYL) injection  25 mg (25 mg Intravenous Given 10/26/21 1902)                                                                                                                                     Procedures Procedures  (including critical care time)  Medical Decision Making / ED Course   This patient presents to the ED for concern of nausea vomiting headache, this involves an extensive number of treatment options, and is a complaint that carries with it a high risk of complications and morbidity.  The differential diagnosis includes COVID-19, influenza, pancreatitis, progression of underlying metastatic disease, abdominal infection  MDM: Patient seen emergency department for evaluation of abdominal pain nausea and vomiting that has since resolved but with a persistent headache.  Physical exam is unremarkable with no significant abdominal tenderness to palpation.  Neurologic exam unremarkable with no focal motor or sensory deficits.  Laboratory evaluation with a very mild hypokalemia but is otherwise unremarkable.  CT head with no evidence of metastatic disease.  Patient given headache  cocktail leading to complete resolution of her symptoms.  Patient is COVID-positive which likely explains majority of her symptoms today.  On reevaluation, patient well-appearing with improved symptoms and she was discharged with a prescription for paxlovid.   Additional history obtained: -Additional history obtained from daughter -External records from outside source obtained and reviewed including: Chart review including previous notes, labs, imaging, consultation notes   Lab Tests: -I ordered, reviewed, and interpreted labs.   The pertinent results include:   Labs Reviewed  RESP PANEL BY RT-PCR (FLU A&B, COVID) ARPGX2 - Abnormal; Notable for the following components:      Result Value   SARS Coronavirus 2 by RT PCR POSITIVE (*)    All other components within normal limits  CBC WITH DIFFERENTIAL/PLATELET - Abnormal; Notable for the following components:   Platelets 111 (*)    All other components within normal limits  COMPREHENSIVE METABOLIC PANEL - Abnormal; Notable for the following components:   Potassium 3.4 (*)    Creatinine, Ser 0.36 (*)    Calcium 8.8 (*)    Total Protein 6.0 (*)    Albumin 3.4 (*)    All other components within normal limits  LIPASE, BLOOD  URINALYSIS, ROUTINE W REFLEX MICROSCOPIC       Imaging Studies ordered: I ordered imaging studies including CTH I independently visualized and interpreted imaging. I agree with the radiologist interpretation   Medicines ordered and prescription drug management: Meds ordered this encounter  Medications   prochlorperazine (COMPAZINE) injection 10 mg   diphenhydrAMINE (BENADRYL) injection 25 mg   nirmatrelvir/ritonavir EUA (PAXLOVID) 20 x 150 MG & 10 x 100MG  TABS    Sig: Take 3 tablets by mouth 2 (two) times daily for 5 days. Patient GFR is >60. Take nirmatrelvir (150 mg) two tablets twice daily for 5 days and ritonavir (100 mg) one tablet twice daily for 5  days.    Dispense:  30 tablet    Refill:  0    heparin lock flush 100 unit/mL    -I have reviewed the patients home medicines and have made adjustments as needed  Critical interventions none  Cardiac Monitoring: The patient was maintained on a cardiac monitor.  I personally viewed and interpreted the cardiac monitored which showed an underlying rhythm of: rate controlled afib  Social Determinants of Health:  Factors impacting patients care include: none   Reevaluation: After the interventions noted above, I reevaluated the patient and found that they have :improved  Co morbidities that complicate the patient evaluation  Past Medical History:  Diagnosis Date   Anemia    CAD (coronary artery disease), native coronary artery    PTCA of distal right coronary artery 1997 Cypher stents to circumflex 2005 Cardiac cath in 2011 with patent stent to circumflex and moderate disease elsewhere treated medically    Cardiac pacemaker in situ 12/24/2015   Original implant reportedly in 1991 for tachybradycardia syndrome, generator change in 1999, 2004 and evidently again in 2016 in [place]    Chronic systolic CHF (congestive heart failure) (HCC)    COPD (chronic obstructive pulmonary disease) ([place])    Diabetes mellitus without complication ([place])    Hypertension    Hypothyroidism 03/23/2010   Lung cancer ([place])    Pacemaker    Permanent atrial fibrillation ([place]) 03/23/2010   Prolonged Q-T interval on ECG       Dispostion: I considered admission for this patient, she does not meet inpatient criteria in the setting of a known COVID-19 infection.  She is hemodynamically stable with all symptoms improved.  Safer discharge with outpatient follow-up.     Final Clinical Impression(s) / ED Diagnoses Final diagnoses:  [id number]     @PCDICTATION @    Teressa Lower, MD 10/26/21 2048

## 2021-10-26 NOTE — ED Notes (Signed)
[facility] called for transport.  

## 2021-10-26 NOTE — ED Triage Notes (Signed)
Pt bib [facility] from home d/t abdominal pain, n/v/d that began today.  Pt is on hospice services for metastatic cancer.  4 mg zofran given en route.

## 2021-10-27 ENCOUNTER — Encounter: Payer: Self-pay | Admitting: Internal Medicine

## 2021-10-27 ENCOUNTER — Other Ambulatory Visit (HOSPITAL_COMMUNITY): Payer: Self-pay

## 2021-10-27 DIAGNOSIS — Z743 Need for continuous supervision: Secondary | ICD-10-CM | POA: Diagnosis not present

## 2021-10-27 DIAGNOSIS — Z7401 Bed confinement status: Secondary | ICD-10-CM | POA: Diagnosis not present

## 2021-10-27 DIAGNOSIS — R6889 Other general symptoms and signs: Secondary | ICD-10-CM | POA: Diagnosis not present

## 2021-10-27 DIAGNOSIS — I499 Cardiac arrhythmia, unspecified: Secondary | ICD-10-CM | POA: Diagnosis not present

## 2021-10-27 DIAGNOSIS — R109 Unspecified abdominal pain: Secondary | ICD-10-CM | POA: Diagnosis not present

## 2021-11-01 ENCOUNTER — Encounter: Payer: Self-pay | Admitting: Internal Medicine

## 2021-11-06 NOTE — Progress Notes (Signed)
Cardiology Office Note Date:  11/06/2021  Patient ID:  [name] [birth year], MRN [id number] PCP:  [name], [name], MD  Cardiologist:  Dr. Tamala Julian Electrophysiologist: Dr .Curt Bears     Chief Complaint:  planned f/u  History of Present Illness: [name] is a 87 y.o. female with history of CAD (remote PCI to RCA, LCx), COPD, AFib (not on a/c it seems due to some bleeding noted on her CT of the head), tachy/brady w/PPM, HTN, HLD, DM. Lung cancer with mets to brain, bone, lymphadenopathy Chronic diarrhea  She comes today to be seen for Dr. Curt Bears, last seen by him June 2022.  Discussed that previously with d/w the patient, planned for rate control strategy. Was pending start of radiation therapy with hx of lung cancer and new spot found on her brain. No changes were made.  She had a hospital stay in September, went initially for generalized weakness, cough, chills, nausea, 1 episode of vomiting, and watery diarrhea for 2 weeks, labs quite abnormal including marked hypokalemia, low mag, she was admitted EKG was abnormal  and cardiology consulted, HS Trop abn Echo noted marked reduction in her LVEF, this suspect 2/2 her chemo Ultimately given metastatic cancer, comorbid conditions/constellation of medical issues not felt a candidate for ischemic w/u and recommended traetment towards QOL rather then aggressive interventions  I saw her 08/18/21 She is accompanied by her daughter that she lives with Since her hospitalization the patient has decided no longer to pursue chemo or further management of her cancer She is under Hospice care, still getting her med, though no plans for any aggressive/invasive care She is ambulatory to some degree She denies any CP, has some pain right shoulder blade > the left She has poor appetite and not eating well, recently started on steroid which has helped her oral intake and is more animated as well. No near syncope or syncope She is off a/c, her  daughter says that this is indeed 2/2 prior bleeding found in her head She was off her ARB 2/2 low BP and BB had been reduced, limiting GDMT No plans for a/c given metastatic lesion. They wanted to pursue gen change when needed.  TODAY She is again accompanied by her daughter She is doing about the same, very sedentary, can ambulate some, but pretty weak The pt tells me she is tired and hungry!  She reports some CP, it is a little hard seems to be her whole chest that aches, "all the time", in d/w he daughter sounds like this is a chronic pain, that has been discussed and given her cancer, uncertain the cause, perhaps the tumor. We discussed toady that the pan remains the same, ongoing medications, and medical management, no aggressive measures, but they do want to plan for gen change when the time came. They deny any near syncope or syncope No perception of palpitations    Device information [employer] dual chamber PPM last gen change, 10/30/2013 RA, guidant 04/13/2015 RV lead, [employer]medics, 03/10/1998   AAD Multaq, intolerant of BID dosing with GI side effects   Past Medical History:  Diagnosis Date   Anemia    CAD (coronary artery disease), native coronary artery    PTCA of distal right coronary artery 1997 Cypher stents to circumflex 2005 Cardiac cath in 2011 with patent stent to circumflex and moderate disease elsewhere treated medically    Cardiac pacemaker in situ 12/24/2015   Original implant reportedly in 1991 for tachybradycardia syndrome, generator change in 1999, 2004  and evidently again in 2016 in [place]    Chronic systolic CHF (congestive heart failure) (HCC)    COPD (chronic obstructive pulmonary disease) ([place])    Diabetes mellitus without complication ([place])    Hypertension    Hypothyroidism 03/23/2010   Lung cancer ([place])    Pacemaker    Permanent atrial fibrillation ([place]) 03/23/2010   Prolonged Q-T interval on ECG     Past Surgical History:  Procedure Laterality  Date   BIOPSY  01/08/2020   Procedure: BIOPSY;  Surgeon: Garner Nash, DO;  Location: [facility];  Service: Pulmonary;;   BRONCHIAL BRUSHINGS  01/08/2020   Procedure: BRONCHIAL BRUSHINGS;  Surgeon: Garner Nash, DO;  Location: [facility];  Service: Pulmonary;;   BRONCHIAL WASHINGS  01/08/2020   Procedure: BRONCHIAL WASHINGS;  Surgeon: Garner Nash, DO;  Location: [facility];  Service: Pulmonary;;   CARDIAC CATHETERIZATION N/A 12/26/2015   Procedure: Left Heart Cath and Coronary Angiography;  Surgeon: Peter M [name], MD;  Location: [facility];  Service: Cardiovascular;  Laterality: N/A;   CARDIOVERSION  05/2015   CHOLECYSTECTOMY  2012   CORONARY ANGIOPLASTY WITH STENT PLACEMENT  1990   ENDOBRONCHIAL ULTRASOUND  01/08/2020   Procedure: ENDOBRONCHIAL ULTRASOUND;  Surgeon: Garner Nash, DO;  Location: [facility];  Service: Pulmonary;;   FINE NEEDLE ASPIRATION BIOPSY  01/08/2020   Procedure: FINE NEEDLE ASPIRATION BIOPSY;  Surgeon: Garner Nash, DO;  Location: [facility];  Service: Pulmonary;;   HERNIA REPAIR     IR IMAGING GUIDED PORT INSERTION  02/18/2020   PACEMAKER GENERATOR CHANGE  5366,4403, 2016   PACEMAKER INSERTION  1991   VIDEO BRONCHOSCOPY WITH ENDOBRONCHIAL ULTRASOUND N/A 01/08/2020   Procedure: VIDEO BRONCHOSCOPY;  Surgeon: Garner Nash, DO;  Location: [facility];  Service: Pulmonary;  Laterality: N/A;    Current Outpatient Medications  Medication Sig Dispense Refill   acetaminophen (TYLENOL) 500 MG tablet Take 500 mg by mouth every 6 (six) hours as needed for headache.     carvedilol (COREG) 12.5 MG tablet Take 1 tablet (12.5 mg total) by mouth 2 (two) times daily with a meal. 180 tablet 3   dexamethasone (DECADRON) 2 MG tablet Take 2 mg by mouth daily.     dicyclomine (BENTYL) 10 MG capsule Take 1 capsule (10 mg total) by mouth 4 (four) times daily routinely for abdominal cramping. 120 capsule 1   feeding supplement (BOOST / RESOURCE BREEZE) LIQD  Take 1 Container by mouth 3 (three) times daily between meals. (Patient not taking: Reported on 09/04/2021)     glucose blood (ONETOUCH VERIO) test strip Use to test 3-4 times daily. 300 strip 4   ipratropium-albuterol (DUONEB) 0.5-2.5 (3) MG/3ML SOLN Take 1.5 mLs by nebulization 3 (three) times daily as needed. 360 mL 1   levothyroxine (SYNTHROID) 112 MCG tablet TAKE 1 TABLET BY MOUTH DAILY BEFORE BREAKFAST. 90 tablet 2   linagliptin (TRADJENTA) 5 MG TABS tablet Take 1 tablet (5 mg total) by mouth daily. 90 tablet 1   Loperamide HCl (IMODIUM PO) Take by mouth as needed.     morphine 10 MG/5ML solution Take by mouth every 2 (two) hours as needed for severe pain. PRN     Multiple Vitamin (MULTIVITAMIN WITH MINERALS) TABS tablet Take 1 tablet by mouth daily.     nitroGLYCERIN (NITROSTAT) 0.4 MG SL tablet Place 1 tablet (0.4 mg total) under the tongue every 5 (five) minutes x 3 doses as needed for chest pain (Max 3  doses within 15 min. Call 911). 10 tablet 0   oxyCODONE-acetaminophen (PERCOCET) 10-325 MG tablet Take 0.5-1 tablets by mouth every 12 (twelve) hours as needed for pain. 30 tablet 0   pantoprazole (PROTONIX) 40 MG tablet TAKE 1 TABLET BY MOUTH EVERY DAY (Patient taking differently: Take 40 mg by mouth daily.) 90 tablet 3   potassium chloride SA (KLOR-CON M20) 20 MEQ tablet Take 1 tablet (20 mEq total) by mouth daily. 30 tablet 2   prochlorperazine (COMPAZINE) 10 MG tablet Take 1 tablet (10 mg total) by mouth every 6 (six) hours as needed for nausea or vomiting. 30 tablet 1   Spacer/Aero-Holding Chambers (AEROCHAMBER PLUS) inhaler Use as instructed with inahaler. 1 each 1   No current facility-administered medications for this visit.    Allergies:   Seasonal ic [cholestatin]   Social History:  The patient  reports that she has been smoking cigarettes. She has a 16.50 pack-year smoking history. She has never used smokeless tobacco. She reports that she does not drink alcohol and does not use  drugs.   Family History:  The patient's family history includes Cirrhosis in her brother; Heart attack in her mother; Heart disease in her father and mother.  ROS:  Please see the history of present illness.    All other systems are reviewed and otherwise negative.   PHYSICAL EXAM:  VS:  LMP  (LMP Unknown)  BMI: There is no height or weight on file to calculate BMI. Well nourished, well developed, in no acute distress, chronically ill appearing HEENT: normocephalic, atraumatic Neck: no JVD, carotid bruits or masses Cardiac:  RRR; no significant murmurs, no rubs, or gallops Lungs: CTA b/l, no wheezing, rhonchi or rales Abd: soft, nontender MS: no deformity, advanced atrophy Ext:  she has some swelling on the dorsum  of her R  foot, no erythema, pain, heat, Skin: warm and dry, no rash Neuro:  No gross deficits appreciated Psych: euthymic mood, full affect  PPM site is stable, no tethering or discomfort   EKG:  done today and reviewed by myself Afib w/both VS and VP beats, 62bpm  Device interrogation done today and reviewed by myself:  Battery reached ERI on 1//11/23 Lead measurements are good She is in Afib , mostly VP with some VS as well NSVT episodes, available EGMs reviewed are brief RVR     echo 06/22/21  1. Global hypokinesis with akinesis of the distal anteroseptal wall, apex  and distal inferior wall; overall severe LV dysfunction. LV function worse  compared to previous.   2. Left ventricular ejection fraction, by estimation, is 25 to 30%. The  left ventricle has severely decreased function. The left ventricle  demonstrates global hypokinesis. Left ventricular diastolic parameters are  indeterminate. The average left  ventricular global longitudinal strain is -7.0 %. The global longitudinal  strain is abnormal.   3. Right ventricular systolic function is normal. The right ventricular  size is mildly enlarged. There is mildly elevated pulmonary artery  systolic  pressure.   4. Left atrial size was severely dilated.   5. Right atrial size was moderately dilated.   6. The mitral valve is normal in structure. Mild mitral valve  regurgitation. No evidence of mitral stenosis.   7. Tricuspid valve regurgitation is moderate.   8. The aortic valve is tricuspid. Aortic valve regurgitation is moderate  to severe. Mild aortic valve sclerosis is present, with no evidence of  aortic valve stenosis.   9. The inferior vena cava is  normal in size with greater than 50%  respiratory variability, suggesting right atrial pressure of 3 mmHg.     TTE 12/25/15 - Left ventricle: The cavity size was normal. Systolic function was   normal. The estimated ejection fraction was in the range of 55%   to 60%. Wall motion was normal; there were no regional wall   motion abnormalities. The study is not technically sufficient to   allow evaluation of LV diastolic function. - Aortic valve: Transvalvular velocity was within the normal range.   There was no stenosis. There was mild to moderate regurgitation. - Mitral valve: There was no regurgitation. - Left atrium: The atrium was severely dilated. - Right ventricle: The cavity size was normal. Wall thickness was   normal. Systolic function was normal. - Right atrium: The atrium was severely dilated. - Tricuspid valve: There was mild regurgitation. - Pulmonary arteries: Systolic pressure was within the normal   range. PA peak pressure: 23 mm Hg (S).     Cardiac cath 12/26/15  Prox RCA to Dist RCA lesion, 30% stenosed. Prox LAD lesion, 40% stenosed. Mid LAD to Dist LAD lesion, 45% stenosed. Ost Cx to Prox Cx lesion, 10% stenosed. The lesion was previously treated with a drug-eluting stent greater than two years ago. The left ventricular systolic function is normal.     Recent Labs: 04/17/2021: B Natriuretic Peptide 186.3 06/23/2021: Magnesium 1.8 06/29/2021: TSH 0.473 10/26/2021: ALT 28; BUN 11; Creatinine, Ser 0.36;  Hemoglobin 12.3; Platelets 111; Potassium 3.4; Sodium 136  01/17/2021: Cholesterol 116; HDL 63.70; LDL Cholesterol 43; Total CHOL/HDL Ratio 2; Triglycerides 49.0; VLDL 9.8   CrCl cannot be calculated (Unknown ideal weight.).   Wt Readings from Last 3 Encounters:  09/04/21 99 lb (44.9 kg)  09/04/21 99 lb 3.2 oz (45 kg)  07/19/21 93 lb 9.6 oz (42.5 kg)     Other studies reviewed: Additional studies/records reviewed today include: summarized above  ASSESSMENT AND PLAN:  PPM The patient's daughter says they would want to pursue gen change given fairly minimal procedure ERI 10/11/21  We discussed the generator change procedure, potential risks and benefits, they are both agreeable to proceed  Permanent AFib CHA2DS2Vasc is 6 No longer on a/c 2/2 some bleeding on her brain or brain mets  CAD Chronic sounding diffuse chest discomfort  HTN Looks ok, has tended to be on the low side of late  5. Severe CM Suspect 2/2 chemo She does not appear volume OL BP will limit management Her ARB has been previously stopped and her coreg reduced 2/2 low BPs     Disposition: PPM gen change planned  ,usual follow up  Current medicines are reviewed at length with the patient today.  The patient did not have any concerns regarding medicines.  Venetia Night, PA-C 11/06/2021 12:58 PM     [address] [place] [address] [phone] (office)  [phone] (fax)

## 2021-11-06 NOTE — H&P (View-Only) (Signed)
Cardiology Office Note Date:  11/06/2021  Patient ID:  Mahkayla, Preece April 07, 1935, MRN [id number] PCP:  [name], [name], MD  Cardiologist:  Dr. Tamala Julian Electrophysiologist: Dr .Curt Bears     Chief Complaint:  planned f/u  History of Present Illness: Elainna Eshleman is a 87 y.o. female with history of CAD (remote PCI to RCA, LCx), COPD, AFib (not on a/c it seems due to some bleeding noted on her CT of the head), tachy/brady w/PPM, HTN, HLD, DM. Lung cancer with mets to brain, bone, lymphadenopathy Chronic diarrhea  She comes today to be seen for Dr. Curt Bears, last seen by him June 2022.  Discussed that previously with d/w the patient, planned for rate control strategy. Was pending start of radiation therapy with hx of lung cancer and new spot found on her brain. No changes were made.  She had a hospital stay in September, went initially for generalized weakness, cough, chills, nausea, 1 episode of vomiting, and watery diarrhea for 2 weeks, labs quite abnormal including marked hypokalemia, low mag, she was admitted EKG was abnormal  and cardiology consulted, HS Trop abn Echo noted marked reduction in her LVEF, this suspect 2/2 her chemo Ultimately given metastatic cancer, comorbid conditions/constellation of medical issues not felt a candidate for ischemic w/u and recommended traetment towards QOL rather then aggressive interventions  I saw her 08/18/21 She is accompanied by her daughter that she lives with Since her hospitalization the patient has decided no longer to pursue chemo or further management of her cancer She is under Hospice care, still getting her med, though no plans for any aggressive/invasive care She is ambulatory to some degree She denies any CP, has some pain right shoulder blade > the left She has poor appetite and not eating well, recently started on steroid which has helped her oral intake and is more animated as well. No near syncope or syncope She is off a/c, her  daughter says that this is indeed 2/2 prior bleeding found in her head She was off her ARB 2/2 low BP and BB had been reduced, limiting GDMT No plans for a/c given metastatic lesion. They wanted to pursue gen change when needed.  TODAY She is again accompanied by her daughter She is doing about the same, very sedentary, can ambulate some, but pretty weak The pt tells me she is tired and hungry!  She reports some CP, it is a little hard seems to be her whole chest that aches, "all the time", in d/w he daughter sounds like this is a chronic pain, that has been discussed and given her cancer, uncertain the cause, perhaps the tumor. We discussed toady that the pan remains the same, ongoing medications, and medical management, no aggressive measures, but they do want to plan for gen change when the time came. They deny any near syncope or syncope No perception of palpitations    Device information [employer]Sci dual chamber PPM last gen change, 10/30/2013 RA, guidant 04/13/2015 RV lead, intermedics, 03/10/1998   AAD Multaq, intolerant of BID dosing with GI side effects   Past Medical History:  Diagnosis Date   Anemia    CAD (coronary artery disease), native coronary artery    PTCA of distal right coronary artery 1997 Cypher stents to circumflex 2005 Cardiac cath in 2011 with patent stent to circumflex and moderate disease elsewhere treated medically    Cardiac pacemaker in situ 12/24/2015   Original implant reportedly in 1991 for tachybradycardia syndrome, generator change in 1999, 2004  and evidently again in 2016 in [place]    Chronic systolic CHF (congestive heart failure) (HCC)    COPD (chronic obstructive pulmonary disease) ([place])    Diabetes mellitus without complication ([place])    Hypertension    Hypothyroidism 03/23/2010   Lung cancer ([place])    Pacemaker    Permanent atrial fibrillation ([place]) 03/23/2010   Prolonged Q-T interval on ECG     Past Surgical History:  Procedure Laterality  Date   BIOPSY  01/08/2020   Procedure: BIOPSY;  Surgeon: Garner Nash, DO;  Location: [facility];  Service: Pulmonary;;   BRONCHIAL BRUSHINGS  01/08/2020   Procedure: BRONCHIAL BRUSHINGS;  Surgeon: Garner Nash, DO;  Location: [facility];  Service: Pulmonary;;   BRONCHIAL WASHINGS  01/08/2020   Procedure: BRONCHIAL WASHINGS;  Surgeon: Garner Nash, DO;  Location: [facility];  Service: Pulmonary;;   CARDIAC CATHETERIZATION N/A 12/26/2015   Procedure: Left Heart Cath and Coronary Angiography;  Surgeon: Peter M [name], MD;  Location: [facility];  Service: Cardiovascular;  Laterality: N/A;   CARDIOVERSION  05/2015   CHOLECYSTECTOMY  2012   CORONARY ANGIOPLASTY WITH STENT PLACEMENT  1990   ENDOBRONCHIAL ULTRASOUND  01/08/2020   Procedure: ENDOBRONCHIAL ULTRASOUND;  Surgeon: Garner Nash, DO;  Location: [facility];  Service: Pulmonary;;   FINE NEEDLE ASPIRATION BIOPSY  01/08/2020   Procedure: FINE NEEDLE ASPIRATION BIOPSY;  Surgeon: Garner Nash, DO;  Location: [facility];  Service: Pulmonary;;   HERNIA REPAIR     IR IMAGING GUIDED PORT INSERTION  02/18/2020   PACEMAKER GENERATOR CHANGE  [id number], 2016   PACEMAKER INSERTION  1991   VIDEO BRONCHOSCOPY WITH ENDOBRONCHIAL ULTRASOUND N/A 01/08/2020   Procedure: VIDEO BRONCHOSCOPY;  Surgeon: Garner Nash, DO;  Location: [facility];  Service: Pulmonary;  Laterality: N/A;    Current Outpatient Medications  Medication Sig Dispense Refill   acetaminophen (TYLENOL) 500 MG tablet Take 500 mg by mouth every 6 (six) hours as needed for headache.     carvedilol (COREG) 12.5 MG tablet Take 1 tablet (12.5 mg total) by mouth 2 (two) times daily with a meal. 180 tablet 3   dexamethasone (DECADRON) 2 MG tablet Take 2 mg by mouth daily.     dicyclomine (BENTYL) 10 MG capsule Take 1 capsule (10 mg total) by mouth 4 (four) times daily routinely for abdominal cramping. 120 capsule 1   feeding supplement (BOOST / RESOURCE BREEZE) LIQD  Take 1 Container by mouth 3 (three) times daily between meals. (Patient not taking: Reported on 09/04/2021)     glucose blood (ONETOUCH VERIO) test strip Use to test 3-4 times daily. 300 strip 4   ipratropium-albuterol (DUONEB) 0.5-2.5 (3) MG/3ML SOLN Take 1.5 mLs by nebulization 3 (three) times daily as needed. 360 mL 1   levothyroxine (SYNTHROID) 112 MCG tablet TAKE 1 TABLET BY MOUTH DAILY BEFORE BREAKFAST. 90 tablet 2   linagliptin (TRADJENTA) 5 MG TABS tablet Take 1 tablet (5 mg total) by mouth daily. 90 tablet 1   Loperamide HCl (IMODIUM PO) Take by mouth as needed.     morphine 10 MG/5ML solution Take by mouth every 2 (two) hours as needed for severe pain. PRN     Multiple Vitamin (MULTIVITAMIN WITH MINERALS) TABS tablet Take 1 tablet by mouth daily.     nitroGLYCERIN (NITROSTAT) 0.4 MG SL tablet Place 1 tablet (0.4 mg total) under the tongue every 5 (five) minutes x 3 doses as needed for chest pain (Max 3  doses within 15 min. Call 911). 10 tablet 0   oxyCODONE-acetaminophen (PERCOCET) 10-325 MG tablet Take 0.5-1 tablets by mouth every 12 (twelve) hours as needed for pain. 30 tablet 0   pantoprazole (PROTONIX) 40 MG tablet TAKE 1 TABLET BY MOUTH EVERY DAY (Patient taking differently: Take 40 mg by mouth daily.) 90 tablet 3   potassium chloride SA (KLOR-CON M20) 20 MEQ tablet Take 1 tablet (20 mEq total) by mouth daily. 30 tablet 2   prochlorperazine (COMPAZINE) 10 MG tablet Take 1 tablet (10 mg total) by mouth every 6 (six) hours as needed for nausea or vomiting. 30 tablet 1   Spacer/Aero-Holding Chambers (AEROCHAMBER PLUS) inhaler Use as instructed with inahaler. 1 each 1   No current facility-administered medications for this visit.    Allergies:   Seasonal ic [cholestatin]   Social History:  The patient  reports that she has been smoking cigarettes. She has a 16.50 pack-year smoking history. She has never used smokeless tobacco. She reports that she does not drink alcohol and does not use  drugs.   Family History:  The patient's family history includes Cirrhosis in her brother; Heart attack in her mother; Heart disease in her father and mother.  ROS:  Please see the history of present illness.    All other systems are reviewed and otherwise negative.   PHYSICAL EXAM:  VS:  LMP  (LMP Unknown)  BMI: There is no height or weight on file to calculate BMI. Well nourished, well developed, in no acute distress, chronically ill appearing HEENT: normocephalic, atraumatic Neck: no JVD, carotid bruits or masses Cardiac:  RRR; no significant murmurs, no rubs, or gallops Lungs: CTA b/l, no wheezing, rhonchi or rales Abd: soft, nontender MS: no deformity, advanced atrophy Ext:  she has some swelling on the dorsum  of her R  foot, no erythema, pain, heat, Skin: warm and dry, no rash Neuro:  No gross deficits appreciated Psych: euthymic mood, full affect  PPM site is stable, no tethering or discomfort   EKG:  done today and reviewed by myself Afib w/both VS and VP beats, 62bpm  Device interrogation done today and reviewed by myself:  Battery reached ERI on 1//11/23 Lead measurements are good She is in Afib , mostly VP with some VS as well NSVT episodes, available EGMs reviewed are brief RVR     echo 06/22/21  1. Global hypokinesis with akinesis of the distal anteroseptal wall, apex  and distal inferior wall; overall severe LV dysfunction. LV function worse  compared to previous.   2. Left ventricular ejection fraction, by estimation, is 25 to 30%. The  left ventricle has severely decreased function. The left ventricle  demonstrates global hypokinesis. Left ventricular diastolic parameters are  indeterminate. The average left  ventricular global longitudinal strain is -7.0 %. The global longitudinal  strain is abnormal.   3. Right ventricular systolic function is normal. The right ventricular  size is mildly enlarged. There is mildly elevated pulmonary artery  systolic  pressure.   4. Left atrial size was severely dilated.   5. Right atrial size was moderately dilated.   6. The mitral valve is normal in structure. Mild mitral valve  regurgitation. No evidence of mitral stenosis.   7. Tricuspid valve regurgitation is moderate.   8. The aortic valve is tricuspid. Aortic valve regurgitation is moderate  to severe. Mild aortic valve sclerosis is present, with no evidence of  aortic valve stenosis.   9. The inferior vena cava is  normal in size with greater than 50%  respiratory variability, suggesting right atrial pressure of 3 mmHg.     TTE 12/25/15 - Left ventricle: The cavity size was normal. Systolic function was   normal. The estimated ejection fraction was in the range of 55%   to 60%. Wall motion was normal; there were no regional wall   motion abnormalities. The study is not technically sufficient to   allow evaluation of LV diastolic function. - Aortic valve: Transvalvular velocity was within the normal range.   There was no stenosis. There was mild to moderate regurgitation. - Mitral valve: There was no regurgitation. - Left atrium: The atrium was severely dilated. - Right ventricle: The cavity size was normal. Wall thickness was   normal. Systolic function was normal. - Right atrium: The atrium was severely dilated. - Tricuspid valve: There was mild regurgitation. - Pulmonary arteries: Systolic pressure was within the normal   range. PA peak pressure: 23 mm Hg (S).     Cardiac cath 12/26/15  Prox RCA to Dist RCA lesion, 30% stenosed. Prox LAD lesion, 40% stenosed. Mid LAD to Dist LAD lesion, 45% stenosed. Ost Cx to Prox Cx lesion, 10% stenosed. The lesion was previously treated with a drug-eluting stent greater than two years ago. The left ventricular systolic function is normal.     Recent Labs: 04/17/2021: B Natriuretic Peptide 186.3 06/23/2021: Magnesium 1.8 06/29/2021: TSH 0.473 10/26/2021: ALT 28; BUN 11; Creatinine, Ser 0.36;  Hemoglobin 12.3; Platelets 111; Potassium 3.4; Sodium 136  01/17/2021: Cholesterol 116; HDL 63.70; LDL Cholesterol 43; Total CHOL/HDL Ratio 2; Triglycerides 49.0; VLDL 9.8   CrCl cannot be calculated (Unknown ideal weight.).   Wt Readings from Last 3 Encounters:  09/04/21 99 lb (44.9 kg)  09/04/21 99 lb 3.2 oz (45 kg)  07/19/21 93 lb 9.6 oz (42.5 kg)     Other studies reviewed: Additional studies/records reviewed today include: summarized above  ASSESSMENT AND PLAN:  PPM The patient's daughter says they would want to pursue gen change given fairly minimal procedure ERI 10/11/21  We discussed the generator change procedure, potential risks and benefits, they are both agreeable to proceed  Permanent AFib CHA2DS2Vasc is 6 No longer on a/c 2/2 some bleeding on her brain or brain mets  CAD Chronic sounding diffuse chest discomfort  HTN Looks ok, has tended to be on the low side of late  5. Severe CM Suspect 2/2 chemo She does not appear volume OL BP will limit management Her ARB has been previously stopped and her coreg reduced 2/2 low BPs     Disposition: PPM gen change planned  ,usual follow up  Current medicines are reviewed at length with the patient today.  The patient did not have any concerns regarding medicines.  Venetia Night, PA-C 11/06/2021 12:58 PM     [address] [place] [address] [phone] (office)  [phone] (fax)

## 2021-11-08 ENCOUNTER — Ambulatory Visit (INDEPENDENT_AMBULATORY_CARE_PROVIDER_SITE_OTHER): Payer: Medicare Other

## 2021-11-08 DIAGNOSIS — I428 Other cardiomyopathies: Secondary | ICD-10-CM

## 2021-11-08 LAB — CUP PACEART REMOTE DEVICE CHECK
Brady Statistic RA Percent Paced: 0 %
Brady Statistic RV Percent Paced: 48 %
Date Time Interrogation Session: 20230208021100
Implantable Lead Implant Date: 19990610
Implantable Lead Implant Date: 20160713
Implantable Lead Location: 753859
Implantable Lead Location: 753860
Implantable Lead Model: 4136
Implantable Lead Serial Number: 29813363
Implantable Pulse Generator Implant Date: 20150130
Lead Channel Impedance Value: 432 Ohm
Lead Channel Impedance Value: 570 Ohm
Lead Channel Pacing Threshold Amplitude: 0.9 V
Lead Channel Pacing Threshold Amplitude: 1.1 V
Lead Channel Pacing Threshold Pulse Width: 0.4 ms
Lead Channel Pacing Threshold Pulse Width: 0.5 ms
Lead Channel Setting Pacing Amplitude: 1.4 V
Lead Channel Setting Pacing Amplitude: 2 V
Lead Channel Setting Pacing Pulse Width: 0.4 ms
Lead Channel Setting Sensing Sensitivity: 2 mV
Pulse Gen Serial Number: 393477

## 2021-11-09 ENCOUNTER — Ambulatory Visit (INDEPENDENT_AMBULATORY_CARE_PROVIDER_SITE_OTHER): Admitting: Physician Assistant

## 2021-11-09 ENCOUNTER — Encounter: Payer: Self-pay | Admitting: Physician Assistant

## 2021-11-09 ENCOUNTER — Encounter: Payer: Self-pay | Admitting: *Deleted

## 2021-11-09 ENCOUNTER — Other Ambulatory Visit: Payer: Self-pay

## 2021-11-09 VITALS — BP 106/58 | HR 60 | Ht 62.0 in | Wt 94.0 lb

## 2021-11-09 DIAGNOSIS — I420 Dilated cardiomyopathy: Secondary | ICD-10-CM

## 2021-11-09 DIAGNOSIS — Z950 Presence of cardiac pacemaker: Secondary | ICD-10-CM

## 2021-11-09 DIAGNOSIS — Z01810 Encounter for preprocedural cardiovascular examination: Secondary | ICD-10-CM | POA: Diagnosis not present

## 2021-11-09 DIAGNOSIS — Z45010 Encounter for checking and testing of cardiac pacemaker pulse generator [battery]: Secondary | ICD-10-CM

## 2021-11-09 DIAGNOSIS — R55 Syncope and collapse: Secondary | ICD-10-CM

## 2021-11-09 DIAGNOSIS — R9431 Abnormal electrocardiogram [ECG] [EKG]: Secondary | ICD-10-CM | POA: Diagnosis not present

## 2021-11-09 DIAGNOSIS — I4821 Permanent atrial fibrillation: Secondary | ICD-10-CM | POA: Diagnosis not present

## 2021-11-09 DIAGNOSIS — I2510 Atherosclerotic heart disease of native coronary artery without angina pectoris: Secondary | ICD-10-CM

## 2021-11-09 LAB — CUP PACEART INCLINIC DEVICE CHECK
Date Time Interrogation Session: 20230209184148
Implantable Lead Implant Date: 19990610
Implantable Lead Implant Date: 20160713
Implantable Lead Location: 753859
Implantable Lead Location: 753860
Implantable Lead Model: 4136
Implantable Lead Serial Number: 29813363
Implantable Pulse Generator Implant Date: 20150130
Lead Channel Impedance Value: 447 Ohm
Lead Channel Impedance Value: 572 Ohm
Lead Channel Pacing Threshold Amplitude: 0.9 V
Lead Channel Pacing Threshold Amplitude: 1.1 V
Lead Channel Pacing Threshold Pulse Width: 0.4 ms
Lead Channel Pacing Threshold Pulse Width: 0.5 ms
Lead Channel Sensing Intrinsic Amplitude: 0.7 mV
Lead Channel Sensing Intrinsic Amplitude: 6.2 mV
Lead Channel Setting Pacing Amplitude: 1.5 V
Lead Channel Setting Pacing Amplitude: 2 V
Lead Channel Setting Pacing Pulse Width: 0.4 ms
Lead Channel Setting Sensing Sensitivity: 2 mV
Pulse Gen Serial Number: 393477

## 2021-11-09 NOTE — Patient Instructions (Addendum)
Medication Instructions:   Your physician recommends that you continue on your current medications as directed. Please refer to the Current Medication list given to you today.  . *If you need a refill on your cardiac medications before your next appointment, please call your pharmacy*   Lab Work: BMET AND CBC TODAY    If you have labs (blood work) drawn today and your tests are completely normal, you will receive your results only by: [place] (if you have MyChart) OR A paper copy in the mail If you have any lab test that is abnormal or we need to change your treatment, we will call you to review the results.   Testing/Procedures:  SEE  ATTACHED LETTER FOR DEVICE BATTERY CHANGE OUT ON 12-01-21 WITH DR CAMNITZ     Follow-Up: At [facility], you and your health needs are our priority.  As part of our continuing mission to provide you with exceptional heart care, we have created designated Provider Care Teams.  These Care Teams include your primary Cardiologist (physician) and Advanced Practice Providers (APPs -  Physician Assistants and Nurse Practitioners) who all work together to provide you with the care you need, when you need it.  We recommend signing up for the patient portal called "MyChart".  Sign up information is provided on this After Visit Summary.  MyChart is used to connect with patients for Virtual Visits (Telemedicine).  Patients are able to view lab/test results, encounter notes, upcoming appointments, etc.  Non-urgent messages can be sent to your provider as well.   To learn more about what you can do with MyChart, go to [email or web address].    Your next appointment:  AFTER 12-01-21  10-14 DAY WOUND CHECK VISIT                                               AND    3 month(s)  The format for your next appointment:   In Person  Provider:   You may see Will Meredith Leeds, MD  Phys Pacer Chk   Other Instructions

## 2021-11-10 ENCOUNTER — Encounter: Payer: Self-pay | Admitting: Internal Medicine

## 2021-11-10 LAB — BASIC METABOLIC PANEL
BUN/Creatinine Ratio: 22 (ref 12–28)
BUN: 11 mg/dL (ref 8–27)
CO2: 23 mmol/L (ref 20–29)
Calcium: 9.1 mg/dL (ref 8.7–10.3)
Chloride: 111 mmol/L — ABNORMAL HIGH (ref 96–106)
Creatinine, Ser: 0.49 mg/dL — ABNORMAL LOW (ref 0.57–1.00)
Glucose: 72 mg/dL (ref 70–99)
Potassium: 3.4 mmol/L — ABNORMAL LOW (ref 3.5–5.2)
Sodium: 149 mmol/L — ABNORMAL HIGH (ref 134–144)
eGFR: 93 mL/min/{1.73_m2} (ref >59)

## 2021-11-10 LAB — CBC
Hematocrit: 35.6 % (ref 34.0–46.6)
Hemoglobin: 12 g/dL (ref 11.1–15.9)
MCH: 30.4 pg (ref 26.6–33.0)
MCHC: 33.7 g/dL (ref 31.5–35.7)
MCV: 90 fL (ref 79–97)
Platelets: 144 10*3/uL — ABNORMAL LOW (ref 150–450)
RBC: 3.95 x10E6/uL (ref 3.77–5.28)
RDW: 13.3 % (ref 11.7–15.4)
WBC: 5.2 10*3/uL (ref 3.4–10.8)

## 2021-11-13 NOTE — Progress Notes (Signed)
Remote pacemaker transmission.   

## 2021-11-15 ENCOUNTER — Telehealth: Payer: Self-pay | Admitting: *Deleted

## 2021-11-15 DIAGNOSIS — E876 Hypokalemia: Secondary | ICD-10-CM

## 2021-11-15 MED ORDER — POTASSIUM CHLORIDE CRYS ER 20 MEQ PO TBCR: 30.0000 meq | EXTENDED_RELEASE_TABLET | Freq: Every day | ORAL | 2 refills | Status: DC

## 2021-11-15 NOTE — Telephone Encounter (Signed)
Spoke with patient Dtr and patient is currently Taking Potassium 20 Meq once a day. Patient Dtr aware to increase to Tablet and half which is 30 meq daily with lab redraw that day of procedure.

## 2021-11-17 ENCOUNTER — Telehealth: Payer: Self-pay

## 2021-11-17 NOTE — Telephone Encounter (Signed)
-----   Message from [facility], [place] sent at 11/14/2021  1:50 PM EST ----- Would you mind helping and please following up on this. Thank you

## 2021-11-17 NOTE — Telephone Encounter (Signed)
Spoke with pt's daughter, Brenton Grills and advised of Carolan Clines comments as below.  Pt's daughter states pt has been taking Klor-Con 20mg  - 1 tablet by mouth daily.  Advised pt will need to increase dose to 30mg  daily (1.5 tablets) daily and will repeat labs at her generator change.  Pt's daughter verbalizes understanding and thanked Therapist, sports for the call.  Baldwin Jamaica, PA-C  11/10/2021  1:55 PM EST Labs look pretty good, though her potassium is a little low, seems it has been.  Can you confirm with her daughter she is taking the 66meq daily?  If she is lets increase to 30 daily and plan order repeat BMET at her gen change procedure please.

## 2021-11-21 ENCOUNTER — Telehealth: Payer: Self-pay | Admitting: *Deleted

## 2021-11-21 NOTE — Telephone Encounter (Signed)
Advised to arrive at 11:30 am next Friday, 3/3, for procedure. Dtr verbalized understanding and agreeable to plan.

## 2021-11-24 ENCOUNTER — Encounter: Payer: Self-pay | Admitting: Internal Medicine

## 2021-11-30 NOTE — Pre-Procedure Instructions (Signed)
Attempted to call patient regarding procedure instructions.  Patient was asleep.  Tried to call Daughter, no voice mail set up. ?

## 2021-12-01 ENCOUNTER — Other Ambulatory Visit: Payer: Self-pay

## 2021-12-01 ENCOUNTER — Ambulatory Visit (HOSPITAL_COMMUNITY)
Admission: RE | Admit: 2021-12-01 | Discharge: 2021-12-01 | Disposition: A | Discharge status: Home / Self Care | Attending: Cardiology | Admitting: Cardiology

## 2021-12-01 ENCOUNTER — Encounter (HOSPITAL_COMMUNITY)
Admission: RE | Disposition: A | Discharge status: Home / Self Care | Payer: Self-pay | Source: Home / Self Care | Attending: Cardiology

## 2021-12-01 DIAGNOSIS — R591 Generalized enlarged lymph nodes: Secondary | ICD-10-CM | POA: Diagnosis not present

## 2021-12-01 DIAGNOSIS — E039 Hypothyroidism, unspecified: Secondary | ICD-10-CM | POA: Insufficient documentation

## 2021-12-01 DIAGNOSIS — F17210 Nicotine dependence, cigarettes, uncomplicated: Secondary | ICD-10-CM | POA: Diagnosis not present

## 2021-12-01 DIAGNOSIS — Z79899 Other long term (current) drug therapy: Secondary | ICD-10-CM | POA: Diagnosis not present

## 2021-12-01 DIAGNOSIS — I110 Hypertensive heart disease with heart failure: Secondary | ICD-10-CM | POA: Insufficient documentation

## 2021-12-01 DIAGNOSIS — E785 Hyperlipidemia, unspecified: Secondary | ICD-10-CM | POA: Diagnosis not present

## 2021-12-01 DIAGNOSIS — C3490 Malignant neoplasm of unspecified part of unspecified bronchus or lung: Secondary | ICD-10-CM | POA: Insufficient documentation

## 2021-12-01 DIAGNOSIS — Z955 Presence of coronary angioplasty implant and graft: Secondary | ICD-10-CM | POA: Insufficient documentation

## 2021-12-01 DIAGNOSIS — K529 Noninfective gastroenteritis and colitis, unspecified: Secondary | ICD-10-CM | POA: Insufficient documentation

## 2021-12-01 DIAGNOSIS — Z950 Presence of cardiac pacemaker: Secondary | ICD-10-CM

## 2021-12-01 DIAGNOSIS — R001 Bradycardia, unspecified: Secondary | ICD-10-CM | POA: Diagnosis not present

## 2021-12-01 DIAGNOSIS — I4821 Permanent atrial fibrillation: Secondary | ICD-10-CM | POA: Insufficient documentation

## 2021-12-01 DIAGNOSIS — E876 Hypokalemia: Secondary | ICD-10-CM

## 2021-12-01 DIAGNOSIS — I495 Sick sinus syndrome: Secondary | ICD-10-CM | POA: Insufficient documentation

## 2021-12-01 DIAGNOSIS — I5022 Chronic systolic (congestive) heart failure: Secondary | ICD-10-CM | POA: Diagnosis not present

## 2021-12-01 DIAGNOSIS — I2510 Atherosclerotic heart disease of native coronary artery without angina pectoris: Secondary | ICD-10-CM | POA: Diagnosis not present

## 2021-12-01 DIAGNOSIS — Z45010 Encounter for checking and testing of cardiac pacemaker pulse generator [battery]: Secondary | ICD-10-CM

## 2021-12-01 DIAGNOSIS — Z7984 Long term (current) use of oral hypoglycemic drugs: Secondary | ICD-10-CM | POA: Diagnosis not present

## 2021-12-01 DIAGNOSIS — E119 Type 2 diabetes mellitus without complications: Secondary | ICD-10-CM | POA: Insufficient documentation

## 2021-12-01 DIAGNOSIS — C7931 Secondary malignant neoplasm of brain: Secondary | ICD-10-CM | POA: Insufficient documentation

## 2021-12-01 DIAGNOSIS — C7951 Secondary malignant neoplasm of bone: Secondary | ICD-10-CM | POA: Insufficient documentation

## 2021-12-01 DIAGNOSIS — J449 Chronic obstructive pulmonary disease, unspecified: Secondary | ICD-10-CM | POA: Insufficient documentation

## 2021-12-01 HISTORY — PX: PPM GENERATOR CHANGEOUT: EP1233

## 2021-12-01 LAB — GLUCOSE, CAPILLARY
Glucose-Capillary: 94 mg/dL (ref 70–99)
Glucose-Capillary: 107 mg/dL — ABNORMAL HIGH (ref 70–99)

## 2021-12-01 SURGERY — PPM GENERATOR CHANGEOUT

## 2021-12-01 MED ORDER — CHLORHEXIDINE GLUCONATE 4 % EX LIQD: 4.0000 "application " | Freq: Once | CUTANEOUS | Status: DC

## 2021-12-01 MED ORDER — ONDANSETRON HCL 4 MG/2ML IJ SOLN: 4.0000 mg | Freq: Four times a day (QID) | INTRAMUSCULAR | Status: DC | PRN

## 2021-12-01 MED ORDER — CEFAZOLIN SODIUM-DEXTROSE 2-4 GM/100ML-% IV SOLN
2.0000 g | INTRAVENOUS | Status: AC
  Administered 2021-12-01: 2 g via INTRAVENOUS

## 2021-12-01 MED ORDER — SODIUM CHLORIDE 0.9 % IV SOLN
80.0000 mg | INTRAVENOUS | Status: AC
  Administered 2021-12-01: 80 mg

## 2021-12-01 MED ORDER — LIDOCAINE HCL (PF) 1 % IJ SOLN
INTRAMUSCULAR | Status: DC | PRN
  Administered 2021-12-01: 40 mL

## 2021-12-01 MED ORDER — ACETAMINOPHEN 325 MG PO TABS
325.0000 mg | ORAL_TABLET | ORAL | Status: DC | PRN
  Filled 2021-12-01: qty 2

## 2021-12-01 MED ORDER — LIDOCAINE HCL (PF) 1 % IJ SOLN
INTRAMUSCULAR | Status: AC
  Filled 2021-12-01: qty 60

## 2021-12-01 MED ORDER — CEFAZOLIN SODIUM-DEXTROSE 2-4 GM/100ML-% IV SOLN
INTRAVENOUS | Status: AC
  Filled 2021-12-01: qty 100

## 2021-12-01 MED ORDER — SODIUM CHLORIDE 0.9 % IV SOLN
INTRAVENOUS | Status: AC
  Filled 2021-12-01: qty 2

## 2021-12-01 MED ORDER — SODIUM CHLORIDE 0.9 % IV SOLN: INTRAVENOUS | Status: DC

## 2021-12-01 SURGICAL SUPPLY — 4 items
CABLE SURGICAL S-101-97-12 (CABLE) ×2 IMPLANT
PACEMAKER ACCOLADE DR DUAL (Pacemaker) ×1 IMPLANT
PAD DEFIB RADIO PHYSIO CONN (PAD) ×2 IMPLANT
TRAY PACEMAKER INSERTION (PACKS) ×2 IMPLANT

## 2021-12-01 NOTE — Discharge Instructions (Addendum)
Implantable Cardiac Device Battery Change, Care After ? ?This sheet gives you information about how to care for yourself after your procedure. Your health care provider may also give you more specific instructions. If you have problems or questions, contact your health care provider. ?What can I expect after the procedure? ?After your procedure, it is common to have: ?Pain or soreness at the site where the cardiac device was inserted. ?Swelling at the site where the cardiac device was inserted. ?You should received an information card for your new device in 4-8 weeks. ?Follow these instructions at home: ?Incision care  ?Keep the incision clean and dry. ?Do not take baths, swim, or use a hot tub until after your wound check.  ?Do not shower till seen by your health care provider. Wound check 3/15 ?Pat the area dry with a clean towel. Do not rub the area. This may cause bleeding. ?Follow instructions from your health care provider about how to take care of your incision. Make sure you: ?Leave stitches (sutures), skin glue, or adhesive strips in place. These skin closures may need to stay in place for 2 weeks or longer. If adhesive strip edges start to loosen and curl up, you may trim the loose edges. Do not remove adhesive strips completely unless your health care provider tells you to do that. ?Check your incision area every day for signs of infection. Check for: ?More redness, swelling, or pain. ?More fluid or blood. ?Warmth. ?Pus or a bad smell. ?Activity ?Do not lift anything that is heavier than 10 lb (4.5 kg) until your health care provider says it is okay to do so. ?For the first week, or as long as told by your health care provider: ?Avoid lifting your affected arm higher than your shoulder. ?After 1 week, Be gentle when you move your arms over your head. It is okay to raise your arm to comb your hair. ?Avoid strenuous exercise. ?Ask your health care provider when it is okay to: ?Resume your normal  activities. ?Return to work or school. ?Resume sexual activity. ?Eating and drinking ?Eat a heart-healthy diet. This should include plenty of fresh fruits and vegetables, whole grains, low-fat dairy products, and lean protein like chicken and fish. ?Limit alcohol intake to no more than 1 drink a day for non-pregnant women and 2 drinks a day for men. One drink equals 12 oz of beer, 5 oz of wine, or 1? oz of hard liquor. ?Check ingredients and nutrition facts on packaged foods and beverages. Avoid the following types of food: ?Food that is high in salt (sodium). ?Food that is high in saturated fat, like full-fat dairy or red meat. ?Food that is high in trans fat, like fried food. ?Food and drinks that are high in sugar. ?Lifestyle ?Do not use any products that contain nicotine or tobacco, such as cigarettes and e-cigarettes. If you need help quitting, ask your health care provider. ?Take steps to manage and control your weight. ?Once cleared, get regular exercise. Aim for 150 minutes of moderate-intensity exercise (such as walking or yoga) or 75 minutes of vigorous exercise (such as running or swimming) each week. ?Manage other health problems, such as diabetes or high blood pressure. Ask your health care provider how you can manage these conditions. ?General instructions ?Do not drive for 24 hours after your procedure if you were given a medicine to help you relax (sedative). ?Take over-the-counter and prescription medicines only as told by your health care provider. ?Avoid putting pressure on the area  where the cardiac device was placed. ?If you need an MRI after your cardiac device has been placed, be sure to tell the health care provider who orders the MRI that you have a cardiac device. ?Avoid close and prolonged exposure to electrical devices that have strong magnetic fields. These include: ?Cell phones. Avoid keeping them in a pocket near the cardiac device, and try using the ear opposite the cardiac  device. ?MP3 players. ?Household appliances, like microwaves. ?Metal detectors. ?Electric generators. ?High-tension wires. ?Keep all follow-up visits as directed by your health care provider. This is important. ?Contact a health care provider if: ?You have pain at the incision site that is not relieved by over-the-counter or prescription medicines. ?You have any of these around your incision site or coming from it: ?More redness, swelling, or pain. ?Fluid or blood. ?Warmth to the touch. ?Pus or a bad smell. ?You have a fever. ?You feel brief, occasional palpitations, light-headedness, or any symptoms that you think might be related to your heart. ?Get help right away if: ?You experience chest pain that is different from the pain at the cardiac device site. ?You develop a red streak that extends above or below the incision site. ?You experience shortness of breath. ?You have palpitations or an irregular heartbeat. ?You have light-headedness that does not go away quickly. ?You faint or have dizzy spells. ?Your pulse suddenly drops or increases rapidly and does not return to normal. ?You begin to gain weight and your legs and ankles swell. ?Summary ?After your procedure, it is common to have pain, soreness, and some swelling where the cardiac device was inserted. ?Make sure to keep your incision clean and dry. Follow instructions from your health care provider about how to take care of your incision. ?Check your incision every day for signs of infection, such as more pain or swelling, pus or a bad smell, warmth, or leaking fluid and blood. ?Avoid strenuous exercise and lifting your left arm higher than your shoulder for 2 weeks, or as long as told by your health care provider. ?This information is not intended to replace advice given to you by your health care provider. Make sure you discuss any questions you have with your health care provider.  ?

## 2021-12-01 NOTE — Interval H&P Note (Signed)
History and Physical Interval Note: ? ?12/01/2021 ?12:57 PM ? ?[name]  has presented today for surgery, with the diagnosis of ERI.  The various methods of treatment have been discussed with the patient and family. After consideration of risks, benefits and other options for treatment, the patient has consented to  Procedure(s): ?PPM GENERATOR CHANGEOUT (N/A) as a surgical intervention.  The patient's history has been reviewed, patient examined, no change in status, stable for surgery.  I have reviewed the patient's chart and labs.  Questions were answered to the patient's satisfaction.   ? ? ?Aara Jacquot Hassell Done Ceclia Koker ? ? ?

## 2021-12-04 ENCOUNTER — Encounter (HOSPITAL_COMMUNITY): Payer: Self-pay | Admitting: Cardiology

## 2021-12-04 NOTE — Progress Notes (Deleted)
Office Visit    Patient Name: [name] Date of Encounter: 12/04/2021  Primary Care Provider:  [name], [name], MD Primary Cardiologist:  [name], [facility] follow-up  History of Present Illness    [name] is a 87 y.o. female with a past medical history of CAD s/p cardiac catheterization 1997 with PTCA of distal RCA, 2011 DES to Ost Cx to Prox Cx lesion.  PPM, CHF, COPD, metastatic lung CA with mets to brain and bone under hospice care, A-fib not on AC due to bleeding in brain.  Hospital stay on 9/23 due to generalized weakness nausea and vomiting with watery diarrhea.  Echo completed during stay revealed EF 25-30% with global hypokinesis, due to metastatic cancer ischemic work-up was not recommended.  Beta-blocker was decreased due to hypotension and ARB was discontinued.    Recently seen 2/9 by EP and underwent generator change out 3/3.  There were no complications noted.  She presents today with her daughter***   Since{Blank single:19197::"last being seen in our clinic","discharge from hospital"} the patient reports doing ***.  she denies chest pain, palpitations, dyspnea, PND, orthopnea, nausea, vomiting, dizziness, syncope, edema, weight gain, or early satiety.     Past Medical History    Past Medical History:  Diagnosis Date   Anemia    CAD (coronary artery disease), native coronary artery    PTCA of distal right coronary artery 1997 Cypher stents to circumflex 2005 Cardiac cath in 2011 with patent stent to circumflex and moderate disease elsewhere treated medically    Cardiac pacemaker in situ 12/24/2015   Original implant reportedly in 1991 for tachybradycardia syndrome, generator change in 1999, 2004 and evidently again in 2016 in [place]    Chronic systolic CHF (congestive heart failure) (HCC)    COPD (chronic obstructive pulmonary disease) ([place])    Diabetes mellitus without complication ([place])    Hypertension     Hypothyroidism 03/23/2010   Lung cancer ([place])    Pacemaker    Permanent atrial fibrillation ([place]) 03/23/2010   Prolonged Q-T interval on ECG    Past Surgical History:  Procedure Laterality Date   BIOPSY  01/08/2020   Procedure: BIOPSY;  Surgeon: Garner Nash, DO;  Location: [facility];  Service: Pulmonary;;   BRONCHIAL BRUSHINGS  01/08/2020   Procedure: BRONCHIAL BRUSHINGS;  Surgeon: Garner Nash, DO;  Location: [facility];  Service: Pulmonary;;   BRONCHIAL WASHINGS  01/08/2020   Procedure: BRONCHIAL WASHINGS;  Surgeon: Garner Nash, DO;  Location: [facility];  Service: Pulmonary;;   CARDIAC CATHETERIZATION N/A 12/26/2015   Procedure: Left Heart Cath and Coronary Angiography;  Surgeon: Peter M [name], MD;  Location: [place];  Service: Cardiovascular;  Laterality: N/A;   CARDIOVERSION  05/2015   CHOLECYSTECTOMY  2012   CORONARY ANGIOPLASTY WITH STENT PLACEMENT  1990   ENDOBRONCHIAL ULTRASOUND  01/08/2020   Procedure: ENDOBRONCHIAL ULTRASOUND;  Surgeon: Garner Nash, DO;  Location: [facility];  Service: Pulmonary;;   FINE NEEDLE ASPIRATION BIOPSY  01/08/2020   Procedure: FINE NEEDLE ASPIRATION BIOPSY;  Surgeon: Garner Nash, DO;  Location: [facility];  Service: Pulmonary;;   HERNIA REPAIR     IR IMAGING GUIDED PORT INSERTION  02/18/2020   PACEMAKER GENERATOR CHANGE  [id number],1245, 2016   PACEMAKER INSERTION  1991   [place] N/A 12/01/2021   Procedure: PPM GENERATOR CHANGEOUT;  Surgeon: Constance Haw, MD;  Location: [facility];  Service: Cardiovascular;  Laterality: N/A;   VIDEO BRONCHOSCOPY WITH ENDOBRONCHIAL ULTRASOUND N/A 01/08/2020   Procedure: VIDEO BRONCHOSCOPY;  Surgeon: Garner Nash, DO;  Location: [facility];  Service: Pulmonary;  Laterality: N/A;    Allergies  Allergies  Allergen Reactions   Seasonal Ic [Cholestatin]     Itchy eye and runny nose    Home Medications    Current Outpatient Medications  Medication  Sig Dispense Refill   acetaminophen (TYLENOL) 500 MG tablet Take 500 mg by mouth every 6 (six) hours as needed for headache or moderate pain. Rapid release     carvedilol (COREG) 3.125 MG tablet Take 3.125 mg by mouth 2 (two) times daily.     dexamethasone (DECADRON) 2 MG tablet Take 2 mg by mouth daily.     dicyclomine (BENTYL) 10 MG capsule Take 1 capsule (10 mg total) by mouth 4 (four) times daily routinely for abdominal cramping. 120 capsule 1   glucose blood (ONETOUCH VERIO) test strip Use to test 3-4 times daily. 300 strip 4   ipratropium-albuterol (DUONEB) 0.5-2.5 (3) MG/3ML SOLN Take 1.5 mLs by nebulization 3 (three) times daily as needed. (Patient taking differently: Take 1.5 mLs by nebulization 3 (three) times daily as needed (Shortness of breath/COPD).) 360 mL 1   levothyroxine (SYNTHROID) 112 MCG tablet TAKE 1 TABLET BY MOUTH DAILY BEFORE BREAKFAST. 90 tablet 2   linagliptin (TRADJENTA) 5 MG TABS tablet Take 1 tablet (5 mg total) by mouth daily. (Patient not taking: Reported on 11/27/2021) 90 tablet 1   Loperamide HCl (IMODIUM PO) Take 1 tablet by mouth daily as needed (loose stool).     LORazepam (ATIVAN) 0.5 MG tablet Take 0.5 mg by mouth 2 (two) times daily as needed for anxiety (shortness of breath).     Multiple Vitamin (MULTIVITAMIN WITH MINERALS) TABS tablet Take 1 tablet by mouth daily.     nitroGLYCERIN (NITROSTAT) 0.4 MG SL tablet Place 1 tablet (0.4 mg total) under the tongue every 5 (five) minutes x 3 doses as needed for chest pain (Max 3 doses within 15 min. Call 911). 10 tablet 0   oxyCODONE-acetaminophen (PERCOCET) 10-325 MG tablet Take 0.5-1 tablets by mouth every 12 (twelve) hours as needed for pain. (Patient taking differently: Take 0.5 tablets by mouth every 12 (twelve) hours as needed for pain.) 30 tablet 0   pantoprazole (PROTONIX) 40 MG tablet TAKE 1 TABLET BY MOUTH EVERY DAY (Patient taking differently: Take 40 mg by mouth daily.) 90 tablet 3   potassium chloride SA  (KLOR-CON M20) 20 MEQ tablet Take 1.5 tablets (30 mEq total) by mouth daily. 30 tablet 2   PRESCRIPTION MEDICATION Take 0.25-0.5 mLs by mouth daily as needed (Shortness of breath/pain). MORPHINE SULF 100 MG/5 ML CONC     prochlorperazine (COMPAZINE) 10 MG tablet Take 1 tablet (10 mg total) by mouth every 6 (six) hours as needed for nausea or vomiting. 30 tablet 1   QUEtiapine (SEROQUEL) 25 MG tablet Take 12.5 mg by mouth 2 (two) times daily.     Spacer/Aero-Holding Chambers (AEROCHAMBER PLUS) inhaler Use as instructed with inahaler. 1 each 1   No current facility-administered medications for this visit.     Review of Systems  All other systems reviewed and are otherwise negative except as noted above.   Physical Exam    RX:[id number] were no vitals filed for this visit.   GEN: Well nourished, well developed, in no acute distress. Neck: Supple, no JVD, carotid bruits, or masses. Cardiac:  RRR, no murmurs, rubs, or gallops. No clubbing, cyanosis, edema.  Radials/PT 2+ and equal bilaterally.  Respiratory:  Respirations regular and unlabored, clear to auscultation bilaterally. MS: no deformity or atrophy. Skin: warm and dry, no rash. Neuro:  Strength and sensation are intact. Psych: Normal affect.  Accessory Clinical Findings    ECG personally reviewed by me today - *** rate of ***- no acute changes.  Lab Results  Component Value Date   WBC 5.2 11/09/2021   HGB 12.0 11/09/2021   HCT 35.6 11/09/2021   MCV 90 11/09/2021   PLT 144 (L) 11/09/2021   Lab Results  Component Value Date   CREATININE 0.49 (L) 11/09/2021   BUN 11 11/09/2021   NA 149 (H) 11/09/2021   K 3.4 (L) 11/09/2021   CL 111 (H) 11/09/2021   CO2 23 11/09/2021   Lab Results  Component Value Date   ALT 28 10/26/2021   AST 18 10/26/2021   ALKPHOS 54 10/26/2021   BILITOT 0.9 10/26/2021   Lab Results  Component Value Date   CHOL 116 01/17/2021   HDL 63.70 01/17/2021   LDLCALC 43 01/17/2021   TRIG 49.0 01/17/2021    CHOLHDL 2 01/17/2021    Lab Results  Component Value Date   HGBA1C 6.8 (H) 06/22/2021    Assessment & Plan    1.  CAD: -s/p cardiac catheterization 1997 with PTCA of distal RCA, 2011 DES to Ost Cx to Prox Cx lesion.  2019 with diffuse nonobstructive CAD -  2.  Permanent A-fib: -CHA2DS2-VASc score:6 No longer on AC due to bleeding on brain -PPM  3.  Hypertension: -Blood pressure today was*** -Continue Coreg 3.125 mg  4.   PPM: -s/p generator change out on 12/01/21 -[place] appears to be***  5. Chronic systolic CHF: -Last echo revealed EF of 25-30% -Continue Coreg 3.125 -GDMT was down titrated due to***  Disposition: Follow-up with Dr. Marland Kitchenor  APP*** in *** month/year  {Are you ordering a CV Procedure (e.g. stress test, cath, DCCV, TEE, etc)?   Press F2        :[id number]}   Medication Adjustments/Labs and Tests Ordered: Current medicines are reviewed at length with the patient today.  Concerns regarding medicines are outlined above.  Tests Ordered: No orders of the defined types were placed in this encounter.  Medication Changes: No orders of the defined types were placed in this encounter.    Mable Fill, Marissa Nestle, NP 12/04/2021, 6:57 PM

## 2021-12-05 ENCOUNTER — Ambulatory Visit: Payer: Medicare Other | Admitting: Nurse Practitioner

## 2021-12-06 ENCOUNTER — Encounter: Payer: Self-pay | Admitting: Internal Medicine

## 2021-12-13 ENCOUNTER — Ambulatory Visit (INDEPENDENT_AMBULATORY_CARE_PROVIDER_SITE_OTHER)

## 2021-12-13 ENCOUNTER — Other Ambulatory Visit: Payer: Self-pay

## 2021-12-13 DIAGNOSIS — R9431 Abnormal electrocardiogram [ECG] [EKG]: Secondary | ICD-10-CM

## 2021-12-13 LAB — CUP PACEART INCLINIC DEVICE CHECK
Date Time Interrogation Session: 20230315164331
Implantable Lead Implant Date: 19990610
Implantable Lead Implant Date: 20160713
Implantable Lead Location: 753859
Implantable Lead Location: 753860
Implantable Lead Model: 4136
Implantable Lead Serial Number: 29813363
Implantable Pulse Generator Implant Date: 20230303
Lead Channel Impedance Value: 408 Ohm
Lead Channel Impedance Value: 583 Ohm
Lead Channel Sensing Intrinsic Amplitude: 0.8 mV
Lead Channel Sensing Intrinsic Amplitude: 7 mV
Lead Channel Setting Pacing Amplitude: 1.4 V
Lead Channel Setting Pacing Pulse Width: 0.4 ms
Lead Channel Setting Sensing Sensitivity: 2.5 mV
Pulse Gen Serial Number: 672632

## 2021-12-13 NOTE — Progress Notes (Signed)
Wound check appointment. Steri-strips removed. Wound without redness or edema. Incision edges approximated, wound well healed. Normal device function. Thresholds, sensing, and impedances consistent with implant measurements. Device programmed with auto capture on. Histogram distribution appropriate for patient and level of activity. Known AF no OAC due to bleeding issues. No high ventricular rates noted. Patient educated about wound care, arm mobility. ROV with WC 01/04/22 ?

## 2021-12-13 NOTE — Patient Instructions (Signed)
? ?  After Your Pacemaker ? ? ?Monitor your pacemaker site for redness, swelling, and drainage. Call the device clinic at [phone] if you experience these symptoms or fever/chills. ? ?Your incision was closed with Steri-strips or staples:  You may shower 7 days after your procedure and wash your incision with soap and water. Avoid lotions, ointments, or perfumes over your incision until it is well-healed. ? ?You may use a hot tub or a pool after your wound check appointment if the incision is completely closed. ? ? ?Remote monitoring is used to monitor your pacemaker from home. This monitoring is scheduled every 91 days by our office. It allows [place] to keep an eye on the functioning of your device to ensure it is working properly. You will routinely see your Electrophysiologist annually (more often if necessary).  ?

## 2021-12-18 ENCOUNTER — Other Ambulatory Visit: Payer: Self-pay | Admitting: Family Medicine

## 2021-12-18 DIAGNOSIS — E876 Hypokalemia: Secondary | ICD-10-CM

## 2022-01-04 ENCOUNTER — Encounter: Payer: Medicare Other | Admitting: Cardiology

## 2022-01-08 DIAGNOSIS — H905 Unspecified sensorineural hearing loss: Secondary | ICD-10-CM | POA: Diagnosis not present

## 2022-01-11 ENCOUNTER — Other Ambulatory Visit: Payer: Self-pay | Admitting: Family Medicine

## 2022-01-11 ENCOUNTER — Other Ambulatory Visit (HOSPITAL_COMMUNITY): Payer: Self-pay

## 2022-01-12 ENCOUNTER — Other Ambulatory Visit (HOSPITAL_COMMUNITY): Payer: Self-pay

## 2022-01-12 MED ORDER — DICYCLOMINE HCL 10 MG PO CAPS
10.0000 mg | ORAL_CAPSULE | Freq: Four times a day (QID) | ORAL | 0 refills | Status: AC
  Filled 2022-01-12: qty 60, 15d supply, fill #0

## 2022-02-01 ENCOUNTER — Encounter: Payer: Self-pay | Admitting: Internal Medicine

## 2022-02-08 ENCOUNTER — Telehealth: Payer: Self-pay | Admitting: Cardiology

## 2022-02-08 NOTE — Telephone Encounter (Signed)
Daughter calling in to speak with device to make sure everything is okay with patient heart monitor. Please advise ?

## 2022-02-08 NOTE — Telephone Encounter (Signed)
LVM on daughter phone informing her that patients home monitor for her pacemaker is working fine  ?

## 2022-02-24 ENCOUNTER — Emergency Department (HOSPITAL_COMMUNITY)

## 2022-02-24 ENCOUNTER — Observation Stay (HOSPITAL_COMMUNITY)
Admission: EM | Admit: 2022-02-24 | Discharge: 2022-02-25 | Disposition: A | Discharge status: Hospice | Attending: Internal Medicine | Admitting: Internal Medicine

## 2022-02-24 ENCOUNTER — Other Ambulatory Visit: Payer: Self-pay

## 2022-02-24 ENCOUNTER — Encounter (HOSPITAL_COMMUNITY): Payer: Self-pay | Admitting: Emergency Medicine

## 2022-02-24 DIAGNOSIS — C7931 Secondary malignant neoplasm of brain: Secondary | ICD-10-CM | POA: Diagnosis not present

## 2022-02-24 DIAGNOSIS — I5022 Chronic systolic (congestive) heart failure: Secondary | ICD-10-CM | POA: Insufficient documentation

## 2022-02-24 DIAGNOSIS — Z79899 Other long term (current) drug therapy: Secondary | ICD-10-CM | POA: Insufficient documentation

## 2022-02-24 DIAGNOSIS — I480 Paroxysmal atrial fibrillation: Secondary | ICD-10-CM | POA: Diagnosis not present

## 2022-02-24 DIAGNOSIS — E039 Hypothyroidism, unspecified: Secondary | ICD-10-CM | POA: Diagnosis not present

## 2022-02-24 DIAGNOSIS — F17210 Nicotine dependence, cigarettes, uncomplicated: Secondary | ICD-10-CM | POA: Diagnosis not present

## 2022-02-24 DIAGNOSIS — R079 Chest pain, unspecified: Secondary | ICD-10-CM

## 2022-02-24 DIAGNOSIS — R0789 Other chest pain: Secondary | ICD-10-CM | POA: Diagnosis not present

## 2022-02-24 DIAGNOSIS — I4821 Permanent atrial fibrillation: Secondary | ICD-10-CM | POA: Insufficient documentation

## 2022-02-24 DIAGNOSIS — Z950 Presence of cardiac pacemaker: Secondary | ICD-10-CM | POA: Insufficient documentation

## 2022-02-24 DIAGNOSIS — E038 Other specified hypothyroidism: Secondary | ICD-10-CM | POA: Diagnosis not present

## 2022-02-24 DIAGNOSIS — E876 Hypokalemia: Secondary | ICD-10-CM | POA: Diagnosis not present

## 2022-02-24 DIAGNOSIS — J439 Emphysema, unspecified: Secondary | ICD-10-CM | POA: Diagnosis not present

## 2022-02-24 DIAGNOSIS — I25119 Atherosclerotic heart disease of native coronary artery with unspecified angina pectoris: Secondary | ICD-10-CM

## 2022-02-24 DIAGNOSIS — J42 Unspecified chronic bronchitis: Secondary | ICD-10-CM | POA: Diagnosis not present

## 2022-02-24 DIAGNOSIS — I110 Hypertensive heart disease with heart failure: Secondary | ICD-10-CM | POA: Insufficient documentation

## 2022-02-24 DIAGNOSIS — R7309 Other abnormal glucose: Secondary | ICD-10-CM | POA: Diagnosis not present

## 2022-02-24 DIAGNOSIS — Z955 Presence of coronary angioplasty implant and graft: Secondary | ICD-10-CM | POA: Insufficient documentation

## 2022-02-24 DIAGNOSIS — Z7984 Long term (current) use of oral hypoglycemic drugs: Secondary | ICD-10-CM | POA: Insufficient documentation

## 2022-02-24 DIAGNOSIS — J449 Chronic obstructive pulmonary disease, unspecified: Secondary | ICD-10-CM | POA: Diagnosis not present

## 2022-02-24 DIAGNOSIS — C3491 Malignant neoplasm of unspecified part of right bronchus or lung: Secondary | ICD-10-CM | POA: Diagnosis not present

## 2022-02-24 DIAGNOSIS — E1169 Type 2 diabetes mellitus with other specified complication: Secondary | ICD-10-CM | POA: Insufficient documentation

## 2022-02-24 DIAGNOSIS — I2510 Atherosclerotic heart disease of native coronary artery without angina pectoris: Secondary | ICD-10-CM | POA: Diagnosis not present

## 2022-02-24 LAB — D-DIMER, QUANTITATIVE: D-Dimer, Quant: 0.47 ug/mL-FEU (ref 0.00–0.50)

## 2022-02-24 LAB — COMPREHENSIVE METABOLIC PANEL
ALT: 15 U/L (ref 0–44)
AST: 13 U/L — ABNORMAL LOW (ref 15–41)
Albumin: 2.8 g/dL — ABNORMAL LOW (ref 3.5–5.0)
Alkaline Phosphatase: 58 U/L (ref 38–126)
Anion gap: 7 (ref 5–15)
BUN: 9 mg/dL (ref 8–23)
CO2: 31 mmol/L (ref 22–32)
Calcium: 8.5 mg/dL — ABNORMAL LOW (ref 8.9–10.3)
Chloride: 105 mmol/L (ref 98–111)
Creatinine, Ser: 0.55 mg/dL (ref 0.44–1.00)
GFR, Estimated: >60 mL/min (ref >60)
Glucose, Bld: 185 mg/dL — ABNORMAL HIGH (ref 70–99)
Potassium: 2.6 mmol/L — CL (ref 3.5–5.1)
Sodium: 143 mmol/L (ref 135–145)
Total Bilirubin: 0.3 mg/dL (ref 0.3–1.2)
Total Protein: 5.5 g/dL — ABNORMAL LOW (ref 6.5–8.1)

## 2022-02-24 LAB — CBC WITH DIFFERENTIAL/PLATELET
Abs Immature Granulocytes: 0.02 10*3/uL (ref 0.00–0.07)
Basophils Absolute: 0 10*3/uL (ref 0.0–0.1)
Basophils Relative: 0 %
Eosinophils Absolute: 0.1 10*3/uL (ref 0.0–0.5)
Eosinophils Relative: 1 %
HCT: 36.6 % (ref 36.0–46.0)
Hemoglobin: 11.7 g/dL — ABNORMAL LOW (ref 12.0–15.0)
Immature Granulocytes: 0 %
Lymphocytes Relative: 20 %
Lymphs Abs: 1 10*3/uL (ref 0.7–4.0)
MCH: 31.3 pg (ref 26.0–34.0)
MCHC: 32 g/dL (ref 30.0–36.0)
MCV: 97.9 fL (ref 80.0–100.0)
Monocytes Absolute: 0.4 10*3/uL (ref 0.1–1.0)
Monocytes Relative: 8 %
Neutro Abs: 3.6 10*3/uL (ref 1.7–7.7)
Neutrophils Relative %: 71 %
Platelets: 117 10*3/uL — ABNORMAL LOW (ref 150–400)
RBC: 3.74 MIL/uL — ABNORMAL LOW (ref 3.87–5.11)
RDW: 14.1 % (ref 11.5–15.5)
WBC: 5.2 10*3/uL (ref 4.0–10.5)
nRBC: 0 % (ref 0.0–0.2)

## 2022-02-24 LAB — GLUCOSE, CAPILLARY: Glucose-Capillary: 162 mg/dL — ABNORMAL HIGH (ref 70–99)

## 2022-02-24 LAB — LIPASE, BLOOD: Lipase: 20 U/L (ref 11–51)

## 2022-02-24 LAB — TROPONIN I (HIGH SENSITIVITY): Troponin I (High Sensitivity): 15 ng/L (ref <18)

## 2022-02-24 LAB — MAGNESIUM: Magnesium: 1.7 mg/dL (ref 1.7–2.4)

## 2022-02-24 LAB — PHOSPHORUS: Phosphorus: 3.4 mg/dL (ref 2.5–4.6)

## 2022-02-24 MED ORDER — INSULIN ASPART 100 UNIT/ML IJ SOLN: 0.0000 [IU] | Freq: Three times a day (TID) | INTRAMUSCULAR | Status: DC

## 2022-02-24 MED ORDER — IOHEXOL 350 MG/ML SOLN
100.0000 mL | Freq: Once | INTRAVENOUS | Status: AC | PRN
  Administered 2022-02-24: 100 mL via INTRAVENOUS

## 2022-02-24 MED ORDER — POTASSIUM CHLORIDE CRYS ER 20 MEQ PO TBCR
20.0000 meq | EXTENDED_RELEASE_TABLET | Freq: Every day | ORAL | Status: DC
  Administered 2022-02-25: 20 meq via ORAL
  Filled 2022-02-24: qty 1

## 2022-02-24 MED ORDER — MAGNESIUM SULFATE IN D5W 1-5 GM/100ML-% IV SOLN
1.0000 g | Freq: Once | INTRAVENOUS | Status: AC
Start: 2022-02-24 — End: 2022-02-24
  Administered 2022-02-24: 1 g via INTRAVENOUS
  Filled 2022-02-24: qty 100

## 2022-02-24 MED ORDER — CARVEDILOL 3.125 MG PO TABS
3.1250 mg | ORAL_TABLET | Freq: Two times a day (BID) | ORAL | Status: DC
  Administered 2022-02-25: 3.125 mg via ORAL
  Filled 2022-02-24: qty 1

## 2022-02-24 MED ORDER — ACETAMINOPHEN 650 MG RE SUPP: 650.0000 mg | Freq: Four times a day (QID) | RECTAL | Status: DC | PRN

## 2022-02-24 MED ORDER — MORPHINE SULFATE (PF) 2 MG/ML IV SOLN: 2.0000 mg | INTRAVENOUS | Status: DC | PRN

## 2022-02-24 MED ORDER — INSULIN ASPART 100 UNIT/ML IJ SOLN: 0.0000 [IU] | INTRAMUSCULAR | Status: DC

## 2022-02-24 MED ORDER — OXYCODONE-ACETAMINOPHEN 5-325 MG PO TABS: 1.0000 | ORAL_TABLET | Freq: Three times a day (TID) | ORAL | Status: DC | PRN

## 2022-02-24 MED ORDER — ACETAMINOPHEN 325 MG PO TABS: 650.0000 mg | ORAL_TABLET | Freq: Four times a day (QID) | ORAL | Status: DC | PRN

## 2022-02-24 MED ORDER — QUETIAPINE FUMARATE 25 MG PO TABS
12.5000 mg | ORAL_TABLET | Freq: Two times a day (BID) | ORAL | Status: DC
  Administered 2022-02-25 (×2): 12.5 mg via ORAL
  Filled 2022-02-24 (×2): qty 1

## 2022-02-24 MED ORDER — IPRATROPIUM-ALBUTEROL 0.5-2.5 (3) MG/3ML IN SOLN: 1.5000 mL | Freq: Three times a day (TID) | RESPIRATORY_TRACT | Status: DC | PRN

## 2022-02-24 MED ORDER — DEXAMETHASONE 2 MG PO TABS
2.0000 mg | ORAL_TABLET | Freq: Every day | ORAL | Status: DC
  Administered 2022-02-25: 2 mg via ORAL
  Filled 2022-02-24: qty 1

## 2022-02-24 MED ORDER — DICYCLOMINE HCL 10 MG PO CAPS
10.0000 mg | ORAL_CAPSULE | Freq: Four times a day (QID) | ORAL | Status: DC
  Administered 2022-02-25: 10 mg via ORAL
  Filled 2022-02-24: qty 1

## 2022-02-24 MED ORDER — ALBUTEROL SULFATE (2.5 MG/3ML) 0.083% IN NEBU: 2.5000 mg | INHALATION_SOLUTION | RESPIRATORY_TRACT | Status: DC | PRN

## 2022-02-24 MED ORDER — POTASSIUM CHLORIDE 10 MEQ/100ML IV SOLN
10.0000 meq | INTRAVENOUS | Status: AC
  Administered 2022-02-24 (×3): 10 meq via INTRAVENOUS
  Filled 2022-02-24 (×3): qty 100

## 2022-02-24 MED ORDER — POTASSIUM CHLORIDE CRYS ER 20 MEQ PO TBCR
40.0000 meq | EXTENDED_RELEASE_TABLET | Freq: Once | ORAL | Status: AC
Start: 2022-02-24 — End: 2022-02-25
  Administered 2022-02-25: 40 meq via ORAL
  Filled 2022-02-24 (×2): qty 2

## 2022-02-24 MED ORDER — LEVOTHYROXINE SODIUM 112 MCG PO TABS
112.0000 ug | ORAL_TABLET | Freq: Every day | ORAL | Status: DC
  Administered 2022-02-25: 112 ug via ORAL
  Filled 2022-02-24: qty 1

## 2022-02-24 MED ORDER — LORAZEPAM 0.5 MG PO TABS: 0.5000 mg | ORAL_TABLET | Freq: Two times a day (BID) | ORAL | Status: DC | PRN

## 2022-02-24 MED ORDER — SODIUM CHLORIDE 0.9 % IV SOLN: 250.0000 mL | INTRAVENOUS | Status: DC | PRN

## 2022-02-24 MED ORDER — SODIUM CHLORIDE 0.9% FLUSH: 3.0000 mL | INTRAVENOUS | Status: DC | PRN

## 2022-02-24 MED ORDER — SODIUM CHLORIDE 0.9% FLUSH
3.0000 mL | Freq: Two times a day (BID) | INTRAVENOUS | Status: DC
  Administered 2022-02-25 (×2): 3 mL via INTRAVENOUS

## 2022-02-24 MED ORDER — OXYCODONE-ACETAMINOPHEN 10-325 MG PO TABS: 0.5000 | ORAL_TABLET | Freq: Three times a day (TID) | ORAL | Status: DC | PRN

## 2022-02-24 NOTE — Assessment & Plan Note (Signed)
Continue decadrone

## 2022-02-24 NOTE — H&P (Signed)
[name] QHU:[id number] DOB: May 16, 1935 DOA: 02/24/2022     PCP: [name], [name], MD   Outpatient Specialists:   CARDS: Dr.Caminiz    Oncology   Dr.Mohamed    Patient arrived to ER on 02/24/22 at 1542 Referred by Attending Rancour, Annie Main, MD   Patient coming from:    home Lives  With family    Chief Complaint:   Chief Complaint  Patient presents with   Chest Pain   Facial Swelling    HPI: [name] is a 87 y.o. female with medical history significant of LUNG CA w mets to brain, CAD, pacemaker placement, CHF EF 20%, COPD    Presented with   chest pain  Left side rib pain CP for 2 wks Has meatastic lung cancer on hospice Pain going to her back Some pain is exertional She has hx of A.fib  L side of face "puffy" few days ago  Pt has stopped her chemo treatment Deos not smoke or drink   Regarding pertinent Chronic problems:      chronic CHF diastolic/systolic/ combined - last echo sep 2022 estimation, is 25 to 30%    CAD  - On   betablocker,                  -  followed by cardiology                - last cardiac cath 2017       DM 2 -  Lab Results  Component Value Date   HGBA1C 6.8 (H) 06/22/2021   diet controlled   Hypothyroidism:  Lab Results  Component Value Date   TSH 0.473 06/29/2021   on synthroid      COPD - not  followed by pulmonology   not  on baseline oxygen      A. Fib -  - CHA2DS2 vas score  7  Not on anticoagulation secondary to Risk of Falls, brain mets        -  Rate control:  Currently controlled with Coreg        Lung CA w mets to the brain on hospice  While in ER:    Potassium 2.6 repletion ordered  CXR -  NON acute    CTA chest -  nonacute, no PE,  no evidence of infiltrate There is no evidence of thoracic aortic dissection. Major branches of thoracic aorta appear patent. Coronary artery calcifications are seen.   There is no evidence of pulmonary artery embolism. There is no focal pulmonary consolidation.    Centrilobular and panlobular emphysema. There are increased interstitial markings in the lower lung fields suggesting interval progression of pulmonary fibrosis or suggest superimposed interstitial pneumonia.   Following Medications were ordered in ER: Medications  potassium chloride 10 mEq in 100 mL IVPB (10 mEq Intravenous New Bag/Given 02/24/22 2015)  iohexol (OMNIPAQUE) 350 MG/ML injection 100 mL (100 mLs Intravenous Contrast Given 02/24/22 2003)        ED Triage Vitals  Enc Vitals Group     BP 02/24/22 1602 (!) 122/46     Pulse Rate 02/24/22 1602 78     Resp 02/24/22 1602 12     Temp 02/24/22 1602 99.2 F (37.3 C)     Temp Source 02/24/22 1602 Oral     SpO2 02/24/22 1602 98 %     Weight --      Height --      Head Circumference --  Peak Flow --      Pain Score 02/24/22 1555 0     Pain Loc --      Pain Edu? --      Excl. in [place]? --   TMAX(24)@     _________________________________________ Significant initial  Findings: Abnormal Labs Reviewed  CBC WITH DIFFERENTIAL/PLATELET - Abnormal; Notable for the following components:      Result Value   RBC 3.74 (*)    Hemoglobin 11.7 (*)    Platelets 117 (*)    All other components within normal limits  COMPREHENSIVE METABOLIC PANEL - Abnormal; Notable for the following components:   Potassium 2.6 (*)    Glucose, Bld 185 (*)    Calcium 8.5 (*)    Total Protein 5.5 (*)    Albumin 2.8 (*)    AST 13 (*)    All other components within normal limits     _________________________ Troponin 15 ECG: Ordered Personally reviewed by me showing: HR : 60 Rhythm: Atrial fibrillation with occasional ventricular-paced complexes ST & T wave abnormality, consider inferior ischemia ST & T wave abnormality, consider anterolateral ischemia Abnormal ECG QTC 456   The recent clinical data is shown below. Vitals:   02/24/22 1845 02/24/22 1930 02/24/22 1945 02/24/22 2015  BP: 128/60   (!) 143/55  Pulse: 68 65 65 70  Resp: 18 17 (!)  21 17  Temp:      TempSrc:      SpO2: 96% 93% 97% 94%     WBC     Component Value Date/Time   WBC 5.2 02/24/2022 1728   LYMPHSABS 1.0 02/24/2022 1728   MONOABS 0.4 02/24/2022 1728   EOSABS 0.1 02/24/2022 1728   BASOSABS 0.0 02/24/2022 1728     UA   ordered      ______________________________________ Hospitalist was called for admission for Chest pain and hypokalemia   The following Work up has been ordered so far:  Orders Placed This Encounter  Procedures   DG Chest Portable 1 View   CT Angio Chest Aorta W and/or Wo Contrast   CBC with Differential   Comprehensive metabolic panel   Lipase, blood   D-dimer, quantitative   Magnesium   Cardiac monitoring   Nursing Communication Interrogate PPM p[lease   Nursing Communication Doppler DP and PT pulses please   Consult to hospitalist   Pulse oximetry, continuous   EKG 12-Lead     OTHER Significant initial  Findings:  labs showing:    Recent Labs  Lab 02/24/22 1728  NA 143  K 2.6*  CO2 31  GLUCOSE 185*  BUN 9  CREATININE 0.55  CALCIUM 8.5*  MG 1.7  PHOS 3.4    Cr  stable,   Lab Results  Component Value Date   CREATININE 0.55 02/24/2022   CREATININE 0.49 (L) 11/09/2021   CREATININE 0.36 (L) 10/26/2021    Recent Labs  Lab 02/24/22 1728  AST 13*  ALT 15  ALKPHOS 58  BILITOT 0.3  PROT 5.5*  ALBUMIN 2.8*   Lab Results  Component Value Date   CALCIUM 8.5 (L) 02/24/2022          Plt: Lab Results  Component Value Date   PLT 117 (L) 02/24/2022       COVID-19 Labs  Recent Labs    02/24/22 1728  DDIMER 0.47    Lab Results  Component Value Date   SARSCOV2NAA POSITIVE (A) 10/26/2021   SARSCOV2NAA NEGATIVE 06/21/2021   SARSCOV2NAA Detected (A) 10/16/2019  Recent Labs  Lab 02/24/22 1728  WBC 5.2  NEUTROABS 3.6  HGB 11.7*  HCT 36.6  MCV 97.9  PLT 117*    HG/HCT  stable,       Component Value Date/Time   HGB 11.7 (L) 02/24/2022 1728   HGB 12.0 11/09/2021 1528    HCT 36.6 02/24/2022 1728   HCT 35.6 11/09/2021 1528   MCV 97.9 02/24/2022 1728   MCV 90 11/09/2021 1528     Recent Labs  Lab 02/24/22 1728  LIPASE 20       DM  labs:  HbA1C: Recent Labs    04/26/21 0809 06/22/21 0546  HGBA1C 8.8* 6.8*       CBG (last 3)  No results for input(s): GLUCAP in the last 72 hours.        Cultures: No results found for: SDES, [place], CULT, REPTSTATUS   Radiological Exams on Admission: DG Chest Portable 1 View  Result Date: 02/24/2022 CLINICAL DATA:  Chest pain EXAM: PORTABLE CHEST 1 VIEW COMPARISON:  06/21/2021 FINDINGS: Lungs are clear.  No pleural effusion or pneumothorax. The heart is normal in size.  Right chest pacemaker. Left IJ chest port terminating in the upper right atrium. IMPRESSION: No evidence of acute cardiopulmonary disease. Electronically Signed   By: Julian Hy M.D.   On: 02/24/2022 17:18   CT Angio Chest Aorta W and/or Wo Contrast  Result Date: 02/24/2022 CLINICAL DATA:  Acute aortic syndrome suspected EXAM: CT ANGIOGRAPHY CHEST WITH CONTRAST TECHNIQUE: Multidetector CT imaging of the chest was performed using the standard protocol before and during bolus administration of intravenous contrast. Multiplanar CT image reconstructions and MIPs were obtained to evaluate the vascular anatomy. RADIATION DOSE REDUCTION: This exam was performed according to the departmental dose-optimization program which includes automated exposure control, adjustment of the mA and/or kV according to patient size and/or use of iterative reconstruction technique. CONTRAST:  158mL OMNIPAQUE IOHEXOL 350 MG/ML SOLN COMPARISON:  06/21/2021 FINDINGS: Cardiovascular: There is no demonstrable mural hematoma in the noncontrast images of thoracic aorta. There is homogeneous enhancement in the thoracic aorta. There are scattered atherosclerotic plaques and calcifications in the thoracic aorta. Coronary artery calcifications are seen. There are no  intraluminal filling defects in the pulmonary artery branches. Pacemaker battery is seen in the right infraclavicular region. Mediastinum/Nodes: No significant lymphadenopathy seen. Thyroid is difficult to visualize suggesting atrophy or previous removal. Lungs/Pleura: Centrilobular and panlobular emphysema is seen. Increased interstitial markings are seen in the periphery of both lungs, there are small patchy densities in both lower lung fields, more so on the right side. More so in the lower lung fields. Upper Abdomen: There is 2.5 cm fluid density lesion in the posterior midportion of left kidney suggesting renal cyst. There is reflux of contrast into hepatic veins suggesting possible tricuspid incompetence. Musculoskeletal: Unremarkable. Review of the MIP images confirms the above findings. IMPRESSION: There is no evidence of thoracic aortic dissection. Major branches of thoracic aorta appear patent. Coronary artery calcifications are seen. There is no evidence of pulmonary artery embolism. There is no focal pulmonary consolidation. Centrilobular and panlobular emphysema. There are increased interstitial markings in the lower lung fields suggesting interval progression of pulmonary fibrosis or suggest superimposed interstitial pneumonia. Other findings as described in the body of the report. Electronically Signed   By: Elmer Picker M.D.   On: 02/24/2022 20:15   _______________________________________________________________________________________________________ Latest   Blood pressure (!) 143/55, pulse 70, temperature 99.2 F (37.3 C), temperature source Oral, resp. rate 17,  SpO2 94 %.   Vitals  labs and radiology finding personally reviewed  Review of Systems:    Pertinent positives include:   chest pain,  Constitutional:  No weight loss, night sweats, Fevers, chills, fatigue, weight loss  HEENT:  No headaches, Difficulty swallowing,Tooth/dental problems,Sore throat,  No sneezing,  itching, ear ache, nasal congestion, post nasal drip,  Cardio-vascular:  No Orthopnea, PND, anasarca, dizziness, palpitations.no Bilateral lower extremity swelling  GI:  No heartburn, indigestion, abdominal pain, nausea, vomiting, diarrhea, change in bowel habits, loss of appetite, melena, blood in stool, hematemesis Resp:  no shortness of breath at rest. No dyspnea on exertion, No excess mucus, no productive cough, No non-productive cough, No coughing up of blood.No change in color of mucus.No wheezing. Skin:  no rash or lesions. No jaundice GU:  no dysuria, change in color of urine, no urgency or frequency. No straining to urinate.  No flank pain.  Musculoskeletal:  No joint pain or no joint swelling. No decreased range of motion. No back pain.  Psych:  No change in mood or affect. No depression or anxiety. No memory loss.  Neuro: no localizing neurological complaints, no tingling, no weakness, no double vision, no gait abnormality, no slurred speech, no confusion  All systems reviewed and apart from [place] all are negative _______________________________________________________________________________________________ Past Medical History:   Past Medical History:  Diagnosis Date   Anemia    CAD (coronary artery disease), native coronary artery    PTCA of distal right coronary artery 1997 Cypher stents to circumflex 2005 Cardiac cath in 2011 with patent stent to circumflex and moderate disease elsewhere treated medically    Cardiac pacemaker in situ 12/24/2015   Original implant reportedly in 1991 for tachybradycardia syndrome, generator change in 1999, 2004 and evidently again in 2016 in [place]    Chronic systolic CHF (congestive heart failure) (HCC)    COPD (chronic obstructive pulmonary disease) ([place])    Diabetes mellitus without complication ([place])    Hypertension    Hypothyroidism 03/23/2010   Lung cancer ([place])    Pacemaker    Permanent atrial fibrillation ([place]) 03/23/2010    Prolonged Q-T interval on ECG       Past Surgical History:  Procedure Laterality Date   BIOPSY  01/08/2020   Procedure: BIOPSY;  Surgeon: Garner Nash, DO;  Location: [facility];  Service: Pulmonary;;   BRONCHIAL BRUSHINGS  01/08/2020   Procedure: BRONCHIAL BRUSHINGS;  Surgeon: Garner Nash, DO;  Location: [facility];  Service: Pulmonary;;   BRONCHIAL WASHINGS  01/08/2020   Procedure: BRONCHIAL WASHINGS;  Surgeon: Garner Nash, DO;  Location: [facility];  Service: Pulmonary;;   CARDIAC CATHETERIZATION N/A 12/26/2015   Procedure: Left Heart Cath and Coronary Angiography;  Surgeon: Peter M [name], MD;  Location: [facility];  Service: Cardiovascular;  Laterality: N/A;   CARDIOVERSION  05/2015   CHOLECYSTECTOMY  2012   CORONARY ANGIOPLASTY WITH STENT PLACEMENT  1990   ENDOBRONCHIAL ULTRASOUND  01/08/2020   Procedure: ENDOBRONCHIAL ULTRASOUND;  Surgeon: Garner Nash, DO;  Location: [facility];  Service: Pulmonary;;   FINE NEEDLE ASPIRATION BIOPSY  01/08/2020   Procedure: FINE NEEDLE ASPIRATION BIOPSY;  Surgeon: Garner Nash, DO;  Location: [facility];  Service: Pulmonary;;   HERNIA REPAIR     IR IMAGING GUIDED PORT INSERTION  02/18/2020   PACEMAKER GENERATOR CHANGE  1999,2004, 2016   PACEMAKER INSERTION  1991   PPM GENERATOR CHANGEOUT N/A 12/01/2021   Procedure: PPM GENERATOR CHANGEOUT;  Surgeon: Constance Haw, MD;  Location: [facility];  Service: Cardiovascular;  Laterality: N/A;   VIDEO BRONCHOSCOPY WITH ENDOBRONCHIAL ULTRASOUND N/A 01/08/2020   Procedure: VIDEO BRONCHOSCOPY;  Surgeon: Garner Nash, DO;  Location: [facility];  Service: Pulmonary;  Laterality: N/A;    Social History:  Ambulatory   walker       reports that she has been smoking cigarettes. She has a 16.50 pack-year smoking history. She has never used smokeless tobacco. She reports that she does not drink alcohol and does not use drugs.     Family History:   Family  History  Problem Relation Age of Onset   Heart disease Father    Heart disease Mother    Heart attack Mother    Cirrhosis Brother    Colon cancer Neg Hx    Stomach cancer Neg Hx    ______________________________________________________________________________________________ Allergies: Allergies  Allergen Reactions   Seasonal Ic [Cholestatin]     Itchy eye and runny nose     Prior to Admission medications   Medication Sig Start Date End Date Taking? Authorizing Provider  acetaminophen (TYLENOL) 500 MG tablet Take 500 mg by mouth every 6 (six) hours as needed for headache or moderate pain. Rapid release    [provider]  carvedilol (COREG) 3.125 MG tablet Take 3.125 mg by mouth 2 (two) times daily. 10/24/21   [provider]  dexamethasone (DECADRON) 2 MG tablet Take 2 mg by mouth daily.    [provider]  dicyclomine (BENTYL) 10 MG capsule Take 1 capsule (10 mg total) by mouth 4 (four) times daily. 01/11/22     glucose blood (ONETOUCH VERIO) test strip Use to test 3-4 times daily. 09/08/19   [name], [name], MD  ipratropium-albuterol (DUONEB) 0.5-2.5 (3) MG/3ML SOLN Take 1.5 mLs by nebulization 3 (three) times daily as needed. Patient taking differently: Take 1.5 mLs by nebulization 3 (three) times daily as needed (Shortness of breath/COPD). 06/27/21   [name], [name], MD  KLOR-CON M20 20 MEQ tablet TAKE 1 TABLET BY MOUTH EVERY DAY 12/20/21   [name], [name], MD  levothyroxine (SYNTHROID) 112 MCG tablet TAKE 1 TABLET BY MOUTH DAILY BEFORE BREAKFAST. 08/02/21   [name], [name], MD  linagliptin (TRADJENTA) 5 MG TABS tablet Take 1 tablet (5 mg total) by mouth daily. Patient not taking: Reported on 11/27/2021 04/26/21   [name], [name], MD  Loperamide HCl (IMODIUM PO) Take 1 tablet by mouth daily as needed (loose stool).    [provider]  LORazepam (ATIVAN) 0.5 MG tablet Take 0.5 mg by mouth 2 (two) times daily as needed for anxiety (shortness of breath).  11/17/21   [provider]  Multiple Vitamin (MULTIVITAMIN WITH MINERALS) TABS tablet Take 1 tablet by mouth daily. 06/23/21   Hongalgi, Lenis Dickinson, MD  nitroGLYCERIN (NITROSTAT) 0.4 MG SL tablet Place 1 tablet (0.4 mg total) under the tongue every 5 (five) minutes x 3 doses as needed for chest pain (Max 3 doses within 15 min. Call 911). 05/26/20   Sherran Needs, NP  oxyCODONE-acetaminophen (PERCOCET) 10-325 MG tablet Take 0.5-1 tablets by mouth every 12 (twelve) hours as needed for pain. Patient taking differently: Take 0.5 tablets by mouth every 12 (twelve) hours as needed for pain. 06/28/21   [name], [name], MD  pantoprazole (PROTONIX) 40 MG tablet TAKE 1 TABLET BY MOUTH EVERY DAY Patient taking differently: Take 40 mg by mouth daily. 03/29/21   [name], [name], MD  PRESCRIPTION MEDICATION Take 0.25-0.5 mLs by mouth daily as needed (Shortness of breath/pain). MORPHINE SULF 100 MG/5 ML CONC    [provider]  prochlorperazine (COMPAZINE) 10 MG tablet Take 1 tablet (10 mg total) by mouth every 6 (six) hours as needed for nausea or vomiting. 02/05/20   Curt Bears, MD  QUEtiapine (SEROQUEL) 25 MG tablet Take 12.5 mg by mouth 2 (two) times daily. 11/17/21   [provider]  Spacer/Aero-Holding Chambers (AEROCHAMBER PLUS) inhaler Use as instructed with inahaler. 04/26/21   [name], [name], MD    ___________________________________________________________________________________________________ Physical Exam:    02/24/2022    8:15 PM 02/24/2022    7:45 PM 02/24/2022    7:30 PM  Vitals with BMI  Systolic 329    Diastolic 55    Pulse 70 65 65     1. General:  in No  Acute distress   * Chronically ill   -appearing 2. Psychological: Alert and   Oriented 3. Head/ENT:     Dry  Mucous Membranes                          Head Non traumatic, neck supple                          Poor Dentition 4. SKIN: normal  Skin turgor,  Skin clean Dry and intact no rash 5. Heart:  Regular rate and rhythm no  Murmur, no Rub or gallop 6. Lungs:  no wheezes or crackles   7. Abdomen: Soft,  non-tender, Non distended  bowel sounds present 8. Lower extremities: no clubbing, cyanosis, trace edema 9. Neurologically Grossly intact, moving all 4 extremities equally   10. MSK: Normal range of motion    Chart has been reviewed  ______________________________________________________________________________________________  Assessment/Plan  87 y.o. female with medical history significant of LUNG CA w mets to brain, CAD, pacemaker placement, CHF EF 20%, COPD   Admitted for atypical chest pain  Present on Admission:  Hypokalemia  Hypothyroidism  CAD (coronary artery disease), native coronary artery  Paroxysmal atrial fibrillation (HCC)  COPD (chronic obstructive pulmonary disease) ([place])  Type 2 diabetes mellitus with other specified complication ([place])  Small cell lung cancer, right ([place])  Metastatic cancer to brain ([place])  Chronic systolic CHF (congestive heart failure) (HCC)  Chest pain     Hypokalemia - will replace and repeat in AM,  check magnesium level and replace as needed   Hypothyroidism - Check TSH continue home medications at current dose   CAD (coronary artery disease), native coronary artery Continue coreg, comfort care  Paroxysmal atrial fibrillation ([place]) Not on anticoagulation due to hx of brain mets  sp PACEMAKER  Cont coreg  COPD (chronic obstructive pulmonary disease) ([place]) Chronic stable  Type 2 diabetes mellitus with other specified complication ([place])  - Order Sensitiv  SSI     -  check TSH and HgA1C      Small cell lung cancer, right ([place]) No longer on chemo, now on decadron will continue  no longer sees oncology  Followed by Hospice will need MOST form  Metastatic cancer to brain ([place]) Continue decadrone  Chronic systolic CHF (congestive heart failure) ([place]) - currently appears to be slightly on the dry side, appears  euvolemic carefuly follow fluid status and Cr   Chest pain Trop neg ECG non ischemic  comfort care  not a candidate for aspirin or heparin given hx of  brain mets Concentrate on comfort  no agressive interventions Discussed with family who agrees    Other plan as per orders.  DVT prophylaxis:  SCD     Code Status:  DNR/DNI with emphasis on comfort care as per patient  family  I had personally discussed CODE STATUS with patient and family     Family Communication:   Family  at  Bedside  plan of care was discussed  with  Daughter,    Disposition Plan:      To home once workup is complete and patient is stable   Following barriers for discharge:                            Electrolytes corrected                       Would benefit from PT/OT eval prior to DC  Ordered                   Swallow eval - SLP ordered                   Diabetes care coordinator                                     Nutrition    consulted                        Palliative care    consulted                                      Consults called: none  Admission status:  ED Disposition     ED Disposition  [place]: [place] [100100]  Level of Care: Telemetry Medical [104]  May place patient in observation at [facility] or [facility] if equivalent level of care is available:: No  Covid Evaluation: Asymptomatic - no recent exposure (last 10 days) testing not required  Diagnosis: Hypokalemia [622297]  Admitting Physician: Toy Baker [[id number]]  Attending Physician: Toy Baker [[id number]]           Obs      Level of care     tele  For 12H       Rickey Farrier 02/24/2022, 10:53 PM    [facility]     after 2 AM please page floor coverage PA If 7AM-7PM, please contact the day team taking care of the patient using [email or web address]   Patient was evaluated in the context of the global COVID-19 pandemic, which  necessitated consideration that the patient might be at risk for infection with the SARS-CoV-2 virus that causes COVID-19. Institutional protocols and algorithms that pertain to the evaluation of patients at risk for COVID-19 are in a state of rapid change based on information released by regulatory bodies including the CDC and federal and state organizations. These policies and algorithms were followed during the patient's care.

## 2022-02-24 NOTE — ED Notes (Signed)
[employer] pacemaker interrogated.

## 2022-02-24 NOTE — ED Triage Notes (Signed)
Per family pt has been complaining of L sided rib pain that radiates to center of chest x 2 weeks.  Pt denies pain at present.  Home health nurse noticed L side of face "puffy" on Thursday and family reports significant L sided facial swelling since yesterday.  Pt has cancer.  Denies SOB.

## 2022-02-24 NOTE — ED Notes (Signed)
Called [phone] for purpleman

## 2022-02-24 NOTE — Assessment & Plan Note (Signed)
-   Check TSH continue home medications at current dose ° °

## 2022-02-24 NOTE — Assessment & Plan Note (Signed)
Continue coreg, comfort care

## 2022-02-24 NOTE — Assessment & Plan Note (Signed)
-   Order Sensitiv  SSI     -  check TSH and HgA1C

## 2022-02-24 NOTE — ED Provider Notes (Signed)
[facility] EMERGENCY DEPARTMENT Provider Note   CSN: [id number] Arrival date & time: 02/24/22  1542     History  Chief Complaint  Patient presents with   Chest Pain   Facial Swelling    [name] is a 87 y.o. female.  PMH CAD, pacemaker placement, CHF, COPD, metastatic lung cancer on hospice, permanent A. Fib .Presenting with intermittent chest pain for the past several weeks.  Patient is a poor historian has some confusion at baseline.  Daughter reports patient woke up this morning complaining of pain in the center of her chest that radiates to her mid back.  Symptoms have been coming and going intermittently for the past 2 weeks multiple times a day lasting for minutes to hours at a time.  Some associated shortness of breath.  No cough or fever.  No nausea, vomiting or diaphoresis.  Daughter thought it could be related to patient's recent pacemaker battery change but was told everything was normal with this.  Denies CP currently.  Patient's hospice nurse thought her left side of her face was swollen several days ago but this has since resolved.  She denies any difficulty breathing or difficulty swallowing.  The history is provided by the patient and a relative.  Chest Pain     Home Medications Prior to Admission medications   Medication Sig Start Date End Date Taking? Authorizing Provider  acetaminophen (TYLENOL) 500 MG tablet Take 500 mg by mouth every 6 (six) hours as needed for headache or moderate pain. Rapid release    [provider]  carvedilol (COREG) 3.125 MG tablet Take 3.125 mg by mouth 2 (two) times daily. 10/24/21   [provider]  dexamethasone (DECADRON) 2 MG tablet Take 2 mg by mouth daily.    [provider]  dicyclomine (BENTYL) 10 MG capsule Take 1 capsule (10 mg total) by mouth 4 (four) times daily. 01/11/22     glucose blood (ONETOUCH VERIO) test strip Use to test 3-4 times daily. 09/08/19   [name], [name], MD   ipratropium-albuterol (DUONEB) 0.5-2.5 (3) MG/3ML SOLN Take 1.5 mLs by nebulization 3 (three) times daily as needed. Patient taking differently: Take 1.5 mLs by nebulization 3 (three) times daily as needed (Shortness of breath/COPD). 06/27/21   [name], [name], MD  KLOR-CON M20 20 MEQ tablet TAKE 1 TABLET BY MOUTH EVERY DAY 12/20/21   [name], [name], MD  levothyroxine (SYNTHROID) 112 MCG tablet TAKE 1 TABLET BY MOUTH DAILY BEFORE BREAKFAST. 08/02/21   [name], [name], MD  linagliptin (TRADJENTA) 5 MG TABS tablet Take 1 tablet (5 mg total) by mouth daily. Patient not taking: Reported on 11/27/2021 04/26/21   [name], [name], MD  Loperamide HCl (IMODIUM PO) Take 1 tablet by mouth daily as needed (loose stool).    [provider]  LORazepam (ATIVAN) 0.5 MG tablet Take 0.5 mg by mouth 2 (two) times daily as needed for anxiety (shortness of breath). 11/17/21   [provider]  Multiple Vitamin (MULTIVITAMIN WITH MINERALS) TABS tablet Take 1 tablet by mouth daily. 06/23/21   Hongalgi, Lenis Dickinson, MD  nitroGLYCERIN (NITROSTAT) 0.4 MG SL tablet Place 1 tablet (0.4 mg total) under the tongue every 5 (five) minutes x 3 doses as needed for chest pain (Max 3 doses within 15 min. Call 911). 05/26/20   Sherran Needs, NP  oxyCODONE-acetaminophen (PERCOCET) 10-325 MG tablet Take 0.5-1 tablets by mouth every 12 (twelve) hours as needed for pain. Patient taking differently: Take  0.5 tablets by mouth every 12 (twelve) hours as needed for pain. 06/28/21   [name], [name], MD  pantoprazole (PROTONIX) 40 MG tablet TAKE 1 TABLET BY MOUTH EVERY DAY Patient taking differently: Take 40 mg by mouth daily. 03/29/21   [name], [name], MD  PRESCRIPTION MEDICATION Take 0.25-0.5 mLs by mouth daily as needed (Shortness of breath/pain). MORPHINE SULF 100 MG/5 ML CONC    [provider]  prochlorperazine (COMPAZINE) 10 MG tablet Take 1 tablet (10 mg total) by mouth every 6 (six) hours as needed for nausea or  vomiting. 02/05/20   Curt Bears, MD  QUEtiapine (SEROQUEL) 25 MG tablet Take 12.5 mg by mouth 2 (two) times daily. 11/17/21   [provider]  Spacer/Aero-Holding Chambers (AEROCHAMBER PLUS) inhaler Use as instructed with inahaler. 04/26/21   [name], [name], MD      Allergies    Seasonal ic [cholestatin]    Review of Systems   Review of Systems  Unable to perform ROS: Dementia  Cardiovascular:  Positive for chest pain.   Physical Exam Updated Vital Signs BP (!) 122/46   Pulse 78   Temp 99.2 F (37.3 C) (Oral)   Resp 12   LMP  (LMP Unknown)   SpO2 98%  Physical Exam Vitals and nursing note reviewed.  Constitutional:      General: She is not in acute distress.    Appearance: She is well-developed. She is not ill-appearing.  HENT:     Head: Normocephalic and atraumatic.     Mouth/Throat:     Pharynx: No oropharyngeal exudate.     Comments: No appreciable facial swelling.  No tongue swelling.  Her dentition is false Eyes:     Conjunctiva/sclera: Conjunctivae normal.     Pupils: Pupils are equal, round, and reactive to light.  Neck:     Comments: No meningismus. Cardiovascular:     Rate and Rhythm: Normal rate and regular rhythm.     Heart sounds: Normal heart sounds. No murmur heard. Pulmonary:     Effort: Pulmonary effort is normal. No respiratory distress.     Breath sounds: Normal breath sounds.  Chest:     Chest wall: No tenderness.  Abdominal:     Palpations: Abdomen is soft.     Tenderness: There is no abdominal tenderness. There is no guarding or rebound.  Musculoskeletal:        General: No tenderness. Normal range of motion.     Cervical back: Normal range of motion and neck supple.     Comments: Cool to palpation in feet. PT pulses present with Doppler  Skin:    General: Skin is warm.  Neurological:     Mental Status: She is alert and oriented to person, place, and time.     Cranial Nerves: No cranial nerve deficit.     Motor: No abnormal  muscle tone.     Coordination: Coordination normal.     Comments:  5/5 strength throughout. CN 2-12 intact.Equal grip strength.   Psychiatric:        Behavior: Behavior normal.    ED Results / Procedures / Treatments   Labs (all labs ordered are listed, but only abnormal results are displayed) Labs Reviewed  CBC WITH DIFFERENTIAL/PLATELET - Abnormal; Notable for the following components:      Result Value   RBC 3.74 (*)    Hemoglobin 11.7 (*)    Platelets 117 (*)    All other components within normal limits  COMPREHENSIVE METABOLIC  PANEL - Abnormal; Notable for the following components:   Potassium 2.6 (*)    Glucose, Bld 185 (*)    Calcium 8.5 (*)    Total Protein 5.5 (*)    Albumin 2.8 (*)    AST 13 (*)    All other components within normal limits  LIPASE, BLOOD  D-DIMER, QUANTITATIVE  MAGNESIUM  PHOSPHORUS  CK  BASIC METABOLIC PANEL  TROPONIN I (HIGH SENSITIVITY)  TROPONIN I (HIGH SENSITIVITY)    EKG EKG Interpretation  Date/Time:  Saturday Feb 24 2022 16:07:47 EDT Ventricular Rate:  69 PR Interval:    QRS Duration: 92 QT Interval:  426 QTC Calculation: 456 R Axis:   57 Text Interpretation: Atrial fibrillation with occasional ventricular-paced complexes ST & T wave abnormality, consider inferior ischemia ST & T wave abnormality, consider anterolateral ischemia Abnormal ECG When compared with ECG of 23-Jun-2021 09:33, PREVIOUS ECG IS PRESENT No significant change was found Confirmed by Ezequiel Essex [phone]) on 02/24/2022 4:28:54 PM  Radiology DG Chest Portable 1 View  Result Date: 02/24/2022 CLINICAL DATA:  Chest pain EXAM: PORTABLE CHEST 1 VIEW COMPARISON:  06/21/2021 FINDINGS: Lungs are clear.  No pleural effusion or pneumothorax. The heart is normal in size.  Right chest pacemaker. Left IJ chest port terminating in the upper right atrium. IMPRESSION: No evidence of acute cardiopulmonary disease. Electronically Signed   By: Julian Hy M.D.   On:  02/24/2022 17:18   CT Angio Chest Aorta W and/or Wo Contrast  Result Date: 02/24/2022 CLINICAL DATA:  Acute aortic syndrome suspected EXAM: CT ANGIOGRAPHY CHEST WITH CONTRAST TECHNIQUE: Multidetector CT imaging of the chest was performed using the standard protocol before and during bolus administration of intravenous contrast. Multiplanar CT image reconstructions and MIPs were obtained to evaluate the vascular anatomy. RADIATION DOSE REDUCTION: This exam was performed according to the departmental dose-optimization program which includes automated exposure control, adjustment of the mA and/or kV according to patient size and/or use of iterative reconstruction technique. CONTRAST:  160mL OMNIPAQUE IOHEXOL 350 MG/ML SOLN COMPARISON:  06/21/2021 FINDINGS: Cardiovascular: There is no demonstrable mural hematoma in the noncontrast images of thoracic aorta. There is homogeneous enhancement in the thoracic aorta. There are scattered atherosclerotic plaques and calcifications in the thoracic aorta. Coronary artery calcifications are seen. There are no intraluminal filling defects in the pulmonary artery branches. Pacemaker battery is seen in the right infraclavicular region. Mediastinum/Nodes: No significant lymphadenopathy seen. Thyroid is difficult to visualize suggesting atrophy or previous removal. Lungs/Pleura: Centrilobular and panlobular emphysema is seen. Increased interstitial markings are seen in the periphery of both lungs, there are small patchy densities in both lower lung fields, more so on the right side. More so in the lower lung fields. Upper Abdomen: There is 2.5 cm fluid density lesion in the posterior midportion of left kidney suggesting renal cyst. There is reflux of contrast into hepatic veins suggesting possible tricuspid incompetence. Musculoskeletal: Unremarkable. Review of the MIP images confirms the above findings. IMPRESSION: There is no evidence of thoracic aortic dissection. Major branches  of thoracic aorta appear patent. Coronary artery calcifications are seen. There is no evidence of pulmonary artery embolism. There is no focal pulmonary consolidation. Centrilobular and panlobular emphysema. There are increased interstitial markings in the lower lung fields suggesting interval progression of pulmonary fibrosis or suggest superimposed interstitial pneumonia. Other findings as described in the body of the report. Electronically Signed   By: Elmer Picker M.D.   On: 02/24/2022 20:15    Procedures Procedures  Medications Ordered in ED Medications - No data to display  ED Course/ Medical Decision Making/ A&P                           Medical Decision Making Amount and/or Complexity of Data Reviewed Labs: ordered. Radiology: ordered and independent interpretation performed. Decision-making details documented in ED Course. ECG/medicine tests: ordered and independent interpretation performed. Decision-making details documented in ED Course.  Risk Prescription drug management. Decision regarding hospitalization.  Several weeks of intermittent chest pain coming and going.  None currently.  Her EKG is atrial fibrillation with T wave inversions laterally which are unchanged.  Denies any chest pain currently.  EKG is unchanged from previous.  Chest x-ray is negative.  Results interpreted and reviewed by me.  Hypokalemia of 2.6.  Troponin is negative. CT is negative for pulmonary embolism or dissection.Results reviewed and interpreted by me.   Potassium is replaced.  CT scans as above. No further episodes of chest pain throughout ED course.  Daughter at bedside reports patient is DNR.  She does not want any aggressive interventions for her chest pain and likely would not want to have catheterization or stress test.  However she is not certain about this.  Her chest pain story is somewhat concerning as it is coming and going and exertional.  We will plan admission  observation overnight for chest pain rule out as well as replacement of her potassium.  Holding off on heparin given history of brain mets and negative enzymes  D/w Dr. Roel Cluck.         Final Clinical Impression(s) / ED Diagnoses Final diagnoses:  Chest pain, unspecified type  Hypokalemia    Rx / DC Orders ED Discharge Orders     None         Zailyn Rowser, Annie Main, MD 02/24/22 2318

## 2022-02-24 NOTE — ED Notes (Signed)
Pt placed on purewick 

## 2022-02-24 NOTE — Assessment & Plan Note (Addendum)
Not on anticoagulation due to hx of brain mets  sp PACEMAKER  Cont coreg

## 2022-02-24 NOTE — Subjective & Objective (Signed)
Left side rib pain CP for 2 wks Has meatastic lung cancer on hospice Pain going to her back Some pain is exertional She has hx of A.fib

## 2022-02-24 NOTE — Assessment & Plan Note (Signed)
-   currently appears to be slightly on the dry side, appears euvolemic carefuly follow fluid status and Cr

## 2022-02-24 NOTE — Assessment & Plan Note (Signed)
-   will replace and repeat in AM,  check magnesium level and replace as needed ° °

## 2022-02-24 NOTE — ED Notes (Signed)
PT and DP pulses recognized on doppler

## 2022-02-24 NOTE — Assessment & Plan Note (Signed)
Chronic-stable.

## 2022-02-24 NOTE — Assessment & Plan Note (Signed)
Trop neg ECG non ischemic  comfort care  not a candidate for aspirin or heparin given hx of brain mets Concentrate on comfort  no agressive interventions Discussed with family who agrees

## 2022-02-24 NOTE — Assessment & Plan Note (Signed)
No longer on chemo, now on decadron will continue  no longer sees oncology  Followed by Hospice will need MOST form

## 2022-02-25 DIAGNOSIS — E876 Hypokalemia: Secondary | ICD-10-CM | POA: Diagnosis not present

## 2022-02-25 DIAGNOSIS — R079 Chest pain, unspecified: Secondary | ICD-10-CM | POA: Diagnosis not present

## 2022-02-25 LAB — CBC
HCT: 33.3 % — ABNORMAL LOW (ref 36.0–46.0)
Hemoglobin: 10.3 g/dL — ABNORMAL LOW (ref 12.0–15.0)
MCH: 30.1 pg (ref 26.0–34.0)
MCHC: 30.9 g/dL (ref 30.0–36.0)
MCV: 97.4 fL (ref 80.0–100.0)
Platelets: 122 10*3/uL — ABNORMAL LOW (ref 150–400)
RBC: 3.42 MIL/uL — ABNORMAL LOW (ref 3.87–5.11)
RDW: 14 % (ref 11.5–15.5)
WBC: 4.3 10*3/uL (ref 4.0–10.5)
nRBC: 0 % (ref 0.0–0.2)

## 2022-02-25 LAB — COMPREHENSIVE METABOLIC PANEL
ALT: 14 U/L (ref 0–44)
AST: 12 U/L — ABNORMAL LOW (ref 15–41)
Albumin: 2.4 g/dL — ABNORMAL LOW (ref 3.5–5.0)
Alkaline Phosphatase: 49 U/L (ref 38–126)
Anion gap: 4 — ABNORMAL LOW (ref 5–15)
BUN: 9 mg/dL (ref 8–23)
CO2: 30 mmol/L (ref 22–32)
Calcium: 8.1 mg/dL — ABNORMAL LOW (ref 8.9–10.3)
Chloride: 108 mmol/L (ref 98–111)
Creatinine, Ser: 0.57 mg/dL (ref 0.44–1.00)
GFR, Estimated: >60 mL/min (ref >60)
Glucose, Bld: 202 mg/dL — ABNORMAL HIGH (ref 70–99)
Potassium: 3.3 mmol/L — ABNORMAL LOW (ref 3.5–5.1)
Sodium: 142 mmol/L (ref 135–145)
Total Bilirubin: 0.3 mg/dL (ref 0.3–1.2)
Total Protein: 4.9 g/dL — ABNORMAL LOW (ref 6.5–8.1)

## 2022-02-25 LAB — TROPONIN I (HIGH SENSITIVITY): Troponin I (High Sensitivity): 13 ng/L (ref <18)

## 2022-02-25 LAB — HEMOGLOBIN A1C
Hgb A1c MFr Bld: 7.2 % — ABNORMAL HIGH (ref 4.8–5.6)
Mean Plasma Glucose: 159.94 mg/dL

## 2022-02-25 LAB — GLUCOSE, CAPILLARY
Glucose-Capillary: 136 mg/dL — ABNORMAL HIGH (ref 70–99)
Glucose-Capillary: 136 mg/dL — ABNORMAL HIGH (ref 70–99)

## 2022-02-25 LAB — CK: Total CK: 26 U/L — ABNORMAL LOW (ref 38–234)

## 2022-02-25 MED ORDER — CHLORHEXIDINE GLUCONATE CLOTH 2 % EX PADS
6.0000 | MEDICATED_PAD | Freq: Every day | CUTANEOUS | Status: DC
  Administered 2022-02-25: 6 via TOPICAL

## 2022-02-25 MED ORDER — SODIUM CHLORIDE 0.9% FLUSH
10.0000 mL | Freq: Two times a day (BID) | INTRAVENOUS | Status: DC
  Administered 2022-02-25: 10 mL

## 2022-02-25 MED ORDER — SODIUM CHLORIDE 0.9% FLUSH
10.0000 mL | INTRAVENOUS | Status: DC | PRN
  Administered 2022-02-25 (×2): 10 mL

## 2022-02-25 MED ORDER — SODIUM CHLORIDE 0.9% FLUSH: 10.0000 mL | INTRAVENOUS | Status: DC | PRN

## 2022-02-25 MED ORDER — HEPARIN SOD (PORK) LOCK FLUSH 100 UNIT/ML IV SOLN
500.0000 [IU] | INTRAVENOUS | Status: AC | PRN
  Administered 2022-02-25: 500 [IU]

## 2022-02-25 MED ORDER — POTASSIUM CHLORIDE 10 MEQ/100ML IV SOLN
10.0000 meq | INTRAVENOUS | Status: AC
  Administered 2022-02-25 (×2): 10 meq via INTRAVENOUS
  Filled 2022-02-25 (×2): qty 100

## 2022-02-25 MED ORDER — POTASSIUM CHLORIDE CRYS ER 20 MEQ PO TBCR: 20.0000 meq | EXTENDED_RELEASE_TABLET | Freq: Every day | ORAL | 0 refills | Status: AC

## 2022-02-25 NOTE — Plan of Care (Signed)

## 2022-02-25 NOTE — Evaluation (Signed)
Occupational Therapy Evaluation Patient Details Name: [name] MRN: [id number] DOB: 11-27-1934 Today's Date: 02/25/2022   History of Present Illness 87 y.o. F admitted on 02/24/22 due to abnormal chet pain, found to be hypokalemic. PMH significant of LUNG CA w mets to brain, CAD, pacemaker placement, CHF EF 20%, COPD   Clinical Impression   Pt admitted for concerns listed above. PTA pt reported that she was getting help from family at home for ADL tasks and mobility when she was too tired to do them. At this time, pt presents near her baseline, able to complete functional mobility and transfers with min guard for safety and up to min A for BADL's. Balance overall is fair. She has no further skilled OT needs and acute OT will sign off.       Recommendations for follow up therapy are one component of a multi-disciplinary discharge planning process, led by the attending physician.  Recommendations may be updated based on patient status, additional functional criteria and insurance authorization.   Follow Up Recommendations  No OT follow up    Assistance Recommended at Discharge Set up Supervision/Assistance  Patient can return home with the following A little help with walking and/or transfers;A little help with bathing/dressing/bathroom;Assistance with cooking/housework;Direct supervision/assist for medications management;Direct supervision/assist for financial management;Help with stairs or ramp for entrance    Functional Status Assessment  Patient has not had a recent decline in their functional status  Equipment Recommendations  None recommended by OT    Recommendations for Other Services       Precautions / Restrictions Precautions Precautions: Fall Restrictions Weight Bearing Restrictions: No      Mobility Bed Mobility Overal bed mobility: Modified Independent             General bed mobility comments: increased time    Transfers Overall transfer level: Needs  assistance Equipment used: Rolling walker (2 wheels) Transfers: Sit to/from Stand Sit to Stand: Min guard           General transfer comment: Pt requiring cuing for hand placement as she attempts to pull up on RW, min guard for safety      Balance Overall balance assessment: Mild deficits observed, not formally tested                                         ADL either performed or assessed with clinical judgement   ADL Overall ADL's : At baseline                                       General ADL Comments: Pt is able to complete most BADL's with setup/supervision, family assist with all LB/standing ADL tasks     Vision Baseline Vision/History: 1 Wears glasses Ability to See in Adequate Light: 0 Adequate Patient Visual Report: No change from baseline Vision Assessment?: No apparent visual deficits     Perception     Praxis      Pertinent Vitals/Pain Pain Assessment Pain Assessment: No/denies pain     Hand Dominance Right   Extremity/Trunk Assessment Upper Extremity Assessment Upper Extremity Assessment: Generalized weakness   Lower Extremity Assessment Lower Extremity Assessment: Generalized weakness   Cervical / Trunk Assessment Cervical / Trunk Assessment: Kyphotic   Communication Communication Communication: HOH   Cognition Arousal/Alertness: Awake/alert Behavior During  Therapy: WFL for tasks assessed/performed Overall Cognitive Status: No family/caregiver present to determine baseline cognitive functioning                                 General Comments: Pt has mets to the brain from lung cancer, she requires increased cuing, however unsure if that is due tocognition or poor hearing.     General Comments  VSS on RA    Exercises     Shoulder Instructions      Home Living Family/patient expects to be discharged to:: Private residence Living Arrangements: Children Available Help at Discharge:  Family;Available 24 hours/day Type of Home: House Home Access: Stairs to enter [employer] of Steps: 2 Entrance Stairs-Rails: None Home Layout: One level     Bathroom Shower/Tub: Teacher, early years/pre: Standard Bathroom Accessibility: Yes How Accessible: Accessible via walker Home Equipment: Rolling Walker (2 wheels);Shower seat;BSC/3in1;Grab bars - tub/shower;Grab bars - toilet          Prior Functioning/Environment Prior Level of Function : Needs assist             Mobility Comments: Uses a Rollator for mobility and close supervision ADLs Comments: Family assist with all tasks as needed        OT Problem List: Decreased strength;Decreased activity tolerance;Impaired balance (sitting and/or standing)      OT Treatment/Interventions:      OT Goals(Current goals can be found in the care plan section) Acute Rehab OT Goals Patient Stated Goal: To go home OT Goal Formulation: With patient Time For Goal Achievement: 02/25/22 Potential to Achieve Goals: Good  OT Frequency:      Co-evaluation              AM-PAC OT "6 Clicks" Daily Activity     Outcome Measure Help from another person eating meals?: None Help from another person taking care of personal grooming?: A Little Help from another person toileting, which includes using toliet, bedpan, or urinal?: A Little Help from another person bathing (including washing, rinsing, drying)?: A Little Help from another person to put on and taking off regular upper body clothing?: None Help from another person to put on and taking off regular lower body clothing?: A Little 6 Click Score: 20   End of Session Equipment Utilized During Treatment: Gait belt;Rolling walker (2 wheels) Nurse Communication: Mobility status  Activity Tolerance: Patient tolerated treatment well Patient left: in chair;with call bell/phone within reach  OT Visit Diagnosis: Unsteadiness on feet (R26.81);Other abnormalities  of gait and mobility (R26.89);Muscle weakness (generalized) (M62.81)                Time: 6389-3734 OT Time Calculation (min): 18 min Charges:  OT General Charges $OT Visit: 1 Visit OT Evaluation $OT Eval Low Complexity: [place]., OTR/L Acute Rehabilitation  Keon Benscoter Elane Yolanda Bonine 02/25/2022, 12:01 PM

## 2022-02-25 NOTE — Plan of Care (Signed)

## 2022-02-25 NOTE — Progress Notes (Signed)
Ms. Wimmer is currently and active patient with [facility].  Please feel free to call us directly at [phone] in regards to her care.   Lin Landsman, RN BSN [place].

## 2022-02-25 NOTE — Evaluation (Signed)
Physical Therapy Evaluation Patient Details Name: Corvette Orser MRN: [id number] DOB: [birth year] Today's Date: 02/25/2022  History of Present Illness  87 y.o. F admitted on 02/24/22 due to abnormal chet pain, found to be hypokalemic. 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 She will have adequate supervision and assistance at home;  See below for any follow-up Physical Therapy or equipment needs. PT is signing off. Thank you for this referral.        Recommendations for follow up therapy are one component of a multi-disciplinary discharge planning process, led by the attending physician.  Recommendations may be updated based on patient status, additional functional criteria and insurance authorization.  Follow Up Recommendations Home health PT (Discussed with pt's daughter; HHPT potentially for home safety recommendations when they move inot new place; HHPT is typically arranged as needed through [place])    Assistance Recommended at Discharge Frequent or constant Supervision/Assistance  Patient can return home with the following  A little help with walking and/or transfers;Assistance with cooking/housework;Assist for transportation;Help with stairs or ramp for entrance    Equipment Recommendations None recommended by PT  Recommendations for Other Services  OT consult (as ordered)    Functional Status Assessment Patient has had a recent decline in their functional status and demonstrates the ability to make significant improvements in function in a reasonable and predictable amount of time.     Precautions / Restrictions Precautions Precautions: Fall Restrictions Weight Bearing Restrictions: No       Mobility  Bed Mobility                    Transfers Overall transfer level: Needs assistance Equipment used: Rolling walker (2 wheels) Transfers: Sit to/from Stand Sit to Stand: Min guard, Mod assist           General transfer comment: Pt requiring cuing for hand placement as she attempts to pull up on RW, min guard for safety standing from recliner; mod assist to stand from low toilet    Ambulation/Gait Ambulation/Gait assistance: Min guard, Min assist Gait Distance (Feet): 20 Feet Assistive device: Rolling walker (2 wheels) Gait Pattern/deviations: Step-through pattern, Trunk flexed       General Gait Details: Cues for RW proximity, and navigating small turns to get into and out of the bathroom; Occasional min assist for RW management  Stairs            Wheelchair Mobility    Modified Rankin (Stroke Patients Only)       Balance Overall balance assessment: Mild deficits observed, not formally tested                                           Pertinent Vitals/Pain Pain Assessment Pain Assessment: No/denies pain    Home Living Family/patient expects to be discharged to:: Private residence Living Arrangements: Children Available Help at Discharge: Family;Available 24 hours/day Type of Home: House Home Access: Stairs to enter Entrance Stairs-Rails: None Entrance Stairs-Number of Steps: 2   Home Layout: One level Home Equipment: Conservation officer, nature (2 wheels);Shower seat;BSC/3in1;Grab bars - tub/shower;Grab bars - toilet  Prior Function Prior Level of Function : Needs assist             Mobility Comments: Uses a Rollator for mobility and close supervision ADLs Comments: Family assist with all tasks as needed     Hand Dominance   Dominant Hand: Right    Extremity/Trunk Assessment   Upper Extremity Assessment Upper Extremity Assessment: Defer to OT evaluation    Lower Extremity Assessment Lower Extremity  Assessment: Generalized weakness    Cervical / Trunk Assessment Cervical / Trunk Assessment: Kyphotic  Communication   Communication: HOH  Cognition Arousal/Alertness: Awake/alert Behavior During Therapy: WFL for tasks assessed/performed Overall Cognitive Status: History of cognitive impairments - at baseline                                          General Comments General comments (skin integrity, edema, etc.): No distress on room air    Exercises     Assessment/Plan    PT Assessment All further PT needs can be met in the next venue of care  PT Problem List Decreased strength;Decreased activity tolerance;Decreased balance;Decreased mobility;Decreased coordination;Decreased cognition;Decreased knowledge of use of DME;Decreased safety awareness       PT Treatment Interventions      PT Goals (Current goals can be found in the Care Plan section)  Acute Rehab PT Goals Patient Stated Goal: Home soon PT Goal Formulation: All assessment and education complete, DC therapy    Frequency       Co-evaluation               AM-PAC PT "6 Clicks" Mobility  Outcome Measure Help needed turning from your back to your side while in a flat bed without using bedrails?: None Help needed moving from lying on your back to sitting on the side of a flat bed without using bedrails?: None Help needed moving to and from a bed to a chair (including a wheelchair)?: A Little Help needed standing up from a chair using your arms (e.g., wheelchair or bedside chair)?: A Little Help needed to walk in hospital room?: A Little Help needed climbing 3-5 steps with a railing? : A Lot 6 Click Score: 19    End of Session   Activity Tolerance: Patient tolerated treatment well Patient left: in bed;with call bell/phone within reach;with family/visitor present Nurse Communication: Mobility status PT Visit Diagnosis: Muscle weakness (generalized) (M62.81);Other abnormalities of gait and  mobility (R26.89)    Time: 1031-5945 PT Time Calculation (min) (ACUTE ONLY): 20 min   Charges:   PT Evaluation $PT Eval Low Complexity: [place], PT  Acute Rehabilitation Services Office [phone]   Colletta [place] 02/25/2022, 1:47 PM

## 2022-02-25 NOTE — Discharge Summary (Signed)
PATIENT DETAILS Name: [name] Age: 87 y.o. Sex: female Date of Birth: Sep 13, 1935 MRN: [id number]. Admitting Physician: Toy Baker, MD NWG:[id number], Malka So, MD  Admit Date: 02/24/2022 Discharge date: 02/25/2022  Recommendations for Outpatient Follow-up:  Follow up with PCP in 1-2 weeks   Admitted From:  Home with hospice care  Disposition: Hospice care   Discharge Condition: fair  CODE STATUS:   Code Status: DNR   Diet recommendation:  Diet Order             Diet - low sodium heart healthy           Diet Carb Modified Fluid consistency: Thin; Room service appropriate? Yes  Diet effective now                    Brief Summary: 87 year old with history of metastatic lung CA-presented with atypical chest pain-was found to have severe hypokalemia and admitted overnight for potassium supplementation.  See below for further details.  Brief Hospital Course: Chest pain: Atypical-has resolved overnight.  CTA negative for dissection/PE.  Troponins were negative.  Given that she has metastatic lung CA-under hospice care-doubt further work-up required at this point.  Hypokalemia: Being repleted-should be okay to discharge after repletion is complete.  Follow-up with PCP for repeat chemistry panel.  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  Chronic HFrEF: Euvolemic  COPD: Stable  PAF: Paced rhythm-not a candidate for anticoagulation.  Rest of the medical issues were stable during the short overnight hospitalization.  Stable for discharge-continue hospice care at home.  BMI: Estimated body mass index is 17.19 kg/m as calculated from the following:   Height as of 12/01/21: 5\' 2"  (1.575 m).   Weight as of 12/01/21: 42.6 kg.   Discharge Diagnoses:  Principal Problem:   Hypokalemia Active Problems:   Hypothyroidism   CAD (coronary artery disease), native  coronary artery   Paroxysmal atrial fibrillation (HCC)   COPD (chronic obstructive pulmonary disease) (HCC)   Type 2 diabetes mellitus with other specified complication (HCC)   Chest pain   Small cell lung cancer, right ([place])   Metastatic cancer to brain ([place])   Chronic systolic CHF (congestive heart failure) ([place])   Discharge Instructions:  Activity:  As tolerated   Discharge Instructions     Diet - low sodium heart healthy   Complete by: As directed    Discharge instructions   Complete by: As directed    Follow with Primary MD  [name], [name], MD in 1-2 weeks  Please get a complete blood count and chemistry panel checked by your Primary MD at your next visit, and again as instructed by your Primary MD.  Get Medicines reviewed and adjusted: Please take all your medications with you for your next visit with your Primary MD  Laboratory/radiological data: Please request your Primary MD to go over all hospital tests and procedure/radiological results at the follow up, please ask your Primary MD to get all Hospital records sent to his/her office.  In some cases, they will be blood work, cultures and biopsy results pending at the time of your discharge. Please request that your primary care M.D. follows up on these results.  Also Note the following: If you experience worsening of your admission symptoms, develop shortness of breath, life threatening emergency, suicidal or homicidal thoughts you must seek medical attention immediately by calling 911 or calling your MD  immediately  if symptoms less severe.  You must read complete instructions/literature along with all the possible adverse reactions/side effects for all the Medicines you take and that have been prescribed to you. Take any new Medicines after you have completely understood and accpet all the possible adverse reactions/side effects.   Do not drive when taking Pain medications or sleeping medications  (Benzodaizepines)  Do not take more than prescribed Pain, Sleep and Anxiety Medications. It is not advisable to combine anxiety,sleep and pain medications without talking with your primary care practitioner  Special Instructions: If you have smoked or chewed Tobacco  in the last 2 yrs please stop smoking, stop any regular Alcohol  and or any Recreational drug use.  Wear Seat belts while driving.  Please note: You were cared for by a hospitalist during your hospital stay. Once you are discharged, your primary care physician will handle any further medical issues. Please note that NO REFILLS for any discharge medications will be authorized once you are discharged, as it is imperative that you return to your primary care physician (or establish a relationship with a primary care physician if you do not have one) for your post hospital discharge needs so that they can reassess your need for medications and monitor your lab values.   Increase activity slowly   Complete by: As directed       Allergies as of 02/25/2022       Reactions   Seasonal Ic [cholestatin]    Itchy eye and runny nose        Medication List     STOP taking these medications    Tradjenta 5 MG Tabs tablet Generic drug: linagliptin       TAKE these medications    acetaminophen 500 MG tablet Commonly known as: TYLENOL Take 500 mg by mouth every 6 (six) hours as needed for headache or moderate pain. Rapid release   aerochamber plus with mask inhaler Use as instructed with inahaler.   albuterol (2.5 MG/3ML) 0.083% nebulizer solution Commonly known as: PROVENTIL Take 2.5 mg by nebulization every 6 (six) hours as needed for wheezing or shortness of breath.   carvedilol 3.125 MG tablet Commonly known as: COREG Take 3.125 mg by mouth 2 (two) times daily.   dexamethasone 2 MG tablet Commonly known as: DECADRON Take 2 mg by mouth daily.   dicyclomine 10 MG capsule Commonly known as: BENTYL Take 1 capsule  (10 mg total) by mouth 4 (four) times daily.   IMODIUM PO Take 15-30 mLs by mouth daily as needed (loose stool).   ipratropium-albuterol 0.5-2.5 (3) MG/3ML Soln Commonly known as: DUONEB Take 1.5 mLs by nebulization 3 (three) times daily as needed. What changed: reasons to take this   levothyroxine 112 MCG tablet Commonly known as: SYNTHROID TAKE 1 TABLET BY MOUTH DAILY BEFORE BREAKFAST.   LORazepam 0.5 MG tablet Commonly known as: ATIVAN Take 0.5 mg by mouth daily.   multivitamin with minerals Tabs tablet Take 1 tablet by mouth daily.   nitroGLYCERIN 0.4 MG SL tablet Commonly known as: NITROSTAT Place 1 tablet (0.4 mg total) under the tongue every 5 (five) minutes x 3 doses as needed for chest pain (Max 3 doses within 15 min. Call 911).   OneTouch Verio test strip Generic drug: glucose blood Use to test 3-4 times daily.   oxyCODONE-acetaminophen 10-325 MG tablet Commonly known as: PERCOCET Take 0.5-1 tablets by mouth every 12 (twelve) hours as needed for pain. What changed: how much to take  pantoprazole 40 MG tablet Commonly known as: PROTONIX TAKE 1 TABLET BY MOUTH EVERY DAY   potassium chloride SA 20 MEQ tablet Commonly known as: Klor-Con M20 Take 1 tablet (20 mEq total) by mouth daily. What changed: how much to take   PRESCRIPTION MEDICATION Take 0.25-0.5 mLs by mouth daily as needed (Shortness of breath/pain). MORPHINE SULF 100 MG/5 ML CONC   prochlorperazine 10 MG tablet Commonly known as: COMPAZINE Take 1 tablet (10 mg total) by mouth every 6 (six) hours as needed for nausea or vomiting.   QUEtiapine 25 MG tablet Commonly known as: SEROQUEL Take 12.5 mg by mouth 2 (two) times daily.        Follow-up Information     [name], [name], MD. Schedule an appointment as soon as possible for a visit in 1 week(s).   Specialty: Family Medicine Contact information: [place] [address] [phone]         Constance Haw,  MD. Schedule an appointment as soon as possible for a visit.   Specialty: Cardiology Why: as needed Contact information: [address] [place] [address] [phone]         [name], MD. Schedule an appointment as soon as possible for a visit.   Specialty: Cardiology Why: as needed Contact information: [address] [place] [address] [phone]                Allergies  Allergen Reactions   Seasonal Ic [Cholestatin]     Itchy eye and runny nose     Other Procedures/Studies: DG Chest Portable 1 View  Result Date: 02/24/2022 CLINICAL DATA:  Chest pain EXAM: PORTABLE CHEST 1 VIEW COMPARISON:  06/21/2021 FINDINGS: Lungs are clear.  No pleural effusion or pneumothorax. The heart is normal in size.  Right chest pacemaker. Left IJ chest port terminating in the upper right atrium. IMPRESSION: No evidence of acute cardiopulmonary disease. Electronically Signed   By: Julian Hy M.D.   On: 02/24/2022 17:18   CT Angio Chest Aorta W and/or Wo Contrast  Result Date: 02/24/2022 CLINICAL DATA:  Acute aortic syndrome suspected EXAM: CT ANGIOGRAPHY CHEST WITH CONTRAST TECHNIQUE: Multidetector CT imaging of the chest was performed using the standard protocol before and during bolus administration of intravenous contrast. Multiplanar CT image reconstructions and MIPs were obtained to evaluate the vascular anatomy. RADIATION DOSE REDUCTION: This exam was performed according to the departmental dose-optimization program which includes automated exposure control, adjustment of the mA and/or kV according to patient size and/or use of iterative reconstruction technique. CONTRAST:  122mL OMNIPAQUE IOHEXOL 350 MG/ML SOLN COMPARISON:  06/21/2021 FINDINGS: Cardiovascular: There is no demonstrable mural hematoma in the noncontrast images of thoracic aorta. There is homogeneous enhancement in the thoracic aorta. There are scattered atherosclerotic  plaques and calcifications in the thoracic aorta. Coronary artery calcifications are seen. There are no intraluminal filling defects in the pulmonary artery branches. Pacemaker battery is seen in the right infraclavicular region. Mediastinum/Nodes: No significant lymphadenopathy seen. Thyroid is difficult to visualize suggesting atrophy or previous removal. Lungs/Pleura: Centrilobular and panlobular emphysema is seen. Increased interstitial markings are seen in the periphery of both lungs, there are small patchy densities in both lower lung fields, more so on the right side. More so in the lower lung fields. Upper Abdomen: There is 2.5 cm fluid density lesion in the posterior midportion of left kidney suggesting renal cyst. There is reflux of contrast into hepatic veins  suggesting possible tricuspid incompetence. Musculoskeletal: Unremarkable. Review of the MIP images confirms the above findings. IMPRESSION: There is no evidence of thoracic aortic dissection. Major branches of thoracic aorta appear patent. Coronary artery calcifications are seen. There is no evidence of pulmonary artery embolism. There is no focal pulmonary consolidation. Centrilobular and panlobular emphysema. There are increased interstitial markings in the lower lung fields suggesting interval progression of pulmonary fibrosis or suggest superimposed interstitial pneumonia. Other findings as described in the body of the report. Electronically Signed   By: Elmer Picker M.D.   On: 02/24/2022 20:15     TODAY-DAY OF DISCHARGE:  Subjective:   [name] today has no headache,no chest abdominal pain,no new weakness tingling or numbness, feels much better wants to go home today.   Objective:   Blood pressure (!) 144/55, pulse 72, temperature 98.1 F (36.7 C), temperature source Oral, resp. rate 18, SpO2 100 %.  Intake/Output Summary (Last 24 hours) at 02/25/2022 0919 Last data filed at 02/25/2022 5462 Gross per 24 hour  Intake  352.81 ml  Output 400 ml  Net -47.19 ml   There were no vitals filed for this visit.  Exam: Awake Alert, Oriented *3, No new F.N deficits, Normal affect [place].AT,PERRAL Supple Neck,No JVD, No cervical lymphadenopathy appriciated.  Symmetrical Chest wall movement, Good air movement bilaterally, CTAB RRR,No Gallops,Rubs or new Murmurs, No Parasternal Heave +ve B.Sounds, Abd Soft, Non tender, No organomegaly appriciated, No rebound -guarding or rigidity. No Cyanosis, Clubbing or edema, No new Rash or bruise   PERTINENT RADIOLOGIC STUDIES: DG Chest Portable 1 View  Result Date: 02/24/2022 CLINICAL DATA:  Chest pain EXAM: PORTABLE CHEST 1 VIEW COMPARISON:  06/21/2021 FINDINGS: Lungs are clear.  No pleural effusion or pneumothorax. The heart is normal in size.  Right chest pacemaker. Left IJ chest port terminating in the upper right atrium. IMPRESSION: No evidence of acute cardiopulmonary disease. Electronically Signed   By: Julian Hy M.D.   On: 02/24/2022 17:18   CT Angio Chest Aorta W and/or Wo Contrast  Result Date: 02/24/2022 CLINICAL DATA:  Acute aortic syndrome suspected EXAM: CT ANGIOGRAPHY CHEST WITH CONTRAST TECHNIQUE: Multidetector CT imaging of the chest was performed using the standard protocol before and during bolus administration of intravenous contrast. Multiplanar CT image reconstructions and MIPs were obtained to evaluate the vascular anatomy. RADIATION DOSE REDUCTION: This exam was performed according to the departmental dose-optimization program which includes automated exposure control, adjustment of the mA and/or kV according to patient size and/or use of iterative reconstruction technique. CONTRAST:  130mL OMNIPAQUE IOHEXOL 350 MG/ML SOLN COMPARISON:  06/21/2021 FINDINGS: Cardiovascular: There is no demonstrable mural hematoma in the noncontrast images of thoracic aorta. There is homogeneous enhancement in the thoracic aorta. There are scattered atherosclerotic plaques  and calcifications in the thoracic aorta. Coronary artery calcifications are seen. There are no intraluminal filling defects in the pulmonary artery branches. Pacemaker battery is seen in the right infraclavicular region. Mediastinum/Nodes: No significant lymphadenopathy seen. Thyroid is difficult to visualize suggesting atrophy or previous removal. Lungs/Pleura: Centrilobular and panlobular emphysema is seen. Increased interstitial markings are seen in the periphery of both lungs, there are small patchy densities in both lower lung fields, more so on the right side. More so in the lower lung fields. Upper Abdomen: There is 2.5 cm fluid density lesion in the posterior midportion of left kidney suggesting renal cyst. There is reflux of contrast into hepatic veins suggesting possible tricuspid incompetence. Musculoskeletal: Unremarkable. Review of the MIP images confirms  the above findings. IMPRESSION: There is no evidence of thoracic aortic dissection. Major branches of thoracic aorta appear patent. Coronary artery calcifications are seen. There is no evidence of pulmonary artery embolism. There is no focal pulmonary consolidation. Centrilobular and panlobular emphysema. There are increased interstitial markings in the lower lung fields suggesting interval progression of pulmonary fibrosis or suggest superimposed interstitial pneumonia. Other findings as described in the body of the report. Electronically Signed   By: Elmer Picker M.D.   On: 02/24/2022 20:15     PERTINENT LAB RESULTS: CBC: Recent Labs    02/24/22 1728 02/25/22 0355  WBC 5.2 4.3  HGB 11.7* 10.3*  HCT 36.6 33.3*  PLT 117* 122*   CMET CMP     Component Value Date/Time   NA 142 02/25/2022 0355   NA 149 (H) 11/09/2021 1528   K 3.3 (L) 02/25/2022 0355   CL 108 02/25/2022 0355   CO2 30 02/25/2022 0355   GLUCOSE 202 (H) 02/25/2022 0355   BUN 9 02/25/2022 0355   BUN 11 11/09/2021 1528   CREATININE 0.57 02/25/2022 0355    CREATININE 0.65 06/29/2021 0856   CALCIUM 8.1 (L) 02/25/2022 0355   PROT 4.9 (L) 02/25/2022 0355   ALBUMIN 2.4 (L) 02/25/2022 0355   AST 12 (L) 02/25/2022 0355   AST 10 (L) 06/29/2021 0856   ALT 14 02/25/2022 0355   ALT 7 06/29/2021 0856   ALKPHOS 49 02/25/2022 0355   BILITOT 0.3 02/25/2022 0355   BILITOT 0.7 06/29/2021 0856   GFRNONAA >60 02/25/2022 0355   GFRNONAA >60 06/29/2021 0856   GFRAA >60 06/27/2020 0924    GFR CrCl cannot be calculated (Unknown ideal weight.). Recent Labs    02/24/22 1728  LIPASE 20   Recent Labs    02/25/22 0355  CKTOTAL 26*   Invalid input(s): POCBNP Recent Labs    02/24/22 1728  DDIMER 0.47   Recent Labs    02/25/22 0355  HGBA1C 7.2*   No results for input(s): CHOL, HDL, LDLCALC, TRIG, CHOLHDL, LDLDIRECT in the last 72 hours. No results for input(s): TSH, T4TOTAL, T3FREE, THYROIDAB in the last 72 hours.  Invalid input(s): FREET3 No results for input(s): VITAMINB12, FOLATE, FERRITIN, TIBC, IRON, RETICCTPCT in the last 72 hours. Coags: No results for input(s): INR in the last 72 hours.  Invalid input(s): PT Microbiology: No results found for this or any previous visit (from the past 240 hour(s)).  FURTHER DISCHARGE INSTRUCTIONS:  Get Medicines reviewed and adjusted: Please take all your medications with you for your next visit with your Primary MD  Laboratory/radiological data: Please request your Primary MD to go over all hospital tests and procedure/radiological results at the follow up, please ask your Primary MD to get all Hospital records sent to his/her office.  In some cases, they will be blood work, cultures and biopsy results pending at the time of your discharge. Please request that your primary care M.D. goes through all the records of your hospital data and follows up on these results.  Also Note the following: If you experience worsening of your admission symptoms, develop shortness of breath, life threatening  emergency, suicidal or homicidal thoughts you must seek medical attention immediately by calling 911 or calling your MD immediately  if symptoms less severe.  You must read complete instructions/literature along with all the possible adverse reactions/side effects for all the Medicines you take and that have been prescribed to you. Take any new Medicines after you have completely understood and  accpet all the possible adverse reactions/side effects.   Do not drive when taking Pain medications or sleeping medications (Benzodaizepines)  Do not take more than prescribed Pain, Sleep and Anxiety Medications. It is not advisable to combine anxiety,sleep and pain medications without talking with your primary care practitioner  Special Instructions: If you have smoked or chewed Tobacco  in the last 2 yrs please stop smoking, stop any regular Alcohol  and or any Recreational drug use.  Wear Seat belts while driving.  Please note: You were cared for by a hospitalist during your hospital stay. Once you are discharged, your primary care physician will handle any further medical issues. Please note that NO REFILLS for any discharge medications will be authorized once you are discharged, as it is imperative that you return to your primary care physician (or establish a relationship with a primary care physician if you do not have one) for your post hospital discharge needs so that they can reassess your need for medications and monitor your lab values.  Total Time spent coordinating discharge including counseling, education and face to face time equals less than 30 minutes.  SignedOren Binet 02/25/2022 9:19 AM

## 2022-02-25 NOTE — Progress Notes (Signed)
At 2341, paged Dr. Hal Hope, as relative told this nurse that patient is only comfort care and just came for potassium correction, they are also refusing blood sugar monitoring every 4 hours.   At [place], phlebotomist came to draw labs, daughter reiterate again that patient is only comfort care and does not need all pending labs for her, informed Dr. Hal Hope.

## 2022-03-06 DIAGNOSIS — R410 Disorientation, unspecified: Secondary | ICD-10-CM | POA: Diagnosis not present

## 2022-03-06 DIAGNOSIS — Z743 Need for continuous supervision: Secondary | ICD-10-CM | POA: Diagnosis not present

## 2022-03-07 ENCOUNTER — Telehealth: Payer: Self-pay | Admitting: Cardiology

## 2022-03-07 NOTE — Telephone Encounter (Signed)
FYI--Patient's daughter called and cancelled 6/09 PPM appointment for post-changeout due to patient no longer being able to leave the house. Patient's daughter states she is bed ridden and currently transitioning.

## 2022-03-09 ENCOUNTER — Encounter: Payer: Medicare Other | Admitting: Cardiology

## 2022-03-13 ENCOUNTER — Encounter: Payer: Self-pay | Admitting: Internal Medicine

## 2022-03-31 ENCOUNTER — Other Ambulatory Visit: Payer: Self-pay

## 2022-03-31 ENCOUNTER — Inpatient Hospital Stay (HOSPITAL_COMMUNITY)
Admission: EM | Admit: 2022-03-31 | Discharge: 2022-05-01 | DRG: 951 | Disposition: E | Attending: Family Medicine | Admitting: Family Medicine

## 2022-03-31 ENCOUNTER — Emergency Department (HOSPITAL_COMMUNITY)

## 2022-03-31 DIAGNOSIS — R000 Tachycardia, unspecified: Secondary | ICD-10-CM | POA: Diagnosis not present

## 2022-03-31 DIAGNOSIS — E860 Dehydration: Secondary | ICD-10-CM | POA: Diagnosis not present

## 2022-03-31 DIAGNOSIS — K111 Hypertrophy of salivary gland: Secondary | ICD-10-CM | POA: Diagnosis not present

## 2022-03-31 DIAGNOSIS — R627 Adult failure to thrive: Secondary | ICD-10-CM | POA: Diagnosis not present

## 2022-03-31 DIAGNOSIS — Z923 Personal history of irradiation: Secondary | ICD-10-CM

## 2022-03-31 DIAGNOSIS — Z8249 Family history of ischemic heart disease and other diseases of the circulatory system: Secondary | ICD-10-CM

## 2022-03-31 DIAGNOSIS — R0902 Hypoxemia: Secondary | ICD-10-CM | POA: Diagnosis not present

## 2022-03-31 DIAGNOSIS — Z681 Body mass index (BMI) 19 or less, adult: Secondary | ICD-10-CM | POA: Diagnosis not present

## 2022-03-31 DIAGNOSIS — Z66 Do not resuscitate: Secondary | ICD-10-CM | POA: Diagnosis not present

## 2022-03-31 DIAGNOSIS — E039 Hypothyroidism, unspecified: Secondary | ICD-10-CM | POA: Diagnosis present

## 2022-03-31 DIAGNOSIS — I480 Paroxysmal atrial fibrillation: Secondary | ICD-10-CM

## 2022-03-31 DIAGNOSIS — I4821 Permanent atrial fibrillation: Secondary | ICD-10-CM | POA: Diagnosis not present

## 2022-03-31 DIAGNOSIS — E1169 Type 2 diabetes mellitus with other specified complication: Secondary | ICD-10-CM | POA: Diagnosis not present

## 2022-03-31 DIAGNOSIS — C3490 Malignant neoplasm of unspecified part of unspecified bronchus or lung: Secondary | ICD-10-CM | POA: Diagnosis not present

## 2022-03-31 DIAGNOSIS — C3491 Malignant neoplasm of unspecified part of right bronchus or lung: Secondary | ICD-10-CM | POA: Diagnosis not present

## 2022-03-31 DIAGNOSIS — Z950 Presence of cardiac pacemaker: Secondary | ICD-10-CM | POA: Diagnosis not present

## 2022-03-31 DIAGNOSIS — Z79899 Other long term (current) drug therapy: Secondary | ICD-10-CM

## 2022-03-31 DIAGNOSIS — K1121 Acute sialoadenitis: Secondary | ICD-10-CM | POA: Diagnosis not present

## 2022-03-31 DIAGNOSIS — R64 Cachexia: Secondary | ICD-10-CM | POA: Diagnosis not present

## 2022-03-31 DIAGNOSIS — C7931 Secondary malignant neoplasm of brain: Secondary | ICD-10-CM | POA: Diagnosis not present

## 2022-03-31 DIAGNOSIS — K1120 Sialoadenitis, unspecified: Secondary | ICD-10-CM | POA: Diagnosis not present

## 2022-03-31 DIAGNOSIS — E119 Type 2 diabetes mellitus without complications: Secondary | ICD-10-CM | POA: Diagnosis present

## 2022-03-31 DIAGNOSIS — Z515 Encounter for palliative care: Secondary | ICD-10-CM | POA: Diagnosis not present

## 2022-03-31 DIAGNOSIS — R6889 Other general symptoms and signs: Secondary | ICD-10-CM | POA: Diagnosis not present

## 2022-03-31 DIAGNOSIS — J9611 Chronic respiratory failure with hypoxia: Secondary | ICD-10-CM | POA: Diagnosis present

## 2022-03-31 DIAGNOSIS — Z955 Presence of coronary angioplasty implant and graft: Secondary | ICD-10-CM

## 2022-03-31 DIAGNOSIS — I495 Sick sinus syndrome: Secondary | ICD-10-CM | POA: Diagnosis not present

## 2022-03-31 DIAGNOSIS — Z9981 Dependence on supplemental oxygen: Secondary | ICD-10-CM

## 2022-03-31 DIAGNOSIS — Z79890 Hormone replacement therapy (postmenopausal): Secondary | ICD-10-CM

## 2022-03-31 DIAGNOSIS — J42 Unspecified chronic bronchitis: Secondary | ICD-10-CM | POA: Diagnosis not present

## 2022-03-31 DIAGNOSIS — M2602 Maxillary hypoplasia: Secondary | ICD-10-CM | POA: Diagnosis not present

## 2022-03-31 DIAGNOSIS — L03221 Cellulitis of neck: Secondary | ICD-10-CM | POA: Diagnosis not present

## 2022-03-31 DIAGNOSIS — I2510 Atherosclerotic heart disease of native coronary artery without angina pectoris: Secondary | ICD-10-CM | POA: Diagnosis not present

## 2022-03-31 DIAGNOSIS — J449 Chronic obstructive pulmonary disease, unspecified: Secondary | ICD-10-CM | POA: Diagnosis present

## 2022-03-31 DIAGNOSIS — I5022 Chronic systolic (congestive) heart failure: Secondary | ICD-10-CM | POA: Diagnosis present

## 2022-03-31 DIAGNOSIS — I10 Essential (primary) hypertension: Secondary | ICD-10-CM | POA: Diagnosis not present

## 2022-03-31 DIAGNOSIS — J439 Emphysema, unspecified: Secondary | ICD-10-CM | POA: Diagnosis present

## 2022-03-31 DIAGNOSIS — F17210 Nicotine dependence, cigarettes, uncomplicated: Secondary | ICD-10-CM | POA: Diagnosis present

## 2022-03-31 DIAGNOSIS — Z743 Need for continuous supervision: Secondary | ICD-10-CM | POA: Diagnosis not present

## 2022-03-31 DIAGNOSIS — N179 Acute kidney failure, unspecified: Secondary | ICD-10-CM | POA: Diagnosis present

## 2022-03-31 DIAGNOSIS — Z7952 Long term (current) use of systemic steroids: Secondary | ICD-10-CM

## 2022-03-31 DIAGNOSIS — I110 Hypertensive heart disease with heart failure: Secondary | ICD-10-CM | POA: Diagnosis present

## 2022-03-31 DIAGNOSIS — I499 Cardiac arrhythmia, unspecified: Secondary | ICD-10-CM | POA: Diagnosis not present

## 2022-03-31 LAB — BASIC METABOLIC PANEL
Anion gap: 23 — ABNORMAL HIGH (ref 5–15)
BUN: 42 mg/dL — ABNORMAL HIGH (ref 8–23)
CO2: 12 mmol/L — ABNORMAL LOW (ref 22–32)
Calcium: 9 mg/dL (ref 8.9–10.3)
Chloride: 109 mmol/L (ref 98–111)
Creatinine, Ser: 1.48 mg/dL — ABNORMAL HIGH (ref 0.44–1.00)
GFR, Estimated: 35 mL/min — ABNORMAL LOW (ref >60)
Glucose, Bld: 213 mg/dL — ABNORMAL HIGH (ref 70–99)
Potassium: 5 mmol/L (ref 3.5–5.1)
Sodium: 144 mmol/L (ref 135–145)

## 2022-03-31 MED ORDER — SODIUM CHLORIDE 0.9 % IV BOLUS
1000.0000 mL | Freq: Once | INTRAVENOUS | Status: AC
  Administered 2022-03-31: 1000 mL via INTRAVENOUS

## 2022-03-31 MED ORDER — ONDANSETRON HCL 4 MG/2ML IJ SOLN
4.0000 mg | Freq: Once | INTRAMUSCULAR | Status: AC
  Administered 2022-03-31: 4 mg via INTRAVENOUS
  Filled 2022-03-31: qty 2

## 2022-03-31 MED ORDER — MORPHINE SULFATE (PF) 4 MG/ML IV SOLN
4.0000 mg | Freq: Once | INTRAVENOUS | Status: AC
  Administered 2022-03-31: 4 mg via INTRAVENOUS
  Filled 2022-03-31: qty 1

## 2022-03-31 MED ORDER — SODIUM CHLORIDE 0.9 % IV SOLN: INTRAVENOUS | Status: DC

## 2022-03-31 MED ORDER — SODIUM CHLORIDE 0.9 % IV SOLN
3.0000 g | Freq: Once | INTRAVENOUS | Status: AC
  Administered 2022-03-31: 3 g via INTRAVENOUS
  Filled 2022-03-31: qty 8

## 2022-03-31 NOTE — ED Notes (Signed)
Patient transported to CT 

## 2022-03-31 NOTE — ED Provider Notes (Signed)
[facility] EMERGENCY DEPARTMENT Provider Note   CSN: [id number] Arrival date & time: 04/27/2022  1655     History  Chief Complaint  Patient presents with   Failure To Thrive    [name] is a 87 y.o. female.  Patient is a hospice patient.  Known to have terminal lung CA.  Patient's had significant changes in mental status recently and not doing well with eating or drinking.  Patient was receiving an antibiotic to her eyes.  There was concern that developed about possible allergic reaction they noted swelling to the left side of her face today.  They could not get a hold of the hospice nurse to come out and see her.  And the EMS was called and patient brought in.  Patient is a DNR.  Patient's also been stating for here recently that she is ready to die.  Ready to go home.  In discussion with the family.  They do not want any aggressive measures.  They did want me to evaluate the lump on the side of her face which seem to be in the anatomical distribution of the parotid gland.  Patient also appeared very dehydrated.  Lips and mouth were very dry.  Patient would open her eyes.  But not verbal.  The concern was whether there was an acute allergic reaction.  That is what the family was concerned about.  Patient's family states they just want comfort care.  Did not want any aggressive treatment.  Patient's vital signs on presentation patient tachycardic temp 96.9 patient tachypneic respirations around 20.  Blood pressure is good though around 120/75 oxygen sats originally were 98%.  On 2 L.  Patient does have oxygen at home.  Patient with recent admission May 27.  Patient definitely discharged for hospice care patient was a DNR at that time.  Patient still a DNR.  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  Patient is receiving steroids.  Patient had had radiation treatment earlier.  Patient decided in November she  wanted no further treatments.       Home Medications Prior to Admission medications   Medication Sig Start Date End Date Taking? Authorizing Provider  acetaminophen (TYLENOL) 500 MG tablet Take 500 mg by mouth every 6 (six) hours as needed for headache or moderate pain. Rapid release    [provider]  albuterol (PROVENTIL) (2.5 MG/3ML) 0.083% nebulizer solution Take 2.5 mg by nebulization every 6 (six) hours as needed for wheezing or shortness of breath.    [provider]  carvedilol (COREG) 3.125 MG tablet Take 3.125 mg by mouth 2 (two) times daily. 10/24/21   [provider]  dexamethasone (DECADRON) 2 MG tablet Take 2 mg by mouth daily.    [provider]  dicyclomine (BENTYL) 10 MG capsule Take 1 capsule (10 mg total) by mouth 4 (four) times daily. 01/11/22     glucose blood (ONETOUCH VERIO) test strip Use to test 3-4 times daily. Patient not taking: Reported on 02/24/2022 09/08/19   [name], [name], MD  ipratropium-albuterol (DUONEB) 0.5-2.5 (3) MG/3ML SOLN Take 1.5 mLs by nebulization 3 (three) times daily as needed. Patient taking differently: Take 1.5 mLs by nebulization 3 (three) times daily as needed (Shortness of breath/COPD). 06/27/21   [name], [name], MD  levothyroxine (SYNTHROID) 112 MCG tablet TAKE 1 TABLET BY MOUTH DAILY BEFORE BREAKFAST. Patient taking differently: Take 112 mcg by mouth daily  before breakfast. 08/02/21   [name], [name], MD  Loperamide HCl (IMODIUM PO) Take 15-30 mLs by mouth daily as needed (loose stool).    [provider]  LORazepam (ATIVAN) 0.5 MG tablet Take 0.5 mg by mouth daily. 11/17/21   [provider]  Multiple Vitamin (MULTIVITAMIN WITH MINERALS) TABS tablet Take 1 tablet by mouth daily. 06/23/21   Hongalgi, Lenis Dickinson, MD  nitroGLYCERIN (NITROSTAT) 0.4 MG SL tablet Place 1 tablet (0.4 mg total) under the tongue every 5 (five) minutes x 3 doses as needed for chest pain (Max 3 doses within 15 min. Call  911). 05/26/20   Sherran Needs, NP  oxyCODONE-acetaminophen (PERCOCET) 10-325 MG tablet Take 0.5-1 tablets by mouth every 12 (twelve) hours as needed for pain. Patient taking differently: Take 0.5 tablets by mouth every 12 (twelve) hours as needed for pain. 06/28/21   [name], [name], MD  pantoprazole (PROTONIX) 40 MG tablet TAKE 1 TABLET BY MOUTH EVERY DAY Patient taking differently: Take 40 mg by mouth daily. 03/29/21   [name], [name], MD  potassium chloride SA (KLOR-CON M20) 20 MEQ tablet Take 1 tablet (20 mEq total) by mouth daily. 02/25/22   Ghimire, Henreitta Leber, MD  PRESCRIPTION MEDICATION Take 0.25-0.5 mLs by mouth daily as needed (Shortness of breath/pain). MORPHINE SULF 100 MG/5 ML CONC    [provider]  prochlorperazine (COMPAZINE) 10 MG tablet Take 1 tablet (10 mg total) by mouth every 6 (six) hours as needed for nausea or vomiting. 02/05/20   Curt Bears, MD  QUEtiapine (SEROQUEL) 25 MG tablet Take 12.5 mg by mouth 2 (two) times daily. 11/17/21   [provider]  Spacer/Aero-Holding Chambers (AEROCHAMBER PLUS) inhaler Use as instructed with inahaler. 04/26/21   [name], [name], MD      Allergies    Seasonal ic [cholestatin]    Review of Systems   Review of Systems  Unable to perform ROS: Mental status change    Physical Exam Updated Vital Signs BP 120/75   Pulse 76   Temp (!) 96.9 F (36.1 C) (Oral)   Resp 19   LMP  (LMP Unknown)   SpO2 (!) 87%  Physical Exam Constitutional:      Appearance: She is ill-appearing.     Comments: Patient very ill appearing.  Patient cachectic.  HENT:     Head:     Comments: Swelling to the left side of the face that seems to correlate with the parotid gland distribution.  Does appear to be tender to palpation patient winces.    Mouth/Throat:     Mouth: Mucous membranes are dry.     Comments: Mucous membranes extremely dry including tongue. Eyes:     Comments: Sclera little erythematous.  Right greater than left.   Pulmonary:     Effort: Respiratory distress present.     Comments: Patient with Port-A-Cath left anterior chest. Abdominal:     General: Abdomen is flat. There is no distension.     Tenderness: There is no abdominal tenderness.  Musculoskeletal:        General: No swelling.     Cervical back: Neck supple.     Right lower leg: No edema.     Left lower leg: No edema.  Lymphadenopathy:     Cervical: No cervical adenopathy.  Skin:    General: Skin is warm.  Neurological:     Comments: Patient with decreased mental status.  Will open her eyes.  Will respond some to her  name.     ED Results / Procedures / Treatments   Labs (all labs ordered are listed, but only abnormal results are displayed) Labs Reviewed  BASIC METABOLIC PANEL - Abnormal; Notable for the following components:      Result Value   CO2 12 (*)    Glucose, Bld 213 (*)    BUN 42 (*)    Creatinine, Ser 1.48 (*)    GFR, Estimated 35 (*)    Anion gap 23 (*)    All other components within normal limits  CBC WITH DIFFERENTIAL/PLATELET  CBC WITH DIFFERENTIAL/PLATELET    EKG EKG Interpretation  Date/Time:  Saturday March 31 2022 17:06:49 EDT Ventricular Rate:  129 PR Interval:  145 QRS Duration: 99 QT Interval:  330 QTC Calculation: 484 R Axis:   82 Text Interpretation: Atrial fibrillation Borderline right axis deviation Repolarization abnormality, prob rate related May be sinus tachycardia with a regular rate. Confirmed by Fredia Sorrow [phone]) on 04/11/2022 7:06:51 PM  Radiology CT Soft Tissue Neck Wo Contrast  Result Date: 04/12/2022 CLINICAL DATA:  Initial evaluation for acute soft tissue swelling. EXAM: CT NECK WITHOUT CONTRAST TECHNIQUE: Multidetector CT imaging of the neck was performed following the standard protocol without intravenous contrast. RADIATION DOSE REDUCTION: This exam was performed according to the departmental dose-optimization program which includes automated exposure control, adjustment of  the mA and/or kV according to patient size and/or use of iterative reconstruction technique. COMPARISON:  None Available. FINDINGS: Pharynx and larynx: Oral cavity within normal limits. Mild asymmetric swelling at the left oropharynx/nasopharynx related to the inflammatory process within the left face/neck. Associated trace retropharyngeal effusion without retropharyngeal abscess. Epiglottis grossly within normal limits. Remainder of the hypopharynx and supraglottic larynx within normal limits. Glottis grossly symmetric and normal. Subglottic airway patent clear. Salivary glands: Right parotid and submandibular glands are within normal limits. On the left, there is asymmetric enlargement with hyperattenuation of the left parotid gland, suggesting acute parotitis. No obstructive stone or loculated abscess. Punctate nonobstructive sialolith noted at the deep lobe of the left parotid. Associated swelling with inflammatory stranding throughout the adjacent left face and neck, consistent with associated regional cellulitis. Inflammatory stranding extends inferiorly into the left submandibular space, partially surrounding the left submandibular gland. No loculated or drainable fluid collections seen elsewhere within the neck. Thyroid: Thyroid is either hypoplastic or absent. Lymph nodes: Mild asymmetric prominence of subcentimeter upper left cervical lymph nodes, presumably reactive. No other concerning adenopathy within the neck. Vascular: Advanced atheromatous change present about the carotid bifurcations and visualized carotid siphons. Evaluation for vascular patency limited by lack of IV contrast. Left-sided central venous catheter partially visualized. Limited intracranial: Unremarkable. Visualized orbits: Ocular senescent calcifications noted. Otherwise unremarkable. Mastoids and visualized paranasal sinuses: Partially visualized paranasal sinuses are largely clear. Hypoplastic left maxillary sinus noted. Bilateral  mastoid and middle ear effusions. No visible coalescence. Skeleton: No discrete or worrisome osseous lesions. Patient is edentulous. Mild for age spondylosis. Upper chest: Severe emphysema noted within the visualized lungs. Visualized upper chest demonstrates no other acute finding. Other: None. IMPRESSION: 1. Findings consistent with acute left parotitis. No obstructive stone or loculated abscess. Associated regional cellulitis throughout the adjacent left face and neck. 2. Bilateral mastoid and middle ear effusions, nonspecific, and of uncertain significance. Correlation with physical exam recommended. 3. Emphysema (ICD10-J43.9). Electronically Signed   By: Jeannine Boga M.D.   On: 04/04/2022 21:22    Procedures Procedures    Medications Ordered in ED Medications  0.9 %  sodium  chloride infusion ( Intravenous New Bag/Given 04/15/2022 1937)  0.9 %  sodium chloride infusion (has no administration in time range)  Ampicillin-Sulbactam (UNASYN) 3 g in sodium chloride 0.9 % 100 mL IVPB (has no administration in time range)  sodium chloride 0.9 % bolus 1,000 mL (0 mLs Intravenous Stopped 04/12/2022 1941)  morphine (PF) 4 MG/ML injection 4 mg (4 mg Intravenous Given 04/15/2022 1743)  ondansetron (ZOFRAN) injection 4 mg (4 mg Intravenous Given 04/25/2022 1742)    ED Course/ Medical Decision Making/ A&P                           Medical Decision Making Amount and/or Complexity of Data Reviewed Labs: ordered. Radiology: ordered.  Risk Prescription drug management. Decision regarding hospitalization.   Patient is a DNR.  Patient's family does not want aggressive therapy.  They agreed to some IV fluid hydration since she appeared very dehydrated.  And did want an evaluation with a CT scan of the facial mass that seem to be parotid in nature.  CT scan seems to be consistent with parotid gland without any evidence of obstructing stone.  Could not do the scan with contrast because renal function is consistent  with acute kidney injury.  Basic metabolic panel significant for BUN of 42 creatinine 1.48 CO2 12 potassium good at 5.  CBC was ordered but it clotted.  We opted not to redraw it.  Particularly since family just wanted comfort care.  Patient given some morphine here.  We will going give some Unasyn for the parotid gland.  Patient's condition definitely worsening after receiving the IV fluids patient's lungs may be getting a little bit wet.  Long discussion with family that she may have less than 24 hours to live.  They understand that.  They stated patient had recently told him that she was ready to die.  They have opted to keep her here they do not want to transport her back home.  They would like her admitted here for comfort care.  Patient's oxygen status has gotten a little bit worse.  On the 2 L now she is satting around 92%.  Lungs do sound wet.  Blood pressure is still good.  Patient is little more tachypneic.  Reconfirmed the DNR status and that they just want comfort care.  Will discuss with hospitalist for admission.   Final Clinical Impression(s) / ED Diagnoses Final diagnoses:  Parotitis  Primary malignant neoplasm of lung metastatic to other site, unspecified laterality ([employer])  Dehydration  AKI (acute kidney injury) [facility])    Rx / DC Orders ED Discharge Orders     None         Fredia Sorrow, MD 04/03/2022 2312

## 2022-03-31 NOTE — ED Triage Notes (Signed)
Pt from home d/t AMS. Pt is a hospice pt for brain, lung and bone cancer. Pt is currently on eye infection, family thought she had an allergic reaction after the eyedrop which is not the case. Family c/o declining for the past few weeks... pt is tachy.   134/80 99% RA CBG 298

## 2022-04-01 ENCOUNTER — Encounter (HOSPITAL_COMMUNITY): Payer: Self-pay | Admitting: Family Medicine

## 2022-04-01 DIAGNOSIS — K1120 Sialoadenitis, unspecified: Secondary | ICD-10-CM

## 2022-04-01 DIAGNOSIS — N179 Acute kidney failure, unspecified: Secondary | ICD-10-CM | POA: Diagnosis present

## 2022-04-01 DIAGNOSIS — Z515 Encounter for palliative care: Secondary | ICD-10-CM | POA: Diagnosis not present

## 2022-04-01 DIAGNOSIS — K1121 Acute sialoadenitis: Secondary | ICD-10-CM | POA: Diagnosis not present

## 2022-04-01 MED ORDER — GLYCOPYRROLATE 1 MG PO TABS: 1.0000 mg | ORAL_TABLET | ORAL | Status: DC | PRN

## 2022-04-01 MED ORDER — HALOPERIDOL LACTATE 5 MG/ML IJ SOLN: 0.5000 mg | INTRAMUSCULAR | Status: DC | PRN

## 2022-04-01 MED ORDER — HALOPERIDOL LACTATE 2 MG/ML PO CONC: 0.5000 mg | ORAL | Status: DC | PRN

## 2022-04-01 MED ORDER — GLYCOPYRROLATE 0.2 MG/ML IJ SOLN
0.2000 mg | INTRAMUSCULAR | Status: DC | PRN
  Administered 2022-04-01: 0.2 mg via INTRAVENOUS
  Filled 2022-04-01: qty 1

## 2022-04-01 MED ORDER — POLYVINYL ALCOHOL 1.4 % OP SOLN
1.0000 [drp] | Freq: Four times a day (QID) | OPHTHALMIC | Status: DC | PRN
Start: 2022-04-01 — End: 2022-04-01

## 2022-04-01 MED ORDER — GLYCOPYRROLATE 0.2 MG/ML IJ SOLN: 0.2000 mg | INTRAMUSCULAR | Status: DC | PRN

## 2022-04-01 MED ORDER — BIOTENE DRY MOUTH MT LIQD: 15.0000 mL | OROMUCOSAL | Status: DC | PRN

## 2022-04-01 MED ORDER — ACETAMINOPHEN 325 MG PO TABS: 650.0000 mg | ORAL_TABLET | Freq: Four times a day (QID) | ORAL | Status: DC | PRN

## 2022-04-01 MED ORDER — MORPHINE SULFATE (PF) 2 MG/ML IV SOLN: 1.0000 mg | INTRAVENOUS | Status: DC | PRN

## 2022-04-01 MED ORDER — LORAZEPAM 1 MG PO TABS: 1.0000 mg | ORAL_TABLET | ORAL | Status: DC | PRN

## 2022-04-01 MED ORDER — LORAZEPAM 2 MG/ML PO CONC: 1.0000 mg | ORAL | Status: DC | PRN

## 2022-04-01 MED ORDER — ACETAMINOPHEN 650 MG RE SUPP: 650.0000 mg | Freq: Four times a day (QID) | RECTAL | Status: DC | PRN

## 2022-04-01 MED ORDER — LORAZEPAM 2 MG/ML IJ SOLN: 1.0000 mg | INTRAMUSCULAR | Status: DC | PRN

## 2022-04-01 MED ORDER — ONDANSETRON 4 MG PO TBDP: 4.0000 mg | ORAL_TABLET | Freq: Four times a day (QID) | ORAL | Status: DC | PRN

## 2022-04-01 MED ORDER — MORPHINE SULFATE (PF) 2 MG/ML IV SOLN
2.0000 mg | INTRAVENOUS | Status: DC | PRN
  Administered 2022-04-01: 2 mg via INTRAVENOUS
  Filled 2022-04-01: qty 1

## 2022-04-01 MED ORDER — HALOPERIDOL 0.5 MG PO TABS: 0.5000 mg | ORAL_TABLET | ORAL | Status: DC | PRN

## 2022-04-01 MED ORDER — ONDANSETRON HCL 4 MG/2ML IJ SOLN: 4.0000 mg | Freq: Four times a day (QID) | INTRAMUSCULAR | Status: DC | PRN

## 2022-05-01 NOTE — Death Summary Note (Signed)
Death Summary  [name] RSW:[id number] DOB: 03/31/1935 DOA: April 13, 2022  PCP: [name], [name], MD  Admit date: 04/13/22 Date of Death: April 14, 2022 Time of Death: 02:43 Notification: [name], [name], MD notified of death of April 14, 2022   History of present illness:  [name] is a 87 y.o. female with a history of non-small cell lung cancer metastatic to brain, diabetes mellitus, COPD, chronic systolic CHF, atrial fibrillation not anticoagulated, and hypothyroidism, now presenting to the emergency department with left facial swelling and overall decline.  Patient is under the care of hospice at home, had indicated that she did not want to return to the hospital or receive any further medical care, has not been following with oncology any longer but uses Decadron and is on home hospice.  Family noted acute left facial swelling, patient appeared very uncomfortable, they were unable to reach the hospice nurse, and decided to bring her into the emergency department.  She has been rapidly declining overall since the hospital admission just over a month ago, is not eating or drinking much, and has become less responsive.  On arrival to the emergency department, it was apparent that the patient is actively dying.  In discussion with multiple family members, including the patient's son and daughter, they expressed understanding that the patient did not want any further medical care, they would like to respect her wishes, and asked that we do we can to keep the patient comfortable and allow her to die with dignity.  Patient was given glycopyrrolate for secretions, morphine for apparent discomfort and air hunger, and she expired in the presence of her family at 02:43 on 04/14/22.  Final Diagnoses:  1.   Non small cell lung cancer with metastases  2.   Acute parotitis  3.   AKI  4.   PAF  5.   HFrEF 6.   Type II DM    The results of significant diagnostics from this hospitalization (including imaging,  microbiology, ancillary and laboratory) are listed below for reference.    Significant Diagnostic Studies: CT Soft Tissue Neck Wo Contrast  Result Date: 04/13/2022 CLINICAL DATA:  Initial evaluation for acute soft tissue swelling. EXAM: CT NECK WITHOUT CONTRAST TECHNIQUE: Multidetector CT imaging of the neck was performed following the standard protocol without intravenous contrast. RADIATION DOSE REDUCTION: This exam was performed according to the departmental dose-optimization program which includes automated exposure control, adjustment of the mA and/or kV according to patient size and/or use of iterative reconstruction technique. COMPARISON:  None Available. FINDINGS: Pharynx and larynx: Oral cavity within normal limits. Mild asymmetric swelling at the left oropharynx/nasopharynx related to the inflammatory process within the left face/neck. Associated trace retropharyngeal effusion without retropharyngeal abscess. Epiglottis grossly within normal limits. Remainder of the hypopharynx and supraglottic larynx within normal limits. Glottis grossly symmetric and normal. Subglottic airway patent clear. Salivary glands: Right parotid and submandibular glands are within normal limits. On the left, there is asymmetric enlargement with hyperattenuation of the left parotid gland, suggesting acute parotitis. No obstructive stone or loculated abscess. Punctate nonobstructive sialolith noted at the deep lobe of the left parotid. Associated swelling with inflammatory stranding throughout the adjacent left face and neck, consistent with associated regional cellulitis. Inflammatory stranding extends inferiorly into the left submandibular space, partially surrounding the left submandibular gland. No loculated or drainable fluid collections seen elsewhere within the neck. Thyroid: Thyroid is either hypoplastic or absent. Lymph nodes: Mild asymmetric prominence of subcentimeter upper left cervical lymph nodes, presumably  reactive.  No other concerning adenopathy within the neck. Vascular: Advanced atheromatous change present about the carotid bifurcations and visualized carotid siphons. Evaluation for vascular patency limited by lack of IV contrast. Left-sided central venous catheter partially visualized. Limited intracranial: Unremarkable. Visualized orbits: Ocular senescent calcifications noted. Otherwise unremarkable. Mastoids and visualized paranasal sinuses: Partially visualized paranasal sinuses are largely clear. Hypoplastic left maxillary sinus noted. Bilateral mastoid and middle ear effusions. No visible coalescence. Skeleton: No discrete or worrisome osseous lesions. Patient is edentulous. Mild for age spondylosis. Upper chest: Severe emphysema noted within the visualized lungs. Visualized upper chest demonstrates no other acute finding. Other: None. IMPRESSION: 1. Findings consistent with acute left parotitis. No obstructive stone or loculated abscess. Associated regional cellulitis throughout the adjacent left face and neck. 2. Bilateral mastoid and middle ear effusions, nonspecific, and of uncertain significance. Correlation with physical exam recommended. 3. Emphysema (ICD10-J43.9). Electronically Signed   By: Jeannine Boga M.D.   On: 04/21/2022 21:22    Microbiology: No results found for this or any previous visit (from the past 240 hour(s)).   Labs: Basic Metabolic Panel: Recent Labs  Lab 04/02/2022 1713  NA 144  K 5.0  CL 109  CO2 12*  GLUCOSE 213*  BUN 42*  CREATININE 1.48*  CALCIUM 9.0   Liver Function Tests: No results for input(s): "AST", "ALT", "ALKPHOS", "BILITOT", "PROT", "ALBUMIN" in the last 168 hours. No results for input(s): "LIPASE", "AMYLASE" in the last 168 hours. No results for input(s): "AMMONIA" in the last 168 hours. CBC: No results for input(s): "WBC", "NEUTROABS", "HGB", "HCT", "MCV", "PLT" in the last 168 hours. Cardiac Enzymes: No results for input(s): "CKTOTAL",  "CKMB", "CKMBINDEX", "TROPONINI" in the last 168 hours. D-Dimer No results for input(s): "DDIMER" in the last 72 hours. BNP: Invalid input(s): "POCBNP" CBG: No results for input(s): "GLUCAP" in the last 168 hours. Anemia work up No results for input(s): "VITAMINB12", "FOLATE", "FERRITIN", "TIBC", "IRON", "RETICCTPCT" in the last 72 hours. Urinalysis    Component Value Date/Time   COLORURINE YELLOW 01/05/2020 0631   APPEARANCEUR HAZY (A) 01/05/2020 0631   LABSPEC 1.023 01/05/2020 0631   PHURINE 5.0 01/05/2020 0631   GLUCOSEU NEGATIVE 01/05/2020 0631   HGBUR NEGATIVE 01/05/2020 0631   BILIRUBINUR neg 10/05/2020 1107   KETONESUR NEGATIVE 01/05/2020 0631   PROTEINUR Negative 10/05/2020 1107   PROTEINUR 30 (A) 01/05/2020 0631   UROBILINOGEN 0.2 10/05/2020 1107   NITRITE neg 10/05/2020 1107   NITRITE NEGATIVE 01/05/2020 0631   LEUKOCYTESUR Negative 10/05/2020 1107   LEUKOCYTESUR NEGATIVE 01/05/2020 0631   Sepsis Labs No results for input(s): "WBC" in the last 168 hours.  Invalid input(s): "PROCALCITONIN", "LACTICIDVEN"     SIGNED:  Vianne Bulls, MD  [facility] 04-10-22, 4:04 AM Pager   If 7PM-7AM, please contact night-coverage [email or web address] Password [email or web address]

## 2022-05-01 NOTE — ED Notes (Signed)
Discussed with family about visitation policy, this RN and charge RN aware of allowing 3 visitors at a time with opportunity to switch. Family states understanding.

## 2022-05-01 NOTE — ED Notes (Signed)
TOD called at 0243 by this RN and Oceanographer. Unable to palpate pulses and heart/lung sound inaudible.

## 2022-05-01 NOTE — ED Notes (Signed)
TOD EKG strip placed in medical records.

## 2022-05-01 NOTE — H&P (Signed)
History and Physical    [name] QHU:[id number] DOB: 05-Apr-1935 DOA: 04/06/2022  PCP: [name], [name], MD   Patient coming from: Home   Chief Complaint: Facial swelling, not eating or drinking, not responding   HPI: [name] is a pleasant 87 y.o. female with medical history significant for non-small cell lung cancer metastatic to brain, diabetes mellitus, COPD, chronic systolic CHF, atrial fibrillation not anticoagulated, and hypothyroidism, now presenting to the emergency department with left facial swelling and overall decline.  Patient is under the care of hospice at home, had indicated that she did not want to return to the hospital or receive any further medical care, has not been following with oncology any longer but uses [facility] and is on home hospice.  Family noted acute left facial swelling, patient appeared very uncomfortable, they were unable to reach the hospice nurse, and decided to bring her into the emergency department.  She has been rapidly declining overall since the hospital admission just over a month ago, is not eating or drinking much, and has become less responsive.  ED Course: Upon arrival to the ED, patient is found to be afebrile and saturating in the 90s on 2 L/min of supplemental oxygen with stable blood pressure.  EKG features atrial fibrillation with RVR.  CT findings are consistent with acute left parotitis.  Patient was given a liter of saline, Unasyn, morphine, and Zofran in the ED.  In discussion with multiple family members, including the patient's son and daughter, they understand that the patient did not want any further medical care, would like to respect her wishes, and asked that we do what we can to keep the patient comfortable and allow her to die with dignity.  Review of Systems:  All other systems reviewed and apart from HPI, are negative.  Past Medical History:  Diagnosis Date   Anemia    CAD (coronary artery disease), native coronary artery     PTCA of distal right coronary artery 1997 Cypher stents to circumflex 2005 Cardiac cath in 2011 with patent stent to circumflex and moderate disease elsewhere treated medically    Cardiac pacemaker in situ 12/24/2015   Original implant reportedly in 1991 for tachybradycardia syndrome, generator change in 1999, 2004 and evidently again in 2016 in [place]    Chronic systolic CHF (congestive heart failure) (HCC)    COPD (chronic obstructive pulmonary disease) ([place])    Diabetes mellitus without complication ([place])    Hypertension    Hypothyroidism 03/23/2010   Lung cancer ([place])    Pacemaker    Permanent atrial fibrillation ([place]) 03/23/2010   Prolonged Q-T interval on ECG     Past Surgical History:  Procedure Laterality Date   BIOPSY  01/08/2020   Procedure: BIOPSY;  Surgeon: Garner Nash, DO;  Location: [facility];  Service: Pulmonary;;   BRONCHIAL BRUSHINGS  01/08/2020   Procedure: BRONCHIAL BRUSHINGS;  Surgeon: Garner Nash, DO;  Location: [facility];  Service: Pulmonary;;   BRONCHIAL WASHINGS  01/08/2020   Procedure: BRONCHIAL WASHINGS;  Surgeon: Garner Nash, DO;  Location: [facility];  Service: Pulmonary;;   CARDIAC CATHETERIZATION N/A 12/26/2015   Procedure: Left Heart Cath and Coronary Angiography;  Surgeon: Peter M [name], MD;  Location: [facility];  Service: Cardiovascular;  Laterality: N/A;   CARDIOVERSION  05/2015   CHOLECYSTECTOMY  2012   CORONARY ANGIOPLASTY WITH STENT PLACEMENT  1990   ENDOBRONCHIAL ULTRASOUND  01/08/2020   Procedure: ENDOBRONCHIAL ULTRASOUND;  Surgeon: June Leap  L, DO;  Location: [facility];  Service: Pulmonary;;   FINE NEEDLE ASPIRATION BIOPSY  01/08/2020   Procedure: FINE NEEDLE ASPIRATION BIOPSY;  Surgeon: Garner Nash, DO;  Location: [facility];  Service: Pulmonary;;   HERNIA REPAIR     IR IMAGING GUIDED PORT INSERTION  02/18/2020   PACEMAKER GENERATOR CHANGE  [id number], 2016   PACEMAKER INSERTION  1991   PPM GENERATOR  CHANGEOUT N/A 12/01/2021   Procedure: PPM GENERATOR CHANGEOUT;  Surgeon: Constance Haw, MD;  Location: [place] [facility];  Service: Cardiovascular;  Laterality: N/A;   VIDEO BRONCHOSCOPY WITH ENDOBRONCHIAL ULTRASOUND N/A 01/08/2020   Procedure: VIDEO BRONCHOSCOPY;  Surgeon: Garner Nash, DO;  Location: [facility];  Service: Pulmonary;  Laterality: N/A;    Social History:   reports that she has been smoking cigarettes. She has a 16.50 pack-year smoking history. She has never used smokeless tobacco. She reports that she does not drink alcohol and does not use drugs.  Allergies  Allergen Reactions   Seasonal Ic [Cholestatin]     Itchy eye and runny nose    Family History  Problem Relation Age of Onset   Heart disease Father    Heart disease Mother    Heart attack Mother    Cirrhosis Brother    Colon cancer Neg Hx    Stomach cancer Neg Hx      Prior to Admission medications   Medication Sig Start Date End Date Taking? Authorizing Provider  acetaminophen (TYLENOL) 500 MG tablet Take 500 mg by mouth every 6 (six) hours as needed for headache or moderate pain. Rapid release    [provider]  albuterol (PROVENTIL) (2.5 MG/3ML) 0.083% nebulizer solution Take 2.5 mg by nebulization every 6 (six) hours as needed for wheezing or shortness of breath.    [provider]  carvedilol (COREG) 3.125 MG tablet Take 3.125 mg by mouth 2 (two) times daily. 10/24/21   [provider]  dexamethasone (DECADRON) 2 MG tablet Take 2 mg by mouth daily.    [provider]  dicyclomine (BENTYL) 10 MG capsule Take 1 capsule (10 mg total) by mouth 4 (four) times daily. 01/11/22     glucose blood (ONETOUCH VERIO) test strip Use to test 3-4 times daily. Patient not taking: Reported on 02/24/2022 09/08/19   [name], [name], MD  ipratropium-albuterol (DUONEB) 0.5-2.5 (3) MG/3ML SOLN Take 1.5 mLs by nebulization 3 (three) times daily as needed. Patient taking differently:  Take 1.5 mLs by nebulization 3 (three) times daily as needed (Shortness of breath/COPD). 06/27/21   [name], [name], MD  levothyroxine (SYNTHROID) 112 MCG tablet TAKE 1 TABLET BY MOUTH DAILY BEFORE BREAKFAST. Patient taking differently: Take 112 mcg by mouth daily before breakfast. 08/02/21   [name], [name], MD  Loperamide HCl (IMODIUM PO) Take 15-30 mLs by mouth daily as needed (loose stool).    [provider]  LORazepam (ATIVAN) 0.5 MG tablet Take 0.5 mg by mouth daily. 11/17/21   [provider]  Multiple Vitamin (MULTIVITAMIN WITH MINERALS) TABS tablet Take 1 tablet by mouth daily. 06/23/21   Hongalgi, Lenis Dickinson, MD  nitroGLYCERIN (NITROSTAT) 0.4 MG SL tablet Place 1 tablet (0.4 mg total) under the tongue every 5 (five) minutes x 3 doses as needed for chest pain (Max 3 doses within 15 min. Call 911). 05/26/20   Sherran Needs, NP  oxyCODONE-acetaminophen (PERCOCET) 10-325 MG tablet Take 0.5-1 tablets by mouth every 12 (twelve) hours as needed for pain. Patient  taking differently: Take 0.5 tablets by mouth every 12 (twelve) hours as needed for pain. 06/28/21   [name], [name], MD  pantoprazole (PROTONIX) 40 MG tablet TAKE 1 TABLET BY MOUTH EVERY DAY Patient taking differently: Take 40 mg by mouth daily. 03/29/21   [name], [name], MD  potassium chloride SA (KLOR-CON M20) 20 MEQ tablet Take 1 tablet (20 mEq total) by mouth daily. 02/25/22   Ghimire, Henreitta Leber, MD  PRESCRIPTION MEDICATION Take 0.25-0.5 mLs by mouth daily as needed (Shortness of breath/pain). MORPHINE SULF 100 MG/5 ML CONC    [provider]  prochlorperazine (COMPAZINE) 10 MG tablet Take 1 tablet (10 mg total) by mouth every 6 (six) hours as needed for nausea or vomiting. 02/05/20   Curt Bears, MD  QUEtiapine (SEROQUEL) 25 MG tablet Take 12.5 mg by mouth 2 (two) times daily. 11/17/21   [provider]  Spacer/Aero-Holding Chambers (AEROCHAMBER PLUS) inhaler Use as instructed with inahaler. 04/26/21    [name], [name], MD    Physical Exam: Vitals:   04/26/2022 1930 04/23/2022 2115 04/21/2022 2245 04-15-22 0248  BP: 129/70 120/75 120/84   Pulse: (!) 107 76 61   Resp: 13 19 19    Temp:      TempSrc:      SpO2: 98% (!) 87% 96%   Weight:    42.6 kg  Height:    5\' 2"  (1.575 m)    Constitutional: poorly responsive, agonal breathing   Eyes: Mucoid discharge, poorly reactive pupils ENMT: Mucous membranes are dry. Swelling at angle of left mandible.   Neck: supple, no masses  Respiratory: Agonal breathing. Copious secretions.   Cardiovascular: Rate ~120 and irregularly irregular. No significant JVD. Abdomen: No distension, no tenderness, soft. Bowel sounds hypoactive.  Musculoskeletal: no clubbing / cyanosis. No joint deformity upper and lower extremities.   Skin: no significant rashes, lesions, ulcers. Cool, poor turgor.  Neurologic: Obtunded. No verbal response, occasionally opens eyes briefly. Clonic movements involving LUE.    Labs and Imaging on Admission: I have personally reviewed following labs and imaging studies  CBC: No results for input(s): "WBC", "NEUTROABS", "HGB", "HCT", "MCV", "PLT" in the last 168 hours. Basic Metabolic Panel: Recent Labs  Lab 04/15/2022 1713  NA 144  K 5.0  CL 109  CO2 12*  GLUCOSE 213*  BUN 42*  CREATININE 1.48*  CALCIUM 9.0   GFR: Estimated Creatinine Clearance: 19 mL/min (A) (by C-G formula based on SCr of 1.48 mg/dL (H)). Liver Function Tests: No results for input(s): "AST", "ALT", "ALKPHOS", "BILITOT", "PROT", "ALBUMIN" in the last 168 hours. No results for input(s): "LIPASE", "AMYLASE" in the last 168 hours. No results for input(s): "AMMONIA" in the last 168 hours. Coagulation Profile: No results for input(s): "INR", "PROTIME" in the last 168 hours. Cardiac Enzymes: No results for input(s): "CKTOTAL", "CKMB", "CKMBINDEX", "TROPONINI" in the last 168 hours. BNP (last 3 results) No results for input(s): "PROBNP" in the last 8760  hours. HbA1C: No results for input(s): "HGBA1C" in the last 72 hours. CBG: No results for input(s): "GLUCAP" in the last 168 hours. Lipid Profile: No results for input(s): "CHOL", "HDL", "LDLCALC", "TRIG", "CHOLHDL", "LDLDIRECT" in the last 72 hours. Thyroid Function Tests: No results for input(s): "TSH", "T4TOTAL", "FREET4", "T3FREE", "THYROIDAB" in the last 72 hours. Anemia Panel: No results for input(s): "VITAMINB12", "FOLATE", "FERRITIN", "TIBC", "IRON", "RETICCTPCT" in the last 72 hours. Urine analysis:    Component Value Date/Time   COLORURINE YELLOW 01/05/2020 0631   APPEARANCEUR HAZY (A) 01/05/2020  0631   LABSPEC 1.023 01/05/2020 0631   PHURINE 5.0 01/05/2020 0631   GLUCOSEU NEGATIVE 01/05/2020 0631   HGBUR NEGATIVE 01/05/2020 0631   BILIRUBINUR neg 10/05/2020 1107   KETONESUR NEGATIVE 01/05/2020 0631   PROTEINUR Negative 10/05/2020 1107   PROTEINUR 30 (A) 01/05/2020 0631   UROBILINOGEN 0.2 10/05/2020 1107   NITRITE neg 10/05/2020 1107   NITRITE NEGATIVE 01/05/2020 0631   LEUKOCYTESUR Negative 10/05/2020 1107   LEUKOCYTESUR NEGATIVE 01/05/2020 0631   Sepsis Labs: @LABRCNTIP (procalcitonin:4,lacticidven:4) )No results found for this or any previous visit (from the past 240 hour(s)).   Radiological Exams on Admission: CT Soft Tissue Neck Wo Contrast  Result Date: 04/23/2022 CLINICAL DATA:  Initial evaluation for acute soft tissue swelling. EXAM: CT NECK WITHOUT CONTRAST TECHNIQUE: Multidetector CT imaging of the neck was performed following the standard protocol without intravenous contrast. RADIATION DOSE REDUCTION: This exam was performed according to the departmental dose-optimization program which includes automated exposure control, adjustment of the mA and/or kV according to patient size and/or use of iterative reconstruction technique. COMPARISON:  None Available. FINDINGS: Pharynx and larynx: Oral cavity within normal limits. Mild asymmetric swelling at the left  oropharynx/nasopharynx related to the inflammatory process within the left face/neck. Associated trace retropharyngeal effusion without retropharyngeal abscess. Epiglottis grossly within normal limits. Remainder of the hypopharynx and supraglottic larynx within normal limits. Glottis grossly symmetric and normal. Subglottic airway patent clear. Salivary glands: Right parotid and submandibular glands are within normal limits. On the left, there is asymmetric enlargement with hyperattenuation of the left parotid gland, suggesting acute parotitis. No obstructive stone or loculated abscess. Punctate nonobstructive sialolith noted at the deep lobe of the left parotid. Associated swelling with inflammatory stranding throughout the adjacent left face and neck, consistent with associated regional cellulitis. Inflammatory stranding extends inferiorly into the left submandibular space, partially surrounding the left submandibular gland. No loculated or drainable fluid collections seen elsewhere within the neck. Thyroid: Thyroid is either hypoplastic or absent. Lymph nodes: Mild asymmetric prominence of subcentimeter upper left cervical lymph nodes, presumably reactive. No other concerning adenopathy within the neck. Vascular: Advanced atheromatous change present about the carotid bifurcations and visualized carotid siphons. Evaluation for vascular patency limited by lack of IV contrast. Left-sided central venous catheter partially visualized. Limited intracranial: Unremarkable. Visualized orbits: Ocular senescent calcifications noted. Otherwise unremarkable. Mastoids and visualized paranasal sinuses: Partially visualized paranasal sinuses are largely clear. Hypoplastic left maxillary sinus noted. Bilateral mastoid and middle ear effusions. No visible coalescence. Skeleton: No discrete or worrisome osseous lesions. Patient is edentulous. Mild for age spondylosis. Upper chest: Severe emphysema noted within the visualized lungs.  Visualized upper chest demonstrates no other acute finding. Other: None. IMPRESSION: 1. Findings consistent with acute left parotitis. No obstructive stone or loculated abscess. Associated regional cellulitis throughout the adjacent left face and neck. 2. Bilateral mastoid and middle ear effusions, nonspecific, and of uncertain significance. Correlation with physical exam recommended. 3. Emphysema (ICD10-J43.9). Electronically Signed   By: Jeannine Boga M.D.   On: 04/20/2022 21:22    EKG: Independently reviewed. Atrial fibrillation, rate 129.   Assessment/Plan   1. Acute parotitis  - Presents from home where she was on hospice d/t left facial swelling and apparent discomfort with family unable to reach hospice nurse  - She was treated with a dose of antibiotics in ED, appears to be actively dying, and family asks that we provide comfort care only which is in line with the patient's previously stated wishes    2. Metastatic small  cell lung cancer  - No longer following with oncology, on hospice prior to admission   3. AKI  - Acute prerenal azotemia in setting of not eating or drinking  - She was given a liter IVF in ED, family now asking for comfort measures only, will not repeat labs    4. Chronic systolic CHF  - Not eating or drinking lately, appeared hypovolemic and given fluid bolus in ED   5. COPD; chronic hypoxic respiratory failure  - Supplemental O2 and breathing treatments if needed for comfort   6. Type II DM  - No CBG checks, providing comfort care only per pt and family wishes    7. PAF with RVR - In RVR in ED, rate improved with IVF, not a candidate for anticoagulation  - Family agrees to stop cardiac monitoring and focus on comfort only    DVT prophylaxis: none, comfort care only  Code Status: DNR, confirmed with multiple family members on admission  Level of Care: Level of care: Med-Surg Family Communication: Son and granddaughter updated at bedside, daughter  updated by phone  Disposition Plan:  Patient is from: home  Anticipated d/c is to: Anticipate death in hospital Anticipated d/c date is: 04/02/22  Patient currently: Actively dying  Consults called: none  Admission status: Inpatient     Vianne Bulls, MD [facility]  04-22-2022, 3:42 AM

## 2022-05-01 NOTE — ED Notes (Signed)
Chaplain at bedside

## 2022-05-01 DEATH — deceased
